# Patient Record
Sex: Male | Born: 1965
Health system: Southern US, Community
[De-identification: ages and names within clinical notes are randomized; demographics above are authoritative.]

## PROBLEM LIST (undated history)

## (undated) DIAGNOSIS — T7840XA Allergy, unspecified, initial encounter: Secondary | ICD-10-CM

## (undated) DIAGNOSIS — J45909 Unspecified asthma, uncomplicated: Secondary | ICD-10-CM

## (undated) DIAGNOSIS — F32A Depression, unspecified: Secondary | ICD-10-CM

## (undated) DIAGNOSIS — E785 Hyperlipidemia, unspecified: Secondary | ICD-10-CM

## (undated) DIAGNOSIS — K859 Acute pancreatitis without necrosis or infection, unspecified: Secondary | ICD-10-CM

## (undated) DIAGNOSIS — K219 Gastro-esophageal reflux disease without esophagitis: Secondary | ICD-10-CM

## (undated) DIAGNOSIS — I10 Essential (primary) hypertension: Secondary | ICD-10-CM

## (undated) DIAGNOSIS — F259 Schizoaffective disorder, unspecified: Secondary | ICD-10-CM

## (undated) DIAGNOSIS — E119 Type 2 diabetes mellitus without complications: Secondary | ICD-10-CM

## (undated) DIAGNOSIS — F329 Major depressive disorder, single episode, unspecified: Secondary | ICD-10-CM

## (undated) DIAGNOSIS — F419 Anxiety disorder, unspecified: Secondary | ICD-10-CM

## (undated) DIAGNOSIS — M1990 Unspecified osteoarthritis, unspecified site: Secondary | ICD-10-CM

## (undated) DIAGNOSIS — R7303 Prediabetes: Secondary | ICD-10-CM

## (undated) DIAGNOSIS — R42 Dizziness and giddiness: Secondary | ICD-10-CM

## (undated) DIAGNOSIS — R0600 Dyspnea, unspecified: Secondary | ICD-10-CM

## (undated) DIAGNOSIS — J189 Pneumonia, unspecified organism: Secondary | ICD-10-CM

## (undated) DIAGNOSIS — J80 Acute respiratory distress syndrome: Secondary | ICD-10-CM

## (undated) DIAGNOSIS — F319 Bipolar disorder, unspecified: Secondary | ICD-10-CM

## (undated) DIAGNOSIS — J449 Chronic obstructive pulmonary disease, unspecified: Secondary | ICD-10-CM

## (undated) DIAGNOSIS — K760 Fatty (change of) liver, not elsewhere classified: Secondary | ICD-10-CM

## (undated) DIAGNOSIS — I2699 Other pulmonary embolism without acute cor pulmonale: Secondary | ICD-10-CM

## (undated) DIAGNOSIS — E7800 Pure hypercholesterolemia, unspecified: Secondary | ICD-10-CM

## (undated) DIAGNOSIS — N189 Chronic kidney disease, unspecified: Secondary | ICD-10-CM

## (undated) DIAGNOSIS — H269 Unspecified cataract: Secondary | ICD-10-CM

## (undated) DIAGNOSIS — F1010 Alcohol abuse, uncomplicated: Secondary | ICD-10-CM

## (undated) HISTORY — DX: Allergy, unspecified, initial encounter: T78.40XA

## (undated) HISTORY — DX: Anxiety disorder, unspecified: F41.9

## (undated) HISTORY — PX: HERNIA REPAIR: SHX51

## (undated) HISTORY — PX: COLONOSCOPY: SHX174

## (undated) HISTORY — PX: EYE SURGERY: SHX253

## (undated) HISTORY — PX: UPPER GI ENDOSCOPY: SHX6162

## (undated) HISTORY — DX: Chronic kidney disease, unspecified: N18.9

## (undated) HISTORY — PX: STENT REMOVAL: SHX6421

## (undated) HISTORY — DX: Unspecified cataract: H26.9

---
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
## 1999-06-21 ENCOUNTER — Inpatient Hospital Stay (HOSPITAL_COMMUNITY): Admission: AD | Admit: 1999-06-21 | Discharge: 1999-06-28 | Payer: Self-pay | Admitting: *Deleted

## 1999-06-22 ENCOUNTER — Encounter: Payer: Self-pay | Admitting: *Deleted

## 2004-09-05 ENCOUNTER — Ambulatory Visit: Payer: Self-pay | Admitting: Internal Medicine

## 2004-09-20 ENCOUNTER — Inpatient Hospital Stay: Payer: Self-pay | Admitting: Internal Medicine

## 2004-10-01 ENCOUNTER — Other Ambulatory Visit: Payer: Self-pay

## 2004-10-01 ENCOUNTER — Inpatient Hospital Stay: Payer: Self-pay | Admitting: Internal Medicine

## 2004-10-02 ENCOUNTER — Inpatient Hospital Stay: Payer: Self-pay | Admitting: Psychiatry

## 2004-10-05 ENCOUNTER — Ambulatory Visit: Payer: Self-pay | Admitting: Unknown Physician Specialty

## 2004-11-02 ENCOUNTER — Ambulatory Visit: Payer: Self-pay | Admitting: Internal Medicine

## 2004-11-05 ENCOUNTER — Ambulatory Visit: Payer: Self-pay | Admitting: Unknown Physician Specialty

## 2004-11-30 ENCOUNTER — Ambulatory Visit: Payer: Self-pay | Admitting: Internal Medicine

## 2006-09-19 ENCOUNTER — Ambulatory Visit: Payer: Self-pay | Admitting: Internal Medicine

## 2007-01-26 ENCOUNTER — Emergency Department: Payer: Self-pay | Admitting: Emergency Medicine

## 2008-09-01 ENCOUNTER — Emergency Department: Payer: Self-pay | Admitting: Emergency Medicine

## 2008-09-29 ENCOUNTER — Ambulatory Visit: Payer: Self-pay | Admitting: Internal Medicine

## 2010-06-24 ENCOUNTER — Emergency Department: Payer: Self-pay | Admitting: Emergency Medicine

## 2011-07-20 ENCOUNTER — Ambulatory Visit: Payer: Self-pay | Admitting: Internal Medicine

## 2012-04-22 ENCOUNTER — Emergency Department: Payer: Self-pay | Admitting: Emergency Medicine

## 2012-04-22 LAB — URINALYSIS, COMPLETE
Blood: NEGATIVE
Glucose,UR: NEGATIVE mg/dL (ref 0–75)
Leukocyte Esterase: NEGATIVE
Protein: NEGATIVE

## 2012-04-22 LAB — COMPREHENSIVE METABOLIC PANEL
Albumin: 4.2 g/dL (ref 3.4–5.0)
Alkaline Phosphatase: 151 U/L — ABNORMAL HIGH (ref 50–136)
BUN: 5 mg/dL — ABNORMAL LOW (ref 7–18)
Calcium, Total: 9.3 mg/dL (ref 8.5–10.1)
Chloride: 101 mmol/L (ref 98–107)
EGFR (African American): >60
Glucose: 111 mg/dL — ABNORMAL HIGH (ref 65–99)
Osmolality: 274 (ref 275–301)
SGOT(AST): 28 U/L (ref 15–37)
Total Protein: 8.5 g/dL — ABNORMAL HIGH (ref 6.4–8.2)

## 2012-04-22 LAB — CBC
HCT: 48.3 % (ref 40.0–52.0)
HGB: 16.7 g/dL (ref 13.0–18.0)
MCH: 33.1 pg (ref 26.0–34.0)
MCV: 96 fL (ref 80–100)
Platelet: 209 10*3/uL (ref 150–440)
RDW: 14.2 % (ref 11.5–14.5)

## 2012-04-22 LAB — LIPASE, BLOOD: Lipase: 177 U/L (ref 73–393)

## 2012-04-25 ENCOUNTER — Emergency Department: Payer: Self-pay | Admitting: Emergency Medicine

## 2012-04-25 LAB — CBC WITH DIFFERENTIAL/PLATELET
Basophil #: 0 10*3/uL (ref 0.0–0.1)
Eosinophil #: 0.1 10*3/uL (ref 0.0–0.7)
HCT: 47 % (ref 40.0–52.0)
HGB: 15.9 g/dL (ref 13.0–18.0)
Lymphocyte %: 14 %
MCH: 32.8 pg (ref 26.0–34.0)
MCHC: 33.9 g/dL (ref 32.0–36.0)
Monocyte #: 0.5 x10 3/mm (ref 0.2–1.0)
Monocyte %: 4.9 %
Neutrophil %: 79.6 %
Platelet: 182 10*3/uL (ref 150–440)
RDW: 14 % (ref 11.5–14.5)

## 2012-04-25 LAB — COMPREHENSIVE METABOLIC PANEL
Alkaline Phosphatase: 126 U/L (ref 50–136)
BUN: 6 mg/dL — ABNORMAL LOW (ref 7–18)
Calcium, Total: 9.1 mg/dL (ref 8.5–10.1)
Co2: 24 mmol/L (ref 21–32)
Creatinine: 0.73 mg/dL (ref 0.60–1.30)
EGFR (African American): >60
Glucose: 106 mg/dL — ABNORMAL HIGH (ref 65–99)
Potassium: 3.9 mmol/L (ref 3.5–5.1)
SGOT(AST): 39 U/L — ABNORMAL HIGH (ref 15–37)
SGPT (ALT): 44 U/L
Sodium: 135 mmol/L — ABNORMAL LOW (ref 136–145)

## 2012-04-25 LAB — LIPASE, BLOOD: Lipase: 168 U/L (ref 73–393)

## 2012-05-09 ENCOUNTER — Ambulatory Visit: Payer: Self-pay | Admitting: Unknown Physician Specialty

## 2012-05-12 LAB — PATHOLOGY REPORT

## 2012-06-30 ENCOUNTER — Ambulatory Visit: Payer: Self-pay | Admitting: Unknown Physician Specialty

## 2012-07-01 LAB — PATHOLOGY REPORT

## 2012-09-01 ENCOUNTER — Emergency Department: Payer: Self-pay | Admitting: Emergency Medicine

## 2012-09-01 LAB — ACETAMINOPHEN LEVEL: Acetaminophen: <2 ug/mL

## 2012-09-01 LAB — DRUG SCREEN, URINE

## 2012-09-01 LAB — SALICYLATE LEVEL: Salicylates, Serum: 3.6 mg/dL — ABNORMAL HIGH

## 2012-09-01 LAB — COMPREHENSIVE METABOLIC PANEL
Albumin: 4.1 g/dL (ref 3.4–5.0)
Alkaline Phosphatase: 116 U/L (ref 50–136)
Anion Gap: 10 (ref 7–16)
BUN: 7 mg/dL (ref 7–18)
Bilirubin,Total: 0.3 mg/dL (ref 0.2–1.0)
Calcium, Total: 8.8 mg/dL (ref 8.5–10.1)
Chloride: 104 mmol/L (ref 98–107)
Co2: 26 mmol/L (ref 21–32)
Creatinine: 0.75 mg/dL (ref 0.60–1.30)
EGFR (African American): >60
EGFR (Non-African Amer.): >60
Glucose: 97 mg/dL (ref 65–99)
Osmolality: 277 (ref 275–301)
Potassium: 3.6 mmol/L (ref 3.5–5.1)
SGOT(AST): 22 U/L (ref 15–37)
SGPT (ALT): 31 U/L (ref 12–78)
Sodium: 140 mmol/L (ref 136–145)
Total Protein: 7.5 g/dL (ref 6.4–8.2)

## 2012-09-01 LAB — ETHANOL
Ethanol %: <0.003 % (ref 0.000–0.080)
Ethanol: <3 mg/dL

## 2012-09-01 LAB — CBC
HCT: 46.8 % (ref 40.0–52.0)
HGB: 16.7 g/dL (ref 13.0–18.0)
MCH: 33.5 pg (ref 26.0–34.0)
MCHC: 35.7 g/dL (ref 32.0–36.0)
MCV: 94 fL (ref 80–100)
Platelet: 171 10*3/uL (ref 150–440)
RBC: 4.99 10*6/uL (ref 4.40–5.90)
RDW: 13.1 % (ref 11.5–14.5)
WBC: 7.9 10*3/uL (ref 3.8–10.6)

## 2012-09-01 LAB — TSH: Thyroid Stimulating Horm: 1.41 u[IU]/mL

## 2012-10-27 ENCOUNTER — Emergency Department: Payer: Self-pay | Admitting: Emergency Medicine

## 2012-10-27 LAB — URINALYSIS, COMPLETE
Bacteria: NONE SEEN
Bilirubin,UR: NEGATIVE
Glucose,UR: NEGATIVE mg/dL (ref 0–75)
Ketone: NEGATIVE
Leukocyte Esterase: NEGATIVE
RBC,UR: <1 /HPF (ref 0–5)
Squamous Epithelial: <1
WBC UR: <1 /HPF (ref 0–5)

## 2012-10-27 LAB — COMPREHENSIVE METABOLIC PANEL
Albumin: 4.1 g/dL (ref 3.4–5.0)
Alkaline Phosphatase: 140 U/L — ABNORMAL HIGH (ref 50–136)
Anion Gap: 8 (ref 7–16)
BUN: 6 mg/dL — ABNORMAL LOW (ref 7–18)
Bilirubin,Total: 0.3 mg/dL (ref 0.2–1.0)
Calcium, Total: 9.1 mg/dL (ref 8.5–10.1)
Chloride: 105 mmol/L (ref 98–107)
Creatinine: 0.59 mg/dL — ABNORMAL LOW (ref 0.60–1.30)
Glucose: 93 mg/dL (ref 65–99)
Osmolality: 275 (ref 275–301)
Sodium: 139 mmol/L (ref 136–145)
Total Protein: 7.4 g/dL (ref 6.4–8.2)

## 2012-10-27 LAB — CBC
HCT: 44.7 % (ref 40.0–52.0)
HGB: 15.9 g/dL (ref 13.0–18.0)
MCH: 33.7 pg (ref 26.0–34.0)
MCHC: 35.5 g/dL (ref 32.0–36.0)
MCV: 95 fL (ref 80–100)
Platelet: 179 10*3/uL (ref 150–440)
RBC: 4.72 10*6/uL (ref 4.40–5.90)
RDW: 14 % (ref 11.5–14.5)
WBC: 9.4 10*3/uL (ref 3.8–10.6)

## 2012-10-27 LAB — MAGNESIUM: Magnesium: 1.9 mg/dL

## 2012-12-04 ENCOUNTER — Ambulatory Visit: Payer: Self-pay | Admitting: Orthopedic Surgery

## 2012-12-24 ENCOUNTER — Ambulatory Visit: Payer: Self-pay | Admitting: Physician Assistant

## 2013-02-18 ENCOUNTER — Emergency Department: Payer: Self-pay

## 2013-02-18 LAB — LIPASE, BLOOD: Lipase: 123 U/L (ref 73–393)

## 2013-02-18 LAB — BASIC METABOLIC PANEL
Anion Gap: 6 — ABNORMAL LOW (ref 7–16)
BUN: 9 mg/dL (ref 7–18)
Co2: 25 mmol/L (ref 21–32)
Creatinine: 0.65 mg/dL (ref 0.60–1.30)
EGFR (Non-African Amer.): >60
Glucose: 106 mg/dL — ABNORMAL HIGH (ref 65–99)
Osmolality: 269 (ref 275–301)
Potassium: 4 mmol/L (ref 3.5–5.1)

## 2013-02-18 LAB — TROPONIN I: Troponin-I: <0.02 ng/mL

## 2013-02-18 LAB — CK TOTAL AND CKMB (NOT AT ARMC)
CK, Total: 59 U/L (ref 35–232)
CK-MB: <0.5 ng/mL — ABNORMAL LOW (ref 0.5–3.6)

## 2013-02-18 LAB — CBC
MCH: 31.7 pg (ref 26.0–34.0)
RBC: 4.7 10*6/uL (ref 4.40–5.90)
RDW: 13.9 % (ref 11.5–14.5)
WBC: 12 10*3/uL — ABNORMAL HIGH (ref 3.8–10.6)

## 2013-05-31 ENCOUNTER — Emergency Department: Payer: Self-pay | Admitting: Emergency Medicine

## 2013-05-31 LAB — CBC
HCT: 38.1 % — ABNORMAL LOW (ref 40.0–52.0)
MCH: 31.1 pg (ref 26.0–34.0)
MCHC: 34.9 g/dL (ref 32.0–36.0)
MCV: 89 fL (ref 80–100)
Platelet: 207 10*3/uL (ref 150–440)
RBC: 4.27 10*6/uL — ABNORMAL LOW (ref 4.40–5.90)
RDW: 13.7 % (ref 11.5–14.5)
WBC: 11.5 10*3/uL — ABNORMAL HIGH (ref 3.8–10.6)

## 2013-05-31 LAB — TROPONIN I: Troponin-I: <0.02 ng/mL

## 2013-05-31 LAB — COMPREHENSIVE METABOLIC PANEL
Albumin: 3.2 g/dL — ABNORMAL LOW (ref 3.4–5.0)
Alkaline Phosphatase: 132 U/L (ref 50–136)
BUN: 4 mg/dL — ABNORMAL LOW (ref 7–18)
Bilirubin,Total: 0.3 mg/dL (ref 0.2–1.0)
Chloride: 102 mmol/L (ref 98–107)
Co2: 25 mmol/L (ref 21–32)
Creatinine: 0.76 mg/dL (ref 0.60–1.30)
EGFR (African American): >60
EGFR (Non-African Amer.): >60
Glucose: 112 mg/dL — ABNORMAL HIGH (ref 65–99)
SGOT(AST): 25 U/L (ref 15–37)
SGPT (ALT): 28 U/L (ref 12–78)
Sodium: 135 mmol/L — ABNORMAL LOW (ref 136–145)

## 2013-05-31 LAB — TSH: Thyroid Stimulating Horm: 1.83 u[IU]/mL

## 2013-06-22 ENCOUNTER — Emergency Department: Payer: Self-pay | Admitting: Emergency Medicine

## 2013-06-22 LAB — COMPREHENSIVE METABOLIC PANEL
Albumin: 3.8 g/dL (ref 3.4–5.0)
Calcium, Total: 8.7 mg/dL (ref 8.5–10.1)
Co2: 27 mmol/L (ref 21–32)
EGFR (Non-African Amer.): >60
Glucose: 166 mg/dL — ABNORMAL HIGH (ref 65–99)
SGOT(AST): 39 U/L — ABNORMAL HIGH (ref 15–37)
SGPT (ALT): 50 U/L (ref 12–78)

## 2013-06-22 LAB — CK TOTAL AND CKMB (NOT AT ARMC)
CK, Total: 106 U/L (ref 35–232)
CK-MB: <0.5 ng/mL — ABNORMAL LOW (ref 0.5–3.6)

## 2013-06-22 LAB — CBC
HCT: 44.1 % (ref 40.0–52.0)
HGB: 15.2 g/dL (ref 13.0–18.0)
MCH: 31.3 pg (ref 26.0–34.0)
MCHC: 34.5 g/dL (ref 32.0–36.0)
RBC: 4.86 10*6/uL (ref 4.40–5.90)
WBC: 8.6 10*3/uL (ref 3.8–10.6)

## 2013-06-22 LAB — PRO B NATRIURETIC PEPTIDE: B-Type Natriuretic Peptide: 15 pg/mL (ref 0–125)

## 2013-08-02 ENCOUNTER — Emergency Department: Payer: Self-pay | Admitting: Emergency Medicine

## 2013-08-02 LAB — CBC
HCT: 41 % (ref 40.0–52.0)
HGB: 14.1 g/dL (ref 13.0–18.0)
MCV: 92 fL (ref 80–100)
RDW: 14.3 % (ref 11.5–14.5)
WBC: 14.3 10*3/uL — ABNORMAL HIGH (ref 3.8–10.6)

## 2013-08-02 LAB — BASIC METABOLIC PANEL
Calcium, Total: 8.7 mg/dL (ref 8.5–10.1)
Chloride: 102 mmol/L (ref 98–107)
Creatinine: 0.85 mg/dL (ref 0.60–1.30)
EGFR (Non-African Amer.): >60
Glucose: 253 mg/dL — ABNORMAL HIGH (ref 65–99)
Potassium: 3.7 mmol/L (ref 3.5–5.1)

## 2013-08-02 LAB — TROPONIN I: Troponin-I: <0.02 ng/mL

## 2013-08-03 ENCOUNTER — Emergency Department: Payer: Self-pay | Admitting: Emergency Medicine

## 2013-08-03 LAB — TROPONIN I: Troponin-I: <0.02 ng/mL

## 2013-08-10 ENCOUNTER — Emergency Department: Payer: Self-pay | Admitting: Emergency Medicine

## 2013-08-10 LAB — CBC
HGB: 13.9 g/dL (ref 13.0–18.0)
MCH: 31.2 pg (ref 26.0–34.0)
MCHC: 33.9 g/dL (ref 32.0–36.0)
RBC: 4.46 10*6/uL (ref 4.40–5.90)
RDW: 14.5 % (ref 11.5–14.5)
WBC: 15.1 10*3/uL — ABNORMAL HIGH (ref 3.8–10.6)

## 2013-08-10 LAB — BASIC METABOLIC PANEL
Anion Gap: 8 (ref 7–16)
Creatinine: 1.08 mg/dL (ref 0.60–1.30)
EGFR (Non-African Amer.): >60
Osmolality: 275 (ref 275–301)
Sodium: 134 mmol/L — ABNORMAL LOW (ref 136–145)

## 2014-03-10 ENCOUNTER — Emergency Department: Payer: Self-pay

## 2014-03-10 LAB — CBC WITH DIFFERENTIAL/PLATELET
BASOS ABS: 0 10*3/uL (ref 0.0–0.1)
BASOS PCT: 0.6 %
EOS ABS: 0.1 10*3/uL (ref 0.0–0.7)
EOS PCT: 0.9 %
HCT: 44 % (ref 40.0–52.0)
HGB: 14.9 g/dL (ref 13.0–18.0)
Lymphocyte #: 1.1 10*3/uL (ref 1.0–3.6)
Lymphocyte %: 12.9 %
MCH: 32 pg (ref 26.0–34.0)
MCHC: 33.8 g/dL (ref 32.0–36.0)
MCV: 95 fL (ref 80–100)
Monocyte #: 0.4 x10 3/mm (ref 0.2–1.0)
Monocyte %: 4.9 %
Neutrophil #: 7.1 10*3/uL — ABNORMAL HIGH (ref 1.4–6.5)
Neutrophil %: 80.7 %
PLATELETS: 162 10*3/uL (ref 150–440)
RBC: 4.64 10*6/uL (ref 4.40–5.90)
RDW: 13.6 % (ref 11.5–14.5)
WBC: 8.8 10*3/uL (ref 3.8–10.6)

## 2014-03-10 LAB — COMPREHENSIVE METABOLIC PANEL
ALBUMIN: 4.1 g/dL (ref 3.4–5.0)
ALK PHOS: 115 U/L
Anion Gap: 8 (ref 7–16)
BUN: 13 mg/dL (ref 7–18)
Bilirubin,Total: 0.3 mg/dL (ref 0.2–1.0)
CALCIUM: 9.1 mg/dL (ref 8.5–10.1)
Chloride: 100 mmol/L (ref 98–107)
Co2: 28 mmol/L (ref 21–32)
Creatinine: 1.19 mg/dL (ref 0.60–1.30)
EGFR (Non-African Amer.): >60
GLUCOSE: 174 mg/dL — AB (ref 65–99)
Osmolality: 276 (ref 275–301)
Potassium: 3.9 mmol/L (ref 3.5–5.1)
SGOT(AST): 37 U/L (ref 15–37)
SGPT (ALT): 53 U/L (ref 12–78)
Sodium: 136 mmol/L (ref 136–145)
Total Protein: 7.3 g/dL (ref 6.4–8.2)

## 2014-03-10 LAB — URINALYSIS, COMPLETE
BLOOD: NEGATIVE
Bacteria: NONE SEEN
Bilirubin,UR: NEGATIVE
Glucose,UR: NEGATIVE mg/dL (ref 0–75)
Leukocyte Esterase: NEGATIVE
Nitrite: NEGATIVE
PH: 6 (ref 4.5–8.0)
Protein: NEGATIVE
Specific Gravity: 1.021 (ref 1.003–1.030)
Squamous Epithelial: NONE SEEN
WBC UR: 2 /HPF (ref 0–5)

## 2014-03-10 LAB — LIPASE, BLOOD: LIPASE: 370 U/L (ref 73–393)

## 2014-03-18 ENCOUNTER — Ambulatory Visit: Payer: Self-pay | Admitting: Unknown Physician Specialty

## 2014-05-03 ENCOUNTER — Ambulatory Visit: Payer: Self-pay | Admitting: Unknown Physician Specialty

## 2014-05-05 ENCOUNTER — Ambulatory Visit: Payer: Self-pay | Admitting: Unknown Physician Specialty

## 2014-05-27 ENCOUNTER — Inpatient Hospital Stay: Payer: Self-pay | Admitting: Internal Medicine

## 2014-05-27 LAB — CBC WITH DIFFERENTIAL/PLATELET
Basophil #: 0.1 10*3/uL (ref 0.0–0.1)
Basophil %: 0.4 %
EOS ABS: 0 10*3/uL (ref 0.0–0.7)
Eosinophil %: 0 %
HCT: 50.3 % (ref 40.0–52.0)
HGB: 16.6 g/dL (ref 13.0–18.0)
Lymphocyte #: 0.8 10*3/uL — ABNORMAL LOW (ref 1.0–3.6)
Lymphocyte %: 4.5 %
MCH: 31.9 pg (ref 26.0–34.0)
MCHC: 32.9 g/dL (ref 32.0–36.0)
MCV: 97 fL (ref 80–100)
MONO ABS: 0.8 x10 3/mm (ref 0.2–1.0)
MONOS PCT: 4.5 %
NEUTROS PCT: 90.6 %
Neutrophil #: 15.4 10*3/uL — ABNORMAL HIGH (ref 1.4–6.5)
PLATELETS: 220 10*3/uL (ref 150–440)
RBC: 5.19 10*6/uL (ref 4.40–5.90)
RDW: 14 % (ref 11.5–14.5)
WBC: 16.9 10*3/uL — ABNORMAL HIGH (ref 3.8–10.6)

## 2014-05-27 LAB — COMPREHENSIVE METABOLIC PANEL
ALBUMIN: 4.2 g/dL (ref 3.4–5.0)
ALK PHOS: 129 U/L — AB
Anion Gap: 11 (ref 7–16)
BILIRUBIN TOTAL: 0.6 mg/dL (ref 0.2–1.0)
BUN: 14 mg/dL (ref 7–18)
CO2: 25 mmol/L (ref 21–32)
CREATININE: 0.78 mg/dL (ref 0.60–1.30)
Calcium, Total: 9.1 mg/dL (ref 8.5–10.1)
Chloride: 97 mmol/L — ABNORMAL LOW (ref 98–107)
EGFR (Non-African Amer.): >60
Glucose: 153 mg/dL — ABNORMAL HIGH (ref 65–99)
OSMOLALITY: 270 (ref 275–301)
Potassium: 3.7 mmol/L (ref 3.5–5.1)
SGOT(AST): 38 U/L — ABNORMAL HIGH (ref 15–37)
SGPT (ALT): 64 U/L — ABNORMAL HIGH
Sodium: 133 mmol/L — ABNORMAL LOW (ref 136–145)
TOTAL PROTEIN: 7.8 g/dL (ref 6.4–8.2)

## 2014-05-27 LAB — URINALYSIS, COMPLETE
BACTERIA: NONE SEEN
Blood: NEGATIVE
GLUCOSE, UR: NEGATIVE mg/dL (ref 0–75)
Leukocyte Esterase: NEGATIVE
Nitrite: NEGATIVE
PH: 6 (ref 4.5–8.0)
RBC,UR: 3 /HPF (ref 0–5)
SPECIFIC GRAVITY: 1.042 (ref 1.003–1.030)
Squamous Epithelial: NONE SEEN

## 2014-05-27 LAB — TROPONIN I: Troponin-I: <0.02 ng/mL

## 2014-05-27 LAB — LIPASE, BLOOD: Lipase: 4127 U/L — ABNORMAL HIGH (ref 73–393)

## 2014-05-28 LAB — CBC WITH DIFFERENTIAL/PLATELET
Basophil #: 0 10*3/uL (ref 0.0–0.1)
Basophil #: 0.1 10*3/uL (ref 0.0–0.1)
Basophil %: 0.2 %
Basophil %: 0.4 %
EOS ABS: 0 10*3/uL (ref 0.0–0.7)
EOS PCT: 0 %
Eosinophil #: 0 10*3/uL (ref 0.0–0.7)
Eosinophil %: 0.2 %
HCT: 42.2 % (ref 40.0–52.0)
HCT: 43.6 % (ref 40.0–52.0)
HGB: 14 g/dL (ref 13.0–18.0)
HGB: 14.4 g/dL (ref 13.0–18.0)
LYMPHS ABS: 0.8 10*3/uL — AB (ref 1.0–3.6)
LYMPHS ABS: 1.1 10*3/uL (ref 1.0–3.6)
LYMPHS PCT: 6.7 %
Lymphocyte %: 8.8 %
MCH: 32.3 pg (ref 26.0–34.0)
MCH: 31.7 pg (ref 26.0–34.0)
MCHC: 33.2 g/dL (ref 32.0–36.0)
MCHC: 32.9 g/dL (ref 32.0–36.0)
MCV: 97 fL (ref 80–100)
MCV: 96 fL (ref 80–100)
MONO ABS: 0.5 x10 3/mm (ref 0.2–1.0)
MONOS PCT: 4.6 %
MONOS PCT: 4.2 %
Monocyte #: 0.5 x10 3/mm (ref 0.2–1.0)
NEUTROS PCT: 86.4 %
Neutrophil #: 10.2 10*3/uL — ABNORMAL HIGH (ref 1.4–6.5)
Neutrophil #: 10.4 10*3/uL — ABNORMAL HIGH (ref 1.4–6.5)
Neutrophil %: 88.5 %
PLATELETS: 151 10*3/uL (ref 150–440)
Platelet: 145 10*3/uL — ABNORMAL LOW (ref 150–440)
RBC: 4.35 10*6/uL — AB (ref 4.40–5.90)
RBC: 4.53 10*6/uL (ref 4.40–5.90)
RDW: 14.5 % (ref 11.5–14.5)
RDW: 14.2 % (ref 11.5–14.5)
WBC: 11.5 10*3/uL — AB (ref 3.8–10.6)
WBC: 12 10*3/uL — AB (ref 3.8–10.6)

## 2014-05-28 LAB — BASIC METABOLIC PANEL
ANION GAP: 7 (ref 7–16)
ANION GAP: 8 (ref 7–16)
BUN: 12 mg/dL (ref 7–18)
BUN: 9 mg/dL (ref 7–18)
CHLORIDE: 100 mmol/L (ref 98–107)
CO2: 24 mmol/L (ref 21–32)
CREATININE: 0.79 mg/dL (ref 0.60–1.30)
CREATININE: 0.5 mg/dL — AB (ref 0.60–1.30)
Calcium, Total: 7.9 mg/dL — ABNORMAL LOW (ref 8.5–10.1)
Calcium, Total: 7.9 mg/dL — ABNORMAL LOW (ref 8.5–10.1)
Chloride: 102 mmol/L (ref 98–107)
Co2: 29 mmol/L (ref 21–32)
EGFR (African American): >60
EGFR (Non-African Amer.): >60
Glucose: 115 mg/dL — ABNORMAL HIGH (ref 65–99)
Glucose: 100 mg/dL — ABNORMAL HIGH (ref 65–99)
Osmolality: 273 (ref 275–301)
Osmolality: 267 (ref 275–301)
POTASSIUM: 3.6 mmol/L (ref 3.5–5.1)
Potassium: 3.9 mmol/L (ref 3.5–5.1)
Sodium: 136 mmol/L (ref 136–145)
Sodium: 134 mmol/L — ABNORMAL LOW (ref 136–145)

## 2014-05-28 LAB — LIPID PANEL
Cholesterol: 113 mg/dL (ref 0–200)
HDL: 30 mg/dL — AB (ref 40–60)
Ldl Cholesterol, Calc: 46 mg/dL (ref 0–100)
Triglycerides: 184 mg/dL (ref 0–200)
VLDL CHOLESTEROL, CALC: 37 mg/dL (ref 5–40)

## 2014-05-28 LAB — LIPASE, BLOOD: LIPASE: 3359 U/L — AB (ref 73–393)

## 2014-05-28 LAB — MAGNESIUM: MAGNESIUM: 1.8 mg/dL

## 2014-05-29 LAB — HEPATIC FUNCTION PANEL A (ARMC)
ALT: 34 U/L
Albumin: 3 g/dL — ABNORMAL LOW (ref 3.4–5.0)
Alkaline Phosphatase: 90 U/L
BILIRUBIN DIRECT: 0.2 mg/dL (ref 0.00–0.20)
Bilirubin,Total: 0.8 mg/dL (ref 0.2–1.0)
SGOT(AST): 26 U/L (ref 15–37)
TOTAL PROTEIN: 6.8 g/dL (ref 6.4–8.2)

## 2014-05-29 LAB — CBC WITH DIFFERENTIAL/PLATELET
BASOS ABS: 0 10*3/uL (ref 0.0–0.1)
Basophil %: 0.2 %
EOS ABS: 0 10*3/uL (ref 0.0–0.7)
EOS PCT: 0.2 %
HCT: 42.6 % (ref 40.0–52.0)
HGB: 14.5 g/dL (ref 13.0–18.0)
Lymphocyte #: 1.1 10*3/uL (ref 1.0–3.6)
Lymphocyte %: 8.3 %
MCH: 32.7 pg (ref 26.0–34.0)
MCHC: 34 g/dL (ref 32.0–36.0)
MCV: 96 fL (ref 80–100)
MONO ABS: 0.7 x10 3/mm (ref 0.2–1.0)
MONOS PCT: 5.1 %
Neutrophil #: 11.7 10*3/uL — ABNORMAL HIGH (ref 1.4–6.5)
Neutrophil %: 86.2 %
Platelet: 145 10*3/uL — ABNORMAL LOW (ref 150–440)
RBC: 4.43 10*6/uL (ref 4.40–5.90)
RDW: 14.4 % (ref 11.5–14.5)
WBC: 13.6 10*3/uL — ABNORMAL HIGH (ref 3.8–10.6)

## 2014-05-29 LAB — MAGNESIUM: Magnesium: 1.8 mg/dL

## 2014-05-29 LAB — BASIC METABOLIC PANEL
ANION GAP: 10 (ref 7–16)
BUN: 6 mg/dL — ABNORMAL LOW (ref 7–18)
CHLORIDE: 99 mmol/L (ref 98–107)
Calcium, Total: 8.3 mg/dL — ABNORMAL LOW (ref 8.5–10.1)
Co2: 23 mmol/L (ref 21–32)
Creatinine: 0.7 mg/dL (ref 0.60–1.30)
EGFR (African American): >60
EGFR (Non-African Amer.): >60
Glucose: 87 mg/dL (ref 65–99)
Osmolality: 261 (ref 275–301)
Potassium: 3.3 mmol/L — ABNORMAL LOW (ref 3.5–5.1)
SODIUM: 132 mmol/L — AB (ref 136–145)

## 2014-05-30 LAB — LIPASE, BLOOD: Lipase: 529 U/L — ABNORMAL HIGH (ref 73–393)

## 2014-05-30 LAB — BASIC METABOLIC PANEL
ANION GAP: 9 (ref 7–16)
BUN: 4 mg/dL — AB (ref 7–18)
CHLORIDE: 101 mmol/L (ref 98–107)
CREATININE: 0.57 mg/dL — AB (ref 0.60–1.30)
Calcium, Total: 8.1 mg/dL — ABNORMAL LOW (ref 8.5–10.1)
Co2: 24 mmol/L (ref 21–32)
GLUCOSE: 75 mg/dL (ref 65–99)
Osmolality: 264 (ref 275–301)
Potassium: 3.6 mmol/L (ref 3.5–5.1)
Sodium: 134 mmol/L — ABNORMAL LOW (ref 136–145)

## 2014-05-30 LAB — CBC WITH DIFFERENTIAL/PLATELET
Basophil #: 0 10*3/uL (ref 0.0–0.1)
Basophil %: 0.2 %
EOS ABS: 0 10*3/uL (ref 0.0–0.7)
EOS PCT: 0.2 %
HCT: 36.3 % — ABNORMAL LOW (ref 40.0–52.0)
HGB: 12.3 g/dL — ABNORMAL LOW (ref 13.0–18.0)
LYMPHS ABS: 1 10*3/uL (ref 1.0–3.6)
LYMPHS PCT: 7.9 %
MCH: 32.8 pg (ref 26.0–34.0)
MCHC: 33.8 g/dL (ref 32.0–36.0)
MCV: 97 fL (ref 80–100)
MONO ABS: 0.9 x10 3/mm (ref 0.2–1.0)
MONOS PCT: 7.1 %
NEUTROS PCT: 84.6 %
Neutrophil #: 11 10*3/uL — ABNORMAL HIGH (ref 1.4–6.5)
PLATELETS: 131 10*3/uL — AB (ref 150–440)
RBC: 3.74 10*6/uL — ABNORMAL LOW (ref 4.40–5.90)
RDW: 13.9 % (ref 11.5–14.5)
WBC: 13 10*3/uL — ABNORMAL HIGH (ref 3.8–10.6)

## 2014-05-31 LAB — CBC WITH DIFFERENTIAL/PLATELET
BASOS ABS: 0 10*3/uL (ref 0.0–0.1)
BASOS PCT: 0.1 %
Eosinophil #: 0 10*3/uL (ref 0.0–0.7)
Eosinophil %: 0.4 %
HCT: 35.8 % — ABNORMAL LOW (ref 40.0–52.0)
HGB: 12 g/dL — AB (ref 13.0–18.0)
LYMPHS ABS: 1.1 10*3/uL (ref 1.0–3.6)
Lymphocyte %: 10.6 %
MCH: 32.6 pg (ref 26.0–34.0)
MCHC: 33.4 g/dL (ref 32.0–36.0)
MCV: 97 fL (ref 80–100)
Monocyte #: 0.8 x10 3/mm (ref 0.2–1.0)
Monocyte %: 8.2 %
NEUTROS ABS: 8.1 10*3/uL — AB (ref 1.4–6.5)
NEUTROS PCT: 80.7 %
Platelet: 153 10*3/uL (ref 150–440)
RBC: 3.68 10*6/uL — ABNORMAL LOW (ref 4.40–5.90)
RDW: 14.2 % (ref 11.5–14.5)
WBC: 10.1 10*3/uL (ref 3.8–10.6)

## 2014-05-31 LAB — BASIC METABOLIC PANEL
Anion Gap: 6 — ABNORMAL LOW (ref 7–16)
BUN: 3 mg/dL — ABNORMAL LOW (ref 7–18)
CALCIUM: 8.3 mg/dL — AB (ref 8.5–10.1)
CHLORIDE: 100 mmol/L (ref 98–107)
Co2: 29 mmol/L (ref 21–32)
Creatinine: 0.67 mg/dL (ref 0.60–1.30)
EGFR (African American): >60
GLUCOSE: 88 mg/dL (ref 65–99)
Osmolality: 266 (ref 275–301)
Potassium: 3.6 mmol/L (ref 3.5–5.1)
Sodium: 135 mmol/L — ABNORMAL LOW (ref 136–145)

## 2014-06-01 LAB — BASIC METABOLIC PANEL
Anion Gap: 10 (ref 7–16)
BUN: 4 mg/dL — ABNORMAL LOW (ref 7–18)
Calcium, Total: 8.4 mg/dL — ABNORMAL LOW (ref 8.5–10.1)
Chloride: 102 mmol/L (ref 98–107)
Co2: 27 mmol/L (ref 21–32)
Creatinine: 0.65 mg/dL (ref 0.60–1.30)
EGFR (African American): >60
EGFR (Non-African Amer.): >60
GLUCOSE: 112 mg/dL — AB (ref 65–99)
Osmolality: 275 (ref 275–301)
POTASSIUM: 3.2 mmol/L — AB (ref 3.5–5.1)
Sodium: 139 mmol/L (ref 136–145)

## 2014-06-01 LAB — HEPATIC FUNCTION PANEL A (ARMC)
Albumin: 2.3 g/dL — ABNORMAL LOW (ref 3.4–5.0)
Alkaline Phosphatase: 132 U/L — ABNORMAL HIGH
Bilirubin, Direct: 0.2 mg/dL (ref 0.00–0.20)
Bilirubin,Total: 0.5 mg/dL (ref 0.2–1.0)
SGOT(AST): 57 U/L — ABNORMAL HIGH (ref 15–37)
SGPT (ALT): 54 U/L
Total Protein: 6.4 g/dL (ref 6.4–8.2)

## 2014-06-01 LAB — CBC WITH DIFFERENTIAL/PLATELET
BASOS PCT: 0.2 %
Basophil #: 0 10*3/uL (ref 0.0–0.1)
Eosinophil #: 0.1 10*3/uL (ref 0.0–0.7)
Eosinophil %: 0.6 %
HCT: 34 % — ABNORMAL LOW (ref 40.0–52.0)
HGB: 11.1 g/dL — ABNORMAL LOW (ref 13.0–18.0)
LYMPHS ABS: 1 10*3/uL (ref 1.0–3.6)
LYMPHS PCT: 10.8 %
MCH: 31.8 pg (ref 26.0–34.0)
MCHC: 32.6 g/dL (ref 32.0–36.0)
MCV: 98 fL (ref 80–100)
MONO ABS: 0.8 x10 3/mm (ref 0.2–1.0)
Monocyte %: 9.2 %
Neutrophil #: 7.1 10*3/uL — ABNORMAL HIGH (ref 1.4–6.5)
Neutrophil %: 79.2 %
Platelet: 165 10*3/uL (ref 150–440)
RBC: 3.48 10*6/uL — AB (ref 4.40–5.90)
RDW: 14 % (ref 11.5–14.5)
WBC: 8.9 10*3/uL (ref 3.8–10.6)

## 2014-06-02 LAB — CBC WITH DIFFERENTIAL/PLATELET
BASOS ABS: 0 10*3/uL (ref 0.0–0.1)
Basophil %: 0.3 %
Eosinophil #: 0.1 10*3/uL (ref 0.0–0.7)
Eosinophil %: 0.6 %
HCT: 33.2 % — ABNORMAL LOW (ref 40.0–52.0)
HGB: 11.3 g/dL — ABNORMAL LOW (ref 13.0–18.0)
LYMPHS ABS: 1.1 10*3/uL (ref 1.0–3.6)
Lymphocyte %: 10.5 %
MCH: 32.7 pg (ref 26.0–34.0)
MCHC: 34 g/dL (ref 32.0–36.0)
MCV: 96 fL (ref 80–100)
Monocyte #: 1.1 x10 3/mm — ABNORMAL HIGH (ref 0.2–1.0)
Monocyte %: 10.6 %
Neutrophil #: 8.1 10*3/uL — ABNORMAL HIGH (ref 1.4–6.5)
Neutrophil %: 78 %
PLATELETS: 206 10*3/uL (ref 150–440)
RBC: 3.45 10*6/uL — AB (ref 4.40–5.90)
RDW: 14.1 % (ref 11.5–14.5)
WBC: 10.4 10*3/uL (ref 3.8–10.6)

## 2014-06-03 LAB — HEPATIC FUNCTION PANEL A (ARMC)
ALK PHOS: 153 U/L — AB
ALT: 71 U/L — AB
Albumin: 2.3 g/dL — ABNORMAL LOW (ref 3.4–5.0)
Bilirubin, Direct: 0.1 mg/dL (ref 0.00–0.20)
Bilirubin,Total: 0.3 mg/dL (ref 0.2–1.0)
SGOT(AST): 44 U/L — ABNORMAL HIGH (ref 15–37)
Total Protein: 6.6 g/dL (ref 6.4–8.2)

## 2014-06-03 LAB — CBC WITH DIFFERENTIAL/PLATELET
BASOS ABS: 0 10*3/uL (ref 0.0–0.1)
BASOS PCT: 0.3 %
Eosinophil #: 0.1 10*3/uL (ref 0.0–0.7)
Eosinophil %: 0.8 %
HCT: 34.1 % — ABNORMAL LOW (ref 40.0–52.0)
HGB: 11.1 g/dL — AB (ref 13.0–18.0)
LYMPHS ABS: 1.3 10*3/uL (ref 1.0–3.6)
Lymphocyte %: 13 %
MCH: 31.9 pg (ref 26.0–34.0)
MCHC: 32.7 g/dL (ref 32.0–36.0)
MCV: 98 fL (ref 80–100)
Monocyte #: 1.1 x10 3/mm — ABNORMAL HIGH (ref 0.2–1.0)
Monocyte %: 10.3 %
NEUTROS PCT: 75.6 %
Neutrophil #: 7.8 10*3/uL — ABNORMAL HIGH (ref 1.4–6.5)
Platelet: 229 10*3/uL (ref 150–440)
RBC: 3.49 10*6/uL — ABNORMAL LOW (ref 4.40–5.90)
RDW: 14.2 % (ref 11.5–14.5)
WBC: 10.4 10*3/uL (ref 3.8–10.6)

## 2014-06-03 LAB — BASIC METABOLIC PANEL
Anion Gap: 10 (ref 7–16)
BUN: 5 mg/dL — AB (ref 7–18)
CHLORIDE: 101 mmol/L (ref 98–107)
Calcium, Total: 8.4 mg/dL — ABNORMAL LOW (ref 8.5–10.1)
Co2: 27 mmol/L (ref 21–32)
Creatinine: 0.65 mg/dL (ref 0.60–1.30)
EGFR (African American): >60
Glucose: 120 mg/dL — ABNORMAL HIGH (ref 65–99)
Osmolality: 274 (ref 275–301)
POTASSIUM: 3.2 mmol/L — AB (ref 3.5–5.1)
SODIUM: 138 mmol/L (ref 136–145)

## 2014-06-05 ENCOUNTER — Inpatient Hospital Stay: Payer: Self-pay | Admitting: Internal Medicine

## 2014-06-05 LAB — COMPREHENSIVE METABOLIC PANEL
ALBUMIN: 2.9 g/dL — AB (ref 3.4–5.0)
Alkaline Phosphatase: 175 U/L — ABNORMAL HIGH
Anion Gap: 6 — ABNORMAL LOW (ref 7–16)
BILIRUBIN TOTAL: 0.4 mg/dL (ref 0.2–1.0)
BUN: 7 mg/dL (ref 7–18)
CALCIUM: 8.9 mg/dL (ref 8.5–10.1)
Chloride: 106 mmol/L (ref 98–107)
Co2: 27 mmol/L (ref 21–32)
Creatinine: 0.81 mg/dL (ref 0.60–1.30)
EGFR (Non-African Amer.): >60
Glucose: 158 mg/dL — ABNORMAL HIGH (ref 65–99)
OSMOLALITY: 279 (ref 275–301)
POTASSIUM: 3.9 mmol/L (ref 3.5–5.1)
SGOT(AST): 29 U/L (ref 15–37)
SGPT (ALT): 82 U/L — ABNORMAL HIGH
Sodium: 139 mmol/L (ref 136–145)
Total Protein: 7.6 g/dL (ref 6.4–8.2)

## 2014-06-05 LAB — CBC WITH DIFFERENTIAL/PLATELET
Basophil #: 0.2 10*3/uL — ABNORMAL HIGH (ref 0.0–0.1)
Basophil %: 1.8 %
Eosinophil #: 0.1 10*3/uL (ref 0.0–0.7)
Eosinophil %: 0.9 %
HCT: 42.1 % (ref 40.0–52.0)
HGB: 13.6 g/dL (ref 13.0–18.0)
LYMPHS ABS: 1.6 10*3/uL (ref 1.0–3.6)
LYMPHS PCT: 13.2 %
MCH: 31 pg (ref 26.0–34.0)
MCHC: 32.3 g/dL (ref 32.0–36.0)
MCV: 96 fL (ref 80–100)
MONO ABS: 0.9 x10 3/mm (ref 0.2–1.0)
Monocyte %: 7.6 %
Neutrophil #: 9.3 10*3/uL — ABNORMAL HIGH (ref 1.4–6.5)
Neutrophil %: 76.5 %
Platelet: 382 10*3/uL (ref 150–440)
RBC: 4.38 10*6/uL — ABNORMAL LOW (ref 4.40–5.90)
RDW: 14.2 % (ref 11.5–14.5)
WBC: 12.1 10*3/uL — ABNORMAL HIGH (ref 3.8–10.6)

## 2014-06-05 LAB — URINALYSIS, COMPLETE
BILIRUBIN, UR: NEGATIVE
Blood: NEGATIVE
Glucose,UR: NEGATIVE mg/dL (ref 0–75)
Ketone: NEGATIVE
LEUKOCYTE ESTERASE: NEGATIVE
NITRITE: NEGATIVE
Ph: 6 (ref 4.5–8.0)
Protein: NEGATIVE
SQUAMOUS EPITHELIAL: NONE SEEN
Specific Gravity: 1.01 (ref 1.003–1.030)
WBC UR: NONE SEEN /HPF (ref 0–5)

## 2014-06-05 LAB — LIPASE, BLOOD: LIPASE: 1118 U/L — AB (ref 73–393)

## 2014-06-05 LAB — CLOSTRIDIUM DIFFICILE(ARMC)

## 2014-06-05 LAB — TROPONIN I: Troponin-I: <0.02 ng/mL

## 2014-06-06 LAB — COMPREHENSIVE METABOLIC PANEL
Albumin: 2.7 g/dL — ABNORMAL LOW (ref 3.4–5.0)
Alkaline Phosphatase: 131 U/L — ABNORMAL HIGH
Anion Gap: 8 (ref 7–16)
BUN: 5 mg/dL — ABNORMAL LOW (ref 7–18)
Bilirubin,Total: 0.2 mg/dL (ref 0.2–1.0)
CREATININE: 0.66 mg/dL (ref 0.60–1.30)
Calcium, Total: 8.1 mg/dL — ABNORMAL LOW (ref 8.5–10.1)
Chloride: 102 mmol/L (ref 98–107)
Co2: 28 mmol/L (ref 21–32)
EGFR (African American): >60
EGFR (Non-African Amer.): >60
Glucose: 65 mg/dL (ref 65–99)
OSMOLALITY: 271 (ref 275–301)
Potassium: 3.6 mmol/L (ref 3.5–5.1)
SGOT(AST): 19 U/L (ref 15–37)
SGPT (ALT): 55 U/L
Sodium: 138 mmol/L (ref 136–145)
TOTAL PROTEIN: 6.2 g/dL — AB (ref 6.4–8.2)

## 2014-06-06 LAB — CBC WITH DIFFERENTIAL/PLATELET
BASOS PCT: 0.4 %
Basophil #: 0 10*3/uL (ref 0.0–0.1)
EOS ABS: 0.1 10*3/uL (ref 0.0–0.7)
Eosinophil %: 0.8 %
HCT: 36.8 % — AB (ref 40.0–52.0)
HGB: 12.1 g/dL — ABNORMAL LOW (ref 13.0–18.0)
LYMPHS PCT: 16.7 %
Lymphocyte #: 1.8 10*3/uL (ref 1.0–3.6)
MCH: 32.2 pg (ref 26.0–34.0)
MCHC: 33 g/dL (ref 32.0–36.0)
MCV: 98 fL (ref 80–100)
MONO ABS: 0.7 x10 3/mm (ref 0.2–1.0)
MONOS PCT: 6.9 %
NEUTROS PCT: 75.2 %
Neutrophil #: 8.1 10*3/uL — ABNORMAL HIGH (ref 1.4–6.5)
Platelet: 338 10*3/uL (ref 150–440)
RBC: 3.78 10*6/uL — AB (ref 4.40–5.90)
RDW: 14.1 % (ref 11.5–14.5)
WBC: 10.8 10*3/uL — ABNORMAL HIGH (ref 3.8–10.6)

## 2014-06-06 LAB — LIPASE, BLOOD: Lipase: 1118 U/L — ABNORMAL HIGH (ref 73–393)

## 2014-06-07 LAB — CBC WITH DIFFERENTIAL/PLATELET
Basophil #: 0 10*3/uL (ref 0.0–0.1)
Basophil %: 0.6 %
EOS ABS: 0.1 10*3/uL (ref 0.0–0.7)
EOS PCT: 1.3 %
HCT: 36.8 % — AB (ref 40.0–52.0)
HGB: 11.8 g/dL — ABNORMAL LOW (ref 13.0–18.0)
LYMPHS ABS: 1.8 10*3/uL (ref 1.0–3.6)
Lymphocyte %: 22.3 %
MCH: 30.9 pg (ref 26.0–34.0)
MCHC: 32 g/dL (ref 32.0–36.0)
MCV: 97 fL (ref 80–100)
Monocyte #: 0.6 x10 3/mm (ref 0.2–1.0)
Monocyte %: 7.7 %
NEUTROS ABS: 5.4 10*3/uL (ref 1.4–6.5)
Neutrophil %: 68.1 %
Platelet: 330 10*3/uL (ref 150–440)
RBC: 3.81 10*6/uL — AB (ref 4.40–5.90)
RDW: 14.5 % (ref 11.5–14.5)
WBC: 7.9 10*3/uL (ref 3.8–10.6)

## 2014-06-07 LAB — STOOL CULTURE

## 2014-06-07 LAB — COMPREHENSIVE METABOLIC PANEL
ALBUMIN: 2.8 g/dL — AB (ref 3.4–5.0)
ALT: 48 U/L
Alkaline Phosphatase: 139 U/L — ABNORMAL HIGH
Anion Gap: 8 (ref 7–16)
BUN: 3 mg/dL — AB (ref 7–18)
Bilirubin,Total: 0.2 mg/dL (ref 0.2–1.0)
CHLORIDE: 103 mmol/L (ref 98–107)
Calcium, Total: 8.3 mg/dL — ABNORMAL LOW (ref 8.5–10.1)
Co2: 25 mmol/L (ref 21–32)
Creatinine: 0.62 mg/dL (ref 0.60–1.30)
EGFR (African American): >60
EGFR (Non-African Amer.): >60
Glucose: 89 mg/dL (ref 65–99)
Osmolality: 268 (ref 275–301)
POTASSIUM: 3.7 mmol/L (ref 3.5–5.1)
SGOT(AST): 30 U/L (ref 15–37)
SODIUM: 136 mmol/L (ref 136–145)
Total Protein: 6.7 g/dL (ref 6.4–8.2)

## 2014-06-07 LAB — TRIGLYCERIDES: TRIGLYCERIDES: 187 mg/dL (ref 0–200)

## 2014-06-07 LAB — LIPASE, BLOOD: LIPASE: 856 U/L — AB (ref 73–393)

## 2014-06-08 LAB — COMPREHENSIVE METABOLIC PANEL
ALBUMIN: 2.7 g/dL — AB (ref 3.4–5.0)
ALK PHOS: 138 U/L — AB
ALT: 47 U/L
Anion Gap: 9 (ref 7–16)
BILIRUBIN TOTAL: 0.3 mg/dL (ref 0.2–1.0)
BUN: 4 mg/dL — ABNORMAL LOW (ref 7–18)
CALCIUM: 8.6 mg/dL (ref 8.5–10.1)
CHLORIDE: 105 mmol/L (ref 98–107)
Co2: 26 mmol/L (ref 21–32)
Creatinine: 0.62 mg/dL (ref 0.60–1.30)
EGFR (African American): >60
EGFR (Non-African Amer.): >60
Glucose: 91 mg/dL (ref 65–99)
OSMOLALITY: 276 (ref 275–301)
Potassium: 3.4 mmol/L — ABNORMAL LOW (ref 3.5–5.1)
SGOT(AST): 31 U/L (ref 15–37)
Sodium: 140 mmol/L (ref 136–145)
Total Protein: 6.5 g/dL (ref 6.4–8.2)

## 2014-06-08 LAB — CBC WITH DIFFERENTIAL/PLATELET
BASOS PCT: 5.3 %
Basophil #: 0.3 10*3/uL — ABNORMAL HIGH (ref 0.0–0.1)
Eosinophil #: 0 10*3/uL (ref 0.0–0.7)
Eosinophil %: 0.7 %
HCT: 36.7 % — AB (ref 40.0–52.0)
HGB: 11.9 g/dL — AB (ref 13.0–18.0)
LYMPHS PCT: 12.1 %
Lymphocyte #: 0.8 10*3/uL — ABNORMAL LOW (ref 1.0–3.6)
MCH: 31.3 pg (ref 26.0–34.0)
MCHC: 32.4 g/dL (ref 32.0–36.0)
MCV: 97 fL (ref 80–100)
MONOS PCT: 7.7 %
Monocyte #: 0.5 x10 3/mm (ref 0.2–1.0)
Neutrophil #: 4.7 10*3/uL (ref 1.4–6.5)
Neutrophil %: 74.2 %
Platelet: 313 10*3/uL (ref 150–440)
RBC: 3.79 10*6/uL — ABNORMAL LOW (ref 4.40–5.90)
RDW: 14.3 % (ref 11.5–14.5)
WBC: 6.3 10*3/uL (ref 3.8–10.6)

## 2014-06-09 LAB — COMPREHENSIVE METABOLIC PANEL
ALBUMIN: 2.9 g/dL — AB (ref 3.4–5.0)
ALK PHOS: 135 U/L — AB
Anion Gap: 9 (ref 7–16)
BILIRUBIN TOTAL: 0.2 mg/dL (ref 0.2–1.0)
BUN: 4 mg/dL — ABNORMAL LOW (ref 7–18)
CHLORIDE: 104 mmol/L (ref 98–107)
Calcium, Total: 8.9 mg/dL (ref 8.5–10.1)
Co2: 25 mmol/L (ref 21–32)
Creatinine: 0.74 mg/dL (ref 0.60–1.30)
EGFR (African American): >60
Glucose: 134 mg/dL — ABNORMAL HIGH (ref 65–99)
OSMOLALITY: 275 (ref 275–301)
Potassium: 3.4 mmol/L — ABNORMAL LOW (ref 3.5–5.1)
SGOT(AST): 36 U/L (ref 15–37)
SGPT (ALT): 49 U/L
Sodium: 138 mmol/L (ref 136–145)
TOTAL PROTEIN: 7 g/dL (ref 6.4–8.2)

## 2014-06-09 LAB — CBC WITH DIFFERENTIAL/PLATELET
BASOS ABS: 0.1 10*3/uL (ref 0.0–0.1)
Basophil %: 0.9 %
EOS PCT: 1.1 %
Eosinophil #: 0.1 10*3/uL (ref 0.0–0.7)
HCT: 39.1 % — ABNORMAL LOW (ref 40.0–52.0)
HGB: 13.1 g/dL (ref 13.0–18.0)
Lymphocyte #: 2.1 10*3/uL (ref 1.0–3.6)
Lymphocyte %: 29 %
MCH: 32.1 pg (ref 26.0–34.0)
MCHC: 33.5 g/dL (ref 32.0–36.0)
MCV: 96 fL (ref 80–100)
MONO ABS: 0.6 x10 3/mm (ref 0.2–1.0)
Monocyte %: 7.7 %
Neutrophil #: 4.4 10*3/uL (ref 1.4–6.5)
Neutrophil %: 61.3 %
Platelet: 349 10*3/uL (ref 150–440)
RBC: 4.08 10*6/uL — ABNORMAL LOW (ref 4.40–5.90)
RDW: 14.4 % (ref 11.5–14.5)
WBC: 7.5 10*3/uL (ref 3.8–10.6)

## 2014-06-10 ENCOUNTER — Emergency Department: Payer: Self-pay | Admitting: Emergency Medicine

## 2014-06-10 LAB — COMPREHENSIVE METABOLIC PANEL
ALK PHOS: 146 U/L — AB
ALT: 57 U/L
AST: 35 U/L (ref 15–37)
Albumin: 3.6 g/dL (ref 3.4–5.0)
Anion Gap: 11 (ref 7–16)
BUN: 7 mg/dL (ref 7–18)
Bilirubin,Total: 0.2 mg/dL (ref 0.2–1.0)
CHLORIDE: 103 mmol/L (ref 98–107)
CO2: 25 mmol/L (ref 21–32)
CREATININE: 0.82 mg/dL (ref 0.60–1.30)
Calcium, Total: 9.4 mg/dL (ref 8.5–10.1)
EGFR (African American): >60
EGFR (Non-African Amer.): >60
Glucose: 103 mg/dL — ABNORMAL HIGH (ref 65–99)
OSMOLALITY: 276 (ref 275–301)
Potassium: 3.8 mmol/L (ref 3.5–5.1)
Sodium: 139 mmol/L (ref 136–145)
Total Protein: 7.8 g/dL (ref 6.4–8.2)

## 2014-06-10 LAB — CBC WITH DIFFERENTIAL/PLATELET
BASOS PCT: 0.5 %
Basophil #: 0.1 10*3/uL (ref 0.0–0.1)
Eosinophil #: 0.1 10*3/uL (ref 0.0–0.7)
Eosinophil %: 0.9 %
HCT: 44.6 % (ref 40.0–52.0)
HGB: 14.7 g/dL (ref 13.0–18.0)
LYMPHS ABS: 2.4 10*3/uL (ref 1.0–3.6)
LYMPHS PCT: 20.9 %
MCH: 31.6 pg (ref 26.0–34.0)
MCHC: 32.9 g/dL (ref 32.0–36.0)
MCV: 96 fL (ref 80–100)
MONOS PCT: 5.9 %
Monocyte #: 0.7 x10 3/mm (ref 0.2–1.0)
Neutrophil #: 8.3 10*3/uL — ABNORMAL HIGH (ref 1.4–6.5)
Neutrophil %: 71.8 %
PLATELETS: 420 10*3/uL (ref 150–440)
RBC: 4.64 10*6/uL (ref 4.40–5.90)
RDW: 14.6 % — AB (ref 11.5–14.5)
WBC: 11.5 10*3/uL — ABNORMAL HIGH (ref 3.8–10.6)

## 2014-06-10 LAB — LIPASE, BLOOD: LIPASE: 1019 U/L — AB (ref 73–393)

## 2014-06-12 ENCOUNTER — Inpatient Hospital Stay: Payer: Self-pay | Admitting: Internal Medicine

## 2014-06-12 LAB — URINALYSIS, COMPLETE
BILIRUBIN, UR: NEGATIVE
Bacteria: NONE SEEN
Blood: NEGATIVE
Glucose,UR: NEGATIVE mg/dL (ref 0–75)
KETONE: NEGATIVE
Leukocyte Esterase: NEGATIVE
Nitrite: NEGATIVE
Ph: 7 (ref 4.5–8.0)
Protein: NEGATIVE
Specific Gravity: 1.006 (ref 1.003–1.030)
Squamous Epithelial: NONE SEEN
WBC UR: NONE SEEN /HPF (ref 0–5)

## 2014-06-12 LAB — CBC
HCT: 43.1 % (ref 40.0–52.0)
HGB: 14.2 g/dL (ref 13.0–18.0)
MCH: 31.7 pg (ref 26.0–34.0)
MCHC: 33 g/dL (ref 32.0–36.0)
MCV: 96 fL (ref 80–100)
Platelet: 424 10*3/uL (ref 150–440)
RBC: 4.48 10*6/uL (ref 4.40–5.90)
RDW: 14.5 % (ref 11.5–14.5)
WBC: 16.2 10*3/uL — ABNORMAL HIGH (ref 3.8–10.6)

## 2014-06-12 LAB — COMPREHENSIVE METABOLIC PANEL
ALBUMIN: 3.7 g/dL (ref 3.4–5.0)
ALK PHOS: 134 U/L — AB
Anion Gap: 10 (ref 7–16)
BUN: 4 mg/dL — ABNORMAL LOW (ref 7–18)
Bilirubin,Total: 0.2 mg/dL (ref 0.2–1.0)
CHLORIDE: 102 mmol/L (ref 98–107)
CO2: 24 mmol/L (ref 21–32)
CREATININE: 0.85 mg/dL (ref 0.60–1.30)
Calcium, Total: 9.1 mg/dL (ref 8.5–10.1)
EGFR (African American): >60
EGFR (Non-African Amer.): >60
GLUCOSE: 128 mg/dL — AB (ref 65–99)
Osmolality: 270 (ref 275–301)
Potassium: 4.2 mmol/L (ref 3.5–5.1)
SGOT(AST): 24 U/L (ref 15–37)
SGPT (ALT): 46 U/L
Sodium: 136 mmol/L (ref 136–145)
Total Protein: 7.7 g/dL (ref 6.4–8.2)

## 2014-06-12 LAB — LIPASE, BLOOD: LIPASE: 694 U/L — AB (ref 73–393)

## 2014-06-14 ENCOUNTER — Emergency Department: Payer: Self-pay | Admitting: Emergency Medicine

## 2014-06-15 LAB — URINALYSIS, COMPLETE
BILIRUBIN, UR: NEGATIVE
Blood: NEGATIVE
Glucose,UR: NEGATIVE mg/dL (ref 0–75)
Ketone: NEGATIVE
Leukocyte Esterase: NEGATIVE
Nitrite: NEGATIVE
Ph: 6 (ref 4.5–8.0)
Protein: NEGATIVE
RBC,UR: NONE SEEN /HPF (ref 0–5)
Specific Gravity: 1.001 (ref 1.003–1.030)
Squamous Epithelial: NONE SEEN
WBC UR: NONE SEEN /HPF (ref 0–5)

## 2014-06-15 LAB — CBC WITH DIFFERENTIAL/PLATELET
BASOS ABS: 0.1 10*3/uL (ref 0.0–0.1)
BASOS PCT: 0.8 %
EOS ABS: 0.1 10*3/uL (ref 0.0–0.7)
Eosinophil %: 1.1 %
HCT: 45.3 % (ref 40.0–52.0)
HGB: 14.6 g/dL (ref 13.0–18.0)
Lymphocyte #: 2.5 10*3/uL (ref 1.0–3.6)
Lymphocyte %: 23.3 %
MCH: 31.1 pg (ref 26.0–34.0)
MCHC: 32.3 g/dL (ref 32.0–36.0)
MCV: 97 fL (ref 80–100)
MONO ABS: 0.6 x10 3/mm (ref 0.2–1.0)
Monocyte %: 5.1 %
NEUTROS ABS: 7.5 10*3/uL — AB (ref 1.4–6.5)
Neutrophil %: 69.7 %
Platelet: 383 10*3/uL (ref 150–440)
RBC: 4.69 10*6/uL (ref 4.40–5.90)
RDW: 14.4 % (ref 11.5–14.5)
WBC: 10.8 10*3/uL — AB (ref 3.8–10.6)

## 2014-06-15 LAB — ETHANOL
ETHANOL LVL: 161 mg/dL
Ethanol %: 0.161 % — ABNORMAL HIGH (ref 0.000–0.080)

## 2014-06-15 LAB — TROPONIN I: Troponin-I: <0.02 ng/mL

## 2014-06-15 LAB — COMPREHENSIVE METABOLIC PANEL
ALBUMIN: 3.9 g/dL (ref 3.4–5.0)
ALK PHOS: 140 U/L — AB
Anion Gap: 10 (ref 7–16)
BILIRUBIN TOTAL: 0.2 mg/dL (ref 0.2–1.0)
BUN: 3 mg/dL — ABNORMAL LOW (ref 7–18)
CALCIUM: 9 mg/dL (ref 8.5–10.1)
Chloride: 102 mmol/L (ref 98–107)
Co2: 25 mmol/L (ref 21–32)
Creatinine: 0.88 mg/dL (ref 0.60–1.30)
GLUCOSE: 130 mg/dL — AB (ref 65–99)
Osmolality: 272 (ref 275–301)
Potassium: 3.8 mmol/L (ref 3.5–5.1)
SGOT(AST): 22 U/L (ref 15–37)
SGPT (ALT): 46 U/L
Sodium: 137 mmol/L (ref 136–145)
Total Protein: 7.9 g/dL (ref 6.4–8.2)

## 2014-06-15 LAB — LIPASE, BLOOD: Lipase: 487 U/L — ABNORMAL HIGH (ref 73–393)

## 2014-06-17 DIAGNOSIS — K860 Alcohol-induced chronic pancreatitis: Secondary | ICD-10-CM | POA: Insufficient documentation

## 2014-06-17 DIAGNOSIS — E1159 Type 2 diabetes mellitus with other circulatory complications: Secondary | ICD-10-CM | POA: Insufficient documentation

## 2014-06-21 DIAGNOSIS — K700 Alcoholic fatty liver: Secondary | ICD-10-CM | POA: Insufficient documentation

## 2014-06-21 DIAGNOSIS — R197 Diarrhea, unspecified: Secondary | ICD-10-CM | POA: Insufficient documentation

## 2014-07-28 LAB — URINALYSIS, COMPLETE
BILIRUBIN, UR: NEGATIVE
Blood: NEGATIVE
GLUCOSE, UR: NEGATIVE mg/dL (ref 0–75)
Leukocyte Esterase: NEGATIVE
NITRITE: NEGATIVE
PH: 5 (ref 4.5–8.0)
Protein: NEGATIVE
RBC,UR: 5 /HPF (ref 0–5)
Specific Gravity: 1.012 (ref 1.003–1.030)
Squamous Epithelial: <1
WBC UR: 6 /HPF (ref 0–5)

## 2014-07-28 LAB — DRUG SCREEN, URINE

## 2014-07-28 LAB — CBC
HCT: 45.1 % (ref 40.0–52.0)
HGB: 15.2 g/dL (ref 13.0–18.0)
MCH: 31.5 pg (ref 26.0–34.0)
MCHC: 33.7 g/dL (ref 32.0–36.0)
MCV: 94 fL (ref 80–100)
PLATELETS: 167 10*3/uL (ref 150–440)
RBC: 4.83 10*6/uL (ref 4.40–5.90)
RDW: 13.8 % (ref 11.5–14.5)
WBC: 8.7 10*3/uL (ref 3.8–10.6)

## 2014-07-28 LAB — COMPREHENSIVE METABOLIC PANEL
ANION GAP: 3 — AB (ref 7–16)
AST: 31 U/L (ref 15–37)
Albumin: 4.2 g/dL (ref 3.4–5.0)
Alkaline Phosphatase: 114 U/L
BUN: 9 mg/dL (ref 7–18)
Bilirubin,Total: 0.2 mg/dL (ref 0.2–1.0)
CALCIUM: 9 mg/dL (ref 8.5–10.1)
Chloride: 104 mmol/L (ref 98–107)
Co2: 32 mmol/L (ref 21–32)
Creatinine: 0.87 mg/dL (ref 0.60–1.30)
EGFR (African American): >60
Glucose: 132 mg/dL — ABNORMAL HIGH (ref 65–99)
Osmolality: 278 (ref 275–301)
Potassium: 3.6 mmol/L (ref 3.5–5.1)
SGPT (ALT): 43 U/L
SODIUM: 139 mmol/L (ref 136–145)
TOTAL PROTEIN: 7.5 g/dL (ref 6.4–8.2)

## 2014-07-28 LAB — ETHANOL: Ethanol: <3 mg/dL

## 2014-07-30 ENCOUNTER — Inpatient Hospital Stay: Payer: Self-pay | Admitting: Psychiatry

## 2014-07-30 LAB — URINE CULTURE

## 2014-07-31 LAB — DIFFERENTIAL
BASOS PCT: 0.5 %
Basophil #: 0 10*3/uL (ref 0.0–0.1)
Eosinophil #: 0.1 10*3/uL (ref 0.0–0.7)
Eosinophil %: 2 %
Lymphocyte #: 1.6 10*3/uL (ref 1.0–3.6)
Lymphocyte %: 24.4 %
Monocyte #: 0.4 x10 3/mm (ref 0.2–1.0)
Monocyte %: 6.5 %
NEUTROS PCT: 66.6 %
Neutrophil #: 4.3 10*3/uL (ref 1.4–6.5)

## 2014-07-31 LAB — WBC: WBC: 6.4 10*3/uL (ref 3.8–10.6)

## 2014-08-03 LAB — LIPID PANEL
CHOLESTEROL: 140 mg/dL (ref 0–200)
HDL: 35 mg/dL — AB (ref 40–60)
LDL CHOLESTEROL, CALC: 57 mg/dL (ref 0–100)
Triglycerides: 241 mg/dL — ABNORMAL HIGH (ref 0–200)
VLDL CHOLESTEROL, CALC: 48 mg/dL — AB (ref 5–40)

## 2014-08-03 LAB — HEMOGLOBIN A1C: HEMOGLOBIN A1C: 5.7 % (ref 4.2–6.3)

## 2014-08-03 LAB — TSH: Thyroid Stimulating Horm: 0.776 u[IU]/mL

## 2014-08-04 LAB — CBC WITH DIFFERENTIAL/PLATELET
BASOS ABS: 0 10*3/uL (ref 0.0–0.1)
Basophil %: 0.5 %
Eosinophil #: 0.1 10*3/uL (ref 0.0–0.7)
Eosinophil %: 1.1 %
HCT: 45.1 % (ref 40.0–52.0)
HGB: 14.5 g/dL (ref 13.0–18.0)
LYMPHS PCT: 18 %
Lymphocyte #: 1.6 10*3/uL (ref 1.0–3.6)
MCH: 30.5 pg (ref 26.0–34.0)
MCHC: 32.2 g/dL (ref 32.0–36.0)
MCV: 95 fL (ref 80–100)
Monocyte #: 0.6 x10 3/mm (ref 0.2–1.0)
Monocyte %: 6.3 %
NEUTROS PCT: 74.1 %
Neutrophil #: 6.6 10*3/uL — ABNORMAL HIGH (ref 1.4–6.5)
PLATELETS: 142 10*3/uL — AB (ref 150–440)
RBC: 4.75 10*6/uL (ref 4.40–5.90)
RDW: 13.7 % (ref 11.5–14.5)
WBC: 8.9 10*3/uL (ref 3.8–10.6)

## 2014-08-14 DIAGNOSIS — F1110 Opioid abuse, uncomplicated: Secondary | ICD-10-CM | POA: Insufficient documentation

## 2014-08-23 ENCOUNTER — Emergency Department: Payer: Self-pay | Admitting: Emergency Medicine

## 2014-08-23 LAB — COMPREHENSIVE METABOLIC PANEL
ALBUMIN: 3.9 g/dL (ref 3.4–5.0)
ALK PHOS: 121 U/L — AB
ALT: 45 U/L
Anion Gap: 8 (ref 7–16)
BILIRUBIN TOTAL: 0.2 mg/dL (ref 0.2–1.0)
BUN: 13 mg/dL (ref 7–18)
CALCIUM: 8.7 mg/dL (ref 8.5–10.1)
CHLORIDE: 103 mmol/L (ref 98–107)
CO2: 28 mmol/L (ref 21–32)
CREATININE: 0.85 mg/dL (ref 0.60–1.30)
EGFR (African American): >60
GLUCOSE: 106 mg/dL — AB (ref 65–99)
OSMOLALITY: 278 (ref 275–301)
Potassium: 3.9 mmol/L (ref 3.5–5.1)
SGOT(AST): 27 U/L (ref 15–37)
Sodium: 139 mmol/L (ref 136–145)
Total Protein: 7.5 g/dL (ref 6.4–8.2)

## 2014-08-23 LAB — DRUG SCREEN, URINE
AMPHETAMINES, UR SCREEN: NEGATIVE (ref ?–1000)
BARBITURATES, UR SCREEN: NEGATIVE (ref ?–200)
Benzodiazepine, Ur Scrn: POSITIVE (ref ?–200)
COCAINE METABOLITE, UR ~~LOC~~: NEGATIVE (ref ?–300)
Cannabinoid 50 Ng, Ur ~~LOC~~: NEGATIVE (ref ?–50)
MDMA (Ecstasy)Ur Screen: NEGATIVE (ref ?–500)
Methadone, Ur Screen: NEGATIVE (ref ?–300)
Opiate, Ur Screen: NEGATIVE (ref ?–300)
PHENCYCLIDINE (PCP) UR S: NEGATIVE (ref ?–25)
Tricyclic, Ur Screen: NEGATIVE (ref ?–1000)

## 2014-08-23 LAB — ACETAMINOPHEN LEVEL: Acetaminophen: <2 ug/mL

## 2014-08-23 LAB — URINALYSIS, COMPLETE
BACTERIA: NONE SEEN
Bilirubin,UR: NEGATIVE
Blood: NEGATIVE
Glucose,UR: NEGATIVE mg/dL (ref 0–75)
Ketone: NEGATIVE
Leukocyte Esterase: NEGATIVE
NITRITE: NEGATIVE
PH: 5 (ref 4.5–8.0)
PROTEIN: NEGATIVE
RBC,UR: NONE SEEN /HPF (ref 0–5)
SQUAMOUS EPITHELIAL: NONE SEEN
Specific Gravity: 1.023 (ref 1.003–1.030)

## 2014-08-23 LAB — ETHANOL

## 2014-08-23 LAB — CBC
HCT: 46.1 % (ref 40.0–52.0)
HGB: 15.3 g/dL (ref 13.0–18.0)
MCH: 31 pg (ref 26.0–34.0)
MCHC: 33.1 g/dL (ref 32.0–36.0)
MCV: 94 fL (ref 80–100)
PLATELETS: 171 10*3/uL (ref 150–440)
RBC: 4.93 10*6/uL (ref 4.40–5.90)
RDW: 13.6 % (ref 11.5–14.5)
WBC: 10.7 10*3/uL — ABNORMAL HIGH (ref 3.8–10.6)

## 2014-08-23 LAB — SALICYLATE LEVEL: Salicylates, Serum: 2.3 mg/dL

## 2014-08-24 ENCOUNTER — Emergency Department: Payer: Self-pay | Admitting: Emergency Medicine

## 2014-08-25 LAB — URINALYSIS, COMPLETE
BILIRUBIN, UR: NEGATIVE
Bacteria: NONE SEEN
Blood: NEGATIVE
Glucose,UR: NEGATIVE mg/dL (ref 0–75)
Ketone: NEGATIVE
Leukocyte Esterase: NEGATIVE
Nitrite: NEGATIVE
PROTEIN: NEGATIVE
Ph: 6 (ref 4.5–8.0)
RBC,UR: NONE SEEN /HPF (ref 0–5)
Specific Gravity: 1.01 (ref 1.003–1.030)
Squamous Epithelial: NONE SEEN

## 2014-08-25 LAB — CBC WITH DIFFERENTIAL/PLATELET
BASOS ABS: 0.1 10*3/uL (ref 0.0–0.1)
Basophil %: 1.6 %
Eosinophil #: 0.2 10*3/uL (ref 0.0–0.7)
Eosinophil %: 2.1 %
HCT: 43.7 % (ref 40.0–52.0)
HGB: 14.4 g/dL (ref 13.0–18.0)
LYMPHS PCT: 25.9 %
Lymphocyte #: 2.1 10*3/uL (ref 1.0–3.6)
MCH: 31.1 pg (ref 26.0–34.0)
MCHC: 33 g/dL (ref 32.0–36.0)
MCV: 94 fL (ref 80–100)
MONO ABS: 0.4 x10 3/mm (ref 0.2–1.0)
Monocyte %: 4.7 %
NEUTROS PCT: 65.7 %
Neutrophil #: 5.3 10*3/uL (ref 1.4–6.5)
Platelet: 139 10*3/uL — ABNORMAL LOW (ref 150–440)
RBC: 4.63 10*6/uL (ref 4.40–5.90)
RDW: 14 % (ref 11.5–14.5)
WBC: 8 10*3/uL (ref 3.8–10.6)

## 2014-08-25 LAB — COMPREHENSIVE METABOLIC PANEL
ALT: 46 U/L
AST: 22 U/L (ref 15–37)
Albumin: 3.7 g/dL (ref 3.4–5.0)
Alkaline Phosphatase: 115 U/L
Anion Gap: 12 (ref 7–16)
BILIRUBIN TOTAL: 0.2 mg/dL (ref 0.2–1.0)
BUN: 12 mg/dL (ref 7–18)
CHLORIDE: 105 mmol/L (ref 98–107)
CREATININE: 0.9 mg/dL (ref 0.60–1.30)
Calcium, Total: 8.5 mg/dL (ref 8.5–10.1)
Co2: 25 mmol/L (ref 21–32)
EGFR (African American): >60
EGFR (Non-African Amer.): >60
GLUCOSE: 189 mg/dL — AB (ref 65–99)
Osmolality: 288 (ref 275–301)
Potassium: 3.9 mmol/L (ref 3.5–5.1)
Sodium: 142 mmol/L (ref 136–145)
TOTAL PROTEIN: 7 g/dL (ref 6.4–8.2)

## 2014-08-25 LAB — TROPONIN I: Troponin-I: <0.02 ng/mL

## 2014-08-25 LAB — LIPASE, BLOOD: Lipase: 191 U/L (ref 73–393)

## 2014-08-30 ENCOUNTER — Inpatient Hospital Stay: Payer: Self-pay | Admitting: Psychiatry

## 2014-08-30 LAB — URINALYSIS, COMPLETE
Bacteria: NONE SEEN
Bilirubin,UR: NEGATIVE
Blood: NEGATIVE
GLUCOSE, UR: NEGATIVE mg/dL (ref 0–75)
KETONE: NEGATIVE
LEUKOCYTE ESTERASE: NEGATIVE
Nitrite: NEGATIVE
PH: 6 (ref 4.5–8.0)
Protein: NEGATIVE
RBC,UR: <1 /HPF (ref 0–5)
Specific Gravity: 1.015 (ref 1.003–1.030)
WBC UR: 1 /HPF (ref 0–5)

## 2014-08-30 LAB — DRUG SCREEN, URINE
Amphetamines, Ur Screen: NEGATIVE (ref ?–1000)
BENZODIAZEPINE, UR SCRN: POSITIVE (ref ?–200)
Barbiturates, Ur Screen: NEGATIVE (ref ?–200)
Cannabinoid 50 Ng, Ur ~~LOC~~: NEGATIVE (ref ?–50)
Cocaine Metabolite,Ur ~~LOC~~: NEGATIVE (ref ?–300)
MDMA (Ecstasy)Ur Screen: NEGATIVE (ref ?–500)
METHADONE, UR SCREEN: NEGATIVE (ref ?–300)
OPIATE, UR SCREEN: NEGATIVE (ref ?–300)
PHENCYCLIDINE (PCP) UR S: NEGATIVE (ref ?–25)
Tricyclic, Ur Screen: NEGATIVE (ref ?–1000)

## 2014-08-30 LAB — ACETAMINOPHEN LEVEL

## 2014-08-30 LAB — SALICYLATE LEVEL: SALICYLATES, SERUM: 2.6 mg/dL

## 2014-08-30 LAB — TROPONIN I: Troponin-I: <0.02 ng/mL

## 2014-08-30 LAB — D-DIMER(ARMC): D-Dimer: 360 ng/ml

## 2014-08-30 LAB — LIPASE, BLOOD: Lipase: 137 U/L (ref 73–393)

## 2014-08-30 LAB — ETHANOL

## 2014-09-01 LAB — DIFFERENTIAL
Basophil #: 0 10*3/uL (ref 0.0–0.1)
Basophil %: 0.1 %
Eosinophil #: 0.1 10*3/uL (ref 0.0–0.7)
Eosinophil %: 1 %
Lymphocyte #: 1.2 10*3/uL (ref 1.0–3.6)
Lymphocyte %: 11.7 %
Monocyte #: 0.5 x10 3/mm (ref 0.2–1.0)
Monocyte %: 4.6 %
Neutrophil #: 8.8 10*3/uL — ABNORMAL HIGH (ref 1.4–6.5)
Neutrophil %: 82.6 %

## 2014-09-01 LAB — CBC
HCT: 44.9 % (ref 40.0–52.0)
HGB: 14.7 g/dL (ref 13.0–18.0)
MCH: 31 pg (ref 26.0–34.0)
MCHC: 32.7 g/dL (ref 32.0–36.0)
MCV: 95 fL (ref 80–100)
Platelet: 141 10*3/uL — ABNORMAL LOW (ref 150–440)
RBC: 4.73 10*6/uL (ref 4.40–5.90)
RDW: 14.1 % (ref 11.5–14.5)
WBC: 10.7 10*3/uL — ABNORMAL HIGH (ref 3.8–10.6)

## 2014-09-01 LAB — COMPREHENSIVE METABOLIC PANEL
ALK PHOS: 124 U/L — AB
ANION GAP: 10 (ref 7–16)
AST: 32 U/L (ref 15–37)
Albumin: 4.1 g/dL (ref 3.4–5.0)
BUN: 11 mg/dL (ref 7–18)
Bilirubin,Total: 0.2 mg/dL (ref 0.2–1.0)
CALCIUM: 9.2 mg/dL (ref 8.5–10.1)
Chloride: 104 mmol/L (ref 98–107)
Co2: 27 mmol/L (ref 21–32)
Creatinine: 0.87 mg/dL (ref 0.60–1.30)
EGFR (African American): >60
EGFR (Non-African Amer.): >60
Glucose: 125 mg/dL — ABNORMAL HIGH (ref 65–99)
OSMOLALITY: 282 (ref 275–301)
Potassium: 3.7 mmol/L (ref 3.5–5.1)
SGPT (ALT): 47 U/L
Sodium: 141 mmol/L (ref 136–145)
Total Protein: 7.6 g/dL (ref 6.4–8.2)

## 2014-10-10 ENCOUNTER — Emergency Department: Payer: Self-pay | Admitting: Emergency Medicine

## 2014-10-10 LAB — COMPREHENSIVE METABOLIC PANEL
ALK PHOS: 151 U/L — AB
ALT: 68 U/L — AB
AST: 45 U/L — AB (ref 15–37)
Albumin: 3.8 g/dL (ref 3.4–5.0)
Anion Gap: 11 (ref 7–16)
BILIRUBIN TOTAL: 0.2 mg/dL (ref 0.2–1.0)
BUN: 9 mg/dL (ref 7–18)
CALCIUM: 8.4 mg/dL — AB (ref 8.5–10.1)
Chloride: 104 mmol/L (ref 98–107)
Co2: 26 mmol/L (ref 21–32)
Creatinine: 0.84 mg/dL (ref 0.60–1.30)
EGFR (African American): >60
Glucose: 137 mg/dL — ABNORMAL HIGH (ref 65–99)
Osmolality: 282 (ref 275–301)
POTASSIUM: 3.7 mmol/L (ref 3.5–5.1)
Sodium: 141 mmol/L (ref 136–145)
Total Protein: 7.7 g/dL (ref 6.4–8.2)

## 2014-10-10 LAB — CBC WITH DIFFERENTIAL/PLATELET
BASOS ABS: 0 10*3/uL (ref 0.0–0.1)
Basophil %: 0.2 %
Eosinophil #: 0.1 10*3/uL (ref 0.0–0.7)
Eosinophil %: 1.5 %
HCT: 46.4 % (ref 40.0–52.0)
HGB: 15.5 g/dL (ref 13.0–18.0)
Lymphocyte #: 2.4 10*3/uL (ref 1.0–3.6)
Lymphocyte %: 25.8 %
MCH: 31.7 pg (ref 26.0–34.0)
MCHC: 33.4 g/dL (ref 32.0–36.0)
MCV: 95 fL (ref 80–100)
Monocyte #: 0.4 x10 3/mm (ref 0.2–1.0)
Monocyte %: 4.3 %
Neutrophil #: 6.3 10*3/uL (ref 1.4–6.5)
Neutrophil %: 68.2 %
Platelet: 176 10*3/uL (ref 150–440)
RBC: 4.89 10*6/uL (ref 4.40–5.90)
RDW: 14.6 % — ABNORMAL HIGH (ref 11.5–14.5)
WBC: 9.2 10*3/uL (ref 3.8–10.6)

## 2014-10-10 LAB — URINALYSIS, COMPLETE
BACTERIA: NONE SEEN
BLOOD: NEGATIVE
Bilirubin,UR: NEGATIVE
Glucose,UR: NEGATIVE mg/dL (ref 0–75)
Ketone: NEGATIVE
LEUKOCYTE ESTERASE: NEGATIVE
NITRITE: NEGATIVE
PH: 6 (ref 4.5–8.0)
Protein: NEGATIVE
RBC,UR: <1 /HPF (ref 0–5)
SQUAMOUS EPITHELIAL: NONE SEEN
Specific Gravity: 1.001 (ref 1.003–1.030)
WBC UR: <1 /HPF (ref 0–5)

## 2014-10-10 LAB — TROPONIN I

## 2014-10-10 LAB — LIPASE, BLOOD: LIPASE: 222 U/L (ref 73–393)

## 2014-10-11 ENCOUNTER — Emergency Department: Payer: Self-pay | Admitting: Emergency Medicine

## 2014-10-11 LAB — CBC WITH DIFFERENTIAL/PLATELET
Basophil #: 0.1 10*3/uL (ref 0.0–0.1)
Basophil %: 1.5 %
Eosinophil #: 0.2 10*3/uL (ref 0.0–0.7)
Eosinophil %: 2.4 %
HCT: 45.5 % (ref 40.0–52.0)
HGB: 15 g/dL (ref 13.0–18.0)
Lymphocyte #: 2.2 10*3/uL (ref 1.0–3.6)
Lymphocyte %: 27.5 %
MCH: 31.4 pg (ref 26.0–34.0)
MCHC: 32.9 g/dL (ref 32.0–36.0)
MCV: 95 fL (ref 80–100)
MONO ABS: 0.4 x10 3/mm (ref 0.2–1.0)
MONOS PCT: 4.9 %
NEUTROS ABS: 5.1 10*3/uL (ref 1.4–6.5)
Neutrophil %: 63.7 %
Platelet: 165 10*3/uL (ref 150–440)
RBC: 4.77 10*6/uL (ref 4.40–5.90)
RDW: 14.2 % (ref 11.5–14.5)
WBC: 8.1 10*3/uL (ref 3.8–10.6)

## 2014-10-11 LAB — URINALYSIS, COMPLETE
BILIRUBIN, UR: NEGATIVE
BLOOD: NEGATIVE
Bacteria: NONE SEEN
GLUCOSE, UR: NEGATIVE mg/dL (ref 0–75)
Ketone: NEGATIVE
Leukocyte Esterase: NEGATIVE
Nitrite: NEGATIVE
PROTEIN: NEGATIVE
Ph: 6 (ref 4.5–8.0)
RBC,UR: NONE SEEN /HPF (ref 0–5)
Specific Gravity: 1.002 (ref 1.003–1.030)
Squamous Epithelial: NONE SEEN
WBC UR: <1 /HPF (ref 0–5)

## 2014-10-11 LAB — COMPREHENSIVE METABOLIC PANEL
ANION GAP: 10 (ref 7–16)
AST: 45 U/L — AB (ref 15–37)
Albumin: 3.8 g/dL (ref 3.4–5.0)
Alkaline Phosphatase: 139 U/L — ABNORMAL HIGH
BILIRUBIN TOTAL: 0.2 mg/dL (ref 0.2–1.0)
BUN: 10 mg/dL (ref 7–18)
Calcium, Total: 9 mg/dL (ref 8.5–10.1)
Chloride: 104 mmol/L (ref 98–107)
Co2: 24 mmol/L (ref 21–32)
Creatinine: 0.72 mg/dL (ref 0.60–1.30)
EGFR (African American): >60
EGFR (Non-African Amer.): >60
Glucose: 108 mg/dL — ABNORMAL HIGH (ref 65–99)
Osmolality: 275 (ref 275–301)
POTASSIUM: 3.8 mmol/L (ref 3.5–5.1)
SGPT (ALT): 70 U/L — ABNORMAL HIGH
SODIUM: 138 mmol/L (ref 136–145)
TOTAL PROTEIN: 7.1 g/dL (ref 6.4–8.2)

## 2014-10-11 LAB — LIPASE, BLOOD: Lipase: 193 U/L (ref 73–393)

## 2014-10-14 ENCOUNTER — Emergency Department: Payer: Self-pay | Admitting: Emergency Medicine

## 2014-10-14 LAB — CBC
HCT: 44.4 % (ref 40.0–52.0)
HGB: 14.7 g/dL (ref 13.0–18.0)
MCH: 31.5 pg (ref 26.0–34.0)
MCHC: 33.2 g/dL (ref 32.0–36.0)
MCV: 95 fL (ref 80–100)
Platelet: 149 10*3/uL — ABNORMAL LOW (ref 150–440)
RBC: 4.68 10*6/uL (ref 4.40–5.90)
RDW: 14.7 % — ABNORMAL HIGH (ref 11.5–14.5)
WBC: 10.6 10*3/uL (ref 3.8–10.6)

## 2014-10-14 LAB — COMPREHENSIVE METABOLIC PANEL
ALBUMIN: 3.8 g/dL (ref 3.4–5.0)
ALK PHOS: 143 U/L — AB
ALT: 67 U/L — AB
Anion Gap: 12 (ref 7–16)
BUN: 8 mg/dL (ref 7–18)
Bilirubin,Total: 0.3 mg/dL (ref 0.2–1.0)
CHLORIDE: 103 mmol/L (ref 98–107)
Calcium, Total: 8.8 mg/dL (ref 8.5–10.1)
Co2: 23 mmol/L (ref 21–32)
Creatinine: 1.03 mg/dL (ref 0.60–1.30)
EGFR (Non-African Amer.): >60
Glucose: 147 mg/dL — ABNORMAL HIGH (ref 65–99)
Osmolality: 277 (ref 275–301)
Potassium: 3.6 mmol/L (ref 3.5–5.1)
SGOT(AST): 39 U/L — ABNORMAL HIGH (ref 15–37)
Sodium: 138 mmol/L (ref 136–145)
TOTAL PROTEIN: 7.4 g/dL (ref 6.4–8.2)

## 2014-10-14 LAB — LIPASE, BLOOD: Lipase: 262 U/L (ref 73–393)

## 2014-10-20 ENCOUNTER — Emergency Department: Payer: Self-pay | Admitting: Emergency Medicine

## 2014-10-20 LAB — URINALYSIS, COMPLETE
Bilirubin,UR: NEGATIVE
Blood: NEGATIVE
Glucose,UR: NEGATIVE mg/dL (ref 0–75)
Ketone: NEGATIVE
LEUKOCYTE ESTERASE: NEGATIVE
Nitrite: NEGATIVE
Ph: 6 (ref 4.5–8.0)
Protein: NEGATIVE
RBC,UR: <1 /HPF (ref 0–5)
SPECIFIC GRAVITY: 1.001 (ref 1.003–1.030)
SQUAMOUS EPITHELIAL: NONE SEEN
WBC UR: NONE SEEN /HPF (ref 0–5)

## 2014-10-20 LAB — COMPREHENSIVE METABOLIC PANEL
ALT: 72 U/L — AB
ANION GAP: 9 (ref 7–16)
Albumin: 3.6 g/dL (ref 3.4–5.0)
Alkaline Phosphatase: 133 U/L — ABNORMAL HIGH
BUN: 6 mg/dL — ABNORMAL LOW (ref 7–18)
Bilirubin,Total: 0.3 mg/dL (ref 0.2–1.0)
CHLORIDE: 104 mmol/L (ref 98–107)
CO2: 24 mmol/L (ref 21–32)
Calcium, Total: 8.7 mg/dL (ref 8.5–10.1)
Creatinine: 0.71 mg/dL (ref 0.60–1.30)
EGFR (Non-African Amer.): >60
Glucose: 103 mg/dL — ABNORMAL HIGH (ref 65–99)
OSMOLALITY: 272 (ref 275–301)
POTASSIUM: 3.5 mmol/L (ref 3.5–5.1)
SGOT(AST): 46 U/L — ABNORMAL HIGH (ref 15–37)
Sodium: 137 mmol/L (ref 136–145)
TOTAL PROTEIN: 7.4 g/dL (ref 6.4–8.2)

## 2014-10-20 LAB — CBC WITH DIFFERENTIAL/PLATELET
Basophil #: 0 10*3/uL (ref 0.0–0.1)
Basophil %: 0.4 %
EOS PCT: 0.6 %
Eosinophil #: 0 10*3/uL (ref 0.0–0.7)
HCT: 44.5 % (ref 40.0–52.0)
HGB: 14.6 g/dL (ref 13.0–18.0)
LYMPHS ABS: 1.9 10*3/uL (ref 1.0–3.6)
LYMPHS PCT: 23.9 %
MCH: 31.2 pg (ref 26.0–34.0)
MCHC: 32.7 g/dL (ref 32.0–36.0)
MCV: 95 fL (ref 80–100)
Monocyte #: 0.7 x10 3/mm (ref 0.2–1.0)
Monocyte %: 8.2 %
NEUTROS PCT: 66.9 %
Neutrophil #: 5.3 10*3/uL (ref 1.4–6.5)
Platelet: 153 10*3/uL (ref 150–440)
RBC: 4.67 10*6/uL (ref 4.40–5.90)
RDW: 14.4 % (ref 11.5–14.5)
WBC: 8 10*3/uL (ref 3.8–10.6)

## 2014-10-20 LAB — DRUG SCREEN, URINE
Amphetamines, Ur Screen: NEGATIVE (ref ?–1000)
BARBITURATES, UR SCREEN: NEGATIVE (ref ?–200)
Benzodiazepine, Ur Scrn: NEGATIVE (ref ?–200)
CANNABINOID 50 NG, UR ~~LOC~~: NEGATIVE (ref ?–50)
COCAINE METABOLITE, UR ~~LOC~~: NEGATIVE (ref ?–300)
MDMA (Ecstasy)Ur Screen: NEGATIVE (ref ?–500)
METHADONE, UR SCREEN: NEGATIVE (ref ?–300)
OPIATE, UR SCREEN: NEGATIVE (ref ?–300)
Phencyclidine (PCP) Ur S: NEGATIVE (ref ?–25)
Tricyclic, Ur Screen: NEGATIVE (ref ?–1000)

## 2014-10-20 LAB — TROPONIN I: Troponin-I: <0.02 ng/mL

## 2014-10-20 LAB — LIPASE, BLOOD: Lipase: 282 U/L (ref 73–393)

## 2014-10-20 LAB — ETHANOL: ETHANOL LVL: 187 mg/dL

## 2014-10-25 ENCOUNTER — Emergency Department: Payer: Self-pay | Admitting: Emergency Medicine

## 2014-10-30 ENCOUNTER — Emergency Department: Payer: Self-pay | Admitting: Emergency Medicine

## 2014-10-30 LAB — COMPREHENSIVE METABOLIC PANEL
AST: 42 U/L — AB (ref 15–37)
Albumin: 3.6 g/dL (ref 3.4–5.0)
Alkaline Phosphatase: 130 U/L — ABNORMAL HIGH
Anion Gap: 11 (ref 7–16)
BILIRUBIN TOTAL: 0.2 mg/dL (ref 0.2–1.0)
BUN: 7 mg/dL (ref 7–18)
CALCIUM: 8.6 mg/dL (ref 8.5–10.1)
CO2: 24 mmol/L (ref 21–32)
Chloride: 109 mmol/L — ABNORMAL HIGH (ref 98–107)
Creatinine: 0.89 mg/dL (ref 0.60–1.30)
EGFR (African American): >60
GLUCOSE: 145 mg/dL — AB (ref 65–99)
Osmolality: 287 (ref 275–301)
POTASSIUM: 3.7 mmol/L (ref 3.5–5.1)
SGPT (ALT): 60 U/L
Sodium: 144 mmol/L (ref 136–145)
Total Protein: 7.1 g/dL (ref 6.4–8.2)

## 2014-10-30 LAB — DIFFERENTIAL
Basophil #: 0 10*3/uL (ref 0.0–0.1)
Basophil %: 0.4 %
Eosinophil #: 0 10*3/uL (ref 0.0–0.7)
Eosinophil %: 0.3 %
Lymphocyte #: 2.3 10*3/uL (ref 1.0–3.6)
Lymphocyte %: 30.4 %
Monocyte #: 0.5 x10 3/mm (ref 0.2–1.0)
Monocyte %: 7.2 %
Neutrophil #: 4.6 10*3/uL (ref 1.4–6.5)
Neutrophil %: 61.7 %

## 2014-10-30 LAB — TROPONIN I: Troponin-I: <0.02 ng/mL

## 2014-10-30 LAB — CBC
HCT: 43.1 % (ref 40.0–52.0)
HGB: 14.5 g/dL (ref 13.0–18.0)
MCH: 31.8 pg (ref 26.0–34.0)
MCHC: 33.5 g/dL (ref 32.0–36.0)
MCV: 95 fL (ref 80–100)
Platelet: 151 10*3/uL (ref 150–440)
RBC: 4.54 10*6/uL (ref 4.40–5.90)
RDW: 14.5 % (ref 11.5–14.5)
WBC: 8 10*3/uL (ref 3.8–10.6)

## 2014-10-30 LAB — DRUG SCREEN, URINE
Amphetamines, Ur Screen: NEGATIVE (ref ?–1000)
BENZODIAZEPINE, UR SCRN: NEGATIVE (ref ?–200)
Barbiturates, Ur Screen: NEGATIVE (ref ?–200)
COCAINE METABOLITE, UR ~~LOC~~: NEGATIVE (ref ?–300)
Cannabinoid 50 Ng, Ur ~~LOC~~: NEGATIVE (ref ?–50)
MDMA (ECSTASY) UR SCREEN: NEGATIVE (ref ?–500)
Methadone, Ur Screen: NEGATIVE (ref ?–300)
OPIATE, UR SCREEN: NEGATIVE (ref ?–300)
Phencyclidine (PCP) Ur S: NEGATIVE (ref ?–25)
Tricyclic, Ur Screen: NEGATIVE (ref ?–1000)

## 2014-10-30 LAB — LIPASE, BLOOD: Lipase: 204 U/L (ref 73–393)

## 2014-10-30 LAB — ETHANOL: Ethanol: 230 mg/dL

## 2014-11-11 ENCOUNTER — Emergency Department: Payer: Self-pay | Admitting: Emergency Medicine

## 2014-11-11 LAB — TROPONIN I: Troponin-I: <0.02 ng/mL

## 2014-11-11 LAB — BASIC METABOLIC PANEL
Anion Gap: 11 (ref 7–16)
BUN: 8 mg/dL (ref 7–18)
CHLORIDE: 106 mmol/L (ref 98–107)
Calcium, Total: 9 mg/dL (ref 8.5–10.1)
Co2: 26 mmol/L (ref 21–32)
Creatinine: 0.97 mg/dL (ref 0.60–1.30)
EGFR (African American): >60
EGFR (Non-African Amer.): >60
GLUCOSE: 145 mg/dL — AB (ref 65–99)
Osmolality: 286 (ref 275–301)
POTASSIUM: 3.7 mmol/L (ref 3.5–5.1)
SODIUM: 143 mmol/L (ref 136–145)

## 2014-11-11 LAB — CBC
HCT: 46.1 % (ref 40.0–52.0)
HGB: 15.1 g/dL (ref 13.0–18.0)
MCH: 31.8 pg (ref 26.0–34.0)
MCHC: 32.8 g/dL (ref 32.0–36.0)
MCV: 97 fL (ref 80–100)
Platelet: 146 10*3/uL — ABNORMAL LOW (ref 150–440)
RBC: 4.76 10*6/uL (ref 4.40–5.90)
RDW: 14.4 % (ref 11.5–14.5)
WBC: 5.8 10*3/uL (ref 3.8–10.6)

## 2014-11-11 LAB — ETHANOL: ETHANOL LVL: 213 mg/dL

## 2014-11-12 LAB — LIPASE, BLOOD: Lipase: 215 U/L (ref 73–393)

## 2014-11-14 ENCOUNTER — Emergency Department: Payer: Self-pay | Admitting: Emergency Medicine

## 2014-11-14 LAB — BASIC METABOLIC PANEL
ANION GAP: 10 (ref 7–16)
BUN: 10 mg/dL (ref 7–18)
CREATININE: 0.79 mg/dL (ref 0.60–1.30)
Calcium, Total: 9 mg/dL (ref 8.5–10.1)
Chloride: 97 mmol/L — ABNORMAL LOW (ref 98–107)
Co2: 30 mmol/L (ref 21–32)
EGFR (Non-African Amer.): >60
Glucose: 142 mg/dL — ABNORMAL HIGH (ref 65–99)
OSMOLALITY: 275 (ref 275–301)
Potassium: 3.3 mmol/L — ABNORMAL LOW (ref 3.5–5.1)
Sodium: 137 mmol/L (ref 136–145)

## 2014-11-14 LAB — CBC
HCT: 46.5 % (ref 40.0–52.0)
HGB: 15.9 g/dL (ref 13.0–18.0)
MCH: 31.9 pg (ref 26.0–34.0)
MCHC: 34.1 g/dL (ref 32.0–36.0)
MCV: 94 fL (ref 80–100)
PLATELETS: 151 10*3/uL (ref 150–440)
RBC: 4.97 10*6/uL (ref 4.40–5.90)
RDW: 14.4 % (ref 11.5–14.5)
WBC: 7.3 10*3/uL (ref 3.8–10.6)

## 2014-11-14 LAB — ETHANOL: Ethanol: 158 mg/dL

## 2014-11-14 LAB — TROPONIN I: Troponin-I: <0.02 ng/mL

## 2014-11-14 LAB — D-DIMER(ARMC): D-Dimer: 489 ng/ml

## 2014-11-14 LAB — LIPASE, BLOOD: Lipase: 194 U/L (ref 73–393)

## 2014-11-26 ENCOUNTER — Emergency Department: Payer: Self-pay | Admitting: Emergency Medicine

## 2014-11-26 LAB — URINALYSIS, COMPLETE
BILIRUBIN, UR: NEGATIVE
Bacteria: NONE SEEN
Blood: NEGATIVE
Glucose,UR: NEGATIVE mg/dL (ref 0–75)
Ketone: NEGATIVE
Leukocyte Esterase: NEGATIVE
Nitrite: NEGATIVE
Ph: 6 (ref 4.5–8.0)
Protein: NEGATIVE
RBC,UR: <1 /HPF (ref 0–5)
SQUAMOUS EPITHELIAL: NONE SEEN
Specific Gravity: 1.002 (ref 1.003–1.030)
WBC UR: <1 /HPF (ref 0–5)

## 2014-11-26 LAB — COMPREHENSIVE METABOLIC PANEL
ALBUMIN: 3.8 g/dL (ref 3.4–5.0)
ANION GAP: 11 (ref 7–16)
Alkaline Phosphatase: 136 U/L — ABNORMAL HIGH
BUN: 9 mg/dL (ref 7–18)
Bilirubin,Total: 0.3 mg/dL (ref 0.2–1.0)
Calcium, Total: 8.5 mg/dL (ref 8.5–10.1)
Chloride: 105 mmol/L (ref 98–107)
Co2: 24 mmol/L (ref 21–32)
Creatinine: 0.79 mg/dL (ref 0.60–1.30)
EGFR (Non-African Amer.): >60
Glucose: 133 mg/dL — ABNORMAL HIGH (ref 65–99)
OSMOLALITY: 280 (ref 275–301)
POTASSIUM: 3.3 mmol/L — AB (ref 3.5–5.1)
SGOT(AST): 50 U/L — ABNORMAL HIGH (ref 15–37)
SGPT (ALT): 75 U/L — ABNORMAL HIGH
SODIUM: 140 mmol/L (ref 136–145)
TOTAL PROTEIN: 7.5 g/dL (ref 6.4–8.2)

## 2014-11-26 LAB — ACETAMINOPHEN LEVEL

## 2014-11-26 LAB — DRUG SCREEN, URINE

## 2014-11-26 LAB — CBC
HCT: 44.5 % (ref 40.0–52.0)
HGB: 15.1 g/dL (ref 13.0–18.0)
MCH: 32.2 pg (ref 26.0–34.0)
MCHC: 33.9 g/dL (ref 32.0–36.0)
MCV: 95 fL (ref 80–100)
PLATELETS: 157 10*3/uL (ref 150–440)
RBC: 4.68 10*6/uL (ref 4.40–5.90)
RDW: 14.2 % (ref 11.5–14.5)
WBC: 7.1 10*3/uL (ref 3.8–10.6)

## 2014-11-26 LAB — TROPONIN I

## 2014-11-26 LAB — ETHANOL: Ethanol: 187 mg/dL

## 2014-11-26 LAB — SALICYLATE LEVEL: SALICYLATES, SERUM: 2 mg/dL

## 2015-02-26 NOTE — Consult Note (Signed)
Chief Complaint:  Subjective/Chief Complaint Pt states mid-abdominal pain 5/10 at worst.  c/o nausea, no vomiting.   VITAL SIGNS/ANCILLARY NOTES: **Vital Signs.:   03-Aug-15 05:46  Vital Signs Type Routine  Temperature Temperature (F) 97.5  Celsius 36.3  Temperature Source oral  Pulse Pulse 53  Respirations Respirations 18  Systolic BP Systolic BP 144  Diastolic BP (mmHg) Diastolic BP (mmHg) 78  Mean BP 97  Pulse Ox % Pulse Ox % 95  Pulse Ox Activity Level  At rest  Oxygen Delivery Room Air/ 21 %   Brief Assessment:  GEN well developed, well nourished, no acute distress, A/Ox3   Cardiac Regular   Respiratory normal resp effort  no use of accessory muscles   Gastrointestinal Normal   Gastrointestinal details normal Soft   EXTR negative cyanosis/clubbing, negative edema   Lab Results: Hepatic:  03-Aug-15 03:41   Bilirubin, Total 0.2  Alkaline Phosphatase  139 (46-116 NOTE: New Reference Range 05/25/14)  SGPT (ALT) 48 (14-63 NOTE: New Reference Range 05/25/14)  SGOT (AST) 30  Total Protein, Serum 6.7  Albumin, Serum  2.8  Routine Chem:  03-Aug-15 03:41   Triglycerides, Serum 187 (Result(s) reported on 07 Jun 2014 at 04:42AM.)  Glucose, Serum 89  BUN  3  Creatinine (comp) 0.62  Sodium, Serum 136  Potassium, Serum 3.7  Chloride, Serum 103  CO2, Serum 25  Calcium (Total), Serum  8.3  Osmolality (calc) 268  eGFR (African American) >60  eGFR (Non-African American) >60 (eGFR values <80m/min/1.73 m2 may be an indication of chronic kidney disease (CKD). Calculated eGFR is useful in patients with stable renal function. The eGFR calculation will not be reliable in acutely ill patients when serum creatinine is changing rapidly. It is not useful in  patients on dialysis. The eGFR calculation may not be applicable to patients at the low and high extremes of body sizes, pregnant women, and vegetarians.)  Anion Gap 8  Lipase  856 (Result(s) reported on 07 Jun 2014  at 04:47AM.)  Routine Hem:  03-Aug-15 03:41   WBC (CBC) 7.9  RBC (CBC)  3.81  Hemoglobin (CBC)  11.8  Hematocrit (CBC)  36.8  Platelet Count (CBC) 330  MCV 97  MCH 30.9  MCHC 32.0  RDW 14.5  Neutrophil % 68.1  Lymphocyte % 22.3  Monocyte % 7.7  Eosinophil % 1.3  Basophil % 0.6  Neutrophil # 5.4  Lymphocyte # 1.8  Monocyte # 0.6  Eosinophil # 0.1  Basophil # 0.0 (Result(s) reported on 07 Jun 2014 at 07:17AM.)   Assessment/Plan:  Assessment/Plan:  Assessment Uncomplicated ETOH pancreatitis:  Slowly improving with supportive measures.   Plan Continue supportive care with fluids and pain medications Please call if you have any questions or concerns   Electronic Signatures: JAndria Meuse(NP)  (Signed 03-Aug-15 09:43)  Authored: Chief Complaint, VITAL SIGNS/ANCILLARY NOTES, Brief Assessment, Lab Results, Assessment/Plan   Last Updated: 03-Aug-15 09:43 by JAndria Meuse(NP)

## 2015-02-26 NOTE — Consult Note (Signed)
PATIENT NAME:  Shaun Odonnell, Shaun Odonnell MR#:  [id number] DATE OF BIRTH:  [birth year]  DATE OF CONSULTATION:  05/28/2014  REFERRING PHYSICIAN:   CONSULTING PHYSICIAN:  Joelene Millin A. Jerelene Redden, ANP (Adult Nurse Practitioner)  REFERRING PHYSICIAN: Dr. Waldron Labs.  CONSULTING PHYSICIAN: Gaylyn Cheers, M.D./Melody Savidge A. Jerelene Redden, ANP.   PRIMARY CARE PROVIDER: Dr.  Frazier Richards.  REASON FOR CONSULTATION: Acute pancreatitis.   HISTORY OF PRESENT ILLNESS: This 49 year old patient with history of alcohol abuse with history of alcoholic pancreatitis and ARDS syndrome in the past, was admitted to the hospital for acute episode of abdominal pain, nausea, vomiting associated with an alcohol binge. He presented to the hospital, 05/27/2014, reporting abdominal pain that started yesterday morning. He had multiple episodes of vomiting all day long and could not get comfortable lying on his left side. He presented to the hospital with a lipase of 4127. He was subsequently admitted and has been receiving IV fluids, Dilaudid pain medication, and reports he still has about 9 out of 10 abdominal pain. The patient is tolerating ice chips and is requesting increasing diet.   PAST MEDICAL HISTORY:  1. Alcohol abuse.  2. Alcoholic-induced pancreatitis with history of ARDS.  3. Tobacco abuse.  4. Schizoaffective disorder.  5. History of pulmonary embolism, prior anticoagulation.  6. History of pneumonia.  7. Dyslipidemia.  8. Hypertension.  9.  Pancreatitis diagnosed in 2005, required intensive care unit admission for ARDS, was on a ventilator, hospitalized at [facility].  10. Sleep apnea. 11. History of dyslipidemia.  12. History of opiate abuse with overdose, 2005.  13. GI history of upper endoscopy, 05/09/2012, for epigastric pain with findings of normal esophagus and diffuse mild inflammation characterized by erosions, erythema, and granularity found in the gastric body and gastric antrum. Biopsies positive for mild  chronic nonspecific gastritis, and reactive gastropathy. Negative Helicobacter pylori.  14. History of adenomatous colon polyps with most recent colonoscopy, 2013, and he is due for a repeat colonoscopy, 06/2017.  15. Obesity. 16. History of hepatic steatosis.  PAST SURGICAL HISTORY: Left inguinal hernia repair in 1983.    HOME MEDICATIONS:  1. Lisinopril 2.5 mg oral daily.  2. Simvastatin 40 mg oral daily.  3. Zyprexa 15 mg oral daily.  4. Alprazolam 0.5 mg 4 times daily.  5. Prilosec 20 mg oral daily.   SOCIAL HISTORY: Drinks a 6 pack of beer every 2 weeks. The patient lives alone. He is unemployed. Reports tobacco, 3 to 4 cigarettes per day. He has cut back on his tobacco use. He denies illicit drug use.   REVIEW OF SYSTEMS: 10 systems reviewed. Positive for nausea, vomiting, abdominal pain as noted. He was seen in the [facility] office in May for epigastric pain. Lipase at that time was 43. He does report abdominal pain as noted. No chills or fever. He denies chest pain, cough, shortness of breath. Feels nauseated. He is hungry. The remaining 10 systems otherwise negative.   PHYSICAL EXAMINATION:  VITAL SIGNS: 97.4, 90, 18, with 137/91. Pulse oximetry is variable 95% to 92% on room air. Temperature maximum was 99.1.  GENERAL: Obese Caucasian male resting in bed and he looks mildly ill.  HEENT: Head is normocephalic. Conjunctivae pink. Sclerae anicteric. Oral mucosa is moist and intact.  NECK: Supple. Trachea midline.  CARDIAC: S1, S2 without murmur or gallop.  LUNGS: CTA. He does have rales in the left lower base only. He is moving air well. No cough noted.  ABDOMEN: Obese, positive distended, positive tenderness epigastric area  with very light palpation. Deep palpation not performed. Bowel sounds are present.  RECTAL: Deferred.  EXTREMITIES: Without edema, cyanosis, or clubbing.  SKIN: Warm and dry. No rash noted.  PSYCHIATRIC: He is alert. Affect is within normal.  Cooperative.  NEUROLOGIC: Cranial nerves grossly intact. No tremors noted. Gait not evaluated.   ADMISSION LABORATORY: Notable for glucose 153, BUN 14, creatinine 0.78, estimated GFR is greater than 60. Sodium 133, potassium 3.7, calcium 9.1. Lipase 4127, total albumin 4.2, total bilirubin 0.6, alkaline phosphatase 129, AST 38, ALT 64. Troponin less than 0.02. WBC 16.9, hemoglobin 16.6, hematocrit 50.3. Neutrophils elevated at 15.4.   Urinalysis positive for bilirubin, ketones, RBC, a few WBCs, no bacteria seen.   Repeat laboratory studies this morning with BUN 12, creatinine 0.76, calcium down 7.9, lipase down 3359. WBC 11.5, hemoglobin 14.0, hematocrit down 42.2%.   Abdominal ultrasound performed shows hepatic echogenicity diffusely increased as previously demonstrated. This remains consistent with steatosis. The pancreas is obscured by bowel gas and not visualized. The spleen has normal appearance. No aneurysm identified. No evidence of gallbladder or biliary disease.   Baseline laboratory study, 02/22/2014, with albumin 4.7, total bilirubin 0.5, alkaline phosphatase 85, AST 19, ALT 29.   IMPRESSION: Acute pancreatitis. The patient has had mild events of discomfort like this, he says intermittently, and I question whether he could have had chronic pancreatitis. He had a normal lipase value in May. He had normal pancreas per remote CT study, 2013.   Would recommend supportive care with IV fluids. The patient had IV in today, 600. Urine output today, 400. We will repeat a stat labs to include  CBC to make sure he has adequate hydration. He is receiving ice chips, would not advance diet secondary to considerable abdominal pain and discomfort. With abdominal distention, and lack of bowel movement, we will need to rule out constipation, or pancreatitis versus narcotic ileus. We will obtain abdominal x-ray. With crackles left base, we will obtain chest x-ray, PA and lateral. Begin incentive spirometry  q.2 h. while awake.   Leukocytosis noted mildly, no indication for antibiotics with initial pancreatitis. We will monitor results on chest x-ray.   This case was discussed with Dr. Vira Agar in collaboration of care. Further GI recommendations pending his clinical course.   Thank you for the consultation.   These services provided by Joelene Millin A. Jerelene Redden, ANP, under collaborative agreement with Manya Silvas, M.D.    ____________________________ Janalyn Harder. Jerelene Redden, ANP (Adult Nurse Practitioner) kam:jr D: 05/28/2014 17:20:56 ET T: 05/28/2014 18:50:25 ET JOB#: [id number]  cc: Joelene Millin A. Jerelene Redden, ANP (Adult Nurse Practitioner), <Dictator> Janalyn Harder Sherlyn Hay, MSN, ANP-BC Adult Nurse Practitioner ELECTRONICALLY SIGNED 05/31/2014 10:13

## 2015-02-26 NOTE — H&P (Signed)
PATIENT NAME:  Shaun Odonnell, Shaun Odonnell MR#:  [id number] DATE OF BIRTH:  1966-01-31  DATE OF ADMISSION:  06/05/2014  REFERRING PHYSICIAN: Dr. Cinda Quest    PRIMARY CARE PHYSICIAN: Frazier [facility].   CHIEF COMPLAINT: Abdominal pain.   HISTORY OF PRESENT ILLNESS: A 49 year old Caucasian male with history of schizoaffective disorder, depression, not otherwise specified, presenting with abdominal pain recently discharged from [facility] on 06/03/2014 with a discharge diagnosis of pancreatitis secondary to alcohol. States symptoms never completely resolved, however, they have worsened over the last 2 days and now describes abdominal pain, epigastric in location, sharp, achy in quality, 8 to 9 out of 10 in intensity, nonradiating, no relieving factors. Worse with p.o. intake. He also describes associated nausea, vomiting, diarrhea, nonbloody, nonbilious emesis, multiple loose bowel movements and complains of being unable to tolerate p.o.   REVIEW OF SYSTEMS: CONSTITUTIONAL: Denies fever, fatigue, weakness.  EYES: Denies blurred vision, double vision, eye pain.  EARS, NOSE  AND THROAT: Denies any tinnitus or hearing loss.   RESPIRATORY: Denies cough, wheeze or shortness of breath.  CARDIOVASCULAR: Denies chest pain, palpitations, edema.  GASTROINTESTINAL: Positive for nausea, vomiting, diarrhea, abdominal pain as described above.  GENITOURINARY: Denies dysuria, hematuria.  ENDOCRINE: Denies nocturia or thyroid problems.  HEMATOLOGIC AND LYMPHATIC: Denies easy bruising, bleeding.  SKIN: Denies rash or lesions.  MUSCULOSKELETAL: Denies pain in neck, back, shoulder, knees, hips or arthritic symptoms.  NEUROLOGIC: Denies paralysis, paresthesias.  PSYCHIATRIC: Denies anxiety or depressive symptoms. (Dictation Anomaly) <MISSING TEXT>   The remainder of the review of systems reviewed by me is negative.   PAST MEDICAL HISTORY: Schizoaffective disorder, depression, hypertension,  hyperlipidemia, recent discharge diagnosis of alcohol pancreatitis.   SOCIAL HISTORY: Positive for tobacco use 3 to 4 cigarettes daily as well as alcohol use though no recent alcohol intake.   FAMILY HISTORY: Positive for coronary artery disease.   ALLERGIES: KLONOPIN AS WELL AS STELAZINE.    HOME MEDICATIONS: Lisinopril 2.5 mg p.o. q. daily, Zoloft 100 mg p.o. q. daily, simvastatin 40 mg p.o. q. daily, Zyprexa 50 mg p.o. q. daily, alprazolam 0.25 mg p.o. q. 3 hours as ordered by psych on last admission. Prilosec 20 mg p.o. b.i.d., folic acid 1 mg p.o. daily.   PHYSICAL EXAMINATION:  VITAL SIGNS: Temperature 98.1, heart rate 82, respirations 18, blood pressure 145/71 saturating 95% on room air. Weight 102.1 kg, BMI 34.2.  GENERAL: Well-nourished, well-developed, Caucasian gentleman currently in no acute distress.  HEAD: Normocephalic, atraumatic.  EYES: Pupils equal, round, reactive to light. Extraocular muscles intact.  No scleral icterus.  MOUTH: Moist mucous membranes. Dentition intact. No abscess noted.  NECK: Supple. No thyromegaly. No nodules. No JVD.  PULMONARY: Clear to auscultation bilaterally without wheezes, rales or rhonchi.  No use of accessory muscles. Good respiratory effort.  CHEST: Nontender to palpation.  CARDIOVASCULAR: S1, S2, regular rate and rhythm. No murmurs, rubs or gallops. No edema with 2+ pulses.  GASTROINTESTINAL: Abdomen was noted soft, no tenderness to palpation over epigastric region, without rebound or guarding. No motion tenderness. Positive bowel sounds. No hepatosplenomegaly.  MUSCULOSKELETAL: No swelling, clubbing, or edema. Range of motion full in all extremities.  NEUROLOGIC: Cranial nerves II through XII intact. No gross focal neurologic deficits. Sensation intact. Reflexes intact.  SKIN: No ulceration, lesions, rashes, or cyanosis. Skin warm, dry. Turgor intact.  PSYCHIATRIC: Mood and affect within normal limits. The patient is awake, alert, oriented x  3. Insight and judgment intact.    LABORATORY DATA: Sodium 139,  potassium 3.9, chloride 106, bicarbonate 27, BUN 7, creatinine 0.8, glucose 158, lipase 1118. LFTs, albumin 2.9, alkaline phosphatase 1758, ALT (Dictation Anomaly) <MISSING TEXT> . WBC 12.1, hemoglobin 13.6, platelets of 382,000. He had an abdominal ultrasound performed last admission no biliary cause for pancreatitis. Triglycerides within normal limits.   ASSESSMENT AND PLAN: A 49 year old Caucasian gentleman with a history of schizoaffective disorder, as well as depression who presents with abdominal pain, found to have pancreatitis.   1. Pancreatitis. Intravenous hydration with normal saline and pain control. Given his recent presentation, no indication for repeat ultrasound or lipid panel. We will follow up on CT of the abdomen and pelvis which has been ordered by ER staff. We will ideally hold the Zyprexa as this can cause pancreatitis though we will need another agent to help control his psychiatric symptoms. Will likely require a psychiatric consult for medication adjustments.  2. Hyperglycemia. Add insulin sliding scale, q. 6 hour Accu-Cheks.  3. Schizoaffective disorder, hold Zyprexa given number 1, however, we will likely need psych consult for medication adjustments and alternatives agents.  4. Hypertension. Continue lisinopril.  5. Venous thromboembolism prophylaxis. Heparin subcutaneously.   CODE STATUS: Patient is a full code.   TIME SPENT: 45 minutes.     ____________________________ Aaron Mose. Hower, MD dkh:jh D: 06/05/2014 04:24:20 ET T: 06/05/2014 04:50:46 ET JOB#: [id number]  cc: Aaron Mose. Hower, MD, <Dictator> DAVID Woodfin Ganja MD ELECTRONICALLY SIGNED 06/05/2014 20:26

## 2015-02-26 NOTE — Consult Note (Signed)
PATIENT NAME:  Shaun Odonnell, Shaun Odonnell MR#:  [id number] DATE OF BIRTH:  1966-01-05  DATE OF CONSULTATION:  06/05/2014  REFERRING PHYSICIAN:   CONSULTING PHYSICIAN:  [name], MD  CONSULTING SERVICE:  Gastroenterology.  REASON FOR CONSULTATION: Pancreatitis.   HISTORY OF PRESENT ILLNESS: This patient is a 49 year old gentleman who was discharged from the hospital 2 days ago after being followed by Dr. Vira Agar for acute pancreatitis. The patient has a history alcohol abuse. The patient had come in on the previous admission with a lipase over 3000, and then he was discharged with a lipase of 500. The patient states that he went home. He ate a sandwich and he ate some eggs and had continued pain. He states that his pain never went away, but then when he started to have diarrhea, that started the day prior to admission, he decided to come back to the hospital. He states that the pain is sharp in intensity. He states that his last bout of diarrhea was 2:00 in the morning last night, and he has now had trouble moving his bowels to give a stool sample. The patient states that he does mostly binge drinking with a 24 pack just prior to having his first admission, but states he has not drank at all since being discharged 2 days ago.   REVIEW OF SYSTEMS: Noncontributory, except what was stated above.   PAST MEDICAL HISTORY: Schizophrenia, depression, hypertension, hyperlipidemia, and recent diagnosis of alcoholic pancreatitis.   SOCIAL HISTORY: Positive for tobacco and alcohol.   FAMILY HISTORY: Noncontributory.   ALLERGIES: KLONOPIN AND STELAZINE.   HOME MEDICATIONS: Lisinopril, Zoloft, simvastatin, Zyprexa, alprazolam, Prilosec, and folic acid.   PHYSICAL EXAMINATION:  GENERAL: The patient was in the bathroom when I came to see him, and he walked out of the bathroom without any problems and appeared in no apparent distress.  VITAL SIGNS: Temperature 97.9, pulse 64, respirations 18, blood pressure 134/80,  pulse oximetry 92% on room air.  HEENT: Normocephalic, atraumatic. Extraocular motor intact. Pupils equally round and reactive to light and accommodation without JVD, without lymphadenopathy.  LUNGS: Clear to auscultation bilaterally.  HEART: Regular rate and rhythm without murmurs, rubs, or gallops.  ABDOMEN: Soft with some mid-epigastric tenderness without rebound, without guarding.  EXTREMITIES: Without cyanosis, clubbing, or edema.  SKIN: With some ecchymotic areas where he is getting subcutaneous injections on his stomach without any other rashes or lesions.  NEUROLOGICAL: Grossly intact.  PSYCHIATRIC: Alert and oriented x 3.   LABORATORY DATA: As stated above, the patient's lipase this morning was 1118 with his ALT of 82, AST of 29, and alkaline phosphatase of 75. His white cell count was slightly elevated at 12.1. The patient had a CT scan of the abdomen that showed inflammatory changes around the pancreas consistent with acute pancreatitis, without any ductal dilatation or other abnormalities seen, except for diffuse fatty infiltrate of the liver.   ASSESSMENT AND PLAN: This patient is a 49 year old gentleman with alcoholic pancreatitis who was just recently discharged from the hospital for the same. The patient should receive  similar care as his last admission including hydration and pain medication and resting the pancreas. No further recommendations from a gastrointestinal point of view.   Thank you very much for involving me in the care of this patient. If you have any questions, please do not hesitate to call.    ____________________________ [name], MD dw:ts D: 06/05/2014 11:42:35 ET T: 06/05/2014 13:52:39 ET JOB#: [id number]  cc: [name], MD, <Dictator>  [name] MD ELECTRONICALLY SIGNED 06/06/2014 8:54

## 2015-02-26 NOTE — Consult Note (Signed)
Pt requesting stronger pain medicine.  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  He may be near discharge time.  High chance of resuming alcohol.gabapentin may play a roll in control of his pain but will leave that up to Dr. Ouida Sills.    Electronic Signatures: Manya Silvas (MD)  (Signed on 27-Jul-15 17:54)  Authored  Last Updated: 27-Jul-15 17:54 by Manya Silvas (MD)

## 2015-02-26 NOTE — Consult Note (Signed)
Pt with alcohol induced pancreatitis.  Long hx of same.  He rates his pain at 9/10.  Abd mild distended, tender, chest clear anterior fields.  T max 99.8, getting iv fluids, less hemoconcentration on today labs. K 3.3, hgb 14.6, WBC 13.6, Abd film shows ileus. Check magnesium with next blood draw, continue hydration, incentive spirometry.  Pt reports he once went 5 years without alcohol, I encouraged him to do it again.  Electronic Signatures: Manya Silvas (MD)  (Signed on 25-Jul-15 09:42)  Authored  Last Updated: 25-Jul-15 09:42 by Manya Silvas (MD)

## 2015-02-26 NOTE — H&P (Signed)
PATIENT NAME:  Shaun Odonnell, VOLAND MR#:  [id number] DATE OF BIRTH:  [birth year]  DATE OF ADMISSION:  05/27/2014  REFERRING PHYSICIAN: Dr. Carrie Mew.   PRIMARY CARE PHYSICIAN: Dr. Frazier Richards.   CHIEF COMPLAINT: Abdominal pain, nausea and vomiting.   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 The patient reports his symptoms have been going on all day today which prompted him to come to the ED. Reports pain is epigastric, aching sharp quality, nonradiating, accompanied by nausea and vomiting. Denies fever, chills, diarrhea, chest pain, sweating, flank pain or testicular pain. Reports it is provoked by food, relieved by nothing. In the ED, the patient had basic workup which revealed he has a lipase level of 4100. The patient initially denied any alcohol abuse, but his parents were at bedside and they reported the patient has been drinking heavily recently. Report he lives by himself. They are not with him all of the time but report last week he had an episode of heavy drinking where he got intoxicated actually. Reports that he usually drinks whatever he can get his hands on. The patient reports his he has decreased significantly on his drinking, though. The patient's pain improved with IV Dilaudid in the ED. Had no further nausea and vomiting in the ED. Hospitalist service was requested to admit the patient.   PAST MEDICAL HISTORY:  1. Alcohol abuse.  2. Tobacco abuse.  3. Schizoaffective disorder.  4. History of pulmonary embolism.  Used to be on anticoagulation but not anymore.  5. Alcohol-induced pancreatitis.  6. Acute respiratory distress syndrome secondary to pancreatitis.  7. History of pneumonia.  8.  Dyslipidemia.  9. Hypertension.   ALLERGIES: KLONOPIN AND STELAZINE. The patient tolerates Xanax.   SOCIAL HISTORY: The patient lives alone. Denies any illicit drug use. Reports smokes 3 to 4 cigarettes per day. He reported he does not drink anymore or cut back significantly on his drinking but parents at bedside report the patient still has heavy drinking habits.   FAMILY HISTORY: No history of coronary artery disease at a young age.   HOME MEDICATIONS:  1. Lisinopril 2.5 mg oral daily.  2. Simvastatin 40 mg oral daily.  3. Zyprexa 15 mg oral daily.  4. Alprazolam 0.5 mg 4 times a day.  5. Prilosec 20 mg oral daily.   REVIEW OF SYSTEMS:  CONSTITUTIONAL: Denies fever, chills, fatigue, weakness. Reports loss of appetite.  EYES: Denies blurry vision, double vision, inflammation.  EARS, NOSE, THROAT: Denies tinnitus, ear pain, hearing loss, epistaxis.  RESPIRATORY: Denies cough, wheezing, hemoptysis, dyspnea.  CARDIOVASCULAR: Denies chest pain, edema, arrhythmia, palpitation or syncope.  GASTROINTESTINAL: Reports nausea, vomiting, abdominal pain. Denies diarrhea, constipation, hematemesis, coffee-ground emesis.  GENITOURINARY: Denies dysuria, hematuria or renal colic.  ENDOCRINE: Denies polyuria, polydipsia, heat or cold intolerance.  HEMATOLOGIC: Denies anemia, easy bruising.  INTEGUMENT: Denies acne, rash or skin lesions.  MUSCULOSKELETAL: Denies any swelling, gout, arthritis, cramps.  NEUROLOGIC: Denies CVA, TIA, tremors, vertigo, dementia, headaches.  PSYCHIATRIC: Denies anxiety, insomnia or depression.   PHYSICAL EXAMINATION:  VITAL SIGNS: Temperature 98.6, pulse 85, respiratory rate 18, blood pressure 131/105, saturating 95% on room air.  GENERAL: Well-nourished male, looks comfortable in bed, in no apparent distress.  HEENT: Head atraumatic, normocephalic. Pupils equal, reactive to light. Pink conjunctivae. Anicteric sclerae.  Dry oral mucosa.  NECK: Supple. No thyromegaly. No  JVD.  CHEST: Good air entry bilaterally. No wheezing, rales or rhonchi.  CARDIOVASCULAR: S1, S2 heard. No rubs, murmurs or gallops.  ABDOMEN: Obese, epigastric tenderness to palpation. No rebound. No guarding. Negative Murphy sign. Bowel sounds present.  EXTREMITIES: No edema. No clubbing. No cyanosis. Pedal and radial pulses +2 bilaterally.  PSYCHIATRIC: Appropriate affect. Awake, alert x 3. Intact judgment and insight.  NEUROLOGIC: Cranial nerves grossly intact. Motor 5 out of 5. No focal deficits.  MUSCULOSKELETAL: No joint effusion or erythema.   PERTINENT LABORATORIES: Glucose 153, BUN 14, creatinine 0.78, sodium 133, potassium 3.7, chloride 97, CO2 25. ALT 64, AST 38, alk phos 129. Troponin less than 0.02. White blood cells 16.9, hemoglobin 16.6, hematocrit 50.3, platelet 220. Urinalysis negative for leukocyte esterase and nitrite.   ASSESSMENT AND PLAN:  1. Acute pancreatitis: This is most likely related to alcohol abuse, but given his borderline LFTs, will check a right upper quadrant ultrasound to rule out any biliary dilatation or gallstones. As well, will check lipid panel. Will keep him n.p.o. except medications. Will start on aggressive intravenous fluid hydration. Will keep on p.r.n. nausea and pain medicine. Will consult gastroenterology service as well.  2. Elevated LFTs: Will check abdominal ultrasound. Will hold his statin.  3. Alcohol abuse: The patient will be started on CIWA protocol.  4. Tobacco abuse: The patient will be started on nicotine patch.  5. Dyslipidemia: Will hold his statin. Will resume when the patient is more stable.  6. Hypertension: Blood pressure is mildly elevated. Continue his lisinopril.  7. History of schizoaffective disorder: Continue the patient on Zyprexa.  8. Deep vein thrombosis prophylaxis: Subcutaneous heparin.   CODE STATUS: FULL CODE.   TOTAL TIME SPENT ON ADMISSION AND PATIENT CARE: 50 minutes.   ____________________________ Shaun Patricia, MD dse:gb D: 05/28/2014 00:08:01 ET T: 05/28/2014 01:00:56 ET JOB#: [id number]  cc: Shaun Patricia, MD, <Dictator> Maykel Reitter Graciela Husbands MD ELECTRONICALLY SIGNED 05/28/2014 23:28

## 2015-02-26 NOTE — Consult Note (Signed)
PATIENT NAME:  Shaun Odonnell, Shaun MR#:  [id number] DATE OF BIRTH:  1966/11/01  DATE OF CONSULTATION:    REFERRING PHYSICIAN:   CONSULTING PHYSICIAN:  Janalyn Harder. Jerelene Redden, ANP (Adult Nurse Practitioner)  REFERRING PHYSICIAN: Dr. Waldron Labs.  CONSULTING PHYSICIAN: Gaylyn Cheers, M.D./Rennie Hack A. Jerelene Redden, ANP.   PRIMARY CARE PROVIDER: Dr.  Frazier Richards.  REASON FOR CONSULTATION: Acute pancreatitis.   HISTORY OF PRESENT ILLNESS: This 49 year old patient with history of alcohol abuse with history of alcoholic pancreatitis and ARDS syndrome in the past, was admitted to the hospital for acute episode of abdominal pain, nausea, vomiting associated with an alcohol binge. He presented to the hospital, 05/27/2014, reporting abdominal pain that started yesterday morning. He had multiple episodes of vomiting all day long and could not get comfortable lying on his left side. He presented to the hospital with a lipase of 4127. He was subsequently admitted and has been receiving IV fluids, Dilaudid pain medication, and reports he still has about 9 out of 10 abdominal pain. The patient is tolerating ice chips and is requesting increasing diet.   PAST MEDICAL HISTORY:  1. Alcohol abuse.  2. Alcoholic-induced pancreatitis with history of ARDS.  3. Tobacco abuse.  4. Schizoaffective disorder.  5. History of pulmonary embolism, prior anticoagulation.  6. History of pneumonia.  7. Dyslipidemia.  8. Hypertension.  9.    Pancreatitis diagnosed in 2005, required intensive care unit admission for ARDS, was on a ventilator, hospitalized at [facility].  10. Sleep apnea. 11. History of dyslipidemia.  12. History of opiate abuse with overdose, 2005.  13. GI history of upper endoscopy, 05/09/2012, for epigastric pain with findings of normal esophagus and diffuse mild inflammation characterized by erosions, erythema, and granularity found in the gastric body and gastric antrum. Biopsies positive for mild chronic  nonspecific gastritis, and reactive gastropathy. Negative Helicobacter pylori.  14. History of adenomatous colon polyps with most recent colonoscopy, 2013, and he is due for a repeat colonoscopy, 06/2017.  15. Obesity. 16. History of hepatic steatosis.  PAST SURGICAL HISTORY: Left inguinal hernia repair in 1983.    HOME MEDICATIONS:  1. Lisinopril 2.5 mg oral daily.  2. Simvastatin 40 mg oral daily.  3. Zyprexa 15 mg oral daily.  4. Alprazolam 0.5 mg 4 times daily.  5. Prilosec 20 mg oral daily.   SOCIAL HISTORY: Drinks a 6 pack of beer every 2 weeks. The patient lives alone. He is unemployed. Reports tobacco, 3 to 4 cigarettes per day. He has cut back on his tobacco use. He denies illicit drug use.   REVIEW OF SYSTEMS: 10 systems reviewed. Positive for nausea, vomiting, abdominal pain as noted. He was seen in the [place] office in May for epigastric pain. Lipase at that time was 43. He does report abdominal pain as noted. No chills or fever. He denies chest pain, cough, shortness of breath. Feels nauseated. He is hungry. The remaining 10 systems otherwise negative.   PHYSICAL EXAMINATION:  VITAL SIGNS: 97.4, 90, 18, with 137/91. Pulse oximetry is variable 95% to 92% on room air. Temperature maximum was 99.1.  GENERAL: Obese Caucasian male resting in bed and he looks mildly ill.  HEENT: Head is normocephalic. Conjunctivae pink. Sclerae anicteric. Oral mucosa is moist and intact.  NECK: Supple. Trachea midline.  CARDIAC: S1, S2 without murmur or gallop.  LUNGS: CTA. He does have rales in the left lower base only. He is moving air well. No cough noted.  ABDOMEN: Obese, positive distended, positive tenderness  epigastric area with very light palpation. Deep palpation not performed. Bowel sounds are present.  RECTAL: Deferred.  EXTREMITIES: Without edema, cyanosis, or clubbing.  SKIN: Warm and dry. No rash noted.  PSYCHIATRIC: He is alert. Affect is within normal. Cooperative.   NEUROLOGIC: Cranial nerves grossly intact. No tremors noted. Gait not evaluated.   ADMISSION LABORATORY: Notable for glucose 153, BUN 14, creatinine 0.78, estimated GFR is greater than 60. Sodium 133, potassium 3.7, calcium 9.1. Lipase 4127, total albumin 4.2, total bilirubin 0.6, alkaline phosphatase 129, AST 38, ALT 64. Troponin less than 0.02. WBC 16.9, hemoglobin 16.6, hematocrit 50.3. Neutrophils elevated at 15.4.   Urinalysis positive for bilirubin, ketones, RBC, a few WBCs, no bacteria seen.   Repeat laboratory studies this morning with BUN 12, creatinine 0.76, calcium down 7.9, lipase down 3359. WBC 11.5, hemoglobin 14.0, hematocrit down 42.2%.   Abdominal ultrasound performed shows hepatic echogenicity diffusely increased as previously demonstrated. This remains consistent with steatosis. The pancreas is obscured by bowel gas and not visualized. The spleen has normal appearance. No aneurysm identified. No evidence of gallbladder or biliary disease.   Baseline laboratory study, 02/22/2014, with albumin 4.7, total bilirubin 0.5, alkaline phosphatase 85, AST 19, ALT 29.   IMPRESSION: Acute pancreatitis. The patient has had mild events of discomfort like this, he says intermittently, and I question whether he could have had chronic pancreatitis. He had a normal lipase value in May. He had normal pancreas per remote CT study, 2013.   Would recommend supportive care with IV fluids. The patient had IV in today, 600. Urine output today, 400. We will repeat a stat labs with CBC to make sure he has adequate hydration. He is receiving ice chips, would not advance diet secondary to considerable abdominal pain and discomfort. With abdominal distention, and lack of bowel movement, we will need to rule out constipation, or pancreatitis induced ileus versus narcotic ileus. We will obtain abdominal x-ray. With crackles left base, we will obtain chest x-ray, PA and lateral. Begin incentive spirometry q.2 h.  while awake.   Leukocytosis noted mildly, no indication for antibiotics with initial pancreatitis. We will monitor results on chest x-ray.   This case was discussed with Dr. Vira Agar in collaboration of care. Further GI recommendations pending his clinical course.   Thank you for the consultation.   These services provided by Joelene Millin A. Jerelene Redden, ANP, under collaborative agreement with Manya Silvas, M.D.    ____________________________ Janalyn Harder. Jerelene Redden, ANP (Adult Nurse Practitioner) kam:jr D: 05/28/2014 17:20:56 ET T: 05/28/2014 18:46:36 ET JOB#: [id number]  cc: Joelene Millin A. Jerelene Redden, ANP (Adult Nurse Practitioner), <Dictator> Janalyn Harder Sherlyn Hay, MSN, ANP-BC Adult Nurse Practitioner ELECTRONICALLY SIGNED 05/31/2014 10:09

## 2015-02-26 NOTE — Consult Note (Signed)
Pt on full liquid diet.  If does ok today can advance to low fat low cholesterol diet and likely go home  I will sign off, please reconsult if needed.  Electronic Signatures: Manya Silvas (MD)  (Signed on 28-Jul-15 12:59)  Authored  Last Updated: 28-Jul-15 12:59 by Manya Silvas (MD)

## 2015-02-26 NOTE — Discharge Summary (Signed)
PATIENT NAME:  Shaun Odonnell, Shaun Odonnell MR#:  [id number] DATE OF BIRTH:  [birth year]  DATE OF ADMISSION:  05/27/2014 DATE OF DISCHARGE:    DISCHARGE DIAGNOSES:  1.  Alcoholic pancreatitis.  2.  Acute on chronic alcoholic hepatitis. 3.  Schizoaffective disorder.  4.  Alcoholism.  5.  Hypokalemia.   DISCHARGE MEDICATIONS: Per [facility] medical reconciliation. Basically, he will be on his usual home psychiatric regimen.   HISTORY AND PHYSICAL: Please see detailed history and physical done on admission.   HOSPITAL COURSE: The patient was admitted with abdominal pain, thought to be alcoholic pancreatitis. No gallstones were seen on a repeat ultrasound. White count was 16,900 on admission with mild hyponatremia, elevated liver enzymes were noted, lipase was 4120. WBC came down over the course of hospitalization. His sodium improved. He has some hypokalemia at times. Cholesterol level was normal.  Lipase came down markedly, as well.   There were long discussions with him about his alcohol abuse by me and his psychiatrist, Dr. Clovis Riley who graciously consulted on the patient. He is motivated to quit at this point, trying to develop the resources for him. He has a Social worker he is going to see to "help develop a treatment plan."  Also strongly advise Alcoholics Anonymous for him and gave him the rationale for that at length. He has not totally agreed on that, but does not seem to be quite as persistently against it at this point in time. He will follow up with me next week and follow up closely with psychiatry and counselors as noted.  I discussed this with the patient today.  TIME SPENT: Approximately 35 minutes to do all discharge tasks today.    ____________________________ Ocie Cornfield. Ouida Sills, MD mwa:lt D: 06/03/2014 08:04:32 ET T: 06/03/2014 08:12:44 ET JOB#: [id number]  cc: Ocie Cornfield. Ouida Sills, MD, <Dictator> Kirk Ruths MD ELECTRONICALLY SIGNED 06/07/2014 7:50

## 2015-02-26 NOTE — Discharge Summary (Signed)
PATIENT NAME:  Shaun Odonnell, Shaun Odonnell MR#:  [id number] DATE OF BIRTH:  04-21-1966  DATE OF ADMISSION:  07/30/2014 DATE OF DISCHARGE:    IDENTIFYING INFORMATION: The patient is a 49 year old Caucasian male who carries a diagnosis of schizoaffective disorder.   CHIEF COMPLAINT: "The voices are bad, I hear them through the TV, they say I can't have coffee after 5:00 and I can smoke outside after 1:00 a.m., they can see me in the house when I move around, the refrigerator and the [facility] are being controlled.    DISCHARGE DIAGNOSES:    AXIS I:  1.  Schizoaffective disorder, depressed type.   2.  Alcohol use disorder, moderate, in early full remission.   3.  Cocaine use disorder, moderate, in early full remission.  4.  Cannabis use disorder, mild, in sustained full remission.     AXIS II: Unspecified personality disorder.   AXIS III:  1.  Chronic pancreatitis.   2.  Chronic obstructive pulmonary disease.   3.  Hypertension.   4.  Hyperlipidemia.   5.  Obesity.   DISCHARGE MEDICATIONS:  Clozaril 200 mg p.o. at bedtime, ziprasidone Geodon 60 mg p.o. every 12 hours with meals, alprazolam 0.25 mg p.o. daily 4 times a day, no prescription given, lisinopril 2.5 mg p.o. daily, omeprazole 40 mg p.o. daily, sertraline 100 mg p.o. daily, simvastatin 40 mg p.o. at bedtime, metformin 500 mg p.o. b.i.d. for metabolic syndrome.   HOSPITAL COURSE: Shaun Odonnell  has been a patient of Dr. Octavia Heir (local outpatient psychiatrist) since the 1990s.  He carries a diagnosis of schizoaffective disorder bipolar type, he has been hospitalized several times in the past. He also has failed multiple medication trials (quetiapine, Abilify, chlorpromazine). He also has developed severe akathisia to olanzapine. Most recently the patient was treated with olanzapine and ziprasidone, however he was not responding and reported having worsening of auditory hallucinations and worsening anxiety. Therefore the patient was admitted to [facility]  behavioral health unit. Per his outpatient psychiatrist the recommendation was to start a trial of Clozaril as the patient has had so many failures to monotherapies with different antipsychotics. The patient agreed with a trial of Clozaril.  He was started on 50 mg p.o. daily, the dose was titrated up by 50 mg increments up to a dose of 200 mg.  With that dose the patient developed sedation and drooling and complained of having lower energy. Therefore the dose was noted increased further as it was felt that the patient needed to adjust to the 200 mg dose and perhaps to slower taper of 12.5 or 25 mg increments instead. The olanzapine was eventually tapered off, the Geodon was decreased from 80 mg twice a day to 60 mg twice a day. He tolerated these changes well without having an exacerbation of psychotic symptoms. At the time of the discharge the patient reported resolution of hallucinations, reported stable mood and denied any suicidality, homicidality, or significant problems with anxiety. During his stay it was noted that the patient was on alprazolam every 2 hours. He was strongly advised to consider a taper of this regimen with a long acting benzodiazepine, however the patient declined, but he did agree to have a slow decrease in the dose of alprazolam, so the orders were changed for him to receive 0.25 mg q.i.d. instead of every 2 hours. No evidence of withdrawal was noted at the time of the patient's discharge. I contacted the [facility] in [place], [place]. They do carry Clozaril,  we will fax the Clozaril prescription for this month along with his CBC obtained on September 30. I also have order weekly CBC for 6 months to our outpatient clinic and they have been instructed to fax the result of the CBC to [facility] outpatient laboratory,  phone number is [phone] and [facility] in [place], [place] phone number is [phone], their fax number is  [phone]. For metabolic syndrome the patient was started on metformin 500 mg p.o. b.i.d. as he already suffers from hypertension, obesity, and hyperlipidemia.  During this hospitalization there were no behavioral problems. The patient did not require any seclusion, restraints, or forced medications. No behavioral problems were reported, pleasant and cooperative throughout his stay,   [place]:  Appearance, the patient is a 49 year old obese Caucasian male who appears his stated age. He displays fair grooming and hygiene. Behavior, he was pleasant and cooperative with staff, respectful. Eye contact was within normal range. Psychomotor activity appeared mildly decreased. Speech had regular tone, volume, and rate. Thought process was linear and goal directed. Thought content was negative for suicidality, homicidality. Perception negative for psychosis. His mood is reported as euthymic, his affect reactive. Insight and judgment are fair. On cognitive examination the patient is alert in person, place, time, and situation. Attention and concentration appear to be grossly normal.  Fund of knowledge appears to be average.   LABORATORY RESULTS:,: VLDL 48, LDL 57, total cholesterol is 140, triglycerides are 241, HDL cholesterol is 35. Hemoglobin A1c is 5.7. Alcohol level at the time of admission was below detection limit. His creatinine is 0.87, his sodium is 139, his potassium 3.6. AST 31, ALT 43. TSH 0.77. Urine toxicology screen was positive for benzodiazepines as the patient takes alprazolam. WBC on the 26th is 6.4 and his ANC was 4.3. CBC on September 30 WBC 8.9 and ANC 6.6  DISCHARGE FOLLOWUP:  The patient will continue to follow up with Dr. Clovis Riley has an appointment on October 7. Patient is due for CBC on Oct 7.    DISCHARGE DISPOSITION: The patient will be discharged back to his home in [place], [place].    ____________________________ Hildred Priest, MD ahg:bu D: 08/04/2014 10:20:50 ET T: 08/04/2014 13:11:38 ET JOB#: [id number]  cc: Hildred Priest, MD, <Dictator> Rhodia Albright MD ELECTRONICALLY SIGNED 08/04/2014 15:11

## 2015-02-26 NOTE — Consult Note (Signed)
PATIENT NAME:  Shaun Odonnell, Shaun Odonnell MR#:  [id number] DATE OF BIRTH:  Feb 20, 1966  DATE OF CONSULTATION:  07/29/2014  REFERRING PHYSICIAN:   CONSULTING PHYSICIAN:  Gonzella Lex, MD  IDENTIFYING INFORMATION AND REASON FOR CONSULTATION: This is a 49 year old gentleman with a history of schizophrenia or schizoaffective disorder who came to the Emergency Room requesting treatment because his voices are worse. Consult for appropriate treatment.   HISTORY OF PRESENT ILLNESS: Information obtained from the patient and the chart. The patient states for the past month his auditory hallucinations have been getting worse. They are happening more frequently. Happened during the day while he is at his apartment, when he is out of his apartment and have even happened since he has been down here. He can tell what they are saying and they are often hostile making threatening statements to him. They cause him to feel frightened and upset. He has been having a little bit more difficulty sleeping. Feeling more anxious. Mood has been down and depressed. Activity level has been decreased. The patient denies that he has had any thoughts about killing himself or wanting to die. Denies any thoughts about doing anything violent or being aggressive. He says that his medications recently had been stable for quite some time and had not had any recent changes. He cannot identify any specific emotional change, although a little over a month ago he did have an episode of pancreatitis that put him in the hospital. Denies that he has been abusing alcohol, denies that he has been overusing his medicine or abusing drugs. The patient spoke to Dr. Octavia Heir his usual psychiatrist yesterday who started him on a new antipsychotic, but later in the day he called Dr. Octavia Heir back to say that he was feeling like he could not take it anymore. At that point, Dr. Octavia Heir encouraged him to come to the Emergency Room.   PAST PSYCHIATRIC HISTORY: The patient has  had schizophrenia with the diagnosis for many years. Possibly schizoaffective disorder. Most of his cognitive function seems to be pretty well maintained. He denies that he has ever made a serious suicide attempt. He has had hospitalizations in the past. He has been on multiple medications, but recalls Zyprexa as being particularly helpful over the long term. Based on his old notes, it looks like Dr. Octavia Heir feels he has a history of abusing benzodiazepines but nevertheless warrants continued treatment with low dose of Xanax as long as he keeps it under control. He has history of alcohol abuse as well. The patient says that he has not been drinking recently. Denies any history of violent behavior in the past. Current symptoms are pretty standard for what he gets when he is decompensated.   SUBSTANCE ABUSE HISTORY: History of abuse of alcohol in the past. He seems to have gotten that reasonably under control, says he does not drink anymore and has not had a drink in many months. There is by Dr. Starr Sinclair estimation a concern about abuse of benzodiazepines, but the patient continues to take Xanax and says that he has not been overusing it or misusing it.   FAMILY HISTORY: Knows of no family history of mental illness.   SOCIAL HISTORY: Disabled. Lives alone. Has close contact with his parents who are his closest relatives. Says he does not do much and barely gets out of bed most days. Limited social life.   PAST MEDICAL HISTORY: The patient has a history of recurrent pancreatitis. I am not sure if that is definitely  related to his alcohol abuse, but likely. Even without admitting to alcohol abuse it seems that he still sometimes gets flare-ups of the pancreatitis. Does not take usual standing medicines to manage it, but does take a proton pump inhibitor. He has dyslipidemia as well.   CURRENT MEDICATIONS: Zoloft 100 mg in the morning. Xanax 0.25 mg 6 times a day. That is what the patient says he takes, although  the prescription appears to be for 0.5 mg 4 times a day. He says he breaks them up and has discussed this with Dr. Octavia Heir. Omeprazole 40 mg per day. Geodon 80 mg twice a day, which was just started yesterday. Simvastatin 40 mg at night. Zyprexa 10 mg at night.   ALLERGIES: KLONOPIN, MORPHINE, ORAPRED, STELAZINE.   REVIEW OF SYSTEMS: Auditory hallucinations taking up much of the day causing him distress, anxiety increased, depression much worse. Denies suicidal ideation, denies visual hallucinations. Has some chronic GI upset, but not having acute pain. The rest of the full 9 point review of systems negative.   MENTAL STATUS EXAMINATION: Mildly disheveled gentleman who looks his stated age or younger, cooperative with the interview, good eye contact, normal psychomotor activity. Speech quiet, decreased in total amount but easy to understand, affect blunted. Mood stated as depressed. Thoughts are slow but lucid, mild thought blocking, did not make any bizarre statements. Endorses auditory hallucinations with angry content. Denies visual hallucinations. He is alert and oriented x4. Can recall 3/3 objects immediately and at three minutes. Long-term memory intact. Judgment and insight good. Intelligence normal.   LABORATORY RESULTS: Drug screen positive for benzodiazepines. Urinalysis: 1+ bacteria. Has been sent for culture. Nothing growing yet. CBC normal. Chemistry panel just an elevated glucose on a nonfasting draw at 132.   PHYSICAL EXAMINATION: VITAL SIGNS: Current blood pressure 129/70, respirations 18, pulse 69, temperature 98.3. NEUROLOGIC: The patient can walk without difficulty, moves all extremities. Cranial nerves symmetric.   ASSESSMENT: This is a 49 year old man with schizophrenia or schizoaffective disorder who has had a decompensation with worsening hallucinations. Possibly related to recent illness, although not clear. Symptoms have been getting worse. Medications are not helping like they  used to. He is not reporting suicidal ideation, but does feel anxious and upset about his symptoms.   TREATMENT PLAN: Since the patient has good insight and good outpatient treatment and is not reporting suicidality we discussed options for treatment. If he felt like he was too frightened and upset to go home and manage this initially, we could consider admission to the hospital, but I let him know that the unit is currently agitated and that he may want to consider that. Additionally, I offered to consider discharge home with follow up with Dr. Octavia Heir. The patient does not feel comfortable with either option necessarily right now. Request starting back on his medicine. I have put in orders for all of his current medicine. I also added something to help with sleep if he is still here at night. Psychoeducation and counseling and supportive therapy done. Reviewed old chart. We will continue to monitor him. If a bed opens up and things calm down later and he wants to be admitted we will consider doing that, otherwise, we might re-evaluate later.   DIAGNOSIS, PRINCIPAL AND PRIMARY:  AXIS I: Schizoaffective disorder, depressed type.   SECONDARY DIAGNOSES: AXIS I: Alcohol abuse, in early remission.  AXIS II: Deferred.  AXIS III: History of pancreatitis.  ____________________________ Gonzella Lex, MD jtc:[id number] D: 07/29/2014 11:18:29 ET T: 07/29/2014  11:38:36 ET JOB#: [id number]  cc: Gonzella Lex, MD, <Dictator> Gonzella Lex MD ELECTRONICALLY SIGNED 07/30/2014 23:13

## 2015-02-26 NOTE — Consult Note (Signed)
Pt abd feeling better, most of discomfort has moved into lower abdomen.  His lipase has fallen significantly from 4000 to 530.  Will start clear liquids and advance as tolerated.  He wishes to shower, will have him walk in halls first to see if he can do that before showering.  Encouraged him to use Incent Sprio device as his O2 sat is only 90 %.    Electronic Signatures: Manya Silvas (MD)  (Signed on 26-Jul-15 09:11)  Authored  Last Updated: 26-Jul-15 09:11 by Manya Silvas (MD)

## 2015-02-26 NOTE — Consult Note (Signed)
Chief Complaint:  Subjective/Chief Complaint Patient with continued abd pain. Says it may be a bit better. no further diarrhea. Lipase levels the same as yesterday. No nausea or vomiting. On a clear liquid diet now.   VITAL SIGNS/ANCILLARY NOTES: **Vital Signs.:   02-Aug-15 06:45  Vital Signs Type Routine  Temperature Temperature (F) 97.8  Celsius 36.5  Temperature Source oral  Pulse Pulse 69  Respirations Respirations 18  Systolic BP Systolic BP 583  Diastolic BP (mmHg) Diastolic BP (mmHg) 73  Mean BP 88  Pulse Ox % Pulse Ox % 91  Pulse Ox Activity Level  At rest  Oxygen Delivery Room Air/ 21 %   Brief Assessment:  GEN well developed, well nourished, no acute distress   Respiratory normal resp effort  no use of accessory muscles   Additional Physical Exam Alert and orientated times 3   Lab Results: Hepatic:  02-Aug-15 03:40   Bilirubin, Total 0.2  Alkaline Phosphatase  131 (46-116 NOTE: New Reference Range 05/25/14)  SGPT (ALT) 55 (14-63 NOTE: New Reference Range 05/25/14)  SGOT (AST) 19  Total Protein, Serum  6.2  Albumin, Serum  2.7  Routine Chem:  02-Aug-15 03:40   Result Comment CBC - SMEAR SCANNED  Result(s) reported on 06 Jun 2014 at 05:32AM.  Lipase  1118 (Result(s) reported on 06 Jun 2014 at 05:32AM.)  Glucose, Serum 65  BUN  5  Creatinine (comp) 0.66  Sodium, Serum 138  Potassium, Serum 3.6  Chloride, Serum 102  CO2, Serum 28  Calcium (Total), Serum  8.1  Osmolality (calc) 271  eGFR (African American) >60  eGFR (Non-African American) >60 (eGFR values <82m/min/1.73 m2 may be an indication of chronic kidney disease (CKD). Calculated eGFR is useful in patients with stable renal function. The eGFR calculation will not be reliable in acutely ill patients when serum creatinine is changing rapidly. It is not useful in  patients on dialysis. The eGFR calculation may not be applicable to patients at the low and high extremes of body sizes,  pregnant women, and vegetarians.)  Anion Gap 8  Routine Hem:  02-Aug-15 03:40   WBC (CBC)  10.8  RBC (CBC)  3.78  Hemoglobin (CBC)  12.1  Hematocrit (CBC)  36.8  Platelet Count (CBC) 338  MCV 98  MCH 32.2  MCHC 33.0  RDW 14.1  Neutrophil % 75.2  Lymphocyte % 16.7  Monocyte % 6.9  Eosinophil % 0.8  Basophil % 0.4  Neutrophil #  8.1  Lymphocyte # 1.8  Monocyte # 0.7  Eosinophil # 0.1  Basophil # 0.0   Assessment/Plan:  Assessment/Plan:  Assessment Pancreatitis.   Plan Continue supportive care with fluids and pain medications.  Will check an IgG4 Nothing further to add from a GI point of view.   Electronic Signatures: WLucilla Lame(MD)  (Signed 02-Aug-15 10:23)  Authored: Chief Complaint, VITAL SIGNS/ANCILLARY NOTES, Brief Assessment, Lab Results, Assessment/Plan   Last Updated: 02-Aug-15 10:23 by WLucilla Lame(MD)

## 2015-02-26 NOTE — H&P (Signed)
PATIENT NAME:  Shaun Odonnell, Shaun Odonnell MR#:  [id number] DATE OF BIRTH:  05-Jul-1966  DATE OF ADMISSION:  08/30/2014  IDENTIFYING INFORMATION: The patient is a 49 year old single Caucasian male who carries a diagnosis of schizoaffective disorder.   CHIEF COMPLAINT:  The patient complained of having psychotic symptoms. Requesting hospitalization.  HISTORY OF PRESENT ILLNESS:  This patient was recently discharged from our unit on September 30th after he was admitted and started him on Clozaril. The patient represented to the Emergency Department reporting having auditory hallucinations and feeling frightened. He reported that the voices were talking about suicide and said derogatory things about him. The patient had reported earlier in the Emergency Department having chest pain; however, any cardiac abnormality was ruled out.  The patient saw hi outpatient psychiatrist on the day of admission earlier and the patient's psychiatrist was concerned about his mental health and urged him to come into the hospital.  His outpatient psychiatrist had been titrating up the dose of Clozaril and has been close titrating the Clozaril with Geodon, and the Geodon has been successfully tapered off. However, for some reason the patient had reported refractory symptoms today.  He denied any use of alcohol or illicit substances and the patient reported being compliant with all his medications.   Today, I re-evaluated the patient after he was admitted to the behavioral health unit. He is denying having any hallucinations and denied having suicidality. He feels that after admission  his symptoms resolved and he feels ready to return home. The patient denied problems with sleep, appetite, or energy, or concentration overnight. Mood is improved.   No longer hearing voices commanding suicide or making derogatory comments about him. The patient denied having any physical complaints and denied having any chest pain.    Per record, the patient  has a history of alcohol use disorder that had been in remission.  Also has a history of cocaine use disorder and that has been in remission and cannabis use disorder in remission.   I did take care of this patient during his last admission and his last use of cocaine has been in the recent months; however, the patient denies any recent use prior to this admission.   PAST PSYCHIATRIC HISTORY: The patient has diagnosis of schizoaffective disorder. He follows up with [facility]. He sees Dr. Octavia Heir.  He is currently on Clozaril 200 mg twice a day, sertraline 100 mg in the morning, alprazolam 0.5 mg every 6 hours a day. There is no history of prior suicidal attempts.   PAST MEDICAL HISTORY: The patient has a history of COPD, hypertension, hyperlipidemia, and obesity. The patient's medications are omeprazole 40 mg daily, simvastatin 40 mg p.o. at bedtime, metformin 500 mg p.o. b.i.d., and per records he was on lisinopril 2.5 but it appears that this medication has been discontinued.   FAMILY HISTORY:  The patient reported that his great grandfather committed suicide but there is no other family history.   SOCIAL HISTORY:   The patient currently has received disability for his mental illness. He is single, never married, has no children.   ALLERGIES: Clonazepam, morphine AND ORAPRED.   REVIEW OF SYSTEMS: The patient denies any physical complaints today. Denies nausea, vomiting or diarrhea. The rest of the review of systems is negative.   MENTAL STATUS EXAMINATION: The patient is an obese 49 year old Caucasian male who appears his stated age. He displays fair grooming and hygiene. Behavior: He was calm, pleasant, and cooperative. Psychomotor activity within normal range. Eye contact  within normal range. Speech had regular tone, volume and rate. Thought process is linear and goal directed. Thought content negative for suicidality, homicidality. Perception negative for psychosis. Mood euthymic. Affect reactive.  Insight and judgment fair. On cognitive examination, the patient is alert and oriented to person, place, time and situation. Attention and concentration appear to be grossly intact; however, it was not formally tested. Fund of knowledge appears to be average; however, it was not formerly tested.   VITAL SIGNS:   Are 153/97, respirations 20, pulse 84, temperature 98.2.   MUSCULOSKELETAL: The patient has a normal muscular tone. No involuntary movements and a normal gait.   LABORATORY RESULTS:  The patient has a comprehensive metabolic panel that is within the normal limits, only showing a mild increase in alkaline phosphatase at 124. His alcohol level was below detection limit at admission. Lipase was 137. Troponins were normal. Urine test was only positive for benzodiazepines. ANC was 8.8. D-dimer was  normal. UA was negative .   His EKG shows a QTc of 113. He had sinus tachycardia.   ASSESSMENT: The patient is a 49 year old Caucasian male with a history of schizoaffective disorder and past history of substance abuse that appears to be in the most recent months in remission. The patient is currently on Clozaril and has been doing well outpatient up until today on presentation when he reported having hallucinations. The dose of Clozaril has been increased and we will continue to observe the patient to assure his safety. Collateral information will be obtained from the outpatient psychiatrist in order to determine if there were other concerns as far as safety.   DIAGNOSIS:  Schizoaffective disorder, depressed type.  Alcohol use disorder, moderate, in remission. Cocaine use disorder, moderate, in remission. Cannabis use disorder, in remission. Unspecified personality disorder, chronic pancreatitis, chronic obstructive pulmonary disease, hypertension, hyperlipidemia, and obesity.   PLAN: The patient will be observed in the unit tonight.  He will be placed on routine precautions. For psychosis he will be  continued on Clozaril 250 mg p.o. b.i.d. of Clozaril.  For depressive symptoms he will be continued on sertraline 100 mg p.o. daily for anxiety.  He will be continued on alprazolam 0.25 mg q.i.d. For hypertension, he will be released be started on lisinopril.  I actually will increase the dose to probably 10 mg as his blood pressure is elevated and was elevated also yesterday.  For GERD he will be continued on omeprazole 40 mg p.o. daily.  For metabolic syndrome, he will be continued on metformin 500 mg p.o. b.i.d.   DISCHARGE DISPOSITION:  Once the patient is stabilized he will be returned back to his home and he will continue to follow up with [facility] with Dr. Octavia Heir.   ____________________________ Hildred Priest, MD [id number] D: 08/31/2014 17:25:03 ET T: 08/31/2014 18:36:53 ET JOB#: [id number]  cc: Hildred Priest, MD, <Dictator> Rhodia Albright MD ELECTRONICALLY SIGNED 09/01/2014 14:36

## 2015-02-26 NOTE — Consult Note (Signed)
PSYCHIATRIC CONSULTATION 49 yo swm who has been treated by this Probation officer since '02  ----CHIEF COMPLAINT/REASON FOR EVALUATION----  "The voices are bad...Marland KitchenMarland KitchenI hear them through the tv....they say I can't have coffee after 5 and I can't smoke outside after 1 am......they can see me in the house when I move around...the refrigerator and ac are being controlled..."  OF PRESENTING PROBLEM----  man w/ a hx of schizoaffective disorder since the early '90s, with several psychiatric hospitalizations at [facility] initially, had remained fairly stable since w/ a few psychotic episodes which were brief and manageble in the community. However he has been more psychotic since the the most recent decompensatione beginning 8/13 Since then, he had been managed by initially increasing olanzapine (leading to severe akasthesia and then a bout of abusing benztropine and a resultant delirium), failed trials of quetiapine, aripiprazole, chlorpromazine. He improved modestly with ziprasidone and remained on this into '15, but stopped this on his own 6/15, blaming the drug for his lack of energy and motivation.  He subsequently relapsed on alcohol, leading to several hospiitalizations this past summer for pancreatitis. He stopped drinking in mid August, and aside from ah and paranoid delusions, he has had frequent diarrhea and has lost a moderte amt of wt, w drecreased appetite. Depression has gotten worse but anxiety is manageble. He did admit to some cocaine abuse in August. On 07/28/14 I saw him in my office he agreed to restart ziprasidone 160 mg bid, but later that evening he called me to report significant anxiety and ah, and wanted to go to the hospital.   MEDICATIONS: Currently on olanzapine 10 qpm (reduced from 15 mg qhs on 07/28/14, he has a strong belief that this medicine helps, yet it appears to have stopped working 2 years ago) sertraline 100 mg qam, alprazolam 0.25 6x/d (10,12,4,6,8,10). Was on narcotics most of summer but  none since last hosp in Aug. Remains on simvastatin, prilosec, lisinopril 2.5 mg qd.    MEDICAL PROBLEMS/ROS: PCP M. Ouida Sills, MD. Diarrhea twice a day. Wt loss. No falls. Last saw Dr. Edd Fabian pa a mo ago and was told diarrhea was due to pancreas. No coughing.  No chest tightness and palpatations. Gagging episodes in am daily. No pain. Worried he had a stroke as one day (only) he woke up and the left side of his left hand was numb. Remainder of 10 element ROS neg.  SUBSTANCES REPORTING SYSTEM: last alprazolam rx from this writer 07/15/14, last narcotics from Dr. Jacqualine Code (ER?) in mid Aug AH are major stress. Grief about uncle's death many mos ago has improved.  Car problems minimal. Parents are doing well. Rarely going out now. Seeing therapist once a week.  3/10+,range 3-4tst= 7-8, no napping but in bed most of the daypoor, eats once a daynone, but feels like it "all the time...." no si, no pdw, + hopelessness, cites "cause I have a lot to offer.Marland KitchenMarland KitchenMarland KitchenI want the voices to stop and have my privacy back....but I can't do anything right now...."  no guns, no hipanics since "I can't remember...." mod, when having ah  minimal, no loss of temper (no hx of violence)ah have worsened in the last several weeks, "all the time..." including in session, intensity increased, moderately threatening of harm and death, + olifactory hallucinations of dead things, feces, beer+ paranoid delusions as above, fear of harm3/10+, keeping up w/ hygienepresent, does enjoy tv but not doing, facebook, music, reading   ABUSE-- no etoh since mid Aug, had been drinking daily and  heavily over the summer. admitted to some cocaine "months ago..." but probably in mid to late Aug, smoking 5-10 cigs/d, but vapes continually, excessive caffeine 3 cups in am, but this is signfiicantly less than previously none EFFECTS: minimal side effects, but w/ mild rigidity, mild dry mouth remains. aims = 1 w/ very mild foot movements MEDICATION EFFECTIVENESS: "No,  they arent helping...."   ----PAST HISTORY----PSYCHIATRIC HISTORY: HOSPITALIZATIONS x1 early '90s at [facility], when he went off olanzepine, had ah, and delusions that he'd been a victim of clandestine surgery w/ an implantation  in his brain; SUICIDE ATTEMPTS none; PSYCHOTROPIC MEDICATIONS clonazepam, stelazine (dystonia), risperidone (dystonia), aripiprazole >> akisthisia, cpz >>tachycardia, quetiapine (ineffective), ziprasidone; SUBSTANCE ABUSE began drinking age 71, binged a lot in his 20's, had had periods of moderate  to excessive intake in the last 25 yrs, 1 dwi (9/01), hx of blackouts, no rehabs, longest sobriety x 5 yrs prior to 1st hospitalization, but restarted in '96 yrs ago. binging on   and thc episodically, both of which started in '12, thinks he was slipped acid once. has abused rx meds (alprazolam) in the past, but none in the last several years due to signficant external monitoring on this writer's part, also abused benztropine 2 yrs ago leading to a delirium ; OUTPATIENT  TREATMENT Dr. Dola Argyle in the '90s,  saw Dr. Stephanie Acre (psychologist) just prior to psychiatric treatment, has been in tx w/ this writer for 13 yrs; PAST DEPRESSIVE/PSYCHOTIC EPISODES he has had 5 clear cut psychotic episodes in '94, '98 and '09, '11, and has remained psychotic since his last decompensation 8/13. Chronic elements of depression but sertraline has been used mostly for panic symptoms, no clear manias or hypomanias seen by this Probation officer,  MEDICAL HISTORY: ILLNESSES/SURGERIES chronic recurrent pancreatitis, copd, hernioraphy ''83, fx 'd hand punched a wall while drunk, hospitalized several yrs ago for PE and chf; HEAD TRAUMAS none; SEIZURES none; ALLERGIES none DEVELOPMENTAL AND SOCIAL HISTORY: BIRTH ORDER oldest of 2, bro; FAMILY HISTORY raised by parents, mat fa w/ etoh, great pgf comitted suicide, no other psych illnesses in family; TRAUMA denied. ; CHILDHOOD good; ADOLESCENCE good, no behavior problems; SCHOOL grad hs, avg  grades, avg popularity. graduated from [employer] in '10 w/ BS in business adm; WORK worked after hs for 30 months, sev jobs. 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 has been on disability since before '02.  MARRIAGE longest relationship x 3.5 yrs, last relationship in the early 90s before he got sick; LEGAL dwi '00 denies other arrests   ----PSYCHIATRIC EXAMINATION----  SIGNS: (from 07/28/14) BP 136/82 sitting; PULSE 70; WEIGHT 195.4, down from 228.8 (6/15) ; HEIGHT 5'8"  .GENERAL APPEARANCE AND MANNER wnwd wm in mild distress, fearful look, casual appearance gait and station normal, no spasticity but mild rigidity, no abnormal movements STATUS: SPEECH spontaneous, coherent, w/ decreased rate, clear, decreased volume and pressure, no blockng. MOOD/AFFECT: depressed, constricted affect, mildly anxious. THOUGHT PROCESS associations appropriate, no perseveration or inability to think abstractly. ASSOCIATIONS without tangentialness, circumstantiality, looseness or flight of ideas; PERCEPTIONS w/ hallucinations as noted above. THOUGHT CONTENT  pi and pd are worse, w/ little reality contact, believes ah are real, w/delusions of thought insertion/mindreading, + hopeless, no suicidal, homicidal or violent ideas or preoccupations. JUDGEMENT intact; INSIGHT poor, still beiieves ah are coming from real others from his neighborhood and also minimizes  risks of chemical dependence; ORIENTATION x 4. MEMORY grossly intact; ATTENTION/CONCENTRATION grossly intact. LANGUAGE appropriate use of words, no dysarthria, normal prosody. FUND OF KNOWLEDGE average.  DECISION MAKING---- INFO REVIEWED: csrs, old records, spoke w/ pharmacist about meds, reviewed [facility] record, interviewed mo    RISK ASSESSMENT: (0 LOW, + HIGH) ATTEMPT 0; IMPULSIVE/AGGRESSION 0; IDEATION/INTENT/PLAN 0; DX +; PANIC +; ANXIETY/TURMOIL  +; MOOD INSTABILITY 0; ANHEDONIA 0; HOPELESSNESS +; INSOMNIA 0; ENERGY +; TX ALLIANCE 0; VOCATIONAL LOSS +; PHYSICAL/PAIN +; PRIOR ATTEMPTS 0; SUBSTANCE ABUSE 0; COGNITIVE COMPETENCE 0; FAMILY HX +; CHILDHOOD ABUSE 0; MARITAL +; FAMILY/CHILDREN 0; FIREARMS 0; STRESSORS 0; AGE 60; GENDER +; VERACITY 0; LONG TERM RISK low mod; SHORT TERM RISK low RISK POTECTIVE FACTORS: (0=ABSENT, + PRESENT)REASONS FOR LIVING +; RESPONSIBLE TO FAMILY AND OTHERS +; CHILDREN 0; FEAR OF SUICIDE/DYING +; FEAR OF PAIN OR SUFFERING +; FEAR OF SOCIAL DISAPPROVAL 0; MORAL OBJECTION TO SUICIDE +; HIGH SPIRITUALITY 0; ENGAGED IN WORK OR SCHOOL 0; PETS 0; HOPEFUL ABOUT FUTURE +; SURVIVAL AND COPING SKILLS +; MARRIED 0; TX ALLIANCE + TERM RISK mod; SHORT TERM RISK low  295.70 Schizoaffective disorder depressive typeAlcohol use disorder, moderate, early full remissionSocial phobia   Cannabis use disorder, mild, sustained full remission Cocaine use disorder, moderate, early full remission Unspecified personality disorder, S/P pancreatitis and ARDS, DVT, S/p PE; HLD, HTN, OA of knees intelligent, good family support limitied insight, social isolation, dependent on medications PLAN/RATIONALE FOR TREATMENT PLAN: Hallucinations and delusions have worsened signficantly in the last several weeks or months, possibly associated w/ cocaine and etoh abuse.  I restarted him on ziprasidone 160 mg bid on 07/28/14 thinking that this medicine which had been moderately beneficial in the last year could help him to the point that clozapine or another antipsychotic could be added and/or cross tapered. I have been trying to get him to consider clozapine in light of multiple antipsychotic failures over the last 2 years and now that he has requested hospitalization, this may be a good time to do this. I also recommend that he be taken off of olanzapine during this admission, and think he should remain on alprazolam .25 mg q3h while awake (fixed dose), and  sertraline -- both used to treat panic and social phobia -- which he has been on for several years. ASSESSMENT: Patient's pharmacotherapy has been reviewed in terms of the benefit of medication vs. the risk of falling. TO MAKE MEDICAL DECISIONS: appears based on the above history and exam to be competent to make medical decisions LEVEL: mod                           Philip H. Octavia Heir, M.D.     (Friday, Jul 30, 2014 11:14 AM)  Electronic Signatures: Sharee Holster (MD)  (Signed on 25-Sep-15 11:16)  Authored  Last Updated: 25-Sep-15 11:16 by Sharee Holster (MD)

## 2015-02-26 NOTE — H&P (Signed)
PATIENT NAME:  Shaun Odonnell, Shaun Odonnell MR#:  [id number] DATE OF BIRTH:  1966/09/15  DATE OF ADMISSION:  06/12/2014  PRIMARY CARE PHYSICIAN: Ocie Cornfield. Ouida Sills, MD  REFERRING PHYSICIAN: Baird Cancer. Jacqualine Code, MD  CHIEF COMPLAINT: Abdominal pain.  HISTORY OF PRESENT ILLNESS: Mr. Gehrig is a 49 year old male with a known history of alcohol use who was diagnosed with alcohol-induced pancreatitis on 06/05/2014. Patient was provided care during the hospital stay with IV fluids, pain management, and was discharged on 06/09/2014 in stable condition when patient was tolerating the diet. Patient was discharged home with pain medications. Patient again comes back on 06/10/2014 . The patient left from the hospital with p.o. pain medications. He comes again today stating that patient is unable to keep down any of his meal, continued to have nausea, increased abdominal pain. Lipase was noted to have trended down compared to the previous admission. Patient states had 2 episodes of vomiting, no episodes of any diarrhea or fever.  PAST MEDICAL HISTORY: 1.  Schizoaffective disorder. 2.  Depression. 3.  Hypertension. 4.  Hyperlipidemia. 5.  Alcohol-induced pancreatitis.  ALLERGIES: 1.  KLONOPIN. 2.  STELAZINE.   HOME MEDICATIONS: 1.  Lisinopril 2.5 mg daily. 2.  Zoloft 100 mg daily. 3.  Simvastatin 40 mg daily. 4.  Zyprexa 50 mg daily. 5.  Alprazolam 0.5 mg every 3 hours as needed. 6.  Prilosec 20 mg b.i.d. 7.  Folic acid 1 mg p.o. daily.  SOCIAL HISTORY: Continues to smoke 3-4 cigarettes a day. Drinks alcohol on a regular basis. No recent alcohol use.  FAMILY HISTORY: Positive for coronary artery disease.  REVIEW OF SYSTEMS: CONSTITUTIONAL: Experiencing generalized weakness. EYES: No change in vision. EARS, NOSE, AND THROAT: No change in hearing. RESPIRATORY: No cough, shortness of breath. CARDIOVASCULAR: No chest pain, palpitations. GASTROINTESTINAL: Has abdominal pain, has been vomiting. No episodes were  witnessed since he came to the Emergency Department. GENITOURINARY: No dysuria, hematuria. HEMATOLOGIC: Denies easy bruising or bleeding. SKIN: No rash or lesions. MUSCULOSKELETAL: No joint pain. NEUROLOGIC: Patient does not have any weakness or numbness in any part of the body.  PHYSICAL EXAMINATION: GENERAL: This is a well-built, well-nourished, age-appropriate male lying down in the bed not in distress. VITAL SIGNS: Temperature , pulse 65, blood pressure 124/78, respiratory rate of 18, oxygen saturation 95% on room air. HEENT: Head normocephalic, atraumatic. There is no scleral icterus. Conjunctivae normal. Pupils equal and reactive. Extraocular movements are intact. Mucous membranes moist. No pharyngeal erythema. NECK: Supple. No lymphadenopathy. No JVD. No carotid bruit. CHEST: There is no focal tenderness. LUNGS: Bilaterally clear to auscultation. HEART: S1, S2 regular. No murmurs are heard. ABDOMEN: Bowel sounds present. Soft, nontender, nondistended. No hepatosplenomegaly. EXTREMITIES: No pedal edema. Pulses 2+, normal. NEUROLOGIC: Patient is alert, oriented to place, person, and time. Cranial nerves II-XII are intact. Motor 5/5 in upper and lower extremities.  LABORATORY DATA: UA negative for nitrites and leukocyte esterase. Lipase 694, trended down from 1100. CMP is completely within normal limits. CBC: WBC of 11.5, rest of the values are within normal limits.   ASSESSMENT AND PLAN: Mr. Wilk is a 49 year old male who comes with acute pancreatitis which was recently treated. 1.  Acute pancreatitis, alcohol induced. Continue with intravenous fluids. Keep the patient on a clear liquid diet. Keep the patient on p.o. pain medications. Emphasized with the patient. Patient expressed understanding. Will continue to follow up. 2.  Hypertension, currently well controlled. Continue the current medications. 3.  Hypokalemia. Will replace by mouth. 4.  Keep  the patient on deep venous  thrombosis prophylaxis with Lovenox.  TIME SPENT: 50 minutes.   ____________________________ Monica Becton, MD pv:sk D: 06/13/2014 00:47:17 ET T: 06/13/2014 01:23:35 ET JOB#: [id number]  cc: Monica Becton, MD, <Dictator> Ocie Cornfield. Ouida Sills, MD Grier Mitts Amerika Nourse MD ELECTRONICALLY SIGNED 06/13/2014 21:00

## 2015-02-26 NOTE — Discharge Summary (Signed)
PATIENT NAME:  Shaun Odonnell, Shaun Odonnell MR#:  [id number] DATE OF BIRTH:  05/16/1966  DATE OF ADMISSION:  06/05/2014 DATE OF DISCHARGE:  06/09/2014  DISCHARGE DIAGNOSES:  1.  Alcoholic pancreatitis.  2.  Schizoaffective disorder.  3.  Hypokalemia.  4.  Alcoholism.  5.  History of opioid abuse.  6.  History of polysubstance abuse.   DISCHARGE MEDICATIONS: Please see his [place] med reconciliation for details, but basically he will be on his home medication regimen including his psychiatric medications, lisinopril and simvastatin.   HISTORY AND PHYSICAL: Please see detailed history and physical done on admission.   HOSPITAL COURSE: The patient was admitted with recurrent pain after being relatively pain free, tolerating a diet on discharge. Went home and soon after had recurrent pain and inability to tolerate orals well. CT and MRI showed mild to moderate inflammation without pseudocysts, masses, etc. He slowly improved. His IV pain medication was discontinued with some difficulty. He was switched to oral tramadol. At that point he rapidly increased his inability to tolerate p.o. and was able to be discharged home soon after. Again, I had long discussions with the patient about the need to follow up closely with Alcoholics Anonymous. He is still somewhat skeptical, but did listen. He has an appointment with a therapist to help with his alcohol and drug addiction issues.   The patient's liver function tests had markedly improved by August 4th with liver enzymes being normal. Albumin, however, is low at 2.7. Alkaline phosphatase stayed minimally elevated at 138 but bilirubin was normal at that point. His white blood cell count had been down to normal, mildly anemic secondary to his acute illness as noted, with normal indices.   He will be discharged home with early followup, psychiatric and  substance abuse issues, as well. Notably, he had some diarrhea on admission. His stool cultures and C. difficile were  normal.  TIME SPENT: It took approximately 35 minutes to do all discharge tasks today.    ____________________________ Ocie Cornfield. Ouida Sills, MD mwa:lt D: 06/09/2014 07:54:18 ET T: 06/09/2014 08:46:57 ET JOB#: [id number]  cc: Ocie Cornfield. Ouida Sills, MD, <Dictator> Kirk Ruths MD ELECTRONICALLY SIGNED 06/10/2014 11:46

## 2015-02-26 NOTE — Consult Note (Signed)
Brief Consult Note: Diagnosis: Schizoaffective disorder, Alcohol use disorder.   Patient was seen by consultant.   Recommend further assessment or treatment.   Orders entered.   Comments: I have known this man since '02, his schizoaff dis goes back to early '90s. In last 2 yrs he has deteriorated w/ ongoing auditory hallucinations sev x's a day, partial panics occasionialy, angergia and absent motivation. He has a hx of substance abuse in past, including alprazolam, cocaine, cannabis, and predominantly etoh, but also abused benztropine at one point a year or so ago, causing a delirium. However, he denies he has substance abuse illness and is planning to quit drinking at this time. He wants to follow up w/ J.Jeremiah a counselor whom he began seeing recently. He wants to continue on sertraline 100 mg daily, and olanzapine 15 mg daily, and I agree. He is willing to follow up w/ this writer at next scheduled appt in Oct. Will change alprazolam to 0.25 mg q3h while awake, given his tendency to have interdose w/drawal.  Electronic Signatures: Sharee Holster (MD)  (Signed 29-Jul-15 10:24)  Authored: Brief Consult Note   Last Updated: 29-Jul-15 10:24 by Sharee Holster (MD)

## 2015-02-26 NOTE — Consult Note (Signed)
Brief Consult Note: Diagnosis: Patient with discharge 2 days ago for pancreatitis. Now with worsening pain. Also says he had one day of diarrhea but none since 2 am last night. Now says he is constipated.   Patient was seen by consultant.   Comments: Pancreatitis. Patient discharged with laipse of 500 and came back at 1000.  Continue conservative therapy. Hydration with pain medications and NPO.  Electronic Signatures: [name] (MD)  (Signed 01-Aug-15 10:39)  Authored: Brief Consult Note   Last Updated: 01-Aug-15 10:39 by [name] (MD)

## 2015-02-26 NOTE — Consult Note (Signed)
Brief Consult Note: Diagnosis: schizophrenia.   Patient was seen by consultant.   Consult note dictated.   Recommend further assessment or treatment.   Orders entered.   Discussed with Attending MD.   Comments: PSychiatry: PAtient seen, chart reviewed, note dictated. Hearing voices. Scared. Will restart meds. Unit downstairs currently agitated. Delay admission until he gets his meds. May be able to go home. Will follow.  Electronic Signatures: Malaysha Arlen, Madie Reno (MD)  (Signed 24-Sep-15 11:10)  Authored: Brief Consult Note   Last Updated: 24-Sep-15 11:10 by Gonzella Lex (MD)

## 2015-02-26 NOTE — Consult Note (Signed)
PATIENT NAME:  Shaun Odonnell, Shaun Odonnell MR#:  [id number] DATE OF BIRTH:  23-May-1966  DATE OF CONSULTATION:  08/30/2014  REFERRING PHYSICIAN:   CONSULTING PHYSICIAN:  Gonzella Lex, MD  IDENTIFYING INFORMATION AND REASON FOR CONSULTATION:  A 49 year old man brought in voluntarily stating that he is having psychotic symptoms. Requesting hospitalization.   HISTORY OF PRESENT ILLNESS: Information obtained from the patient and the chart. The patient comes to the Emergency Room stating that he is having auditory hallucinations. They make him frightened. At times they talk about suicide or say ugly things to him. Also he has been having chest pain recently. He saw Dr. Octavia Heir today who was concerned about his mental health and physical state and urged him to come to the emergency room. The patient has been gradually increasing his dose of clozapine with Dr. Octavia Heir, but has taken himself completely off of his Geodon. He claims that he has not been drinking, although he back pedals on that a little bit to say that it has been only a couple of beers here and there. Denies that he is abusing other drugs. Does not report any other particular new stress that has come up.   PAST PSYCHIATRIC HISTORY: Long history of schizoaffective disorder. By Dr. Starr Sinclair note he had been doing pretty well recently, but has had some decompensation in the last several months. He is being switched from Geodon over to clozapine. Also has a history of abuse of alcohol in the past. Has had hospitalizations including a fairly recent one. The patient has no history of suicide attempts or violent behavior.   FAMILY HISTORY: Paternal great grandfather committed suicide. No other family history.   PAST MEDICAL HISTORY: Chronic recurrent pancreatitis probably related to alcohol abuse. COPD.   SOCIAL HISTORY: The patient went to [employer]) << MISSING TEXT>> but has not really worked for a long time. Not married. No children. Fairly limited  social interactions.   CURRENT MEDICATIONS: He is on clozapine, is now 200 mg twice a day, sertraline 100 mg in the morning, alprazolam 0.5 mg 6 times a day, simvastatin, Prilosec.   ALLERGIES: CLONAZEPAM, MORPHINE, ORAPRED.   REVIEW OF SYSTEMS: The patient endorses auditory hallucinations. Vague suicidal thoughts. No specific plan. No visual hallucinations. Sleep is intermittent. Mood is down and sad. He has been having some chest pain recently, otherwise, no new medical problems.   MENTAL STATUS EXAMINATION: Neatly dressed and groomed man looks his stated age. Cooperative with the interview. Eye contact is good. Psychomotor activity normal. Speech normal rate, tone and volume. Affect is euthymic, reactive, appropriate. Mood stated as nervous. Thoughts are lucid. No loosening of associations or delusions. Denies suicidal intent, but has vague suicidal thoughts. No homicidal thoughts. Has auditory hallucinations regularly. He is alert and oriented x4. Judgment and insight reasonably good. Memory intact; 3 out of 3 objects immediately, 2 out of 3 at 3 minutes.   RADIOGRAPHS:  Chest x-ray unremarkable.  LABORATORY RESULTS:   Lipase normal. Troponins normal.  CBC - elevated white count 10.7, low platelet count 141,000.  Chemistry panel: Glucose elevated at 125.   ASSESSMENT: This is a 49 year old man with a history of schizophrenia, back to hearing auditory hallucinations again. Nervous, having chest, pain vaguely suicidal.  Wants inpatient hospital treatment.   TREATMENT PLAN: The patient will be admitted voluntarily to psychiatry.  At Dr. Starr Sinclair request we will increase the dose of the clozapine.  I am going to go up to 250 mg twice a day. The  patient educated about this and is agreeable to the plan.  Precautions in place.   DIAGNOSIS, PRINCIPAL AND PRIMARY:  AXIS I: Schizoaffective disorder.   SECONDARY DIAGNOSES: AXIS I: Alcohol abuse.   AXIS II:  Deferred.   ____________________________ Gonzella Lex, MD jtc:jw D: 08/30/2014 18:03:20 ET T: 08/30/2014 18:50:36 ET JOB#: [id number]  cc: Gonzella Lex, MD, <Dictator> Gonzella Lex MD ELECTRONICALLY SIGNED 09/11/2014 14:53

## 2015-02-26 NOTE — Consult Note (Signed)
Chief Complaint:  Subjective/Chief Complaint Pt states mid-abdominal pain 5/10.  Denies nausea, no vomiting.  +couple loose stools this AM   VITAL SIGNS/ANCILLARY NOTES: **Vital Signs.:   04-Aug-15 05:00  Vital Signs Type Routine  Temperature Temperature (F) 97.6  Celsius 36.4  Temperature Source oral  Pulse Pulse 57  Respirations Respirations 18  Systolic BP Systolic BP 443  Diastolic BP (mmHg) Diastolic BP (mmHg) 66  Mean BP 84  Pulse Ox % Pulse Ox % 91  Pulse Ox Activity Level  At rest  Oxygen Delivery Room Air/ 21 %    09:59  Celsius 97.5  Pulse Pulse 70  Respirations Respirations 18  Systolic BP Systolic BP 154  Diastolic BP (mmHg) Diastolic BP (mmHg) 73  Mean BP 90  Pulse Ox % Pulse Ox % 96  Pulse Ox Activity Level  At rest  Oxygen Delivery Room Air/ 21 %   Brief Assessment:  GEN well developed, well nourished, no acute distress, A/Ox3   Cardiac Regular   Respiratory normal resp effort  no use of accessory muscles   Gastrointestinal Normal   Gastrointestinal details normal Soft  Bowel sounds normal  +mild distention, mild TTP LUQ   EXTR negative cyanosis/clubbing, negative edema   Lab Results: Hepatic:  04-Aug-15 04:33   Bilirubin, Total 0.3  Alkaline Phosphatase  138 (46-116 NOTE: New Reference Range 05/25/14)  SGPT (ALT) 47 (14-63 NOTE: New Reference Range 05/25/14)  SGOT (AST) 31  Total Protein, Serum 6.5  Albumin, Serum  2.7  Routine Chem:  04-Aug-15 04:33   Glucose, Serum 91  BUN  4  Creatinine (comp) 0.62  Sodium, Serum 140  Chloride, Serum 105  CO2, Serum 26  Calcium (Total), Serum 8.6  Osmolality (calc) 276  eGFR (African American) >60  eGFR (Non-African American) >60 (eGFR values <59m/min/1.73 m2 may be an indication of chronic kidney disease (CKD). Calculated eGFR is useful in patients with stable renal function. The eGFR calculation will not be reliable in acutely ill patients when serum creatinine is changing rapidly. It is  not useful in  patients on dialysis. The eGFR calculation may not be applicable to patients at the low and high extremes of body sizes, pregnant women, and vegetarians.)  Anion Gap 9  Routine Hem:  04-Aug-15 04:33   WBC (CBC) 6.3  RBC (CBC)  3.79  Hemoglobin (CBC)  11.9  Hematocrit (CBC)  36.7  Platelet Count (CBC) 313  MCV 97  MCH 31.3  MCHC 32.4  RDW 14.3  Neutrophil % 74.2  Lymphocyte % 12.1  Monocyte % 7.7  Eosinophil % 0.7  Basophil % 5.3  Neutrophil # 4.7  Lymphocyte #  0.8  Monocyte # 0.5  Eosinophil # 0.0  Basophil #  0.3 (Result(s) reported on 08 Jun 2014 at 06:01AM.)   Assessment/Plan:  Assessment/Plan:  Assessment Uncomplicated ETOH pancreatitis:  Pain improving.  Tolerating clear liquids well.   Plan Continue supportive care  Please call if you have any questions or concerns   Electronic Signatures: JAndria Meuse(NP)  (Signed 04-Aug-15 12:19)  Authored: Chief Complaint, VITAL SIGNS/ANCILLARY NOTES, Brief Assessment, Lab Results, Assessment/Plan   Last Updated: 04-Aug-15 12:19 by JAndria Meuse(NP)

## 2015-02-26 NOTE — Consult Note (Signed)
PSYCHIATRIC CONSULTATION 49 yo swm seen in follow up  ----CHIEF COMPLAINT/REASON FOR EVALUATION----  "I can't' get away from the voices, I'm having a lot of stress and I get chest pains...."  OF PRESENTING PROBLEM----  man w/ a hx of schizoaffective disorder since the early '90s, had remained fairly stable with several brief psychotic episodes through '13. Since then however he has been chronically psychotic with auditory hallucinations, paranoid delusions, and negative symptoms. He was hospitalized in late 915, and started on clozapine. Since the last appt with this writer on 08/11/14  he has had a variable course, doing well some days and not so others. He  notes he never took nac and (misunderstanding?) weaned himself off of ziprasidone quickly. He went to the ER last week, hoping to be admitted but was returned home.  MEDICATIONS: Currently on clozapine 200 mg bid (increased 1 week ago) , sertraline 100 mg qam, alprazolam 0.25 6x/d (8,10,12,4,6,8). Off of lisinopril due to lightheadedness. Remains on simvastatin, prilosec. Denies other new meds.  MEDICAL PROBLEMS/ROS: Diarrhea 3x/wk, no constipation. Went to ER for gi pains 01/23/14 and had labs and sent home. No falls. Chest tightness/pain and palpa recently assoc w/ ah for last week. Gagging episodes only a few x's since last appt. No pain. Still w/ numbness in left hand, 4th and 5th digits. Sialorrhea noted at night.  Remainder of 10 element ROS neg. Due to see Dr. Ouida Sills tomorrow.  SUBSTANCES REPORTING SYSTEM: unremarkable, last alpz rx was 07/15/14 AH are major stress. Grief about uncle's death has improved slightly. Parents are doing well, he has visited w/ them once, but talks w/ them daily.  Rarely going out now. Seeing therapist once a week.  4/10+, range 2-7/10+tst= 9, no insomnia, occasional napping during the day, but in bed most of the day, "unless I'm cooking, eating, checking my email, watching tv..."good, 3 meals a daynone, feels like it  1x/wk no si, no pdw, + hopelessness, cited "my life, family..."  as reasons to keep living, no guns, no hi2x/wk, partials  mild, worries when ah threaten him  minimal, no loss of temper (no hx of violence)ah "all the time....but only a little in the office..." rates intensity 6/10-, variably understandable, ah threaten to kill him. no olifactory hallucinations+ paranoid delusions that ah come from neighborhood3-4/10+, keeping up w/ hygiene, does some cleaning, internet, cookingpresent, does get some enjoyment from tv, music, email, but doesn't do facebook   ABUSE-- last etoh Sat when he had 8 beer,s and had 6 over a week ago, smoking 10 cigs/d, but is vapes continually, mod caffeine 2 cups in am none EFFECTS: mild orthostasis, dry mouth, aims = 0 MEDICATION EFFECTIVENESS: "Yeah, its helping w/ the paranoia and the voices some..but I need something else...."   ----PAST HISTORY----PSYCHIATRIC HISTORY: HOSPITALIZATIONS x1 early '90s at [facility], when he went off olanzepine, had ah, and delusions that he'd been a victim of clandestine surgery w/ an implantation put in his brain, and once again 9/15 at [facility]; SUICIDE ATTEMPTS none; PSYCHOTROPIC MEDICATIONS clonazepam, stelazine (dystonia), risperidone (dystonia), aripiprazole >> akisthisia, ziprasidone; SUBSTANCE ABUSE began drinking age 57, binged a lot in his 20's, had had periods of moderate intake in the last 10 yrs, 1 dwi (9/01), hx of blackouts, no rehabs, longest sobriety x 5 yrs prior to 1st hospitalization, but restarted in '96 yrs ago, c+a+g+e+. tried cocaine, mj both of which started abusing in '12, thinks he was slipped acid once. last drugs mj and cocaine, in late 12. ;  OUTPATIENT  TREATMENT Dr. Dola Argyle in the '90s,  saw Dr. Stephanie Acre (psychologist) just prior, has been in tx w/ this writer for 10+ yrs; PAST DEPRESSIVE/PSYCHOTIC EPISODES he has had 5 clear cut psychotic episodes in '94, '98 and '09, '11, and now this episode ('13) MEDICAL HISTORY:  ILLNESSES/SURGERIES chronic recurrent pancreatitis, copd hernioraphy ''83, fx 'd hand punched a wall while drunk, pancreatitis, hospitalized several yrs ago for PE and chf; HEAD TRAUMAS none; SEIZURES none; ALLERGIES none DEVELOPMENTAL AND SOCIAL HISTORY: BIRTH ORDER oldest of 2, bro; FAMILY HISTORY raised by parents, mat fa w/ etoh, great pgf comitted suicide, no psych illnesses in family; TRAUMA denied. ; CHILDHOOD good; ADOLESCENCE good, no jd; SCHOOL grad hs, avg grades, avg pop. completed [employer] a few yrs ago w/ BS in business adm; WORK worked after hs for 30 months, sev jobs. 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 last worked 23? yrs ago; MARRIAGE longest relationship x 3.5 yrs, last relationship in the early 90s before he got sick; LEGAL dwi '00 denies other arrests   ----PSYCHIATRIC EXAMINATION----  SIGNS: BP 141/87 sitting, 142/90 standing; PULSE 113/117; WEIGHT 210; HEIGHT 5'8"  .GENERAL APPEARANCE AND MANNER wnwd wm in mild distress, fearful look, casual appearance gait and station normal, no spasticity but mild rigidity, no abnormal movements, but w/ mild rhythmic tremor in UE  STATUS: SPEECH spontaneous, coherent, w/ decreased rate, clear, decreased volume and pressure, no blockng. MOOD/AFFECT: denied being depressed, but appears sad, w/ constricted affect, mildly anxious. THOUGHT PROCESS associations appropriate, no perseveration or inability to think abstractly. ASSOCIATIONS without tangentialness, circumstantiality, looseness or flight of ideas; PERCEPTIONS w/ hallucinations as noted above. THOUGHT CONTENT  pi and pd are mod now, still w/ little reality contact, believes ah are real, coming from a satellite or ham radio, and still w/ mild delusions of thought insertion/mindreading, + hopeless, no suicidal, homicidal or violent ideas or preoccupations. JUDGEMENT intact; INSIGHT poor,  also denies that gi pain is caused by etoh, and minimizes risks of chemical dependence; ORIENTATION x 4. MEMORY grossly intact; ATTENTION/CONCENTRATION grossly intact. LANGUAGE appropriate use of words, no dysarthria, normal prosody. FUND OF KNOWLEDGE average.   ----PSYCHOTHERAPY----minutes supportive psychotherapy provided, focused on his recent struggle to cope w/ ah, how going to hospital is the primary way he thinks he should manage these. Also continue to do MI around substance dependence.  Explored the nature and etioligy of active symptoms.     DECISION MAKING---- INFO REVIEWED: csrs, old records, reviewed [facility] record RISK ASSESSMENT: (0 LOW, + HIGH) ATTEMPT 0; IMPULSIVE/AGGRESSION 0; IDEATION/INTENT/PLAN 0; DX +; PANIC +; ANXIETY/TURMOIL +; MOOD INSTABILITY 0; ANHEDONIA 0; HOPELESSNESS +; INSOMNIA 0; ENERGY +; TX ALLIANCE 0; VOCATIONAL LOSS +; PHYSICAL/PAIN +; PRIOR ATTEMPTS 0; SUBSTANCE ABUSE 0; COGNITIVE COMPETENCE 0; FAMILY HX +; CHILDHOOD ABUSE 0; MARITAL +; FAMILY/CHILDREN 0; FIREARMS 0; STRESSORS 0; AGE 49; GENDER +; VERACITY 0 RISK POTECTIVE FACTORS: (0=ABSENT, + PRESENT)REASONS FOR LIVING +; RESPONSIBLE TO FAMILY AND OTHERS +; CHILDREN 0; FEAR OF SUICIDE/DYING +; FEAR OF PAIN OR SUFFERING +; FEAR OF SOCIAL DISAPPROVAL 0; MORAL OBJECTION TO SUICIDE +; HIGH SPIRITUALITY 0; ENGAGED IN WORK OR SCHOOL 0; PETS 0; HOPEFUL ABOUT FUTURE +; SURVIVAL AND COPING SKILLS +; MARRIED 0; TX ALLIANCE + TERM RISK mod; SHORT TERM RISK mod  F25.1 Schizoaffective disorder depressive typeAlcohol use disorder, moderate, early full remissionSocial phobia  Cannabis use disorder, mild, sustained full remission Cocaine use disorder, mild, sustained partial remission Unspecified personality disorderS/P pancreatitis and ARDS, DVT, S/p PE; HLD, HTN, OA of knees intelligent, good family support limitied insight, social isolation, dependent on medications PLAN/RATIONALE FOR TREATMENT PLAN: Hallucinations and delusions  have continued and are worse, most likely due to his dc of ziprasidone. Negative sx are still present. Tachycardia and chest pains are a concern. He wants to be admitted to psychiatry, so will try to do this, but first will have him seen in ER to r/o cardiac etiology for his symptoms. Once that is done will try to have him admitted to psychiatry, w/ a plan to increase clozapine to 300 mg bid, while continuing on sertraline 100 mg qam, alprazolam 0.25 mg 6x/d (8,10,12,4,6,8).  Urged to not drink anymore, reduce nicotine and caffeine, begin walking daily. URged to start taking nac 1000 mg bid. ASSESSMENT: Patient's pharmacotherapy has been reviewed in terms of the benefit of medication vs. the risk of falling. TO MAKE MEDICAL DECISIONS: appears based on the above history and exam to be competent to make medical decisions LEVEL: mod  ----PATIENT EDUCATION---- AND/OR FAMILY AND/OR GUARDIAN IS AWARE OF AND UNDERSTANDS THE NATURE OF THE PROPOSED TREATMENT OR TREATMENT CHANGES: yes AND/OR FAMLY AND/OR GUARDIAN IS AWARE OF AND UNDERSTANDS THE RISKS AND BENEFITS OF THE PROPOSED TREATMENT OR TREATMENT CHANGES: including risk of td worsening, met-s, paradoxical suicidal ideas, sexual side effects, weight gain, cardiac problems, constipation, addiction from alprazolam AND/OR FAMILY AND/OR GUARDIAN IS AWARE OF AND UNDERSTANDS WHAT ALTERNATIVES ARE AVAILABLE TO THE PROPOSED TREATMENT OR TREATMENT CHANGES : yes AND/OR FAMILY AND/OR GUARDIAN UNDERSTANDS THE RISKS AND BENEFITS OF THE ALTERNATIVE TREATMENTS: yes AND/OR FAMILY AND/OR GUARDIAN UNDERSTANDS THE RISKS AND BENEFITS OF DOING NOTHING: yes AND/OR FAMILY AND/OR GUARDIAN UNDERSTANDS THE SIGNS AND SYMPTOMS OF RELAPSE OR WORSENING WHICH WOULD REQUIRE HIM/HER TO RETURN TO THIS OFFICE OR THE EMERGENCY ROOM: yes  INSTRUCTIONS PROVIDED: none TIME:  3 p/4 p APPOINTMENT:  09/06/14, 11a                           Adrian Blackwater. Octavia Heir, M.D.     (Monday, Aug 30, 2014 04:03  PM)        Electronic Signatures: Sharee Holster (MD)  (Signed on 26-Oct-15 17:00)  Authored  Last Updated: 26-Oct-15 17:00 by Sharee Holster (MD)

## 2015-02-26 NOTE — H&P (Signed)
PATIENT NAME:  Shaun Odonnell, SORG MR#:  [id number] DATE OF BIRTH:  [birth year]  DATE OF ADMISSION:  07/30/2014  REFERRING PHYSICIAN: Emergency Room MD   ATTENDING PHYSICIAN: Orson Slick, MD   IDENTIFYING DATA: Shaun Odonnell is a 49 year old male with history of schizoaffective disorder.   CHIEF COMPLAINT: "I have voices. They are louder. I am at my wits' end."  HISTORY OF PRESENT ILLNESS: Shaun Odonnell has been a patient of Dr. Royston Bake for many years now. He has been fairly stable in the past several years, but since 2013, reportedly, he has been continuously psychotic, experiencing hallucinations. Several medication adjustments were offered, but the patient had problems with many medications. He best responded to Zyprexa but then developed side effects, and recently Geodon was added to his regimen. The patient started experiencing loud, derogatory voices that he can barely stand. He compares auditory hallucinations to the time when he was first diagnosed, then treated and then stopped taking his medications when the voices returned with full strength. He cannot tolerate it any longer. He decided to come to the hospital. He was discussed the possibility of starting clozapine with Dr. Octavia Heir for several years now and was not really interested in doing so, but he himself brought up clozapine. He agreed to start it in the hospital. The patient in addition to auditory hallucinations experiences many symptoms of depression that have been troubling him since August, with poor sleep, stays in bed most of the day, decreased appetite, anhedonia, feeling of worthlessness, hopelessness, guilt, heightened anxiety, even though he is on a continuous dose of Xanax, worsening of his hallucinations, feeling paranoid, people are out to get him. He lost his privacy. Poor energy, difficulties keeping up with his hygiene and ADLs. He denies suicidal ideations. He reports good compliance with medications, but no longer  believes that Zyprexa is helping and has doubts if Geodon is going to help his hallucinations, but he does report that since admission, he has been feeling slightly better. He has a history of alcohol and cocaine abuse, but has been sober for several years, even though there was a brief relapse this summer. He also has a history of benzodiazepine abuse as well as Cogentin.   PAST PSYCHIATRIC HISTORY: He was diagnosed with schizoaffective disorder in the 90s. He has several psychiatric hospitalizations. He has been tried on numerous medications, Klonopin, Stelazine, Risperdal, Abilify, Seroquel, Geodon, Zyprexa. He developed side effects; mostly, dystonia, tachycardia, akathisia from most of them. Seroquel was ineffective. He has been a patient of Dr. Octavia Heir. He, in the past, did see other providers. There were no suicide attempts.   FAMILY PSYCHIATRIC HISTORY: Paternal great-grandfather committed suicide. No other history of mental illness in the family.   PAST MEDICAL HISTORY: Chronic, recurrent, pancreatitis, COPD.    ALLERGIES: KLONOPIN, MORPHINE, ORAPRED, STELAZINE.    MEDICATION ON ADMISSION: Lisinopril 2.5 mg daily, olanzapine 10 mg at bedtime, Geodon 80 mg twice daily, Xanax 0.25 mg every 2 hours up to 7 times daily, omeprazole 40 mg twice daily.   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 Went back to school, but did not graduate. 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 He has not been married, has no children. He lives independently. He is disabled.   REVIEW OF SYSTEMS: CONSTITUTIONAL: No fevers or chills. Positive for gradual weight gain on Zyprexa.  EYES: No double or blurred vision.  ENT: No hearing loss. RESPIRATORY: No shortness of breath or cough.  CARDIOVASCULAR:  No chest pain or orthopnea.  GASTROINTESTINAL: No abdominal pain, nausea, vomiting, or diarrhea.  GENITOURINARY: No incontinence or frequency.  ENDOCRINE: No  heat or cold intolerance.  LYMPHATIC: No anemia or easy bruising.  INTEGUMENTARY: No acne or rash.  MUSCULOSKELETAL: No muscle or joint pain.  NEUROLOGIC: No tingling or weakness.  PSYCHIATRIC: See history of present illness for details.   PHYSICAL EXAMINATION: VITAL SIGNS: Blood pressure 129/84, pulse 58, respirations 20, temperature 98.5.  GENERAL: This is an obese, middle-aged male in no acute distress.  HEENT: The pupils are equal, round, and reactive to light. Sclerae anicteric.  NECK: Supple. No thyromegaly.  LUNGS: Clear to auscultation. No dullness to percussion.  HEART: Regular rhythm and rate. No murmurs, rubs, or gallops.  ABDOMEN: Soft, nontender, nondistended. Positive bowel sounds.  MUSCULOSKELETAL: Normal muscle strength in all extremities.  SKIN: No rashes or bruises.  LYMPHATIC: No cervical adenopathy.  NEUROLOGIC: Cranial nerves II through XII are intact.   LABORATORY DATA: Chemistries are within normal limits except for blood glucose of 132. Blood alcohol level is zero. LFTs within normal limits. Urine tox screen positive for benzodiazepines. CBC within normal limits. ANC 4.3. Urinalysis is negative urinary tract infection.   MENTAL STATUS EXAMINATION ON ADMISSION: The patient is alert and oriented to person, place, time and situation. He is pleasant, polite and cooperative. He is well groomed and casually dressed. He maintains good eye contact. His speech is of normal rhythm, rate and volume, rather soft. Mood is depressed with flat affect. Thought process is logical and goal oriented, but influenced by auditory command derogatory hallucinations. He denies suicidal or homicidal ideation. His cognition is grossly intact. Registration, recall, and long-term memory are intact. He is of normal intelligence and fund of knowledge. His insight and judgment are fair.   SUICIDE RISK ASSESSMENT ON ADMISSION: This is a patient with a long history of schizoaffective disorder, admitted  for worsening of depression and psychosis.   INITIAL DIAGNOSES:  AXIS I: Schizoaffective disorder, depressive type, alcohol use disorder, cannabis use disorder, cocaine use disorder, all in remission.   AXIS II: Deferred.   AXIS III: Chronic obstructive pulmonary disease, history of pancreatitis.   PLAN: The patient was admitted to [place] [facility] for safety, stabilization and medication management. He was initially placed on suicide precautions and was closely monitored for any unsafe behaviors. He underwent full psychiatric and risk assessment. He received pharmacotherapy, individual and group psychotherapy, substance abuse counseling, and support from therapeutic milieu.  1. Psychosis. Dr. Octavia Heir provided [place] with excellent  detailed consultation on September 25th. See this for details. He suggested that Zyprexa be discontinued and the patient kept on Clozaril. The patient agrees. We will start clozapine 50 mg tonight.  2. Anxiety. We will continue Xanax as prescribed in the community.  3. Depression. Will continue Zoloft.  4. Medical. We will continue lisinopril for hypertension and pantoprazole for GERD.  5. Disposition. He will likely be discharged to home. He will follow up with Dr. Octavia Heir.   ____________________________ Wardell Honour Bary Leriche, MD jbp:lm D: 07/31/2014 18:28:56 ET T: 07/31/2014 21:25:37 ET JOB#: [id number]  cc: Javaya [place] B. Bary Leriche, MD, <Dictator> Clovis Fredrickson MD ELECTRONICALLY SIGNED 08/02/2014 23:31

## 2015-03-06 NOTE — Consult Note (Signed)
PATIENT NAME:  Shaun Odonnell, Shaun Odonnell MR#:  [id number] DATE OF BIRTH:  [birth year]  DATE OF CONSULTATION:  11/26/2014  REFERRING PHYSICIAN:   CONSULTING PHYSICIAN:  Itha Kroeker K. Franchot Mimes, MD  PLACE OF DICTATION:  [facility] [place], [place].    AGE:  49 years.    SEX:  Male.    RACE:  White.    SUBJECTIVE:  The patient was seen in consultation at [facility] .  The patient is a 49 year old white male who is not married, single, and is trying to take SAT to go to law school.  The patient lives by himself.  The patient reports last night that he was drinking alcohol and he had felt that he had some chest pain and he came to the Emergency Room and they sent him to behavioral health.  The patient has a long history of mental illness and has diagnosis of schizoaffective disorder and is being followed by Dr. Octavia Heir in the community at his clinic, last appointment was in December of 2015, next appointment is coming up in February 2016 and the patient plans to keep up his followup appointment.   ALCOHOL AND DRUGS:  Admits that he does drink alcohol but not on a daily basis.  Last night he had been drinking. He had 1 DWI many years ago and it was dismissed because he took all the recommended classes.    MENTAL STATUS:  The patient is dressed in his hospital scrubs.  Alert and oriented, calm, pleasant, and cooperative.   Affect is neutral and  mood is  stable.  Denies being depressed.  Denies feeling h/h or w/u Does not appear to be responding to internal stimuli.  Memory is intact.  Cognition is intact. Denies suicidal or homicidal plans and contracts for safety.  Insight and judgment are fair and adequate. Good impulse control.    IMPRESSION:  SAD  bipolar, depressed, currently stable  Anxiety disorder unspecified.    RECOMMEND:  Discontinue involuntary commitment.  Discharge the patient home.  The patient does have a supply of medications at home and he will keep up his followup  appointment with Dr. Octavia Heir and will call for an earlier appointment.     ____________________________ Wallace Cullens. Franchot Mimes, MD skc:bu D: 11/26/2014 17:01:18 ET T: 11/26/2014 21:13:27 ET JOB#: [id number]  cc: Arlyn Leak K. Franchot Mimes, MD, <Dictator> Dewain Penning MD ELECTRONICALLY SIGNED 11/27/2014 16:33

## 2015-06-02 ENCOUNTER — Emergency Department: Payer: Medicare Other

## 2015-06-02 ENCOUNTER — Emergency Department
Admission: EM | Admit: 2015-06-02 | Discharge: 2015-06-02 | Disposition: A | Discharge status: Home / Self Care | Payer: Medicare Other | Attending: Emergency Medicine | Admitting: Emergency Medicine

## 2015-06-02 DIAGNOSIS — Z79899 Other long term (current) drug therapy: Secondary | ICD-10-CM | POA: Diagnosis not present

## 2015-06-02 DIAGNOSIS — R0789 Other chest pain: Secondary | ICD-10-CM | POA: Insufficient documentation

## 2015-06-02 DIAGNOSIS — R42 Dizziness and giddiness: Secondary | ICD-10-CM

## 2015-06-02 DIAGNOSIS — Z720 Tobacco use: Secondary | ICD-10-CM | POA: Insufficient documentation

## 2015-06-02 DIAGNOSIS — F410 Panic disorder [episodic paroxysmal anxiety] without agoraphobia: Secondary | ICD-10-CM | POA: Diagnosis not present

## 2015-06-02 HISTORY — DX: Fatty (change of) liver, not elsewhere classified: K76.0

## 2015-06-02 HISTORY — DX: Prediabetes: R73.03

## 2015-06-02 HISTORY — DX: Schizoaffective disorder, unspecified: F25.9

## 2015-06-02 LAB — TROPONIN I: Troponin I: <0.03 ng/mL (ref <0.031)

## 2015-06-02 LAB — BASIC METABOLIC PANEL
ANION GAP: 16 — AB (ref 5–15)
BUN: 8 mg/dL (ref 6–20)
CALCIUM: 9.2 mg/dL (ref 8.9–10.3)
CO2: 21 mmol/L — AB (ref 22–32)
Chloride: 100 mmol/L — ABNORMAL LOW (ref 101–111)
Creatinine, Ser: 0.83 mg/dL (ref 0.61–1.24)
GFR calc Af Amer: >60 mL/min (ref >60)
GLUCOSE: 108 mg/dL — AB (ref 65–99)
Potassium: 4 mmol/L (ref 3.5–5.1)
Sodium: 137 mmol/L (ref 135–145)

## 2015-06-02 LAB — URINALYSIS COMPLETE WITH MICROSCOPIC (ARMC ONLY)
Bacteria, UA: NONE SEEN
Bilirubin Urine: NEGATIVE
Glucose, UA: NEGATIVE mg/dL
HGB URINE DIPSTICK: NEGATIVE
Ketones, ur: NEGATIVE mg/dL
LEUKOCYTES UA: NEGATIVE
Nitrite: NEGATIVE
PH: 7 (ref 5.0–8.0)
Protein, ur: NEGATIVE mg/dL
RBC / HPF: NONE SEEN RBC/hpf (ref 0–5)
Specific Gravity, Urine: 1.008 (ref 1.005–1.030)

## 2015-06-02 LAB — CBC
HCT: 46.9 % (ref 40.0–52.0)
HEMOGLOBIN: 15.5 g/dL (ref 13.0–18.0)
MCH: 31.4 pg (ref 26.0–34.0)
MCHC: 33.1 g/dL (ref 32.0–36.0)
MCV: 94.8 fL (ref 80.0–100.0)
PLATELETS: 149 10*3/uL — AB (ref 150–440)
RBC: 4.95 MIL/uL (ref 4.40–5.90)
RDW: 14.3 % (ref 11.5–14.5)
WBC: 7.6 10*3/uL (ref 3.8–10.6)

## 2015-06-02 MED ORDER — SODIUM CHLORIDE 0.9 % IV BOLUS (SEPSIS): 1000.0000 mL | Freq: Once | INTRAVENOUS | Status: DC

## 2015-06-02 NOTE — ED Notes (Signed)
Pt c/o generalized CP, dizziness, weakness," heart palpitations" X 1.5 weeks. Pt sent over from [facility]. Pt states that he is being taken off of alprazolam; states he is weaning down dose. Pt thinks this is related. Pt alert and oriented X4, active, cooperative, pt in NAD. RR even and unlabored, color WNL.

## 2015-06-02 NOTE — ED Provider Notes (Signed)
[facility] Emergency Department Provider Note   ____________________________________________  Time seen: 2:30 PM I have reviewed the triage vital signs and the triage nursing note.  HISTORY  Chief Complaint Dizziness and Chest Pain   Historian Patient  HPI Shaun Odonnell is a 49 y.o. male who has a history of schizoaffective disorder and sees a psychiatristfor panic attacks and anxiety per the patient. Patient states he went to the [place] clinic this morning because he was having lightheadedness and central chest pressure which she described as anxiety. He states he's been having symptoms like this for a month or so since his psychiatrist has been weaning him off of alprazolam and also changing his antidepressant from Zyprexa and Zoloft 2 Clozaril and Lexapro. He states that he is not having depressive symptoms or suicidal thoughts, however his anxiety and panic attacks have much increased. He does have an appointment with his primary care physician Dr. Ouida Sills this afternoon at 3:30, but states he didn't think he could wait until then and so he went to the walk-in clinic.     Past Medical History  Diagnosis Date  . Schizoaffective disorder   . Fatty liver   . Borderline diabetes     There are no active problems to display for this patient.   History reviewed. No pertinent past surgical history.  Current Outpatient Rx  Name  Route  Sig  Dispense  Refill  . ALPRAZolam (XANAX) 0.25 MG tablet   Oral   Take 0.125 mg by mouth 3 (three) times daily.         . clozapine (CLOZARIL) 200 MG tablet   Oral   Take 250 mg by mouth 2 (two) times daily.         Marland Kitchen escitalopram (LEXAPRO) 10 MG tablet   Oral   Take 30 mg by mouth daily.           Allergies Klonopin; Morphine and related; and Stelazine  No family history on file.  Social History History  Substance Use Topics  . Smoking status: Current Every Day Smoker  . Smokeless tobacco: Not  on file  . Alcohol Use: Yes   prior alcohol abuse, but states he's decreased significantly.  Review of Systems  Constitutional: Negative for fever. Eyes: Negative for visual changes. ENT: Negative for sore throat. Cardiovascular: Occasional chest pressure and association with a "panic attack" Respiratory: Negative for shortness of breath. Gastrointestinal: Negative for abdominal pain, vomiting and diarrhea. Genitourinary: Negative for dysuria. Musculoskeletal: Negative for back pain. Skin: Negative for rash. Neurological: Negative for headaches, focal weakness or numbness. 10 point Review of Systems otherwise negative ____________________________________________   PHYSICAL EXAM:  VITAL SIGNS: ED Triage Vitals  Enc Vitals Group     BP 06/02/15 1148 125/78 mmHg     Pulse Rate 06/02/15 1148 99     Resp 06/02/15 1148 18     Temp 06/02/15 1148 97.9 F (36.6 C)     Temp Source 06/02/15 1148 Oral     SpO2 06/02/15 1148 95 %     Weight 06/02/15 1148 210 lb (95.255 kg)     Height 06/02/15 1148 5\' 8"  (1.727 m)     Head Cir --      Peak Flow --      Pain Score 06/02/15 1158 4     Pain Loc --      Pain Edu? --      Excl. in [place]? --      Constitutional: Alert  and oriented. Well appearing and in no distress. Eyes: Conjunctivae are normal. PERRL. Normal extraocular movements. ENT   Head: Normocephalic and atraumatic.   Nose: No congestion/rhinnorhea.   Mouth/Throat: Mucous membranes are moist.   Neck: No stridor. Cardiovascular/Chest: Normal rate, regular rhythm.  No murmurs, rubs, or gallops. Respiratory: Normal respiratory effort without tachypnea nor retractions. Breath sounds are clear and equal bilaterally. No wheezes/rales/rhonchi. Gastrointestinal: Soft. No distention, no guarding, no rebound. Nontender   Genitourinary/rectal:Deferred Musculoskeletal: Nontender with normal range of motion in all extremities. No joint effusions.  No lower extremity tenderness  nor edema. Neurologic:  Normal speech and language. No gross or focal neurologic deficits are appreciated. Skin:  Skin is warm, dry and intact. No rash noted. Psychiatric: Mood and affect are normal. Speech and behavior are normal. Patient exhibits appropriate insight and judgment.  ____________________________________________   EKG I, Lisa Roca, MD, the attending physician have personally viewed and interpreted all ECGs.  98 bpm. Normal sinus rhythm. Normal axis. Narrow QRS. T wave inversions inferior laterally. T-wave inversions are similar prior EKG in January when he 18. ____________________________________________  LABS (pertinent positives/negatives)  White blood count 7.6, hemoglobin 15.5, platelet count 149 Urinalysis within normal limits Troponin less than 1.88 Metabolic panel showing chloride 100, CO2 21, glucose 108, anion gap 16  ____________________________________________  RADIOLOGY All Xrays were viewed by me. Imaging interpreted by Radiologist.  Chest x-ray portable: Scarring left base. No edema or consolidation. __________________________________________  PROCEDURES  Procedure(s) performed: None Critical Care performed: None  ____________________________________________   ED COURSE / ASSESSMENT AND PLAN  CONSULTATIONS: None  Pertinent labs & imaging results that were available during my care of the patient were reviewed by me and considered in my medical decision making (see chart for details).  The patient is most concerned that his psychiatric medication changes are leaving him with increased panic attacks and anxiety. He is a little bit orthostatic in terms of drop in blood pressure and increased heart rate, and I discussed this with him and asked him to make sure he staying well-hydrated. This could be result of the extreme heat, or possibly medication side effect. We discussed that I will not be changing his psychiatric medications and he needs to  get a follow-up appointment with his psychiatrist.  The chest discomfort sounds unlikely to be acute coronary syndrome. His blood work is pending still after calling the lab there were multiple delays in obtaining the results. The patient does have an appointment at 3:30 with his primary care physician Dr. Ouida Sills and he would really like to make this appointment and not have to reschedule it. Patient wants to leave without waiting for the results of the metabolic panel, troponin, and chest x-ray result. We discussed risks associated with missed diagnosis, including heart attack, kidney failure, electrolyte disturbances, and the patient would like to go on to his primary care follow-up and get the results there.  Labs were updated to the chart after patient discharge.  Patient / Family / Caregiver informed of clinical course, medical decision-making process, and agree with plan.   I discussed return precautions, follow-up instructions, and discharged instructions with patient and/or family.  ___________________________________________   FINAL CLINICAL IMPRESSION(S) / ED DIAGNOSES   Final diagnoses:  Panic attack  Episodic lightheadedness    FOLLOW UP  Referred to: Dr. Ouida Sills, today as scheduled. [place], MD 06/02/15 [phone]

## 2015-06-02 NOTE — ED Notes (Signed)
Patient states he believes he has been feeling light-headed due to recent decrease and cessation of antidepressants. States he was on two medications that were working well for him, but his doctor wanted to change his medicines. Patient states since changes, he has been feeling light-headed and "like I'm going to black out". Patient denies any change in depressive symptoms and states he feels "frustrated, but otherwise ok". Patient has appt with PCP this afternoon and plans to discuss medications then.

## 2015-06-02 NOTE — ED Notes (Signed)
Called lab to check on troponin and BMP results. Labs still running and will be resulted shortly per lab staff.

## 2015-06-02 NOTE — ED Notes (Signed)
Patient went to straight to PCP's office.

## 2015-06-02 NOTE — Discharge Instructions (Signed)
No certain cause was found for your lightheadedness and feelings of uneasiness. However I suspect your having anxiety/panic attacks, ongoing since medication adjustments with your psychiatrist. He will need to discuss with your psychiatrist any additional changes in medication management. Return to the emergency department for any new or worsening condition including passing out, weakness, numbness, slurred speech, altered mental status, chest pain, trouble breathing, fever, or any other symptoms concerning to you.  Your blood pressure did drop slightly into her heart rate increased slightly with standing which may indicate some level of low blood volume such as dehydration. Make sure you drinking plenty of fluids and take care when changing positions and sit down in case you feel dizzy.  You left the ED before your metabolic panel and troponin heart enzyme were resulted. He left before the results from the chest x-ray were resulted. We discussed the risk of missed diagnosis including a heart attack, electrolyte problems, and you understand the risk of worsening condition or death due to missed diagnosis. Dr. Ouida Sills will be also able to look in the computer and see the results of these within the hour.   Dizziness Dizziness is a common problem. It is a feeling of unsteadiness or light-headedness. You may feel like you are about to faint. Dizziness can lead to injury if you stumble or fall. A person of any age group can suffer from dizziness, but dizziness is more common in older adults. CAUSES  Dizziness can be caused by many different things, including:  Middle ear problems.  Standing for too long.  Infections.  An allergic reaction.  Aging.  An emotional response to something, such as the sight of blood.  Side effects of medicines.  Tiredness.  Problems with circulation or blood pressure.  Excessive use of alcohol or medicines, or illegal drug use.  Breathing too fast  (hyperventilation).  An irregular heart rhythm (arrhythmia).  A low red blood cell count (anemia).  Pregnancy.  Vomiting, diarrhea, fever, or other illnesses that cause body fluid loss (dehydration).  Diseases or conditions such as Parkinson's disease, high blood pressure (hypertension), diabetes, and thyroid problems.  Exposure to extreme heat. DIAGNOSIS  Your health care provider will ask about your symptoms, perform a physical exam, and perform an electrocardiogram (ECG) to record the electrical activity of your heart. Your health care provider may also perform other heart or blood tests to determine the cause of your dizziness. These may include:  Transthoracic echocardiogram (TTE). During echocardiography, sound waves are used to evaluate how blood flows through your heart.  Transesophageal echocardiogram (TEE).  Cardiac monitoring. This allows your health care provider to monitor your heart rate and rhythm in real time.  Holter monitor. This is a portable device that records your heartbeat and can help diagnose heart arrhythmias. It allows your health care provider to track your heart activity for several days if needed.  Stress tests by exercise or by giving medicine that makes the heart beat faster. TREATMENT  Treatment of dizziness depends on the cause of your symptoms and can vary greatly. HOME CARE INSTRUCTIONS   Drink enough fluids to keep your urine clear or pale yellow. This is especially important in very hot weather. In older adults, it is also important in cold weather.  Take your medicine exactly as directed if your dizziness is caused by medicines. When taking blood pressure medicines, it is especially important to get up slowly.  Rise slowly from chairs and steady yourself until you feel okay.  In the  morning, first sit up on the side of the bed. When you feel okay, stand slowly while holding onto something until you know your balance is fine.  Move your legs  often if you need to stand in one place for a long time. Tighten and relax your muscles in your legs while standing.  Have someone stay with you for 1-2 days if dizziness continues to be a problem. Do this until you feel you are well enough to stay alone. Have the person call your health care provider if he or she notices changes in you that are concerning.  Do not drive or use heavy machinery if you feel dizzy.  Do not drink alcohol. SEEK IMMEDIATE MEDICAL CARE IF:   Your dizziness or light-headedness gets worse.  You feel nauseous or vomit.  You have problems talking, walking, or using your arms, hands, or legs.  You feel weak.  You are not thinking clearly or you have trouble forming sentences. It may take a friend or family member to notice this.  You have chest pain, abdominal pain, shortness of breath, or sweating.  Your vision changes.  You notice any bleeding.  You have side effects from medicine that seems to be getting worse rather than better. MAKE SURE YOU:   Understand these instructions.  Will watch your condition.  Will get help right away if you are not doing well or get worse. Document Released: 04/17/2001 Document Revised: 10/27/2013 Document Reviewed: 05/11/2011 [facility] Patient Information 2015 [place], [place]. This information is not intended to replace advice given to you by your health care provider. Make sure you discuss any questions you have with your health care provider.  Panic Attacks Panic attacks are sudden, short-livedsurges of severe anxiety, fear, or discomfort. They may occur for no reason when you are relaxed, when you are anxious, or when you are sleeping. Panic attacks may occur for a number of reasons:   Healthy people occasionally have panic attacks in extreme, life-threatening situations, such as war or natural disasters. Normal anxiety is a protective mechanism of the body that helps [place] react to danger (fight or flight  response).  Panic attacks are often seen with anxiety disorders, such as panic disorder, social anxiety disorder, generalized anxiety disorder, and phobias. Anxiety disorders cause excessive or uncontrollable anxiety. They may interfere with your relationships or other life activities.  Panic attacks are sometimes seen with other mental illnesses, such as depression and posttraumatic stress disorder.  Certain medical conditions, prescription medicines, and drugs of abuse can cause panic attacks. SYMPTOMS  Panic attacks start suddenly, peak within 20 minutes, and are accompanied by four or more of the following symptoms:  Pounding heart or fast heart rate (palpitations).  Sweating.  Trembling or shaking.  Shortness of breath or feeling smothered.  Feeling choked.  Chest pain or discomfort.  Nausea or strange feeling in your stomach.  Dizziness, light-headedness, or feeling like you will faint.  Chills or hot flushes.  Numbness or tingling in your lips or hands and feet.  Feeling that things are not real or feeling that you are not yourself.  Fear of losing control or going crazy.  Fear of dying. Some of these symptoms can mimic serious medical conditions. For example, you may think you are having a heart attack. Although panic attacks can be very scary, they are not life threatening. DIAGNOSIS  Panic attacks are diagnosed through an assessment by your health care provider. Your health care provider will ask questions about your  symptoms, such as where and when they occurred. Your health care provider will also ask about your medical history and use of alcohol and drugs, including prescription medicines. Your health care provider may order blood tests or other studies to rule out a serious medical condition. Your health care provider may refer you to a mental health professional for further evaluation. TREATMENT   Most healthy people who have one or two panic attacks in an  extreme, life-threatening situation will not require treatment.  The treatment for panic attacks associated with anxiety disorders or other mental illness typically involves counseling with a mental health professional, medicine, or a combination of both. Your health care provider will help determine what treatment is best for you.  Panic attacks due to physical illness usually go away with treatment of the illness. If prescription medicine is causing panic attacks, talk with your health care provider about stopping the medicine, decreasing the dose, or substituting another medicine.  Panic attacks due to alcohol or drug abuse go away with abstinence. Some adults need professional help in order to stop drinking or using drugs. HOME CARE INSTRUCTIONS   Take all medicines as directed by your health care provider.   Schedule and attend follow-up visits as directed by your health care provider. It is important to keep all your appointments. SEEK MEDICAL CARE IF:  You are not able to take your medicines as prescribed.  Your symptoms do not improve or get worse. SEEK IMMEDIATE MEDICAL CARE IF:   You experience panic attack symptoms that are different than your usual symptoms.  You have serious thoughts about hurting yourself or others.  You are taking medicine for panic attacks and have a serious side effect. MAKE SURE YOU:  Understand these instructions.  Will watch your condition.  Will get help right away if you are not doing well or get worse. Document Released: 10/22/2005 Document Revised: 10/27/2013 Document Reviewed: 06/05/2013 [facility] Patient Information 2015 [place], [place]. This information is not intended to replace advice given to you by your health care provider. Make sure you discuss any questions you have with your health care provider.

## 2015-08-09 ENCOUNTER — Encounter: Payer: Self-pay | Admitting: Emergency Medicine

## 2015-08-09 ENCOUNTER — Emergency Department
Admission: EM | Admit: 2015-08-09 | Discharge: 2015-08-09 | Disposition: A | Discharge status: Home / Self Care | Payer: Medicare Other | Attending: Emergency Medicine | Admitting: Emergency Medicine

## 2015-08-09 DIAGNOSIS — B349 Viral infection, unspecified: Secondary | ICD-10-CM | POA: Diagnosis not present

## 2015-08-09 DIAGNOSIS — Z720 Tobacco use: Secondary | ICD-10-CM | POA: Diagnosis not present

## 2015-08-09 DIAGNOSIS — R109 Unspecified abdominal pain: Secondary | ICD-10-CM | POA: Diagnosis present

## 2015-08-09 DIAGNOSIS — R1084 Generalized abdominal pain: Secondary | ICD-10-CM

## 2015-08-09 HISTORY — DX: Acute pancreatitis without necrosis or infection, unspecified: K85.90

## 2015-08-09 HISTORY — DX: Other pulmonary embolism without acute cor pulmonale: I26.99

## 2015-08-09 HISTORY — DX: Acute respiratory distress syndrome: J80

## 2015-08-09 LAB — CBC WITH DIFFERENTIAL/PLATELET
BASOS PCT: 0 %
Basophils Absolute: 0 10*3/uL (ref 0–0.1)
Eosinophils Absolute: 0 10*3/uL (ref 0–0.7)
Eosinophils Relative: 0 %
HCT: 44 % (ref 40.0–52.0)
Hemoglobin: 14.9 g/dL (ref 13.0–18.0)
LYMPHS ABS: 1.1 10*3/uL (ref 1.0–3.6)
Lymphocytes Relative: 15 %
MCH: 31.5 pg (ref 26.0–34.0)
MCHC: 33.8 g/dL (ref 32.0–36.0)
MCV: 93.1 fL (ref 80.0–100.0)
Monocytes Absolute: 0.5 10*3/uL (ref 0.2–1.0)
Monocytes Relative: 6 %
Neutro Abs: 6 10*3/uL (ref 1.4–6.5)
Neutrophils Relative %: 79 %
Platelets: 147 10*3/uL — ABNORMAL LOW (ref 150–440)
RBC: 4.72 MIL/uL (ref 4.40–5.90)
RDW: 13.9 % (ref 11.5–14.5)
WBC: 7.6 10*3/uL (ref 3.8–10.6)

## 2015-08-09 LAB — COMPREHENSIVE METABOLIC PANEL
ALBUMIN: 4.5 g/dL (ref 3.5–5.0)
ALK PHOS: 104 U/L (ref 38–126)
ALT: 38 U/L (ref 17–63)
AST: 28 U/L (ref 15–41)
Anion gap: 9 (ref 5–15)
BUN: 10 mg/dL (ref 6–20)
CALCIUM: 9 mg/dL (ref 8.9–10.3)
CO2: 25 mmol/L (ref 22–32)
CREATININE: 0.73 mg/dL (ref 0.61–1.24)
Chloride: 105 mmol/L (ref 101–111)
GFR calc Af Amer: >60 mL/min (ref >60)
GFR calc non Af Amer: >60 mL/min (ref >60)
GLUCOSE: 170 mg/dL — AB (ref 65–99)
Potassium: 3.6 mmol/L (ref 3.5–5.1)
Sodium: 139 mmol/L (ref 135–145)
Total Bilirubin: 0.4 mg/dL (ref 0.3–1.2)
Total Protein: 7.3 g/dL (ref 6.5–8.1)

## 2015-08-09 LAB — URINALYSIS COMPLETE WITH MICROSCOPIC (ARMC ONLY)
Bilirubin Urine: NEGATIVE
Glucose, UA: NEGATIVE mg/dL
Hgb urine dipstick: NEGATIVE
Leukocytes, UA: NEGATIVE
Nitrite: NEGATIVE
PH: 5 (ref 5.0–8.0)
PROTEIN: NEGATIVE mg/dL
Specific Gravity, Urine: 1.02 (ref 1.005–1.030)
Squamous Epithelial / LPF: NONE SEEN

## 2015-08-09 LAB — TROPONIN I

## 2015-08-09 LAB — LIPASE, BLOOD: Lipase: 31 U/L (ref 22–51)

## 2015-08-09 NOTE — ED Provider Notes (Signed)
[facility] Emergency Department Provider Note     Time seen: ----------------------------------------- 8:38 PM on 08/09/2015 -----------------------------------------    I have reviewed the triage vital signs and the nursing notes.   HISTORY  Chief Complaint Abdominal Pain; Nasal Congestion; and Cough    HPI Shaun Odonnell is a 49 y.o. male who presents ER for abdominal pain, back pain. Patient said a history of cough congestion and fever and chills. States symptoms started on Saturday. He was sent from the walk-in clinic today to be evaluated for possible pancreatitis. Upper abdominal pain is severe, nothing makes it better or worse. Also states she's had vomiting.   Past Medical History  Diagnosis Date  . Schizoaffective disorder ([place])   . Fatty liver   . Borderline diabetes   . Pancreatitis   . ARDS (adult respiratory distress syndrome) ([place])   . Pulmonary embolism ([facility])     There are no active problems to display for this patient.   History reviewed. No pertinent past surgical history.  Allergies Klonopin; Morphine and related; and Stelazine  Social History Social History  Substance Use Topics  . Smoking status: Current Every Day Smoker  . Smokeless tobacco: None  . Alcohol Use: No    Review of Systems Constitutional: Positive for fever Eyes: Negative for visual changes. ENT: Negative for sore throat. Cardiovascular: Negative for chest pain. Respiratory: Negative for shortness of breath. Gastrointestinal: Positive for abdominal pain Genitourinary: Negative for dysuria. Musculoskeletal: Negative for back pain. Skin: Negative for rash. Neurological: Negative for headaches, focal weakness or numbness.  10-point ROS otherwise negative.  ____________________________________________   PHYSICAL EXAM:  VITAL SIGNS: ED Triage Vitals  Enc Vitals Group     BP 08/09/15 1751 131/80 mmHg     Pulse Rate 08/09/15 1751 109      Resp 08/09/15 1751 20     Temp 08/09/15 1751 99.2 F (37.3 C)     Temp Source 08/09/15 1751 Oral     SpO2 08/09/15 1751 96 %     Weight 08/09/15 1751 210 lb (95.255 kg)     Height 08/09/15 1751 5\' 8"  (1.727 m)     Head Cir --      Peak Flow --      Pain Score 08/09/15 1805 7     Pain Loc --      Pain Edu? --      Excl. in [place]? --     Constitutional: Alert and oriented. Well appearing and in no distress. Eyes: Conjunctivae are normal. PERRL. Normal extraocular movements. ENT   Head: Normocephalic and atraumatic.   Nose: No congestion/rhinnorhea.   Mouth/Throat: Mucous membranes are moist.   Neck: No stridor. Cardiovascular: Normal rate, regular rhythm. Normal and symmetric distal pulses are present in all extremities. No murmurs, rubs, or gallops. Respiratory: Normal respiratory effort without tachypnea nor retractions. Breath sounds are clear and equal bilaterally. No wheezes/rales/rhonchi. Gastrointestinal: Soft and nontender. No distention. No abdominal bruits.  Musculoskeletal: Nontender with normal range of motion in all extremities. No joint effusions.  No lower extremity tenderness nor edema. Neurologic:  Normal speech and language. No gross focal neurologic deficits are appreciated. Speech is normal. No gait instability. Skin:  Skin is warm, dry and intact. No rash noted. Psychiatric: Mood and affect are normal. Speech and behavior are normal. Patient exhibits appropriate insight and judgment.  ____________________________________________  ED COURSE:  Pertinent labs & imaging results that were available during my care of the patient were reviewed by  me and considered in my medical decision making (see chart for details). Patient is in no acute distress, will check basic labs and reevaluate.  EKG: Interpreted by me, sinus tachycardia with a rate of 107 bpm, nonspecific T wave changes, long QT. Normal axis. No evidence of acute  infarction. ____________________________________________    LABS (pertinent positives/negatives)  Labs Reviewed  CBC WITH DIFFERENTIAL/PLATELET - Abnormal; Notable for the following:    Platelets 147 (*)    All other components within normal limits  COMPREHENSIVE METABOLIC PANEL - Abnormal; Notable for the following:    Glucose, Bld 170 (*)    All other components within normal limits  URINALYSIS COMPLETEWITH MICROSCOPIC ([facility] ONLY) - Abnormal; Notable for the following:    Color, Urine YELLOW (*)    APPearance CLEAR (*)    Ketones, ur TRACE (*)    Bacteria, UA RARE (*)    All other components within normal limits  LIPASE, BLOOD  TROPONIN I    ____________________________________________  FINAL ASSESSMENT AND PLAN  Viral syndrome, abdominal pain  Plan: Patient with labs and imaging as dictated above. Patient was requesting Dilaudid for pain here. His labs and urine are unremarkable. Advise close follow-up with his doctor. I offered a Toradol shot which he refused. He is stable for outpatient follow-up   Earleen Newport, MD   Earleen Newport, MD 08/09/15 2040

## 2015-08-09 NOTE — ED Notes (Signed)
Pt to ED with c/o LUQ abd. Pain, chest pain, mid back pain, hx of pancreatitis, also having cough, congestion and fever/chills, states symptoms started on Saturday

## 2015-08-09 NOTE — Discharge Instructions (Signed)
Abdominal Pain, Adult Many things can cause abdominal pain. Usually, abdominal pain is not caused by a disease and will improve without treatment. It can often be observed and treated at home. Your health care provider will do a physical exam and possibly order blood tests and X-rays to help determine the seriousness of your pain. However, in many cases, more time must pass before a clear cause of the pain can be found. Before that point, your health care provider may not know if you need more testing or further treatment. HOME CARE INSTRUCTIONS Monitor your abdominal pain for any changes. The following actions may help to alleviate any discomfort you are experiencing:  Only take over-the-counter or prescription medicines as directed by your health care provider.  Do not take laxatives unless directed to do so by your health care provider.  Try a clear liquid diet (broth, tea, or water) as directed by your health care provider. Slowly move to a bland diet as tolerated. SEEK MEDICAL CARE IF:  You have unexplained abdominal pain.  You have abdominal pain associated with nausea or diarrhea.  You have pain when you urinate or have a bowel movement.  You experience abdominal pain that wakes you in the night.  You have abdominal pain that is worsened or improved by eating food.  You have abdominal pain that is worsened with eating fatty foods.  You have a fever. SEEK IMMEDIATE MEDICAL CARE IF:  Your pain does not go away within 2 hours.  You keep throwing up (vomiting).  Your pain is felt only in portions of the abdomen, such as the right side or the left lower portion of the abdomen.  You pass bloody or black tarry stools. MAKE SURE YOU:  Understand these instructions.  Will watch your condition.  Will get help right away if you are not doing well or get worse.   This information is not intended to replace advice given to you by your health care provider. Make sure you discuss  any questions you have with your health care provider.   Document Released: 08/01/2005 Document Revised: 07/13/2015 Document Reviewed: 07/01/2013 Elsevier Interactive Patient Education 2016 Elsevier Inc[employer]  Viral Infections A viral infection can be caused by different types of viruses.Most viral infections are not serious and resolve on their own. However, some infections may cause severe symptoms and may lead to further complications. SYMPTOMS Viruses can frequently cause:  Minor sore throat.  Aches and pains.  Headaches.  Runny nose.  Different types of rashes.  Watery eyes.  Tiredness.  Cough.  Loss of appetite.  Gastrointestinal infections, resulting in nausea, vomiting, and diarrhea. These symptoms do not respond to antibiotics because the infection is not caused by bacteria. However, you might catch a bacterial infection following the viral infection. This is sometimes called a "superinfection." Symptoms of such a bacterial infection may include:  Worsening sore throat with pus and difficulty swallowing.  Swollen neck glands.  Chills and a high or persistent fever.  Severe headache.  Tenderness over the sinuses.  Persistent overall ill feeling (malaise), muscle aches, and tiredness (fatigue).  Persistent cough.  Yellow, green, or brown mucus production with coughing. HOME CARE INSTRUCTIONS   Only take over-the-counter or prescription medicines for pain, discomfort, diarrhea, or fever as directed by your caregiver.  Drink enough water and fluids to keep your urine clear or pale yellow. Sports drinks can provide valuable electrolytes, sugars, and hydration.  Get plenty of rest and maintain proper nutrition. Soups and broths  with crackers or rice are fine. SEEK IMMEDIATE MEDICAL CARE IF:   You have severe headaches, shortness of breath, chest pain, neck pain, or an unusual rash.  You have uncontrolled vomiting, diarrhea, or you are unable to keep down  fluids.  You or your child has an oral temperature above 102 F (38.9 C), not controlled by medicine.  Your baby is older than 3 months with a rectal temperature of 102 F (38.9 C) or higher.  Your baby is 83 months old or younger with a rectal temperature of 100.4 F (38 C) or higher. MAKE SURE YOU:   Understand these instructions.  Will watch your condition.  Will get help right away if you are not doing well or get worse.   This information is not intended to replace advice given to you by your health care provider. Make sure you discuss any questions you have with your health care provider.   Document Released: 08/01/2005 Document Revised: 01/14/2012 Document Reviewed: 03/30/2015 Elsevier Interactive Patient Education [employer].

## 2016-09-25 ENCOUNTER — Emergency Department: Payer: Medicare Other

## 2016-09-25 ENCOUNTER — Emergency Department
Admission: EM | Admit: 2016-09-25 | Discharge: 2016-09-25 | Disposition: A | Discharge status: Home / Self Care | Payer: Medicare Other | Attending: Emergency Medicine | Admitting: Emergency Medicine

## 2016-09-25 ENCOUNTER — Encounter: Payer: Self-pay | Admitting: Emergency Medicine

## 2016-09-25 DIAGNOSIS — R0600 Dyspnea, unspecified: Secondary | ICD-10-CM

## 2016-09-25 DIAGNOSIS — I10 Essential (primary) hypertension: Secondary | ICD-10-CM | POA: Insufficient documentation

## 2016-09-25 DIAGNOSIS — J40 Bronchitis, not specified as acute or chronic: Secondary | ICD-10-CM | POA: Diagnosis not present

## 2016-09-25 DIAGNOSIS — R0602 Shortness of breath: Secondary | ICD-10-CM | POA: Diagnosis present

## 2016-09-25 DIAGNOSIS — J441 Chronic obstructive pulmonary disease with (acute) exacerbation: Secondary | ICD-10-CM | POA: Diagnosis not present

## 2016-09-25 DIAGNOSIS — F17200 Nicotine dependence, unspecified, uncomplicated: Secondary | ICD-10-CM | POA: Diagnosis not present

## 2016-09-25 HISTORY — DX: Essential (primary) hypertension: I10

## 2016-09-25 LAB — FIBRIN DERIVATIVES D-DIMER (ARMC ONLY): Fibrin derivatives D-dimer (ARMC): 567 — ABNORMAL HIGH (ref 0–499)

## 2016-09-25 LAB — CBC
HEMATOCRIT: 48.7 % (ref 40.0–52.0)
HEMOGLOBIN: 16.9 g/dL (ref 13.0–18.0)
MCH: 33 pg (ref 26.0–34.0)
MCHC: 34.6 g/dL (ref 32.0–36.0)
MCV: 95.2 fL (ref 80.0–100.0)
PLATELETS: 148 10*3/uL — AB (ref 150–440)
RBC: 5.11 MIL/uL (ref 4.40–5.90)
RDW: 13.9 % (ref 11.5–14.5)
WBC: 6.4 10*3/uL (ref 3.8–10.6)

## 2016-09-25 LAB — BASIC METABOLIC PANEL
Anion gap: 9 (ref 5–15)
BUN: 8 mg/dL (ref 6–20)
CHLORIDE: 100 mmol/L — AB (ref 101–111)
CO2: 27 mmol/L (ref 22–32)
Calcium: 9.3 mg/dL (ref 8.9–10.3)
Creatinine, Ser: 0.71 mg/dL (ref 0.61–1.24)
GFR calc Af Amer: >60 mL/min (ref >60)
GFR calc non Af Amer: >60 mL/min (ref >60)
GLUCOSE: 109 mg/dL — AB (ref 65–99)
POTASSIUM: 4.1 mmol/L (ref 3.5–5.1)
Sodium: 136 mmol/L (ref 135–145)

## 2016-09-25 LAB — TROPONIN I: Troponin I: <0.03 ng/mL (ref <0.03)

## 2016-09-25 MED ORDER — IPRATROPIUM-ALBUTEROL 0.5-2.5 (3) MG/3ML IN SOLN
RESPIRATORY_TRACT | Status: AC
  Filled 2016-09-25: qty 3

## 2016-09-25 MED ORDER — ALBUTEROL SULFATE (2.5 MG/3ML) 0.083% IN NEBU: 5.0000 mg | INHALATION_SOLUTION | Freq: Once | RESPIRATORY_TRACT | Status: DC

## 2016-09-25 MED ORDER — AMOXICILLIN-POT CLAVULANATE 875-125 MG PO TABS: 1.0000 | ORAL_TABLET | Freq: Two times a day (BID) | ORAL | 0 refills | Status: DC

## 2016-09-25 MED ORDER — IPRATROPIUM-ALBUTEROL 0.5-2.5 (3) MG/3ML IN SOLN: 3.0000 mL | Freq: Once | RESPIRATORY_TRACT | Status: DC

## 2016-09-25 MED ORDER — PREDNISONE 20 MG PO TABS: 20.0000 mg | ORAL_TABLET | Freq: Every day | ORAL | 0 refills | Status: DC

## 2016-09-25 MED ORDER — IOPAMIDOL (ISOVUE-370) INJECTION 76%
75.0000 mL | Freq: Once | INTRAVENOUS | Status: AC | PRN
  Administered 2016-09-25: 75 mL via INTRAVENOUS
  Filled 2016-09-25: qty 75

## 2016-09-25 MED ORDER — BENZONATATE 100 MG PO CAPS: 100.0000 mg | ORAL_CAPSULE | Freq: Four times a day (QID) | ORAL | 0 refills | Status: DC | PRN

## 2016-09-25 MED ORDER — ALPRAZOLAM 0.25 MG PO TABS: 0.1250 mg | ORAL_TABLET | Freq: Once | ORAL | Status: DC

## 2016-09-25 MED ORDER — AZITHROMYCIN 250 MG PO TABS: 500.0000 mg | ORAL_TABLET | Freq: Once | ORAL | Status: DC

## 2016-09-25 MED ORDER — PREDNISONE 20 MG PO TABS
20.0000 mg | ORAL_TABLET | Freq: Once | ORAL | Status: AC
  Administered 2016-09-25: 20 mg via ORAL
  Filled 2016-09-25: qty 1

## 2016-09-25 MED ORDER — IPRATROPIUM-ALBUTEROL 0.5-2.5 (3) MG/3ML IN SOLN
3.0000 mL | Freq: Once | RESPIRATORY_TRACT | Status: AC
  Administered 2016-09-25: 3 mL via RESPIRATORY_TRACT
  Filled 2016-09-25: qty 3

## 2016-09-25 MED ORDER — AMOXICILLIN-POT CLAVULANATE 875-125 MG PO TABS
1.0000 | ORAL_TABLET | Freq: Once | ORAL | Status: AC
  Administered 2016-09-25: 1 via ORAL
  Filled 2016-09-25 (×2): qty 1

## 2016-09-25 MED ORDER — ALBUTEROL SULFATE HFA 108 (90 BASE) MCG/ACT IN AERS: 2.0000 | INHALATION_SPRAY | Freq: Four times a day (QID) | RESPIRATORY_TRACT | 2 refills | Status: DC | PRN

## 2016-09-25 NOTE — ED Notes (Signed)
Returned from CT scan.

## 2016-09-25 NOTE — ED Notes (Signed)
Pt ambulated to bathroom without difficulty. NAD. Waiting to go for CT angio.

## 2016-09-25 NOTE — ED Provider Notes (Signed)
[facility] Emergency Department Provider Note   ____________________________________________   First MD Initiated Contact with Patient 09/25/16 [phone]     (approximate)  I have reviewed the triage vital signs and the nursing notes.   HISTORY  Chief Complaint Shortness of Breath and Cough    HPI Shaun Odonnell is a 50 y.o. male reports he's been diagnosed "COPD and bronchitis" and has had to have treatment with inhalers and has been treated in August as well as October for same symptoms with relief after a couple of days on an inhaler and antibiotic.  Patient reports that for the last 2-3 days he's been having a feeling of scratchiness in the back of throat, dry cough, and also wheezing. He felt short of breath throughout the day today and a slight feeling of "tightness" in the chest for the last 2 days. Reports same symptoms twice in the last 3 months both diagnosed and treated with improvement as "COPD and bronchitis". He does carry a history of a prior pulmonary embolism, but reports this occurred while he was in the ICU for several days with a diagnosis of ARDS due to severe pancreatitis. He stopped using alcohol, and reports he has not had any further problems with his abdomen or pancreatitis, and is doing much better.  Denies any leg swelling. No hemoptysis, no blood in his sputum. No chest pain with inspiration, no sharp discomfort in the chest. No nausea or vomiting or abdominal pain.  Does report that he developed a feeling of anxiety on prednisone the past, and Dr. previously advised him that he may need to use a lower dose in the future  Presently denies any hallucinations, symptoms of depression, or schizophrenia.  Past Medical History:  Diagnosis Date  . ARDS (adult respiratory distress syndrome) ([place])   . Borderline diabetes   . Fatty liver   . Hypertension   . Pancreatitis   . Pulmonary embolism ([place])   . Schizoaffective disorder ([place])       There are no active problems to display for this patient.   History reviewed. No pertinent surgical history.  Prior to Admission medications   Medication Sig Start Date End Date Taking? Authorizing Provider  albuterol (PROVENTIL HFA;VENTOLIN HFA) 108 (90 Base) MCG/ACT inhaler Inhale 2 puffs into the lungs every 6 (six) hours as needed for wheezing or shortness of breath. 09/25/16   Delman Kitten, MD  ALPRAZolam Duanne Moron) 0.25 MG tablet Take 0.125 mg by mouth 3 (three) times daily.    Historical Provider, MD  amoxicillin-clavulanate (AUGMENTIN) 875-125 MG tablet Take 1 tablet by mouth 2 (two) times daily. 09/25/16   Delman Kitten, MD  clozapine (CLOZARIL) 200 MG tablet Take 250 mg by mouth 2 (two) times daily.    Historical Provider, MD  escitalopram (LEXAPRO) 10 MG tablet Take 30 mg by mouth daily.    Historical Provider, MD  predniSONE (DELTASONE) 20 MG tablet Take 1 tablet (20 mg total) by mouth daily with breakfast. 09/25/16   Delman Kitten, MD    Allergies Klonopin [clonazepam]; Morphine and related; and Stelazine [trifluoperazine]  No family history on file.  Social History Social History  Substance Use Topics  . Smoking status: Current Every Day Smoker  . Smokeless tobacco: Never Used  . Alcohol use No    Review of Systems Constitutional: No fever/chills Eyes: No visual changes. ENT: No sore throat.Feels scratchy at times Cardiovascular: Denies chest pain. Respiratory: Mild shortness of breath, dry nonproductive cough. Denies any severe  shortness of breath. Reports wheezing with expiration Gastrointestinal: No abdominal pain.  No nausea, no vomiting.  No diarrhea.  No constipation. Genitourinary: Negative for dysuria. Musculoskeletal: Negative for back pain. Skin: Negative for rash. Neurological: Negative for headaches, focal weakness or numbness.  10-point ROS otherwise negative.  ____________________________________________   PHYSICAL EXAM:  VITAL SIGNS: ED Triage  Vitals  Enc Vitals Group     BP 09/25/16 1421 (!) 143/78     Pulse Rate 09/25/16 1421 81     Resp 09/25/16 1421 18     Temp 09/25/16 1421 98.1 F (36.7 C)     Temp Source 09/25/16 1421 Oral     SpO2 09/25/16 1421 94 %     Weight 09/25/16 1420 215 lb (97.5 kg)     Height 09/25/16 1420 5\' 8"  (1.727 m)     Head Circumference --      Peak Flow --      Pain Score 09/25/16 1423 4     Pain Loc --      Pain Edu? --      Excl. in [place]? --     Constitutional: Alert and oriented. Well appearing and in no acute distress. Eyes: Conjunctivae are normal. PERRL. EOMI. Head: Atraumatic. Nose: No congestion/rhinnorhea. Mouth/Throat: Mucous membranes are moist.  Oropharynx non-erythematous. Neck: No stridor.   Cardiovascular: Normal rate, regular rhythm. Grossly normal heart sounds.  Good peripheral circulation. Respiratory: Normal respiratory effort.  No retractions. No focal rales. Does exhibit occasional dry cough. On close auscultation does have mild end expiratory wheezing with expiration. Speaks in full sentences. No hypoxia. Gastrointestinal: Soft and nontender. No distention. No abdominal bruits. No CVA tenderness. Musculoskeletal: No lower extremity tenderness nor edema.  No joint effusions. No thigh tenderness. No swelling. Neurologic:  Normal speech and language. No gross focal neurologic deficits are appreciated.  Skin:  Skin is warm, dry and intact. No rash noted. Psychiatric: Mood and affect are normal. Speech and behavior are normal.  ____________________________________________   LABS (all labs ordered are listed, but only abnormal results are displayed)  Labs Reviewed  FIBRIN DERIVATIVES D-DIMER ([facility] ONLY) - Abnormal; Notable for the following:       Result Value   Fibrin derivatives D-dimer ([facility]) 567 (*)    All other components within normal limits  CBC - Abnormal; Notable for the following:    Platelets 148 (*)    All other components within normal limits  BASIC  METABOLIC PANEL - Abnormal; Notable for the following:    Chloride 100 (*)    Glucose, Bld 109 (*)    All other components within normal limits  TROPONIN I   ____________________________________________  EKG  Reviewed and interpreted by me at 1430 Ventricular rate 75 Normal axis , Rate 70 QRS 92 There is mild prolongation of the QT interval and proximally 490, the patient is a slight biphasic appearance to the terminal portions of the T-wave in V4 and V5, there is no discrete evidence of ischemic abnormality noted, T wave inversions in lead 3 No evidence of ST elevation ____________________________________________  RADIOLOGY  Dg Chest 2 View  Result Date: 09/25/2016 CLINICAL DATA:  Cough and congestion EXAM: CHEST  2 VIEW COMPARISON:  06/02/2015 is most recent available FINDINGS: Mild scarring over the left diaphragm is stable. There is no edema, consolidation, effusion, or pneumothorax. Normal heart size and mediastinal contours. IMPRESSION: No active cardiopulmonary disease. Electronically Signed   By: Monte Fantasia M.D.   On: 09/25/2016 15:07  Ct Angio Chest Pe W Or Wo Contrast  Result Date: 09/25/2016 CLINICAL DATA:  Shortness of breath and wheezing since 09/21/2016. EXAM: CT ANGIOGRAPHY CHEST WITH CONTRAST TECHNIQUE: Multidetector CT imaging of the chest was performed using the standard protocol during bolus administration of intravenous contrast. Multiplanar CT image reconstructions and MIPs were obtained to evaluate the vascular anatomy. CONTRAST:  75 cc Isovue 370 COMPARISON:  PA and lateral chest today and 11/14/2014. FINDINGS: Cardiovascular: No pulmonary embolus is identified. Heart size is normal. No pericardial effusion. Mediastinum/Nodes: No enlarged mediastinal, hilar or axillary lymph nodes. Thyroid gland, trachea, and esophagus demonstrate no significant findings. Lungs/Pleura: No pleural effusion. There is some emphysematous change in the apices. No nodule, mass or  consolidative process. Upper Abdomen: Fatty infiltration of the liver is identified. Otherwise unremarkable. Musculoskeletal: Negative. Review of the MIP images confirms the above findings. IMPRESSION: Negative for pulmonary embolus.  No acute disease. Emphysema. Fatty infiltration of the liver. Electronically Signed   By: Inge Rise M.D.   On: 09/25/2016 17:53    ____________________________________________   PROCEDURES  Procedure(s) performed: None  Procedures  Critical Care performed: No  ____________________________________________   INITIAL IMPRESSION / ASSESSMENT AND PLAN / ED COURSE  Pertinent labs & imaging results that were available during my care of the patient were reviewed by me and considered in my medical decision making (see chart for details).  Patient's for cough, wheezing. Recent treatments for COPD/bronchitis with improvement for similar symptoms. Today's presentation suggests same, no evidence of hypoxia. Respiratory and comfortably but does have an expiratory wheezing throughout. Treatment with DuoNeb abs, prednisone, but utilizes slightly lower dose of prednisone as patient reports he's had symptoms of anxiety on higher dose of prednisone in the past. Presently no evidence of acute psychiatric disease. He does carry a history of a prior PE, but this is in the setting of an ICU stay. He denies any symptomatology to suggest acute PE, no tachycardia, no significant hypoxia, and by well's criteria he is considered low risk and appropriate for screening by d-dimer.  No acute cardiac symptoms, chest tightness is reported that he reports a cyst 2-3 days in the setting of wheezing. Atypical coronary syndrome, we'll check troponin.  Clinical Course    ----------------------------------------- 6:40 PM on 09/25/2016 -----------------------------------------  Lungs clear, patient ambulatory back and forth the bathroom reports feeling much better. No distress, clear  lungs appears much improved. CT negative for acute etiology or pulmonary mode him.  Return precautions and treatment recommendations and follow-up discussed with the patient who is agreeable with the plan.   ____________________________________________   FINAL CLINICAL IMPRESSION(S) / ED DIAGNOSES  Final diagnoses:  Dyspnea  Bronchitis  COPD exacerbation (HCC)      NEW MEDICATIONS STARTED DURING THIS VISIT:  New Prescriptions   ALBUTEROL (PROVENTIL HFA;VENTOLIN HFA) 108 (90 BASE) MCG/ACT INHALER    Inhale 2 puffs into the lungs every 6 (six) hours as needed for wheezing or shortness of breath.   AMOXICILLIN-CLAVULANATE (AUGMENTIN) 875-125 MG TABLET    Take 1 tablet by mouth 2 (two) times daily.   PREDNISONE (DELTASONE) 20 MG TABLET    Take 1 tablet (20 mg total) by mouth daily with breakfast.     Note:  This document was prepared using Dragon voice recognition software and may include unintentional dictation errors.     Delman Kitten, MD 09/25/16 [phone]

## 2016-09-25 NOTE — ED Triage Notes (Signed)
Patient presents to the ED with shortness of breath and wheezing since Saturday morning.  Patient reports shortness of breath is much worse with dyspnea.  Patient also reports coughing often.  Patient speaking in full sentences in triage without obvious difficulty at this time.

## 2016-09-25 NOTE — Discharge Instructions (Signed)
We believe that your symptoms are caused today by an exacerbation of your COPD, and possibly bronchitis.  Please take the prescribed medications and any medications that you have at home for your COPD.  Follow up with your doctor as recommended.  If you develop any new or worsening symptoms, including but not limited to fever, persistent vomiting, worsening shortness of breath, or other symptoms that concern you, please return to the Emergency Department immediately. ° °

## 2017-01-09 ENCOUNTER — Other Ambulatory Visit: Payer: Self-pay | Admitting: Physician Assistant

## 2017-01-09 DIAGNOSIS — R1012 Left upper quadrant pain: Secondary | ICD-10-CM

## 2017-01-11 ENCOUNTER — Ambulatory Visit
Admission: RE | Admit: 2017-01-11 | Discharge: 2017-01-11 | Disposition: A | Discharge status: Home / Self Care | Payer: Medicare Other | Source: Ambulatory Visit | Attending: Physician Assistant | Admitting: Physician Assistant

## 2017-01-11 DIAGNOSIS — R1012 Left upper quadrant pain: Secondary | ICD-10-CM | POA: Insufficient documentation

## 2017-03-07 ENCOUNTER — Ambulatory Visit (HOSPITAL_COMMUNITY): Payer: Self-pay | Admitting: Psychiatry

## 2017-08-20 ENCOUNTER — Encounter: Payer: Self-pay | Admitting: *Deleted

## 2017-08-21 ENCOUNTER — Encounter
Admission: RE | Disposition: A | Discharge status: Home / Self Care | Payer: Self-pay | Source: Ambulatory Visit | Attending: Unknown Physician Specialty

## 2017-08-21 ENCOUNTER — Ambulatory Visit
Admission: RE | Admit: 2017-08-21 | Discharge: 2017-08-21 | Disposition: A | Discharge status: Home / Self Care | Payer: Medicare Other | Source: Ambulatory Visit | Attending: Unknown Physician Specialty | Admitting: Unknown Physician Specialty

## 2017-08-21 ENCOUNTER — Ambulatory Visit: Payer: Medicare Other | Admitting: Anesthesiology

## 2017-08-21 ENCOUNTER — Encounter: Payer: Self-pay | Admitting: *Deleted

## 2017-08-21 DIAGNOSIS — Z888 Allergy status to other drugs, medicaments and biological substances status: Secondary | ICD-10-CM | POA: Insufficient documentation

## 2017-08-21 DIAGNOSIS — I10 Essential (primary) hypertension: Secondary | ICD-10-CM | POA: Diagnosis not present

## 2017-08-21 DIAGNOSIS — D122 Benign neoplasm of ascending colon: Secondary | ICD-10-CM | POA: Insufficient documentation

## 2017-08-21 DIAGNOSIS — K635 Polyp of colon: Secondary | ICD-10-CM | POA: Insufficient documentation

## 2017-08-21 DIAGNOSIS — F319 Bipolar disorder, unspecified: Secondary | ICD-10-CM | POA: Diagnosis not present

## 2017-08-21 DIAGNOSIS — K760 Fatty (change of) liver, not elsewhere classified: Secondary | ICD-10-CM | POA: Insufficient documentation

## 2017-08-21 DIAGNOSIS — Z885 Allergy status to narcotic agent status: Secondary | ICD-10-CM | POA: Insufficient documentation

## 2017-08-21 DIAGNOSIS — D124 Benign neoplasm of descending colon: Secondary | ICD-10-CM | POA: Insufficient documentation

## 2017-08-21 DIAGNOSIS — Z86711 Personal history of pulmonary embolism: Secondary | ICD-10-CM | POA: Diagnosis not present

## 2017-08-21 DIAGNOSIS — R7303 Prediabetes: Secondary | ICD-10-CM | POA: Insufficient documentation

## 2017-08-21 DIAGNOSIS — K621 Rectal polyp: Secondary | ICD-10-CM | POA: Insufficient documentation

## 2017-08-21 DIAGNOSIS — Z1211 Encounter for screening for malignant neoplasm of colon: Secondary | ICD-10-CM | POA: Insufficient documentation

## 2017-08-21 DIAGNOSIS — Z79899 Other long term (current) drug therapy: Secondary | ICD-10-CM | POA: Insufficient documentation

## 2017-08-21 DIAGNOSIS — K640 First degree hemorrhoids: Secondary | ICD-10-CM | POA: Diagnosis not present

## 2017-08-21 DIAGNOSIS — F17200 Nicotine dependence, unspecified, uncomplicated: Secondary | ICD-10-CM | POA: Insufficient documentation

## 2017-08-21 DIAGNOSIS — Z86010 Personal history of colonic polyps: Secondary | ICD-10-CM | POA: Diagnosis not present

## 2017-08-21 DIAGNOSIS — J449 Chronic obstructive pulmonary disease, unspecified: Secondary | ICD-10-CM | POA: Insufficient documentation

## 2017-08-21 DIAGNOSIS — F259 Schizoaffective disorder, unspecified: Secondary | ICD-10-CM | POA: Insufficient documentation

## 2017-08-21 HISTORY — DX: Hyperlipidemia, unspecified: E78.5

## 2017-08-21 HISTORY — DX: Prediabetes: R73.03

## 2017-08-21 HISTORY — DX: Type 2 diabetes mellitus without complications: E11.9

## 2017-08-21 HISTORY — DX: Bipolar disorder, unspecified: F31.9

## 2017-08-21 HISTORY — DX: Chronic obstructive pulmonary disease, unspecified: J44.9

## 2017-08-21 HISTORY — PX: COLONOSCOPY WITH PROPOFOL: SHX5780

## 2017-08-21 LAB — GLUCOSE, CAPILLARY: GLUCOSE-CAPILLARY: 175 mg/dL — AB (ref 65–99)

## 2017-08-21 SURGERY — COLONOSCOPY WITH PROPOFOL
Anesthesia: General

## 2017-08-21 MED ORDER — PROPOFOL 10 MG/ML IV BOLUS
INTRAVENOUS | Status: DC | PRN
  Administered 2017-08-21: 100 mg via INTRAVENOUS

## 2017-08-21 MED ORDER — PROPOFOL 500 MG/50ML IV EMUL
INTRAVENOUS | Status: DC | PRN
  Administered 2017-08-21: 150 ug/kg/min via INTRAVENOUS

## 2017-08-21 MED ORDER — LIDOCAINE HCL (CARDIAC) 20 MG/ML IV SOLN
INTRAVENOUS | Status: DC | PRN
  Administered 2017-08-21: 100 mg via INTRAVENOUS

## 2017-08-21 MED ORDER — LIDOCAINE HCL (PF) 2 % IJ SOLN
INTRAMUSCULAR | Status: AC
  Filled 2017-08-21: qty 10

## 2017-08-21 MED ORDER — LIDOCAINE HCL (PF) 1 % IJ SOLN
INTRAMUSCULAR | Status: AC
  Administered 2017-08-21: 0.3 mL
  Filled 2017-08-21: qty 2

## 2017-08-21 MED ORDER — PROPOFOL 10 MG/ML IV BOLUS
INTRAVENOUS | Status: AC
  Filled 2017-08-21: qty 20

## 2017-08-21 MED ORDER — SODIUM CHLORIDE 0.9 % IV SOLN
INTRAVENOUS | Status: DC
  Administered 2017-08-21: 1000 mL via INTRAVENOUS

## 2017-08-21 MED ORDER — PROPOFOL 500 MG/50ML IV EMUL
INTRAVENOUS | Status: AC
  Filled 2017-08-21: qty 50

## 2017-08-21 NOTE — Anesthesia Post-op Follow-up Note (Signed)
Anesthesia QCDR form completed.        

## 2017-08-21 NOTE — Anesthesia Preprocedure Evaluation (Signed)
Anesthesia Evaluation  Patient identified by MRN, date of birth, ID band Patient awake    Reviewed: Allergy & Precautions, H&P , NPO status , Patient's Chart, lab work & pertinent test results, reviewed documented beta blocker date and time   History of Anesthesia Complications Negative for: history of anesthetic complications  Airway Mallampati: I  TM Distance: >3 FB Neck ROM: full    Dental  (+) Caps, Dental Advidsory Given, Teeth Intact   Pulmonary neg shortness of breath, asthma , neg sleep apnea, COPD, neg recent URI, Current Smoker,  H/o PE and ARDS          Cardiovascular Exercise Tolerance: Good hypertension (history of, but no current meds), (-) angina(-) CAD, (-) Past MI, (-) Cardiac Stents and (-) CABG (-) dysrhythmias (-) Valvular Problems/Murmurs     Neuro/Psych PSYCHIATRIC DISORDERS (Schizoaffective and bipolar) negative neurological ROS     GI/Hepatic negative GI ROS, NAFLD   Endo/Other  diabetes (borderline)  Renal/GU negative Renal ROS  negative genitourinary   Musculoskeletal   Abdominal   Peds  Hematology negative hematology ROS (+)   Anesthesia Other Findings Past Medical History: No date: ARDS (adult respiratory distress syndrome) (HCC) No date: Bipolar disorder ([place]) No date: Borderline diabetes No date: COPD (chronic obstructive pulmonary disease) (HCC) No date: Elevated lipids No date: Fatty liver No date: Hypertension No date: Pancreatitis No date: Pre-diabetes No date: Pulmonary embolism (HCC) No date: Schizoaffective disorder (HCC)   Reproductive/Obstetrics negative OB ROS                             Anesthesia Physical Anesthesia Plan  ASA: III  Anesthesia Plan: General   Post-op Pain Management:    Induction: Intravenous  PONV Risk Score and Plan: 1 and Propofol infusion  Airway Management Planned: Nasal Cannula  Additional Equipment:    Intra-op Plan:   Post-operative Plan:   Informed Consent: I have reviewed the patients History and Physical, chart, labs and discussed the procedure including the risks, benefits and alternatives for the proposed anesthesia with the patient or authorized representative who has indicated his/her understanding and acceptance.   Dental Advisory Given  Plan Discussed with: Anesthesiologist, CRNA and Surgeon  Anesthesia Plan Comments:         Anesthesia Quick Evaluation

## 2017-08-21 NOTE — Op Note (Signed)
[facility] Gastroenterology Patient Name: Shaun Odonnell Procedure Date: 08/21/2017 11:24 AM MRN: [id number] Account #: [id number] Date of Birth: 1966-07-14 Admit Type: Outpatient Age: 51 Room: [facility] ENDO ROOM 4 Gender: Male Note Status: Finalized Procedure:            Colonoscopy Indications:          High risk colon cancer surveillance: Personal history                        of colonic polyps Providers:            Manya Silvas, MD Referring MD:         Ocie Cornfield. Ouida Sills MD, MD (Referring MD) Medicines:            Propofol per Anesthesia Complications:        No immediate complications. Procedure:            Pre-Anesthesia Assessment:                       - After reviewing the risks and benefits, the patient                        was deemed in satisfactory condition to undergo the                        procedure.                       After obtaining informed consent, the colonoscope was                        passed under direct vision. Throughout the procedure,                        the patient's blood pressure, pulse, and oxygen                        saturations were monitored continuously. The                        Colonoscope was introduced through the anus and                        advanced to the the cecum, identified by appendiceal                        orifice and ileocecal valve. The colonoscopy was                        performed without difficulty. The patient tolerated the                        procedure well. The quality of the bowel preparation                        was good. Findings:      Ten sessile polyps were found in the rectum, sigmoid colon, descending       colon, transverse colon and ascending colon. The polyps were diminutive       in size. These polyps were removed with a jumbo cold forceps. Resection  and retrieval were complete.      Internal hemorrhoids were found during endoscopy. The hemorrhoids were      small and Grade I (internal hemorrhoids that do not prolapse).      The exam was otherwise without abnormality. Impression:           - Ten diminutive polyps in the rectum, in the sigmoid                        colon, in the descending colon, in the transverse colon                        and in the ascending colon, removed with a jumbo cold                        forceps. Resected and retrieved.                       - Internal hemorrhoids.                       - The examination was otherwise normal. Recommendation:       - Await pathology results. Manya Silvas, MD 08/21/2017 12:08:47 PM This report has been signed electronically. Number of Addenda: 0 Note Initiated On: 08/21/2017 11:24 AM Scope Withdrawal Time: 0 hours 15 minutes 52 seconds  Total Procedure Duration: 0 hours 24 minutes 14 seconds       [facility] ([facility])

## 2017-08-21 NOTE — Anesthesia Postprocedure Evaluation (Signed)
Anesthesia Post Note  Patient: Shaun Odonnell  Procedure(s) Performed: COLONOSCOPY WITH PROPOFOL (N/A )  Patient location during evaluation: Endoscopy Anesthesia Type: General Level of consciousness: awake and alert Pain management: pain level controlled Vital Signs Assessment: post-procedure vital signs reviewed and stable Respiratory status: spontaneous breathing, nonlabored ventilation, respiratory function stable and patient connected to nasal cannula oxygen Cardiovascular status: blood pressure returned to baseline and stable Postop Assessment: no apparent nausea or vomiting Anesthetic complications: no     Last Vitals:  Vitals:   08/21/17 1220 08/21/17 1230  BP: 125/78 126/78  Pulse: 77 68  Resp: 13 15  Temp:    SpO2: 95% 94%    Last Pain:  Vitals:   08/21/17 1211  TempSrc: Tympanic                 Martha Clan

## 2017-08-21 NOTE — H&P (Signed)
   Primary Care Physician:  Kirk Ruths, MD Primary Gastroenterologist:  Dr. Vira Agar  Pre-Procedure History & Physical: HPI:  Shaun Odonnell is a 51 y.o. male is here for an colonoscopy.   Past Medical History:  Diagnosis Date  . ARDS (adult respiratory distress syndrome) ([place])   . Bipolar disorder ([place])   . Borderline diabetes   . COPD (chronic obstructive pulmonary disease) ([place])   . Elevated lipids   . Fatty liver   . Hypertension   . Pancreatitis   . Pre-diabetes   . Pulmonary embolism ([place])   . Schizoaffective disorder ([place])     History reviewed. No pertinent surgical history.  Prior to Admission medications   Medication Sig Start Date End Date Taking? Authorizing Provider  sertraline (ZOLOFT) 100 MG tablet Take 100 mg by mouth daily.   Yes [provider]  simvastatin (ZOCOR) 40 MG tablet Take 40 mg by mouth daily.   Yes [provider]  albuterol (PROVENTIL HFA;VENTOLIN HFA) 108 (90 Base) MCG/ACT inhaler Inhale 2 puffs into the lungs every 6 (six) hours as needed for wheezing or shortness of breath. 09/25/16   Delman Kitten, MD  ALPRAZolam Duanne Moron) 0.25 MG tablet Take 0.5 mg by mouth 3 (three) times daily.     [provider]    Allergies as of 06/12/2017 - Review Complete 09/25/2016  Allergen Reaction Noted  . Klonopin [clonazepam]  06/02/2015  . Morphine and related  06/02/2015  . Stelazine [trifluoperazine]  06/02/2015    History reviewed. No pertinent family history.  Social History   Social History  . Marital status: Single    Spouse name: N/A  . Number of children: N/A  . Years of education: N/A   Occupational History  . Not on file.   Social History Main Topics  . Smoking status: Current Every Day Smoker  . Smokeless tobacco: Never Used  . Alcohol use Yes     Comment: history of, none since February   . Drug use: Unknown  . Sexual activity: Not on file   Other Topics Concern  . Not on file   Social History  Narrative  . No narrative on file    Review of Systems: See HPI, otherwise negative ROS  Physical Exam: BP 125/63   Pulse 78   Temp (!) 97.2 F (36.2 C) (Tympanic)   Resp 17   Ht 5\' 8"  (1.727 m)   Wt 102.1 kg (225 lb)   SpO2 98%   BMI 34.21 kg/m  General:   Alert,  pleasant and cooperative in NAD Head:  Normocephalic and atraumatic. Neck:  Supple; no masses or thyromegaly. Lungs:  Clear throughout to auscultation.    Heart:  Regular rate and rhythm. Abdomen:  Soft, nontender and nondistended. Normal bowel sounds, without guarding, and without rebound.   Neurologic:  Alert and  oriented x4;  grossly normal neurologically.  Impression/Plan: Shaun Odonnell is here for an colonoscopy to be performed for [facility] colon polyps  Risks, benefits, limitations, and alternatives regarding  colonoscopy have been reviewed with the patient.  Questions have been answered.  All parties agreeable.   Gaylyn Cheers, MD  08/21/2017, 11:26 AM

## 2017-08-21 NOTE — Transfer of Care (Signed)
Immediate Anesthesia Transfer of Care Note  Patient: Shaun Odonnell  Procedure(s) Performed: COLONOSCOPY WITH PROPOFOL (N/A )  Patient Location: Endoscopy Unit  Anesthesia Type:General  Level of Consciousness: awake and alert   Airway & Oxygen Therapy: Patient Spontanous Breathing and Patient connected to nasal cannula oxygen  Post-op Assessment: Report given to RN  Post vital signs: Reviewed and stable  Last Vitals:  Vitals:   08/21/17 1039 08/21/17 1211  BP: 125/63 113/77  Pulse: 78 81  Resp: 17 16  Temp: (!) 36.2 C 36.9 C  SpO2: 98% 96%    Last Pain:  Vitals:   08/21/17 1211  TempSrc: Tympanic         Complications: No apparent anesthesia complications

## 2017-08-22 ENCOUNTER — Encounter: Payer: Self-pay | Admitting: Unknown Physician Specialty

## 2017-08-22 LAB — SURGICAL PATHOLOGY

## 2018-03-20 ENCOUNTER — Other Ambulatory Visit: Payer: Self-pay

## 2018-03-20 NOTE — Discharge Instructions (Signed)

## 2018-03-26 ENCOUNTER — Ambulatory Visit: Payer: Medicare Other | Admitting: Anesthesiology

## 2018-03-26 ENCOUNTER — Encounter
Admission: RE | Disposition: A | Discharge status: Home / Self Care | Payer: Self-pay | Source: Ambulatory Visit | Attending: Ophthalmology

## 2018-03-26 ENCOUNTER — Ambulatory Visit
Admission: RE | Admit: 2018-03-26 | Discharge: 2018-03-26 | Disposition: A | Discharge status: Home / Self Care | Payer: Medicare Other | Source: Ambulatory Visit | Attending: Ophthalmology | Admitting: Ophthalmology

## 2018-03-26 DIAGNOSIS — Z79899 Other long term (current) drug therapy: Secondary | ICD-10-CM | POA: Insufficient documentation

## 2018-03-26 DIAGNOSIS — E7800 Pure hypercholesterolemia, unspecified: Secondary | ICD-10-CM | POA: Diagnosis not present

## 2018-03-26 DIAGNOSIS — Z86711 Personal history of pulmonary embolism: Secondary | ICD-10-CM | POA: Insufficient documentation

## 2018-03-26 DIAGNOSIS — F419 Anxiety disorder, unspecified: Secondary | ICD-10-CM | POA: Diagnosis not present

## 2018-03-26 DIAGNOSIS — F209 Schizophrenia, unspecified: Secondary | ICD-10-CM | POA: Diagnosis not present

## 2018-03-26 DIAGNOSIS — F319 Bipolar disorder, unspecified: Secondary | ICD-10-CM | POA: Insufficient documentation

## 2018-03-26 DIAGNOSIS — K760 Fatty (change of) liver, not elsewhere classified: Secondary | ICD-10-CM | POA: Diagnosis not present

## 2018-03-26 DIAGNOSIS — E119 Type 2 diabetes mellitus without complications: Secondary | ICD-10-CM | POA: Insufficient documentation

## 2018-03-26 DIAGNOSIS — J449 Chronic obstructive pulmonary disease, unspecified: Secondary | ICD-10-CM | POA: Insufficient documentation

## 2018-03-26 DIAGNOSIS — H2511 Age-related nuclear cataract, right eye: Secondary | ICD-10-CM | POA: Insufficient documentation

## 2018-03-26 DIAGNOSIS — F17210 Nicotine dependence, cigarettes, uncomplicated: Secondary | ICD-10-CM | POA: Insufficient documentation

## 2018-03-26 HISTORY — DX: Dyspnea, unspecified: R06.00

## 2018-03-26 HISTORY — DX: Pneumonia, unspecified organism: J18.9

## 2018-03-26 HISTORY — DX: Pure hypercholesterolemia, unspecified: E78.00

## 2018-03-26 HISTORY — DX: Major depressive disorder, single episode, unspecified: F32.9

## 2018-03-26 HISTORY — DX: Dizziness and giddiness: R42

## 2018-03-26 HISTORY — PX: CATARACT EXTRACTION W/PHACO: SHX586

## 2018-03-26 HISTORY — DX: Depression, unspecified: F32.A

## 2018-03-26 SURGERY — PHACOEMULSIFICATION, CATARACT, WITH IOL INSERTION
Anesthesia: Monitor Anesthesia Care | Site: Eye | Laterality: Right | Wound class: Clean

## 2018-03-26 MED ORDER — BRIMONIDINE TARTRATE-TIMOLOL 0.2-0.5 % OP SOLN
OPHTHALMIC | Status: DC | PRN
  Administered 2018-03-26: 1 [drp] via OPHTHALMIC

## 2018-03-26 MED ORDER — LACTATED RINGERS IV SOLN: 10.0000 mL/h | INTRAVENOUS | Status: DC

## 2018-03-26 MED ORDER — NA HYALUR & NA CHOND-NA HYALUR 0.4-0.35 ML IO KIT
PACK | INTRAOCULAR | Status: DC | PRN
  Administered 2018-03-26: 1 mL via INTRAOCULAR

## 2018-03-26 MED ORDER — MIDAZOLAM HCL 2 MG/2ML IJ SOLN
INTRAMUSCULAR | Status: DC | PRN
  Administered 2018-03-26: 2 mg via INTRAVENOUS

## 2018-03-26 MED ORDER — DEXMEDETOMIDINE HCL 200 MCG/2ML IV SOLN
INTRAVENOUS | Status: DC | PRN
  Administered 2018-03-26: 4 ug via INTRAVENOUS

## 2018-03-26 MED ORDER — ACETAMINOPHEN 325 MG PO TABS: 325.0000 mg | ORAL_TABLET | ORAL | Status: DC | PRN

## 2018-03-26 MED ORDER — CEFUROXIME OPHTHALMIC INJECTION 1 MG/0.1 ML
INJECTION | OPHTHALMIC | Status: DC | PRN
  Administered 2018-03-26: .3 mL via OPHTHALMIC

## 2018-03-26 MED ORDER — EPINEPHRINE PF 1 MG/ML IJ SOLN
INTRAOCULAR | Status: DC | PRN
  Administered 2018-03-26: 42 mL via OPHTHALMIC

## 2018-03-26 MED ORDER — LIDOCAINE HCL (PF) 2 % IJ SOLN
INTRAOCULAR | Status: DC | PRN
  Administered 2018-03-26: 1 mL via INTRAMUSCULAR

## 2018-03-26 MED ORDER — FENTANYL CITRATE (PF) 100 MCG/2ML IJ SOLN
INTRAMUSCULAR | Status: DC | PRN
  Administered 2018-03-26: 100 ug via INTRAVENOUS

## 2018-03-26 MED ORDER — ONDANSETRON HCL 4 MG/2ML IJ SOLN
4.0000 mg | Freq: Once | INTRAMUSCULAR | Status: DC | PRN
Start: 2018-03-26 — End: 2018-03-26

## 2018-03-26 MED ORDER — ARMC OPHTHALMIC DILATING DROPS
1.0000 "application " | OPHTHALMIC | Status: DC | PRN
  Administered 2018-03-26 (×3): 1 via OPHTHALMIC

## 2018-03-26 MED ORDER — MOXIFLOXACIN HCL 0.5 % OP SOLN
1.0000 [drp] | OPHTHALMIC | Status: DC | PRN
  Administered 2018-03-26 (×3): 1 [drp] via OPHTHALMIC

## 2018-03-26 MED ORDER — ACETAMINOPHEN 160 MG/5ML PO SOLN: 325.0000 mg | ORAL | Status: DC | PRN

## 2018-03-26 SURGICAL SUPPLY — 27 items

## 2018-03-26 NOTE — Anesthesia Procedure Notes (Signed)
Procedure Name: MAC Date/Time: 03/26/2018 7:50 AM Performed by: Janna Arch, CRNA Pre-anesthesia Checklist: Patient identified, Emergency Drugs available, Suction available and Patient being monitored Patient Re-evaluated:Patient Re-evaluated prior to induction Oxygen Delivery Method: Nasal cannula

## 2018-03-26 NOTE — H&P (Signed)
The History and Physical notes are on paper, have been signed, and are to be scanned. The patient remains stable and unchanged from the H&P.   Previous H&P reviewed, patient examined, and there are no changes.  Shaun Odonnell 03/26/2018 7:42 AM

## 2018-03-26 NOTE — Transfer of Care (Signed)
Immediate Anesthesia Transfer of Care Note  Patient: Shaun Odonnell  Procedure(s) Performed: CATARACT EXTRACTION PHACO AND INTRAOCULAR LENS PLACEMENT (IOC) RIGHT BORDERLINE DIABETIC (Right Eye)  Patient Location: PACU  Anesthesia Type: MAC  Level of Consciousness: awake, alert  and patient cooperative  Airway and Oxygen Therapy: Patient Spontanous Breathing and Patient connected to supplemental oxygen  Post-op Assessment: Post-op Vital signs reviewed, Patient's Cardiovascular Status Stable, Respiratory Function Stable, Patent Airway and No signs of Nausea or vomiting  Post-op Vital Signs: Reviewed and stable  Complications: No apparent anesthesia complications

## 2018-03-26 NOTE — Op Note (Signed)
LOCATION:  [place]   PREOPERATIVE DIAGNOSIS:    Nuclear sclerotic cataract right eye. H25.11   POSTOPERATIVE DIAGNOSIS:  Nuclear sclerotic cataract right eye.     PROCEDURE:  Phacoemusification with posterior chamber intraocular lens placement of the right eye   LENS:   Implant Name Type Inv. Item Serial No. Manufacturer Lot No. LRB No. Used  LENS IOL DIOP 21.5 - [id number] Intraocular Lens LENS IOL DIOP 21.5 [id number]  Right 1        ULTRASOUND TIME: 18 % of 0 minutes, 16 seconds.  CDE 2.9   SURGEON:  Wyonia Hough, MD   ANESTHESIA:  Topical with tetracaine drops and 2% Xylocaine jelly, augmented with 1% preservative-free intracameral lidocaine.    COMPLICATIONS:  None.   DESCRIPTION OF PROCEDURE:  The patient was identified in the holding room and transported to the operating room and placed in the supine position under the operating microscope.  The right eye was identified as the operative eye and it was prepped and draped in the usual sterile ophthalmic fashion.   A 1 millimeter clear-corneal paracentesis was made at the 12:00 position.  0.5 ml of preservative-free 1% lidocaine was injected into the anterior chamber. The anterior chamber was filled with Viscoat viscoelastic.  A 2.4 millimeter keratome was used to make a near-clear corneal incision at the 9:00 position.  A curvilinear capsulorrhexis was made with a cystotome and capsulorrhexis forceps.  Balanced salt solution was used to hydrodissect and hydrodelineate the nucleus.   Phacoemulsification was then used in stop and chop fashion to remove the lens nucleus and epinucleus.  The remaining cortex was then removed using the irrigation and aspiration handpiece. Provisc was then placed into the capsular bag to distend it for lens placement.  A lens was then injected into the capsular bag.  The remaining viscoelastic was aspirated.   Wounds were hydrated with balanced salt solution.  The anterior  chamber was inflated to a physiologic pressure with balanced salt solution.  No wound leaks were noted. Cefuroxime 0.1 ml of a 10mg /ml solution was injected into the anterior chamber for a dose of 1 mg of intracameral antibiotic at the completion of the case.   Timolol and Brimonidine drops were applied to the eye.  The patient was taken to the recovery room in stable condition without complications of anesthesia or surgery.   Milka Windholz 03/26/2018, 8:06 AM

## 2018-03-26 NOTE — Anesthesia Postprocedure Evaluation (Signed)
Anesthesia Post Note  Patient: Shaun Odonnell  Procedure(s) Performed: CATARACT EXTRACTION PHACO AND INTRAOCULAR LENS PLACEMENT (IOC) RIGHT BORDERLINE DIABETIC (Right Eye)  Patient location during evaluation: PACU Anesthesia Type: MAC Level of consciousness: awake and alert, oriented and patient cooperative Pain management: pain level controlled Vital Signs Assessment: post-procedure vital signs reviewed and stable Respiratory status: spontaneous breathing, nonlabored ventilation and respiratory function stable Cardiovascular status: blood pressure returned to baseline and stable Postop Assessment: adequate PO intake Anesthetic complications: no    Darrin Nipper

## 2018-03-26 NOTE — Anesthesia Preprocedure Evaluation (Signed)
Anesthesia Evaluation  Patient identified by MRN, date of birth, ID band Patient awake    Reviewed: Allergy & Precautions, NPO status , Patient's Chart, lab work & pertinent test results  History of Anesthesia Complications Negative for: history of anesthetic complications  Airway Mallampati: I  TM Distance: >3 FB Neck ROM: Full    Dental no notable dental hx.    Pulmonary COPD, Current Smoker (1.25 ppd),  Hx ARDS (s/p PE); snoring   Pulmonary exam normal breath sounds clear to auscultation       Cardiovascular Exercise Tolerance: Good negative cardio ROS Normal cardiovascular exam Rhythm:Regular Rate:Normal     Neuro/Psych PSYCHIATRIC DISORDERS Anxiety Depression Bipolar Disorder Schizophrenia negative neurological ROS     GI/Hepatic negative GI ROS, Fatty liver   Endo/Other  diabetes, Type 2Chronic pancreatitis  Renal/GU negative Renal ROS     Musculoskeletal   Abdominal   Peds  Hematology Hx PE 2003   Anesthesia Other Findings   Reproductive/Obstetrics                             Anesthesia Physical Anesthesia Plan  ASA: III  Anesthesia Plan: MAC   Post-op Pain Management:    Induction: Intravenous  PONV Risk Score and Plan: 1 and Midazolam and TIVA  Airway Management Planned: Natural Airway  Additional Equipment:   Intra-op Plan:   Post-operative Plan:   Informed Consent: I have reviewed the patients History and Physical, chart, labs and discussed the procedure including the risks, benefits and alternatives for the proposed anesthesia with the patient or authorized representative who has indicated his/her understanding and acceptance.     Plan Discussed with: CRNA  Anesthesia Plan Comments:         Anesthesia Quick Evaluation

## 2018-03-27 ENCOUNTER — Encounter: Payer: Self-pay | Admitting: Ophthalmology

## 2018-04-18 NOTE — Discharge Instructions (Signed)

## 2018-04-21 ENCOUNTER — Other Ambulatory Visit: Payer: Self-pay

## 2018-04-23 ENCOUNTER — Ambulatory Visit: Payer: Medicare Other | Admitting: Anesthesiology

## 2018-04-23 ENCOUNTER — Encounter
Admission: RE | Disposition: A | Discharge status: Home / Self Care | Payer: Self-pay | Source: Ambulatory Visit | Attending: Ophthalmology

## 2018-04-23 ENCOUNTER — Ambulatory Visit
Admission: RE | Admit: 2018-04-23 | Discharge: 2018-04-23 | Disposition: A | Discharge status: Home / Self Care | Payer: Medicare Other | Source: Ambulatory Visit | Attending: Ophthalmology | Admitting: Ophthalmology

## 2018-04-23 DIAGNOSIS — E1136 Type 2 diabetes mellitus with diabetic cataract: Secondary | ICD-10-CM | POA: Diagnosis not present

## 2018-04-23 DIAGNOSIS — E7800 Pure hypercholesterolemia, unspecified: Secondary | ICD-10-CM | POA: Diagnosis not present

## 2018-04-23 DIAGNOSIS — H2512 Age-related nuclear cataract, left eye: Secondary | ICD-10-CM | POA: Insufficient documentation

## 2018-04-23 DIAGNOSIS — F329 Major depressive disorder, single episode, unspecified: Secondary | ICD-10-CM | POA: Insufficient documentation

## 2018-04-23 DIAGNOSIS — J449 Chronic obstructive pulmonary disease, unspecified: Secondary | ICD-10-CM | POA: Diagnosis not present

## 2018-04-23 DIAGNOSIS — Z79899 Other long term (current) drug therapy: Secondary | ICD-10-CM | POA: Diagnosis not present

## 2018-04-23 DIAGNOSIS — F17200 Nicotine dependence, unspecified, uncomplicated: Secondary | ICD-10-CM | POA: Insufficient documentation

## 2018-04-23 HISTORY — PX: CATARACT EXTRACTION W/PHACO: SHX586

## 2018-04-23 SURGERY — PHACOEMULSIFICATION, CATARACT, WITH IOL INSERTION
Anesthesia: Monitor Anesthesia Care | Laterality: Left | Wound class: Clean

## 2018-04-23 MED ORDER — LIDOCAINE HCL (PF) 2 % IJ SOLN
INTRAMUSCULAR | Status: DC | PRN
  Administered 2018-04-23: 09:00:00

## 2018-04-23 MED ORDER — NA HYALUR & NA CHOND-NA HYALUR 0.4-0.35 ML IO KIT
PACK | INTRAOCULAR | Status: DC | PRN
  Administered 2018-04-23: 1 mL via INTRAOCULAR

## 2018-04-23 MED ORDER — MIDAZOLAM HCL 2 MG/2ML IJ SOLN
INTRAMUSCULAR | Status: DC | PRN
  Administered 2018-04-23: 2 mg via INTRAVENOUS

## 2018-04-23 MED ORDER — ARMC OPHTHALMIC DILATING DROPS
1.0000 "application " | OPHTHALMIC | Status: DC | PRN
  Administered 2018-04-23 (×3): 1 via OPHTHALMIC

## 2018-04-23 MED ORDER — ACETAMINOPHEN 160 MG/5ML PO SOLN: 325.0000 mg | ORAL | Status: DC | PRN

## 2018-04-23 MED ORDER — FENTANYL CITRATE (PF) 100 MCG/2ML IJ SOLN
INTRAMUSCULAR | Status: DC | PRN
  Administered 2018-04-23: 100 ug via INTRAVENOUS

## 2018-04-23 MED ORDER — LACTATED RINGERS IV SOLN: INTRAVENOUS | Status: DC

## 2018-04-23 MED ORDER — BRIMONIDINE TARTRATE-TIMOLOL 0.2-0.5 % OP SOLN
OPHTHALMIC | Status: DC | PRN
  Administered 2018-04-23: 1 [drp] via OPHTHALMIC

## 2018-04-23 MED ORDER — EPINEPHRINE PF 1 MG/ML IJ SOLN
INTRAOCULAR | Status: DC | PRN
  Administered 2018-04-23: 75 mL via OPHTHALMIC

## 2018-04-23 MED ORDER — ACETAMINOPHEN 325 MG PO TABS: 325.0000 mg | ORAL_TABLET | ORAL | Status: DC | PRN

## 2018-04-23 MED ORDER — CEFUROXIME OPHTHALMIC INJECTION 1 MG/0.1 ML
INJECTION | OPHTHALMIC | Status: DC | PRN
  Administered 2018-04-23: 0.1 mL via INTRACAMERAL

## 2018-04-23 MED ORDER — MOXIFLOXACIN HCL 0.5 % OP SOLN
1.0000 [drp] | OPHTHALMIC | Status: DC | PRN
  Administered 2018-04-23 (×3): 1 [drp] via OPHTHALMIC

## 2018-04-23 SURGICAL SUPPLY — 27 items

## 2018-04-23 NOTE — H&P (Signed)
The History and Physical notes are on paper, have been signed, and are to be scanned. The patient remains stable and unchanged from the H&P.   Previous H&P reviewed, patient examined, and there are no changes.  Rozell Kettlewell 04/23/2018 7:33 AM

## 2018-04-23 NOTE — Anesthesia Preprocedure Evaluation (Signed)
Anesthesia Evaluation  Patient identified by MRN, date of birth, ID band Patient awake    Reviewed: Allergy & Precautions, H&P , NPO status , Patient's Chart, lab work & pertinent test results, reviewed documented beta blocker date and time   Airway Mallampati: II  TM Distance: >3 FB Neck ROM: full    Dental no notable dental hx.    Pulmonary neg pulmonary ROS, shortness of breath, COPD,  COPD inhaler, Current Smoker,  Hx of PE and ARDS   Pulmonary exam normal breath sounds clear to auscultation       Cardiovascular Exercise Tolerance: Good negative cardio ROS Normal cardiovascular exam Rhythm:regular Rate:Normal     Neuro/Psych negative neurological ROS  negative psych ROS   GI/Hepatic negative GI ROS, Neg liver ROS,   Endo/Other  negative endocrine ROS  Renal/GU negative Renal ROS  negative genitourinary   Musculoskeletal   Abdominal   Peds  Hematology negative hematology ROS (+)   Anesthesia Other Findings   Reproductive/Obstetrics negative OB ROS                             Anesthesia Physical Anesthesia Plan  ASA: II  Anesthesia Plan: MAC   Post-op Pain Management:    Induction:   PONV Risk Score and Plan:   Airway Management Planned:   Additional Equipment:   Intra-op Plan:   Post-operative Plan:   Informed Consent: I have reviewed the patients History and Physical, chart, labs and discussed the procedure including the risks, benefits and alternatives for the proposed anesthesia with the patient or authorized representative who has indicated his/her understanding and acceptance.   Dental Advisory Given  Plan Discussed with: CRNA and Anesthesiologist  Anesthesia Plan Comments:         Anesthesia Quick Evaluation

## 2018-04-23 NOTE — Transfer of Care (Signed)
Immediate Anesthesia Transfer of Care Note  Patient: Shaun Odonnell  Procedure(s) Performed: CATARACT EXTRACTION PHACO AND INTRAOCULAR LENS PLACEMENT (IOC)  LEFT BORDERLINE DIABETIC (Left )  Patient Location: PACU  Anesthesia Type: MAC  Level of Consciousness: awake, alert  and patient cooperative  Airway and Oxygen Therapy: Patient Spontanous Breathing and Patient connected to supplemental oxygen  Post-op Assessment: Post-op Vital signs reviewed, Patient's Cardiovascular Status Stable, Respiratory Function Stable, Patent Airway and No signs of Nausea or vomiting  Post-op Vital Signs: Reviewed and stable  Complications: No apparent anesthesia complications

## 2018-04-23 NOTE — Op Note (Signed)
OPERATIVE NOTE  SEQUOIA MINCEY [id number] 04/23/2018   PREOPERATIVE DIAGNOSIS:  Nuclear sclerotic cataract left eye. H25.12   POSTOPERATIVE DIAGNOSIS:    Nuclear sclerotic cataract left eye.     PROCEDURE:  Phacoemusification with posterior chamber intraocular lens placement of the left eye   LENS:   Implant Name Type Inv. Item Serial No. Manufacturer Lot No. LRB No. Used  LENS IOL DIOP 22.0 - [id number] Intraocular Lens LENS IOL DIOP 22.0 [id number]  Left 1        ULTRASOUND TIME: 7  % of 0 minutes 14 seconds, CDE 1.1  SURGEON:  Wyonia Hough, MD   ANESTHESIA:  Topical with tetracaine drops and 2% Xylocaine jelly, augmented with 1% preservative-free intracameral lidocaine.    COMPLICATIONS:  None.   DESCRIPTION OF PROCEDURE:  The patient was identified in the holding room and transported to the operating room and placed in the supine position under the operating microscope.  The left eye was identified as the operative eye and it was prepped and draped in the usual sterile ophthalmic fashion.   A 1 millimeter clear-corneal paracentesis was made at the 1:30 position.  0.5 ml of preservative-free 1% lidocaine was injected into the anterior chamber.  The anterior chamber was filled with Viscoat viscoelastic.  A 2.4 millimeter keratome was used to make a near-clear corneal incision at the 10:30 position.  .  A curvilinear capsulorrhexis was made with a cystotome and capsulorrhexis forceps.  Balanced salt solution was used to hydrodissect and hydrodelineate the nucleus.   Phacoemulsification was then used in stop and chop fashion to remove the lens nucleus and epinucleus.  The remaining cortex was then removed using the irrigation and aspiration handpiece. Provisc was then placed into the capsular bag to distend it for lens placement.  A lens was then injected into the capsular bag.  The remaining viscoelastic was aspirated.   Wounds were hydrated with balanced salt  solution.  The anterior chamber was inflated to a physiologic pressure with balanced salt solution.  No wound leaks were noted. Cefuroxime 0.1 ml of a 10mg /ml solution was injected into the anterior chamber for a dose of 1 mg of intracameral antibiotic at the completion of the case.   Timolol and Brimonidine drops were applied to the eye.  The patient was taken to the recovery room in stable condition without complications of anesthesia or surgery.  Meron Bocchino 04/23/2018, 8:25 AM

## 2018-04-23 NOTE — Anesthesia Postprocedure Evaluation (Signed)
Anesthesia Post Note  Patient: Shaun Odonnell  Procedure(s) Performed: CATARACT EXTRACTION PHACO AND INTRAOCULAR LENS PLACEMENT (IOC)  LEFT BORDERLINE DIABETIC (Left )  Patient location during evaluation: PACU Anesthesia Type: MAC Level of consciousness: awake and alert Pain management: pain level controlled Vital Signs Assessment: post-procedure vital signs reviewed and stable Respiratory status: spontaneous breathing, nonlabored ventilation, respiratory function stable and patient connected to nasal cannula oxygen Cardiovascular status: stable and blood pressure returned to baseline Postop Assessment: no apparent nausea or vomiting Anesthetic complications: no    Trecia Rogers

## 2018-04-24 ENCOUNTER — Encounter: Payer: Self-pay | Admitting: Ophthalmology

## 2018-05-12 DIAGNOSIS — R9431 Abnormal electrocardiogram [ECG] [EKG]: Secondary | ICD-10-CM | POA: Insufficient documentation

## 2018-07-02 ENCOUNTER — Other Ambulatory Visit: Payer: Self-pay

## 2018-07-02 ENCOUNTER — Inpatient Hospital Stay
Admission: EM | Admit: 2018-07-02 | Discharge: 2018-07-09 | DRG: 440 | Disposition: A | Discharge status: Home / Self Care | Payer: Medicare Other | Attending: Internal Medicine | Admitting: Internal Medicine

## 2018-07-02 ENCOUNTER — Emergency Department: Payer: Medicare Other

## 2018-07-02 DIAGNOSIS — Z86711 Personal history of pulmonary embolism: Secondary | ICD-10-CM | POA: Diagnosis not present

## 2018-07-02 DIAGNOSIS — E860 Dehydration: Secondary | ICD-10-CM | POA: Diagnosis present

## 2018-07-02 DIAGNOSIS — J449 Chronic obstructive pulmonary disease, unspecified: Secondary | ICD-10-CM | POA: Diagnosis present

## 2018-07-02 DIAGNOSIS — E876 Hypokalemia: Secondary | ICD-10-CM | POA: Diagnosis not present

## 2018-07-02 DIAGNOSIS — R7303 Prediabetes: Secondary | ICD-10-CM | POA: Diagnosis present

## 2018-07-02 DIAGNOSIS — K760 Fatty (change of) liver, not elsewhere classified: Secondary | ICD-10-CM | POA: Diagnosis present

## 2018-07-02 DIAGNOSIS — F319 Bipolar disorder, unspecified: Secondary | ICD-10-CM | POA: Diagnosis present

## 2018-07-02 DIAGNOSIS — E7800 Pure hypercholesterolemia, unspecified: Secondary | ICD-10-CM | POA: Diagnosis present

## 2018-07-02 DIAGNOSIS — Z79899 Other long term (current) drug therapy: Secondary | ICD-10-CM

## 2018-07-02 DIAGNOSIS — K861 Other chronic pancreatitis: Secondary | ICD-10-CM | POA: Diagnosis present

## 2018-07-02 DIAGNOSIS — E785 Hyperlipidemia, unspecified: Secondary | ICD-10-CM | POA: Diagnosis present

## 2018-07-02 DIAGNOSIS — K852 Alcohol induced acute pancreatitis without necrosis or infection: Secondary | ICD-10-CM | POA: Diagnosis present

## 2018-07-02 DIAGNOSIS — F17210 Nicotine dependence, cigarettes, uncomplicated: Secondary | ICD-10-CM | POA: Diagnosis present

## 2018-07-02 DIAGNOSIS — F1010 Alcohol abuse, uncomplicated: Secondary | ICD-10-CM | POA: Diagnosis present

## 2018-07-02 DIAGNOSIS — F259 Schizoaffective disorder, unspecified: Secondary | ICD-10-CM | POA: Diagnosis present

## 2018-07-02 DIAGNOSIS — K859 Acute pancreatitis without necrosis or infection, unspecified: Secondary | ICD-10-CM | POA: Diagnosis present

## 2018-07-02 DIAGNOSIS — K8521 Alcohol induced acute pancreatitis with uninfected necrosis: Secondary | ICD-10-CM | POA: Diagnosis present

## 2018-07-02 DIAGNOSIS — K8511 Biliary acute pancreatitis with uninfected necrosis: Secondary | ICD-10-CM | POA: Diagnosis not present

## 2018-07-02 LAB — CBC WITH DIFFERENTIAL/PLATELET
Basophils Absolute: 0.1 10*3/uL (ref 0–0.1)
Basophils Relative: 0 %
Eosinophils Absolute: 0.1 10*3/uL (ref 0–0.7)
Eosinophils Relative: 0 %
HCT: 53.6 % — ABNORMAL HIGH (ref 40.0–52.0)
HEMOGLOBIN: 18.4 g/dL — AB (ref 13.0–18.0)
LYMPHS ABS: 1.7 10*3/uL (ref 1.0–3.6)
LYMPHS PCT: 10 %
MCH: 32.9 pg (ref 26.0–34.0)
MCHC: 34.3 g/dL (ref 32.0–36.0)
MCV: 95.9 fL (ref 80.0–100.0)
MONO ABS: 1 10*3/uL (ref 0.2–1.0)
MONOS PCT: 6 %
Neutro Abs: 13.6 10*3/uL — ABNORMAL HIGH (ref 1.4–6.5)
Neutrophils Relative %: 84 %
Platelets: 199 10*3/uL (ref 150–440)
RBC: 5.59 MIL/uL (ref 4.40–5.90)
RDW: 14.2 % (ref 11.5–14.5)
WBC: 16.5 10*3/uL — ABNORMAL HIGH (ref 3.8–10.6)

## 2018-07-02 LAB — URINALYSIS, COMPLETE (UACMP) WITH MICROSCOPIC
BACTERIA UA: NONE SEEN
BILIRUBIN URINE: NEGATIVE
Glucose, UA: NEGATIVE mg/dL
Hgb urine dipstick: NEGATIVE
Ketones, ur: 20 mg/dL — AB
LEUKOCYTES UA: NEGATIVE
Nitrite: NEGATIVE
PH: 5 (ref 5.0–8.0)
Protein, ur: NEGATIVE mg/dL

## 2018-07-02 LAB — COMPREHENSIVE METABOLIC PANEL
ALK PHOS: 108 U/L (ref 38–126)
ALT: 115 U/L — ABNORMAL HIGH (ref 0–44)
AST: 79 U/L — ABNORMAL HIGH (ref 15–41)
Albumin: 4.5 g/dL (ref 3.5–5.0)
Anion gap: 16 — ABNORMAL HIGH (ref 5–15)
BILIRUBIN TOTAL: 1.1 mg/dL (ref 0.3–1.2)
BUN: 16 mg/dL (ref 6–20)
CALCIUM: 9.5 mg/dL (ref 8.9–10.3)
CO2: 25 mmol/L (ref 22–32)
Chloride: 95 mmol/L — ABNORMAL LOW (ref 98–111)
Creatinine, Ser: 0.78 mg/dL (ref 0.61–1.24)
GFR calc Af Amer: >60 mL/min (ref >60)
GLUCOSE: 144 mg/dL — AB (ref 70–99)
POTASSIUM: 3.6 mmol/L (ref 3.5–5.1)
Sodium: 136 mmol/L (ref 135–145)
TOTAL PROTEIN: 7.5 g/dL (ref 6.5–8.1)

## 2018-07-02 LAB — LIPASE, BLOOD: LIPASE: 787 U/L — AB (ref 11–51)

## 2018-07-02 LAB — URINE DRUG SCREEN, QUALITATIVE (ARMC ONLY)
AMPHETAMINES, UR SCREEN: NOT DETECTED
Barbiturates, Ur Screen: NOT DETECTED
COCAINE METABOLITE, UR ~~LOC~~: NOT DETECTED
Cannabinoid 50 Ng, Ur ~~LOC~~: NOT DETECTED
MDMA (ECSTASY) UR SCREEN: NOT DETECTED
Methadone Scn, Ur: NOT DETECTED
OPIATE, UR SCREEN: POSITIVE — AB
Phencyclidine (PCP) Ur S: NOT DETECTED
Tricyclic, Ur Screen: NOT DETECTED

## 2018-07-02 LAB — TROPONIN I

## 2018-07-02 LAB — ETHANOL: Alcohol, Ethyl (B): <10 mg/dL (ref <10)

## 2018-07-02 MED ORDER — LORAZEPAM 1 MG PO TABS: 1.0000 mg | ORAL_TABLET | Freq: Four times a day (QID) | ORAL | Status: AC | PRN

## 2018-07-02 MED ORDER — HYDROMORPHONE HCL 1 MG/ML IJ SOLN
0.5000 mg | Freq: Once | INTRAMUSCULAR | Status: AC
  Administered 2018-07-02: 0.5 mg via INTRAVENOUS
  Filled 2018-07-02: qty 1

## 2018-07-02 MED ORDER — LORAZEPAM 2 MG/ML IJ SOLN: 1.0000 mg | Freq: Four times a day (QID) | INTRAMUSCULAR | Status: AC | PRN

## 2018-07-02 MED ORDER — KETOROLAC TROMETHAMINE 30 MG/ML IJ SOLN
30.0000 mg | Freq: Four times a day (QID) | INTRAMUSCULAR | Status: DC | PRN
  Administered 2018-07-02 (×2): 30 mg via INTRAVENOUS
  Filled 2018-07-02 (×2): qty 1

## 2018-07-02 MED ORDER — SERTRALINE HCL 50 MG PO TABS
100.0000 mg | ORAL_TABLET | Freq: Every day | ORAL | Status: DC
  Administered 2018-07-02 – 2018-07-09 (×8): 100 mg via ORAL
  Filled 2018-07-02 (×8): qty 2

## 2018-07-02 MED ORDER — TRAMADOL HCL 50 MG PO TABS
50.0000 mg | ORAL_TABLET | Freq: Four times a day (QID) | ORAL | Status: DC | PRN
  Administered 2018-07-03: 50 mg via ORAL
  Filled 2018-07-02: qty 1

## 2018-07-02 MED ORDER — SODIUM CHLORIDE 0.9 % IV SOLN
Freq: Once | INTRAVENOUS | Status: AC
  Administered 2018-07-02: 10:00:00 via INTRAVENOUS

## 2018-07-02 MED ORDER — ACETAMINOPHEN 650 MG RE SUPP: 650.0000 mg | Freq: Four times a day (QID) | RECTAL | Status: DC | PRN

## 2018-07-02 MED ORDER — HYDROMORPHONE HCL 1 MG/ML IJ SOLN
2.0000 mg | INTRAMUSCULAR | Status: DC | PRN
  Administered 2018-07-02 – 2018-07-03 (×4): 2 mg via INTRAVENOUS
  Filled 2018-07-02 (×4): qty 2

## 2018-07-02 MED ORDER — ALBUTEROL SULFATE (2.5 MG/3ML) 0.083% IN NEBU: 2.5000 mg | INHALATION_SOLUTION | Freq: Four times a day (QID) | RESPIRATORY_TRACT | Status: DC | PRN

## 2018-07-02 MED ORDER — ENOXAPARIN SODIUM 40 MG/0.4ML ~~LOC~~ SOLN
40.0000 mg | SUBCUTANEOUS | Status: DC
  Administered 2018-07-02 – 2018-07-08 (×7): 40 mg via SUBCUTANEOUS
  Filled 2018-07-02 (×7): qty 0.4

## 2018-07-02 MED ORDER — ACETAMINOPHEN 325 MG PO TABS: 650.0000 mg | ORAL_TABLET | Freq: Four times a day (QID) | ORAL | Status: DC | PRN

## 2018-07-02 MED ORDER — ADULT MULTIVITAMIN W/MINERALS CH
1.0000 | ORAL_TABLET | Freq: Every day | ORAL | Status: DC
  Administered 2018-07-02 – 2018-07-08 (×7): 1 via ORAL
  Filled 2018-07-02 (×7): qty 1

## 2018-07-02 MED ORDER — SODIUM CHLORIDE 0.9 % IV SOLN
1000.0000 mL | Freq: Once | INTRAVENOUS | Status: AC
  Administered 2018-07-02: 1000 mL via INTRAVENOUS

## 2018-07-02 MED ORDER — ONDANSETRON HCL 4 MG/2ML IJ SOLN
4.0000 mg | Freq: Four times a day (QID) | INTRAMUSCULAR | Status: DC | PRN
  Administered 2018-07-02 – 2018-07-04 (×3): 4 mg via INTRAVENOUS
  Filled 2018-07-02 (×4): qty 2

## 2018-07-02 MED ORDER — HYDROMORPHONE HCL 1 MG/ML IJ SOLN
1.0000 mg | Freq: Once | INTRAMUSCULAR | Status: AC
  Administered 2018-07-02: 1 mg via INTRAVENOUS
  Filled 2018-07-02: qty 1

## 2018-07-02 MED ORDER — ONDANSETRON HCL 4 MG/2ML IJ SOLN
4.0000 mg | Freq: Once | INTRAMUSCULAR | Status: AC
  Administered 2018-07-02: 4 mg via INTRAVENOUS
  Filled 2018-07-02: qty 2

## 2018-07-02 MED ORDER — OLANZAPINE 7.5 MG PO TABS
15.0000 mg | ORAL_TABLET | Freq: Every day | ORAL | Status: DC
  Administered 2018-07-02 – 2018-07-08 (×7): 15 mg via ORAL
  Filled 2018-07-02 (×9): qty 2

## 2018-07-02 MED ORDER — LORAZEPAM 2 MG PO TABS: 0.0000 mg | ORAL_TABLET | Freq: Four times a day (QID) | ORAL | Status: AC

## 2018-07-02 MED ORDER — THIAMINE HCL 100 MG/ML IJ SOLN
100.0000 mg | Freq: Every day | INTRAMUSCULAR | Status: DC
  Filled 2018-07-02 (×8): qty 1

## 2018-07-02 MED ORDER — VITAMIN B-1 100 MG PO TABS
100.0000 mg | ORAL_TABLET | Freq: Every day | ORAL | Status: DC
  Administered 2018-07-02 – 2018-07-08 (×7): 100 mg via ORAL
  Filled 2018-07-02 (×8): qty 1

## 2018-07-02 MED ORDER — HYDROMORPHONE HCL 1 MG/ML IJ SOLN
1.0000 mg | INTRAMUSCULAR | Status: DC | PRN
  Administered 2018-07-02: 1 mg via INTRAVENOUS
  Filled 2018-07-02: qty 1

## 2018-07-02 MED ORDER — SODIUM CHLORIDE 0.9 % IV SOLN
INTRAVENOUS | Status: DC
  Administered 2018-07-02 – 2018-07-09 (×18): via INTRAVENOUS

## 2018-07-02 MED ORDER — ALBUTEROL SULFATE HFA 108 (90 BASE) MCG/ACT IN AERS: 2.0000 | INHALATION_SPRAY | Freq: Four times a day (QID) | RESPIRATORY_TRACT | Status: DC | PRN

## 2018-07-02 MED ORDER — IOPAMIDOL (ISOVUE-300) INJECTION 61%
100.0000 mL | Freq: Once | INTRAVENOUS | Status: AC | PRN
  Administered 2018-07-02: 100 mL via INTRAVENOUS

## 2018-07-02 MED ORDER — ALPRAZOLAM 0.5 MG PO TABS
0.5000 mg | ORAL_TABLET | Freq: Three times a day (TID) | ORAL | Status: DC
  Administered 2018-07-02 – 2018-07-09 (×22): 0.5 mg via ORAL
  Filled 2018-07-02 (×22): qty 1

## 2018-07-02 MED ORDER — LORAZEPAM 2 MG PO TABS: 0.0000 mg | ORAL_TABLET | Freq: Two times a day (BID) | ORAL | Status: AC

## 2018-07-02 MED ORDER — FOLIC ACID 1 MG PO TABS
1.0000 mg | ORAL_TABLET | Freq: Every day | ORAL | Status: DC
  Administered 2018-07-02 – 2018-07-08 (×7): 1 mg via ORAL
  Filled 2018-07-02 (×8): qty 1

## 2018-07-02 NOTE — H&P (Signed)
[place] [facility] [place] NAME: Shaun Odonnell    MR#:  [id number]  DATE OF BIRTH:  06/18/1966  DATE OF ADMISSION:  07/02/2018  PRIMARY CARE PHYSICIAN: Kirk Ruths, MD   REQUESTING/REFERRING PHYSICIAN: dr Jimmye Norman  CHIEF COMPLAINT:   Abdominal pain HISTORY OF PRESENT ILLNESS:  Shaun Odonnell  is a 52 y.o. male with a known history of Etoh abuse, COPD and HLD who presents to the ED with chief complaint as stated above. He reports epigastric abdominal pain rating to his back associated with some nausea and one episode of vomiting.  Patient reports that the abdominal pain started yesterday.  At its worse it is a 8 out of 10.  Currently with Dilaudid it has improved.  No other associated factors.  Eating makes abdominal pain worse.  Patient denies fever, diarrhea, constipation or shortness of breath. Patient drinks 2-3 beers 3-4 times a week.  His last alcoholic drink was over the weekend.  He has not gone through EtOH withdrawal in the past 2 years.   Patient expresses anxiety because he reports he had pancreatitis in 2002 or 2003 and at that time he had ARDS. PAST MEDICAL HISTORY:   Past Medical History:  Diagnosis Date  . ARDS (adult respiratory distress syndrome) ([place])   . ARDS (adult respiratory distress syndrome) ([place])   . Bipolar disorder ([place])   . Borderline diabetes   . COPD (chronic obstructive pulmonary disease) ([facility])    chronic cough and wheezing  . Depression   . Dizziness    medication related  . Dyspnea    with exertion  . Elevated lipids   . Fatty liver   . Hypercholesterolemia   . Pancreatitis   . Pneumonia    in past  . Pneumonia    in past  . Pre-diabetes    diet controlled  . Pulmonary embolism ([place])   . Schizoaffective disorder ([place])     PAST SURGICAL HISTORY:   Past Surgical History:  Procedure Laterality Date  . CATARACT EXTRACTION W/PHACO Right 03/26/2018   Procedure: CATARACT EXTRACTION PHACO AND  INTRAOCULAR LENS PLACEMENT ([place]) RIGHT BORDERLINE DIABETIC;  Surgeon: Leandrew Koyanagi, MD;  Location: [facility];  Service: Ophthalmology;  Laterality: Right;  . CATARACT EXTRACTION W/PHACO Left 04/23/2018   Procedure: CATARACT EXTRACTION PHACO AND INTRAOCULAR LENS PLACEMENT ([place])  LEFT BORDERLINE DIABETIC;  Surgeon: Leandrew Koyanagi, MD;  Location: [facility];  Service: Ophthalmology;  Laterality: Left;  . COLONOSCOPY WITH PROPOFOL N/A 08/21/2017   Procedure: COLONOSCOPY WITH PROPOFOL;  Surgeon: Manya Silvas, MD;  Location: [facility] ENDOSCOPY;  Service: Endoscopy;  Laterality: N/A;  . HERNIA REPAIR      SOCIAL HISTORY:   Social History   Tobacco Use  . Smoking status: Current Every Day Smoker    Packs/day: 1.00    Years: 30.00    Pack years: 30.00    Types: Cigarettes  . Smokeless tobacco: Former Systems developer    Types: Snuff  Substance Use Topics  . Alcohol use: Yes    Alcohol/week: 4.0 standard drinks    Types: 4 Cans of beer per week    Comment: history of, none since February     FAMILY HISTORY:  History reviewed. No pertinent family history.  DRUG ALLERGIES:   Allergies  Allergen Reactions  . Klonopin [Clonazepam]   . Morphine And Related   . Pimozide   . Stelazine [Trifluoperazine]     REVIEW OF SYSTEMS:   Review of  Systems  Constitutional: Negative.  Negative for chills, fever and malaise/fatigue.  HENT: Negative.  Negative for ear discharge, ear pain, hearing loss, nosebleeds and sore throat.   Eyes: Negative.  Negative for blurred vision and pain.  Respiratory: Negative.  Negative for cough, hemoptysis, shortness of breath and wheezing.   Cardiovascular: Negative.  Negative for chest pain, palpitations and leg swelling.  Gastrointestinal: Positive for abdominal pain, nausea and vomiting. Negative for blood in stool and diarrhea.  Genitourinary: Negative.  Negative for dysuria.  Musculoskeletal: Negative.  Negative for back pain.  Skin:  Negative.   Neurological: Negative for dizziness, tremors, speech change, focal weakness, seizures and headaches.  Endo/Heme/Allergies: Negative.  Does not bruise/bleed easily.  Psychiatric/Behavioral: Negative.  Negative for depression, hallucinations and suicidal ideas.    MEDICATIONS AT HOME:   Prior to Admission medications   Medication Sig Start Date End Date Taking? Authorizing Provider  albuterol (PROVENTIL HFA;VENTOLIN HFA) 108 (90 Base) MCG/ACT inhaler Inhale 2 puffs into the lungs every 6 (six) hours as needed for wheezing or shortness of breath. 09/25/16  Yes Delman Kitten, MD  ALPRAZolam Duanne Moron) 0.5 MG tablet Take 0.5 mg by mouth 3 (three) times daily. 06/26/18  Yes [provider]  OLANZapine (ZYPREXA) 15 MG tablet Take 15 mg by mouth at bedtime.   Yes [provider]  sertraline (ZOLOFT) 100 MG tablet Take 100 mg by mouth daily. am   Yes [provider]  simvastatin (ZOCOR) 40 MG tablet Take 40 mg by mouth at bedtime.    Yes [provider]      VITAL SIGNS:  Blood pressure (!) 167/86, pulse 73, temperature 98.1 F (36.7 C), temperature source Oral, resp. rate 18, height 5\' 8"  (1.727 m), weight 102.1 kg, SpO2 98 %.  PHYSICAL EXAMINATION:   Physical Exam  Constitutional: He is oriented to person, place, and time. No distress.  HENT:  Head: Normocephalic.  Eyes: No scleral icterus.  Neck: Normal range of motion. Neck supple. No JVD present. No tracheal deviation present.  Cardiovascular: Normal rate, regular rhythm and normal heart sounds. Exam reveals no gallop and no friction rub.  No murmur heard. Pulmonary/Chest: Effort normal and breath sounds normal. No respiratory distress. He has no wheezes. He has no rales. He exhibits no tenderness.  Abdominal: Soft. Bowel sounds are normal. He exhibits no distension and no mass. There is tenderness in the epigastric area. There is no rebound and no guarding.  Musculoskeletal: Normal range of  motion. He exhibits no edema.  Neurological: He is alert and oriented to person, place, and time.  Skin: Skin is warm. No rash noted. No erythema.  Psychiatric: Judgment normal.      LABORATORY PANEL:   CBC Recent Labs  Lab 07/02/18 0754  WBC 16.5*  HGB 18.4*  HCT 53.6*  PLT 199   ------------------------------------------------------------------------------------------------------------------  Chemistries  Recent Labs  Lab 07/02/18 0754  NA 136  K 3.6  CL 95*  CO2 25  GLUCOSE 144*  BUN 16  CREATININE 0.78  CALCIUM 9.5  AST 79*  ALT 115*  ALKPHOS 108  BILITOT 1.1   ------------------------------------------------------------------------------------------------------------------  Cardiac Enzymes Recent Labs  Lab 07/02/18 0754  TROPONINI <0.03   ------------------------------------------------------------------------------------------------------------------  RADIOLOGY:  Dg Abd Acute W/chest  Result Date: 07/02/2018 CLINICAL DATA:  Abdominal pain EXAM: DG ABDOMEN ACUTE W/ 1V CHEST COMPARISON:  Chest CT 09/25/2016 FINDINGS: Normal heart size and mediastinal contours. Mild scarring at the left base no acute infiltrate or edema. No effusion or  pneumothorax. No acute osseous findings. No concerning mass effect, gas collection, or calcification. Normal bowel gas pattern. IMPRESSION: Negative abdominal radiographs.  No acute cardiopulmonary disease. Electronically Signed   By: Monte Fantasia M.D.   On: 07/02/2018 08:09    EKG:   Orders placed or performed during the hospital encounter of 09/25/16  . EKG 12-Lead  . EKG 12-Lead  . ED EKG  . ED EKG  . EKG    IMPRESSION AND PLAN:   52 y/o m ale with EToh abuse who presents to the ED with abdominal pain.  1. Acute pancreatitis causing his current abdominal pain which is liklely EtOh induced: Follow up on CT ABDOMEN ordered by ED MD follow up to evaluate for possible gallstone or other etiology of  pancreatitis. NPO with aggressive IV hydration until pain improves Follow CBC and CMP Elevated WBC/Hgb /hct on today's labs due to hemoconcentration/dehydration   2. Etoh abuse: CIWA protocol ordered  3. HLD: Hold statin due to slightly elevated LFTS.  4.  Transaminitis maybe due to EtOh abuse but will r/o gallstone pathology  5. COPD stable without signs of exacerbation  6. BPAD: Continue Zyprexa, Zoloft, and Xanax  7. Tobacco dependence: Patient is encouraged to quit smoking. Counseling was provided for 4 minutes.  All the records are reviewed and case discussed with ED provider. Management plans discussed with the patient and he is in agreement  CODE STATUS: full  TOTAL TIME TAKING CARE OF THIS PATIENT: 46 minutes.    Taym Twist M.D on 07/02/2018 at 10:04 AM  Between 7am to 6pm - Pager - [phone]  After 6pm go to [email or web address] - password [email or web address] [facility]  Office  [phone]  CC: Primary care physician; Kirk Ruths, MD

## 2018-07-02 NOTE — ED Provider Notes (Signed)
[facility] Emergency Department Provider Note       Time seen: ----------------------------------------- 7:19 AM on 07/02/2018 -----------------------------------------   I have reviewed the triage vital signs and the nursing notes.  HISTORY   Chief Complaint Abdominal Pain    HPI Shaun Odonnell is a 52 y.o. male with a history of bipolar disorder, COPD, hyperlipidemia, pancreatitis, pneumonia, schizoaffective disorder who presents to the ED for diffuse upper abdominal pain.  Patient has had several days of diffuse upper abdominal pain which feels similarly to when he had pancreatitis years ago.  He has been vomiting up yellow bile, has had occasional diarrhea.  Nothing is made his pain better.  He has not been able to tolerate anything by mouth.  Past Medical History:  Diagnosis Date  . ARDS (adult respiratory distress syndrome) ([place])   . ARDS (adult respiratory distress syndrome) ([place])   . Bipolar disorder ([place])   . Borderline diabetes   . COPD (chronic obstructive pulmonary disease) (H[facility])    chronic cough and wheezing  . Depression   . Dizziness    medication related  . Dyspnea    with exertion  . Elevated lipids   . Fatty liver   . Hypercholesterolemia   . Pancreatitis   . Pneumonia    in past  . Pneumonia    in past  . Pre-diabetes    diet controlled  . Pulmonary embolism ([place])   . Schizoaffective disorder ([place])     There are no active problems to display for this patient.   Past Surgical History:  Procedure Laterality Date  . CATARACT EXTRACTION W/PHACO Right 03/26/2018   Procedure: CATARACT EXTRACTION PHACO AND INTRAOCULAR LENS PLACEMENT ([place]) RIGHT BORDERLINE DIABETIC;  Surgeon: Leandrew Koyanagi, MD;  Location: [facility];  Service: Ophthalmology;  Laterality: Right;  . CATARACT EXTRACTION W/PHACO Left 04/23/2018   Procedure: CATARACT EXTRACTION PHACO AND INTRAOCULAR LENS PLACEMENT ([place])  LEFT BORDERLINE DIABETIC;   Surgeon: Leandrew Koyanagi, MD;  Location: [facility];  Service: Ophthalmology;  Laterality: Left;  . COLONOSCOPY WITH PROPOFOL N/A 08/21/2017   Procedure: COLONOSCOPY WITH PROPOFOL;  Surgeon: Manya Silvas, MD;  Location: [facility] ENDOSCOPY;  Service: Endoscopy;  Laterality: N/A;  . HERNIA REPAIR      Allergies Klonopin [clonazepam]; Morphine and related; Pimozide; and Stelazine [trifluoperazine]  Social History Social History   Tobacco Use  . Smoking status: Current Every Day Smoker    Packs/day: 1.00    Years: 30.00    Pack years: 30.00    Types: Cigarettes  . Smokeless tobacco: Former Systems developer    Types: Snuff  Substance Use Topics  . Alcohol use: Yes    Alcohol/week: 4.0 standard drinks    Types: 4 Cans of beer per week    Comment: history of, none since February   . Drug use: Not Currently   Review of Systems Constitutional: Negative for fever. Cardiovascular: Negative for chest pain. Respiratory: Negative for shortness of breath. Gastrointestinal: Positive for abdominal pain, vomiting and diarrhea Musculoskeletal: Negative for back pain. Skin: Negative for rash. Neurological: Negative for headaches, focal weakness or numbness.  All systems negative/normal/unremarkable except as stated in the HPI  ____________________________________________   PHYSICAL EXAM:  VITAL SIGNS: ED Triage Vitals  Enc Vitals Group     BP      Pulse      Resp      Temp      Temp src      SpO2  Weight      Height      Head Circumference      Peak Flow      Pain Score      Pain Loc      Pain Edu?      Excl. in [place]?    Constitutional: Alert and oriented.  Mild distress from pain Eyes: Conjunctivae are normal. Normal extraocular movements. ENT   Head: Normocephalic and atraumatic.   Nose: No congestion/rhinnorhea.   Mouth/Throat: Mucous membranes are moist.   Neck: No stridor. Cardiovascular: Normal rate, regular rhythm. No murmurs, rubs, or  gallops. Respiratory: Normal respiratory effort without tachypnea nor retractions. Breath sounds are clear and equal bilaterally. No wheezes/rales/rhonchi. Gastrointestinal: Distended, questionable ascites, normal bowel sounds. Musculoskeletal: Nontender with normal range of motion in extremities. No lower extremity tenderness nor edema. Neurologic:  Normal speech and language. No gross focal neurologic deficits are appreciated.  Skin:  Skin is warm, dry and intact. No rash noted. Psychiatric: Mood and affect are normal. Speech and behavior are normal.  ____________________________________________  ED COURSE:  As part of my medical decision making, I reviewed the following data within the [place] History obtained from family if available, nursing notes, old chart and ekg, as well as notes from prior ED visits. Patient presented for abdominal pain with vomiting and diarrhea, we will assess with labs and imaging as indicated at this time. Clinical Course as of Jul 02 938  Wed Jul 02, 2018  0929 Lipase(!): 787 [JW]    Clinical Course User Index [JW] Earleen Newport, MD   Procedures ____________________________________________   LABS (pertinent positives/negatives)  Labs Reviewed  CBC WITH DIFFERENTIAL/PLATELET - Abnormal; Notable for the following components:      Result Value   WBC 16.5 (*)    Hemoglobin 18.4 (*)    HCT 53.6 (*)    Neutro Abs 13.6 (*)    All other components within normal limits  COMPREHENSIVE METABOLIC PANEL - Abnormal; Notable for the following components:   Chloride 95 (*)    Glucose, Bld 144 (*)    AST 79 (*)    ALT 115 (*)    Anion gap 16 (*)    All other components within normal limits  LIPASE, BLOOD - Abnormal; Notable for the following components:   Lipase 787 (*)    All other components within normal limits  ETHANOL  TROPONIN I  URINE DRUG SCREEN, QUALITATIVE ([facility] ONLY)  URINALYSIS, COMPLETE (UACMP) WITH MICROSCOPIC     RADIOLOGY Images were viewed by me  Acute abdominal series IMPRESSION: Negative abdominal radiographs.  No acute cardiopulmonary disease. ____________________________________________  DIFFERENTIAL DIAGNOSIS   Pancreatitis, obstruction, gastroenteritis, dehydration, peptic ulcer disease  FINAL ASSESSMENT AND PLAN  Abdominal pain, acute pancreatitis, dehydration   Plan: The patient had presented for abdominal pain with vomiting. Patient's labs did reveal leukocytosis and an elevated lipase most likely consistent with alcohol induced pancreatitis. Patient's imaging not reveal any acute process.  Patient states he typically will have 2-3 beers as he is cooking dinner but this is only 3 or 4 days a week.  I doubt this is gallstone related but he may need an ultrasound at some point.  He has received multiple doses of Dilaudid here with only some improvement in his pain.  I will discuss with the hospitalist for admission.   Laurence Aly, MD   Note: This note was generated in part or whole with voice recognition software. Voice recognition is  usually quite accurate but there are transcription errors that can and very often do occur. I apologize for any typographical errors that were not detected and corrected.     Earleen Newport, MD 07/02/18 [phone]

## 2018-07-02 NOTE — ED Triage Notes (Signed)
Pt arrives via ems from home with complaints of abdominal pain. EMS states abd pain starting yesterday with NVD. Pt a&o x 4, NAD noted on arrival. Pt states unable to get comfortable and continues to reposition self in bed.

## 2018-07-03 LAB — COMPREHENSIVE METABOLIC PANEL
ALBUMIN: 3.6 g/dL (ref 3.5–5.0)
ALK PHOS: 84 U/L (ref 38–126)
ALT: 68 U/L — ABNORMAL HIGH (ref 0–44)
ANION GAP: 8 (ref 5–15)
AST: 45 U/L — AB (ref 15–41)
BILIRUBIN TOTAL: 1.1 mg/dL (ref 0.3–1.2)
BUN: 16 mg/dL (ref 6–20)
CO2: 22 mmol/L (ref 22–32)
Calcium: 8.1 mg/dL — ABNORMAL LOW (ref 8.9–10.3)
Chloride: 105 mmol/L (ref 98–111)
Creatinine, Ser: 0.71 mg/dL (ref 0.61–1.24)
GFR calc Af Amer: >60 mL/min (ref >60)
GFR calc non Af Amer: >60 mL/min (ref >60)
GLUCOSE: 194 mg/dL — AB (ref 70–99)
POTASSIUM: 4.8 mmol/L (ref 3.5–5.1)
SODIUM: 135 mmol/L (ref 135–145)
TOTAL PROTEIN: 6.3 g/dL — AB (ref 6.5–8.1)

## 2018-07-03 LAB — CBC
HEMATOCRIT: 53.9 % — AB (ref 40.0–52.0)
HEMOGLOBIN: 18.4 g/dL — AB (ref 13.0–18.0)
MCH: 33.5 pg (ref 26.0–34.0)
MCHC: 34.1 g/dL (ref 32.0–36.0)
MCV: 98.3 fL (ref 80.0–100.0)
Platelets: 139 10*3/uL — ABNORMAL LOW (ref 150–440)
RBC: 5.48 MIL/uL (ref 4.40–5.90)
RDW: 14.8 % — ABNORMAL HIGH (ref 11.5–14.5)
WBC: 16 10*3/uL — ABNORMAL HIGH (ref 3.8–10.6)

## 2018-07-03 LAB — LIPASE, BLOOD: Lipase: 703 U/L — ABNORMAL HIGH (ref 11–51)

## 2018-07-03 MED ORDER — OXYCODONE-ACETAMINOPHEN 5-325 MG PO TABS
1.0000 | ORAL_TABLET | ORAL | Status: DC | PRN
  Administered 2018-07-03 – 2018-07-07 (×17): 2 via ORAL
  Filled 2018-07-03 (×19): qty 2

## 2018-07-03 MED ORDER — HYDROMORPHONE HCL 1 MG/ML IJ SOLN
1.0000 mg | INTRAMUSCULAR | Status: DC | PRN
  Administered 2018-07-03: 08:00:00 1 mg via INTRAVENOUS
  Filled 2018-07-03: qty 1

## 2018-07-03 MED ORDER — HYDROMORPHONE HCL 1 MG/ML IJ SOLN
1.0000 mg | INTRAMUSCULAR | Status: DC | PRN
  Administered 2018-07-03 – 2018-07-05 (×14): 1 mg via INTRAVENOUS
  Filled 2018-07-03 (×14): qty 1

## 2018-07-03 NOTE — Progress Notes (Signed)
Pt with O2 sat 89% on room air.  Placed pt on 1 Liter oxygen [place].  Discussed with Dr. Jodell Cipro and Dilaudid changed to 1 mg every 3 hours. Dorna Bloom RN

## 2018-07-03 NOTE — Progress Notes (Signed)
[facility] [place] NAME: Triton Heidrich    MR#:  [id number]  DATE OF BIRTH:  [birth year]  SUBJECTIVE:  CHIEF COMPLAINT:   Chief Complaint  Patient presents with  . Abdominal Pain   Continues to have significant abdominal pain.  Nausea.  Vomiting earlier in the day.  N.p.o. Afebrile.  REVIEW OF SYSTEMS:    Review of Systems  Constitutional: Positive for malaise/fatigue. Negative for chills and fever.  HENT: Negative for sore throat.   Eyes: Negative for blurred vision, double vision and pain.  Respiratory: Negative for cough, hemoptysis, shortness of breath and wheezing.   Cardiovascular: Negative for chest pain, palpitations, orthopnea and leg swelling.  Gastrointestinal: Positive for abdominal pain, nausea and vomiting. Negative for constipation, diarrhea and heartburn.  Genitourinary: Negative for dysuria and hematuria.  Musculoskeletal: Negative for back pain and joint pain.  Skin: Negative for rash.  Neurological: Negative for sensory change, speech change, focal weakness and headaches.  Endo/Heme/Allergies: Does not bruise/bleed easily.  Psychiatric/Behavioral: Negative for depression. The patient is nervous/anxious.     DRUG ALLERGIES:   Allergies  Allergen Reactions  . Klonopin [Clonazepam]   . Morphine And Related   . Pimozide   . Stelazine [Trifluoperazine]     VITALS:  Blood pressure (!) 153/89, pulse (!) 110, temperature 97.9 F (36.6 C), temperature source Oral, resp. rate 14, height 5\' 8"  (1.727 m), weight 102.1 kg, SpO2 92 %.  PHYSICAL EXAMINATION:   Physical Exam  GENERAL:  52 y.o.-year-old patient lying in the bed with no acute distress.  EYES: Pupils equal, round, reactive to light and accommodation. No scleral icterus. Extraocular muscles intact.  HEENT: Head atraumatic, normocephalic. Oropharynx and nasopharynx clear.  NECK:  Supple, no jugular venous distention. No thyroid enlargement, no tenderness.   LUNGS: Normal breath sounds bilaterally, no wheezing, rales, rhonchi. No use of accessory muscles of respiration.  CARDIOVASCULAR: S1, S2 normal. No murmurs, rubs, or gallops.  ABDOMEN: Soft, diffusely tender, nondistended. Bowel sounds present. No organomegaly or mass.  EXTREMITIES: No cyanosis, clubbing or edema b/l.    NEUROLOGIC: Cranial nerves II through XII are intact. No focal Motor or sensory deficits b/l.   PSYCHIATRIC: The patient is alert and oriented x 3.  SKIN: No obvious rash, lesion, or ulcer.   LABORATORY PANEL:   CBC Recent Labs  Lab 07/03/18 0349  WBC 16.0*  HGB 18.4*  HCT 53.9*  PLT 139*   ------------------------------------------------------------------------------------------------------------------ Chemistries  Recent Labs  Lab 07/03/18 0349  NA 135  K 4.8  CL 105  CO2 22  GLUCOSE 194*  BUN 16  CREATININE 0.71  CALCIUM 8.1*  AST 45*  ALT 68*  ALKPHOS 84  BILITOT 1.1   ------------------------------------------------------------------------------------------------------------------  Cardiac Enzymes Recent Labs  Lab 07/02/18 0754  TROPONINI <0.03   ------------------------------------------------------------------------------------------------------------------  RADIOLOGY:  Ct Abdomen Pelvis W Contrast  Result Date: 07/02/2018 CLINICAL DATA:  52 year old male with a history of upper abdominal pain and vomiting EXAM: CT ABDOMEN AND PELVIS WITH CONTRAST TECHNIQUE: Multidetector CT imaging of the abdomen and pelvis was performed using the standard protocol following bolus administration of intravenous contrast. CONTRAST:  115mL ISOVUE-300 IOPAMIDOL (ISOVUE-300) INJECTION 61% COMPARISON:  CT 06/05/2014, MR 06/07/2014 FINDINGS: Lower chest: Atelectasis/scarring at the lung bases. Hepatobiliary: Cranial caudal span of the liver measures greater than 21 cm. Diffusely decreased attenuation, similar to prior. Hyperdense material within the gallbladder  lumen without pericholecystic fluid or inflammatory changes. Pancreas: Edematous appearance pancreatic parenchyma with circumferential  inflammatory changes/edema of the peripancreatic fat. No focal fluid collection or cystic changes. The splenic vein appears patent. Portal vein is patent. Uniform enhancement/attenuation of pancreatic parenchyma with no focal hypoenhancement identified. Spleen: Unremarkable spleen Adrenals/Urinary Tract: Unremarkable appearance of the adrenal glands. No evidence of hydronephrosis of the right or left kidney. No nephrolithiasis. Unremarkable course of the bilateral ureters. Unremarkable appearance of the urinary bladder. Stomach/Bowel: Unremarkable appearance of stomach. Unremarkable small bowel. Appendix is not visualized, however, no inflammatory changes are present adjacent to the cecum to indicate an appendicitis. Unremarkable appearance of colon. Vascular/Lymphatic: Atherosclerotic changes of the abdominal aorta and bilateral iliac arteries. No aneurysm. No arterial occlusion identified. Proximal femoral arteries appear patent. Reproductive: Unremarkable pelvic structures Other: Fat containing umbilical hernia. Musculoskeletal: No acute displaced fracture. Mild degenerative changes. IMPRESSION: Acute pancreatitis, with no complicating features identified. Liver steatosis and hepatomegaly. Hyperdense material within the gallbladder lumen, likely microlithiasis/sludge, with no CT evidence of acute cholecystitis. Electronically Signed   By: Corrie Mckusick D.O.   On: 07/02/2018 10:53   Dg Abd Acute W/chest  Result Date: 07/02/2018 CLINICAL DATA:  Abdominal pain EXAM: DG ABDOMEN ACUTE W/ 1V CHEST COMPARISON:  Chest CT 09/25/2016 FINDINGS: Normal heart size and mediastinal contours. Mild scarring at the left base no acute infiltrate or edema. No effusion or pneumothorax. No acute osseous findings. No concerning mass effect, gas collection, or calcification. Normal bowel gas pattern.  IMPRESSION: Negative abdominal radiographs.  No acute cardiopulmonary disease. Electronically Signed   By: Monte Fantasia M.D.   On: 07/02/2018 08:09     ASSESSMENT AND PLAN:   52 y/o m ale with EToh abuse who presents to the ED with abdominal pain  * Acute pancreatitis due to alcohol CT abdomen showing pancreatitis but no gallstones. Lipase continues to be elevated.  N.p.o. except medications.  Added oral pain medications.  Discussed with patient regarding his pain, treatment plan and prognosis.  Counseled to quit alcohol.  * ETOH abuse: CIWA protocol ordered  * COPD stable without signs of exacerbation  * BPAD: Continue Zyprexa, Zoloft, and Xanax  * Tobacco dependence Counseled to quit on admission  All the records are reviewed and case discussed with Care Management/Social Worker Management plans discussed with the patient, family and they are in agreement.  CODE STATUS: FULL CODE  DVT Prophylaxis: SCDs  TOTAL TIME TAKING CARE OF THIS PATIENT: 35 minutes.   POSSIBLE D/C IN 1-2 DAYS, DEPENDING ON CLINICAL CONDITION.  Leia Alf Mylan Schwarz M.D on 07/03/2018 at 12:53 PM  Between 7am to 6pm - Pager - [phone]  After 6pm go to [email or web address] - password [email or web address] [facility]  Office  [phone]  CC: Primary care physician; Kirk Ruths, MD  Note: This dictation was prepared with Dragon dictation along with smaller phrase technology. Any transcriptional errors that result from this process are unintentional.

## 2018-07-03 NOTE — Progress Notes (Signed)
Pt awakened by IV alarm.  Requesting pain medication.  Suggested pt take Ultram instead of IV Dilaudid but pt insists on 2 mg of Dilaudid IV.  Educated pt on the need to alternate pain medications because he will not be going home on Dilaudid and he does not want to go through withdrawals at discharge. Pt not receptive to education.  Earlier in evening pt's brother took this nurse aside and said that pt has had addiction to xanax and other medications in the past and to "not give him more medication even if he begs for it". Pt is asking to eat and drink.  Explained to pt that he will remain NPO as long as he has a pain level of 8/10 and taking IV Dilaudid around the clock. Dorna Bloom RN

## 2018-07-03 NOTE — Plan of Care (Signed)

## 2018-07-04 LAB — COMPREHENSIVE METABOLIC PANEL
ALT: 38 U/L (ref 0–44)
AST: 38 U/L (ref 15–41)
Albumin: 3 g/dL — ABNORMAL LOW (ref 3.5–5.0)
Alkaline Phosphatase: 69 U/L (ref 38–126)
Anion gap: 5 (ref 5–15)
BUN: 12 mg/dL (ref 6–20)
CO2: 26 mmol/L (ref 22–32)
CREATININE: 0.63 mg/dL (ref 0.61–1.24)
Calcium: 7.6 mg/dL — ABNORMAL LOW (ref 8.9–10.3)
Chloride: 101 mmol/L (ref 98–111)
Glucose, Bld: 178 mg/dL — ABNORMAL HIGH (ref 70–99)
Potassium: 3.8 mmol/L (ref 3.5–5.1)
SODIUM: 132 mmol/L — AB (ref 135–145)
Total Bilirubin: 1.3 mg/dL — ABNORMAL HIGH (ref 0.3–1.2)
Total Protein: 5.6 g/dL — ABNORMAL LOW (ref 6.5–8.1)

## 2018-07-04 LAB — HIV ANTIBODY (ROUTINE TESTING W REFLEX): HIV Screen 4th Generation wRfx: NONREACTIVE

## 2018-07-04 LAB — LIPASE, BLOOD: LIPASE: 281 U/L — AB (ref 11–51)

## 2018-07-04 MED ORDER — NICOTINE 14 MG/24HR TD PT24
14.0000 mg | MEDICATED_PATCH | Freq: Every day | TRANSDERMAL | Status: DC
  Administered 2018-07-04 – 2018-07-09 (×6): 14 mg via TRANSDERMAL
  Filled 2018-07-04 (×6): qty 1

## 2018-07-04 NOTE — Care Management Important Message (Signed)
Important Message  Patient Details  Name: Shaun Odonnell MRN: [id number] Date of Birth: 1966/08/05   Medicare Important Message Given:  Yes    [name] 07/04/2018, 10:48 AM

## 2018-07-04 NOTE — Progress Notes (Signed)
[facility] [place] NAME: Shaun Odonnell    MR#:  [id number]  DATE OF BIRTH:  1966/02/10  SUBJECTIVE:  CHIEF COMPLAINT:   Chief Complaint  Patient presents with  . Abdominal Pain   Continues to have abdominal pain.   Nausea afebrile  REVIEW OF SYSTEMS:    Review of Systems  Constitutional: Positive for malaise/fatigue. Negative for chills and fever.  HENT: Negative for sore throat.   Eyes: Negative for blurred vision, double vision and pain.  Respiratory: Negative for cough, hemoptysis, shortness of breath and wheezing.   Cardiovascular: Negative for chest pain, palpitations, orthopnea and leg swelling.  Gastrointestinal: Positive for abdominal pain, nausea and vomiting. Negative for constipation, diarrhea and heartburn.  Genitourinary: Negative for dysuria and hematuria.  Musculoskeletal: Negative for back pain and joint pain.  Skin: Negative for rash.  Neurological: Negative for sensory change, speech change, focal weakness and headaches.  Endo/Heme/Allergies: Does not bruise/bleed easily.  Psychiatric/Behavioral: Negative for depression. The patient is nervous/anxious.     DRUG ALLERGIES:   Allergies  Allergen Reactions  . Klonopin [Clonazepam]   . Morphine And Related   . Pimozide   . Stelazine [Trifluoperazine]     VITALS:  Blood pressure (!) 158/90, pulse (!) 112, temperature 98.6 F (37 C), temperature source Oral, resp. rate 16, height 5\' 8"  (1.727 m), weight 102.1 kg, SpO2 92 %.  PHYSICAL EXAMINATION:   Physical Exam  GENERAL:  52 y.o.-year-old patient lying in the bed with no acute distress.  EYES: Pupils equal, round, reactive to light and accommodation. No scleral icterus. Extraocular muscles intact.  HEENT: Head atraumatic, normocephalic. Oropharynx and nasopharynx clear.  NECK:  Supple, no jugular venous distention. No thyroid enlargement, no tenderness.  LUNGS: Normal breath sounds bilaterally, no  wheezing, rales, rhonchi. No use of accessory muscles of respiration.  CARDIOVASCULAR: S1, S2 normal. No murmurs, rubs, or gallops.  ABDOMEN: Soft, diffusely tender, nondistended. Bowel sounds present. No organomegaly or mass.  EXTREMITIES: No cyanosis, clubbing or edema b/l.    NEUROLOGIC: Cranial nerves II through XII are intact. No focal Motor or sensory deficits b/l.   PSYCHIATRIC: The patient is alert and oriented x 3.  SKIN: No obvious rash, lesion, or ulcer.   LABORATORY PANEL:   CBC Recent Labs  Lab 07/03/18 0349  WBC 16.0*  HGB 18.4*  HCT 53.9*  PLT 139*   ------------------------------------------------------------------------------------------------------------------ Chemistries  Recent Labs  Lab 07/04/18 0429  NA 132*  K 3.8  CL 101  CO2 26  GLUCOSE 178*  BUN 12  CREATININE 0.63  CALCIUM 7.6*  AST 38  ALT 38  ALKPHOS 69  BILITOT 1.3*   ------------------------------------------------------------------------------------------------------------------  Cardiac Enzymes Recent Labs  Lab 07/02/18 0754  TROPONINI <0.03   ------------------------------------------------------------------------------------------------------------------  RADIOLOGY:  Ct Abdomen Pelvis W Contrast  Result Date: 07/02/2018 CLINICAL DATA:  52 year old male with a history of upper abdominal pain and vomiting EXAM: CT ABDOMEN AND PELVIS WITH CONTRAST TECHNIQUE: Multidetector CT imaging of the abdomen and pelvis was performed using the standard protocol following bolus administration of intravenous contrast. CONTRAST:  1100mL ISOVUE-300 IOPAMIDOL (ISOVUE-300) INJECTION 61% COMPARISON:  CT 06/05/2014, MR 06/07/2014 FINDINGS: Lower chest: Atelectasis/scarring at the lung bases. Hepatobiliary: Cranial caudal span of the liver measures greater than 21 cm. Diffusely decreased attenuation, similar to prior. Hyperdense material within the gallbladder lumen without pericholecystic fluid or  inflammatory changes. Pancreas: Edematous appearance pancreatic parenchyma with circumferential inflammatory changes/edema of the peripancreatic fat. No focal  fluid collection or cystic changes. The splenic vein appears patent. Portal vein is patent. Uniform enhancement/attenuation of pancreatic parenchyma with no focal hypoenhancement identified. Spleen: Unremarkable spleen Adrenals/Urinary Tract: Unremarkable appearance of the adrenal glands. No evidence of hydronephrosis of the right or left kidney. No nephrolithiasis. Unremarkable course of the bilateral ureters. Unremarkable appearance of the urinary bladder. Stomach/Bowel: Unremarkable appearance of stomach. Unremarkable small bowel. Appendix is not visualized, however, no inflammatory changes are present adjacent to the cecum to indicate an appendicitis. Unremarkable appearance of colon. Vascular/Lymphatic: Atherosclerotic changes of the abdominal aorta and bilateral iliac arteries. No aneurysm. No arterial occlusion identified. Proximal femoral arteries appear patent. Reproductive: Unremarkable pelvic structures Other: Fat containing umbilical hernia. Musculoskeletal: No acute displaced fracture. Mild degenerative changes. IMPRESSION: Acute pancreatitis, with no complicating features identified. Liver steatosis and hepatomegaly. Hyperdense material within the gallbladder lumen, likely microlithiasis/sludge, with no CT evidence of acute cholecystitis. Electronically Signed   By: Corrie Mckusick D.O.   On: 07/02/2018 10:53     ASSESSMENT AND PLAN:   52 y/o m ale with EToh abuse who presents to the ED with abdominal pain  * Acute pancreatitis due to alcohol CT abdomen showing pancreatitis but no gallstones. Lipase improved. Continue pain medications as needed.  IV fluids. Will start clear liquids today.  * ETOH abuse: CIWA protocol ordered  * COPD stable without signs of exacerbation  * BPAD: Continue Zyprexa, Zoloft, and Xanax  * Tobacco  dependence Counseled to quit on admission  All the records are reviewed and case discussed with Care Management/Social Worker Management plans discussed with the patient, family and they are in agreement.  CODE STATUS: FULL CODE  DVT Prophylaxis: SCDs  TOTAL TIME TAKING CARE OF THIS PATIENT: 35 minutes.   POSSIBLE D/C IN 1-2 DAYS, DEPENDING ON CLINICAL CONDITION.  Leia Alf Maryam Feely M.D on 07/04/2018 at 8:54 AM  Between 7am to 6pm - Pager - [phone]  After 6pm go to [email or web address] - password [email or web address] [facility]  Office  [phone]  CC: Primary care physician; Kirk Ruths, MD  Note: This dictation was prepared with Dragon dictation along with smaller phrase technology. Any transcriptional errors that result from this process are unintentional.

## 2018-07-05 LAB — CBC WITH DIFFERENTIAL/PLATELET
BASOS ABS: 0 10*3/uL (ref 0–0.1)
BASOS PCT: 0 %
EOS ABS: 0 10*3/uL (ref 0–0.7)
Eosinophils Relative: 0 %
HCT: 41.3 % (ref 40.0–52.0)
Hemoglobin: 14.1 g/dL (ref 13.0–18.0)
Lymphocytes Relative: 6 %
Lymphs Abs: 0.8 10*3/uL — ABNORMAL LOW (ref 1.0–3.6)
MCH: 33.7 pg (ref 26.0–34.0)
MCHC: 34 g/dL (ref 32.0–36.0)
MCV: 99.2 fL (ref 80.0–100.0)
MONO ABS: 0.8 10*3/uL (ref 0.2–1.0)
MONOS PCT: 6 %
Neutro Abs: 10.8 10*3/uL — ABNORMAL HIGH (ref 1.4–6.5)
Neutrophils Relative %: 88 %
Platelets: 103 10*3/uL — ABNORMAL LOW (ref 150–440)
RBC: 4.17 MIL/uL — ABNORMAL LOW (ref 4.40–5.90)
RDW: 14.4 % (ref 11.5–14.5)
WBC: 12.3 10*3/uL — ABNORMAL HIGH (ref 3.8–10.6)

## 2018-07-05 LAB — COMPREHENSIVE METABOLIC PANEL
ALBUMIN: 3 g/dL — AB (ref 3.5–5.0)
ALT: 29 U/L (ref 0–44)
ANION GAP: 8 (ref 5–15)
AST: 30 U/L (ref 15–41)
Alkaline Phosphatase: 72 U/L (ref 38–126)
BUN: 9 mg/dL (ref 6–20)
CO2: 25 mmol/L (ref 22–32)
Calcium: 7.6 mg/dL — ABNORMAL LOW (ref 8.9–10.3)
Chloride: 94 mmol/L — ABNORMAL LOW (ref 98–111)
Creatinine, Ser: 0.56 mg/dL — ABNORMAL LOW (ref 0.61–1.24)
GFR calc Af Amer: >60 mL/min (ref >60)
GFR calc non Af Amer: >60 mL/min (ref >60)
GLUCOSE: 188 mg/dL — AB (ref 70–99)
POTASSIUM: 3.4 mmol/L — AB (ref 3.5–5.1)
SODIUM: 127 mmol/L — AB (ref 135–145)
Total Bilirubin: 1.5 mg/dL — ABNORMAL HIGH (ref 0.3–1.2)
Total Protein: 6 g/dL — ABNORMAL LOW (ref 6.5–8.1)

## 2018-07-05 LAB — LIPASE, BLOOD: LIPASE: 91 U/L — AB (ref 11–51)

## 2018-07-05 MED ORDER — HYDROMORPHONE HCL 1 MG/ML IJ SOLN
1.0000 mg | INTRAMUSCULAR | Status: DC | PRN
  Administered 2018-07-05 – 2018-07-06 (×2): 1 mg via INTRAVENOUS
  Filled 2018-07-05 (×3): qty 1

## 2018-07-05 NOTE — Progress Notes (Signed)
[facility] [place] NAME: Shaun Odonnell    MR#:  [id number]  DATE OF BIRTH:  1966-01-10  SUBJECTIVE:  CHIEF COMPLAINT:   Chief Complaint  Patient presents with  . Abdominal Pain   Continues to have abdominal pain, pain 8 out of 10 Asking to increase the strength of Dilaudid or increase the frequency afebrile  REVIEW OF SYSTEMS:    Review of Systems  Constitutional: Positive for malaise/fatigue. Negative for chills and fever.  HENT: Negative for sore throat.   Eyes: Negative for blurred vision, double vision and pain.  Respiratory: Negative for cough, hemoptysis, shortness of breath and wheezing.   Cardiovascular: Negative for chest pain, palpitations, orthopnea and leg swelling.  Gastrointestinal: Positive for abdominal pain and nausea. Negative for constipation, diarrhea, heartburn and vomiting.  Genitourinary: Negative for dysuria and hematuria.  Musculoskeletal: Negative for back pain and joint pain.  Skin: Negative for rash.  Neurological: Negative for sensory change, speech change, focal weakness and headaches.  Endo/Heme/Allergies: Does not bruise/bleed easily.  Psychiatric/Behavioral: Negative for depression. The patient is nervous/anxious.     DRUG ALLERGIES:   Allergies  Allergen Reactions  . Klonopin [Clonazepam]   . Morphine And Related   . Pimozide   . Stelazine [Trifluoperazine]     VITALS:  Blood pressure (!) 127/59, pulse 92, temperature 98.4 F (36.9 C), temperature source Oral, resp. rate 18, height 5\' 8"  (1.727 m), weight 102.1 kg, SpO2 94 %.  PHYSICAL EXAMINATION:   Physical Exam  GENERAL:  52 y.o.-year-old patient lying in the bed with no acute distress.  EYES: Pupils equal, round, reactive to light and accommodation. No scleral icterus. Extraocular muscles intact.  HEENT: Head atraumatic, normocephalic. Oropharynx and nasopharynx clear.  NECK:  Supple, no jugular venous distention. No thyroid  enlargement, no tenderness.  LUNGS: Normal breath sounds bilaterally, no wheezing, rales, rhonchi. No use of accessory muscles of respiration.  CARDIOVASCULAR: S1, S2 normal. No murmurs, rubs, or gallops.  ABDOMEN: Soft, diffusely tender, nondistended. Bowel sounds present. No organomegaly or mass.  EXTREMITIES: No cyanosis, clubbing or edema b/l.    NEUROLOGIC: Cranial nerves II through XII are intact. No focal Motor or sensory deficits b/l.   PSYCHIATRIC: The patient is alert and oriented x 3.  SKIN: No obvious rash, lesion, or ulcer.   LABORATORY PANEL:   CBC Recent Labs  Lab 07/05/18 0409  WBC 12.3*  HGB 14.1  HCT 41.3  PLT 103*   ------------------------------------------------------------------------------------------------------------------ Chemistries  Recent Labs  Lab 07/05/18 0409  NA 127*  K 3.4*  CL 94*  CO2 25  GLUCOSE 188*  BUN 9  CREATININE 0.56*  CALCIUM 7.6*  AST 30  ALT 29  ALKPHOS 72  BILITOT 1.5*   ------------------------------------------------------------------------------------------------------------------  Cardiac Enzymes Recent Labs  Lab 07/02/18 0754  TROPONINI <0.03   ------------------------------------------------------------------------------------------------------------------  RADIOLOGY:  No results found.   ASSESSMENT AND PLAN:   52 y/o m ale with EToh abuse who presents to the ED with abdominal pain  * Acute pancreatitis due to alcohol Still having significant abdominal pain Discontinue clear liquid diet.  Strict n.p.o. except meds CT abdomen showing pancreatitis but no gallstones. Lipase improved. Continue pain medications as needed.  IV fluids. Incentive spirometry Out of bed and ambulate as tolerated  * ETOH abuse: CIWA protocol ordered  * COPD stable without signs of exacerbation  * BPAD: Continue Zyprexa, Zoloft, and Xanax  * Tobacco dependence Counseled to quit on admission  All the  records are  reviewed and case discussed with Care Management/Social Worker Management plans discussed with the patient, family and they are in agreement.  CODE STATUS: FULL CODE  DVT Prophylaxis: SCDs  TOTAL TIME TAKING CARE OF THIS PATIENT: 35 minutes.   POSSIBLE D/C IN 1-2 DAYS, DEPENDING ON CLINICAL CONDITION.  Nicholes Mango M.D on 07/05/2018 at 12:50 PM  Between 7am to 6pm - Pager - [phone]   After 6pm go to [email or web address] - password [email or web address] [facility]  Office  [phone]  CC: Primary care physician; Kirk Ruths, MD  Note: This dictation was prepared with Dragon dictation along with smaller phrase technology. Any transcriptional errors that result from this process are unintentional.

## 2018-07-06 ENCOUNTER — Inpatient Hospital Stay: Payer: Medicare Other

## 2018-07-06 ENCOUNTER — Encounter: Payer: Self-pay | Admitting: Radiology

## 2018-07-06 LAB — COMPREHENSIVE METABOLIC PANEL
ALK PHOS: 88 U/L (ref 38–126)
ALT: 25 U/L (ref 0–44)
AST: 25 U/L (ref 15–41)
Albumin: 2.8 g/dL — ABNORMAL LOW (ref 3.5–5.0)
Anion gap: 9 (ref 5–15)
BUN: 8 mg/dL (ref 6–20)
CALCIUM: 8 mg/dL — AB (ref 8.9–10.3)
CHLORIDE: 94 mmol/L — AB (ref 98–111)
CO2: 29 mmol/L (ref 22–32)
Creatinine, Ser: 0.61 mg/dL (ref 0.61–1.24)
GFR calc Af Amer: >60 mL/min (ref >60)
Glucose, Bld: 194 mg/dL — ABNORMAL HIGH (ref 70–99)
Potassium: 3.1 mmol/L — ABNORMAL LOW (ref 3.5–5.1)
Sodium: 132 mmol/L — ABNORMAL LOW (ref 135–145)
Total Bilirubin: 1.5 mg/dL — ABNORMAL HIGH (ref 0.3–1.2)
Total Protein: 6.6 g/dL (ref 6.5–8.1)

## 2018-07-06 LAB — CBC
HEMATOCRIT: 41.2 % (ref 40.0–52.0)
Hemoglobin: 14.1 g/dL (ref 13.0–18.0)
MCH: 33.6 pg (ref 26.0–34.0)
MCHC: 34.2 g/dL (ref 32.0–36.0)
MCV: 98.1 fL (ref 80.0–100.0)
Platelets: 126 10*3/uL — ABNORMAL LOW (ref 150–440)
RBC: 4.2 MIL/uL — ABNORMAL LOW (ref 4.40–5.90)
RDW: 14.6 % — AB (ref 11.5–14.5)
WBC: 13 10*3/uL — ABNORMAL HIGH (ref 3.8–10.6)

## 2018-07-06 LAB — LIPASE, BLOOD: Lipase: 52 U/L — ABNORMAL HIGH (ref 11–51)

## 2018-07-06 MED ORDER — IOPAMIDOL (ISOVUE-300) INJECTION 61%
100.0000 mL | Freq: Once | INTRAVENOUS | Status: AC | PRN
  Administered 2018-07-06: 100 mL via INTRAVENOUS

## 2018-07-06 MED ORDER — POTASSIUM CHLORIDE CRYS ER 20 MEQ PO TBCR
40.0000 meq | EXTENDED_RELEASE_TABLET | ORAL | Status: AC
  Administered 2018-07-06 (×2): 40 meq via ORAL
  Filled 2018-07-06: qty 4
  Filled 2018-07-06: qty 2

## 2018-07-06 MED ORDER — HYDROMORPHONE HCL 1 MG/ML IJ SOLN
1.0000 mg | INTRAMUSCULAR | Status: DC | PRN
  Administered 2018-07-06 – 2018-07-07 (×2): 1 mg via INTRAVENOUS
  Filled 2018-07-06 (×2): qty 1

## 2018-07-06 MED ORDER — HYDROMORPHONE HCL 1 MG/ML PO LIQD: 1.0000 mg | ORAL | Status: DC | PRN

## 2018-07-06 MED ORDER — HYDROMORPHONE HCL 1 MG/ML IJ SOLN: 1.0000 mg | Freq: Four times a day (QID) | INTRAMUSCULAR | Status: DC | PRN

## 2018-07-06 NOTE — Consult Note (Signed)
SURGICAL CONSULTATION NOTE   HISTORY OF PRESENT ILLNESS (HPI):  52 y.o. male admitted on 07/02/18 due to acute pancreatitis. The patient refers he came to ER with severe abdominal pain. Pain was on epigastric area that radiated to the back. Pain was so bad that he could no be still. No position will improve pain. Pain aggravated with movement. Nothing improved the pain. Pain radiated from epigastric to the back to both flanks. Since admission patient refers that pain has improve. Initially the pain was 10/10 and now 7/10. Patient can be sitting still with pain control. But still with significant pain. He was admitted with Lipase on 787 and has decreased to 52. WBC has decreased from 16.5 to 13. Due to persistent singificant pain CT scan was repeated today. Images were personally reviewed. There is significant fat stranding with suspected pancreatic necrosis. Important detail, there is no air bubbles to suggest infected pancreatic necrosis.   Surgery is consulted by Dr. Margaretmary Eddy in this context for evaluation and management of pancreatic necrosis.  PAST MEDICAL HISTORY (PMH):  Past Medical History:  Diagnosis Date  . ARDS (adult respiratory distress syndrome) ([place])   . ARDS (adult respiratory distress syndrome) ([place])   . Bipolar disorder ([place])   . Borderline diabetes   . COPD (chronic obstructive pulmonary disease) ([facility])    chronic cough and wheezing  . Depression   . Dizziness    medication related  . Dyspnea    with exertion  . Elevated lipids   . Fatty liver   . Hypercholesterolemia   . Pancreatitis   . Pneumonia    in past  . Pneumonia    in past  . Pre-diabetes    diet controlled  . Pulmonary embolism ([place])   . Schizoaffective disorder ([place])      PAST SURGICAL HISTORY ([place]):  Past Surgical History:  Procedure Laterality Date  . CATARACT EXTRACTION W/PHACO Right 03/26/2018   Procedure: CATARACT EXTRACTION PHACO AND INTRAOCULAR LENS PLACEMENT ([place]) RIGHT BORDERLINE DIABETIC;   Surgeon: Leandrew Koyanagi, MD;  Location: [facility];  Service: Ophthalmology;  Laterality: Right;  . CATARACT EXTRACTION W/PHACO Left 04/23/2018   Procedure: CATARACT EXTRACTION PHACO AND INTRAOCULAR LENS PLACEMENT ([place])  LEFT BORDERLINE DIABETIC;  Surgeon: Leandrew Koyanagi, MD;  Location: [facility];  Service: Ophthalmology;  Laterality: Left;  . COLONOSCOPY WITH PROPOFOL N/A 08/21/2017   Procedure: COLONOSCOPY WITH PROPOFOL;  Surgeon: Manya Silvas, MD;  Location[facility]COPY;  Service: Endoscopy;  Laterality: N/A;  . HERNIA REPAIR       MEDICATIONS:  Prior to Admission medications   Medication Sig Start Date End Date Taking? Authorizing Provider  albuterol (PROVENTIL HFA;VENTOLIN HFA) 108 (90 Base) MCG/ACT inhaler Inhale 2 puffs into the lungs every 6 (six) hours as needed for wheezing or shortness of breath. 09/25/16  Yes Delman Kitten, MD  ALPRAZolam Duanne Moron) 0.5 MG tablet Take 0.5 mg by mouth 3 (three) times daily. 06/26/18  Yes [provider]  OLANZapine (ZYPREXA) 15 MG tablet Take 15 mg by mouth at bedtime.   Yes [provider]  sertraline (ZOLOFT) 100 MG tablet Take 100 mg by mouth daily. am   Yes [provider]  simvastatin (ZOCOR) 40 MG tablet Take 40 mg by mouth at bedtime.    Yes [provider]     ALLERGIES:  Allergies  Allergen Reactions  . Klonopin [Clonazepam]   . Morphine And Related   . Pimozide   . Stelazine [Trifluoperazine]  SOCIAL HISTORY:  Social History   Socioeconomic History  . Marital status: Single    Spouse name: Not on file  . Number of children: Not on file  . Years of education: Not on file  . Highest education level: Not on file  Occupational History  . Not on file  Social Needs  . Financial resource strain: Not on file  . Food insecurity:    Worry: Not on file    Inability: Not on file  . Transportation needs:    Medical: Not on file    Non-medical: Not on file   Tobacco Use  . Smoking status: Current Every Day Smoker    Packs/day: 1.00    Years: 30.00    Pack years: 30.00    Types: Cigarettes  . Smokeless tobacco: Former Systems developer    Types: Snuff  Substance and Sexual Activity  . Alcohol use: Yes    Alcohol/week: 4.0 standard drinks    Types: 4 Cans of beer per week    Comment: history of, none since February   . Drug use: Not Currently  . Sexual activity: Not on file  Lifestyle  . Physical activity:    Days per week: Not on file    Minutes per session: Not on file  . Stress: Not on file  Relationships  . Social connections:    Talks on phone: Not on file    Gets together: Not on file    Attends religious service: Not on file    Active member of club or organization: Not on file    Attends meetings of clubs or organizations: Not on file    Relationship status: Not on file  . Intimate partner violence:    Fear of current or ex partner: Not on file    Emotionally abused: Not on file    Physically abused: Not on file    Forced sexual activity: Not on file  Other Topics Concern  . Not on file  Social History Narrative  . Not on file    The patient currently resides (home / rehab facility / nursing home): Home The patient normally is (ambulatory / bedbound): Ambulatory   FAMILY HISTORY:  History reviewed. No pertinent family history.   REVIEW OF SYSTEMS:  Constitutional: denies weight loss, fever, chills, or sweats  Eyes: denies any other vision changes, history of eye injury  ENT: denies sore throat, hearing problems  Respiratory: denies shortness of breath, wheezing  Cardiovascular: denies chest pain, palpitations  Gastrointestinal: positive abdominal pain, negative  N/V, or diarrheaI Genitourinary: denies burning with urination or urinary frequency Musculoskeletal: denies any other joint pains or cramps  Skin: denies any other rashes or skin discolorations  Neurological: denies any other headache, dizziness, weakness   Psychiatric: denies any other depression, anxiety   All other review of systems were negative   VITAL SIGNS:  Temp:  [98.2 F (36.8 C)-99.2 F (37.3 C)] 98.2 F (36.8 C) (09/01 1505) Pulse Rate:  [95-110] 95 (09/01 1505) Resp:  [18-19] 18 (09/01 1505) BP: (132-153)/(84) 153/84 (09/01 1505) SpO2:  [83 %-95 %] 95 % (09/01 1505)     Height: 5\' 8"  (172.7 cm) Weight: 102.1 kg BMI (Calculated): 34.22   INTAKE/OUTPUT:  This shift: No intake/output data recorded.  Last 2 shifts: @IOLAST2SHIFTS @   PHYSICAL EXAM:  Constitutional:  -- Normal body habitus -- Awake, alert, and oriented x3  Eyes:  -- Pupils equally round and reactive to light  -- No scleral icterus  Ear,  nose, and throat:  -- No jugular venous distension  Pulmonary:  -- No crackles  -- Equal breath sounds bilaterally -- Breathing non-labored at rest Cardiovascular:  -- S1, S2 present  -- No pericardial rubs Gastrointestinal:  -- Abdomen soft, mild tender, distended-distended, no guarding or rebound tenderness -- No abdominal masses appreciated, pulsatile or otherwise  Musculoskeletal and Integumentary:  -- Wounds or skin discoloration: large bruise on the left lower quadrant from Lovenox injection -- Extremities: B/L UE and LE FROM, hands and feet warm, no edema  Neurologic:  -- Motor function: intact and symmetric -- Sensation: intact and symmetric   Labs:  CBC Latest Ref Rng & Units 07/06/2018 07/05/2018 07/03/2018  WBC 3.8 - 10.6 K/uL 13.0(H) 12.3(H) 16.0(H)  Hemoglobin 13.0 - 18.0 g/dL 14.1 14.1 18.4(H)  Hematocrit 40.0 - 52.0 % 41.2 41.3 53.9(H)  Platelets 150 - 440 K/uL 126(L) 103(L) 139(L)   CMP Latest Ref Rng & Units 07/06/2018 07/05/2018 07/04/2018  Glucose 70 - 99 mg/dL 194(H) 188(H) 178(H)  BUN 6 - 20 mg/dL 8 9 12   Creatinine 0.61 - 1.24 mg/dL 0.61 0.56(L) 0.63  Sodium 135 - 145 mmol/L 132(L) 127(L) 132(L)  Potassium 3.5 - 5.1 mmol/L 3.1(L) 3.4(L) 3.8  Chloride 98 - 111 mmol/L 94(L) 94(L) 101  CO2 22  - 32 mmol/L 29 25 26   Calcium 8.9 - 10.3 mg/dL 8.0(L) 7.6(L) 7.6(L)  Total Protein 6.5 - 8.1 g/dL 6.6 6.0(L) 5.6(L)  Total Bilirubin 0.3 - 1.2 mg/dL 1.5(H) 1.5(H) 1.3(H)  Alkaline Phos 38 - 126 U/L 88 72 69  AST 15 - 41 U/L 25 30 38  ALT 0 - 44 U/L 25 29 38   Lipase: 52  Imaging studies:  EXAM: CT ABDOMEN AND PELVIS WITH CONTRAST  TECHNIQUE: Multidetector CT imaging of the abdomen and pelvis was performed using the standard protocol following bolus administration of intravenous contrast.  CONTRAST:  172mL ISOVUE-300 IOPAMIDOL (ISOVUE-300) INJECTION 61%  COMPARISON:  CT abdomen pelvis 07/02/2018  FINDINGS: Lower chest: New small left pleural effusion and adjacent consolidation favored to represent atelectasis. Bilateral patchy ground-glass opacities. Normal heart size.  Hepatobiliary: Liver is diffusely low in attenuation compatible with steatosis. High attenuation within the gallbladder lumen which may represent contrast or sludge. No intrahepatic or extrahepatic biliary ductal dilatation.  Pancreas: The pancreas is edematous with peripancreatic fluid and fat stranding. There is hypoenhancement involving the pancreatic tail (image 35; series 2).  Spleen: Unremarkable  Adrenals/Urinary Tract: Normal adrenal glands. Kidneys enhance symmetrically with contrast. No hydronephrosis. Urinary bladder is unremarkable. Punctate stone inferior pole left kidney.  Stomach/Bowel: Normal morphology of the stomach. No evidence for small bowel obstruction. Small volume free fluid within the central small bowel mesentery and within the pericolic gutters, increased from prior.  Vascular/Lymphatic: Normal caliber abdominal aorta. Peripheral calcified atherosclerotic plaque. No retroperitoneal lymphadenopathy.  Reproductive: Prostate unremarkable.  Other: None.  Musculoskeletal: No aggressive or acute appearing osseous lesions.  IMPRESSION: Relative hypoenhancement  of the pancreatic tail concerning for distal pancreatic necrosis in the setting of acute pancreatitis. Interval increase in peripancreatic fluid and fat stranding.  Interval increase in fluid within the mesentery and pericolic gutters.  New small left pleural effusion and underlying opacities favored to represent atelectasis.  Hepatic steatosis.   Electronically Signed   By: Lovey Newcomer M.D.   On: 07/06/2018 15:24   Assessment/Plan:  52 y.o. male with acute pancreatitis with small area of tail necrosis, complicated by pertinent comorbidities including alcohol user, COPD, smoker. Patient the acute  pancreatitis has progressed to pancreatic necrosis. 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 Patient has been improving clinically but slow. There is a significant improve in lipse and mild improve in WBC. There is no organ failure or systemic deterioration. It has been studied that early surgical intervention within the first 2 weeks of symptom onset is associated with an unacceptable high mortality rates up to 75% mortality vs 8% when surgery is delayed after 30 days of symptoms. Mortality can be decreased significantly with delay of operative intervention beyond 4 weeks.  In patient with infected necrotizing pancreatitis and clinical deterioration, source control will be indicated In such cases, the availabilit of less invasive techniques such as percutaneous or endoscopic drainage allows for deferment of surgical intervention and in some cases completely avoids the need for surgery. Indication for either radiological, endoscopic or surgical intervention will be suspectd or documented infected necrotizing pancreatitis in the setting of clinical deterioration or unresolving organ failure preferably more than 4 weeks after symptom onset.  If that is the case the Step-Up Approach should be followed: first step percutaneous or endoscopic transgastric drainage,  followed by video assisted retroperitoneal drainage if it does not improve with percutaneous drainage. This will allow source control while conservative and supportive management continues.  I consider that the patient is slowly responding to current therapy and none of this approach will be needed if patient continue to improve. I will follow patient with you to contribute in his managements and recommendations to try to avoid the need of more invasive procedures.   All of the above findings and recommendations were discussed with the patient, and all of patient's questions were answered to his expressed satisfaction.  Total time orienting patient about condition and recommendations was 45 minutes.   Arnold Long, MD

## 2018-07-06 NOTE — Progress Notes (Signed)
[facility] [place] NAME: Shaun Odonnell    MR#:  [id number]  DATE OF BIRTH:  06-21-1966  SUBJECTIVE:  CHIEF COMPLAINT:   Chief Complaint  Patient presents with  . Abdominal Pain   Continues to have abdominal pain, pain 7 out of 10, slightly better than yesterday Tolerating clear liquids agreeable to discontinue IV Dilaudid Asking to increase the frequency of oxycodone  REVIEW OF SYSTEMS:    Review of Systems  Constitutional: Positive for malaise/fatigue. Negative for chills and fever.  HENT: Negative for sore throat.   Eyes: Negative for blurred vision, double vision and pain.  Respiratory: Negative for cough, hemoptysis, shortness of breath and wheezing.   Cardiovascular: Negative for chest pain, palpitations, orthopnea and leg swelling.  Gastrointestinal: Positive for abdominal pain and nausea. Negative for constipation, diarrhea, heartburn and vomiting.  Genitourinary: Negative for dysuria and hematuria.  Musculoskeletal: Negative for back pain and joint pain.  Skin: Negative for rash.  Neurological: Negative for sensory change, speech change, focal weakness and headaches.  Endo/Heme/Allergies: Does not bruise/bleed easily.  Psychiatric/Behavioral: Negative for depression. The patient is nervous/anxious.     DRUG ALLERGIES:   Allergies  Allergen Reactions  . Klonopin [Clonazepam]   . Morphine And Related   . Pimozide   . Stelazine [Trifluoperazine]     VITALS:  Blood pressure 132/84, pulse (!) 110, temperature 99.2 F (37.3 C), temperature source Oral, resp. rate 19, height 5\' 8"  (1.727 m), weight 102.1 kg, SpO2 92 %.  PHYSICAL EXAMINATION:   Physical Exam  GENERAL:  52 y.o.-year-old patient lying in the bed with no acute distress.  EYES: Pupils equal, round, reactive to light and accommodation. No scleral icterus. Extraocular muscles intact.  HEENT: Head atraumatic, normocephalic. Oropharynx and nasopharynx clear.   NECK:  Supple, no jugular venous distention. No thyroid enlargement, no tenderness.  LUNGS: Normal breath sounds bilaterally, no wheezing, rales, rhonchi. No use of accessory muscles of respiration.  CARDIOVASCULAR: S1, S2 normal. No murmurs, rubs, or gallops.  ABDOMEN: Soft, diffusely tender, nondistended. Bowel sounds present. No organomegaly or mass.  EXTREMITIES: No cyanosis, clubbing or edema b/l.    NEUROLOGIC: Cranial nerves II through XII are intact. No focal Motor or sensory deficits b/l.   PSYCHIATRIC: The patient is alert and oriented x 3.  SKIN: No obvious rash, lesion, or ulcer.   LABORATORY PANEL:   CBC Recent Labs  Lab 07/06/18 0404  WBC 13.0*  HGB 14.1  HCT 41.2  PLT 126*   ------------------------------------------------------------------------------------------------------------------ Chemistries  Recent Labs  Lab 07/06/18 0404  NA 132*  K 3.1*  CL 94*  CO2 29  GLUCOSE 194*  BUN 8  CREATININE 0.61  CALCIUM 8.0*  AST 25  ALT 25  ALKPHOS 88  BILITOT 1.5*   ------------------------------------------------------------------------------------------------------------------  Cardiac Enzymes Recent Labs  Lab 07/02/18 0754  TROPONINI <0.03   ------------------------------------------------------------------------------------------------------------------  RADIOLOGY:  No results found.   ASSESSMENT AND PLAN:   52 y/o m ale with EToh abuse who presents to the ED with abdominal pain  * Acute pancreatitis due to alcohol Still having significant abdominal pain Discontinue clear liquid diet.  Strict n.p.o. except meds CT abdomen showing pancreatitis but no gallstones.  As patient's pain is not improving will get repeat CT scan to make sure no infarction or pancreatic pseudocyst developing Lipase improved. Continue pain medications as needed.  Dilaudid discontinued, patient is agreeable IV fluids. Incentive spirometry Out of bed and ambulate as  tolerated  *  ETOH abuse: CIWA protocol ordered  * COPD stable without signs of exacerbation  * BPAD: Continue Zyprexa, Zoloft, and Xanax  * Tobacco dependence Counseled to quit on admission  All the records are reviewed and case discussed with Care Management/Social Worker Management plans discussed with the patient, family and they are in agreement.  CODE STATUS: FULL CODE  DVT Prophylaxis: SCDs  TOTAL TIME TAKING CARE OF THIS PATIENT: 33 minutes.   POSSIBLE D/C IN 1-2 DAYS, DEPENDING ON CLINICAL CONDITION.  Nicholes Mango M.D on 07/06/2018 at 11:52 AM  Between 7am to 6pm - Pager - [phone]   After 6pm go to [email or web address] - password [email or web address] [facility]  Office  [phone]  CC: Primary care physician; Kirk Ruths, MD  Note: This dictation was prepared with Dragon dictation along with smaller phrase technology. Any transcriptional errors that result from this process are unintentional.

## 2018-07-06 NOTE — Progress Notes (Signed)
Repeat CT scan with possible pancreatic tail necrosis with interval increase in the fluid.  Tried calling the patient's room several times to discuss about the results and was unsuccessful.  RN notified.  Patient was made n.p.o.  GI and surgery consult placed

## 2018-07-07 LAB — BILIRUBIN, FRACTIONATED(TOT/DIR/INDIR)
BILIRUBIN INDIRECT: 1.2 mg/dL — AB (ref 0.3–0.9)
Bilirubin, Direct: 0.5 mg/dL — ABNORMAL HIGH (ref 0.0–0.2)
Total Bilirubin: 1.7 mg/dL — ABNORMAL HIGH (ref 0.3–1.2)

## 2018-07-07 LAB — TRIGLYCERIDES: TRIGLYCERIDES: 133 mg/dL (ref <150)

## 2018-07-07 MED ORDER — HYDROMORPHONE HCL 1 MG/ML IJ SOLN: 1.0000 mg | Freq: Four times a day (QID) | INTRAMUSCULAR | Status: DC | PRN

## 2018-07-07 MED ORDER — HYDROCODONE-ACETAMINOPHEN 7.5-325 MG PO TABS
1.0000 | ORAL_TABLET | Freq: Four times a day (QID) | ORAL | Status: DC | PRN
  Administered 2018-07-07 (×3): 2 via ORAL
  Administered 2018-07-08: 1 via ORAL
  Administered 2018-07-08: 2 via ORAL
  Administered 2018-07-08: 12:00:00 1 via ORAL
  Administered 2018-07-08 – 2018-07-09 (×3): 2 via ORAL
  Filled 2018-07-07: qty 1
  Filled 2018-07-07 (×5): qty 2
  Filled 2018-07-07: qty 1
  Filled 2018-07-07 (×2): qty 2

## 2018-07-07 NOTE — Progress Notes (Signed)
[facility] Day(s): 5.   Post op day(s):  Marland Kitchen   Interval History: Patient seen and examined, no acute events or new complaints overnight. Patient reports no improvement or worsening of pain, denies fever or chills.  Vital signs in last 24 hours: [min-max] current  Temp:  [98 F (36.7 C)-98.5 F (36.9 C)] 98.4 F (36.9 C) (09/02 1234) Pulse Rate:  [85-97] 85 (09/02 1234) Resp:  [18-20] 20 (09/02 1233) BP: (140-164)/(82-92) 140/82 (09/02 1233) SpO2:  [93 %-97 %] 96 % (09/02 1234)     Height: 5\' 8"  (172.7 cm) Weight: 102.1 kg BMI (Calculated): 34.22   Physical Exam:  Constitutional: alert, cooperative and no distress  Respiratory: breathing non-labored at rest  Cardiovascular: regular rate and sinus rhythm  Gastrointestinal: soft, patient with pain but not tender to the palpation, and globose.   Labs:  CBC Latest Ref Rng & Units 07/06/2018 07/05/2018 07/03/2018  WBC 3.8 - 10.6 K/uL 13.0(H) 12.3(H) 16.0(H)  Hemoglobin 13.0 - 18.0 g/dL 14.1 14.1 18.4(H)  Hematocrit 40.0 - 52.0 % 41.2 41.3 53.9(H)  Platelets 150 - 440 K/uL 126(L) 103(L) 139(L)   CMP Latest Ref Rng & Units 07/07/2018 07/06/2018 07/05/2018  Glucose 70 - 99 mg/dL - 194(H) 188(H)  BUN 6 - 20 mg/dL - 8 9  Creatinine 0.61 - 1.24 mg/dL - 0.61 0.56(L)  Sodium 135 - 145 mmol/L - 132(L) 127(L)  Potassium 3.5 - 5.1 mmol/L - 3.1(L) 3.4(L)  Chloride 98 - 111 mmol/L - 94(L) 94(L)  CO2 22 - 32 mmol/L - 29 25  Calcium 8.9 - 10.3 mg/dL - 8.0(L) 7.6(L)  Total Protein 6.5 - 8.1 g/dL - 6.6 6.0(L)  Total Bilirubin 0.3 - 1.2 mg/dL 1.7(H) 1.5(H) 1.5(H)  Alkaline Phos 38 - 126 U/L - 88 72  AST 15 - 41 U/L - 25 30  ALT 0 - 44 U/L - 25 29    Imaging studies: No new pertinent imaging studies   Assessment/Plan:  52 y.o. male with acute pancreatitis with small area of tail necrosis, complicated by pertinent comorbidities including alcohol user, COPD, smoker. Patient without significant change of clinical status.  Bilirubinemia is mainly indirect. There is no indication of cholecystectomy since patient has no stone on multiple ultrasounds and MRI. Patient with known history of EtOH abuse.  Agree with current management and continue supportive therapy. No surgical indication at this moment.   Arnold Long, MD

## 2018-07-07 NOTE — Care Management Important Message (Signed)
Important Message  Patient Details  Name: Shaun Odonnell MRN: [id number] Date of Birth: 02-Oct-1966   Medicare Important Message Given:  Yes    Juliann Pulse A Miroslava Santellan 07/07/2018, 12:21 PM

## 2018-07-07 NOTE — Progress Notes (Signed)
[facility] [place] NAME: Shaun Odonnell    MR#:  [id number]  DATE OF BIRTH:  [birth year]  SUBJECTIVE:  CHIEF COMPLAINT:   Chief Complaint  Patient presents with  . Abdominal Pain   Continues to complain of pain.  Parents at bedside.  REVIEW OF SYSTEMS:    Review of Systems  Constitutional: Positive for malaise/fatigue. Negative for chills and fever.  HENT: Negative for sore throat.   Eyes: Negative for blurred vision, double vision and pain.  Respiratory: Negative for cough, hemoptysis, shortness of breath and wheezing.   Cardiovascular: Negative for chest pain, palpitations, orthopnea and leg swelling.  Gastrointestinal: Positive for abdominal pain and nausea. Negative for constipation, diarrhea, heartburn and vomiting.  Genitourinary: Negative for dysuria and hematuria.  Musculoskeletal: Negative for back pain and joint pain.  Skin: Negative for rash.  Neurological: Negative for sensory change, speech change, focal weakness and headaches.  Endo/Heme/Allergies: Does not bruise/bleed easily.  Psychiatric/Behavioral: Negative for depression. The patient is nervous/anxious.     DRUG ALLERGIES:   Allergies  Allergen Reactions  . Klonopin [Clonazepam]   . Morphine And Related   . Pimozide   . Stelazine [Trifluoperazine]     VITALS:  Blood pressure (!) 164/92, pulse 97, temperature 98.5 F (36.9 C), temperature source Oral, resp. rate 18, height 5\' 8"  (1.727 m), weight 102.1 kg, SpO2 93 %.  PHYSICAL EXAMINATION:   Physical Exam  GENERAL:  51 y.o.-year-old patient lying in the bed with no acute distress.  EYES: Pupils equal, round, reactive to light and accommodation. No scleral icterus. Extraocular muscles intact.  HEENT: Head atraumatic, normocephalic. Oropharynx and nasopharynx clear.  NECK:  Supple, no jugular venous distention. No thyroid enlargement, no tenderness.  LUNGS: Normal breath sounds bilaterally, no wheezing,  rales, rhonchi. No use of accessory muscles of respiration.  CARDIOVASCULAR: S1, S2 normal. No murmurs, rubs, or gallops.  ABDOMEN: Soft, diffusely tender, distended. Bowel sounds present. No organomegaly or mass.  EXTREMITIES: No cyanosis, clubbing or edema b/l.    NEUROLOGIC: Cranial nerves II through XII are intact. No focal Motor or sensory deficits b/l.   PSYCHIATRIC: The patient is alert and oriented x 3.  SKIN: No obvious rash, lesion, or ulcer.   LABORATORY PANEL:   CBC Recent Labs  Lab 07/06/18 0404  WBC 13.0*  HGB 14.1  HCT 41.2  PLT 126*   ------------------------------------------------------------------------------------------------------------------ Chemistries  Recent Labs  Lab 07/06/18 0404  NA 132*  K 3.1*  CL 94*  CO2 29  GLUCOSE 194*  BUN 8  CREATININE 0.61  CALCIUM 8.0*  AST 25  ALT 25  ALKPHOS 88  BILITOT 1.5*   ------------------------------------------------------------------------------------------------------------------  Cardiac Enzymes Recent Labs  Lab 07/02/18 0754  TROPONINI <0.03   ------------------------------------------------------------------------------------------------------------------  RADIOLOGY:  Ct Abdomen Pelvis W Contrast  Result Date: 07/06/2018 CLINICAL DATA:  Patient with known pancreatitis.  Continued pain. EXAM: CT ABDOMEN AND PELVIS WITH CONTRAST TECHNIQUE: Multidetector CT imaging of the abdomen and pelvis was performed using the standard protocol following bolus administration of intravenous contrast. CONTRAST:  118mL ISOVUE-300 IOPAMIDOL (ISOVUE-300) INJECTION 61% COMPARISON:  CT abdomen pelvis 07/02/2018 FINDINGS: Lower chest: New small left pleural effusion and adjacent consolidation favored to represent atelectasis. Bilateral patchy ground-glass opacities. Normal heart size. Hepatobiliary: Liver is diffusely low in attenuation compatible with steatosis. High attenuation within the gallbladder lumen which may  represent contrast or sludge. No intrahepatic or extrahepatic biliary ductal dilatation. Pancreas: The pancreas is edematous with peripancreatic  fluid and fat stranding. There is hypoenhancement involving the pancreatic tail (image 35; series 2). Spleen: Unremarkable Adrenals/Urinary Tract: Normal adrenal glands. Kidneys enhance symmetrically with contrast. No hydronephrosis. Urinary bladder is unremarkable. Punctate stone inferior pole left kidney. Stomach/Bowel: Normal morphology of the stomach. No evidence for small bowel obstruction. Small volume free fluid within the central small bowel mesentery and within the pericolic gutters, increased from prior. Vascular/Lymphatic: Normal caliber abdominal aorta. Peripheral calcified atherosclerotic plaque. No retroperitoneal lymphadenopathy. Reproductive: Prostate unremarkable. Other: None. Musculoskeletal: No aggressive or acute appearing osseous lesions. IMPRESSION: Relative hypoenhancement of the pancreatic tail concerning for distal pancreatic necrosis in the setting of acute pancreatitis. Interval increase in peripancreatic fluid and fat stranding. Interval increase in fluid within the mesentery and pericolic gutters. New small left pleural effusion and underlying opacities favored to represent atelectasis. Hepatic steatosis. Electronically Signed   By: Lovey Newcomer M.D.   On: 07/06/2018 15:24     ASSESSMENT AND PLAN:   52 y/o m ale with EToh abuse who presents to the ED with abdominal pain  * Acute pancreatitis due to alcohol Continues to have pain.  On clear liquids. Repeat CT scan of the abdomen and pelvis showed pancreatic necrosis.  No abscess. Lipase improved. Continue pain medications as needed.  Change oxycodone to hydrocodone.  Decreased Dilaudid. Incentive spirometry Out of bed and ambulate as tolerated  Discussed with patient at length regarding pain medications. Patient's mother tells me that he has had problems with abusing narcotics in  the past.  * ETOH abuse: CIWA protocol   * COPD stable without signs of exacerbation  * BPAD: Continue Zyprexa, Zoloft, and Xanax  * Tobacco dependence Counseled to quit on admission  All the records are reviewed and case discussed with Care Management/Social Worker Management plans discussed with the patient, family and they are in agreement.  CODE STATUS: FULL CODE  DVT Prophylaxis: SCDs  TOTAL TIME TAKING CARE OF THIS PATIENT: 33 minutes.   POSSIBLE D/C IN 1-2 DAYS, DEPENDING ON CLINICAL CONDITION.  Leia Alf Jaion Lagrange M.D on 07/07/2018 at 12:06 PM  Between 7am to 6pm - Pager - [phone]  After 6pm go to [email or web address] - password [email or web address] [facility]  Office  [phone]  CC: Primary care physician; Kirk Ruths, MD  Note: This dictation was prepared with Dragon dictation along with smaller phrase technology. Any transcriptional errors that result from this process are unintentional.

## 2018-07-07 NOTE — Consult Note (Addendum)
Shaun Odonnell , MD [address] [place], [place], [place], [address] [address], [place], [place], [place], [address] Phone: [phone]  Fax: [phone]  Consultation  Referring Provider:    Dr Darvin Neighbours  Primary Care Physician:  Kirk Ruths, MD Primary Gastroenterologist: Dr Tiffany Kocher         Reason for Consultation:     Pancreatitis  Date of Admission:  07/02/2018 Date of Consultation:  07/07/2018         HPI:   Shaun Odonnell is a 52 y.o. male presented to the hospital on 07/02/2018 with abdominal pain.  He has a known history of alcohol abuse, COPD.  His last drink of alcohol was last weekend.  He has had 2 prior episodes of pancreatitis over 15 years back.  He under went a CT scan of the abdomen on 07/02/2018 that showed features of acute pancreatitis and no complications.  Liver steatosis.  Possible microlithiasis/sludge.  Repeat CTscan performed on 07/06/2018 showed features concerning for distal pancreatic necrosis in the setting of acute pancreatitis, interval increase in peripancreatic fluid and fat stranding.  Small left pleural effusion , likely atelectasis and hepatic steatosis.  On admission 07/02/2018 his white cell count was 16.5 with a hemoglobin of 18.4, elevation in AST at 79 and ALT at 115, lipase of 787.  On admission bilirubin was normal but 3 days back to 1.3.  He says he has had on and off abdominal pain after meals for the past 1 month, no prior similar issues. Last drink of alcohol was more than 3 weeks back . Presently feels better but still complains of pain.    Past Medical History:  Diagnosis Date  . ARDS (adult respiratory distress syndrome) ([place])   . ARDS (adult respiratory distress syndrome) ([place])   . Bipolar disorder ([place])   . Borderline diabetes   . COPD (chronic obstructive pulmonary disease) ([facility])    chronic cough and wheezing  . Depression   . Dizziness    medication related  . Dyspnea    with exertion  . Elevated lipids   . Fatty  liver   . Hypercholesterolemia   . Pancreatitis   . Pneumonia    in past  . Pneumonia    in past  . Pre-diabetes    diet controlled  . Pulmonary embolism ([place])   . Schizoaffective disorder [facility])     Past Surgical History:  Procedure Laterality Date  . CATARACT EXTRACTION W/PHACO Right 03/26/2018   Procedure: CATARACT EXTRACTION PHACO AND INTRAOCULAR LENS PLACEMENT ([place]) RIGHT BORDERLINE DIABETIC;  Surgeon: Leandrew Koyanagi, MD;  Location: [facility];  Service: Ophthalmology;  Laterality: Right;  . CATARACT EXTRACTION W/PHACO Left 04/23/2018   Procedure: CATARACT EXTRACTION PHACO AND INTRAOCULAR LENS PLACEMENT ([place])  LEFT BORDERLINE DIABETIC;  Surgeon: Leandrew Koyanagi, MD;  Location: [facility];  Service: Ophthalmology;  Laterality: Left;  . COLONOSCOPY WITH PROPOFOL N/A 08/21/2017   Procedure: COLONOSCOPY WITH PROPOFOL;  Surgeon: Manya Silvas, MD;  Location: [facility] ENDOSCOPY;  Service: Endoscopy;  Laterality: N/A;  . HERNIA REPAIR      Prior to Admission medications   Medication Sig Start Date End Date Taking? Authorizing Provider  albuterol (PROVENTIL HFA;VENTOLIN HFA) 108 (90 Base) MCG/ACT inhaler Inhale 2 puffs into the lungs every 6 (six) hours as needed for wheezing or shortness of breath. 09/25/16  Yes Delman Kitten, MD  ALPRAZolam Duanne Moron) 0.5 MG tablet Take 0.5 mg by mouth 3 (three) times daily. 06/26/18  Yes  [provider]  OLANZapine (ZYPREXA) 15 MG tablet Take 15 mg by mouth at bedtime.   Yes [provider]  sertraline (ZOLOFT) 100 MG tablet Take 100 mg by mouth daily. am   Yes [provider]  simvastatin (ZOCOR) 40 MG tablet Take 40 mg by mouth at bedtime.    Yes [provider]    History reviewed. No pertinent family history.   Social History   Tobacco Use  . Smoking status: Current Every Day Smoker    Packs/day: 1.00    Years: 30.00    Pack years: 30.00    Types: Cigarettes  . Smokeless tobacco:  Former Systems developer    Types: Snuff  Substance Use Topics  . Alcohol use: Yes    Alcohol/week: 4.0 standard drinks    Types: 4 Cans of beer per week    Comment: history of, none since February   . Drug use: Not Currently    Allergies as of 07/02/2018 - Review Complete 07/02/2018  Allergen Reaction Noted  . Klonopin [clonazepam]  06/02/2015  . Morphine and related  06/02/2015  . Pimozide  08/20/2017  . Stelazine [trifluoperazine]  06/02/2015    Review of Systems:    All systems reviewed and negative except where noted in HPI.   Physical Exam:  Vital signs in last 24 hours: Temp:  [98 F (36.7 C)-98.5 F (36.9 C)] 98.5 F (36.9 C) (09/02 0359) Pulse Rate:  [91-97] 97 (09/02 0359) Resp:  [18] 18 (09/02 0359) BP: (147-164)/(82-92) 164/92 (09/02 0359) SpO2:  [93 %-95 %] 93 % (09/02 0359) Last BM Date: 07/07/18 General:   Pleasant, cooperative in NAD Head:  Normocephalic and atraumatic. Eyes:   No icterus.   Conjunctiva pink. PERRLA. Ears:  Normal auditory acuity. Neck:  Supple; no masses or thyroidomegaly Lungs: Respirations even and unlabored. Lungs clear to auscultation bilaterally.   No wheezes, crackles, or rhonchi.  Heart:  Regular rate and rhythm;  Without murmur, clicks, rubs or gallops Abdomen:  Soft, mild distension, nontender. Normal bowel sounds. No appreciable masses or hepatomegaly.  No rebound or guarding.  Neurologic:  Alert and oriented x3;  grossly normal neurologically. Skin:  Intact without significant lesions or rashes. Cervical Nodes:  No significant cervical adenopathy. Psych:  Alert and cooperative. Normal affect.  LAB RESULTS: Recent Labs    07/05/18 0409 07/06/18 0404  WBC 12.3* 13.0*  HGB 14.1 14.1  HCT 41.3 41.2  PLT 103* 126*   BMET Recent Labs    07/05/18 0409 07/06/18 0404  NA 127* 132*  K 3.4* 3.1*  CL 94* 94*  CO2 25 29  GLUCOSE 188* 194*  BUN 9 8  CREATININE 0.56* 0.61  CALCIUM 7.6* 8.0*   LFT Recent Labs    07/06/18 0404    PROT 6.6  ALBUMIN 2.8*  AST 25  ALT 25  ALKPHOS 88  BILITOT 1.5*   PT/INR No results for input(s): LABPROT, INR in the last 72 hours.  STUDIES: Ct Abdomen Pelvis W Contrast  Result Date: 07/06/2018 CLINICAL DATA:  Patient with known pancreatitis.  Continued pain. EXAM: CT ABDOMEN AND PELVIS WITH CONTRAST TECHNIQUE: Multidetector CT imaging of the abdomen and pelvis was performed using the standard protocol following bolus administration of intravenous contrast. CONTRAST:  168mL ISOVUE-300 IOPAMIDOL (ISOVUE-300) INJECTION 61% COMPARISON:  CT abdomen pelvis 07/02/2018 FINDINGS: Lower chest: New small left pleural effusion and adjacent consolidation favored to represent atelectasis. Bilateral patchy ground-glass opacities. Normal heart size. Hepatobiliary: Liver is diffusely low in  attenuation compatible with steatosis. High attenuation within the gallbladder lumen which may represent contrast or sludge. No intrahepatic or extrahepatic biliary ductal dilatation. Pancreas: The pancreas is edematous with peripancreatic fluid and fat stranding. There is hypoenhancement involving the pancreatic tail (image 35; series 2). Spleen: Unremarkable Adrenals/Urinary Tract: Normal adrenal glands. Kidneys enhance symmetrically with contrast. No hydronephrosis. Urinary bladder is unremarkable. Punctate stone inferior pole left kidney. Stomach/Bowel: Normal morphology of the stomach. No evidence for small bowel obstruction. Small volume free fluid within the central small bowel mesentery and within the pericolic gutters, increased from prior. Vascular/Lymphatic: Normal caliber abdominal aorta. Peripheral calcified atherosclerotic plaque. No retroperitoneal lymphadenopathy. Reproductive: Prostate unremarkable. Other: None. Musculoskeletal: No aggressive or acute appearing osseous lesions. IMPRESSION: Relative hypoenhancement of the pancreatic tail concerning for distal pancreatic necrosis in the setting of acute  pancreatitis. Interval increase in peripancreatic fluid and fat stranding. Interval increase in fluid within the mesentery and pericolic gutters. New small left pleural effusion and underlying opacities favored to represent atelectasis. Hepatic steatosis. Electronically Signed   By: Lovey Newcomer M.D.   On: 07/06/2018 15:24      Impression / Plan:   JAMIE BELGER is a 52 y.o. y/o male admitted with acute severe pancreatitis, acute necrosis ([place]) (Bisap1) etiology is possibly gallstones as with an acute rise in his transaminases on admission with rapid resolution within a few days , similar pattern seen in 06/2014  There is no dilation of the intrahepatic or extrahepatic  Ducts. Denies any alcohol last 3 weeks.  There is evidence of sludge in the gallbladder on the initial CT scan .  At this point of time he has developed pancreatic necrosis of the tail.  There is no clinical signs of infection as he has been afebrile and white cell count has not been rising.  The pancreatic necrosis by itself can precipitate a leukocytosis.At this point of time I see no role for any drainage of collections or any surgical intervention.   Plan:  1. supportive care including IV fluids which I would titrate based on urine output and clinical status, Ringer lactate would be the IV fluid of choice.  At present his chloride is low at 94 and his creatinine is up since yesterday may benefit from addition IV fluids from his maintanance.    2. Maintain blood sugars less than 180 mg/dL.    3. Analgesia   4.  If he develops worsening of leukocytosis, fever, deteriorates clinically  then it would be an indication to commence on antibiotics and at that point of time a Carbapenem would be the drug of choice.   5.  Endoscopy transgastric drainage is not possible at this point of time unless cyst wall develops which is not been seen on the last CT scan.  If clinically worsens in 24 to 48 hours further imaging would be required.     6. In terms of diet enteral route of choice and with the patient feels ready will start on clears and gradually advance.   7.  Check fractionated bilirubin and if it is predominantly direct hyperbilirubinemia then an MRCP would be indicated to rule out stones in the common bile duct.  Check triglyceride and IgG4 levels.  8. Depending on what surgery feels, he would require a cholecystectomy at some point of time.  This is his third episode of pancreatitis, no recent alcohol consumption per history and normal blood levels on admission.  .  10. Stop all alcohol  Thank you for  involving me in the care of this patient.      LOS: 5 days   Shaun Bellows, MD  07/07/2018, 11:33 AM

## 2018-07-07 NOTE — Progress Notes (Signed)
Pain meds adjusted by MD. Parents verbalize concern over abuse/addition of controlled pain meds in past and requesting to limit use;"he will tell you he is having pain just to get the pain meds."  Tolerated clear liquid diet well with no reported nausea/vomiting. CIWA negative. Labs reviewed. Encouraged pt to get up and ambulated in room/hall/ sit in chair.

## 2018-07-07 NOTE — Progress Notes (Signed)
Parents at bedside. Mother reports pt has been sleeping and snoring at intervals. Pt asking about pain med. Xanax given. Pt plans to take norco at next scheduled time. Refused offers to walk; get OOB.

## 2018-07-08 DIAGNOSIS — K8511 Biliary acute pancreatitis with uninfected necrosis: Secondary | ICD-10-CM

## 2018-07-08 MED ORDER — LORAZEPAM 1 MG PO TABS: 1.0000 mg | ORAL_TABLET | Freq: Four times a day (QID) | ORAL | Status: DC | PRN

## 2018-07-08 MED ORDER — KETOROLAC TROMETHAMINE 30 MG/ML IJ SOLN
15.0000 mg | Freq: Four times a day (QID) | INTRAMUSCULAR | Status: DC | PRN
  Administered 2018-07-08 (×2): 15 mg via INTRAVENOUS
  Filled 2018-07-08 (×2): qty 1

## 2018-07-08 MED ORDER — LORAZEPAM 2 MG PO TABS: 0.0000 mg | ORAL_TABLET | Freq: Four times a day (QID) | ORAL | Status: DC

## 2018-07-08 MED ORDER — THIAMINE HCL 100 MG/ML IJ SOLN
100.0000 mg | Freq: Every day | INTRAMUSCULAR | Status: DC
  Filled 2018-07-08 (×2): qty 1

## 2018-07-08 MED ORDER — LORAZEPAM 2 MG/ML IJ SOLN: 1.0000 mg | Freq: Four times a day (QID) | INTRAMUSCULAR | Status: DC | PRN

## 2018-07-08 MED ORDER — VITAMIN B-1 100 MG PO TABS
100.0000 mg | ORAL_TABLET | Freq: Every day | ORAL | Status: DC
  Administered 2018-07-09: 10:00:00 100 mg via ORAL
  Filled 2018-07-08: qty 1

## 2018-07-08 MED ORDER — FOLIC ACID 1 MG PO TABS
1.0000 mg | ORAL_TABLET | Freq: Every day | ORAL | Status: DC
  Administered 2018-07-09: 1 mg via ORAL

## 2018-07-08 MED ORDER — ADULT MULTIVITAMIN W/MINERALS CH
1.0000 | ORAL_TABLET | Freq: Every day | ORAL | Status: DC
  Administered 2018-07-09: 10:00:00 1 via ORAL
  Filled 2018-07-08: qty 1

## 2018-07-08 MED ORDER — LORAZEPAM 2 MG PO TABS: 0.0000 mg | ORAL_TABLET | Freq: Two times a day (BID) | ORAL | Status: DC

## 2018-07-08 NOTE — Plan of Care (Signed)
  Problem: Clinical Measurements: Goal: Ability to maintain clinical measurements within normal limits will improve Outcome: Progressing   

## 2018-07-08 NOTE — Progress Notes (Signed)
[facility] [place] NAME: Shaun Odonnell    MR#:  [id number]  DATE OF BIRTH:  [birth year]  SUBJECTIVE:   Patient is reporting that Dr. Vicente Males told him that he might need fentanyl for his pain. Patient has no pain when eating. However reports abdominal pain. No nausea or vomiting.  Patient has had bowel movement.  REVIEW OF SYSTEMS:    Review of Systems  Constitutional: Negative for fever, chills weight loss HENT: Negative for ear pain, nosebleeds, congestion, facial swelling, rhinorrhea, neck pain, neck stiffness and ear discharge.   Respiratory: Negative for cough, shortness of breath, wheezing  Cardiovascular: Negative for chest pain, palpitations and leg swelling.  Gastrointestinal: Negative for heartburn, ++abdominal pain, NO vomiting, diarrhea or consitpation Genitourinary: Negative for dysuria, urgency, frequency, hematuria Musculoskeletal: Negative for back pain or joint pain Neurological: Negative for dizziness, seizures, syncope, focal weakness,  numbness and headaches.  Hematological: Does not bruise/bleed easily.  Psychiatric/Behavioral: Negative for hallucinations, confusion, dysphoric mood    Tolerating Diet: yes      DRUG ALLERGIES:   Allergies  Allergen Reactions  . Klonopin [Clonazepam]   . Morphine And Related   . Pimozide   . Stelazine [Trifluoperazine]     VITALS:  Blood pressure 135/79, pulse 82, temperature 97.6 F (36.4 C), temperature source Oral, resp. rate 20, height 5\' 8"  (1.727 m), weight 102.1 kg, SpO2 97 %.  PHYSICAL EXAMINATION:  Constitutional: Appears well-developed and well-nourished. No distress. HENT: Normocephalic. Marland Kitchen Oropharynx is clear and moist.  Eyes: Conjunctivae and EOM are normal. PERRLA, no scleral icterus.  Neck: Normal ROM. Neck supple. No JVD. No tracheal deviation. CVS: RRR, S1/S2 +, no murmurs, no gallops, no carotid bruit.  Pulmonary: Effort and breath sounds normal, no  stridor, rhonchi, wheezes, rales.  Abdominal: Soft. BS +,  no distension, tenderness, rebound or guarding.  Musculoskeletal: Normal range of motion. No edema and no tenderness.  Neuro: Alert. CN 2-12 grossly intact. No focal deficits. Skin: Skin is warm and dry. No rash noted. Psychiatric: Normal mood and affect.      LABORATORY PANEL:   CBC Recent Labs  Lab 07/06/18 0404  WBC 13.0*  HGB 14.1  HCT 41.2  PLT 126*   ------------------------------------------------------------------------------------------------------------------  Chemistries  Recent Labs  Lab 07/06/18 0404 07/07/18 1312  NA 132*  --   K 3.1*  --   CL 94*  --   CO2 29  --   GLUCOSE 194*  --   BUN 8  --   CREATININE 0.61  --   CALCIUM 8.0*  --   AST 25  --   ALT 25  --   ALKPHOS 88  --   BILITOT 1.5* 1.7*   ------------------------------------------------------------------------------------------------------------------  Cardiac Enzymes Recent Labs  Lab 07/02/18 0754  TROPONINI <0.03   ------------------------------------------------------------------------------------------------------------------  RADIOLOGY:  Ct Abdomen Pelvis W Contrast  Result Date: 07/06/2018 CLINICAL DATA:  Patient with known pancreatitis.  Continued pain. EXAM: CT ABDOMEN AND PELVIS WITH CONTRAST TECHNIQUE: Multidetector CT imaging of the abdomen and pelvis was performed using the standard protocol following bolus administration of intravenous contrast. CONTRAST:  137mL ISOVUE-300 IOPAMIDOL (ISOVUE-300) INJECTION 61% COMPARISON:  CT abdomen pelvis 07/02/2018 FINDINGS: Lower chest: New small left pleural effusion and adjacent consolidation favored to represent atelectasis. Bilateral patchy ground-glass opacities. Normal heart size. Hepatobiliary: Liver is diffusely low in attenuation compatible with steatosis. High attenuation within the gallbladder lumen which may represent contrast or sludge. No intrahepatic or extrahepatic  biliary ductal dilatation. Pancreas: The pancreas is edematous with peripancreatic fluid and fat stranding. There is hypoenhancement involving the pancreatic tail (image 35; series 2). Spleen: Unremarkable Adrenals/Urinary Tract: Normal adrenal glands. Kidneys enhance symmetrically with contrast. No hydronephrosis. Urinary bladder is unremarkable. Punctate stone inferior pole left kidney. Stomach/Bowel: Normal morphology of the stomach. No evidence for small bowel obstruction. Small volume free fluid within the central small bowel mesentery and within the pericolic gutters, increased from prior. Vascular/Lymphatic: Normal caliber abdominal aorta. Peripheral calcified atherosclerotic plaque. No retroperitoneal lymphadenopathy. Reproductive: Prostate unremarkable. Other: None. Musculoskeletal: No aggressive or acute appearing osseous lesions. IMPRESSION: Relative hypoenhancement of the pancreatic tail concerning for distal pancreatic necrosis in the setting of acute pancreatitis. Interval increase in peripancreatic fluid and fat stranding. Interval increase in fluid within the mesentery and pericolic gutters. New small left pleural effusion and underlying opacities favored to represent atelectasis. Hepatic steatosis. Electronically Signed   By: Lovey Newcomer M.D.   On: 07/06/2018 15:24     ASSESSMENT AND PLAN:   52 year old male with a history of EtOH abuse and pancreatitis who presents with abdominal pain.  1.  Acute pancreatitis with necrosis but no signs of infection: Continue pain medications as needed and supportive treatment. Advance diet to low-fat diet as patient is tolerating clear liquid diet. As per surgery no indication for cholecystectomy since patient has no stone on multiple ultrasounds and MRI. No indication for antibiotics at this time as per GI and surgery.   2.  EtOH abuse: CIWA protocol  3.Tobacco dependence: Patient is encouraged to quit smoking. Counseling was provided for 4  minutes.  4.  Bipolar disorder: Continue Zoloft and Zyprexa  5.  COPD without signs of exacerbation  Management plans discussed with the patient and he is in agreement.  CODE STATUS: full  TOTAL TIME TAKING CARE OF THIS PATIENT: 27 minutes.     POSSIBLE D/C 1-2 days, DEPENDING ON CLINICAL CONDITION.   Taje Tondreau M.D on 07/08/2018 at 11:11 AM  Between 7am to 6pm - Pager - [phone] After 6pm go to [email or web address] - password [email or web address] [facility]  Office  [phone]  CC: Primary care physician; Kirk Ruths, MD  Note: This dictation was prepared with Dragon dictation along with smaller phrase technology. Any transcriptional errors that result from this process are unintentional.

## 2018-07-08 NOTE — Progress Notes (Signed)
Vonda Antigua, MD [address], [place], [place], [place], [address] [address], [place], [place], [place], [address] Phone: [phone]  Fax: [phone]   Subjective:  Patient reports continued abdominal pain.  Diffuse, nonradiating, sharp, varies from 5-8 over 10.  No nausea or vomiting.  Objective: Exam: Vital signs in last 24 hours: Vitals:   07/07/18 1713 07/07/18 1715 07/07/18 2019 07/08/18 0514  BP: (!) 172/87 (!) 164/87 (!) 150/79 135/79  Pulse: 96 96 95 82  Resp: 20 20 19 20   Temp: 98.6 F (37 C)  98 F (36.7 C) 97.6 F (36.4 C)  TempSrc: Oral  Oral Oral  SpO2: 92% 93% 94% 97%  Weight:      Height:       Weight change:   Intake/Output Summary (Last 24 hours) at 07/08/2018 6195 Last data filed at 07/08/2018 0515 Gross per 24 hour  Intake 1479.6 ml  Output 3250 ml  Net -1770.4 ml    General: No acute distress, AAO x3 Abd: Soft, NT/ND, No HSM Skin: Warm, no rashes Neck: Supple, Trachea midline   Lab Results: Lab Results  Component Value Date   WBC 13.0 (H) 07/06/2018   HGB 14.1 07/06/2018   HCT 41.2 07/06/2018   MCV 98.1 07/06/2018   PLT 126 (L) 07/06/2018   Micro Results: No results found for this or any previous visit (from the past 240 hour(s)). Studies/Results: Ct Abdomen Pelvis W Contrast  Result Date: 07/06/2018 CLINICAL DATA:  Patient with known pancreatitis.  Continued pain. EXAM: CT ABDOMEN AND PELVIS WITH CONTRAST TECHNIQUE: Multidetector CT imaging of the abdomen and pelvis was performed using the standard protocol following bolus administration of intravenous contrast. CONTRAST:  139mL ISOVUE-300 IOPAMIDOL (ISOVUE-300) INJECTION 61% COMPARISON:  CT abdomen pelvis 07/02/2018 FINDINGS: Lower chest: New small left pleural effusion and adjacent consolidation favored to represent atelectasis. Bilateral patchy ground-glass opacities. Normal heart size. Hepatobiliary: Liver is diffusely low in attenuation compatible with steatosis. High  attenuation within the gallbladder lumen which may represent contrast or sludge. No intrahepatic or extrahepatic biliary ductal dilatation. Pancreas: The pancreas is edematous with peripancreatic fluid and fat stranding. There is hypoenhancement involving the pancreatic tail (image 35; series 2). Spleen: Unremarkable Adrenals/Urinary Tract: Normal adrenal glands. Kidneys enhance symmetrically with contrast. No hydronephrosis. Urinary bladder is unremarkable. Punctate stone inferior pole left kidney. Stomach/Bowel: Normal morphology of the stomach. No evidence for small bowel obstruction. Small volume free fluid within the central small bowel mesentery and within the pericolic gutters, increased from prior. Vascular/Lymphatic: Normal caliber abdominal aorta. Peripheral calcified atherosclerotic plaque. No retroperitoneal lymphadenopathy. Reproductive: Prostate unremarkable. Other: None. Musculoskeletal: No aggressive or acute appearing osseous lesions. IMPRESSION: Relative hypoenhancement of the pancreatic tail concerning for distal pancreatic necrosis in the setting of acute pancreatitis. Interval increase in peripancreatic fluid and fat stranding. Interval increase in fluid within the mesentery and pericolic gutters. New small left pleural effusion and underlying opacities favored to represent atelectasis. Hepatic steatosis. Electronically Signed   By: Lovey Newcomer M.D.   On: 07/06/2018 15:24   Medications:  Scheduled Meds: . ALPRAZolam  0.5 mg Oral TID  . enoxaparin (LOVENOX) injection  40 mg Subcutaneous Q24H  . folic acid  1 mg Oral Daily  . multivitamin with minerals  1 tablet Oral Daily  . nicotine  14 mg Transdermal Daily  . OLANZapine  15 mg Oral QHS  . sertraline  100 mg Oral Daily  . thiamine  100 mg Oral Daily   Or  .  thiamine  100 mg Intravenous Daily   Continuous Infusions: . sodium chloride 75 mL/hr at 07/08/18 0111   PRN Meds:.acetaminophen **OR** acetaminophen, albuterol,  HYDROcodone-acetaminophen, HYDROmorphone (DILAUDID) injection, ondansetron (ZOFRAN) IV   Assessment: Active Problems:   Pancreatitis    Plan: See Dr. Georgeann Oppenheim detailed consult note Patient remains afebrile with no signs of infection Low threshold for starting gram-negative antibiotics if patient develops fevers or signs of infection Recommend checking white blood cell count today or tomorrow, as has not been checked in 2 days  Would recommend better pain control, as patient states the oral pain medication does not relieve his pain at all, versus the Dilaudid injectable does Would recommend changing Dilaudid to every 4 hours as needed for the next 1 to 2 days until pain improves, as patient has severe pancreatitis with necrosis  Better pain control may allow patient to eat better as well, and enteral nutrition is also important in the setting of pancreatitis start with clear liquid diet once patient tolerates, and then advance to low-fat diet after 1 to 2 days Titrate IV fluids/lactated Ringer's based on urine output, and oral intake  If patient clinically worsens, patient will need repeat CT scan  Bilirubin fractionation, revealed predominantly indirect bilirubin Triglycerides normal IgG4 pending  Patient will need cholecystectomy, timing of which to be determined by surgery   LOS: 6 days   Vonda Antigua, MD 07/08/2018, 9:37 AM

## 2018-07-08 NOTE — Progress Notes (Signed)
Pt found sitting on the floor outside the room.  Pt states that he is "due his pain medication".  Explained to pt for the third time tonight that it is as needed and he must call for the pain medication.  I will not just bring it every 6 hours.  Pt states that "it is due at 5:18 and he wants it now".  Explained to pt that he would need to return to his room, that I would not give him medication in the hall.  Pt will not return to room until he sees the pain medication.  Took pills and showed them to pt and helped him back to room/bed.  Educated pt that his pain medication is going to be discontinued when he is discharged and that he should try to decrease the use so he does not have withdrawals when he gets home.  Suggested that pt take one pill at this time as he is moving, drinking, able to walk out and sit on the floor.  Pt states he wants both now.  Gave pt 2 pain pills. Pt not receptive to education. Dorna Bloom RN

## 2018-07-09 LAB — CBC
HCT: 36.7 % — ABNORMAL LOW (ref 40.0–52.0)
HEMOGLOBIN: 12.7 g/dL — AB (ref 13.0–18.0)
MCH: 33.3 pg (ref 26.0–34.0)
MCHC: 34.7 g/dL (ref 32.0–36.0)
MCV: 96 fL (ref 80.0–100.0)
PLATELETS: 212 10*3/uL (ref 150–440)
RBC: 3.82 MIL/uL — AB (ref 4.40–5.90)
RDW: 14.2 % (ref 11.5–14.5)
WBC: 11 10*3/uL — ABNORMAL HIGH (ref 3.8–10.6)

## 2018-07-09 LAB — IGG 4: IgG, Subclass 4: 12 mg/dL (ref 2–96)

## 2018-07-09 LAB — BASIC METABOLIC PANEL
ANION GAP: 9 (ref 5–15)
BUN: 8 mg/dL (ref 6–20)
CHLORIDE: 97 mmol/L — AB (ref 98–111)
CO2: 30 mmol/L (ref 22–32)
Calcium: 7.5 mg/dL — ABNORMAL LOW (ref 8.9–10.3)
Creatinine, Ser: 0.43 mg/dL — ABNORMAL LOW (ref 0.61–1.24)
GFR calc Af Amer: >60 mL/min (ref >60)
GLUCOSE: 290 mg/dL — AB (ref 70–99)
POTASSIUM: 2.6 mmol/L — AB (ref 3.5–5.1)
Sodium: 136 mmol/L (ref 135–145)

## 2018-07-09 LAB — MAGNESIUM: Magnesium: 2.3 mg/dL (ref 1.7–2.4)

## 2018-07-09 MED ORDER — NICOTINE 14 MG/24HR TD PT24: 14.0000 mg | MEDICATED_PATCH | Freq: Every day | TRANSDERMAL | 0 refills | Status: DC

## 2018-07-09 MED ORDER — POTASSIUM CHLORIDE 10 MEQ/100ML IV SOLN
10.0000 meq | INTRAVENOUS | Status: AC
  Administered 2018-07-09 (×4): 10 meq via INTRAVENOUS
  Filled 2018-07-09 (×4): qty 100

## 2018-07-09 MED ORDER — POTASSIUM CHLORIDE CRYS ER 20 MEQ PO TBCR
40.0000 meq | EXTENDED_RELEASE_TABLET | Freq: Once | ORAL | Status: AC
  Administered 2018-07-09: 07:00:00 40 meq via ORAL
  Filled 2018-07-09: qty 2

## 2018-07-09 NOTE — Progress Notes (Addendum)
   Vonda Antigua, MD [address], [place], [place], [place], [address] [address], [place], [place], [place], [address] Phone: [phone]  Fax: [phone]   Subjective: Abdominal pain improved.  Patient tolerated solid food diet this morning without difficulty.   Objective: Exam: Vital signs in last 24 hours: Vitals:   07/08/18 1509 07/08/18 1958 07/09/18 0432 07/09/18 0500  BP: (!) 149/82 (!) 144/77 (!) 164/79   Pulse: 76 81 95   Resp: 18 14 18    Temp: 97.9 F (36.6 C) 98.5 F (36.9 C) 98.2 F (36.8 C)   TempSrc: Oral Oral Oral   SpO2: 97% 96% (!) 86% 93%  Weight:      Height:       Weight change:   Intake/Output Summary (Last 24 hours) at 07/09/2018 0926 Last data filed at 07/09/2018 0112 Gross per 24 hour  Intake 1781.5 ml  Output 1425 ml  Net 356.5 ml    General: No acute distress, AAO x3 Abd: Soft, NT/ND, No HSM Skin: Warm, no rashes Neck: Supple, Trachea midline   Lab Results: Lab Results  Component Value Date   WBC 11.0 (H) 07/09/2018   HGB 12.7 (L) 07/09/2018   HCT 36.7 (L) 07/09/2018   MCV 96.0 07/09/2018   PLT 212 07/09/2018   Micro Results: No results found for this or any previous visit (from the past 240 hour(s)). Studies/Results: No results found. Medications:  Scheduled Meds: . ALPRAZolam  0.5 mg Oral TID  . enoxaparin (LOVENOX) injection  40 mg Subcutaneous Q24H  . folic acid  1 mg Oral Daily  . folic acid  1 mg Oral Daily  . LORazepam  0-4 mg Oral Q6H   Followed by  . [START ON 07/10/2018] LORazepam  0-4 mg Oral Q12H  . multivitamin with minerals  1 tablet Oral Daily  . multivitamin with minerals  1 tablet Oral Daily  . nicotine  14 mg Transdermal Daily  . OLANZapine  15 mg Oral QHS  . sertraline  100 mg Oral Daily  . thiamine  100 mg Oral Daily   Or  . thiamine  100 mg Intravenous Daily  . thiamine  100 mg Oral Daily   Or  . thiamine  100 mg Intravenous Daily   Continuous Infusions: . sodium chloride 75 mL/hr at  07/09/18 0517  . potassium chloride 10 mEq (07/09/18 0821)   PRN Meds:.acetaminophen **OR** acetaminophen, albuterol, HYDROcodone-acetaminophen, ketorolac, LORazepam **OR** LORazepam, ondansetron (ZOFRAN) IV   Assessment: Active Problems:   Pancreatitis    Plan: Continue IV fluids and pain control Advance to low-fat diet if tolerated, and patient should be on low-fat diet for the next 2 to 3 weeks No signs of infection at this time Continue daily abdominal exams and clinical observance  Replace electrolytes Low K today Primary team to replace and recheck as necessary  Patient should follow-up in primary care clinic closely, within 1 to 2 weeks of discharge and with surgery closely and with GI clinic upon discharge as well      LOS: 7 days   Vonda Antigua, MD 07/09/2018, 9:26 AM

## 2018-07-09 NOTE — Discharge Summary (Addendum)
[facility] [place] NAME: Shaun Odonnell    MR#:  [id number]  DATE OF BIRTH:  [birth year]  DATE OF ADMISSION:  07/02/2018 ADMITTING PHYSICIAN: Bettey Costa, MD  DATE OF DISCHARGE: 07/09/2018  PRIMARY CARE PHYSICIAN: Kirk Ruths, MD    ADMISSION DIAGNOSIS:  Alcohol-induced acute pancreatitis, unspecified complication status [W09.81]  DISCHARGE DIAGNOSIS:  Active Problems:   Pancreatitis   SECONDARY DIAGNOSIS:   Past Medical History:  Diagnosis Date  . ARDS (adult respiratory distress syndrome) ([facility])   . ARDS (adult respiratory distress syndrome) ([facility])   . Bipolar disorder ([facility])   . Borderline diabetes   . COPD (chronic obstructive pulmonary disease) ([facility])    chronic cough and wheezing  . Depression   . Dizziness    medication related  . Dyspnea    with exertion  . Elevated lipids   . Fatty liver   . Hypercholesterolemia   . Pancreatitis   . Pneumonia    in past  . Pneumonia    in past  . Pre-diabetes    diet controlled  . Pulmonary embolism ([place])   . Schizoaffective disorder [facility])     HOSPITAL COURSE:   52 year old male with a history of EtOH abuse and pancreatitis who presents with abdominal pain.  1.  Acute pancreatitis with necrosis CT scan but no signs of infection: Patient was treated with acute pancreatitis with IV fluids, n.p.o. status and pain control with antiemetics.  His pain is completely improved and resolved.  He is tolerating a soft diet.  He was eval by GI and surgery due to the necrosis seen on CT scan. As per surgery no indication for cholecystectomy since patient has no stone on multiple ultrasounds and MRI. No indication for antibiotics at this time as per GI and surgery. We will follow-up with GI and surgery in 1 week.  2.  EtOH abuse: She had uneventful detox on CIWA protocol  3.Tobacco dependence: Patient is encouraged to quit smoking. Counseling was provided for 4 minutes.  4.   Bipolar disorder: He will continue patient medications.5.  COPD without signs of exacerbation 5.  Hypokalemia: This was repleted prior to discharge.  DISCHARGE CONDITIONS AND DIET:   Stable for discharge on soft diet  CONSULTS OBTAINED:  Treatment Team:  Jonathon Bellows, MD Herbert Pun, MD  DRUG ALLERGIES:   Allergies  Allergen Reactions  . Klonopin [Clonazepam]   . Morphine And Related   . Pimozide   . Stelazine [Trifluoperazine]     DISCHARGE MEDICATIONS:   Allergies as of 07/09/2018      Reactions   Klonopin [clonazepam]    Morphine And Related    Pimozide    Stelazine [trifluoperazine]       Medication List    TAKE these medications   albuterol 108 (90 Base) MCG/ACT inhaler Commonly known as:  PROVENTIL HFA;VENTOLIN HFA Inhale 2 puffs into the lungs every 6 (six) hours as needed for wheezing or shortness of breath.   ALPRAZolam 0.5 MG tablet Commonly known as:  XANAX Take 0.5 mg by mouth 3 (three) times daily.   nicotine 14 mg/24hr patch Commonly known as:  NICODERM CQ - dosed in mg/24 hours Place 1 patch (14 mg total) onto the skin daily. Start taking on:  07/10/2018   OLANZapine 15 MG tablet Commonly known as:  ZYPREXA Take 15 mg by mouth at bedtime.   sertraline 100 MG tablet Commonly known as:  ZOLOFT Take 100 mg by  mouth daily. am   simvastatin 40 MG tablet Commonly known as:  ZOCOR Take 40 mg by mouth at bedtime.         Today   CHIEF COMPLAINT:   Patient wants to go home today.  He is tired of laying in bed.  He wants to take a shower.  He denies abdominal pain and is tolerating his diet well.   VITAL SIGNS:  Blood pressure (!) 164/79, pulse 95, temperature 98.2 F (36.8 C), temperature source Oral, resp. rate 18, height 5\' 8"  (1.727 m), weight 102.1 kg, SpO2 93 %.   REVIEW OF SYSTEMS:  Review of Systems  Constitutional: Negative.  Negative for chills, fever and malaise/fatigue.  HENT: Negative.  Negative for ear discharge,  ear pain, hearing loss, nosebleeds and sore throat.   Eyes: Negative.  Negative for blurred vision and pain.  Respiratory: Negative.  Negative for cough, hemoptysis, shortness of breath and wheezing.   Cardiovascular: Negative.  Negative for chest pain, palpitations and leg swelling.  Gastrointestinal: Negative.  Negative for abdominal pain, blood in stool, diarrhea, nausea and vomiting.  Genitourinary: Negative.  Negative for dysuria.  Musculoskeletal: Negative.  Negative for back pain.  Skin: Negative.   Neurological: Negative for dizziness, tremors, speech change, focal weakness, seizures and headaches.  Endo/Heme/Allergies: Negative.  Does not bruise/bleed easily.  Psychiatric/Behavioral: Negative.  Negative for depression, hallucinations and suicidal ideas.     PHYSICAL EXAMINATION:  GENERAL:  52 y.o.-year-old patient lying in the bed with no acute distress.  NECK:  Supple, no jugular venous distention. No thyroid enlargement, no tenderness.  LUNGS: Normal breath sounds bilaterally, no wheezing, rales,rhonchi  No use of accessory muscles of respiration.  CARDIOVASCULAR: S1, S2 normal. No murmurs, rubs, or gallops.  ABDOMEN: Soft, non-tender, non-distended. Bowel sounds present. No organomegaly or mass.  EXTREMITIES: No pedal edema, cyanosis, or clubbing.  PSYCHIATRIC: The patient is alert and oriented x 3.  SKIN: No obvious rash, lesion, or ulcer.   DATA REVIEW:   CBC Recent Labs  Lab 07/09/18 0502  WBC 11.0*  HGB 12.7*  HCT 36.7*  PLT 212    Chemistries  Recent Labs  Lab 07/06/18 0404 07/07/18 1312 07/09/18 0502  NA 132*  --  136  K 3.1*  --  2.6*  CL 94*  --  97*  CO2 29  --  30  GLUCOSE 194*  --  290*  BUN 8  --  8  CREATININE 0.61  --  0.43*  CALCIUM 8.0*  --  7.5*  MG  --   --  2.3  AST 25  --   --   ALT 25  --   --   ALKPHOS 88  --   --   BILITOT 1.5* 1.7*  --     Cardiac Enzymes No results for input(s): TROPONINI in the last 168  hours.  Microbiology Results  @MICRORSLT48 @  RADIOLOGY:  No results found.    Allergies as of 07/09/2018      Reactions   Klonopin [clonazepam]    Morphine And Related    Pimozide    Stelazine [trifluoperazine]       Medication List    TAKE these medications   albuterol 108 (90 Base) MCG/ACT inhaler Commonly known as:  PROVENTIL HFA;VENTOLIN HFA Inhale 2 puffs into the lungs every 6 (six) hours as needed for wheezing or shortness of breath.   ALPRAZolam 0.5 MG tablet Commonly known as:  XANAX Take 0.5 mg by mouth 3 (  three) times daily.   nicotine 14 mg/24hr patch Commonly known as:  NICODERM CQ - dosed in mg/24 hours Place 1 patch (14 mg total) onto the skin daily. Start taking on:  07/10/2018   OLANZapine 15 MG tablet Commonly known as:  ZYPREXA Take 15 mg by mouth at bedtime.   sertraline 100 MG tablet Commonly known as:  ZOLOFT Take 100 mg by mouth daily. am   simvastatin 40 MG tablet Commonly known as:  ZOCOR Take 40 mg by mouth at bedtime.          Management plans discussed with the patient and he is in agreement. Stable for discharge home  Patient should follow up with pcp  CODE STATUS:     Code Status Orders  (From admission, onward)         Start     Ordered   07/02/18 1106  Full code  Continuous     07/02/18 1105        Code Status History    This patient has a current code status but no historical code status.      TOTAL TIME TAKING CARE OF THIS PATIENT: 38 minutes.    Note: This dictation was prepared with Dragon dictation along with smaller phrase technology. Any transcriptional errors that result from this process are unintentional.  Courtlynn Holloman M.D on 07/09/2018 at 10:33 AM  Between 7am to 6pm - Pager - [phone] After 6pm go to [email or web address] - password [email or web address] [facility]  Office  [phone]  CC: Primary care physician; Kirk Ruths, MD

## 2018-07-09 NOTE — Progress Notes (Signed)
Pt is d/ced home.  K+ was 2.6 this am. Replaced with 40 mEq IV.  Pt has been tolerating advanced diet.  Pt wanted opioids at d/c and dr didn't provide.  Did get script for nicotine patch.  Reviewed d/c instructions and f/u appts w/patient.  IV removed by nurse tech.  Pt will transport home with mother.

## 2018-07-09 NOTE — Care Management Important Message (Signed)
Important Message  Patient Details  Name: Shaun Odonnell MRN: [id number] Date of Birth: 15-Jan-1966   Medicare Important Message Given:  Yes    Juliann Pulse A Nadyne Gariepy 07/09/2018, 11:00 AM

## 2018-07-10 ENCOUNTER — Telehealth: Payer: Self-pay | Admitting: Gastroenterology

## 2018-07-10 NOTE — Telephone Encounter (Signed)
Inform, I recommended pain meds when he was in the hospital. No pain meds after discharge. He needs follow up on a few weeks

## 2018-07-10 NOTE — Telephone Encounter (Signed)
Pt is calling to speak with Dr. Vicente Males he states he saw  Dr. Vicente Males at [facility] and Dr. Vicente Males told pt he should be on rx Fetnorole?  He is asking for Dr. Vicente Males to prescribe him Pain rx please call pt ( pt has apt to see Dr. Vicente Males 08/13/18)

## 2018-07-15 NOTE — Telephone Encounter (Signed)
Called pt to inform him that Dr. Vicente Males cannot prescribed the pain medication pt is requesting, and that he will need to follow up with Dr. Vicente Males.  LVM to return call

## 2018-07-15 NOTE — Telephone Encounter (Signed)
Pt returned call. He has been notified of Dr. Georgeann Oppenheim instructions and will be returning for follow up appointment.

## 2018-08-13 ENCOUNTER — Encounter

## 2018-08-13 ENCOUNTER — Ambulatory Visit: Payer: Self-pay | Admitting: Gastroenterology

## 2018-12-31 ENCOUNTER — Other Ambulatory Visit: Payer: Self-pay

## 2018-12-31 ENCOUNTER — Emergency Department: Payer: Medicare Other

## 2018-12-31 ENCOUNTER — Inpatient Hospital Stay
Admission: EM | Admit: 2018-12-31 | Discharge: 2019-01-02 | DRG: 440 | Disposition: A | Discharge status: Home / Self Care | Payer: Medicare Other | Attending: Internal Medicine | Admitting: Internal Medicine

## 2018-12-31 DIAGNOSIS — F259 Schizoaffective disorder, unspecified: Secondary | ICD-10-CM | POA: Diagnosis present

## 2018-12-31 DIAGNOSIS — Z86711 Personal history of pulmonary embolism: Secondary | ICD-10-CM | POA: Diagnosis not present

## 2018-12-31 DIAGNOSIS — K852 Alcohol induced acute pancreatitis without necrosis or infection: Secondary | ICD-10-CM | POA: Diagnosis not present

## 2018-12-31 DIAGNOSIS — E875 Hyperkalemia: Secondary | ICD-10-CM | POA: Diagnosis present

## 2018-12-31 DIAGNOSIS — E785 Hyperlipidemia, unspecified: Secondary | ICD-10-CM | POA: Diagnosis present

## 2018-12-31 DIAGNOSIS — K861 Other chronic pancreatitis: Secondary | ICD-10-CM | POA: Diagnosis present

## 2018-12-31 DIAGNOSIS — K760 Fatty (change of) liver, not elsewhere classified: Secondary | ICD-10-CM | POA: Diagnosis present

## 2018-12-31 DIAGNOSIS — F1010 Alcohol abuse, uncomplicated: Secondary | ICD-10-CM | POA: Diagnosis present

## 2018-12-31 DIAGNOSIS — J449 Chronic obstructive pulmonary disease, unspecified: Secondary | ICD-10-CM | POA: Diagnosis present

## 2018-12-31 DIAGNOSIS — Z885 Allergy status to narcotic agent status: Secondary | ICD-10-CM | POA: Diagnosis not present

## 2018-12-31 DIAGNOSIS — Z888 Allergy status to other drugs, medicaments and biological substances status: Secondary | ICD-10-CM

## 2018-12-31 DIAGNOSIS — R7303 Prediabetes: Secondary | ICD-10-CM | POA: Diagnosis present

## 2018-12-31 DIAGNOSIS — K859 Acute pancreatitis without necrosis or infection, unspecified: Secondary | ICD-10-CM

## 2018-12-31 DIAGNOSIS — F17210 Nicotine dependence, cigarettes, uncomplicated: Secondary | ICD-10-CM | POA: Diagnosis present

## 2018-12-31 DIAGNOSIS — E860 Dehydration: Secondary | ICD-10-CM | POA: Diagnosis present

## 2018-12-31 DIAGNOSIS — E7800 Pure hypercholesterolemia, unspecified: Secondary | ICD-10-CM | POA: Diagnosis present

## 2018-12-31 LAB — CBC
HEMATOCRIT: 58.3 % — AB (ref 39.0–52.0)
HEMOGLOBIN: 20 g/dL — AB (ref 13.0–17.0)
MCH: 32.4 pg (ref 26.0–34.0)
MCHC: 34.3 g/dL (ref 30.0–36.0)
MCV: 94.5 fL (ref 80.0–100.0)
Platelets: 197 10*3/uL (ref 150–400)
RBC: 6.17 MIL/uL — ABNORMAL HIGH (ref 4.22–5.81)
RDW: 14.6 % (ref 11.5–15.5)
WBC: 16.9 10*3/uL — AB (ref 4.0–10.5)
nRBC: 0 % (ref 0.0–0.2)

## 2018-12-31 LAB — COMPREHENSIVE METABOLIC PANEL
ALBUMIN: 4.9 g/dL (ref 3.5–5.0)
ALT: 53 U/L — AB (ref 0–44)
AST: 51 U/L — AB (ref 15–41)
Alkaline Phosphatase: 108 U/L (ref 38–126)
Anion gap: 17 — ABNORMAL HIGH (ref 5–15)
BUN: 13 mg/dL (ref 6–20)
CHLORIDE: 98 mmol/L (ref 98–111)
CO2: 20 mmol/L — AB (ref 22–32)
CREATININE: 0.74 mg/dL (ref 0.61–1.24)
Calcium: 9.4 mg/dL (ref 8.9–10.3)
GFR calc Af Amer: >60 mL/min (ref >60)
GFR calc non Af Amer: >60 mL/min (ref >60)
Glucose, Bld: 216 mg/dL — ABNORMAL HIGH (ref 70–99)
POTASSIUM: 5.4 mmol/L — AB (ref 3.5–5.1)
SODIUM: 135 mmol/L (ref 135–145)
Total Bilirubin: 1.3 mg/dL — ABNORMAL HIGH (ref 0.3–1.2)
Total Protein: 8 g/dL (ref 6.5–8.1)

## 2018-12-31 LAB — URINALYSIS, COMPLETE (UACMP) WITH MICROSCOPIC
BACTERIA UA: NONE SEEN
BILIRUBIN URINE: NEGATIVE
Glucose, UA: >=500 mg/dL — AB
KETONES UR: 80 mg/dL — AB
LEUKOCYTE UA: NEGATIVE
NITRITE: NEGATIVE
Protein, ur: NEGATIVE mg/dL
SQUAMOUS EPITHELIAL / LPF: NONE SEEN (ref 0–5)
pH: 5 (ref 5.0–8.0)

## 2018-12-31 LAB — HEMOGLOBIN A1C
Hgb A1c MFr Bld: 7.5 % — ABNORMAL HIGH (ref 4.8–5.6)
Mean Plasma Glucose: 168.55 mg/dL

## 2018-12-31 LAB — LIPASE, BLOOD: LIPASE: 1059 U/L — AB (ref 11–51)

## 2018-12-31 LAB — MAGNESIUM: Magnesium: 2 mg/dL (ref 1.7–2.4)

## 2018-12-31 MED ORDER — ONDANSETRON HCL 4 MG PO TABS: 4.0000 mg | ORAL_TABLET | Freq: Four times a day (QID) | ORAL | Status: DC | PRN

## 2018-12-31 MED ORDER — ACETAMINOPHEN 325 MG PO TABS: 650.0000 mg | ORAL_TABLET | Freq: Four times a day (QID) | ORAL | Status: DC | PRN

## 2018-12-31 MED ORDER — SERTRALINE HCL 50 MG PO TABS
100.0000 mg | ORAL_TABLET | Freq: Every day | ORAL | Status: DC
  Administered 2019-01-01 – 2019-01-02 (×2): 100 mg via ORAL
  Filled 2018-12-31 (×2): qty 2

## 2018-12-31 MED ORDER — SIMVASTATIN 20 MG PO TABS
40.0000 mg | ORAL_TABLET | Freq: Every day | ORAL | Status: DC
  Administered 2018-12-31 – 2019-01-01 (×2): 40 mg via ORAL
  Filled 2018-12-31: qty 2
  Filled 2018-12-31: qty 4
  Filled 2018-12-31: qty 2

## 2018-12-31 MED ORDER — ALBUTEROL SULFATE (2.5 MG/3ML) 0.083% IN NEBU: 2.5000 mg | INHALATION_SOLUTION | RESPIRATORY_TRACT | Status: DC | PRN

## 2018-12-31 MED ORDER — NICOTINE 14 MG/24HR TD PT24
14.0000 mg | MEDICATED_PATCH | Freq: Every day | TRANSDERMAL | Status: DC
  Administered 2018-12-31 – 2019-01-02 (×3): 14 mg via TRANSDERMAL
  Filled 2018-12-31 (×3): qty 1

## 2018-12-31 MED ORDER — ONDANSETRON HCL 4 MG/2ML IJ SOLN
4.0000 mg | Freq: Once | INTRAMUSCULAR | Status: AC
  Administered 2018-12-31: 4 mg via INTRAVENOUS
  Filled 2018-12-31: qty 2

## 2018-12-31 MED ORDER — FOLIC ACID 1 MG PO TABS
1.0000 mg | ORAL_TABLET | Freq: Every day | ORAL | Status: DC
  Administered 2018-12-31 – 2019-01-02 (×3): 1 mg via ORAL
  Filled 2018-12-31 (×3): qty 1

## 2018-12-31 MED ORDER — FENTANYL CITRATE (PF) 100 MCG/2ML IJ SOLN
INTRAMUSCULAR | Status: AC
  Administered 2018-12-31: 50 ug via INTRAVENOUS
  Filled 2018-12-31: qty 2

## 2018-12-31 MED ORDER — ACETAMINOPHEN 650 MG RE SUPP: 650.0000 mg | Freq: Four times a day (QID) | RECTAL | Status: DC | PRN

## 2018-12-31 MED ORDER — HYDROMORPHONE HCL 1 MG/ML IJ SOLN
0.5000 mg | INTRAMUSCULAR | Status: DC | PRN
  Administered 2018-12-31 (×2): 0.5 mg via INTRAVENOUS
  Filled 2018-12-31 (×2): qty 1

## 2018-12-31 MED ORDER — ALPRAZOLAM 0.5 MG PO TABS
0.5000 mg | ORAL_TABLET | Freq: Four times a day (QID) | ORAL | Status: DC
  Administered 2018-12-31 – 2019-01-01 (×4): 0.5 mg via ORAL
  Filled 2018-12-31 (×4): qty 1

## 2018-12-31 MED ORDER — HYDROMORPHONE HCL 1 MG/ML IJ SOLN
1.0000 mg | Freq: Once | INTRAMUSCULAR | Status: AC
  Administered 2018-12-31: 1 mg via INTRAVENOUS
  Filled 2018-12-31: qty 1

## 2018-12-31 MED ORDER — SODIUM CHLORIDE 0.9 % IV BOLUS
1000.0000 mL | Freq: Once | INTRAVENOUS | Status: AC
  Administered 2018-12-31: 1000 mL via INTRAVENOUS

## 2018-12-31 MED ORDER — THIAMINE HCL 100 MG/ML IJ SOLN
100.0000 mg | Freq: Every day | INTRAMUSCULAR | Status: DC
  Filled 2018-12-31: qty 2

## 2018-12-31 MED ORDER — ONDANSETRON HCL 4 MG/2ML IJ SOLN
4.0000 mg | Freq: Four times a day (QID) | INTRAMUSCULAR | Status: DC | PRN
  Administered 2018-12-31 – 2019-01-02 (×7): 4 mg via INTRAVENOUS
  Filled 2018-12-31 (×7): qty 2

## 2018-12-31 MED ORDER — SODIUM CHLORIDE 0.9 % IV SOLN
INTRAVENOUS | Status: DC
  Administered 2018-12-31 – 2019-01-02 (×6): via INTRAVENOUS

## 2018-12-31 MED ORDER — IOHEXOL 300 MG/ML  SOLN
100.0000 mL | Freq: Once | INTRAMUSCULAR | Status: AC | PRN
  Administered 2018-12-31: 100 mL via INTRAVENOUS

## 2018-12-31 MED ORDER — OLANZAPINE 5 MG PO TABS
15.0000 mg | ORAL_TABLET | Freq: Every day | ORAL | Status: DC
  Administered 2018-12-31 – 2019-01-01 (×2): 15 mg via ORAL
  Filled 2018-12-31: qty 3

## 2018-12-31 MED ORDER — FENTANYL CITRATE (PF) 100 MCG/2ML IJ SOLN
50.0000 ug | Freq: Once | INTRAMUSCULAR | Status: AC
  Administered 2018-12-31: 50 ug via INTRAVENOUS

## 2018-12-31 MED ORDER — ADULT MULTIVITAMIN W/MINERALS CH
1.0000 | ORAL_TABLET | Freq: Every day | ORAL | Status: DC
  Administered 2018-12-31 – 2019-01-02 (×3): 1 via ORAL
  Filled 2018-12-31 (×3): qty 1

## 2018-12-31 MED ORDER — ONDANSETRON HCL 4 MG/2ML IJ SOLN
4.0000 mg | Freq: Once | INTRAMUSCULAR | Status: AC | PRN
  Administered 2018-12-31: 4 mg via INTRAVENOUS
  Filled 2018-12-31: qty 2

## 2018-12-31 MED ORDER — ENOXAPARIN SODIUM 40 MG/0.4ML ~~LOC~~ SOLN
40.0000 mg | SUBCUTANEOUS | Status: DC
  Administered 2018-12-31 – 2019-01-01 (×2): 40 mg via SUBCUTANEOUS
  Filled 2018-12-31 (×2): qty 0.4

## 2018-12-31 MED ORDER — FENTANYL CITRATE (PF) 100 MCG/2ML IJ SOLN
50.0000 ug | Freq: Once | INTRAMUSCULAR | Status: AC
  Administered 2018-12-31: 50 ug via INTRAVENOUS
  Filled 2018-12-31: qty 2

## 2018-12-31 MED ORDER — KETOROLAC TROMETHAMINE 30 MG/ML IJ SOLN
15.0000 mg | Freq: Once | INTRAMUSCULAR | Status: AC
  Administered 2018-12-31: 15 mg via INTRAVENOUS
  Filled 2018-12-31: qty 1

## 2018-12-31 MED ORDER — HYDROMORPHONE HCL 1 MG/ML IJ SOLN
1.0000 mg | INTRAMUSCULAR | Status: DC | PRN
  Administered 2018-12-31 – 2019-01-01 (×4): 1 mg via INTRAVENOUS
  Filled 2018-12-31 (×4): qty 1

## 2018-12-31 MED ORDER — SODIUM CHLORIDE 0.9 % IV SOLN
Freq: Once | INTRAVENOUS | Status: AC
  Administered 2018-12-31: 13:00:00 via INTRAVENOUS

## 2018-12-31 MED ORDER — KETOROLAC TROMETHAMINE 30 MG/ML IJ SOLN: 30.0000 mg | Freq: Four times a day (QID) | INTRAMUSCULAR | Status: DC | PRN

## 2018-12-31 MED ORDER — VITAMIN B-1 100 MG PO TABS
100.0000 mg | ORAL_TABLET | Freq: Every day | ORAL | Status: DC
  Administered 2018-12-31 – 2019-01-02 (×3): 100 mg via ORAL
  Filled 2018-12-31 (×3): qty 1

## 2018-12-31 NOTE — ED Notes (Signed)
Pt is diaphoretic and reporting increased pain and nausea. MD made aware. Pt sitting in recliner and does not want to lay back.

## 2018-12-31 NOTE — ED Notes (Signed)
Pt called out, continues to complain of severe abdominal pain.  EDP, Jimmye Norman, consulted per this RN.  See new orders.

## 2018-12-31 NOTE — ED Triage Notes (Signed)
C/o abdominal pain, bilateral flank pain, epigastric pain. Nausea, emesis containing bile. Pt color WNL, pt appears uncomfortable.

## 2018-12-31 NOTE — ED Notes (Signed)
First RN Note; Pt in via EMS from home with complaints of abdominal pain, N/V.  Hx of Pancreatitis.  CBG 213.

## 2018-12-31 NOTE — H&P (Signed)
[facility] at [place] NAME: Shaun Odonnell    MR#:  [id number]  DATE OF BIRTH:  Apr 14, 1966  DATE OF ADMISSION:  12/31/2018  PRIMARY CARE PHYSICIAN: Kirk Ruths, MD   REQUESTING/REFERRING PHYSICIAN: Dr. Quentin Cornwall.  CHIEF COMPLAINT:   Chief Complaint  Patient presents with  . Abdominal Pain   Abdominal pain, nausea and vomiting since this morning. HISTORY OF PRESENT ILLNESS:  Shaun Odonnell  is a 53 y.o. male with a known history of recurrent pancreatitis, ARDS, COPD, pneumonia, hyperlipidemia, fatty liver, pulmonary embolism and schizoaffective disorder.  The patient presents the ED with above chief complaints.  He started to have a nausea, vomiting and severe abdominal pain across the abdomen, constant and 10 out of 10 since this morning.  He denies any daily alcohol drinking but drink twice a week.  His last drink was 2 days ago.  He denies any anxiety or tremor.  His lipase is elevated more than thousand, CT scan of the abdomen report acute pancreatitis with peripancreatic edema. PAST MEDICAL HISTORY:   Past Medical History:  Diagnosis Date  . ARDS (adult respiratory distress syndrome) ([facility])   . ARDS (adult respiratory distress syndrome) ([place])   . Bipolar disorder ([place])   . Borderline diabetes   . COPD (chronic obstructive pulmonary disease) ([facility])    chronic cough and wheezing  . Depression   . Dizziness    medication related  . Dyspnea    with exertion  . Elevated lipids   . Fatty liver   . Hypercholesterolemia   . Pancreatitis   . Pneumonia    in past  . Pneumonia    in past  . Pre-diabetes    diet controlled  . Pulmonary embolism ([place])   . Schizoaffective disorder ([place])     PAST SURGICAL HISTORY:   Past Surgical History:  Procedure Laterality Date  . CATARACT EXTRACTION W/PHACO Right 03/26/2018   Procedure: CATARACT EXTRACTION PHACO AND INTRAOCULAR LENS PLACEMENT ([place]) RIGHT BORDERLINE DIABETIC;  Surgeon:  Leandrew Koyanagi, MD;  Location: [facility];  Service: Ophthalmology;  Laterality: Right;  . CATARACT EXTRACTION W/PHACO Left 04/23/2018   Procedure: CATARACT EXTRACTION PHACO AND INTRAOCULAR LENS PLACEMENT ([place])  LEFT BORDERLINE DIABETIC;  Surgeon: Leandrew Koyanagi, MD;  Location: [facility];  Service: Ophthalmology;  Laterality: Left;  . COLONOSCOPY WITH PROPOFOL N/A 08/21/2017   Procedure: COLONOSCOPY WITH PROPOFOL;  Surgeon: Manya Silvas, MD;  Location: [facility] ENDOSCOPY;  Service: Endoscopy;  Laterality: N/A;  . HERNIA REPAIR      SOCIAL HISTORY:   Social History   Tobacco Use  . Smoking status: Current Every Day Smoker    Packs/day: 1.00    Years: 30.00    Pack years: 30.00    Types: Cigarettes  . Smokeless tobacco: Former Systems developer    Types: Snuff  Substance Use Topics  . Alcohol use: Yes    Alcohol/week: 4.0 standard drinks    Types: 4 Cans of beer per week    Comment: history of, none since February     FAMILY HISTORY:  No family history on file.  DRUG ALLERGIES:   Allergies  Allergen Reactions  . Klonopin [Clonazepam]   . Morphine And Related   . Pimozide   . Stelazine [Trifluoperazine]     REVIEW OF SYSTEMS:   Review of Systems  Constitutional: Negative for chills, fever and malaise/fatigue.  HENT: Negative for sore throat.   Eyes: Negative for blurred vision and  double vision.  Respiratory: Negative for cough, hemoptysis, shortness of breath, wheezing and stridor.   Cardiovascular: Negative for chest pain, palpitations, orthopnea and leg swelling.  Gastrointestinal: Positive for blood in stool, diarrhea, nausea and vomiting. Negative for abdominal pain, constipation and melena.  Genitourinary: Negative for dysuria, flank pain and hematuria.  Musculoskeletal: Negative for back pain and joint pain.  Skin: Negative for rash.  Neurological: Negative for dizziness, sensory change, focal weakness, seizures, loss of consciousness,  weakness and headaches.  Endo/Heme/Allergies: Negative for polydipsia.  Psychiatric/Behavioral: Negative for depression. The patient is not nervous/anxious.     MEDICATIONS AT HOME:   Prior to Admission medications   Medication Sig Start Date End Date Taking? Authorizing Provider  ALPRAZolam Duanne Moron) 0.5 MG tablet Take 0.5 mg by mouth 4 (four) times daily.  06/26/18  Yes [provider]  empagliflozin (JARDIANCE) 25 MG TABS tablet Take 25 mg by mouth daily.   Yes [provider]  OLANZapine (ZYPREXA) 15 MG tablet Take 15 mg by mouth at bedtime.   Yes [provider]  sertraline (ZOLOFT) 100 MG tablet Take 100 mg by mouth daily with breakfast.    Yes [provider]  simvastatin (ZOCOR) 40 MG tablet Take 40 mg by mouth daily.    Yes [provider]  albuterol (PROVENTIL HFA;VENTOLIN HFA) 108 (90 Base) MCG/ACT inhaler Inhale 2 puffs into the lungs every 6 (six) hours as needed for wheezing or shortness of breath. 09/25/16   Delman Kitten, MD  nicotine (NICODERM CQ - DOSED IN MG/24 HOURS) 14 mg/24hr patch Place 1 patch (14 mg total) onto the skin daily. 07/10/18   Bettey Costa, MD      VITAL SIGNS:  Blood pressure (!) 155/98, pulse 95, temperature 98 F (36.7 C), temperature source Oral, resp. rate 18, height 5\' 8"  (1.727 m), weight 88.5 kg, SpO2 97 %.  PHYSICAL EXAMINATION:  Physical Exam  GENERAL:  53 y.o.-year-old patient lying in the bed with no acute distress.  EYES: Pupils equal, round, reactive to light and accommodation. No scleral icterus. Extraocular muscles intact.  HEENT: Head atraumatic, normocephalic. Oropharynx and nasopharynx clear.  NECK:  Supple, no jugular venous distention. No thyroid enlargement, no tenderness.  LUNGS: Normal breath sounds bilaterally, no wheezing, rales,rhonchi or crepitation. No use of accessory muscles of respiration.  CARDIOVASCULAR: S1, S2 normal. No murmurs, rubs, or gallops.  ABDOMEN: Soft, tenderness,  nondistended. Bowel sounds present. No organomegaly or mass.  EXTREMITIES: No pedal edema, cyanosis, or clubbing.  NEUROLOGIC: Cranial nerves II through XII are intact. Muscle strength 5/5 in all extremities. Sensation intact. Gait not checked.  PSYCHIATRIC: The patient is alert and oriented x 3.  SKIN: No obvious rash, lesion, or ulcer.   LABORATORY PANEL:   CBC Recent Labs  Lab 12/31/18 1042  WBC 16.9*  HGB 20.0*  HCT 58.3*  PLT 197   ------------------------------------------------------------------------------------------------------------------  Chemistries  Recent Labs  Lab 12/31/18 1042  NA 135  K 5.4*  CL 98  CO2 20*  GLUCOSE 216*  BUN 13  CREATININE 0.74  CALCIUM 9.4  AST 51*  ALT 53*  ALKPHOS 108  BILITOT 1.3*   ------------------------------------------------------------------------------------------------------------------  Cardiac Enzymes No results for input(s): TROPONINI in the last 168 hours. ------------------------------------------------------------------------------------------------------------------  RADIOLOGY:  Ct Abdomen Pelvis W Contrast  Result Date: 12/31/2018 CLINICAL DATA:  Elevated lipase and white blood cell count. Abdominal pain and bilateral flank pain with epigastric pain, nausea, and emesis. History pancreatitis. EXAM: CT ABDOMEN AND PELVIS WITH CONTRAST TECHNIQUE:  Multidetector CT imaging of the abdomen and pelvis was performed using the standard protocol following bolus administration of intravenous contrast. CONTRAST:  129mL OMNIPAQUE IOHEXOL 300 MG/ML  SOLN COMPARISON:  07/06/2018 FINDINGS: Lower chest: Left lower lobe bandlike density on image 7/4 suggesting mild atelectasis or scarring. The basilar opacities have otherwise considerably improved compared to 07/06/2018. Hepatobiliary: Diffuse hepatic steatosis. Minimal spurring along the gallbladder fossa. Relatively high density in the gallbladder suggesting sludge or less likely  gallstones. Indistinctness of the porta hepatis due to the peripancreatic inflammation. I see definite biliary dilatation but much of the extrahepatic biliary tree is obscured. Pancreas: Diffuse peripancreatic inflammatory stranding and acute peripancreatic fluid collections. A 2.8 by 2.8 by 1.5 cm hypodense lesion in the pancreatic tail could represent mass, pseudocyst, or region of prior pancreatic necrosis. There is expansion of the pancreatic head uncinate process most likely inflammatory, less likely neoplastic. Spleen: Unremarkable.  Patent splenic vein. Adrenals/Urinary Tract: Unremarkable Stomach/Bowel: Secondary inflammation of the duodenal bulb, descending duodenum, transverse duodenum. No bowel or gastric dilatation. Filling defects in the left abdominal small bowel likely represent food material such as fat doubles. Vascular/Lymphatic: Aortoiliac atherosclerotic vascular disease. Patent portal vein and splenic vein. Small celiac chain lymph nodes are present. These are likely reactive. No overtly pathologic adenopathy. Reproductive: Borderline prostatomegaly. Other: No supplemental non-categorized findings. Musculoskeletal: Small umbilical hernia contains adipose tissues. IMPRESSION: 1. Acute pancreatitis with pancreatic and peripancreatic edema as well as acute peripancreatic fluid collections. 2. A hypodense lesion within the tip of the tail the pancreas measuring 2.8 by 2.8 by 1.5 cm is most likely a pseudocyst, or focus of prior pancreatic necrosis. Pancreatic malignancy is considered a less likely differential diagnostic consideration in this setting. 3. Diffuse hepatic steatosis. 4. Sludge versus small gallstones in the gallbladder. Indistinct extrahepatic biliary tree due to the inflammation. 5. Mild secondary inflammation of the duodenum. 6.  Aortic Atherosclerosis (ICD10-I70.0). 7. Borderline prostatomegaly. 8. Small umbilical hernia contains adipose tissues. Electronically Signed   By: Van Clines M.D.   On: 12/31/2018 15:06      IMPRESSION AND PLAN:   Acute pancreatitis.  History of recurrent pancreatitis. The patient will be admitted to medical floor. N.p.o. except ice and math, IV fluid support and pain control.  Follow-up lipase.  Hyperkalemia.  IV fluid support and follow-up potassium. Leukocytosis, possible due to reaction secondary to acute pancreatitis.  Follow-up CBC. Alcohol abuse.  CIWA protocol. Diffuse hepatic steatosis.  Due to alcohol hyperlipidemia.  Follow-up PCP. Sludge versus small gallstones in the gallbladder.  May need surgery in the future. Hyperlipidemia.  Continue Zocor. Tobacco abuse.  Smoking cessation was counseled for 3 to 4 minutes, nicotine patch.  All the records are reviewed and case discussed with ED provider. Management plans discussed with the patient, family and they are in agreement.  CODE STATUS: Full code.  TOTAL TIME TAKING CARE OF THIS PATIENT: 37 minutes.    Demetrios Loll M.D on 12/31/2018 at 3:34 PM  Between 7am to 6pm - Pager - [phone]  After 6pm go to [email or web address] - [employer] brewer [facility]  Office  [phone]  CC: Primary care physician; Kirk Ruths, MD   Note: This dictation was prepared with Dragon dictation along with smaller phrase technology. Any transcriptional errors that result from this process are unin

## 2018-12-31 NOTE — ED Notes (Signed)
Pt states medication did not help him at all.  This RN consulted with EDP, Mal[place]; see new orders.

## 2018-12-31 NOTE — Progress Notes (Signed)
Advanced Care Plan.  Purpose of Encounter: CODE STATUS. Parties in Attendance: The patient and me. Patient's Decisional Capacity: Yes. Medical Story: Shaun Odonnell  is a 53 y.o. male with a known history of recurrent pancreatitis, ARDS, COPD, pneumonia, hyperlipidemia, fatty liver, pulmonary embolism and schizoaffective disorder.  The patient also has a history of intubation due to ARDS secondary to pancreatitis.  I discussed with patient about his current condition, prognosis and CODE STATUS.  He does want to be resuscitated and intubated if he has a cardiopulmonary arrest. Plan:  Code Status: Full code. Time spent discussing advance care planning: 17 minutes.

## 2018-12-31 NOTE — ED Provider Notes (Signed)
[facility] Emergency Department Provider Note    First MD Initiated Contact with Patient 12/31/18 1416     (approximate)  I have reviewed the triage vital signs and the nursing notes.   HISTORY  Chief Complaint Abdominal Pain    HPI Shaun Odonnell is a 53 y.o. male the below listed past medical history presents the ER for less than 24 hours of nausea vomiting diffuse epigastric pain.  Has extensive history of alcohol abuse as well as recurrent pancreatitis.  Denies any medication changes.  States he did drink alcohol 2 days ago to "settle his stomach ".  Describes the pain currently is moderate to severe.  Denies any diarrhea.  Is been able to keep any food down for the past 24 hours.    Past Medical History:  Diagnosis Date  . ARDS (adult respiratory distress syndrome) ([place])   . ARDS (adult respiratory distress syndrome) ([place])   . Bipolar disorder ([place])   . Borderline diabetes   . COPD (chronic obstructive pulmonary disease) ([facility])    chronic cough and wheezing  . Depression   . Dizziness    medication related  . Dyspnea    with exertion  . Elevated lipids   . Fatty liver   . Hypercholesterolemia   . Pancreatitis   . Pneumonia    in past  . Pneumonia    in past  . Pre-diabetes    diet controlled  . Pulmonary embolism ([place])   . Schizoaffective disorder ([place])    No family history on file. Past Surgical History:  Procedure Laterality Date  . CATARACT EXTRACTION W/PHACO Right 03/26/2018   Procedure: CATARACT EXTRACTION PHACO AND INTRAOCULAR LENS PLACEMENT ([place]) RIGHT BORDERLINE DIABETIC;  Surgeon: Leandrew Koyanagi, MD;  Location: [facility];  Service: Ophthalmology;  Laterality: Right;  . CATARACT EXTRACTION W/PHACO Left 04/23/2018   Procedure: CATARACT EXTRACTION PHACO AND INTRAOCULAR LENS PLACEMENT ([place])  LEFT BORDERLINE DIABETIC;  Surgeon: Leandrew Koyanagi, MD;  Location: [facility];  Service: Ophthalmology;   Laterality: Left;  . COLONOSCOPY WITH PROPOFOL N/A 08/21/2017   Procedure: COLONOSCOPY WITH PROPOFOL;  Surgeon: Manya Silvas, MD;  Location: [facility] ENDOSCOPY;  Service: Endoscopy;  Laterality: N/A;  . HERNIA REPAIR     Patient Active Problem List   Diagnosis Date Noted  . Pancreatitis 07/02/2018      Prior to Admission medications   Medication Sig Start Date End Date Taking? Authorizing Provider  albuterol (PROVENTIL HFA;VENTOLIN HFA) 108 (90 Base) MCG/ACT inhaler Inhale 2 puffs into the lungs every 6 (six) hours as needed for wheezing or shortness of breath. 09/25/16   Delman Kitten, MD  ALPRAZolam Duanne Moron) 0.5 MG tablet Take 0.5 mg by mouth 3 (three) times daily. 06/26/18   [provider]  nicotine (NICODERM CQ - DOSED IN MG/24 HOURS) 14 mg/24hr patch Place 1 patch (14 mg total) onto the skin daily. 07/10/18   Bettey Costa, MD  OLANZapine (ZYPREXA) 15 MG tablet Take 15 mg by mouth at bedtime.    [provider]  sertraline (ZOLOFT) 100 MG tablet Take 100 mg by mouth daily. am    [provider]  simvastatin (ZOCOR) 40 MG tablet Take 40 mg by mouth at bedtime.     [provider]    Allergies Klonopin [place]; Morphine and related; Pimozide; and Stelazine [trifluoperazine]    Social History Social History   Tobacco Use  . Smoking status: Current Every Day Smoker    Packs/day: 1.00  Years: 30.00    Pack years: 30.00    Types: Cigarettes  . Smokeless tobacco: Former Systems developer    Types: Snuff  Substance Use Topics  . Alcohol use: Yes    Alcohol/week: 4.0 standard drinks    Types: 4 Cans of beer per week    Comment: history of, none since February   . Drug use: Not Currently    Review of Systems Patient denies headaches, rhinorrhea, blurry vision, numbness, shortness of breath, chest pain, edema, cough, abdominal pain, nausea, vomiting, diarrhea, dysuria, fevers, rashes or hallucinations unless otherwise stated above in  HPI. ____________________________________________   PHYSICAL EXAM:  VITAL SIGNS: Vitals:   12/31/18 1040  BP: (!) 155/98  Pulse: 95  Resp: 18  Temp: 98 F (36.7 C)  SpO2: 97%    Constitutional: Alert and oriented. Ill appearing Eyes: Conjunctivae are normal.  Head: Atraumatic. Nose: No congestion/rhinnorhea. Mouth/Throat: Mucous membranes are moist.   Neck: No stridor. Painless ROM.  Cardiovascular: Normal rate, regular rhythm. Grossly normal heart sounds.  Good peripheral circulation. Respiratory: Normal respiratory effort.  No retractions. Lungs CTAB. Gastrointestinal: Soft and diffusely tender. No distention. No abdominal bruits. No CVA tenderness. Genitourinary: deferred Musculoskeletal: No lower extremity tenderness nor edema.  No joint effusions. Neurologic:  Normal speech and language. No gross focal neurologic deficits are appreciated. No facial droop Skin:  Skin is warm, dry and intact. No rash noted. Psychiatric: Mood and affect are normal. Speech and behavior are normal.  ____________________________________________   LABS (all labs ordered are listed, but only abnormal results are displayed)  Results for orders placed or performed during the hospital encounter of 12/31/18 (from the past 24 hour(s))  Lipase, blood     Status: Abnormal   Collection Time: 12/31/18 10:42 AM  Result Value Ref Range   Lipase 1,059 (H) 11 - 51 U/L  Comprehensive metabolic panel     Status: Abnormal   Collection Time: 12/31/18 10:42 AM  Result Value Ref Range   Sodium 135 135 - 145 mmol/L   Potassium 5.4 (H) 3.5 - 5.1 mmol/L   Chloride 98 98 - 111 mmol/L   CO2 20 (L) 22 - 32 mmol/L   Glucose, Bld 216 (H) 70 - 99 mg/dL   BUN 13 6 - 20 mg/dL   Creatinine, Ser 0.74 0.61 - 1.24 mg/dL   Calcium 9.4 8.9 - 10.3 mg/dL   Total Protein 8.0 6.5 - 8.1 g/dL   Albumin 4.9 3.5 - 5.0 g/dL   AST 51 (H) 15 - 41 U/L   ALT 53 (H) 0 - 44 U/L   Alkaline Phosphatase 108 38 - 126 U/L   Total  Bilirubin 1.3 (H) 0.3 - 1.2 mg/dL   GFR calc non Af Amer >60 >60 mL/min   GFR calc Af Amer >60 >60 mL/min   Anion gap 17 (H) 5 - 15  CBC     Status: Abnormal   Collection Time: 12/31/18 10:42 AM  Result Value Ref Range   WBC 16.9 (H) 4.0 - 10.5 K/uL   RBC 6.17 (H) 4.22 - 5.81 MIL/uL   Hemoglobin 20.0 (H) 13.0 - 17.0 g/dL   HCT 58.3 (H) 39.0 - 52.0 %   MCV 94.5 80.0 - 100.0 fL   MCH 32.4 26.0 - 34.0 pg   MCHC 34.3 30.0 - 36.0 g/dL   RDW 14.6 11.5 - 15.5 %   Platelets 197 150 - 400 K/uL   nRBC 0.0 0.0 - 0.2 %   ____________________________________________  EKG  My review and personal interpretation at Time: 10:45   Indication: abd pain  Rate: 100  Rhythm: sinus Axis: normal Other: normal intervals, no stemi ____________________________________________  RADIOLOGY  I personally reviewed all radiographic images ordered to evaluate for the above acute complaints and reviewed radiology reports and findings.  These findings were personally discussed with the patient.  Please see medical record for radiology report.  ____________________________________________   PROCEDURES  Procedure(s) performed:  .Critical Care Performed by: Merlyn Lot, MD Authorized by: Merlyn Lot, MD   Critical care provider statement:    Critical care time (minutes):  30   Critical care time was exclusive of:  Separately billable procedures and treating other patients   Critical care was necessary to treat or prevent imminent or life-threatening deterioration of the following conditions:  Dehydration   Critical care was time spent personally by me on the following activities:  Development of treatment plan with patient or surrogate, discussions with consultants, evaluation of patient's response to treatment, examination of patient, obtaining history from patient or surrogate, ordering and performing treatments and interventions, ordering and review of laboratory studies, ordering and review of  radiographic studies, pulse oximetry, re-evaluation of patient's condition and review of old charts      Critical Care performed: yes ____________________________________________   INITIAL IMPRESSION / [place] / ED COURSE  Pertinent labs & imaging results that were available during my care of the patient were reviewed by me and considered in my medical decision making (see chart for details).   DDX: pancreatitis, dehydration, cholelithiasis, cholecystitis, enteritis, gastritis  Shaun Odonnell is a 53 y.o. who presents to the ED with symptoms as described above.  Patient with longstanding history of pancreatitis presenting with worsening symptoms today.  Does appear very uncomfortable requiring several doses of IV pain medication and IV antiemetics.  Patient with blood work showing evidence of significantly elevated lipase and significant hemoconcentration with hemoglobin of 20.  Patient significantly dehydrated.  We will give IV fluids.  CT imaging will be ordered as he does have a history of necrotic pancreatitis to ensure that there is no evidence of pseudo-cyst.  He is not febrile so infected pancreatitis seems less likely at this time but will require hospitalization for further medical management.      As part of my medical decision making, I reviewed the following data within the [place] notes reviewed and incorporated, Labs reviewed, notes from prior ED visits and  Controlled Substance Database   ____________________________________________   FINAL CLINICAL IMPRESSION(S) / ED DIAGNOSES  Final diagnoses:  Acute pancreatitis, unspecified complication status, unspecified pancreatitis type  Dehydration      NEW MEDICATIONS STARTED DURING THIS VISIT:  New Prescriptions   No medications on file     Note:  This document was prepared using Dragon voice recognition software and may include unintentional dictation errors.     Merlyn Lot, MD 12/31/18 1455

## 2018-12-31 NOTE — ED Notes (Signed)
ED TO INPATIENT HANDOFF REPORT  Name/Age/Gender Shaun Odonnell 53 y.o. male  Code Status    Code Status Orders  (From admission, onward)         Start     Ordered   12/31/18 1643  Full code  Continuous     12/31/18 1642        Code Status History    Date Active Date Inactive Code Status Order ID Comments User Context   07/02/2018 1105 07/09/2018 1910 Full Code [id number]  Bettey Costa, MD Inpatient      Home/SNF/Other Home  Chief Complaint abd  pain  Level of Care/Admitting Diagnosis ED Disposition    ED Disposition Condition [place]: [facility] [[id number]]  Level of Care: Med-Surg [16]  Diagnosis: Acute pancreatitis [577.0.ICD-9-CM]  Admitting Physician: Demetrios Loll [[id number]]  Attending Physician: Demetrios Loll [[id number]]  Estimated length of stay: past midnight tomorrow  Certification:: I certify this patient will need inpatient services for at least 2 midnights  PT Class (Do Not Modify): Inpatient [101]  PT Acc Code (Do Not Modify): Private [1]       Medical History Past Medical History:  Diagnosis Date  . ARDS (adult respiratory distress syndrome) ([place])   . ARDS (adult respiratory distress syndrome) ([place])   . Bipolar disorder ([place])   . Borderline diabetes   . COPD (chronic obstructive pulmonary disease) ([facility])    chronic cough and wheezing  . Depression   . Dizziness    medication related  . Dyspnea    with exertion  . Elevated lipids   . Fatty liver   . Hypercholesterolemia   . Pancreatitis   . Pneumonia    in past  . Pneumonia    in past  . Pre-diabetes    diet controlled  . Pulmonary embolism ([place])   . Schizoaffective disorder ([facility])     Allergies Allergies  Allergen Reactions  . Klonopin [Clonazepam]   . Morphine And Related   . Pimozide   . Stelazine [Trifluoperazine]     IV Location/Drains/Wounds Patient Lines/Drains/Airways Status   Active Line/Drains/Airways    Name:   Placement date:    Placement time:   Site:   Days:   Peripheral IV 12/31/18 Left Antecubital   12/31/18    1051    Antecubital   less than 1   Incision (Closed) 03/26/18 Eye Right   03/26/18    0800     280   Incision (Closed) 04/23/18 Eye Left   04/23/18    0824     252          Labs/Imaging Results for orders placed or performed during the hospital encounter of 12/31/18 (from the past 48 hour(s))  Lipase, blood     Status: Abnormal   Collection Time: 12/31/18 10:42 AM  Result Value Ref Range   Lipase 1,059 (H) 11 - 51 U/L    Comment: RESULT CONFIRMED BY MANUAL DILUTION KLW Performed at [facility], [address] [place], [place] [address]   Comprehensive metabolic panel     Status: Abnormal   Collection Time: 12/31/18 10:42 AM  Result Value Ref Range   Sodium 135 135 - 145 mmol/L   Potassium 5.4 (H) 3.5 - 5.1 mmol/L   Chloride 98 98 - 111 mmol/L   CO2 20 (L) 22 - 32 mmol/L   Glucose, Bld 216 (H) 70 - 99 mg/dL   BUN 13 6 - 20 mg/dL  Creatinine, Ser 0.74 0.61 - 1.24 mg/dL   Calcium 9.4 8.9 - 10.3 mg/dL   Total Protein 8.0 6.5 - 8.1 g/dL   Albumin 4.9 3.5 - 5.0 g/dL   AST 51 (H) 15 - 41 U/L   ALT 53 (H) 0 - 44 U/L   Alkaline Phosphatase 108 38 - 126 U/L   Total Bilirubin 1.3 (H) 0.3 - 1.2 mg/dL   GFR calc non Af Amer >60 >60 mL/min   GFR calc Af Amer >60 >60 mL/min   Anion gap 17 (H) 5 - 15    Comment: Performed at [facility], [address] [place], [place] [address]  CBC     Status: Abnormal   Collection Time: 12/31/18 10:42 AM  Result Value Ref Range   WBC 16.9 (H) 4.0 - 10.5 K/uL   RBC 6.17 (H) 4.22 - 5.81 MIL/uL   Hemoglobin 20.0 (H) 13.0 - 17.0 g/dL    Comment: REPEATED TO VERIFY   HCT 58.3 (H) 39.0 - 52.0 %   MCV 94.5 80.0 - 100.0 fL   MCH 32.4 26.0 - 34.0 pg   MCHC 34.3 30.0 - 36.0 g/dL   RDW 14.6 11.5 - 15.5 %   Platelets 197 150 - 400 K/uL   nRBC 0.0 0.0 - 0.2 %    Comment: Performed at [facility], [address], [place], [place]  [address]  Magnesium     Status: None   Collection Time: 12/31/18 10:42 AM  Result Value Ref Range   Magnesium 2.0 1.7 - 2.4 mg/dL    Comment: Performed at [facility], [address] [place], [place] [address]  Hemoglobin A1c     Status: Abnormal   Collection Time: 12/31/18 10:42 AM  Result Value Ref Range   Hgb A1c MFr Bld 7.5 (H) 4.8 - 5.6 %    Comment: (NOTE) Pre diabetes:          5.7%-6.4% Diabetes:              >6.4% Glycemic control for   <7.0% adults with diabetes    Mean Plasma Glucose 168.55 mg/dL    Comment: Performed at [facility] [address] [place], [place] [address]  Urinalysis, Complete w Microscopic     Status: Abnormal   Collection Time: 12/31/18  5:43 PM  Result Value Ref Range   Color, Urine YELLOW (A) YELLOW   APPearance CLEAR (A) CLEAR   Specific Gravity, Urine >1.046 (H) 1.005 - 1.030   pH 5.0 5.0 - 8.0   Glucose, UA >=500 (A) NEGATIVE mg/dL   Hgb urine dipstick SMALL (A) NEGATIVE   Bilirubin Urine NEGATIVE NEGATIVE   Ketones, ur 80 (A) NEGATIVE mg/dL   Protein, ur NEGATIVE NEGATIVE mg/dL   Nitrite NEGATIVE NEGATIVE   Leukocytes,Ua NEGATIVE NEGATIVE   RBC / HPF 0-5 0 - 5 RBC/hpf   WBC, UA 0-5 0 - 5 WBC/hpf   Bacteria, UA NONE SEEN NONE SEEN   Squamous Epithelial / LPF NONE SEEN 0 - 5    Comment: Performed at [facility], [address] [place], [place] [phone]   Ct Abdomen Pelvis W Contrast  Result Date: 12/31/2018 CLINICAL DATA:  Elevated lipase and white blood cell count. Abdominal pain and bilateral flank pain with epigastric pain, nausea, and emesis. History pancreatitis. EXAM: CT ABDOMEN AND PELVIS WITH CONTRAST TECHNIQUE: Multidetector CT imaging of the abdomen and pelvis was performed using the standard protocol following bolus administration of  intravenous contrast. CONTRAST:  148mL OMNIPAQUE IOHEXOL 300 MG/ML  SOLN COMPARISON:  07/06/2018 FINDINGS: Lower chest: Left lower lobe bandlike density on image 7/4  suggesting mild atelectasis or scarring. The basilar opacities have otherwise considerably improved compared to 07/06/2018. Hepatobiliary: Diffuse hepatic steatosis. Minimal spurring along the gallbladder fossa. Relatively high density in the gallbladder suggesting sludge or less likely gallstones. Indistinctness of the porta hepatis due to the peripancreatic inflammation. I see definite biliary dilatation but much of the extrahepatic biliary tree is obscured. Pancreas: Diffuse peripancreatic inflammatory stranding and acute peripancreatic fluid collections. A 2.8 by 2.8 by 1.5 cm hypodense lesion in the pancreatic tail could represent mass, pseudocyst, or region of prior pancreatic necrosis. There is expansion of the pancreatic head uncinate process most likely inflammatory, less likely neoplastic. Spleen: Unremarkable.  Patent splenic vein. Adrenals/Urinary Tract: Unremarkable Stomach/Bowel: Secondary inflammation of the duodenal bulb, descending duodenum, transverse duodenum. No bowel or gastric dilatation. Filling defects in the left abdominal small bowel likely represent food material such as fat doubles. Vascular/Lymphatic: Aortoiliac atherosclerotic vascular disease. Patent portal vein and splenic vein. Small celiac chain lymph nodes are present. These are likely reactive. No overtly pathologic adenopathy. Reproductive: Borderline prostatomegaly. Other: No supplemental non-categorized findings. Musculoskeletal: Small umbilical hernia contains adipose tissues. IMPRESSION: 1. Acute pancreatitis with pancreatic and peripancreatic edema as well as acute peripancreatic fluid collections. 2. A hypodense lesion within the tip of the tail the pancreas measuring 2.8 by 2.8 by 1.5 cm is most likely a pseudocyst, or focus of prior pancreatic necrosis. Pancreatic malignancy is considered a less likely differential diagnostic consideration in this setting. 3. Diffuse hepatic steatosis. 4. Sludge versus small gallstones in  the gallbladder. Indistinct extrahepatic biliary tree due to the inflammation. 5. Mild secondary inflammation of the duodenum. 6.  Aortic Atherosclerosis (ICD10-I70.0). 7. Borderline prostatomegaly. 8. Small umbilical hernia contains adipose tissues. Electronically Signed   By: Van Clines M.D.   On: 12/31/2018 15:06    Pending Labs Unresulted Labs (From admission, onward)    Start     Ordered   01/07/19 0500  Creatinine, serum  (enoxaparin (LOVENOX)    CrCl >/= 30 ml/min)  Weekly,   STAT    Comments:  while on enoxaparin therapy    12/31/18 1642   01/01/19 5397  Basic metabolic panel  Tomorrow morning,   STAT     12/31/18 1642   01/01/19 0500  CBC  Tomorrow morning,   STAT     12/31/18 1642          Vitals/Pain Today's Vitals   12/31/18 1554 12/31/18 1559 12/31/18 1812 12/31/18 2207  BP:      Pulse:      Resp:      Temp:      TempSrc:      SpO2:      Weight:      Height:      PainSc: 10-Worst pain ever 9  9  9      Isolation Precautions No active isolations  Medications Medications  acetaminophen (TYLENOL) tablet 650 mg (has no administration in time range)    Or  acetaminophen (TYLENOL) suppository 650 mg (has no administration in time range)  ondansetron (ZOFRAN) tablet 4 mg ( Oral See Alternative 12/31/18 2210)    Or  ondansetron (ZOFRAN) injection 4 mg (4 mg Intravenous Given 12/31/18 2210)  albuterol (PROVENTIL) (2.5 MG/3ML) 0.083% nebulizer solution 2.5 mg (has no administration in time range)  enoxaparin (LOVENOX) injection 40 mg (40 mg Subcutaneous Given  12/31/18 1705)  0.9 %  sodium chloride infusion ( Intravenous Bolus 12/31/18 1812)  ketorolac (TORADOL) 30 MG/ML injection 30 mg (has no administration in time range)  HYDROmorphone (DILAUDID) injection 0.5 mg (0.5 mg Intravenous Given 12/31/18 1953)  nicotine (NICODERM CQ - dosed in mg/24 hours) patch 14 mg (14 mg Transdermal Patch Applied 12/31/18 1705)  simvastatin (ZOCOR) tablet 40 mg (40 mg Oral Given  12/31/18 1702)  ALPRAZolam (XANAX) tablet 0.5 mg (0.5 mg Oral Given 12/31/18 2211)  OLANZapine (ZYPREXA) tablet 15 mg (15 mg Oral Given 12/31/18 1951)  sertraline (ZOLOFT) tablet 100 mg (has no administration in time range)  thiamine (VITAMIN B-1) tablet 100 mg (100 mg Oral Given 12/31/18 1703)    Or  thiamine (B-1) injection 100 mg ( Intravenous See Alternative 01/14/20 6244)  folic acid (FOLVITE) tablet 1 mg (1 mg Oral Given 12/31/18 1703)  multivitamin with minerals tablet 1 tablet (1 tablet Oral Given 12/31/18 1703)  ondansetron (ZOFRAN) injection 4 mg (4 mg Intravenous Given 12/31/18 1052)  fentaNYL (SUBLIMAZE) injection 50 mcg (50 mcg Intravenous Given 12/31/18 1052)  fentaNYL (SUBLIMAZE) injection 50 mcg (50 mcg Intravenous Given 12/31/18 1135)  HYDROmorphone (DILAUDID) injection 1 mg (1 mg Intravenous Given 12/31/18 1231)  ondansetron (ZOFRAN) injection 4 mg (4 mg Intravenous Given 12/31/18 1231)  0.9 %  sodium chloride infusion ( Intravenous Stopped 12/31/18 1812)  HYDROmorphone (DILAUDID) injection 1 mg (1 mg Intravenous Given 12/31/18 1402)  sodium chloride 0.9 % bolus 1,000 mL (0 mLs Intravenous Stopped 12/31/18 1812)  iohexol (OMNIPAQUE) 300 MG/ML solution 100 mL (100 mLs Intravenous Contrast Given 12/31/18 1430)  ketorolac (TORADOL) 30 MG/ML injection 15 mg (15 mg Intravenous Given 12/31/18 1520)    Mobility walks

## 2019-01-01 LAB — BASIC METABOLIC PANEL
Anion gap: 16 — ABNORMAL HIGH (ref 5–15)
BUN: 13 mg/dL (ref 6–20)
CO2: 17 mmol/L — ABNORMAL LOW (ref 22–32)
Calcium: 8.6 mg/dL — ABNORMAL LOW (ref 8.9–10.3)
Chloride: 100 mmol/L (ref 98–111)
Creatinine, Ser: 0.89 mg/dL (ref 0.61–1.24)
GFR calc Af Amer: >60 mL/min (ref >60)
GFR calc non Af Amer: >60 mL/min (ref >60)
Glucose, Bld: 190 mg/dL — ABNORMAL HIGH (ref 70–99)
Potassium: 4.8 mmol/L (ref 3.5–5.1)
Sodium: 133 mmol/L — ABNORMAL LOW (ref 135–145)

## 2019-01-01 LAB — CBC
HEMATOCRIT: 54.7 % — AB (ref 39.0–52.0)
Hemoglobin: 18 g/dL — ABNORMAL HIGH (ref 13.0–17.0)
MCH: 32.7 pg (ref 26.0–34.0)
MCHC: 32.9 g/dL (ref 30.0–36.0)
MCV: 99.5 fL (ref 80.0–100.0)
Platelets: 124 10*3/uL — ABNORMAL LOW (ref 150–400)
RBC: 5.5 MIL/uL (ref 4.22–5.81)
RDW: 15.2 % (ref 11.5–15.5)
WBC: 17.6 10*3/uL — ABNORMAL HIGH (ref 4.0–10.5)
nRBC: 0 % (ref 0.0–0.2)

## 2019-01-01 LAB — GLUCOSE, CAPILLARY: Glucose-Capillary: 164 mg/dL — ABNORMAL HIGH (ref 70–99)

## 2019-01-01 MED ORDER — KETOROLAC TROMETHAMINE 30 MG/ML IJ SOLN
15.0000 mg | Freq: Four times a day (QID) | INTRAMUSCULAR | Status: DC | PRN
  Administered 2019-01-01 – 2019-01-02 (×3): 15 mg via INTRAVENOUS
  Filled 2019-01-01 (×3): qty 1

## 2019-01-01 MED ORDER — HYDROMORPHONE HCL 1 MG/ML IJ SOLN
0.5000 mg | INTRAMUSCULAR | Status: DC | PRN
  Administered 2019-01-01 – 2019-01-02 (×4): 0.5 mg via INTRAVENOUS
  Filled 2019-01-01 (×4): qty 1

## 2019-01-01 MED ORDER — MORPHINE SULFATE (PF) 2 MG/ML IV SOLN: 1.0000 mg | INTRAVENOUS | Status: DC | PRN

## 2019-01-01 MED ORDER — KETOROLAC TROMETHAMINE 30 MG/ML IJ SOLN: 30.0000 mg | Freq: Four times a day (QID) | INTRAMUSCULAR | Status: DC | PRN

## 2019-01-01 MED ORDER — ALPRAZOLAM 0.5 MG PO TABS
0.5000 mg | ORAL_TABLET | Freq: Four times a day (QID) | ORAL | Status: DC
  Administered 2019-01-01: 0.5 mg via ORAL
  Filled 2019-01-01: qty 1

## 2019-01-01 NOTE — Progress Notes (Signed)
Pt refusing Morphine, stating he "almost died", parents at the bedside stating that he will not take it, because he stopped breathing. MD Gouru notified, order received for 0.5 ml Dialudid. Q4 PRN, and to Morphine d/c Order placed.

## 2019-01-01 NOTE — Progress Notes (Signed)
Initial Nutrition Assessment  DOCUMENTATION CODES:   Not applicable  INTERVENTION:  Will monitor for diet advancement.  Once diet advanced to at least full liquids, recommend Ensure Max Protein po once daily, each supplement provides 150 kcal and 30 grams of protein.  NUTRITION DIAGNOSIS:   Increased nutrient needs related to catabolic illness(acute pancreatitis, COPD) as evidenced by estimated needs.  GOAL:   Patient will meet greater than or equal to 90% of their needs  MONITOR:   Diet advancement, PO intake, Supplement acceptance, Labs, Weight trends, I & O's  REASON FOR ASSESSMENT:   Malnutrition Screening Tool    ASSESSMENT:   53 year old male with PMHx of schizoaffective disorder, fatty liver, DM, recurrent pancreatitis, hx ARDS, pulmonary embolism, bipolar disorder, depression, COPD, EtOH abuse admitted with acute pancreatitis, hyperkalemia.   Met with patient at bedside. He reports that approximately 4 months ago he was started on Jardiance because his A1c was high. Since then he has had a decreased appetite and intake. He is still able to eat two meals per day and finishes both of those meals. For breakfast he may have eggs/sausage biscuit, cereal, English muffin with peanut butter, or a bagel with cream cheese. For dinner he has a meat with vegetables. He also eats two pieces of fruit per day. He reports he used to eat much more than this before starting Jardiance. Patient reports he has had pancreatitis before and is familiar with slow diet advancement. He is amenable to drinking EMP with diet advancement to help meet protein needs.  Patient reports his UBW was 235 lbs. He reports weight loss since starting Jardiance. Per chart he was 101.6 kg on 04/23/2018. RD obtained bed scale weight of 88.9 kg today (195.99 lbs). He has lost 12.7 kg (12.5% body weight) over 8 months, which is significant for time frame.  Medications reviewed and include: Xanax 0.5 mg QID, folic acid  1 mg daily, MVI daily, nicotine patch, Zyprexa, sertraline, thiamine 100 mg daily, NS @ 100 mL/hr.  Labs reviewed: CBG 164, Sodium 133, CO2 17. Lipase 1059 on 2/26. Last HgbA1c 7.5 on 12/31/2018.  Patient does not meet criteria for malnutrition at this time.  NUTRITION - FOCUSED PHYSICAL EXAM:    Most Recent Value  Orbital Region  No depletion  Upper Arm Region  No depletion  Thoracic and Lumbar Region  No depletion  Buccal Region  No depletion  Temple Region  No depletion  Clavicle Bone Region  No depletion  Clavicle and Acromion Bone Region  No depletion  Scapular Bone Region  No depletion  Dorsal Hand  No depletion  Patellar Region  No depletion  Anterior Thigh Region  No depletion  Posterior Calf Region  No depletion  Edema (RD Assessment)  None  Hair  Reviewed  Eyes  Unable to assess  Mouth  Unable to assess  Skin  Reviewed  Nails  Reviewed     Diet Order:   Diet Order            Diet NPO time specified Except for: Sips with Meds  Diet effective now             EDUCATION NEEDS:   No education needs have been identified at this time  Skin:  Skin Assessment: Reviewed RN Assessment  Last BM:  12/31/2018 per chart  Height:   Ht Readings from Last 1 Encounters:  12/31/18 5' 8" (1.727 m)   Weight:   Wt Readings from Last 1 Encounters:  01/01/19 88.9 kg   Ideal Body Weight:  70 kg  BMI:  Body mass index is 29.8 kg/m.  Estimated Nutritional Needs:   Kcal:  7564-3329 (MSJ x 1.2-1.3)  Protein:  100-115 grams (1.1-1.3 grams/kg)  Fluid:  2-2.2 L/day (1 mL/kcal)  Willey Blade, MS, RD, LDN Office: [phone] Pager: [phone] After Hours/Weekend Pager: [phone]

## 2019-01-01 NOTE — Progress Notes (Signed)
Inpatient Diabetes Program Recommendations  AACE/ADA: New Consensus Statement on Inpatient Glycemic Control (2015)  Target Ranges:  Prepandial:   less than 140 mg/dL      Peak postprandial:   less than 180 mg/dL (1-2 hours)      Critically ill patients:  140 - 180 mg/dL   Results for Shaun Odonnell, Shaun Odonnell (MRN [id number]) as of 01/01/2019 09:43  Ref. Range 01/01/2019 08:04  Glucose-Capillary Latest Ref Range: 70 - 99 mg/dL 164 (H)   Results for Shaun Odonnell, Shaun Odonnell (MRN [id number]) as of 01/01/2019 09:43  Ref. Range 12/31/2018 10:42  Hemoglobin A1C Latest Ref Range: 4.8 - 5.6 % 7.5 (H)    Admit with: Acute Pancreatitis  History: DM, COPD, ETOH  Home DM Meds: Jardiance 25 mg Daily  Current Orders: NONE   PCP: Dr. Ouida Sills with [facility]--Last seen 07/24/2018  Was prescribed Jardiance for glucose control     MD- Please consider adding Novolog Sensitive Correction Scale/ SSI (0-9 units) Q4 hours     --Will follow patient during hospitalization--  Wyn Quaker RN, MSN, CDE Diabetes Coordinator Inpatient Glycemic Control Team Team Pager: [phone] (8a-5p)

## 2019-01-01 NOTE — Progress Notes (Signed)
[facility] NAME: Shaun Odonnell    MR#:  [id number]  DATE OF BIRTH:  Apr 06, 1966  SUBJECTIVE:  CHIEF COMPLAINT: Reporting epigastric abdominal pain with nausea.  Pain is radiating to the back.  Still drinks alcohol  REVIEW OF SYSTEMS:  CONSTITUTIONAL: No fever, fatigue or weakness.  EYES: No blurred or double vision.  EARS, NOSE, AND THROAT: No tinnitus or ear pain.  RESPIRATORY: No cough, shortness of breath, wheezing or hemoptysis.  CARDIOVASCULAR: No chest pain, orthopnea, edema.  GASTROINTESTINAL: Epigastric abdominal pain radiating to the back associated with nausea denies any vomiting or diarrhea GENITOURINARY: No dysuria, hematuria.  ENDOCRINE: No polyuria, nocturia,  HEMATOLOGY: No anemia, easy bruising or bleeding SKIN: No rash or lesion. MUSCULOSKELETAL: No joint pain or arthritis.   NEUROLOGIC: No tingling, numbness, weakness.  PSYCHIATRY: No anxiety or depression.   DRUG ALLERGIES:   Allergies  Allergen Reactions  . Klonopin [Clonazepam]   . Morphine And Related   . Pimozide   . Stelazine [Trifluoperazine]     VITALS:  Blood pressure (!) 155/84, pulse 92, temperature 98.5 F (36.9 C), temperature source Oral, resp. rate 18, height 5\' 8"  (1.727 m), weight 88.9 kg, SpO2 97 %.  PHYSICAL EXAMINATION:  GENERAL:  53 y.o.-year-old patient lying in the bed with no acute distress.  EYES: Pupils equal, round, reactive to light and accommodation. No scleral icterus. Extraocular muscles intact.  HEENT: Head atraumatic, normocephalic. Oropharynx and nasopharynx clear.  NECK:  Supple, no jugular venous distention. No thyroid enlargement, no tenderness.  LUNGS: Normal breath sounds bilaterally, no wheezing, rales,rhonchi or crepitation. No use of accessory muscles of respiration.  CARDIOVASCULAR: S1, S2 normal. No murmurs, rubs, or gallops.  ABDOMEN: Soft, epigastric tenderness, no rebound tenderness nondistended.  Bowel sounds present. Marland Kitchen  EXTREMITIES: No pedal edema, cyanosis, or clubbing.  NEUROLOGIC: Awake alert and oriented x3 sensation intact. Gait not checked.  PSYCHIATRIC: The patient is alert and oriented x 3.  SKIN: No obvious rash, lesion, or ulcer.    LABORATORY PANEL:   CBC Recent Labs  Lab 01/01/19 0449  WBC 17.6*  HGB 18.0*  HCT 54.7*  PLT 124*   ------------------------------------------------------------------------------------------------------------------  Chemistries  Recent Labs  Lab 12/31/18 1042 01/01/19 0449  NA 135 133*  K 5.4* 4.8  CL 98 100  CO2 20* 17*  GLUCOSE 216* 190*  BUN 13 13  CREATININE 0.74 0.89  CALCIUM 9.4 8.6*  MG 2.0  --   AST 51*  --   ALT 53*  --   ALKPHOS 108  --   BILITOT 1.3*  --    ------------------------------------------------------------------------------------------------------------------  Cardiac Enzymes No results for input(s): TROPONINI in the last 168 hours. ------------------------------------------------------------------------------------------------------------------  RADIOLOGY:  Ct Abdomen Pelvis W Contrast  Result Date: 12/31/2018 CLINICAL DATA:  Elevated lipase and white blood cell count. Abdominal pain and bilateral flank pain with epigastric pain, nausea, and emesis. History pancreatitis. EXAM: CT ABDOMEN AND PELVIS WITH CONTRAST TECHNIQUE: Multidetector CT imaging of the abdomen and pelvis was performed using the standard protocol following bolus administration of intravenous contrast. CONTRAST:  116mL OMNIPAQUE IOHEXOL 300 MG/ML  SOLN COMPARISON:  07/06/2018 FINDINGS: Lower chest: Left lower lobe bandlike density on image 7/4 suggesting mild atelectasis or scarring. The basilar opacities have otherwise considerably improved compared to 07/06/2018. Hepatobiliary: Diffuse hepatic steatosis. Minimal spurring along the gallbladder fossa. Relatively high density in the gallbladder suggesting sludge or less likely  gallstones. Indistinctness of the porta hepatis due  to the peripancreatic inflammation. I see definite biliary dilatation but much of the extrahepatic biliary tree is obscured. Pancreas: Diffuse peripancreatic inflammatory stranding and acute peripancreatic fluid collections. A 2.8 by 2.8 by 1.5 cm hypodense lesion in the pancreatic tail could represent mass, pseudocyst, or region of prior pancreatic necrosis. There is expansion of the pancreatic head uncinate process most likely inflammatory, less likely neoplastic. Spleen: Unremarkable.  Patent splenic vein. Adrenals/Urinary Tract: Unremarkable Stomach/Bowel: Secondary inflammation of the duodenal bulb, descending duodenum, transverse duodenum. No bowel or gastric dilatation. Filling defects in the left abdominal small bowel likely represent food material such as fat doubles. Vascular/Lymphatic: Aortoiliac atherosclerotic vascular disease. Patent portal vein and splenic vein. Small celiac chain lymph nodes are present. These are likely reactive. No overtly pathologic adenopathy. Reproductive: Borderline prostatomegaly. Other: No supplemental non-categorized findings. Musculoskeletal: Small umbilical hernia contains adipose tissues. IMPRESSION: 1. Acute pancreatitis with pancreatic and peripancreatic edema as well as acute peripancreatic fluid collections. 2. A hypodense lesion within the tip of the tail the pancreas measuring 2.8 by 2.8 by 1.5 cm is most likely a pseudocyst, or focus of prior pancreatic necrosis. Pancreatic malignancy is considered a less likely differential diagnostic consideration in this setting. 3. Diffuse hepatic steatosis. 4. Sludge versus small gallstones in the gallbladder. Indistinct extrahepatic biliary tree due to the inflammation. 5. Mild secondary inflammation of the duodenum. 6.  Aortic Atherosclerosis (ICD10-I70.0). 7. Borderline prostatomegaly. 8. Small umbilical hernia contains adipose tissues. Electronically Signed   By: Van Clines M.D.   On: 12/31/2018 15:06    EKG:   Orders placed or performed during the hospital encounter of 12/31/18  . EKG 12-Lead  . EKG 12-Lead    ASSESSMENT AND PLAN:   Acute pancreatitis.  History of recurrent pancreatitis.  From alcohol  N.p.o. except ice and math, IV fluid support and pain control as needed  Follow-up lipase. CT scan results discussed with the patient Recommended to quit drinking PCP to repeat CT scan in approximately 6 weeks  Hyperkalemia.  IV fluid support and follow-up potassium.  Leukocytosis, possible due to reaction secondary to acute pancreatitis.  Follow-up CBC.  Alcohol abuse.  CIWA protocol.  Outpatient follow-up with [facility]  Diffuse hepatic steatosis.  Due to alcohol hyperlipidemia.  Follow-up PCP. Sludge versus small gallstones in the gallbladder.  May need surgery in the future.  Hyperlipidemia.  Continue Zocor.  Tobacco abuse.  Smoking cessation was counseled for 3 to 4 minutes, nicotine patch.     All the records are reviewed and case discussed with Care Management/Social Workerr. Management plans discussed with the patient, family and they are in agreement.  CODE STATUS: fc   TOTAL TIME TAKING CARE OF THIS PATIENT: 39  minutes.   POSSIBLE D/C IN 1-2  DAYS, DEPENDING ON CLINICAL CONDITION.  Note: This dictation was prepared with Dragon dictation along with smaller phrase technology. Any transcriptional errors that result from this process are unintentional.   Nicholes Mango M.D on 01/01/2019 at 4:49 PM  Between 7am to 6pm - Pager - [phone] After 6pm go to [email or web address] - password [email or web address] [facility]  [facility]  Office  [phone]  CC: Primary care physician; Kirk Ruths, MD

## 2019-01-01 NOTE — Progress Notes (Signed)
Patient reports that he is allergic to morphine and he stopped breathing in the past but still insisting to give him morphine or Dilaudid iv. discussed with pharmacy, recommending to give morphine with small dose with extra caution. Notified RN

## 2019-01-02 LAB — CBC
HEMATOCRIT: 48 % (ref 39.0–52.0)
Hemoglobin: 15.5 g/dL (ref 13.0–17.0)
MCH: 32.7 pg (ref 26.0–34.0)
MCHC: 32.3 g/dL (ref 30.0–36.0)
MCV: 101.3 fL — ABNORMAL HIGH (ref 80.0–100.0)
Platelets: 96 10*3/uL — ABNORMAL LOW (ref 150–400)
RBC: 4.74 MIL/uL (ref 4.22–5.81)
RDW: 15.2 % (ref 11.5–15.5)
WBC: 12.8 10*3/uL — ABNORMAL HIGH (ref 4.0–10.5)
nRBC: 0 % (ref 0.0–0.2)

## 2019-01-02 LAB — COMPREHENSIVE METABOLIC PANEL
ALT: 23 U/L (ref 0–44)
AST: 21 U/L (ref 15–41)
Albumin: 3.4 g/dL — ABNORMAL LOW (ref 3.5–5.0)
Alkaline Phosphatase: 64 U/L (ref 38–126)
Anion gap: 10 (ref 5–15)
BUN: 12 mg/dL (ref 6–20)
CO2: 14 mmol/L — ABNORMAL LOW (ref 22–32)
Calcium: 8 mg/dL — ABNORMAL LOW (ref 8.9–10.3)
Chloride: 107 mmol/L (ref 98–111)
Creatinine, Ser: 0.61 mg/dL (ref 0.61–1.24)
GFR calc Af Amer: >60 mL/min (ref >60)
GFR calc non Af Amer: >60 mL/min (ref >60)
Glucose, Bld: 140 mg/dL — ABNORMAL HIGH (ref 70–99)
Potassium: 3.6 mmol/L (ref 3.5–5.1)
Sodium: 131 mmol/L — ABNORMAL LOW (ref 135–145)
Total Bilirubin: 1.5 mg/dL — ABNORMAL HIGH (ref 0.3–1.2)
Total Protein: 6.2 g/dL — ABNORMAL LOW (ref 6.5–8.1)

## 2019-01-02 LAB — LIPASE, BLOOD: Lipase: 175 U/L — ABNORMAL HIGH (ref 11–51)

## 2019-01-02 MED ORDER — ALPRAZOLAM 0.5 MG PO TABS
0.5000 mg | ORAL_TABLET | Freq: Four times a day (QID) | ORAL | Status: DC
  Administered 2019-01-02 (×2): 0.5 mg via ORAL
  Filled 2019-01-02 (×2): qty 1

## 2019-01-02 MED ORDER — IBUPROFEN 800 MG PO TABS: 600.0000 mg | ORAL_TABLET | Freq: Three times a day (TID) | ORAL | 0 refills | Status: DC | PRN

## 2019-01-02 MED ORDER — ACETAMINOPHEN 325 MG PO TABS: 650.0000 mg | ORAL_TABLET | Freq: Four times a day (QID) | ORAL | Status: DC | PRN

## 2019-01-02 MED ORDER — FOLIC ACID 1 MG PO TABS: 1.0000 mg | ORAL_TABLET | Freq: Every day | ORAL | 0 refills | Status: DC

## 2019-01-02 MED ORDER — THIAMINE HCL 100 MG PO TABS: 100.0000 mg | ORAL_TABLET | Freq: Every day | ORAL | 0 refills | Status: DC

## 2019-01-02 MED ORDER — ALUM & MAG HYDROXIDE-SIMETH 200-200-20 MG/5ML PO SUSP: 30.0000 mL | Freq: Four times a day (QID) | ORAL | Status: DC | PRN

## 2019-01-02 MED ORDER — ADULT MULTIVITAMIN W/MINERALS CH: 1.0000 | ORAL_TABLET | Freq: Every day | ORAL | Status: DC

## 2019-01-02 NOTE — Care Management Important Message (Signed)
Important Message  Patient Details  Name: Shaun Odonnell MRN: [id number] Date of Birth: 04/13/1966   Medicare Important Message Given:  Yes    Juliann Pulse A Ranson Belluomini 01/02/2019, 11:57 AM

## 2019-01-02 NOTE — Progress Notes (Signed)
Patient is being discharged to home this afternoon. DC & RX instructions given and patient acknowledged understanding. IV removed. Patients dad here to take him home.

## 2019-01-02 NOTE — Discharge Instructions (Signed)
Follow-up with primary care physician in 3 days Outpatient follow-up with [facility]

## 2019-01-02 NOTE — Discharge Summary (Signed)
[place] [facility] [place] NAME: Shaun Odonnell    MR#:  [id number]  DATE OF BIRTH:  [birth year]  DATE OF ADMISSION:  12/31/2018 ADMITTING PHYSICIAN: Demetrios Loll, MD  DATE OF DISCHARGE:  01/02/19   PRIMARY CARE PHYSICIAN: Kirk Ruths, MD    ADMISSION DIAGNOSIS:  Dehydration [E86.0] Acute pancreatitis, unspecified complication status, unspecified pancreatitis type [K85.90]  DISCHARGE DIAGNOSIS:  Active Problems:   Acute pancreatitis   SECONDARY DIAGNOSIS:   Past Medical History:  Diagnosis Date  . ARDS (adult respiratory distress syndrome) ([place])   . ARDS (adult respiratory distress syndrome) ([place])   . Bipolar disorder ([place])   . Borderline diabetes   . COPD (chronic obstructive pulmonary disease) ([facility])    chronic cough and wheezing  . Depression   . Dizziness    medication related  . Dyspnea    with exertion  . Elevated lipids   . Fatty liver   . Hypercholesterolemia   . Pancreatitis   . Pneumonia    in past  . Pneumonia    in past  . Pre-diabetes    diet controlled  . Pulmonary embolism ([place])   . Schizoaffective disorder [facility])     HOSPITAL COURSE:   hpi Shaun Odonnell  is a 54 y.o. male with a known history of recurrent pancreatitis, ARDS, COPD, pneumonia, hyperlipidemia, fatty liver, pulmonary embolism and schizoaffective disorder.  The patient presents the ED with above chief complaints.  He started to have a nausea, vomiting and severe abdominal pain across the abdomen, constant and 10 out of 10 since this morning.  He denies any daily alcohol drinking but drink twice a week.  His last drink was 2 days ago.  He denies any anxiety or tremor.  His lipase is elevated more than thousand, CT scan of the abdomen report acute pancreatitis with peripancreatic edema.  Acute pancreatitis. History of recurrent pancreatitis.  From alcohol clinically better  Lipase is trending down and patient is tolerating diet.  Pain  well controlled and wants to go home CT scan results discussed with the patient Recommended to quit drinking PCP to repeat CT scan in approximately 6 weeks  Hyperkalemia. IV fluid support and follow-up potassium 4.8  Leukocytosis, possible due to reaction secondary to acute pancreatitis. Follow-up CBC.  Trending down to 12.8  Alcohol abuse. CIWA protocol.  Outpatient follow-up with [facility]  Diffuse hepatic steatosis.Due to alcohol hyperlipidemia. Follow-up PCP. Sludge versus small gallstones in the gallbladder.May need surgery in the future.  PCP to monitor patient closely  Hyperlipidemia. Continue Zocor.  Tobacco abuse. Smoking cessation was counseled for 3 to 4 minutes, nicotine patch.  Discharge patient home DISCHARGE CONDITIONS:   stable  CONSULTS OBTAINED:     PROCEDURES  None   DRUG ALLERGIES:   Allergies  Allergen Reactions  . Klonopin [Clonazepam]   . Morphine And Related Other (See Comments)    Stopped breathing in the past  . Pimozide   . Stelazine [Trifluoperazine]     DISCHARGE MEDICATIONS:   Allergies as of 01/02/2019      Reactions   Klonopin [clonazepam]    Morphine And Related Other (See Comments)   Stopped breathing in the past   Pimozide    Stelazine [trifluoperazine]       Medication List    TAKE these medications   albuterol 108 (90 Base) MCG/ACT inhaler Commonly known as:  PROVENTIL HFA;VENTOLIN HFA Inhale 2 puffs into the lungs every 6 (six)  hours as needed for wheezing or shortness of breath.   ALPRAZolam 0.5 MG tablet Commonly known as:  XANAX Take 0.5 mg by mouth 4 (four) times daily. Patient takes when wakes up(~0600)/ 5409/ 8119/ 1478   folic acid 1 MG tablet Commonly known as:  FOLVITE Take 1 tablet (1 mg total) by mouth daily. Start taking on:  January 03, 2019   ibuprofen 800 MG tablet Commonly known as:  ADVIL,MOTRIN Take 1 tablet (800 mg total) by mouth every 8 (eight) hours as needed (take  with food).   JARDIANCE 25 MG Tabs tablet Generic drug:  empagliflozin Take 25 mg by mouth daily.   multivitamin with minerals Tabs tablet Take 1 tablet by mouth daily. Start taking on:  January 03, 2019   nicotine 14 mg/24hr patch Commonly known as:  NICODERM CQ - dosed in mg/24 hours Place 1 patch (14 mg total) onto the skin daily.   OLANZapine 15 MG tablet Commonly known as:  ZYPREXA Take 15 mg by mouth daily at 8 pm. At 2000   sertraline 100 MG tablet Commonly known as:  ZOLOFT Take 100 mg by mouth daily with breakfast.   simvastatin 40 MG tablet Commonly known as:  ZOCOR Take 40 mg by mouth daily. Takes At 1600   thiamine 100 MG tablet Take 1 tablet (100 mg total) by mouth daily. Start taking on:  January 03, 2019        DISCHARGE INSTRUCTIONS:   Follow-up with primary care physician in 3 days Outpatient follow-up with [facility]  DIET:  Diabetic diet  DISCHARGE CONDITION:  Fair  ACTIVITY:  Activity as tolerated  OXYGEN:  Home Oxygen: No.   Oxygen Delivery: room air  DISCHARGE LOCATION:  home   If you experience worsening of your admission symptoms, develop shortness of breath, life threatening emergency, suicidal or homicidal thoughts you must seek medical attention immediately by calling 911 or calling your MD immediately  if symptoms less severe.  You Must read complete instructions/literature along with all the possible adverse reactions/side effects for all the Medicines you take and that have been prescribed to you. Take any new Medicines after you have completely understood and accpet all the possible adverse reactions/side effects.   Please note  You were cared for by a hospitalist during your hospital stay. If you have any questions about your discharge medications or the care you received while you were in the hospital after you are discharged, you can call the unit and asked to speak with the hospitalist on call if the hospitalist  that took care of you is not available. Once you are discharged, your primary care physician will handle any further medical issues. Please note that NO REFILLS for any discharge medications will be authorized once you are discharged, as it is imperative that you return to your primary care physician (or establish a relationship with a primary care physician if you do not have one) for your aftercare needs so that they can reassess your need for medications and monitor your lab values.     Today  Chief Complaint  Patient presents with  . Abdominal Pain   Patient is feeling much better.  Abdominal pain down to 2.  Tolerating clear liquids and advance of diet.  Wants to go home.  ROS:  CONSTITUTIONAL: Denies fevers, chills. Denies any fatigue, weakness.  EYES: Denies blurry vision, double vision, eye pain. EARS, NOSE, THROAT: Denies tinnitus, ear pain, hearing loss. RESPIRATORY: Denies cough, wheeze, shortness of  breath.  CARDIOVASCULAR: Denies chest pain, palpitations, edema.  GASTROINTESTINAL: Denies nausea, vomiting, diarrhea, abdominal pain. Denies bright red blood per rectum. GENITOURINARY: Denies dysuria, hematuria. ENDOCRINE: Denies nocturia or thyroid problems. HEMATOLOGIC AND LYMPHATIC: Denies easy bruising or bleeding. SKIN: Denies rash or lesion. MUSCULOSKELETAL: Denies pain in neck, back, shoulder, knees, hips or arthritic symptoms.  NEUROLOGIC: Denies paralysis, paresthesias.  PSYCHIATRIC: Denies anxiety or depressive symptoms.   VITAL SIGNS:  Blood pressure 129/69, pulse 88, temperature (!) 97.4 F (36.3 C), temperature source Oral, resp. rate 13, height 5\' 8"  (1.727 m), weight 88.9 kg, SpO2 95 %.  I/O:    Intake/Output Summary (Last 24 hours) at 01/02/2019 1351 Last data filed at 01/02/2019 0735 Gross per 24 hour  Intake 2718.6 ml  Output 1900 ml  Net 818.6 ml    PHYSICAL EXAMINATION:  GENERAL:  53 y.o.-year-old patient lying in the bed with no acute distress.   EYES: Pupils equal, round, reactive to light and accommodation. No scleral icterus. Extraocular muscles intact.  HEENT: Head atraumatic, normocephalic. Oropharynx and nasopharynx clear.  NECK:  Supple, no jugular venous distention. No thyroid enlargement, no tenderness.  LUNGS: Normal breath sounds bilaterally, no wheezing, rales,rhonchi or crepitation. No use of accessory muscles of respiration.  CARDIOVASCULAR: S1, S2 normal. No murmurs, rubs, or gallops.  ABDOMEN: Soft, non-tender, non-distended. Bowel sounds present. No organomegaly or mass.  EXTREMITIES: No pedal edema, cyanosis, or clubbing.  NEUROLOGIC: Awake, alert and oriented x3. Sensation intact. Gait not checked.  PSYCHIATRIC: The patient is alert and oriented x 3.  SKIN: No obvious rash, lesion, or ulcer.   DATA REVIEW:   CBC Recent Labs  Lab 01/02/19 0430  WBC 12.8*  HGB 15.5  HCT 48.0  PLT 96*    Chemistries  Recent Labs  Lab 12/31/18 1042  01/02/19 0430  NA 135   < > 131*  K 5.4*   < > 3.6  CL 98   < > 107  CO2 20*   < > 14*  GLUCOSE 216*   < > 140*  BUN 13   < > 12  CREATININE 0.74   < > 0.61  CALCIUM 9.4   < > 8.0*  MG 2.0  --   --   AST 51*  --  21  ALT 53*  --  23  ALKPHOS 108  --  64  BILITOT 1.3*  --  1.5*   < > = values in this interval not displayed.    Cardiac Enzymes No results for input(s): TROPONINI in the last 168 hours.  Microbiology Results  No results found for this or any previous visit.  RADIOLOGY:  Ct Abdomen Pelvis W Contrast  Result Date: 12/31/2018 CLINICAL DATA:  Elevated lipase and white blood cell count. Abdominal pain and bilateral flank pain with epigastric pain, nausea, and emesis. History pancreatitis. EXAM: CT ABDOMEN AND PELVIS WITH CONTRAST TECHNIQUE: Multidetector CT imaging of the abdomen and pelvis was performed using the standard protocol following bolus administration of intravenous contrast. CONTRAST:  187mL OMNIPAQUE IOHEXOL 300 MG/ML  SOLN COMPARISON:   07/06/2018 FINDINGS: Lower chest: Left lower lobe bandlike density on image 7/4 suggesting mild atelectasis or scarring. The basilar opacities have otherwise considerably improved compared to 07/06/2018. Hepatobiliary: Diffuse hepatic steatosis. Minimal spurring along the gallbladder fossa. Relatively high density in the gallbladder suggesting sludge or less likely gallstones. Indistinctness of the porta hepatis due to the peripancreatic inflammation. I see definite biliary dilatation but much of the extrahepatic biliary tree is  obscured. Pancreas: Diffuse peripancreatic inflammatory stranding and acute peripancreatic fluid collections. A 2.8 by 2.8 by 1.5 cm hypodense lesion in the pancreatic tail could represent mass, pseudocyst, or region of prior pancreatic necrosis. There is expansion of the pancreatic head uncinate process most likely inflammatory, less likely neoplastic. Spleen: Unremarkable.  Patent splenic vein. Adrenals/Urinary Tract: Unremarkable Stomach/Bowel: Secondary inflammation of the duodenal bulb, descending duodenum, transverse duodenum. No bowel or gastric dilatation. Filling defects in the left abdominal small bowel likely represent food material such as fat doubles. Vascular/Lymphatic: Aortoiliac atherosclerotic vascular disease. Patent portal vein and splenic vein. Small celiac chain lymph nodes are present. These are likely reactive. No overtly pathologic adenopathy. Reproductive: Borderline prostatomegaly. Other: No supplemental non-categorized findings. Musculoskeletal: Small umbilical hernia contains adipose tissues. IMPRESSION: 1. Acute pancreatitis with pancreatic and peripancreatic edema as well as acute peripancreatic fluid collections. 2. A hypodense lesion within the tip of the tail the pancreas measuring 2.8 by 2.8 by 1.5 cm is most likely a pseudocyst, or focus of prior pancreatic necrosis. Pancreatic malignancy is considered a less likely differential diagnostic consideration in  this setting. 3. Diffuse hepatic steatosis. 4. Sludge versus small gallstones in the gallbladder. Indistinct extrahepatic biliary tree due to the inflammation. 5. Mild secondary inflammation of the duodenum. 6.  Aortic Atherosclerosis (ICD10-I70.0). 7. Borderline prostatomegaly. 8. Small umbilical hernia contains adipose tissues. Electronically Signed   By: Van Clines M.D.   On: 12/31/2018 15:06    EKG:   Orders placed or performed during the hospital encounter of 12/31/18  . EKG 12-Lead  . EKG 12-Lead      Management plans discussed with the patient, family and they are in agreement.  CODE STATUS:     Code Status Orders  (From admission, onward)         Start     Ordered   12/31/18 1643  Full code  Continuous     12/31/18 1642        Code Status History    Date Active Date Inactive Code Status Order ID Comments User Context   07/02/2018 1105 07/09/2018 1910 Full Code [id number]  Bettey Costa, MD Inpatient      TOTAL TIME TAKING CARE OF THIS PATIENT: 43 minutes.   Note: This dictation was prepared with Dragon dictation along with smaller phrase technology. Any transcriptional errors that result from this process are unintentional.   @[facility] @  on 01/02/2019 at 1:51 PM  Between 7am to 6pm - Pager - [phone]  After 6pm go to [email or web address] - password [email or web address] [facility]  [facility]  Office  [phone]  CC: Primary care physician; Kirk Ruths, MD

## 2019-01-02 NOTE — Progress Notes (Deleted)
Patient is being discharged to home with [facility]. IV removed, DC & RX instructions given and patient acknowledged understanding. Patient understands that he needs to call his PCP to schedule an appt regarding his bradycardia. Belongings Packed and NT took patient to daughters car.

## 2019-02-09 ENCOUNTER — Encounter: Payer: Self-pay | Admitting: *Deleted

## 2019-02-17 ENCOUNTER — Other Ambulatory Visit: Payer: Self-pay

## 2019-02-17 NOTE — Telephone Encounter (Signed)
Patient is requesting a refill on his Simvastatin 40 mg, 90 day supply due to relationship been terminate with his previous doctor. Patient has a new patient appointment with Adriana on 04/29/2019. Please advise.

## 2019-02-17 NOTE — Telephone Encounter (Signed)
Called patient regarding prescription. He states that he was able to get a refill on the Zocor. He also asked what can he do if needs refills on other medications. I offered him to do an e-visit earlier to get established. He states that he will call back and schedule.

## 2019-02-17 NOTE — Telephone Encounter (Signed)
He needs to contact his previous PCP. I have never seen this patient and have no existing relationship with him by which to prescribe medications.

## 2019-02-23 ENCOUNTER — Other Ambulatory Visit: Payer: Self-pay

## 2019-02-23 ENCOUNTER — Ambulatory Visit (INDEPENDENT_AMBULATORY_CARE_PROVIDER_SITE_OTHER): Payer: Medicare Other

## 2019-02-23 ENCOUNTER — Ambulatory Visit
Admission: EM | Admit: 2019-02-23 | Discharge: 2019-02-23 | Disposition: A | Discharge status: Home / Self Care | Payer: Medicare Other | Attending: Family Medicine | Admitting: Family Medicine

## 2019-02-23 ENCOUNTER — Encounter: Payer: Self-pay | Admitting: Emergency Medicine

## 2019-02-23 DIAGNOSIS — R05 Cough: Secondary | ICD-10-CM

## 2019-02-23 DIAGNOSIS — R112 Nausea with vomiting, unspecified: Secondary | ICD-10-CM

## 2019-02-23 DIAGNOSIS — R059 Cough, unspecified: Secondary | ICD-10-CM

## 2019-02-23 MED ORDER — HYDROCOD POLST-CPM POLST ER 10-8 MG/5ML PO SUER: 5.0000 mL | Freq: Two times a day (BID) | ORAL | 0 refills | Status: DC | PRN

## 2019-02-23 MED ORDER — PREDNISONE 20 MG PO TABS: ORAL_TABLET | ORAL | 0 refills | Status: DC

## 2019-02-23 MED ORDER — ONDANSETRON 8 MG PO TBDP: 8.0000 mg | ORAL_TABLET | Freq: Three times a day (TID) | ORAL | 0 refills | Status: DC | PRN

## 2019-02-23 NOTE — Discharge Instructions (Signed)
Use albuterol inhaler four times daily Rest, fluids

## 2019-02-23 NOTE — ED Triage Notes (Signed)
Patient currently on Augmentin and started it on Friday.

## 2019-02-23 NOTE — ED Triage Notes (Signed)
Patient c/o cough, chills, and chest congestion for 2 months.  Patient reports HAs also.  Patient denies fevers.

## 2019-02-24 ENCOUNTER — Telehealth: Payer: Self-pay

## 2019-02-24 MED ORDER — ALBUTEROL SULFATE HFA 108 (90 BASE) MCG/ACT IN AERS: 2.0000 | INHALATION_SPRAY | Freq: Four times a day (QID) | RESPIRATORY_TRACT | 1 refills | Status: DC | PRN

## 2019-03-02 ENCOUNTER — Telehealth: Payer: Self-pay

## 2019-03-02 MED ORDER — HYDROCOD POLST-CPM POLST ER 10-8 MG/5ML PO SUER: 5.0000 mL | Freq: Every evening | ORAL | 0 refills | Status: DC | PRN

## 2019-03-02 NOTE — ED Provider Notes (Signed)
[facility]    CSN: [id number] Arrival date & time: 02/23/19  1401     History   Chief Complaint Chief Complaint  Patient presents with  . Cough  . Headache    HPI Shaun Odonnell is a 53 y.o. male.   The history is provided by the patient.  Cough  Cough characteristics:  Non-productive Associated symptoms: headaches   Headache  Associated symptoms: congestion, cough, fatigue and URI   URI  Presenting symptoms: congestion, cough and fatigue   Severity:  Moderate Onset quality:  Sudden Duration:  2 months Timing:  Constant Progression:  Unchanged Chronicity:  New Relieved by:  Nothing Associated symptoms: headaches     Past Medical History:  Diagnosis Date  . ARDS (adult respiratory distress syndrome) ([place])   . ARDS (adult respiratory distress syndrome) ([place])   . Bipolar disorder ([place])   . Borderline diabetes   . COPD (chronic obstructive pulmonary disease) ([facility])    chronic cough and wheezing  . Depression   . Dizziness    medication related  . Dyspnea    with exertion  . Elevated lipids   . Fatty liver   . Hypercholesterolemia   . Pancreatitis   . Pneumonia    in past  . Pneumonia    in past  . Pre-diabetes    diet controlled  . Pulmonary embolism ([place])   . Schizoaffective disorder [facility])     Patient Active Problem List   Diagnosis Date Noted  . Acute pancreatitis 12/31/2018  . Pancreatitis 07/02/2018    Past Surgical History:  Procedure Laterality Date  . CATARACT EXTRACTION W/PHACO Right 03/26/2018   Procedure: CATARACT EXTRACTION PHACO AND INTRAOCULAR LENS PLACEMENT ([place]) RIGHT BORDERLINE DIABETIC;  Surgeon: Leandrew Koyanagi, MD;  Location: [facility];  Service: Ophthalmology;  Laterality: Right;  . CATARACT EXTRACTION W/PHACO Left 04/23/2018   Procedure: CATARACT EXTRACTION PHACO AND INTRAOCULAR LENS PLACEMENT ([place])  LEFT BORDERLINE DIABETIC;  Surgeon: Leandrew Koyanagi, MD;  Location: [place];   Service: Ophthalmology;  Laterality: Left;  . COLONOSCOPY WITH PROPOFOL N/A 08/21/2017   Procedure: COLONOSCOPY WITH PROPOFOL;  Surgeon: Manya Silvas, MD;  Location[facility] ENDOSCOPY;  Service: Endoscopy;  Laterality: N/A;  . HERNIA REPAIR         Home Medications    Prior to Admission medications   Medication Sig Start Date End Date Taking? Authorizing Provider  ALPRAZolam Duanne Moron) 0.5 MG tablet Take 0.5 mg by mouth 4 (four) times daily. Patient takes when wakes up(~0600)/ 1000/ 1600/ 2000 06/26/18  Yes [provider]  amoxicillin-clavulanate (AUGMENTIN) 875-125 MG tablet Take 1 tablet by mouth 2 (two) times daily.   Yes [provider]  empagliflozin (JARDIANCE) 25 MG TABS tablet Take 25 mg by mouth daily.   Yes [provider]  Multiple Vitamin (MULTIVITAMIN WITH MINERALS) TABS tablet Take 1 tablet by mouth daily. 01/03/19  Yes Gouru, Aruna, MD  OLANZapine (ZYPREXA) 15 MG tablet Take 15 mg by mouth daily at 8 pm. At 2000   Yes [provider]  sertraline (ZOLOFT) 100 MG tablet Take 100 mg by mouth daily with breakfast.    Yes [provider]  simvastatin (ZOCOR) 40 MG tablet Take 40 mg by mouth daily. Takes At 1600   Yes [provider]  albuterol (VENTOLIN HFA) 108 (90 Base) MCG/ACT inhaler Inhale 2 puffs into the lungs every 6 (six) hours as needed for wheezing or shortness of breath. 02/24/19   Coral Spikes,  DO  chlorpheniramine-HYDROcodone (TUSSIONEX PENNKINETIC ER) 10-8 MG/5ML SUER Take 5 mLs by mouth every 12 (twelve) hours as needed. 02/23/19   Norval Gable, MD  folic acid (FOLVITE) 1 MG tablet Take 1 tablet (1 mg total) by mouth daily. 01/03/19   Nicholes Mango, MD  ibuprofen (ADVIL,MOTRIN) 800 MG tablet Take 1 tablet (800 mg total) by mouth every 8 (eight) hours as needed (take with food). 01/02/19   Gouru, Illene Silver, MD  nicotine (NICODERM CQ - DOSED IN MG/24 HOURS) 14 mg/24hr patch Place 1 patch (14 mg total) onto the skin daily.  Patient not taking: Reported on 12/31/2018 07/10/18   Bettey Costa, MD  ondansetron (ZOFRAN ODT) 8 MG disintegrating tablet Take 1 tablet (8 mg total) by mouth every 8 (eight) hours as needed. 02/23/19   Norval Gable, MD  predniSONE (DELTASONE) 20 MG tablet 2 tabs po qd x 2 days, then 1 tab po qd x 2 days, then half a tab po qd x 2 days 02/23/19   Norval Gable, MD  thiamine 100 MG tablet Take 1 tablet (100 mg total) by mouth daily. 01/03/19   Nicholes Mango, MD    Family History Family History  Problem Relation Age of Onset  . Healthy Mother   . Healthy Father     Social History Social History   Tobacco Use  . Smoking status: Current Every Day Smoker    Packs/day: 1.00    Years: 30.00    Pack years: 30.00    Types: Cigarettes  . Smokeless tobacco: Former Systems developer    Types: Snuff  Substance Use Topics  . Alcohol use: Yes    Alcohol/week: 4.0 standard drinks    Types: 4 Cans of beer per week    Comment: history of, none since February   . Drug use: Not Currently     Allergies   Fentanyl; Klonopin [clonazepam]; Morphine and related; Pimozide; and Stelazine [trifluoperazine]   Review of Systems Review of Systems  Constitutional: Positive for fatigue.  HENT: Positive for congestion.   Respiratory: Positive for cough.   Neurological: Positive for headaches.     Physical Exam Triage Vital Signs ED Triage Vitals  Enc Vitals Group     BP 02/23/19 1414 130/89     Pulse Rate 02/23/19 1414 86     Resp 02/23/19 1414 16     Temp 02/23/19 1414 98 F (36.7 C)     Temp Source 02/23/19 1414 Oral     SpO2 02/23/19 1414 98 %     Weight 02/23/19 1410 185 lb (83.9 kg)     Height 02/23/19 1410 5\' 8"  (1.727 m)     Head Circumference --      Peak Flow --      Pain Score 02/23/19 1410 0     Pain Loc --      Pain Edu? --      Excl. in [place]? --    No data found.  Updated Vital Signs BP 130/89 (BP Location: Left Arm)   Pulse 86   Temp 98 F (36.7 C) (Oral)   Resp 16   Ht 5\' 8"   (1.727 m)   Wt 83.9 kg   SpO2 98%   BMI 28.13 kg/m   Visual Acuity Right Eye Distance:   Left Eye Distance:   Bilateral Distance:    Right Eye Near:   Left Eye Near:    Bilateral Near:     Physical Exam Vitals signs reviewed.  Constitutional:  General: He is not in acute distress.    Appearance: He is not toxic-appearing or diaphoretic.  Cardiovascular:     Rate and Rhythm: Normal rate.  Pulmonary:     Effort: Pulmonary effort is normal. No respiratory distress.  Neurological:     Mental Status: He is alert.      UC Treatments / Results  Labs (all labs ordered are listed, but only abnormal results are displayed) Labs Reviewed - No data to display  EKG None  Radiology No results found.  Procedures Procedures (including critical care time)  Medications Ordered in UC Medications - No data to display  Initial Impression / Assessment and Plan / UC Course  I have reviewed the triage vital signs and the nursing notes.  Pertinent labs & imaging results that were available during my care of the patient were reviewed by me and considered in my medical decision making (see chart for details).     Final Clinical Impressions(s) / UC Diagnoses   Final diagnoses:  Cough  Non-intractable vomiting with nausea, unspecified vomiting type     Discharge Instructions     Use albuterol inhaler four times daily Rest, fluids    ED Prescriptions    Medication Sig Dispense Auth. Provider   predniSONE (DELTASONE) 20 MG tablet 2 tabs po qd x 2 days, then 1 tab po qd x 2 days, then half a tab po qd x 2 days 7 tablet Ferrel Simington, MD   ondansetron (ZOFRAN ODT) 8 MG disintegrating tablet Take 1 tablet (8 mg total) by mouth every 8 (eight) hours as needed. 6 tablet Norval Gable, MD   chlorpheniramine-HYDROcodone (TUSSIONEX PENNKINETIC ER) 10-8 MG/5ML SUER Take 5 mLs by mouth every 12 (twelve) hours as needed. 60 mL Norval Gable, MD     1. Chest x-ray result and  diagnosis reviewed with patient 2. rx as per orders above; reviewed possible side effects, interactions, risks and benefits  3. Recommend supportive treatment as above 4. Follow-up prn if symptoms worsen or don't improve   Controlled Substance Prescriptions [place] Controlled Substance Registry consulted? Not Applicable   Norval Gable, MD 03/02/19 [phone]

## 2019-03-02 NOTE — Telephone Encounter (Signed)
Pt called stating he is getting better and has finished his steroid taper but still has his slight cough and wants a refill on the tussionex to get him "over the hump" I told him It was a controlled substance and he probably need to be re seen but I told him I would ask.

## 2019-03-13 ENCOUNTER — Telehealth: Payer: Self-pay | Admitting: Emergency Medicine

## 2019-03-13 NOTE — Telephone Encounter (Signed)
Patient called requesting a refill for Zofran and a prescription for Tessalon perles. Advised patient per last note from Dr. Zenda Alpers that if patient continued to have symptoms he would need to be re evaluated. Patient stated he would come in to be seen.

## 2019-03-19 ENCOUNTER — Telehealth: Payer: Self-pay

## 2019-03-19 NOTE — Telephone Encounter (Signed)
Patient states that he has been coughing for 1 month. He has a new patient appointment on 04/29/2019, but needs some Hydrocodone cough syrup. He is coughing so much that he is throwing up. Please advise. [facility].

## 2019-03-19 NOTE — Telephone Encounter (Signed)
Patient was advised.  

## 2019-03-19 NOTE — Telephone Encounter (Signed)
No. I have never seen this patient. I have no provider relationship with him and I am not providing him with any medications. He is NOT a patient at this clinic until he establishes and should not request medication from [place].

## 2019-03-27 ENCOUNTER — Ambulatory Visit: Payer: Self-pay

## 2019-03-27 NOTE — Telephone Encounter (Signed)
Pt called not established with our providers. He states that since last night he has had abdominal pain that radiates his entire abdomin. He states that he has been gassy for several days and has had frequent small BM's that are soft. He states that he vomited food stuff from the night before. He states that the pain is moderate 5-6. He has a knot just above his umbilicus. Per protocol pt need to be seen today. Pt does not wish to establish care with any of our practices. He will go to ER for evaluation of his symptoms. Care advice read to patient. Patient verbalized understanding of all instructions.   Reason for Disposition . [1] MILD-MODERATE pain AND [2] constant AND [3] present > 2 hours  Answer Assessment - Initial Assessment Questions 1. LOCATION: "Where does it hurt?"      Whole abdomin at umbilicus 2. RADIATION: "Does the pain shoot anywhere else?" (e.g., chest, back)     All over abdomin chest 3. ONSET: "When did the pain begin?" (Minutes, hours or days ago)      Woke up today with it 4. SUDDEN: "Gradual or sudden onset?"     Sudden 5. PATTERN "Does the pain come and go, or is it constant?"    - If constant: "Is it getting better, staying the same, or worsening?"      (Note: Constant means the pain never goes away completely; most serious pain is constant and it progresses)     - If intermittent: "How long does it last?" "Do you have pain now?"     (Note: Intermittent means the pain goes away completely between bouts)     constant 6. SEVERITY: "How bad is the pain?"  (e.g., Scale 1-10; mild, moderate, or severe)    - MILD (1-3): doesn't interfere with normal activities, abdomen soft and not tender to touch     - MODERATE (4-7): interferes with normal activities or awakens from sleep, tender to touch     - SEVERE (8-10): excruciating pain, doubled over, unable to do any normal activities       5-6 7. RECURRENT SYMPTOM: "Have you ever had this type of abdominal pain before?" If so,  ask: "When was the last time?" and "What happened that time?"      No 8. CAUSE: "What do you think is causing the abdominal pain?"     Soft frequest BM a lot of gas 9. RELIEVING/AGGRAVATING FACTORS: "What makes it better or worse?" (e.g., movement, antacids, bowel movement)     Movement and BM help some 10. OTHER SYMPTOMS: "Has there been any vomiting, diarrhea, constipation, or urine problems?"       Pain with urination burning a ittle  Protocols used: ABDOMINAL PAIN - MALE-A-AH

## 2019-03-28 ENCOUNTER — Encounter: Payer: Self-pay | Admitting: Emergency Medicine

## 2019-03-28 ENCOUNTER — Ambulatory Visit (INDEPENDENT_AMBULATORY_CARE_PROVIDER_SITE_OTHER)
Admission: EM | Admit: 2019-03-28 | Discharge: 2019-03-28 | Disposition: A | Discharge status: Nursing Home (Includes ICF) | Payer: Medicare Other | Source: Home / Self Care

## 2019-03-28 ENCOUNTER — Other Ambulatory Visit: Payer: Self-pay

## 2019-03-28 ENCOUNTER — Emergency Department: Payer: Medicare Other

## 2019-03-28 ENCOUNTER — Emergency Department
Admission: EM | Admit: 2019-03-28 | Discharge: 2019-03-28 | Disposition: A | Discharge status: Home / Self Care | Payer: Medicare Other | Attending: Emergency Medicine | Admitting: Emergency Medicine

## 2019-03-28 DIAGNOSIS — R05 Cough: Secondary | ICD-10-CM

## 2019-03-28 DIAGNOSIS — Z79899 Other long term (current) drug therapy: Secondary | ICD-10-CM | POA: Insufficient documentation

## 2019-03-28 DIAGNOSIS — R112 Nausea with vomiting, unspecified: Secondary | ICD-10-CM | POA: Insufficient documentation

## 2019-03-28 DIAGNOSIS — J449 Chronic obstructive pulmonary disease, unspecified: Secondary | ICD-10-CM | POA: Insufficient documentation

## 2019-03-28 DIAGNOSIS — F17210 Nicotine dependence, cigarettes, uncomplicated: Secondary | ICD-10-CM | POA: Insufficient documentation

## 2019-03-28 DIAGNOSIS — R7303 Prediabetes: Secondary | ICD-10-CM | POA: Insufficient documentation

## 2019-03-28 DIAGNOSIS — R1033 Periumbilical pain: Secondary | ICD-10-CM | POA: Diagnosis not present

## 2019-03-28 DIAGNOSIS — K429 Umbilical hernia without obstruction or gangrene: Secondary | ICD-10-CM

## 2019-03-28 DIAGNOSIS — R059 Cough, unspecified: Secondary | ICD-10-CM

## 2019-03-28 LAB — URINALYSIS, COMPLETE (UACMP) WITH MICROSCOPIC
Bacteria, UA: NONE SEEN
Bilirubin Urine: NEGATIVE
Glucose, UA: >=500 mg/dL — AB
Hgb urine dipstick: NEGATIVE
Ketones, ur: 80 mg/dL — AB
Leukocytes,Ua: NEGATIVE
Nitrite: NEGATIVE
Protein, ur: NEGATIVE mg/dL
Specific Gravity, Urine: 1.036 — ABNORMAL HIGH (ref 1.005–1.030)
pH: 5 (ref 5.0–8.0)

## 2019-03-28 LAB — COMPREHENSIVE METABOLIC PANEL
ALT: 20 U/L (ref 0–44)
AST: 19 U/L (ref 15–41)
Albumin: 4.2 g/dL (ref 3.5–5.0)
Alkaline Phosphatase: 100 U/L (ref 38–126)
Anion gap: 16 — ABNORMAL HIGH (ref 5–15)
BUN: 9 mg/dL (ref 6–20)
CO2: 22 mmol/L (ref 22–32)
Calcium: 9 mg/dL (ref 8.9–10.3)
Chloride: 97 mmol/L — ABNORMAL LOW (ref 98–111)
Creatinine, Ser: 0.7 mg/dL (ref 0.61–1.24)
GFR calc Af Amer: >60 mL/min (ref >60)
GFR calc non Af Amer: >60 mL/min (ref >60)
Glucose, Bld: 211 mg/dL — ABNORMAL HIGH (ref 70–99)
Potassium: 4 mmol/L (ref 3.5–5.1)
Sodium: 135 mmol/L (ref 135–145)
Total Bilirubin: 1 mg/dL (ref 0.3–1.2)
Total Protein: 7.4 g/dL (ref 6.5–8.1)

## 2019-03-28 LAB — CBC
HCT: 50.3 % (ref 39.0–52.0)
Hemoglobin: 16.9 g/dL (ref 13.0–17.0)
MCH: 33.5 pg (ref 26.0–34.0)
MCHC: 33.6 g/dL (ref 30.0–36.0)
MCV: 99.6 fL (ref 80.0–100.0)
Platelets: 153 10*3/uL (ref 150–400)
RBC: 5.05 MIL/uL (ref 4.22–5.81)
RDW: 13.1 % (ref 11.5–15.5)
WBC: 7 10*3/uL (ref 4.0–10.5)
nRBC: 0 % (ref 0.0–0.2)

## 2019-03-28 LAB — LIPASE, BLOOD: Lipase: 23 U/L (ref 11–51)

## 2019-03-28 MED ORDER — PANTOPRAZOLE SODIUM 40 MG PO TBEC
40.0000 mg | DELAYED_RELEASE_TABLET | Freq: Every day | ORAL | Status: DC
  Administered 2019-03-28: 14:00:00 40 mg via ORAL
  Filled 2019-03-28: qty 1

## 2019-03-28 MED ORDER — ONDANSETRON 4 MG PO TBDP: 4.0000 mg | ORAL_TABLET | Freq: Three times a day (TID) | ORAL | 0 refills | Status: AC | PRN

## 2019-03-28 MED ORDER — OMEPRAZOLE 40 MG PO CPDR: 40.0000 mg | DELAYED_RELEASE_CAPSULE | Freq: Every day | ORAL | 1 refills | Status: DC

## 2019-03-28 MED ORDER — ALBUTEROL SULFATE HFA 108 (90 BASE) MCG/ACT IN AERS: 2.0000 | INHALATION_SPRAY | Freq: Four times a day (QID) | RESPIRATORY_TRACT | 0 refills | Status: DC | PRN

## 2019-03-28 MED ORDER — BENZONATATE 100 MG PO CAPS: ORAL_CAPSULE | ORAL | 0 refills | Status: DC

## 2019-03-28 MED ORDER — ONDANSETRON HCL 4 MG/2ML IJ SOLN
4.0000 mg | Freq: Once | INTRAMUSCULAR | Status: AC
  Administered 2019-03-28: 4 mg via INTRAVENOUS
  Filled 2019-03-28: qty 2

## 2019-03-28 MED ORDER — SODIUM CHLORIDE 0.9% FLUSH: 3.0000 mL | Freq: Once | INTRAVENOUS | Status: DC

## 2019-03-28 NOTE — ED Triage Notes (Addendum)
Pt to ED with c/o abdominal pain that has been ongoing since yesterday. Pt went to [facility] this morning for evaluation and was referred to Emergency Department. Pt has had chronic N/V and Cough which has been taking medication for and needs refill. Pt states cough and nausea has been ongoing for 2-3 months.

## 2019-03-28 NOTE — ED Notes (Signed)
Pt ambulatory to toilet with steady gait noted.  

## 2019-03-28 NOTE — ED Provider Notes (Signed)
[facility] Emergency Department Provider Note ____________________________________________  Time seen: 1223  I have reviewed the triage vital signs and the nursing notes.  HISTORY  Chief Complaint  Abdominal Pain  HPI Shaun Odonnell is a 53 y.o. male presents himself to the ED following initial presentation at [facility] urgent care.  Patient was evaluated there for symptoms related to intermittent cough, nausea, and vomiting.  Patient describes over the last 3 days he has vomited twice describing the vomitus is nonbloody, nonbilious, looking like the food he had digested the night before.  He denies any interim fevers, chills, or sweats.  He has apparently been followed over the last 3 months by med urgent care, for persistent cough, nausea, and vomiting.  Patient is been treated with a course of prednisone, Augmentin, doxycycline, as well as Reglan prescriptions for Hycodan syrup and Zofran.  He noted yesterday after coughing, he had some firmness and tenderness to the mid abdomen around the umbilicus.  He describes some bulging and deformity in that region.  He presented today to the urgent care for evaluation of his intermittent cough, and had planned on requesting refills of his cough syrup and nausea medicine patient describes the cough as intermittent described as a tickle in his throat, noticed primarily when he is lying flat or sitting in his reclining chair.  He denies any significant shortness of breath, hemoptysis, or syncope.  He also denies any cough related vomiting.  Patient admits to discontinuing his previously prescribed omeprazole, described it as "for reflux."  Past Medical History:  Diagnosis Date  . ARDS (adult respiratory distress syndrome) ([place])   . ARDS (adult respiratory distress syndrome) ([place])   . Bipolar disorder ([place])   . Borderline diabetes   . COPD (chronic obstructive pulmonary disease) ([facility])    chronic cough and wheezing  . Depression    . Dizziness    medication related  . Dyspnea    with exertion  . Elevated lipids   . Fatty liver   . Hypercholesterolemia   . Pancreatitis   . Pneumonia    in past  . Pneumonia    in past  . Pre-diabetes    diet controlled  . Pulmonary embolism ([place])   . Schizoaffective disorder [facility])     Patient Active Problem List   Diagnosis Date Noted  . Acute pancreatitis 12/31/2018  . Pancreatitis 07/02/2018    Past Surgical History:  Procedure Laterality Date  . CATARACT EXTRACTION W/PHACO Right 03/26/2018   Procedure: CATARACT EXTRACTION PHACO AND INTRAOCULAR LENS PLACEMENT ([place]) RIGHT BORDERLINE DIABETIC;  Surgeon: Leandrew Koyanagi, MD;  Location: [place];  Service: Ophthalmology;  Laterality: Right;  . CATARACT EXTRACTION W/PHACO Left 04/23/2018   Procedure: CATARACT EXTRACTION PHACO AND INTRAOCULAR LENS PLACEMENT ([place])  LEFT BORDERLINE DIABETIC;  Surgeon: Leandrew Koyanagi, MD;  Location: [place];  Service: Ophthalmology;  Laterality: Left;  . COLONOSCOPY WITH PROPOFOL N/A 08/21/2017   Procedure: COLONOSCOPY WITH PROPOFOL;  Surgeon: Manya Silvas, MD;  Location: [facility] END[facility]COPY;  Service: Endoscopy;  Laterality: N/A;  . HERNIA REPAIR      Prior to Admission medications   Medication Sig Start Date End Date Taking? Authorizing Provider  albuterol (VENTOLIN HFA) 108 (90 Base) MCG/ACT inhaler Inhale 2 puffs into the lungs every 6 (six) hours as needed. 03/28/19   Haroon Shatto, Dannielle Karvonen, PA-C  ALPRAZolam (XANAX) 0.5 MG tablet Take 0.5 mg by mouth 4 (four) times daily. Patient takes when wakes up(~0600)/ 1000/ 1600/  2000 06/26/18   [provider]  benzonatate (TESSALON PERLES) 100 MG capsule Take 1-2 tabs TID prn cough 03/28/19   Briannia Laba, Dannielle Karvonen, PA-C  chlorpheniramine-HYDROcodone (TUSSIONEX PENNKINETIC ER) 10-8 MG/5ML SUER Take 5 mLs by mouth at bedtime as needed. 03/02/19   Norval Gable, MD  empagliflozin (JARDIANCE) 25 MG TABS tablet  Take 25 mg by mouth daily.    [provider]  folic acid (FOLVITE) 1 MG tablet Take 1 tablet (1 mg total) by mouth daily. 01/03/19   Nicholes Mango, MD  ibuprofen (ADVIL,MOTRIN) 800 MG tablet Take 1 tablet (800 mg total) by mouth every 8 (eight) hours as needed (take with food). 01/02/19   Nicholes Mango, MD  Multiple Vitamin (MULTIVITAMIN WITH MINERALS) TABS tablet Take 1 tablet by mouth daily. 01/03/19   Gouru, Illene Silver, MD  nicotine (NICODERM CQ - DOSED IN MG/24 HOURS) 14 mg/24hr patch Place 1 patch (14 mg total) onto the skin daily. Patient not taking: Reported on 12/31/2018 07/10/18   Bettey Costa, MD  OLANZapine (ZYPREXA) 15 MG tablet Take 15 mg by mouth daily at 8 pm. At 2000    [provider]  omeprazole (PRILOSEC) 40 MG capsule Take 1 capsule (40 mg total) by mouth daily. 03/28/19 05/27/19  Aleeyah Bensen, Dannielle Karvonen, PA-C  ondansetron (ZOFRAN ODT) 4 MG disintegrating tablet Take 1 tablet (4 mg total) by mouth every 8 (eight) hours as needed for up to 10 days. 03/28/19 04/07/19  Eliezer Khawaja, Dannielle Karvonen, PA-C  sertraline (ZOLOFT) 100 MG tablet Take 100 mg by mouth daily with breakfast.     [provider]  simvastatin (ZOCOR) 40 MG tablet Take 40 mg by mouth daily. Takes At 1600    [provider]  thiamine 100 MG tablet Take 1 tablet (100 mg total) by mouth daily. 01/03/19   Nicholes Mango, MD    Allergies Fentanyl; Klonopin [clonazepam]; Morphine and related; Pimozide; and Stelazine [trifluoperazine]  Family History  Problem Relation Age of Onset  . Healthy Mother   . Healthy Father     Social History Social History   Tobacco Use  . Smoking status: Current Every Day Smoker    Packs/day: 1.00    Years: 30.00    Pack years: 30.00    Types: Cigarettes  . Smokeless tobacco: Former Systems developer    Types: Snuff  Substance Use Topics  . Alcohol use: Yes    Alcohol/week: 4.0 standard drinks    Types: 4 Cans of beer per week    Comment: history of, none since February   .  Drug use: Not Currently    Review of Systems  Constitutional: Negative for fever. Eyes: Negative for visual changes. ENT: Negative for sore throat. Cardiovascular: Negative for chest pain. Respiratory: Negative for shortness of breath.  Reports intermittent cough. Gastrointestinal: Positive for umbilical abdominal pain, vomiting and diarrhea. Genitourinary: Negative for dysuria. Musculoskeletal: Negative for back pain. Skin: Negative for rash. Neurological: Negative for headaches, focal weakness or numbness. ____________________________________________  PHYSICAL EXAM:  VITAL SIGNS: ED Triage Vitals  Enc Vitals Group     BP 03/28/19 1113 139/86     Pulse Rate 03/28/19 1113 73     Resp 03/28/19 1113 16     Temp 03/28/19 1113 98.3 F (36.8 C)     Temp Source 03/28/19 1113 Oral     SpO2 03/28/19 1113 95 %     Weight 03/28/19 1115 185 lb (83.9 kg)     Height 03/28/19 1115 5\' 8"  (  1.727 m)     Head Circumference --      Peak Flow --      Pain Score 03/28/19 1114 2     Pain Loc --      Pain Edu? --      Excl. in [place]? --     Constitutional: Alert and oriented. Well appearing and in no distress. Head: Normocephalic and atraumatic. Eyes: Conjunctivae are normal. Normal extraocular movements Cardiovascular: Normal rate, regular rhythm. Normal distal pulses. Respiratory: Normal respiratory effort. No wheezes/rales/rhonchi. Gastrointestinal: Soft, protuberant and nontender. Normal bowel sounds x 4. No distention, rebound, guarding, or rigidity. No CVA tenderness elicited. Small umbilical hernia noted, but it is reducible.  Musculoskeletal: Nontender with normal range of motion in all extremities.  Neurologic:  Normal gait without ataxia. Normal speech and language. No gross focal neurologic deficits are appreciated. Skin:  Skin is warm, dry and intact. No rash noted. Psychiatric: Mood and affect are normal. Patient exhibits appropriate insight and  judgment. ____________________________________________   LABS (pertinent positives/negatives) Labs Reviewed  COMPREHENSIVE METABOLIC PANEL - Abnormal; Notable for the following components:      Result Value   Chloride 97 (*)    Glucose, Bld 211 (*)    Anion gap 16 (*)    All other components within normal limits  URINALYSIS, COMPLETE (UACMP) WITH MICROSCOPIC - Abnormal; Notable for the following components:   Color, Urine YELLOW (*)    APPearance CLEAR (*)    Specific Gravity, Urine 1.036 (*)    Glucose, UA >=500 (*)    Ketones, ur 80 (*)    All other components within normal limits  LIPASE, BLOOD  CBC  ____________________________________________   RADIOLOGY CXR  ____________________________________________  PROCEDURES  Procedures Zofran 4 mg IVP ____________________________________________  INITIAL IMPRESSION / ASSESSMENT AND PLAN / ED COURSE  Shaun Odonnell was evaluated in Emergency Department on 03/28/2019 for the symptoms described in the history of present illness. He was evaluated in the context of the global COVID-19 pandemic, which necessitated consideration that the patient might be at risk for infection with the SARS-CoV-2 virus that causes COVID-19. Institutional protocols and algorithms that pertain to the evaluation of patients at risk for COVID-19 are in a state of rapid change based on information released by regulatory bodies including the CDC and federal and state organizations. These policies and algorithms were followed during the patient's care in the ED.  Differential diagnosis includes, but is not limited to, acute appendicitis, renal colic, testicular torsion, urinary tract infection/pyelonephritis, prostatitis,  epididymitis, diverticulitis, small bowel obstruction or ileus, colitis, abdominal aortic aneurysm, gastroenteritis, hernia, etc.  Patient with ED evaluation of intermittent nausea & vomiting as well as a mild cough. His exam, labs, and XR are  normal and reassuring. He has a small, fat-containing umbilical hernia, which is not incarcerated or strangulated. No acute abdominal findings today concerning for pancreatitis or SBO, or AGE. His symptoms are likely related to his currently untreated GERD/gastritis. He will restart his omeprazole and will be discharged with Zofran, Tessalon Perles, and albuterol. I am not inclined to rewrite his hydrocodone syrup as his CXR is negative and his cough is likely due to reflux. He may take OTC dextromethorphan for ongoing ongoing cough relief. He is referred to [facility] and general surgery.  ____________________________________________  FINAL CLINICAL IMPRESSION(S) / ED DIAGNOSES  Final diagnoses:  Periumbilical abdominal pain  Umbilical hernia without obstruction and without gangrene      Tahirih Lair, Dannielle Karvonen, PA-C 03/28/19  1350    Earleen Newport, MD 03/28/19 [phone]

## 2019-03-28 NOTE — Discharge Instructions (Addendum)
Your exam and labs are normal at this time. You have a small fat-containing umbilical hernia. There is no indication of pancreatitis or other acute finding at this time. Take the prescription meds as directed. Take OTC Delsym (dextromethorphan) for additional cough relief. Follow-up with [facility] for additional management of your reflux. You may also opt to consult with General Surgery for evaluation of your hernia.

## 2019-03-28 NOTE — Discharge Instructions (Signed)
Go directly to emergency room as discussed.  °

## 2019-03-28 NOTE — ED Triage Notes (Signed)
Patient c/o ongoing cough and has been taking albuterol inhaler.  Patient reports abdominal pain around his belly button and distension that started yesterday.  Patient reports nausea. Patient reports one episode of vomiting.  Patient reports soft stools.  Patient denies fevers.  Patient also would like refill on Tessalon Perles and albuterol inhaler.

## 2019-03-28 NOTE — ED Notes (Signed)
Pt seen 3 times for cough that keeps coming back fro 2-3 months- pt also having nausea- pt reports n/v/d yesterday- belly button protruding and hardness behind it with tenderness- pt seen at walk-in clinic and was told to come here- pt having tenderness on the left side mostly

## 2019-03-28 NOTE — ED Notes (Signed)
Pt returned from xray at this time.

## 2019-03-28 NOTE — ED Provider Notes (Signed)
[facility] ____________________________________________  Time seen: Approximately 10:41 AM  I have reviewed the triage vital signs and the nursing notes.   HISTORY  Chief Complaint Abdominal Pain; Cough; and Medication Refill  HPI Shaun Odonnell is a 53 y.o. male presenting for evaluation of abdominal pain present for the last 2 days.  Patient reports yesterday he began having vomiting as well as accompanying abdominal pain.  Describes abdominal pain as throughout the mid abdomen and it has felt hard and firm with pain.  Also states his bellybutton was protruding and his abdomen was distended appearing.  Some loose stools.  Vomiting has stopped today.  Denies history of similar in the past.  Also has continued with cough for approximately 1 month, that has continued after taking antibiotics cough medication and inhaler.  Denies fevers.  Has continued to eat and drink.  Some dysuria.  Does not currently have active primary care.    Past Medical History:  Diagnosis Date  . ARDS (adult respiratory distress syndrome) ([place])   . ARDS (adult respiratory distress syndrome) ([place])   . Bipolar disorder ([place])   . Borderline diabetes   . COPD (chronic obstructive pulmonary disease) ([facility])    chronic cough and wheezing  . Depression   . Dizziness    medication related  . Dyspnea    with exertion  . Elevated lipids   . Fatty liver   . Hypercholesterolemia   . Pancreatitis   . Pneumonia    in past  . Pneumonia    in past  . Pre-diabetes    diet controlled  . Pulmonary embolism ([place])   . Schizoaffective disorder [place])     Patient Active Problem List   Diagnosis Date Noted  . Acute pancreatitis 12/31/2018  . Pancreatitis 07/02/2018    Past Surgical History:  Procedure Laterality Date  . CATARACT EXTRACTION W/PHACO Right 03/26/2018   Procedure: CATARACT EXTRACTION PHACO AND INTRAOCULAR LENS PLACEMENT ([place]) RIGHT BORDERLINE DIABETIC;  Surgeon: Leandrew Koyanagi, MD;   Location: [place];  Service: Ophthalmology;  Laterality: Right;  . CATARACT EXTRACTION W/PHACO Left 04/23/2018   Procedure: CATARACT EXTRACTION PHACO AND INTRAOCULAR LENS PLACEMENT ([place])  LEFT BORDERLINE DIABETIC;  Surgeon: Leandrew Koyanagi, MD;  Location: [place];  Service: Ophthalmology;  Laterality: Left;  . COLONOSCOPY WITH PROPOFOL N/A 08/21/2017   Procedure: COLONOSCOPY WITH PROPOFOL;  Surgeon: Manya Silvas, MD;  Location: [facility] ENDOSCOPY;  Service: Endoscopy;  Laterality: N/A;  . HERNIA REPAIR       No current facility-administered medications for this encounter.   Current Outpatient Medications:  .  albuterol (VENTOLIN HFA) 108 (90 Base) MCG/ACT inhaler, Inhale 2 puffs into the lungs every 6 (six) hours as needed for wheezing or shortness of breath., Disp: 1 Inhaler, Rfl: 1 .  ALPRAZolam (XANAX) 0.5 MG tablet, Take 0.5 mg by mouth 4 (four) times daily. Patient takes when wakes up(~0600)/ 1000/ 1600/ 2000, Disp: , Rfl:  .  empagliflozin (JARDIANCE) 25 MG TABS tablet, Take 25 mg by mouth daily., Disp: , Rfl:  .  folic acid (FOLVITE) 1 MG tablet, Take 1 tablet (1 mg total) by mouth daily., Disp: 30 tablet, Rfl: 0 .  Multiple Vitamin (MULTIVITAMIN WITH MINERALS) TABS tablet, Take 1 tablet by mouth daily., Disp: , Rfl:  .  OLANZapine (ZYPREXA) 15 MG tablet, Take 15 mg by mouth daily at 8 pm. At 2000, Disp: , Rfl:  .  sertraline (ZOLOFT) 100 MG tablet, Take 100 mg by mouth daily  with breakfast. , Disp: , Rfl:  .  simvastatin (ZOCOR) 40 MG tablet, Take 40 mg by mouth daily. Takes At 1600, Disp: , Rfl:  .  amoxicillin-clavulanate (AUGMENTIN) 875-125 MG tablet, Take 1 tablet by mouth 2 (two) times daily., Disp: , Rfl:  .  chlorpheniramine-HYDROcodone (TUSSIONEX PENNKINETIC ER) 10-8 MG/5ML SUER, Take 5 mLs by mouth at bedtime as needed., Disp: 60 mL, Rfl: 0 .  ibuprofen (ADVIL,MOTRIN) 800 MG tablet, Take 1 tablet (800 mg total) by mouth every 8 (eight) hours as  needed (take with food)., Disp: 30 tablet, Rfl: 0 .  nicotine (NICODERM CQ - DOSED IN MG/24 HOURS) 14 mg/24hr patch, Place 1 patch (14 mg total) onto the skin daily. (Patient not taking: Reported on 12/31/2018), Disp: 28 patch, Rfl: 0 .  ondansetron (ZOFRAN ODT) 8 MG disintegrating tablet, Take 1 tablet (8 mg total) by mouth every 8 (eight) hours as needed., Disp: 6 tablet, Rfl: 0 .  predniSONE (DELTASONE) 20 MG tablet, 2 tabs po qd x 2 days, then 1 tab po qd x 2 days, then half a tab po qd x 2 days, Disp: 7 tablet, Rfl: 0 .  thiamine 100 MG tablet, Take 1 tablet (100 mg total) by mouth daily., Disp: 30 tablet, Rfl: 0  Allergies Fentanyl; Klonopin [clonazepam]; Morphine and related; Pimozide; and Stelazine [trifluoperazine]  Family History  Problem Relation Age of Onset  . Healthy Mother   . Healthy Father     Social History Social History   Tobacco Use  . Smoking status: Current Every Day Smoker    Packs/day: 1.00    Years: 30.00    Pack years: 30.00    Types: Cigarettes  . Smokeless tobacco: Former Systems developer    Types: Snuff  Substance Use Topics  . Alcohol use: Yes    Alcohol/week: 4.0 standard drinks    Types: 4 Cans of beer per week    Comment: history of, none since February   . Drug use: Not Currently    Review of Systems Constitutional: No fever ENT: No sore throat. Cardiovascular: Denies chest pain. Respiratory: Denies shortness of breath. Gastrointestinal: Positive abdominal pain and vomiting. Genitourinary: Positive for dysuria. Musculoskeletal: Negative for back pain. Skin: Negative for rash.  ____________________________________________   PHYSICAL EXAM:  VITAL SIGNS: ED Triage Vitals  Enc Vitals Group     BP 03/28/19 1023 121/78     Pulse Rate 03/28/19 1023 71     Resp 03/28/19 1023 16     Temp 03/28/19 1023 98.1 F (36.7 C)     Temp Source 03/28/19 1023 Oral     SpO2 03/28/19 1023 97 %     Weight 03/28/19 1020 185 lb (83.9 kg)     Height 03/28/19  1020 5\' 8"  (1.727 m)     Head Circumference --      Peak Flow --      Pain Score 03/28/19 1020 2     Pain Loc --      Pain Edu? --      Excl. in [place]? --     Constitutional: Alert and oriented. Well appearing and in no acute distress. ENT      Head: Normocephalic and atraumatic. Cardiovascular: Normal rate, regular rhythm. Grossly normal heart sounds.  Good peripheral circulation. Respiratory: Normal respiratory effort without tachypnea nor retractions. Breath sounds are clear and equal bilaterally. No wheezes, rales, rhonchi. Gastrointestinal: Normal bowel sounds.  Generalized mid to lower abdominal tenderness with firmness noticed around umbilicus with mild  tenderness.  No CVA tenderness. Musculoskeletal: Steady gait. Neurologic:  Normal speech and language.Speech is normal. No gait instability.  Skin:  Skin is warm, dry  Psychiatric: Mood and affect are normal. Speech and behavior are normal. Patient exhibits appropriate insight and judgment   ___________________________________________   LABS (all labs ordered are listed, but only abnormal results are displayed)  Labs Reviewed - No data to display ____________________________________________   PROCEDURES Procedures   INITIAL IMPRESSION / ASSESSMENT AND PLAN / ED COURSE  Pertinent labs & imaging results that were available during my care of the patient were reviewed by me and considered in my medical decision making (see chart for details).  Overall well-appearing patient.  No acute distress.  Discussed multiple differentials with patient including umbilical hernia versus incarceration, diverticulitis, bowel obstruction or viral illness.  Recommend further evaluation emergency room at this time.  Patient agrees the plan.  Patient states he will go to [place] [facility]. ____________________________________________   FINAL CLINICAL IMPRESSION(S) / ED DIAGNOSES  Final diagnoses:  Periumbilical abdominal pain  Non-intractable  vomiting with nausea, unspecified vomiting type  Cough     ED Discharge Orders    None       Note: This dictation was prepared with Dragon dictation along with smaller phrase technology. Any transcriptional errors that result from this process are unintentional.         Marylene Land, NP 03/28/19 1050

## 2019-03-29 ENCOUNTER — Emergency Department
Admission: EM | Admit: 2019-03-29 | Discharge: 2019-03-29 | Disposition: A | Discharge status: Home / Self Care | Payer: Medicare Other | Attending: Emergency Medicine | Admitting: Emergency Medicine

## 2019-03-29 ENCOUNTER — Other Ambulatory Visit: Payer: Self-pay

## 2019-03-29 DIAGNOSIS — Z5321 Procedure and treatment not carried out due to patient leaving prior to being seen by health care provider: Secondary | ICD-10-CM | POA: Insufficient documentation

## 2019-03-29 DIAGNOSIS — R350 Frequency of micturition: Secondary | ICD-10-CM | POA: Insufficient documentation

## 2019-03-29 NOTE — ED Notes (Signed)
Pt is wondering if he has a UTI. Was seen here yesterday with clean urine.

## 2019-03-29 NOTE — ED Triage Notes (Signed)
Pt c/o painful , frequent urination for the past 3 weeks. Pt was seen and treated for abd pain yesterday and had negative results.

## 2019-03-29 NOTE — ED Notes (Signed)
Pt comes out of room asking if he can get a taxi ride because he just needs to know if he had a UTI. This RN told him that he needs to wait until the doctor sees him.

## 2019-03-29 NOTE — ED Notes (Signed)
Pt wanting to leave AMA. Pt states that it is taking too long for pt to be seen. This RN informed him that pt urine was clean this am. Pt signed AMA form and walked to lobby.

## 2019-04-14 ENCOUNTER — Ambulatory Visit: Payer: Self-pay | Admitting: Physician Assistant

## 2019-04-20 ENCOUNTER — Ambulatory Visit: Payer: Self-pay | Admitting: Surgery

## 2019-04-23 ENCOUNTER — Ambulatory Visit (INDEPENDENT_AMBULATORY_CARE_PROVIDER_SITE_OTHER): Payer: Medicare Other | Admitting: General Surgery

## 2019-04-23 ENCOUNTER — Encounter: Payer: Self-pay | Admitting: General Surgery

## 2019-04-23 ENCOUNTER — Other Ambulatory Visit: Payer: Self-pay

## 2019-04-23 VITALS — BP 120/77 | HR 70 | Temp 98.1°F | Resp 14 | Ht 68.0 in | Wt 174.2 lb

## 2019-04-23 DIAGNOSIS — R1033 Periumbilical pain: Secondary | ICD-10-CM

## 2019-04-23 DIAGNOSIS — Z8719 Personal history of other diseases of the digestive system: Secondary | ICD-10-CM

## 2019-04-23 DIAGNOSIS — K429 Umbilical hernia without obstruction or gangrene: Secondary | ICD-10-CM | POA: Diagnosis not present

## 2019-04-23 DIAGNOSIS — K828 Other specified diseases of gallbladder: Secondary | ICD-10-CM

## 2019-04-23 NOTE — Patient Instructions (Addendum)
You are scheduled for an Ultrasound of the Gallbladder at [facility] on 05/05/19 at 8:00 am. You will arrive there by 7:45 am. You will nothing to eat or drink after midnight the night prior.   You are scheduled for a CT of the Abdomen & Pelvis with contrast at [facility] on 05/11/19 at 8:00 am. You will need to arrive there at 7:45 am. You will need to pick up a prep kit at least two days prior to your scan. You will have nothing to eat or drink for 4 hours prior.    We will call with results and your next step  Umbilical Hernia, Adult  A hernia is a bulge of tissue that pushes through an opening between muscles. An umbilical hernia happens in the abdomen, near the belly button (umbilicus). The hernia may contain tissues from the small intestine, large intestine, or fatty tissue covering the intestines (omentum). Umbilical hernias in adults tend to get worse over time, and they require surgical treatment. There are several types of umbilical hernias. You may have:  A hernia located just above or below the umbilicus (indirect hernia). This is the most common type of umbilical hernia in adults.  A hernia that forms through an opening formed by the umbilicus (direct hernia).  A hernia that comes and goes (reducible hernia). A reducible hernia may be visible only when you strain, lift something heavy, or cough. This type of hernia can be pushed back into the abdomen (reduced).  A hernia that traps abdominal tissue inside the hernia (incarcerated hernia). This type of hernia cannot be reduced.  A hernia that cuts off blood flow to the tissues inside the hernia (strangulated hernia). The tissues can start to die if this happens. This type of hernia requires emergency treatment. What are the causes? An umbilical hernia happens when tissue inside the abdomen presses on a weak area of the abdominal muscles. What increases the risk? You may have a greater risk of  this condition if you:  Are obese.  Have had several pregnancies.  Have a buildup of fluid inside your abdomen (ascites).  Have had surgery that weakens the abdominal muscles. What are the signs or symptoms? The main symptom of this condition is a painless bulge at or near the belly button. A reducible hernia may be visible only when you strain, lift something heavy, or cough. Other symptoms may include:  Dull pain.  A feeling of pressure. Symptoms of a strangulated hernia may include:  Pain that gets increasingly worse.  Nausea and vomiting.  Pain when pressing on the hernia.  Skin over the hernia becoming red or purple.  Constipation.  Blood in the stool. How is this diagnosed? This condition may be diagnosed based on:  A physical exam. You may be asked to cough or strain while standing. These actions increase the pressure inside your abdomen and force the hernia through the opening in your muscles. Your health care provider may try to reduce the hernia by pressing on it.  Your symptoms and medical history. How is this treated? Surgery is the only treatment for an umbilical hernia. Surgery for a strangulated hernia is done as soon as possible. If you have a small hernia that is not incarcerated, you may need to lose weight before having surgery. Follow these instructions at home:  Lose weight, if told by your health care provider.  Do not try to push the hernia back in.  Watch your hernia for any  changes in color or size. Tell your health care provider if any changes occur.  You may need to avoid activities that increase pressure on your hernia.  Do not lift anything that is heavier than 10 lb (4.5 kg) until your health care provider says that this is safe.  Take over-the-counter and prescription medicines only as told by your health care provider.  Keep all follow-up visits as told by your health care provider. This is important. Contact a health care provider  if:  Your hernia gets larger.  Your hernia becomes painful. Get help right away if:  You develop sudden, severe pain near the area of your hernia.  You have pain as well as nausea or vomiting.  You have pain and the skin over your hernia changes color.  You develop a fever. This information is not intended to replace advice given to you by your health care provider. Make sure you discuss any questions you have with your health care provider. Document Released: 03/23/2016 Document Revised: 12/04/2017 Document Reviewed: 04/22/2017 Elsevier Interactive Patient Education  2019 [employer].

## 2019-04-23 NOTE — Progress Notes (Signed)
Patient ID: [name], male   DOB: [birth year], 53 y.o.   MRN: [id number]  Chief Complaint  Patient presents with  . New Patient (Initial Visit)    hernia    HPI Shaun Odonnell is a 53 y.o. male here for evaluation of abdominal hernia. He was at the ER on 03/28/19 for this. He had bulging and hardness around his bellybutton. He had a CT on 12/31/18 for pancreatitis that did show an umbilical hernia. He reports that it is bulged out. He reports the pain is better.  He reports some pain at the end of urination that goes away quickly. He does report flatulence.  Patient has had multiple episodes of pancreatitis dating back to 2015.  Ultrasound at that time did not show cholelithiasis.  Most recent episode was in February of this year.  He continues to make use of alcohol 1 or 2 times per week.  Patient is presently in between primary care providers, scheduled to meet with Carles Collet, PA-C next week.  Patient reports he had been unaware of an umbilical hernia until last month when he had mid abdominal pain and was seen at the walk-in clinic and then the emergency room.  Hernia was reducible and he was discharged home.  Minimal discomfort since that time.  The patient reports that he had difficulty with morphine and fentanyl in the hospital, but did do well with oral Dilaudid.   Past Medical History:  Diagnosis Date  . ARDS (adult respiratory distress syndrome) ([facility])   . ARDS (adult respiratory distress syndrome) ([facility])   . Bipolar disorder ([place])   . Borderline diabetes   . COPD (chronic obstructive pulmonary disease) ([facility])    chronic cough and wheezing  . Depression   . Dizziness    medication related  . Dyspnea    with exertion  . Elevated lipids   . Fatty liver   . Hypercholesterolemia   . Pancreatitis   . Pneumonia    in past  . Pneumonia    in past  . Pre-diabetes    diet controlled  . Pulmonary embolism ([place])   . Schizoaffective disorder [facility])     Past Surgical  History:  Procedure Laterality Date  . CATARACT EXTRACTION W/PHACO Right 03/26/2018   Procedure: CATARACT EXTRACTION PHACO AND INTRAOCULAR LENS PLACEMENT ([place]) RIGHT BORDERLINE DIABETIC;  Surgeon: Leandrew Koyanagi, MD;  Location: [facility];  Service: Ophthalmology;  Laterality: Right;  . CATARACT EXTRACTION W/PHACO Left 04/23/2018   Procedure: CATARACT EXTRACTION PHACO AND INTRAOCULAR LENS PLACEMENT ([place] [facility] [place])  LEFT BORDERLINE DIABETIC;  Surgeon: Leandrew Koyanagi, MD;  Location: [facility];  Service: Ophthalmology;  Laterality: Left;  . COLONOSCOPY WITH PROPOFOL N/A 08/21/2017   Procedure: COLONOSCOPY WITH PROPOFOL;  Surgeon: Manya Silvas, MD;  [facility];  Service: Endoscopy;  Laterality: N/A;  . HERNIA REPAIR      Family History  Problem Relation Age of Onset  . Healthy Mother   . Healthy Father     Social History Social History   Tobacco Use  . Smoking status: Current Every Day Smoker    Packs/day: 1.00    Years: 30.00    Pack years: 30.00    Types: Cigarettes  . Smokeless tobacco: Former Systems developer    Types: Snuff  Substance Use Topics  . Alcohol use: Yes    Alcohol/week: 4.0 standard drinks    Types: 4 Cans of beer per week    Comment: history of, none since February   .  Drug use: Not Currently    Allergies  Allergen Reactions  . Fentanyl Nausea And Vomiting  . Klonopin [Clonazepam]   . Morphine And Related Other (See Comments)    Stopped breathing in the past  . Pimozide   . Stelazine [Trifluoperazine]     Current Outpatient Medications  Medication Sig Dispense Refill  . albuterol (VENTOLIN HFA) 108 (90 Base) MCG/ACT inhaler Inhale 2 puffs into the lungs every 6 (six) hours as needed. 1 Inhaler 0  . ALPRAZolam (XANAX) 0.5 MG tablet Take 0.5 mg by mouth 4 (four) times daily. Patient takes when wakes up(~0600)/ 1000/ 1600/ 2000    . empagliflozin (JARDIANCE) 25 MG TABS tablet Take 25 mg by mouth daily.    Marland Kitchen ibuprofen  (ADVIL,MOTRIN) 800 MG tablet Take 1 tablet (800 mg total) by mouth every 8 (eight) hours as needed (take with food). 30 tablet 0  . OLANZapine (ZYPREXA) 15 MG tablet Take 15 mg by mouth daily at 8 pm. At 2000    . sertraline (ZOLOFT) 100 MG tablet Take 100 mg by mouth daily with breakfast.     . simvastatin (ZOCOR) 40 MG tablet Take 40 mg by mouth daily. Takes At 1600    . thiamine 100 MG tablet Take 1 tablet (100 mg total) by mouth daily. 30 tablet 0   No current facility-administered medications for this visit.     Review of Systems Review of Systems  Constitutional: Negative.   Respiratory: Negative.   Cardiovascular: Negative.     Blood pressure 120/77, pulse 70, temperature 98.1 F (36.7 C), resp. rate 14, height 5\' 8"  (1.727 m), weight 174 lb 3.2 oz (79 kg), SpO2 94 %.  Physical Exam Physical Exam Constitutional:      Appearance: He is well-developed.  Eyes:     General: No scleral icterus.    Conjunctiva/sclera: Conjunctivae normal.  Neck:     Musculoskeletal: Neck supple.  Cardiovascular:     Rate and Rhythm: Normal rate and regular rhythm.     Heart sounds: Normal heart sounds.  Pulmonary:     Effort: Pulmonary effort is normal.     Breath sounds: Normal breath sounds.  Abdominal:     General: Bowel sounds are normal.     Palpations: Abdomen is soft. There is no hepatomegaly or splenomegaly.     Tenderness: There is no abdominal tenderness.     Hernia: A hernia is present. Hernia is present in the umbilical area.    Musculoskeletal: Normal range of motion.  Lymphadenopathy:     Cervical: No cervical adenopathy.  Skin:    General: Skin is warm and dry.  Neurological:     Mental Status: He is alert and oriented to person, place, and time.     Motor: Motor function is intact.     Coordination: Coordination is intact.  Psychiatric:        Mood and Affect: Mood normal.        Behavior: Behavior normal.        Thought Content: Thought content normal.         Judgment: Judgment normal.     Data Reviewed CT of the abdomen and pelvis dated December 31, 2018 was independently reviewed.  This was obtained at the time of admission with his most recent episode of pancreatitis.  Marked peripancreatic fluid noted.  Suggestion of early pseudocyst or pancreatic necrosis having developed moderately, and upon.  Suggestion of gallbladder sludge.  Umbilical hernia without peri-hernia inflammatory changes.  1. Acute pancreatitis with pancreatic and peripancreatic edema as well as acute peripancreatic fluid collections. 2. A hypodense lesion within the tip of the tail the pancreas measuring 2.8 by 2.8 by 1.5 cm is most likely a pseudocyst, or focus of prior pancreatic necrosis. Pancreatic malignancy is considered a less likely differential diagnostic consideration in this setting. 3. Diffuse hepatic steatosis. 4. Sludge versus small gallstones in the gallbladder. Indistinct extrahepatic biliary tree due to the inflammation. 5. Mild secondary inflammation of the duodenum. 6.  Aortic Atherosclerosis (ICD10-I70.0). 7. Borderline prostatomegaly. 8. Small umbilical hernia contains adipose tissues.  Ultrasound of the abdomen dated Mar 13, 2017 was limited to the spleen and was reportedly normal.  Lipase on December 31, 2018 was 1059.  This fell to 175 within 48 hours.  Comprehensive metabolic panel the same date was notable for modest elevation of the serum transaminases.  Bilirubin at 1.3, creatinine 0.74.  Estimated GFR greater than 60.  Elevated potassium at 5.4. CBC at that time showed a white blood cell count of 16,900, marked hemoconcentration with a hemoglobin of 20 (value 9 months early 12.7).  Platelet count of 197,000.  Hemoglobin A1c: 7.5.  Colonoscopy of August 21, 2017: Tubular adenomas of the descending colon, ascending colon.  Hyperplastic polyps in the sigmoid, transverse and rectum.  Procedure completed for a past history of colonic polyps.  Urine  drug screen November 26, 2014: Negative.  Serum lipid profile October 22, 2018: Cholesterol 130, triglyceride 156, HDL 35, LDL 63, VLDL 31.  Hemoglobin A1c at that time: 8.4.   Assessment Complex patient with multiple episodes of pancreatitis.  Normal lipid studies.  Possible developing biliary sludge versus stones.  Likely alcohol source for recurrent pancreatitis.  Plan Prior to considering elective repair of this umbilical hernia (he was reassured with a small tab of fat within the opening incarceration is in highly unlikely, appropriate imaging of the gallbladder with an ultrasound and a follow-up CT of the pancreas is appropriate.    U/S gallbladder CT scan of abdomen & pelvis with contrast  Hernia precautions and incarceration were discussed with the patient. If they develop symptoms of an incarcerated hernia, they were encouraged to seek prompt medical attention. I have recommended repair of the hernia using mesh on an outpatient basis in the near future. The risk of infection was reviewed. The role of prosthetic mesh to minimize the risk of recurrence was reviewed.   HPI, Physical Exam, Assessment and Plan have been scribed under the direction and in the presence of Shaun Bellow, MD  Shaun Living, LPN  Greater than 99% of the time with today's visit was spent on record review and counseling.  I have completed the exam and reviewed the above documentation for accuracy and completeness.  I agree with the above.  Shaun Odonnell has been used and any errors in dictation or transcription are unintentional.  Shaun Odonnell, M.D., F.A.C.S. [place]  04/23/2019, 7:57 PM

## 2019-04-29 ENCOUNTER — Telehealth: Payer: Self-pay | Admitting: Physician Assistant

## 2019-04-29 ENCOUNTER — Encounter: Payer: Self-pay | Admitting: Physician Assistant

## 2019-04-29 ENCOUNTER — Ambulatory Visit (INDEPENDENT_AMBULATORY_CARE_PROVIDER_SITE_OTHER): Payer: Medicare Other | Admitting: Physician Assistant

## 2019-04-29 ENCOUNTER — Other Ambulatory Visit: Payer: Self-pay

## 2019-04-29 ENCOUNTER — Other Ambulatory Visit: Payer: Self-pay | Admitting: Physician Assistant

## 2019-04-29 ENCOUNTER — Telehealth: Payer: Self-pay

## 2019-04-29 VITALS — BP 127/73 | HR 70 | Temp 98.2°F | Resp 16 | Ht 68.0 in | Wt 172.8 lb

## 2019-04-29 DIAGNOSIS — K8521 Alcohol induced acute pancreatitis with uninfected necrosis: Secondary | ICD-10-CM

## 2019-04-29 DIAGNOSIS — E1165 Type 2 diabetes mellitus with hyperglycemia: Secondary | ICD-10-CM

## 2019-04-29 DIAGNOSIS — J449 Chronic obstructive pulmonary disease, unspecified: Secondary | ICD-10-CM | POA: Diagnosis not present

## 2019-04-29 DIAGNOSIS — R110 Nausea: Secondary | ICD-10-CM

## 2019-04-29 DIAGNOSIS — F1010 Alcohol abuse, uncomplicated: Secondary | ICD-10-CM

## 2019-04-29 DIAGNOSIS — F319 Bipolar disorder, unspecified: Secondary | ICD-10-CM | POA: Insufficient documentation

## 2019-04-29 HISTORY — DX: Alcohol abuse, uncomplicated: F10.10

## 2019-04-29 LAB — POCT UA - MICROALBUMIN: Microalbumin Ur, POC: 20 mg/L

## 2019-04-29 LAB — POCT GLYCOSYLATED HEMOGLOBIN (HGB A1C): Hemoglobin A1C: 14 % — AB (ref 4.0–5.6)

## 2019-04-29 MED ORDER — ONDANSETRON HCL 8 MG PO TABS: 8.0000 mg | ORAL_TABLET | Freq: Three times a day (TID) | ORAL | 0 refills | Status: DC | PRN

## 2019-04-29 MED ORDER — ONDANSETRON HCL 4 MG PO TABS: 4.0000 mg | ORAL_TABLET | Freq: Three times a day (TID) | ORAL | 0 refills | Status: DC | PRN

## 2019-04-29 MED ORDER — BASAGLAR KWIKPEN 100 UNIT/ML ~~LOC~~ SOPN: 10.0000 [IU] | PEN_INJECTOR | Freq: Every day | SUBCUTANEOUS | 2 refills | Status: DC

## 2019-04-29 MED ORDER — NOVOFINE 32G X 6 MM MISC: 1 refills | Status: DC

## 2019-04-29 MED ORDER — SIMVASTATIN 40 MG PO TABS: 40.0000 mg | ORAL_TABLET | Freq: Every day | ORAL | 0 refills | Status: DC

## 2019-04-29 MED ORDER — ONDANSETRON 8 MG PO TBDP: 8.0000 mg | ORAL_TABLET | Freq: Three times a day (TID) | ORAL | 0 refills | Status: DC | PRN

## 2019-04-29 MED ORDER — ALBUTEROL SULFATE HFA 108 (90 BASE) MCG/ACT IN AERS: 2.0000 | INHALATION_SPRAY | Freq: Four times a day (QID) | RESPIRATORY_TRACT | 3 refills | Status: DC | PRN

## 2019-04-29 MED ORDER — LANTUS SOLOSTAR 100 UNIT/ML ~~LOC~~ SOPN: 10.0000 [IU] | PEN_INJECTOR | Freq: Every day | SUBCUTANEOUS | 11 refills | Status: DC

## 2019-04-29 NOTE — Telephone Encounter (Signed)
Pharmacist advised. She states she needs a new prescription sent in. I have pulled the order for Lantus. Will you review for accuracy and send to pharmacy. Thanks.

## 2019-04-29 NOTE — Telephone Encounter (Signed)
Sent!

## 2019-04-29 NOTE — Telephone Encounter (Signed)
Patient states the Zofran 8 mg is correct,  but he needs the dissolvable tablets.

## 2019-04-29 NOTE — Telephone Encounter (Signed)
[facility] states patient's insurance denied [employer], [employer]. Insurance will pay for [employer] or [place]. They would like to know if medication can be switched. Please advise.

## 2019-04-29 NOTE — Telephone Encounter (Signed)
Yes lantus 10 units qhs would be fine.

## 2019-04-29 NOTE — Telephone Encounter (Signed)
Patient advised as below.  

## 2019-04-29 NOTE — Patient Instructions (Signed)
Diabetes Mellitus and Exercise Exercising regularly is important for your overall health, especially when you have diabetes (diabetes mellitus). Exercising is not only about losing weight. It has many other health benefits, such as increasing muscle strength and bone density and reducing body fat and stress. This leads to improved fitness, flexibility, and endurance, all of which result in better overall health. Exercise has additional benefits for people with diabetes, including:  Reducing appetite.  Helping to lower and control blood glucose.  Lowering blood pressure.  Helping to control amounts of fatty substances (lipids) in the blood, such as cholesterol and triglycerides.  Helping the body to respond better to insulin (improving insulin sensitivity).  Reducing how much insulin the body needs.  Decreasing the risk for heart disease by: ? Lowering cholesterol and triglyceride levels. ? Increasing the levels of good cholesterol. ? Lowering blood glucose levels. What is my activity plan? Your health care provider or certified diabetes educator can help you make a plan for the type and frequency of exercise (activity plan) that works for you. Make sure that you:  Do at least 150 minutes of moderate-intensity or vigorous-intensity exercise each week. This could be brisk walking, biking, or water aerobics. ? Do stretching and strength exercises, such as yoga or weightlifting, at least 2 times a week. ? Spread out your activity over at least 3 days of the week.  Get some form of physical activity every day. ? Do not go more than 2 days in a row without some kind of physical activity. ? Avoid being inactive for more than 30 minutes at a time. Take frequent breaks to walk or stretch.  Choose a type of exercise or activity that you enjoy, and set realistic goals.  Start slowly, and gradually increase the intensity of your exercise over time. What do I need to know about managing my  diabetes?   Check your blood glucose before and after exercising. ? If your blood glucose is 240 mg/dL (13.3 mmol/L) or higher before you exercise, check your urine for ketones. If you have ketones in your urine, do not exercise until your blood glucose returns to normal. ? If your blood glucose is 100 mg/dL (5.6 mmol/L) or lower, eat a snack containing 15-20 grams of carbohydrate. Check your blood glucose 15 minutes after the snack to make sure that your level is above 100 mg/dL (5.6 mmol/L) before you start your exercise.  Know the symptoms of low blood glucose (hypoglycemia) and how to treat it. Your risk for hypoglycemia increases during and after exercise. Common symptoms of hypoglycemia can include: ? Hunger. ? Anxiety. ? Sweating and feeling clammy. ? Confusion. ? Dizziness or feeling light-headed. ? Increased heart rate or palpitations. ? Blurry vision. ? Tingling or numbness around the mouth, lips, or tongue. ? Tremors or shakes. ? Irritability.  Keep a rapid-acting carbohydrate snack available before, during, and after exercise to help prevent or treat hypoglycemia.  Avoid injecting insulin into areas of the body that are going to be exercised. For example, avoid injecting insulin into: ? The arms, when playing tennis. ? The legs, when jogging.  Keep records of your exercise habits. Doing this can help you and your health care provider adjust your diabetes management plan as needed. Write down: ? Food that you eat before and after you exercise. ? Blood glucose levels before and after you exercise. ? The type and amount of exercise you have done. ? When your insulin is expected to peak, if you use   insulin. Avoid exercising at times when your insulin is peaking.  When you start a new exercise or activity, work with your health care provider to make sure the activity is safe for you, and to adjust your insulin, medicines, or food intake as needed.  Drink plenty of water while  you exercise to prevent dehydration or heat stroke. Drink enough fluid to keep your urine clear or pale yellow. Summary  Exercising regularly is important for your overall health, especially when you have diabetes (diabetes mellitus).  Exercising has many health benefits, such as increasing muscle strength and bone density and reducing body fat and stress.  Your health care provider or certified diabetes educator can help you make a plan for the type and frequency of exercise (activity plan) that works for you.  When you start a new exercise or activity, work with your health care provider to make sure the activity is safe for you, and to adjust your insulin, medicines, or food intake as needed. This information is not intended to replace advice given to you by your health care provider. Make sure you discuss any questions you have with your health care provider. Document Released: 01/12/2004 Document Revised: 05/02/2017 Document Reviewed: 04/02/2016 Elsevier Interactive Patient Education  2019 [employer]  

## 2019-04-29 NOTE — Progress Notes (Signed)
Patient: Shaun Odonnell, Male    DOB: 09/02/1966, 53 y.o.   MRN: [id number] Visit Date: 04/29/2019  Today's Provider: Trinna Post, PA-C   Chief Complaint  Patient presents with  . New Patient (Initial Visit)   Subjective:     Annual physical exam Shaun Odonnell is a 53 y.o. male who presents today for health maintenance and complete physical. He feels well. He reports exercising none. He reports he is sleeping well.  Lives alone in [place], on disability due to bipolar depression. Previously seen at [facility] by Dr. Ouida Sills but was dismissed from entire clinic after a reported disagreement. Patient says he wants to see Dr. Rosanna Randy at this practice and that his mother's friends told him he just had to get in first and then he could see Dr. Rosanna Randy.   Type II DM: Unsure of how long he has had it. Says nobody told him he really had diabetes and his previous doctor "dragged his feet" before giving him medication. He does not have a meter, reports he never had a meter and doesn't check his sugars. He is on jardiance 25 mg daily. He reports he sees Dr. Wallace Going at [facility] but has not had diabetic eye exam because nobody told him he had diabetes. From chart review, he has had diabetes since at least 2015. He is taking a statin. He is not taking an ARB.   Alcohol Abuse: Reports he has been drinking since age 37. At one point was drinking a 12 pack of beer every other day. Reports now he only drinks 2-3 beers twice per week when cooking. Has had multiple occurrences of alcohol induced pancreatitis. He does not see this as a problem.   Pancreatitis: Numerous occasions of pancreatitis and hospital admissions for the same. Was followed by [facility] but has been dismissed from that health system. Reports he has been referred to [facility] but has not scheduled appointment yet. Says he cannot get a straight answer from anyone and previous physician wouldn't call him in pain  medication over the phone so he cancelled his appointment. He reports he got a letter from [facility] that they were unable to contact him but that he never got any calls. Reports intermittent nausea.   Opioid Abuse: Patient does not report this. This is gathered from chart review, particularly 06/17/2014 note from Dr. Frazier Richards at [facility].   Umbilical Hernia: Has recently been seen by [facility] who are obtaining more adbominal imaging.   COPD: Reports he smokes one and a half packer per day and has done so for 32 years. Doesn't desire to quit. Uses albuterol inhaler PRN.   Bipolar Depression: Followed by [facility] in [place], [place]. Sees Dr. Randol Kern. Takes Zyprexa and xanax.  -----------------------------------------------------------------   Review of Systems  Constitutional: Positive for fatigue.  HENT: Positive for congestion and sneezing.   Eyes: Positive for itching.  Respiratory: Positive for cough and wheezing.   Cardiovascular: Positive for palpitations.  Gastrointestinal: Negative.   Endocrine: Negative.   Genitourinary: Positive for urgency.  Musculoskeletal: Negative.   Skin: Negative.   Allergic/Immunologic: Positive for environmental allergies.  Neurological: Negative.   Hematological: Negative.   Psychiatric/Behavioral: The patient is nervous/anxious.     Social History      He  reports that he has been smoking cigarettes. He has a 30.00 pack-year smoking history. He has quit using smokeless tobacco.  His smokeless tobacco use  included snuff. He reports current alcohol use of about 4.0 standard drinks of alcohol per week. He reports previous drug use.       Social History   Socioeconomic History  . Marital status: Single    Spouse name: Not on file  . Number of children: Not on file  . Years of education: Not on file  . Highest education level: Not on file  Occupational History  . Not on file  Social Needs  .  Financial resource strain: Not on file  . Food insecurity    Worry: Not on file    Inability: Not on file  . Transportation needs    Medical: Not on file    Non-medical: Not on file  Tobacco Use  . Smoking status: Current Every Day Smoker    Packs/day: 1.00    Years: 30.00    Pack years: 30.00    Types: Cigarettes  . Smokeless tobacco: Former Systems developer    Types: Snuff  Substance and Sexual Activity  . Alcohol use: Yes    Alcohol/week: 4.0 standard drinks    Types: 4 Cans of beer per week    Comment: history of, none since February   . Drug use: Not Currently  . Sexual activity: Not on file  Lifestyle  . Physical activity    Days per week: Not on file    Minutes per session: Not on file  . Stress: Not on file  Relationships  . Social Herbalist on phone: Not on file    Gets together: Not on file    Attends religious service: Not on file    Active member of club or organization: Not on file    Attends meetings of clubs or organizations: Not on file    Relationship status: Not on file  Other Topics Concern  . Not on file  Social History Narrative  . Not on file    Past Medical History:  Diagnosis Date  . ARDS (adult respiratory distress syndrome) ([place])   . ARDS (adult respiratory distress syndrome) ([place])   . Bipolar disorder ([place])   . Borderline diabetes   . COPD (chronic obstructive pulmonary disease) (HCC)    chronic cough and wheezing  . Depression   . Dizziness    medication related  . Dyspnea    with exertion  . Elevated lipids   . Fatty liver   . Hypercholesterolemia   . Pancreatitis   . Pneumonia    in past  . Pneumonia    in past  . Pre-diabetes    diet controlled  . Pulmonary embolism ([place])   . Schizoaffective disorder [facility])      Patient Active Problem List   Diagnosis Date Noted  . Umbilical hernia without obstruction and without gangrene 04/23/2019  . Acute pancreatitis 12/31/2018  . Pancreatitis 07/02/2018    Past Surgical  History:  Procedure Laterality Date  . CATARACT EXTRACTION W/PHACO Right 03/26/2018   Procedure: CATARACT EXTRACTION PHACO AND INTRAOCULAR LENS PLACEMENT ([place]) RIGHT BORDERLINE DIABETIC;  Surgeon: Leandrew Koyanagi, MD;  Location: [facility];  Service: Ophthalmology;  Laterality: Right;  . CATARACT EXTRACTION W/PHACO Left 04/23/2018   Procedure: CATARACT EXTRACTION PHACO AND INTRAOCULAR LENS PLACEMENT ([place])  LEFT BORDERLINE DIABETIC;  Surgeon: Leandrew Koyanagi, MD;  Location: [facility];  Service: Ophthalmology;  Laterality: Left;  . COLONOSCOPY WITH PROPOFOL N/A 08/21/2017   Procedure: COLONOSCOPY WITH PROPOFOL;  Surgeon: Manya Silvas, MD;  Location: [facility] ENDOSCOPY;  Service: Endoscopy;  Laterality: N/A;  . HERNIA REPAIR      Family History        Family Status  Relation Name Status  . Mother  Alive  . Father  Alive        His family history includes Healthy in his father and mother.      Allergies  Allergen Reactions  . Fentanyl Nausea And Vomiting  . Klonopin [Clonazepam]   . Morphine And Related Other (See Comments)    Stopped breathing in the past  . Pimozide   . Stelazine [Trifluoperazine]      Current Outpatient Medications:  .  albuterol (VENTOLIN HFA) 108 (90 Base) MCG/ACT inhaler, Inhale 2 puffs into the lungs every 6 (six) hours as needed., Disp: 1 Inhaler, Rfl: 0 .  ALPRAZolam (XANAX) 0.5 MG tablet, Take 0.5 mg by mouth 4 (four) times daily. Patient takes when wakes up(~0600)/ 1000/ 1600/ 2000, Disp: , Rfl:  .  empagliflozin (JARDIANCE) 25 MG TABS tablet, Take 25 mg by mouth daily., Disp: , Rfl:  .  OLANZapine (ZYPREXA) 15 MG tablet, Take 15 mg by mouth daily at 8 pm. At 2000, Disp: , Rfl:  .  sertraline (ZOLOFT) 100 MG tablet, Take 100 mg by mouth daily with breakfast. , Disp: , Rfl:  .  simvastatin (ZOCOR) 40 MG tablet, Take 40 mg by mouth daily. Takes At 1600, Disp: , Rfl:  .  omeprazole (PRILOSEC) 40 MG capsule, , Disp: , Rfl:     Patient Care Team: Paulene Floor as PCP - General (Physician Assistant)    Objective:    Vitals: BP 127/73 (BP Location: Left Arm, Patient Position: Sitting, Cuff Size: Large)   Pulse 70   Temp 98.2 F (36.8 C) (Oral)   Resp 16   Ht 5\' 8"  (1.727 m)   Wt 172 lb 12.8 oz (78.4 kg)   SpO2 95%   BMI 26.27 kg/m    Vitals:   04/29/19 0929  BP: 127/73  Pulse: 70  Resp: 16  Temp: 98.2 F (36.8 C)  TempSrc: Oral  SpO2: 95%  Weight: 172 lb 12.8 oz (78.4 kg)  Height: 5\' 8"  (1.727 m)     Physical Exam Constitutional:      Appearance: Normal appearance.  Cardiovascular:     Rate and Rhythm: Normal rate and regular rhythm.     Heart sounds: Normal heart sounds.  Pulmonary:     Effort: Pulmonary effort is normal.     Breath sounds: Normal breath sounds.  Abdominal:     General: Bowel sounds are normal. There is distension.  Skin:    General: Skin is warm and dry.  Neurological:     Mental Status: He is alert and oriented to person, place, and time. Mental status is at baseline.  Psychiatric:        Mood and Affect: Mood is anxious.        Behavior: Behavior normal.      Depression Screen PHQ 2/9 Scores 04/29/2019  PHQ - 2 Score 2  PHQ- 9 Score 3       Assessment & Plan:     Routine Health Maintenance and Physical Exam  Exercise Activities and Dietary recommendations Goals   None     Immunization History  Administered Date(s) Administered  . Pneumococcal Polysaccharide-23 10/24/2012    Health Maintenance  Topic Date Due  . URINE MICROALBUMIN  07/20/1978  . TETANUS/TDAP  07/21/1987  . INFLUENZA VACCINE  06/06/2019  .  COLONOSCOPY  08/22/2027  . HIV Screening  Completed     Discussed health benefits of physical activity, and encouraged him to engage in regular exercise appropriate for his age and condition.    1. Type 2 diabetes mellitus with hyperglycemia, without long-term current use of insulin (HCC)  First, I have been explicit with  patient that Dr. Rosanna Randy is not accepting patients. He should not expect to transfer care to Dr. Rosanna Randy. Second, I have advised patient that I am a provider in my own right and it is not appropriate to use me as a shortcut to other providers in the clinic. At the start of the office visit, I had told him that Dr. Rosanna Randy is not accepting patients and that I am not to be used as a segue to other providers. I asked him if he wanted to continue to be seen and he said yes. Additionally, he was 11 minutes late today and he was counseled on our late policy. This will count as one no-show.  A1c is greater than 14 today. He likely has little to no pancreatic function due to prolonged and likely continuous alcohol abuse and pancreatitis. I have started him on 10 units of basaglar nightly and have done the first injection in office today. He will need labs as below. Not UTD on eye exam. He is not checking his sugars and I have given him a prescription for a meter on a hardscript. I have also referred him to our CCM team for diabetes education.   - POCT UA - Microalbumin - POCT HgB A1C - Ambulatory referral to Ophthalmology - Ambulatory referral to Chronic Care Management Services - Lipid Profile - Comprehensive Metabolic Panel (CMET) - CBC with Differential - simvastatin (ZOCOR) 40 MG tablet; Take 1 tablet (40 mg total) by mouth daily. Takes At 1600  Dispense: 90 tablet; Refill: 0 - HgB A1c - C-peptide - Insulin Glargine (BASAGLAR KWIKPEN) 100 UNIT/ML SOPN; Inject 0.1 mLs (10 Units total) into the skin at bedtime.  Dispense: 3 mL; Refill: 2  2. Nausea  - ondansetron (ZOFRAN) 4 MG tablet; Take 1 tablet (4 mg total) by mouth every 8 (eight) hours as needed for nausea or vomiting.  Dispense: 20 tablet; Refill: 0  3. Chronic obstructive pulmonary disease, unspecified COPD type ([place])  Counseled on smoking cessation.   - albuterol (VENTOLIN HFA) 108 (90 Base) MCG/ACT inhaler; Inhale 2 puffs into the lungs  every 6 (six) hours as needed.  Dispense: 8 g; Refill: 3  4. Alcohol-induced acute pancreatitis with uninfected necrosis  Needs to establish with GI. He has the number. He has been instructed to call.   5. Alcohol abuse  Likely continuous and more than he reports today. Reports that he drinks 2-3 beers twice per week. I find this doubtful and suspect it is substantially more in light of his significantly worse A1c, repeated pancreatitis episodes, and need for CIWA protocol during hospitalizations. He does not think this is an issue. Does not want to stop.   6. Bipolar depression ([place])  Followed at [facility].   The entirety of the information documented in the History of Present Illness, Review of Systems and Physical Exam were personally obtained by me. Portions of this information were initially documented by  and reviewed by me for thoroughness and accuracy.   F/u 2 weeks for DM.   I have spent 60 minutes with this patient, >50% of which was spent on counseling and coordination of care.      --------------------------------------------------------------------  Trinna Post, PA-C  [facility]

## 2019-04-29 NOTE — Telephone Encounter (Signed)
Patient states Zofran 4 mg was sent to pharmacy. He states that RX is wrong. He is requesting a 8 mg tablet ODT be sent to [facility].

## 2019-04-30 ENCOUNTER — Telehealth: Payer: Self-pay

## 2019-04-30 LAB — LIPID PANEL
Chol/HDL Ratio: 5.5 ratio — ABNORMAL HIGH (ref 0.0–5.0)
Cholesterol, Total: 180 mg/dL (ref 100–199)
HDL: 33 mg/dL — ABNORMAL LOW (ref >39)
Triglycerides: 430 mg/dL — ABNORMAL HIGH (ref 0–149)

## 2019-04-30 LAB — CBC WITH DIFFERENTIAL/PLATELET
Basophils Absolute: 0.1 10*3/uL (ref 0.0–0.2)
Basos: 1 %
EOS (ABSOLUTE): 0.1 10*3/uL (ref 0.0–0.4)
Eos: 1 %
Hematocrit: 49.6 % (ref 37.5–51.0)
Hemoglobin: 16.7 g/dL (ref 13.0–17.7)
Immature Grans (Abs): 0.1 10*3/uL (ref 0.0–0.1)
Immature Granulocytes: 1 %
Lymphocytes Absolute: 1.4 10*3/uL (ref 0.7–3.1)
Lymphs: 17 %
MCH: 33.5 pg — ABNORMAL HIGH (ref 26.6–33.0)
MCHC: 33.7 g/dL (ref 31.5–35.7)
MCV: 100 fL — ABNORMAL HIGH (ref 79–97)
Monocytes Absolute: 0.6 10*3/uL (ref 0.1–0.9)
Monocytes: 7 %
Neutrophils Absolute: 5.8 10*3/uL (ref 1.4–7.0)
Neutrophils: 73 %
Platelets: 164 10*3/uL (ref 150–450)
RBC: 4.98 x10E6/uL (ref 4.14–5.80)
RDW: 12.9 % (ref 11.6–15.4)
WBC: 7.9 10*3/uL (ref 3.4–10.8)

## 2019-04-30 LAB — COMPREHENSIVE METABOLIC PANEL
ALT: 14 IU/L (ref 0–44)
AST: 9 IU/L (ref 0–40)
Albumin/Globulin Ratio: 2.2 (ref 1.2–2.2)
Albumin: 4.7 g/dL (ref 4.0–5.0)
Alkaline Phosphatase: 125 IU/L — ABNORMAL HIGH (ref 39–117)
BUN/Creatinine Ratio: 18 (ref 9–20)
BUN: 11 mg/dL (ref 6–24)
Bilirubin Total: 0.3 mg/dL (ref 0.0–1.2)
CO2: 20 mmol/L (ref 20–29)
Calcium: 9.5 mg/dL (ref 8.7–10.2)
Chloride: 97 mmol/L (ref 96–106)
Creatinine, Ser: 0.62 mg/dL — ABNORMAL LOW (ref 0.76–1.27)
GFR calc Af Amer: 134 mL/min/{1.73_m2} (ref >59)
GFR calc non Af Amer: 116 mL/min/{1.73_m2} (ref >59)
Globulin, Total: 2.1 g/dL (ref 1.5–4.5)
Glucose: 259 mg/dL — ABNORMAL HIGH (ref 65–99)
Potassium: 4.2 mmol/L (ref 3.5–5.2)
Sodium: 137 mmol/L (ref 134–144)
Total Protein: 6.8 g/dL (ref 6.0–8.5)

## 2019-04-30 LAB — C-PEPTIDE: C-Peptide: 1.7 ng/mL (ref 1.1–4.4)

## 2019-04-30 LAB — HEMOGLOBIN A1C
Est. average glucose Bld gHb Est-mCnc: 289 mg/dL
Hgb A1c MFr Bld: 11.7 % — ABNORMAL HIGH (ref 4.8–5.6)

## 2019-04-30 NOTE — Telephone Encounter (Signed)
-----   Message from Trinna Post, [place] sent at 04/30/2019 10:35 AM EDT ----- Labs seem stable. His a1c on blood draw is 11.7 which is lower than out machine in office but still will need to continue with the insulin. See him back in 2 weeks. He should bring his glucose meter with him.

## 2019-04-30 NOTE — Telephone Encounter (Signed)
Na

## 2019-04-30 NOTE — Telephone Encounter (Signed)
Pt returned missed call.  Please call pt back. ° °Thanks, °[facility] °

## 2019-04-30 NOTE — Telephone Encounter (Signed)
Patient advised as below.  

## 2019-05-04 ENCOUNTER — Ambulatory Visit (INDEPENDENT_AMBULATORY_CARE_PROVIDER_SITE_OTHER): Payer: Medicare Other | Admitting: *Deleted

## 2019-05-04 ENCOUNTER — Ambulatory Visit: Payer: Self-pay | Admitting: *Deleted

## 2019-05-04 DIAGNOSIS — E1165 Type 2 diabetes mellitus with hyperglycemia: Secondary | ICD-10-CM

## 2019-05-04 DIAGNOSIS — J449 Chronic obstructive pulmonary disease, unspecified: Secondary | ICD-10-CM

## 2019-05-04 NOTE — Chronic Care Management (AMB) (Signed)
Chronic Care Management   Initial Visit Note  05/04/2019 Name: Shaun Odonnell MRN: [id number] DOB: May 06, 1966  Referred by: Trinna Post, PA-C Reason for referral : Chronic Care Management (DM Management)   Shaun Odonnell is a 53 y.o. year old male who is a primary care patient of Trinna Post, [place]. The CCM team was consulted for assistance with chronic disease management and care coordination needs.   Review of patient status, including review of consultants reports, relevant laboratory and other test results, and collaboration with appropriate care team members and the patient's provider was performed as part of comprehensive patient evaluation and provision of chronic care management services.    SDOH (Social Determinants of Health) screening performed today. See Care Plan Entry related to challenges with: Alcohol/Substance Use  Objective:   Goals Addressed    . "I want to know what my blood sugar is supposed to be and learn how to make it better" (pt-stated)       Current Barriers:  . Chronic Disease Management support and education needs related to Type II DM - patient requests assistance today with blood glucose management stating he doesn't know what his blood sugar should be when he is fasting and that he isn't sure about what to eat to better manage his DMII.  o During dietary intake interview, patient relates daily consumption of sugary beverages (in addition to food intake) as follows:  - V8 juice - Apple Juice - Grape Juice - "Home made" mocha frappucinno (at least 3/day) - Sweetened iced tea  Nurse Case Manager Clinical Goal(s):  Marland Kitchen Over the next 30 days, patient will verbalize understanding of plan for long term self health management of DMII . Over the next 30 days, patient will meet with RN Care Manager to address education and care management/care coordination needs related to DMII . Over the next 14 days, patient will attend all scheduled medical  appointments: Carles Collet PA 05/15/19 @ 2:40pm . Over the next 30 days, patient will demonstrate improved health management independence as evidenced byadherence to recommend cbg self monitoring (daily each morning) and recording, improved adherence to recommended carb modified diet, verbalization of understanding of signs and symptoms of hypo and hyperglycemia  Interventions:  . Evaluation of current treatment plan related to DMII and patient's adherence to plan as established by provider. . Advised patient to decrease intake of sugary beverages and offered strategies/solutions including: gradual decrease in sugary beverages, water intake between intake of sugary beverages with goal being transition to water as primary beverage . Provided education to patient re: carb modified diet, cbg monitoring, signs and symptoms of hypoglycemia . Reviewed medications with patient and discussed signs and symptoms of hypoglycemia . Discussed plans with patient for ongoing care management follow up and provided patient with direct contact information for care management team . Reviewed scheduled/upcoming provider appointments including: Carles Collet PA-C 05/15/19 @ 2:40pm . Advised patient, providing education and rationale, to check cbg daily each morning as advised by PCP and record, calling PCP or RNCM for findings outside established parameters or symptoms related to hypoglycemia.   Patient Self Care Activities:  . Self administers medications as prescribed . Attends all scheduled provider appointments . Calls pharmacy for medication refills . Performs ADL's independently . Performs IADL's independently . Calls provider office for new concerns or questions  Initial goal documentation      Plan:   Telephone follow up appointment with care management team member scheduled for: 05/12/19 @ 2pm  The patient will call PCP office or RNCM* as advised to report cbg findings outside established parameters or  signs/symptoms of hypo or hyperglycemia and/or ask questions as needed.  The patient has been provided with contact information for the care management team and has been advised to call with any health related questions or concerns.   [facility] Care Management [phone]

## 2019-05-04 NOTE — Chronic Care Management (AMB) (Signed)
  Chronic Care Management    Clinical Social Work General Note  05/04/2019 Name: GOLDIE DIMMER MRN: [id number] DOB: 1966-02-09  [name] is a 53 y.o. year old male who is a primary care patient of Trinna Post, [place]. The CCM was consulted to assist the patient with diabetes education   Mr. Socarras was given information about Chronic Care Management services today including:  1. CCM service includes personalized support from designated clinical staff supervised by his physician, including individualized plan of care and coordination with other care providers 2. 24/7 contact phone numbers for assistance for urgent and routine care needs. 3. Service will only be billed when office clinical staff spend 20 minutes or more in a month to coordinate care. 4. Only one practitioner may furnish and bill the service in a calendar month. 5. The patient may stop CCM services at any time (effective at the end of the month) by phone call to the office staff. 6. The patient will be responsible for cost sharing (co-pay) of up to 20% of the service fee (after annual deductible is met).  Patient agreed to services and verbal consent obtained.   Review of patient status, including review of consultants reports, relevant laboratory and other test results, and collaboration with appropriate care team members and the patient's provider was performed as part of comprehensive patient evaluation and provision of chronic care management services.     Goals Addressed   None      Follow Up Plan: Appointment scheduled with RNCM to speak with client on the phone: 06/02/19 1pm        [place], [place] Worker  [facility] Care Management [phone]

## 2019-05-04 NOTE — Patient Instructions (Signed)
Thank you allowing the Chronic Care Management Team to be a part of your care! It was a pleasure speaking with you today!   [name] was given information about Chronic Care Management services today including:  1. CCM service includes personalized support from designated clinical staff supervised by his physician, including individualized plan of care and coordination with other care providers 2. 24/7 contact phone numbers for assistance for urgent and routine care needs. 3. Service will only be billed when office clinical staff spend 20 minutes or more in a month to coordinate care. 4. Only one practitioner may furnish and bill the service in a calendar month. 5. The patient may stop CCM services at any time (effective at the end of the month) by phone call to the office staff. 6. The patient will be responsible for cost sharing (co-pay) of up to 20% of the service fee (after annual deductible is met).      CCM (Chronic Care Management) Team   Trish Fountain RN, BSN Nurse Care Coordinator  [phone]  Almyra Free Shaun Odonnell PharmD  Clinical Pharmacist  [phone]   [place], LCSW Clinical Social Worker [phone]  Goals Addressed   None      The patient verbalized understanding of instructions provided today and declined a print copy of patient instruction materials.   Telephone follow up appointment with care management team member scheduled for:06/02/19

## 2019-05-04 NOTE — Patient Instructions (Signed)
Visit Information  Goals Addressed            This Visit's Progress   . "I want to know what my blood sugar is supposed to be and learn how to make it better" (pt-stated)       Current Barriers:  . Chronic Disease Management support and education needs related to Type II DM - patient requests assistance today with blood glucose management stating he doesn't know what his blood sugar should be when he is fasting and that he isn't sure about what to eat to better manage his DMII.  o During dietary intake interview, patient relates daily consumption of sugary beverages (in addition to food intake) as follows:  - V8 juice - Apple Juice - Grape Juice - "Home made" mocha frappucinno (at least 3/day) - Sweetened iced tea  Nurse Case Manager Clinical Goal(s):  Marland Kitchen Over the next 30 days, patient will verbalize understanding of plan for long term self health management of DMII . Over the next 30 days, patient will meet with RN Care Manager to address education and care management/care coordination needs related to DMII . Over the next 14 days, patient will attend all scheduled medical appointments: Shaun Collet PA 05/15/19 @ 2:40pm . Over the next 30 days, patient will demonstrate improved health management independence as evidenced byadherence to recommend cbg self monitoring (daily each morning) and recording, improved adherence to recommended carb modified diet, verbalization of understanding of signs and symptoms of hypo and hyperglycemia  Interventions:  . Evaluation of current treatment plan related to DMII and patient's adherence to plan as established by provider. . Advised patient to decrease intake of sugary beverages and offered strategies/solutions including: gradual decrease in sugary beverages, water intake between intake of sugary beverages with goal being transition to water as primary beverage . Provided education to patient re: carb modified diet, cbg monitoring, signs and symptoms  of hypoglycemia . Reviewed medications with patient and discussed signs and symptoms of hypoglycemia . Discussed plans with patient for ongoing care management follow up and provided patient with direct contact information for care management team . Reviewed scheduled/upcoming provider appointments including: Shaun Collet PA-C 05/15/19 @ 2:40pm . Advised patient, providing education and rationale, to check cbg daily each morning as advised by PCP and record, calling PCP or RNCM for findings outside established parameters or symptoms related to hypoglycemia.   Patient Self Care Activities:  . Self administers medications as prescribed . Attends all scheduled provider appointments . Calls pharmacy for medication refills . Performs ADL's independently . Performs IADL's independently . Calls provider office for new concerns or questions  Initial goal documentation        Shaun Odonnell was given information about Chronic Care Management services today including:  1. CCM service includes personalized support from designated clinical staff supervised by his physician, including individualized plan of care and coordination with other care providers 2. 24/7 contact phone numbers for assistance for urgent and routine care needs. 3. Service will only be billed when office clinical staff spend 20 minutes or more in a month to coordinate care. 4. Only one practitioner may furnish and bill the service in a calendar month. 5. The patient may stop CCM services at any time (effective at the end of the month) by phone call to the office staff. 6. The patient will be responsible for cost sharing (co-pay) of up to 20% of the service fee (after annual deductible is met).  Patient agreed to services and  verbal consent obtained.   The patient verbalized understanding of instructions provided today and declined a print copy of patient instruction materials.   Telephone follow up appointment with care management  team member scheduled for: 05/12/19 @ 2pm  [facility] [phone]

## 2019-05-05 ENCOUNTER — Ambulatory Visit
Admission: RE | Admit: 2019-05-05 | Discharge: 2019-05-05 | Disposition: A | Discharge status: Home / Self Care | Payer: Medicare Other | Source: Ambulatory Visit | Attending: General Surgery | Admitting: General Surgery

## 2019-05-05 ENCOUNTER — Other Ambulatory Visit: Payer: Self-pay

## 2019-05-05 DIAGNOSIS — Z8719 Personal history of other diseases of the digestive system: Secondary | ICD-10-CM | POA: Diagnosis not present

## 2019-05-05 DIAGNOSIS — K828 Other specified diseases of gallbladder: Secondary | ICD-10-CM | POA: Insufficient documentation

## 2019-05-05 LAB — HM DIABETES EYE EXAM

## 2019-05-06 ENCOUNTER — Ambulatory Visit (INDEPENDENT_AMBULATORY_CARE_PROVIDER_SITE_OTHER): Payer: Medicare Other | Admitting: *Deleted

## 2019-05-06 DIAGNOSIS — E1165 Type 2 diabetes mellitus with hyperglycemia: Secondary | ICD-10-CM | POA: Diagnosis not present

## 2019-05-06 NOTE — Patient Instructions (Signed)
Visit Information  Goals Addressed            This Visit's Progress   . "I want to know what my blood sugar is supposed to be and learn how to make it better" (pt-stated)       Current Barriers:  . Chronic Disease Management support and education needs related to Type II DM - patient calls today tfor assistance with DM management o Reports fasting cbg = 240 this morning after having bologna/cheese sandwich for supper last night at 5:30pm and "Pure protein" bar for 10pm snack.  o Reports "seeing a halo around my feet when I was propped up watching TV" after supper/before evening snack. Took 4oz apple juice and visual disturbance resolved. No other associated symptom reported. Asks "should I take another basaglar shot if my sugar is low"  Nurse Case Manager Clinical Goal(s):  Marland Kitchen Over the next 30 days, patient will verbalize understanding of plan for long term self health management of DMII . Over the next 30 days, patient will meet with RN Care Manager to address education and care management/care coordination needs related to DMII . Over the next 14 days, patient will attend all scheduled medical appointments: Carles Collet PA 05/15/19 @ 2:40pm . Over the next 30 days, patient will demonstrate improved health management independence as evidenced byadherence to recommend cbg self monitoring (daily each morning) and recording, improved adherence to recommended carb modified diet, verbalization of understanding of signs and symptoms of hypo and hyperglycemia  Interventions:  . Reviewed diabetes medications and advised to NEVER adjust medications without direct supervision from provider . Advised patient to continue monitoring and recording daily fasting (morning) cbg as advised by PCP . Advised patient to continue food diary and cbg log . Advised on carb modified diet including reduction of simple and processed carbohydrates, addition of non-starchy vegetables, reduction of added fats (likes to  add "Velveeta Shells and Cheese" to several other dishes), decrease intake of sugary beverages . Reviewed signs and symptoms of hypoglycemia and hyperglycemia and when to call provider  Patient Self Care Activities:  . Self administers medications as prescribed . Attends all scheduled provider appointments . Monitors and records CBG . Keeps food diary . Calls pharmacy for medication refills . Performs ADL's independently . Performs IADL's independently . Calls provider office for new concerns or questions  Please see past updates related to this goal by clicking on the "Past Updates" button in the selected goal         The patient verbalized understanding of instructions provided today and declined a print copy of patient instruction materials.   Telephone follow up appointment with care management team member scheduled for: 05/12/19 2pm Next PCP appointment scheduled for: 05/12/19 2:40pm  [facility] [phone]

## 2019-05-06 NOTE — Chronic Care Management (AMB) (Signed)
Chronic Care Management   Follow Up Note   05/06/2019 Name: Shaun Odonnell MRN: [id number] DOB: August 31, 1966  Referred by: Trinna Post, PA-C Reason for referral : Chronic Care Management (DMII)   Shaun Odonnell is a 53 y.o. year old male who is a primary care patient of Trinna Post, [place]. The CCM team was consulted for assistance with chronic disease management and care coordination needs.    Review of patient status, including review of consultants reports, relevant laboratory and other test results, and collaboration with appropriate care team members and the patient's provider was performed as part of comprehensive patient evaluation and provision of chronic care management services.     Goals Addressed            This Visit's Progress   . "I want to know what my blood sugar is supposed to be and learn how to make it better" (pt-stated)       Current Barriers:  . Chronic Disease Management support and education needs related to Type II DM - patient calls today tfor assistance with DM management o Reports fasting cbg = 240 this morning after having bologna/cheese sandwich for supper last night at 5:30pm and "Pure protein" bar for 10pm snack.  o Reports "seeing a halo around my feet when I was propped up watching TV" after supper/before evening snack. Took 4oz apple juice and visual disturbance resolved. No other associated symptom reported. Asks "should I take another basaglar shot if my sugar is low"  Nurse Case Manager Clinical Goal(s):  Marland Kitchen Over the next 30 days, patient will verbalize understanding of plan for long term self health management of DMII . Over the next 30 days, patient will meet with RN Care Manager to address education and care management/care coordination needs related to DMII . Over the next 14 days, patient will attend all scheduled medical appointments: Carles Collet PA 05/15/19 @ 2:40pm . Over the next 30 days, patient will demonstrate improved  health management independence as evidenced byadherence to recommend cbg self monitoring (daily each morning) and recording, improved adherence to recommended carb modified diet, verbalization of understanding of signs and symptoms of hypo and hyperglycemia  Interventions:  . Reviewed diabetes medications and advised to NEVER adjust medications without direct supervision from provider . Advised patient to continue monitoring and recording daily fasting (morning) cbg as advised by PCP . Advised patient to continue food diary and cbg log . Advised on carb modified diet including reduction of simple and processed carbohydrates, addition of non-starchy vegetables, reduction of added fats (likes to add "Velveeta Shells and Cheese" to several other dishes), decrease intake of sugary beverages . Reviewed signs and symptoms of hypoglycemia and hyperglycemia and when to call provider  Patient Self Care Activities:  . Self administers medications as prescribed . Attends all scheduled provider appointments . Monitors and records CBG . Keeps food diary . Calls pharmacy for medication refills . Performs ADL's independently . Performs IADL's independently . Calls provider office for new concerns or questions  Please see past updates related to this goal by clicking on the "Past Updates" button in the selected goal          Plan: Telephone follow up appointment with care management team member scheduled for: 05/12/19 2pm Follow up with provider re: signs/symptoms of hypoglycemia or CBGs < 100 which do not respond to treatment, CBG > 250 x 2 in a row Next PCP appointment scheduled for: 05/12/19 2:40pm   Janalyn Shy  [facility] [phone]

## 2019-05-07 ENCOUNTER — Encounter: Payer: Self-pay | Admitting: Emergency Medicine

## 2019-05-07 ENCOUNTER — Emergency Department: Payer: Medicare Other

## 2019-05-07 ENCOUNTER — Telehealth: Payer: Self-pay | Admitting: *Deleted

## 2019-05-07 ENCOUNTER — Other Ambulatory Visit: Payer: Self-pay

## 2019-05-07 ENCOUNTER — Emergency Department
Admission: EM | Admit: 2019-05-07 | Discharge: 2019-05-07 | Disposition: A | Discharge status: Home / Self Care | Payer: Medicare Other | Attending: Emergency Medicine | Admitting: Emergency Medicine

## 2019-05-07 DIAGNOSIS — Z20828 Contact with and (suspected) exposure to other viral communicable diseases: Secondary | ICD-10-CM | POA: Diagnosis not present

## 2019-05-07 DIAGNOSIS — R079 Chest pain, unspecified: Secondary | ICD-10-CM | POA: Diagnosis present

## 2019-05-07 DIAGNOSIS — R05 Cough: Secondary | ICD-10-CM | POA: Diagnosis not present

## 2019-05-07 DIAGNOSIS — E119 Type 2 diabetes mellitus without complications: Secondary | ICD-10-CM | POA: Insufficient documentation

## 2019-05-07 DIAGNOSIS — F1010 Alcohol abuse, uncomplicated: Secondary | ICD-10-CM | POA: Insufficient documentation

## 2019-05-07 DIAGNOSIS — Z794 Long term (current) use of insulin: Secondary | ICD-10-CM | POA: Insufficient documentation

## 2019-05-07 DIAGNOSIS — Z79899 Other long term (current) drug therapy: Secondary | ICD-10-CM | POA: Diagnosis not present

## 2019-05-07 DIAGNOSIS — F17210 Nicotine dependence, cigarettes, uncomplicated: Secondary | ICD-10-CM | POA: Diagnosis not present

## 2019-05-07 DIAGNOSIS — R059 Cough, unspecified: Secondary | ICD-10-CM

## 2019-05-07 LAB — CBC WITH DIFFERENTIAL/PLATELET
Abs Immature Granulocytes: 0.14 10*3/uL — ABNORMAL HIGH (ref 0.00–0.07)
Basophils Absolute: 0.1 10*3/uL (ref 0.0–0.1)
Basophils Relative: 1 %
Eosinophils Absolute: 0.1 10*3/uL (ref 0.0–0.5)
Eosinophils Relative: 1 %
HCT: 48.8 % (ref 39.0–52.0)
Hemoglobin: 16.7 g/dL (ref 13.0–17.0)
Immature Granulocytes: 1 %
Lymphocytes Relative: 11 %
Lymphs Abs: 1.1 10*3/uL (ref 0.7–4.0)
MCH: 33.3 pg (ref 26.0–34.0)
MCHC: 34.2 g/dL (ref 30.0–36.0)
MCV: 97.4 fL (ref 80.0–100.0)
Monocytes Absolute: 0.5 10*3/uL (ref 0.1–1.0)
Monocytes Relative: 5 %
Neutro Abs: 8 10*3/uL — ABNORMAL HIGH (ref 1.7–7.7)
Neutrophils Relative %: 81 %
Platelets: 172 10*3/uL (ref 150–400)
RBC: 5.01 MIL/uL (ref 4.22–5.81)
RDW: 13 % (ref 11.5–15.5)
WBC: 9.9 10*3/uL (ref 4.0–10.5)
nRBC: 0 % (ref 0.0–0.2)

## 2019-05-07 LAB — COMPREHENSIVE METABOLIC PANEL
ALT: 18 U/L (ref 0–44)
AST: 17 U/L (ref 15–41)
Albumin: 4.5 g/dL (ref 3.5–5.0)
Alkaline Phosphatase: 103 U/L (ref 38–126)
Anion gap: 14 (ref 5–15)
BUN: 11 mg/dL (ref 6–20)
CO2: 22 mmol/L (ref 22–32)
Calcium: 9.3 mg/dL (ref 8.9–10.3)
Chloride: 100 mmol/L (ref 98–111)
Creatinine, Ser: 0.5 mg/dL — ABNORMAL LOW (ref 0.61–1.24)
GFR calc Af Amer: >60 mL/min (ref >60)
GFR calc non Af Amer: >60 mL/min (ref >60)
Glucose, Bld: 263 mg/dL — ABNORMAL HIGH (ref 70–99)
Potassium: 4 mmol/L (ref 3.5–5.1)
Sodium: 136 mmol/L (ref 135–145)
Total Bilirubin: 0.8 mg/dL (ref 0.3–1.2)
Total Protein: 7.7 g/dL (ref 6.5–8.1)

## 2019-05-07 LAB — URINE DRUG SCREEN, QUALITATIVE (ARMC ONLY)
Amphetamines, Ur Screen: NOT DETECTED
Barbiturates, Ur Screen: NOT DETECTED
Benzodiazepine, Ur Scrn: POSITIVE — AB
Cannabinoid 50 Ng, Ur ~~LOC~~: NOT DETECTED
Cocaine Metabolite,Ur ~~LOC~~: NOT DETECTED
MDMA (Ecstasy)Ur Screen: NOT DETECTED
Methadone Scn, Ur: NOT DETECTED
Opiate, Ur Screen: NOT DETECTED
Phencyclidine (PCP) Ur S: NOT DETECTED
Tricyclic, Ur Screen: NOT DETECTED

## 2019-05-07 LAB — URINALYSIS, COMPLETE (UACMP) WITH MICROSCOPIC
Bacteria, UA: NONE SEEN
Bilirubin Urine: NEGATIVE
Glucose, UA: >=500 mg/dL — AB
Hgb urine dipstick: NEGATIVE
Ketones, ur: 20 mg/dL — AB
Leukocytes,Ua: NEGATIVE
Nitrite: NEGATIVE
Protein, ur: NEGATIVE mg/dL
Specific Gravity, Urine: 1.036 — ABNORMAL HIGH (ref 1.005–1.030)
Squamous Epithelial / HPF: NONE SEEN (ref 0–5)
pH: 5 (ref 5.0–8.0)

## 2019-05-07 LAB — LIPASE, BLOOD: Lipase: 25 U/L (ref 11–51)

## 2019-05-07 LAB — SARS CORONAVIRUS 2 BY RT PCR (HOSPITAL ORDER, PERFORMED IN ~~LOC~~ HOSPITAL LAB): SARS Coronavirus 2: NEGATIVE

## 2019-05-07 LAB — TROPONIN I (HIGH SENSITIVITY): Troponin I (High Sensitivity): 3 ng/L (ref <18)

## 2019-05-07 LAB — ETHANOL: Alcohol, Ethyl (B): <10 mg/dL (ref <10)

## 2019-05-07 LAB — GLUCOSE, CAPILLARY: Glucose-Capillary: 149 mg/dL — ABNORMAL HIGH (ref 70–99)

## 2019-05-07 MED ORDER — ONDANSETRON HCL 4 MG/2ML IJ SOLN
4.0000 mg | Freq: Once | INTRAMUSCULAR | Status: AC
  Administered 2019-05-07: 4 mg via INTRAVENOUS
  Filled 2019-05-07: qty 2

## 2019-05-07 MED ORDER — ALPRAZOLAM 0.5 MG PO TABS
0.5000 mg | ORAL_TABLET | Freq: Once | ORAL | Status: AC
  Administered 2019-05-07: 14:00:00 0.5 mg via ORAL
  Filled 2019-05-07: qty 1

## 2019-05-07 MED ORDER — SODIUM CHLORIDE 0.9 % IV BOLUS
1000.0000 mL | Freq: Once | INTRAVENOUS | Status: AC
  Administered 2019-05-07: 1000 mL via INTRAVENOUS

## 2019-05-07 NOTE — ED Provider Notes (Addendum)
.[facility] Emergency Department Provider Note  ____________________________________________   I have reviewed the triage vital signs and the nursing notes. Where available I have reviewed prior notes and, if possible and indicated, outside hospital notes.    HISTORY  Chief Complaint Chest Pain   Patient seen and evaluated during the coronavirus epidemic during a time with low staffing  HPI Shaun Odonnell is a 53 y.o. male who has a history of EtOH abuse tobacco abuse, alcohol induced pancreatitis, diabetes, who states he "really did not" last night, drinking more than 10 beers, he is not sure if remember to take his diabetes medication last night.  He states that he woke up and had one episode of nonbloody nonbilious vomiting, one episode of diarrhea, and had a warm feeling in his chest after throwing up.  Denies any ongoing symptoms.  This all happened this morning around 730.  No fever no chills.  No exertional symptoms, no leg swelling no personal family history of PE or DVT. States that the warm feeling happened after he vomited after he drank.  He does incidentally noticed that he has a chronic smoker's cough and is been coughing a lot recently no fever or other signs or symptoms of the coronavirus.  He smokes a pack and a quarter of cigarettes a day   Past Medical History:  Diagnosis Date  . Alcohol abuse 04/29/2019  . Allergy   . Anxiety   . ARDS (adult respiratory distress syndrome) ([place])   . ARDS (adult respiratory distress syndrome) ([place])   . Bipolar disorder ([place])   . Borderline diabetes   . Cataract   . Chronic kidney disease   . COPD (chronic obstructive pulmonary disease) ([facility])    chronic cough and wheezing  . Depression   . Diabetes mellitus without complication ([place])   . Dizziness    medication related  . Dyspnea    with exertion  . Elevated lipids   . Fatty liver   . Hypercholesterolemia   . Pancreatitis   . Pneumonia     in past  . Pneumonia    in past  . Pre-diabetes    diet controlled  . Pulmonary embolism ([place])   . Schizoaffective disorder [facility])     Patient Active Problem List   Diagnosis Date Noted  . Type 2 diabetes mellitus with hyperglycemia, without long-term current use of insulin ([place]) 04/29/2019  . Chronic obstructive pulmonary disease ([place]) 04/29/2019  . Alcohol abuse 04/29/2019  . Bipolar depression ([place]) 04/29/2019  . Umbilical hernia without obstruction and without gangrene 04/23/2019  . Acute pancreatitis 12/31/2018  . Pancreatitis 07/02/2018    Past Surgical History:  Procedure Laterality Date  . CATARACT EXTRACTION W/PHACO Right 03/26/2018   Procedure: CATARACT EXTRACTION PHACO AND INTRAOCULAR LENS PLACEMENT ([place]) RIGHT BORDERLINE DIABETIC;  Surgeon: Leandrew Koyanagi, MD;  Location: [facility];  Service: Ophthalmology;  Laterality: Right;  . CATARACT EXTRACTION W/PHACO Left 04/23/2018   Procedure: CATARACT EXTRACTION PHACO AND INTRAOCULAR LENS PLACEMENT ([place])  LEFT BORDERLINE DIABETIC;  Surgeon: Leandrew Koyanagi, MD;  Location: [facility];  Service: Ophthalmology;  Laterality: Left;  . COLONOSCOPY WITH PROPOFOL N/A 08/21/2017   Procedure: COLONOSCOPY WITH PROPOFOL;  Surgeon: Manya Silvas, MD;  Location: [facility] ENDOSCOPY;  Service: Endoscopy;  Laterality: N/A;  . EYE SURGERY    . HERNIA REPAIR      Prior to Admission medications   Medication Sig Start Date End Date Taking? Authorizing Provider  albuterol (  VENTOLIN HFA) 108 (90 Base) MCG/ACT inhaler Inhale 2 puffs into the lungs every 6 (six) hours as needed. 04/29/19 07/28/19  Trinna Post, PA-C  ALPRAZolam Duanne Moron) 0.5 MG tablet Take 0.5 mg by mouth 4 (four) times daily. Patient takes when wakes up(~0600)/ 1000/ 1600/ 2000 06/26/18   [provider]  empagliflozin (JARDIANCE) 25 MG TABS tablet Take 25 mg by mouth daily.    [provider]  Insulin Glargine (LANTUS SOLOSTAR) 100  UNIT/ML Solostar Pen Inject 10 Units into the skin at bedtime. 04/29/19   Trinna Post, PA-C  Insulin Pen Needle (NOVOFINE) 32G X 6 MM MISC Use 1 needle to inject 10 units basal insulin once nightly. 04/29/19   Trinna Post, PA-C  OLANZapine (ZYPREXA) 15 MG tablet Take 15 mg by mouth daily at 8 pm. At 2000    [provider]  omeprazole (PRILOSEC) 40 MG capsule  04/25/19   [provider]  ondansetron (ZOFRAN) 8 MG tablet Take 1 tablet (8 mg total) by mouth every 8 (eight) hours as needed for nausea or vomiting. 04/29/19   Trinna Post, PA-C  ondansetron (ZOFRAN-ODT) 8 MG disintegrating tablet Take 1 tablet (8 mg total) by mouth every 8 (eight) hours as needed for nausea or vomiting. 04/29/19   Trinna Post, PA-C  sertraline (ZOLOFT) 100 MG tablet Take 100 mg by mouth daily with breakfast.     [provider]  simvastatin (ZOCOR) 40 MG tablet Take 1 tablet (40 mg total) by mouth daily. Takes At 1600 04/29/19 07/28/19  Trinna Post, PA-C    Allergies Morphine and related, Klonopin [clonazepam], Pimozide, Stelazine [trifluoperazine], and Fentanyl  Family History  Problem Relation Age of Onset  . Hyperlipidemia Mother   . Hypertension Father   . Dementia Maternal Grandmother   . Heart disease Maternal Grandfather   . Heart disease Paternal Grandmother   . Stroke Paternal Grandfather     Social History Social History   Tobacco Use  . Smoking status: Current Every Day Smoker    Packs/day: 1.00    Years: 30.00    Pack years: 30.00    Types: Cigarettes  . Smokeless tobacco: Former Systems developer    Types: Snuff  Substance Use Topics  . Alcohol use: Yes  . Drug use: Not Currently    Review of Systems Constitutional: No fever/chills Eyes: No visual changes. ENT: No sore throat. No stiff neck no neck pain Cardiovascular: The HPI regarding chest pain Respiratory: Denies shortness of breath. Gastrointestinal:   See HPI Genitourinary: Negative for  dysuria. Musculoskeletal: Negative lower extremity swelling Skin: Negative for rash. Neurological: Negative for severe headaches, focal weakness or numbness.   ____________________________________________   PHYSICAL EXAM:  VITAL SIGNS: ED Triage Vitals  Enc Vitals Group     BP 05/07/19 1054 (!) 152/86     Pulse Rate 05/07/19 1054 83     Resp 05/07/19 1054 16     Temp 05/07/19 1054 98.8 F (37.1 C)     Temp Source 05/07/19 1054 Oral     SpO2 05/07/19 1054 92 %     Weight 05/07/19 1055 174 lb (78.9 kg)     Height 05/07/19 1055 5\' 8"  (1.727 m)     Head Circumference --      Peak Flow --      Pain Score 05/07/19 1055 0     Pain Loc --      Pain Edu? --      Excl. in  GC? --     Constitutional: Alert and oriented. Well appearing and in no acute distress. Eyes: Conjunctivae are normal Head: Atraumatic HEENT: No congestion/rhinnorhea. Mucous membranes are moist.  Oropharynx non-erythematous Neck:   Nontender with no meningismus, no masses, no stridor Cardiovascular: Normal rate, regular rhythm. Grossly normal heart sounds.  Good peripheral circulation. Respiratory: Normal respiratory effort.  No retractions. Lungs CTAB. Abdominal: Soft and nontender. No distention. No guarding no rebound Back:  There is no focal tenderness or step off.  there is no midline tenderness there are no lesions noted. there is no CVA tenderness Musculoskeletal: No lower extremity tenderness, no upper extremity tenderness. No joint effusions, no DVT signs strong distal pulses no edema Neurologic:  Normal speech and language. No gross focal neurologic deficits are appreciated.  Skin:  Skin is warm, dry and intact. No rash noted. Psychiatric: Mood and affect are normal. Speech and behavior are normal.  ____________________________________________   LABS (all labs ordered are listed, but only abnormal results are displayed)  Labs Reviewed  SARS CORONAVIRUS 2 (HOSPITAL ORDER, [place] [facility])  CBC WITH DIFFERENTIAL/PLATELET  COMPREHENSIVE METABOLIC PANEL  ETHANOL  LIPASE, BLOOD  TROPONIN I (HIGH SENSITIVITY)  TROPONIN I (HIGH SENSITIVITY)  URINALYSIS, COMPLETE (UACMP) WITH MICROSCOPIC  URINE DRUG SCREEN, QUALITATIVE ([facility] ONLY)    Pertinent labs  results that were available during my care of the patient were reviewed by me and considered in my medical decision making (see chart for details). ____________________________________________  EKG  I personally interpreted any EKGs ordered by me or triage Sinus rhythm RSR prime noted no acute ST elevation or depression normal axis. ____________________________________________  RADIOLOGY  Pertinent labs & imaging results that were available during my care of the patient were reviewed by me and considered in my medical decision making (see chart for details). If possible, patient and/or family made aware of any abnormal findings.  No results found. ____________________________________________    PROCEDURES  Procedure(s) performed: None  Procedures  Critical Care performed: None  ____________________________________________   INITIAL IMPRESSION / ASSESSMENT AND PLAN / ED COURSE  Pertinent labs & imaging results that were available during my care of the patient were reviewed by me and considered in my medical decision making (see chart for details).  Patient here with a warm feeling in his chest after vomiting after drinking a bunch of beer last night.  Also states he is got something of a cough chronically for tobacco abuse.  Well-appearing.  Low suspicion for ACS but will send troponin we will do chest x-ray check lipase basic blood work, I will check coronavirus on him for his cough and reassess anticipate his sugar will be elevated because it does not appear that he did take his insulin last night while drinking.  The status his best guess.  We will give him IV  fluid.  ----------------------------------------- 1:13 PM on 05/07/2019 -----------------------------------------  At this time, there does not appear to be clinical evidence to support the diagnosis of pulmonary embolus, dissection, myocarditis, endocarditis, pericarditis, pericardial tamponade, acute coronary syndrome, pneumothorax, pneumonia, or any other acute intrathoracic pathology that will require admission or acute intervention. Nor is there evidence of any significant intra-abdominal pathology causing this discomfort.  Considering the patient's symptoms, medical history, and physical examination today, I have low suspicion for cholecystitis or biliary pathology, pancreatitis, perforation or bowel obstruction, hernia, intra-abdominal abscess, AAA or dissection, volvulus or intussusception, mesenteric ischemia, ischemic gut, pyelonephritis or appendicitis.  At her IV fluids  and nausea medication patient feels less hung over, does not have any warm feelings anywhere, and would like to go home.  Work-up is quite reassuring, blood work is reassuring vital signs are reassuring, cardiac enzymes, high-sensitivity, are negative and I do not think serial enzymes are indicated, he is not actually having chest pain, we will discharge the patient with close outpatient follow-up and return precautions given and understood at his request.    ____________________________________________   FINAL CLINICAL IMPRESSION(S) / ED DIAGNOSES  Final diagnoses:  Cough      This chart was dictated using voice recognition software.  Despite best efforts to proofread,  errors can occur which can change meaning.      Schuyler Amor, MD 05/07/19 1116    Schuyler Amor, MD 05/07/19 1315

## 2019-05-07 NOTE — ED Triage Notes (Signed)
Pt in via [facility] from home with multiple complaints, reports chest pain with N/V beginning this morning.  Also reports ongoing cough x one month.  Pt does report drinking heavily last night.  Pt clammy upon arrival.  Vitals WDL.  NAD noted at this time.

## 2019-05-07 NOTE — ED Notes (Signed)
Pt aware urine sample needed, urinal provided

## 2019-05-07 NOTE — ED Notes (Signed)
Pt speaking with this RN in NAD, A&Ox4.

## 2019-05-07 NOTE — Discharge Instructions (Addendum)
Return to the emergency room for any new or worsening symptoms including vomiting chest pain shortness of breath, follow close with primary care doctor.

## 2019-05-07 NOTE — ED Notes (Signed)
Pt c/o lightheadedness and nausea, Dr. Burlene Arnt informed, zofran and fluids ordered

## 2019-05-07 NOTE — Telephone Encounter (Signed)
Patient called and wanted to know his results from his ultrasound done on 05/05/19

## 2019-05-07 NOTE — Telephone Encounter (Signed)
Patient notified that his ultrasound was normal. He states that he had to be rushed to the ER today for tightness around the chest. Was wondering if this could be related to the gallbladder or not. He wants to wait until after his CT on Monday to see about scheduling surgery in light of what happened today.

## 2019-05-11 ENCOUNTER — Other Ambulatory Visit: Payer: Self-pay

## 2019-05-11 ENCOUNTER — Telehealth: Payer: Self-pay

## 2019-05-11 ENCOUNTER — Ambulatory Visit
Admission: RE | Admit: 2019-05-11 | Discharge: 2019-05-11 | Disposition: A | Discharge status: Home / Self Care | Payer: Medicare Other | Source: Ambulatory Visit | Attending: General Surgery | Admitting: General Surgery

## 2019-05-11 DIAGNOSIS — R1033 Periumbilical pain: Secondary | ICD-10-CM | POA: Insufficient documentation

## 2019-05-11 MED ORDER — IOHEXOL 300 MG/ML  SOLN
100.0000 mL | Freq: Once | INTRAMUSCULAR | Status: AC | PRN
  Administered 2019-05-11: 100 mL via INTRAVENOUS

## 2019-05-11 NOTE — Telephone Encounter (Signed)
Patient's surgery to be scheduled for 06/12/19 at [facility] with Dr. Bary Castilla.   The patient is aware he  will need to Pre-Admit. Patient will check in at the [place] entrance due to COVID-19 restrictions and will then be escorted to the [place], [address] (first floor). He will also have Covid-19 testing done that day. Patient will be contacted once Pre-admission appointment has been arranged with date and time.   He will be seen here by Dr Bary Castilla on 06/04/19 at 2:00 pm for a pre op appointment.   Patient aware to be NPO after midnight and have a driver.   He is aware to check in at the [place] entrance where he will be screened for the coronavirus and then sent to Same Day Surgery.   Patient aware that he may have no visitors and driver will need to wait in the car due to COVID-19 restrictions.   The patient verbalizes understanding of the above.   The patient is aware to call the office should he have further questions.

## 2019-05-11 NOTE — Telephone Encounter (Signed)
Message left for patient to call back to see about scheduling surgery.  Per Dr Bary Castilla, CT with no new findings, still with cyst in pancreas. Can schedule umbilical hernia surgery when he desires. Will need preop visit to review alcohol use prior to surgery.

## 2019-05-12 ENCOUNTER — Telehealth: Payer: Self-pay | Admitting: Physician Assistant

## 2019-05-12 ENCOUNTER — Ambulatory Visit: Payer: Self-pay

## 2019-05-12 ENCOUNTER — Telehealth: Payer: Self-pay

## 2019-05-12 DIAGNOSIS — F1010 Alcohol abuse, uncomplicated: Secondary | ICD-10-CM

## 2019-05-12 DIAGNOSIS — E1165 Type 2 diabetes mellitus with hyperglycemia: Secondary | ICD-10-CM

## 2019-05-12 NOTE — Telephone Encounter (Signed)
Yes his A1c is very high so his fasting sugars will be high too. They should come down slowly over time as we start medications. See him in follow up.

## 2019-05-12 NOTE — Telephone Encounter (Signed)
Pt called saying his blood sugar levels are running over 200. Today it was 294  He  Has an appt with Adriana on Friday  Please advise [phone]  Thanks teri

## 2019-05-12 NOTE — Telephone Encounter (Signed)
Patient advised as below.  

## 2019-05-12 NOTE — Chronic Care Management (AMB) (Signed)
   Chronic Care Management   Unsuccessful Call Note 05/12/2019 Name: Shaun Odonnell MRN: [id number] DOB: [birth year]  Shaun Odonnell is a 53 y.o. year old male who is a primary care patient of Trinna Post, [place]. The CCM team was consulted for assistance with chronic disease management and care coordination needs.    Shaun Odonnell, was engaged by [facility] RN CM Janalyn Shy 05/06/2019 and provided education on DM self care/management. He is scheduled for a follow up call today at 2:00 to discuss progression towards goals and for CCM RN CM to provided additional education and support as needed. Of note, Shaun Odonnell has had an ED visit 05/07/2019 for ETOH abuse.   Was unable to reach patient via telephone today for follow up at scheduled appointment time. I have left HIPAA compliant voicemail asking patient to return my call. (unsuccessful outreach #1).   Plan: Will follow-up within 7 business days via telephone.    Shaun Rone E. Rollene Rotunda, RN, BSN Nurse Care Coordinator [facility] Care Management [phone]

## 2019-05-15 ENCOUNTER — Other Ambulatory Visit: Payer: Self-pay

## 2019-05-15 ENCOUNTER — Telehealth: Payer: Self-pay | Admitting: Family Medicine

## 2019-05-15 ENCOUNTER — Ambulatory Visit (INDEPENDENT_AMBULATORY_CARE_PROVIDER_SITE_OTHER): Payer: Medicare Other | Admitting: Physician Assistant

## 2019-05-15 DIAGNOSIS — E1165 Type 2 diabetes mellitus with hyperglycemia: Secondary | ICD-10-CM

## 2019-05-15 MED ORDER — ONETOUCH DELICA LANCETS 33G MISC: 1 refills | Status: DC

## 2019-05-15 MED ORDER — METFORMIN HCL ER 500 MG PO TB24: ORAL_TABLET | ORAL | 0 refills | Status: DC

## 2019-05-15 MED ORDER — GLUCOSE BLOOD VI STRP: ORAL_STRIP | 12 refills | Status: DC

## 2019-05-15 NOTE — Telephone Encounter (Signed)
Please review office note dated 04/29/2019   Thanks,   -Mickel Baas

## 2019-05-15 NOTE — Telephone Encounter (Signed)
I've spoken with patient during our visit. He does not want to switch to an MD right now, reports his mother has been asking about this. Assured him that if he wants to transfer care, he may see Dr. Jacinto Reap.

## 2019-05-15 NOTE — Telephone Encounter (Signed)
Patient came by office and stated he had just been diagnosed with "Type I" DM.  I questioned him to make sure it was type 1 and he said yes.    He said he wanted to see an MD instead of a PA for this and ask to see Dr. Caryn Section.   I told him that Id ask and let him know if Dr. Caryn Section would take him as a new patient.

## 2019-05-15 NOTE — Telephone Encounter (Signed)
i'm not taking new patients

## 2019-05-15 NOTE — Progress Notes (Signed)
Patient: Shaun Odonnell Male    DOB: 12-Nov-1965   53 y.o.   MRN: [id number] Visit Date: 05/15/2019  Today's Provider: Trinna Post, PA-C   Chief Complaint  Patient presents with  . Follow-up   Subjective:     HPI  Follow up for diabetes  The patient was last seen for this 2 weeks ago. Changes made at last visit include started on Basaglar 10 units nightly and referred to CCM for diabetes education. Patient is eating a very high sugar, high carbohydrate diet. C-peptide is in normal range.   He reports excellent compliance with treatment. He feels that condition is Improved. He is not having side effects.   CMP Latest Ref Rng & Units 05/07/2019 04/29/2019 03/28/2019  Glucose 70 - 99 mg/dL 263(H) 259(H) 211(H)  BUN 6 - 20 mg/dL 11 11 9   Creatinine 0.61 - 1.24 mg/dL 0.50(L) 0.62(L) 0.70  Sodium 135 - 145 mmol/L 136 137 135  Potassium 3.5 - 5.1 mmol/L 4.0 4.2 4.0  Chloride 98 - 111 mmol/L 100 97 97(L)  CO2 22 - 32 mmol/L 22 20 22   Calcium 8.9 - 10.3 mg/dL 9.3 9.5 9.0  Total Protein 6.5 - 8.1 g/dL 7.7 6.8 7.4  Total Bilirubin 0.3 - 1.2 mg/dL 0.8 0.3 1.0  Alkaline Phos 38 - 126 U/L 103 125(H) 100  AST 15 - 41 U/L 17 9 19   ALT 0 - 44 U/L 18 14 20    Lipid Panel     Component Value Date/Time   CHOL 180 04/29/2019 1036   CHOL 140 08/03/2014 0620   TRIG 430 (H) 04/29/2019 1036   TRIG 241 (H) 08/03/2014 0620   HDL 33 (L) 04/29/2019 1036   HDL 35 (L) 08/03/2014 0620   CHOLHDL 5.5 (H) 04/29/2019 1036   VLDL 48 (H) 08/03/2014 0620   LDLCALC Comment 04/29/2019 1036   LDLCALC 57 08/03/2014 0620   ------------------------------------------------------------------------------------   Allergies  Allergen Reactions  . Morphine And Related Shortness Of Breath  . Klonopin [Clonazepam] Other (See Comments)    Dystonic Reaction    . Pimozide Other (See Comments)    Dystonic Reaction   . Stelazine [Trifluoperazine] Other (See Comments)    Dystonic Reaction  .  Fentanyl Nausea And Vomiting     Current Outpatient Medications:  .  albuterol (VENTOLIN HFA) 108 (90 Base) MCG/ACT inhaler, Inhale 2 puffs into the lungs every 6 (six) hours as needed., Disp: 8 g, Rfl: 3 .  ALPRAZolam (XANAX) 0.5 MG tablet, Take 0.5 mg by mouth 4 (four) times daily. Patient takes when wakes up(~0600)/ 1000/ 1600/ 2000, Disp: , Rfl:  .  empagliflozin (JARDIANCE) 25 MG TABS tablet, Take 25 mg by mouth daily., Disp: , Rfl:  .  Insulin Pen Needle (NOVOFINE) 32G X 6 MM MISC, Use 1 needle to inject 10 units basal insulin once nightly., Disp: 90 each, Rfl: 1 .  OLANZapine (ZYPREXA) 15 MG tablet, Take 15 mg by mouth daily at 8 pm. At 2000, Disp: , Rfl:  .  omeprazole (PRILOSEC) 40 MG capsule, , Disp: , Rfl:  .  ondansetron (ZOFRAN) 8 MG tablet, Take 1 tablet (8 mg total) by mouth every 8 (eight) hours as needed for nausea or vomiting., Disp: 20 tablet, Rfl: 0 .  ondansetron (ZOFRAN-ODT) 8 MG disintegrating tablet, Take 1 tablet (8 mg total) by mouth every 8 (eight) hours as needed for nausea or vomiting., Disp: 20 tablet, Rfl: 0 .  sertraline (ZOLOFT) 100  MG tablet, Take 100 mg by mouth daily with breakfast. , Disp: , Rfl:  .  simvastatin (ZOCOR) 40 MG tablet, Take 1 tablet (40 mg total) by mouth daily. Takes At 1600, Disp: 90 tablet, Rfl: 0  Review of Systems  Social History   Tobacco Use  . Smoking status: Current Every Day Smoker    Packs/day: 1.00    Years: 30.00    Pack years: 30.00    Types: Cigarettes  . Smokeless tobacco: Former Systems developer    Types: Snuff  Substance Use Topics  . Alcohol use: Yes      Objective:   There were no vitals taken for this visit. There were no vitals filed for this visit.   Physical Exam Constitutional:      Appearance: Normal appearance.  Cardiovascular:     Rate and Rhythm: Normal rate and regular rhythm.     Pulses: Normal pulses.     Heart sounds: Normal heart sounds.  Pulmonary:     Effort: Pulmonary effort is normal.      Breath sounds: Normal breath sounds.  Skin:    General: Skin is warm and dry.  Neurological:     Mental Status: He is alert.  Psychiatric:        Mood and Affect: Mood normal.        Behavior: Behavior normal.      No results found for any visits on 05/15/19.     Assessment & Plan    1. Type 2 diabetes mellitus with hyperglycemia, without long-term current use of insulin ([place])  He will continue with 10 units basaglar nightly. He will also continue with jardiance 25 mg nightly. I will start him on metformin and titrate to maximum dose to help with hyperglycemia and also to sensitize to insulin. May be able to discontinue insulin if dietary habits improve.   - metFORMIN (GLUCOPHAGE XR) 500 MG 24 hr tablet; Take 1 tab in morning x 1 wk. Then 1 tab 2x daily. Then 2 tabs in morning and 1 tab at night. Then take 2 tabs 2x daily.  Dispense: 180 tablet; Refill: 0 - OneTouch Delica Lancets 69S MISC; Use to check fasting sugar once daily.  Dispense: 100 each; Refill: 1 - glucose blood test strip; Use as instructed  Dispense: 100 each; Refill: 12  The entirety of the information documented in the History of Present Illness, Review of Systems and Physical Exam were personally obtained by me. Portions of this information were initially documented by Lynford Humphrey, CMA and reviewed by me for thoroughness and accuracy.   F/u 6 weeks for DM     Trinna Post, PA-C  [facility]

## 2019-05-15 NOTE — Patient Instructions (Signed)
Diabetes Mellitus and Exercise Exercising regularly is important for your overall health, especially when you have diabetes (diabetes mellitus). Exercising is not only about losing weight. It has many other health benefits, such as increasing muscle strength and bone density and reducing body fat and stress. This leads to improved fitness, flexibility, and endurance, all of which result in better overall health. Exercise has additional benefits for people with diabetes, including:  Reducing appetite.  Helping to lower and control blood glucose.  Lowering blood pressure.  Helping to control amounts of fatty substances (lipids) in the blood, such as cholesterol and triglycerides.  Helping the body to respond better to insulin (improving insulin sensitivity).  Reducing how much insulin the body needs.  Decreasing the risk for heart disease by: ? Lowering cholesterol and triglyceride levels. ? Increasing the levels of good cholesterol. ? Lowering blood glucose levels. What is my activity plan? Your health care provider or certified diabetes educator can help you make a plan for the type and frequency of exercise (activity plan) that works for you. Make sure that you:  Do at least 150 minutes of moderate-intensity or vigorous-intensity exercise each week. This could be brisk walking, biking, or water aerobics. ? Do stretching and strength exercises, such as yoga or weightlifting, at least 2 times a week. ? Spread out your activity over at least 3 days of the week.  Get some form of physical activity every day. ? Do not go more than 2 days in a row without some kind of physical activity. ? Avoid being inactive for more than 30 minutes at a time. Take frequent breaks to walk or stretch.  Choose a type of exercise or activity that you enjoy, and set realistic goals.  Start slowly, and gradually increase the intensity of your exercise over time. What do I need to know about managing my  diabetes?   Check your blood glucose before and after exercising. ? If your blood glucose is 240 mg/dL (13.3 mmol/L) or higher before you exercise, check your urine for ketones. If you have ketones in your urine, do not exercise until your blood glucose returns to normal. ? If your blood glucose is 100 mg/dL (5.6 mmol/L) or lower, eat a snack containing 15-20 grams of carbohydrate. Check your blood glucose 15 minutes after the snack to make sure that your level is above 100 mg/dL (5.6 mmol/L) before you start your exercise.  Know the symptoms of low blood glucose (hypoglycemia) and how to treat it. Your risk for hypoglycemia increases during and after exercise. Common symptoms of hypoglycemia can include: ? Hunger. ? Anxiety. ? Sweating and feeling clammy. ? Confusion. ? Dizziness or feeling light-headed. ? Increased heart rate or palpitations. ? Blurry vision. ? Tingling or numbness around the mouth, lips, or tongue. ? Tremors or shakes. ? Irritability.  Keep a rapid-acting carbohydrate snack available before, during, and after exercise to help prevent or treat hypoglycemia.  Avoid injecting insulin into areas of the body that are going to be exercised. For example, avoid injecting insulin into: ? The arms, when playing tennis. ? The legs, when jogging.  Keep records of your exercise habits. Doing this can help you and your health care provider adjust your diabetes management plan as needed. Write down: ? Food that you eat before and after you exercise. ? Blood glucose levels before and after you exercise. ? The type and amount of exercise you have done. ? When your insulin is expected to peak, if you use   insulin. Avoid exercising at times when your insulin is peaking.  When you start a new exercise or activity, work with your health care provider to make sure the activity is safe for you, and to adjust your insulin, medicines, or food intake as needed.  Drink plenty of water while  you exercise to prevent dehydration or heat stroke. Drink enough fluid to keep your urine clear or pale yellow. Summary  Exercising regularly is important for your overall health, especially when you have diabetes (diabetes mellitus).  Exercising has many health benefits, such as increasing muscle strength and bone density and reducing body fat and stress.  Your health care provider or certified diabetes educator can help you make a plan for the type and frequency of exercise (activity plan) that works for you.  When you start a new exercise or activity, work with your health care provider to make sure the activity is safe for you, and to adjust your insulin, medicines, or food intake as needed. This information is not intended to replace advice given to you by your health care provider. Make sure you discuss any questions you have with your health care provider. Document Released: 01/12/2004 Document Revised: 05/16/2017 Document Reviewed: 04/02/2016 Elsevier Patient Education  2020 Elsevier Inc.  

## 2019-05-15 NOTE — Telephone Encounter (Signed)
Pt advised.   Thanks,   -Laura  

## 2019-05-19 ENCOUNTER — Telehealth: Payer: Self-pay | Admitting: *Deleted

## 2019-05-19 NOTE — Telephone Encounter (Signed)
Patient called the office today.   He states he is scheduled for surgery on 06-12-19 with Dr. Bary Castilla.   Patient was told we weren't sure if he could phone interview or if he would need to go for an in office pre-admit appointment.   The patient has been notified that he will need to go in for an office appointment on 06-09-19 at 1:30 pm. Patient aware to check in at the [facility] and have COVID testing done after at the [facility] building drive thru.   Note routed to Caryl-Lyn.

## 2019-05-20 ENCOUNTER — Ambulatory Visit (INDEPENDENT_AMBULATORY_CARE_PROVIDER_SITE_OTHER): Payer: Medicare Other | Admitting: Physician Assistant

## 2019-05-20 DIAGNOSIS — R200 Anesthesia of skin: Secondary | ICD-10-CM | POA: Diagnosis not present

## 2019-05-20 NOTE — Progress Notes (Signed)
Patient: Shaun Odonnell Male    DOB: [birth year]   53 y.o.   MRN: [id number] Visit Date: 05/22/2019  Today's Provider: Trinna Post, PA-C   Chief Complaint  Patient presents with  . Numbness   Subjective:    Virtual Visit via Telephone Note  I connected with Shaun Odonnell on 05/22/19 at  1:20 PM EDT by telephone and verified that I am speaking with the correct person using two identifiers.   I discussed the limitations, risks, security and privacy concerns of performing an evaluation and management service by telephone and the availability of in person appointments. I also discussed with the patient that there may be a patient responsible charge related to this service. The patient expressed understanding and agreed to proceed.  Patient location: home Provider location: [place] office  Persons involved in the visit: patient, provider    HPI   Patient with history of uncontrolled diabetes presents today with numbness in his left pinky finger ongoing one year and his right pinky finger for a few weeks. Reports he has numbness in his left pinky finger distal to the DIP and in the same place in his right pinky finger. Denies injuries. Denies numbness, pain, burning, tingling in other fingers. Denies history of carpal tunnel or ulnar tunnel syndrome.   Allergies  Allergen Reactions  . Morphine And Related Shortness Of Breath  . Klonopin [Clonazepam] Other (See Comments)    Dystonic Reaction    . Pimozide Other (See Comments)    Dystonic Reaction   . Stelazine [Trifluoperazine] Other (See Comments)    Dystonic Reaction  . Fentanyl Nausea And Vomiting     Current Outpatient Medications:  .  albuterol (VENTOLIN HFA) 108 (90 Base) MCG/ACT inhaler, Inhale 2 puffs into the lungs every 6 (six) hours as needed., Disp: 8 g, Rfl: 3 .  ALPRAZolam (XANAX) 0.5 MG tablet, Take 0.5 mg by mouth 4 (four) times daily. Patient takes when wakes up(~0600)/  1000/ 1600/ 2000, Disp: , Rfl:  .  empagliflozin (JARDIANCE) 25 MG TABS tablet, Take 25 mg by mouth daily., Disp: , Rfl:  .  glucose blood test strip, Use as instructed, Disp: 100 each, Rfl: 12 .  Insulin Pen Needle (NOVOFINE) 32G X 6 MM MISC, Use 1 needle to inject 10 units basal insulin once nightly., Disp: 90 each, Rfl: 1 .  metFORMIN (GLUCOPHAGE XR) 500 MG 24 hr tablet, Take 1 tab in morning x 1 wk. Then 1 tab 2x daily. Then 2 tabs in morning and 1 tab at night. Then take 2 tabs 2x daily., Disp: 180 tablet, Rfl: 0 .  OLANZapine (ZYPREXA) 15 MG tablet, Take 15 mg by mouth daily at 8 pm. At 2000, Disp: , Rfl:  .  omeprazole (PRILOSEC) 40 MG capsule, , Disp: , Rfl:  .  ondansetron (ZOFRAN) 8 MG tablet, Take 1 tablet (8 mg total) by mouth every 8 (eight) hours as needed for nausea or vomiting., Disp: 20 tablet, Rfl: 0 .  ondansetron (ZOFRAN-ODT) 8 MG disintegrating tablet, Take 1 tablet (8 mg total) by mouth every 8 (eight) hours as needed for nausea or vomiting., Disp: 20 tablet, Rfl: 0 .  OneTouch Delica Lancets 57X MISC, Use to check fasting sugar once daily., Disp: 100 each, Rfl: 1 .  sertraline (ZOLOFT) 100 MG tablet, Take 100 mg by mouth daily with breakfast. , Disp: , Rfl:  .  simvastatin (ZOCOR) 40 MG tablet, Take 1 tablet (40  mg total) by mouth daily. Takes At 1600, Disp: 90 tablet, Rfl: 0  Review of Systems  Social History   Tobacco Use  . Smoking status: Current Every Day Smoker    Packs/day: 1.00    Years: 30.00    Pack years: 30.00    Types: Cigarettes  . Smokeless tobacco: Former Systems developer    Types: Snuff  Substance Use Topics  . Alcohol use: Yes      Objective:   There were no vitals taken for this visit. There were no vitals filed for this visit.   Physical Exam Pulmonary:     Comments: Patient speaking in complete sentences.      No results found for any visits on 05/20/19.     Assessment & Plan    1. Hand numbness  Would be unusual presentation for  diabetic neuropathy. Could be ulnar neuropathy. Patient is OK with observing and touching base at next visit. Return if worsening.  The entirety of the information documented in the History of Present Illness, Review of Systems and Physical Exam were personally obtained by me. Portions of this information were initially documented by Meyer Cory, CMA and reviewed by me for thoroughness and accuracy.   F/u PRN      Trinna Post, PA-C  [facility]

## 2019-05-21 ENCOUNTER — Ambulatory Visit: Payer: Self-pay

## 2019-05-21 ENCOUNTER — Telehealth: Payer: Self-pay

## 2019-05-21 DIAGNOSIS — F1010 Alcohol abuse, uncomplicated: Secondary | ICD-10-CM

## 2019-05-21 DIAGNOSIS — E1165 Type 2 diabetes mellitus with hyperglycemia: Secondary | ICD-10-CM

## 2019-05-21 NOTE — Chronic Care Management (AMB) (Signed)
   Chronic Care Management   Unsuccessful Call Note 05/21/2019 Name: KALLON CAYLOR MRN: [id number] DOB: [birth year]  [name] is a 53 y.o. year old male who is a primary care patient of Trinna Post, [place]. The CCM team was consulted for assistance with chronic disease management and care coordination needs.    Mr. Plitt, was engaged by [facility] RN CM Janalyn Shy 05/06/2019 and provided education on DM self care/management. He was scheduled for a follow up call 05/12/2019  at 2:00 to discuss progression towards goals and for CCM RN CM to provided additional education and support as needed but was unavailable.   Was unable to reach patient via telephone today second follow up attempt. Was unable to leave message as phone continued to ring (unsuccessful outreach #2).   Plan: Will follow-up within 7 business days via telephone.    Cullen Vanallen E. Rollene Rotunda, RN, BSN Nurse Care Coordinator [facility] Care Management [phone]

## 2019-05-25 ENCOUNTER — Telehealth: Payer: Self-pay | Admitting: Physician Assistant

## 2019-05-25 ENCOUNTER — Telehealth: Payer: Self-pay | Admitting: *Deleted

## 2019-05-25 DIAGNOSIS — E1165 Type 2 diabetes mellitus with hyperglycemia: Secondary | ICD-10-CM

## 2019-05-25 MED ORDER — SIMVASTATIN 40 MG PO TABS: 40.0000 mg | ORAL_TABLET | Freq: Every day | ORAL | 0 refills | Status: DC

## 2019-05-25 NOTE — Telephone Encounter (Signed)
Patient scheduled to come in on 06/04/19 and scheduled for surgery on 06/12/19, he needs to be rescheduled with a different provider. Please call and advise

## 2019-05-25 NOTE — Telephone Encounter (Signed)
Pt called saying he left his Simvastatin 40 mg at home when he went on vacation this week.  He needs a weeks worth sent   [facility] [address]  [place] [address]  [phone]  CB#  [phone]  [place]

## 2019-05-25 NOTE — Telephone Encounter (Signed)
Sent!

## 2019-05-25 NOTE — Telephone Encounter (Signed)
Please advise 

## 2019-05-26 NOTE — Telephone Encounter (Signed)
Message left for patient to call the office.   We need to notify patient that Dr. Bary Castilla is no longer with the practice.   If patient wants to possibly keep the same surgery date, we could have patient reschedule pre-op visit with Dr. Celine Ahr.

## 2019-05-27 ENCOUNTER — Encounter: Payer: Self-pay | Admitting: Physician Assistant

## 2019-05-28 ENCOUNTER — Telehealth: Payer: Self-pay

## 2019-05-28 ENCOUNTER — Ambulatory Visit: Payer: Self-pay

## 2019-05-28 NOTE — Telephone Encounter (Signed)
Second message left for the patient to call the office back to see about scheduling him with another surgeon.

## 2019-05-28 NOTE — Progress Notes (Signed)
This encounter was created in error - please disregard.

## 2019-05-28 NOTE — Chronic Care Management (AMB) (Signed)
   Chronic Care Management   Unsuccessful Call Note 05/21/2019 Name: Shaun Odonnell       MRN: [id number]       DOB: 28-Aug-1966  Daisey Must Godwinis a 53 y.o.year old Shaun Odonnell is a primary care patient of Paulene Floor. The CCM team was consulted for assistance with chronic disease management and care coordination needs.   [name], was engaged by [facility] RN CM Janalyn Shy 05/06/2019 and provided education on DM self care/management. He was scheduled for a follow up call 05/12/2019  at 2:00 to discuss progression towards goals and for CCM RN CM to provided additional education and support as needed but was unavailable.   Was unable to reach patient via telephone today second follow up attempt. Hipaa compliant VM message left requesting return call (unsuccessful outreach #3).  Plan: Will suspend outreach calls as 3 unsuccessful attempts have been made. Will be happy to engage patient if he chooses to return call.   Ilayda Toda E. Rollene Rotunda, RN, BSN Nurse Care Coordinator [facility] Care Management [phone]

## 2019-06-01 ENCOUNTER — Other Ambulatory Visit: Payer: Self-pay | Admitting: General Surgery

## 2019-06-01 ENCOUNTER — Telehealth: Payer: Self-pay

## 2019-06-01 DIAGNOSIS — K429 Umbilical hernia without obstruction or gangrene: Secondary | ICD-10-CM

## 2019-06-01 NOTE — Telephone Encounter (Signed)
Patient called back and has decided to go with Dr Celine Ahr for surgery. He will come in to be seen by her on 06/09/19 at 11:30 am. He will have his surgery as scheduled on 06/12/19. He is aware to pre admit on 06/09/19 at 1:30 pm. He will also have his Covid-19 testing done that day.

## 2019-06-01 NOTE — Telephone Encounter (Signed)
Patient called and wants to be scheduled with Dr.Cannon

## 2019-06-01 NOTE — Telephone Encounter (Signed)
Patient states that he wants to look into the other providers to see who he would like to see. He will call us back with his decision. He will need to be seen by a provider prior to surgery.

## 2019-06-02 ENCOUNTER — Telehealth: Payer: Self-pay

## 2019-06-02 NOTE — Telephone Encounter (Signed)
Spoke with the patient and he is scheduled for Pre Admit testing on 06/18/19 at 10:00 am. He is aware to enter in through the [place] entrance.

## 2019-06-02 NOTE — Telephone Encounter (Signed)
The patient called back and states that he would like to instead see Dr Hampton Abbot for his surgery. I let him know that this would push back his date for surgery with Dr Mont Dutton schedule. He is fine with that. The patient is scheduled for surgery with Dr Hampton Abbot on 06/22/19. He will pre admit and have Covid-19 testing done on 06/18/19. We will call him with his Pre admit time. He is aware to call the Friday prior to surgery to get his arrival time. He will be seen here in office by Dr Hampton Abbot on 06/16/19 at 11:00 am.

## 2019-06-04 ENCOUNTER — Ambulatory Visit: Payer: Medicare Other | Admitting: General Surgery

## 2019-06-08 ENCOUNTER — Telehealth: Payer: Self-pay | Admitting: Physician Assistant

## 2019-06-08 DIAGNOSIS — E1165 Type 2 diabetes mellitus with hyperglycemia: Secondary | ICD-10-CM

## 2019-06-08 MED ORDER — LEVEMIR FLEXTOUCH 100 UNIT/ML ~~LOC~~ SOPN: 10.0000 [IU] | PEN_INJECTOR | Freq: Every day | SUBCUTANEOUS | 11 refills | Status: DC

## 2019-06-08 NOTE — Telephone Encounter (Signed)
Sent in Rx for Levemir as this is preferred over [place]. Please call patient and let him know it would be fine to take Levemir, he will take it a 10 units nightly just like basaglar. It is the same thing.

## 2019-06-09 ENCOUNTER — Ambulatory Visit: Payer: Medicare Other | Admitting: General Surgery

## 2019-06-09 NOTE — Telephone Encounter (Signed)
Patient reports he got Lantus and is taking 10 units nightly

## 2019-06-11 ENCOUNTER — Other Ambulatory Visit: Payer: Self-pay | Admitting: Physician Assistant

## 2019-06-11 DIAGNOSIS — E1165 Type 2 diabetes mellitus with hyperglycemia: Secondary | ICD-10-CM

## 2019-06-11 NOTE — Telephone Encounter (Signed)
Sam with pharmacy [phone]  [place], [place]. [phone] (Phone) [phone] (Fax)    Needing to discuss pt insulin Rx's.  Thanks, [employer]

## 2019-06-11 NOTE — Telephone Encounter (Signed)
Sam at [facility] wants to know if patient should continue lantus or start levemir? Lantus was given to patient about 3 weeks now. And a new prescription was sent with the Levemir. Please advise.

## 2019-06-11 NOTE — Telephone Encounter (Signed)
I gave him a basaglar pen sample from the office. His insurance prefers levemir. When he went to pick up the insulin, I believe he paid out of pocket for lantus. When he finishes his current pen, I think his refill can be levemir because that will be more affordable to him.

## 2019-06-12 NOTE — Telephone Encounter (Signed)
Please advise on which insulin you want him to continue. Lantus or levemir?

## 2019-06-12 NOTE — Telephone Encounter (Signed)
Shaun Odonnell advised as below. He reports that lantus is the same as basaglar just made by a different company. Shaun Odonnell reports that lantus is covered by his insurance. So for now they have put the levemir on hold.

## 2019-06-12 NOTE — Telephone Encounter (Signed)
lantus is fine, it's the same thing.

## 2019-06-12 NOTE — Telephone Encounter (Signed)
basaglar pen  - 1 or 2 injections left  Pt needing to know which of the following is he supposed to be taking:  lantus pen - new pen - or Levemir - rejected this   Please call pt back asap at [phone].  Thanks, [employer]

## 2019-06-12 NOTE — Telephone Encounter (Signed)
Patient advised as below. Patient reports he will continue to take Lantus.

## 2019-06-14 IMAGING — CR CHEST - 2 VIEW
2 series · 3 of 3 positions shown · non-contrast
Comparison: 07/02/2018

CLINICAL DATA: Cough for several months

EXAM:
CHEST - 2 VIEW

[Series 1: chest pa · 0.14mm/px · 2 of 2 slices shown]
[im 1/2]
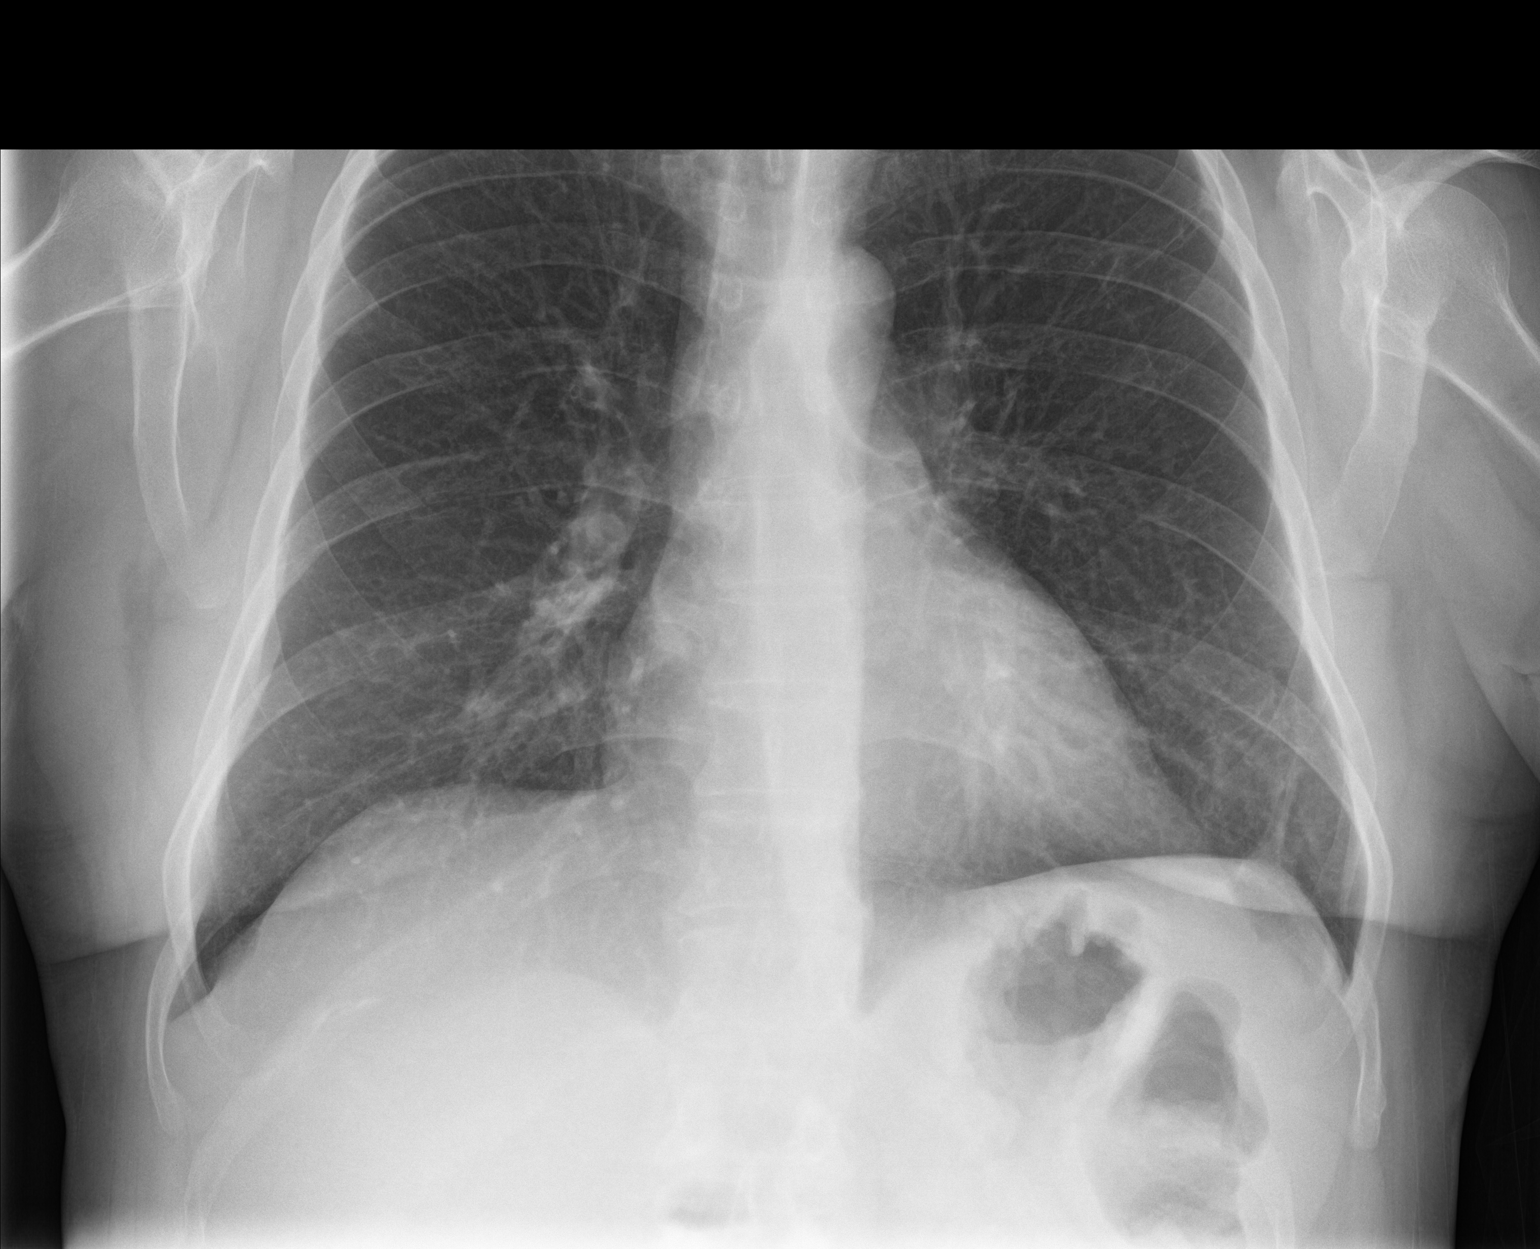
[im 2/2]
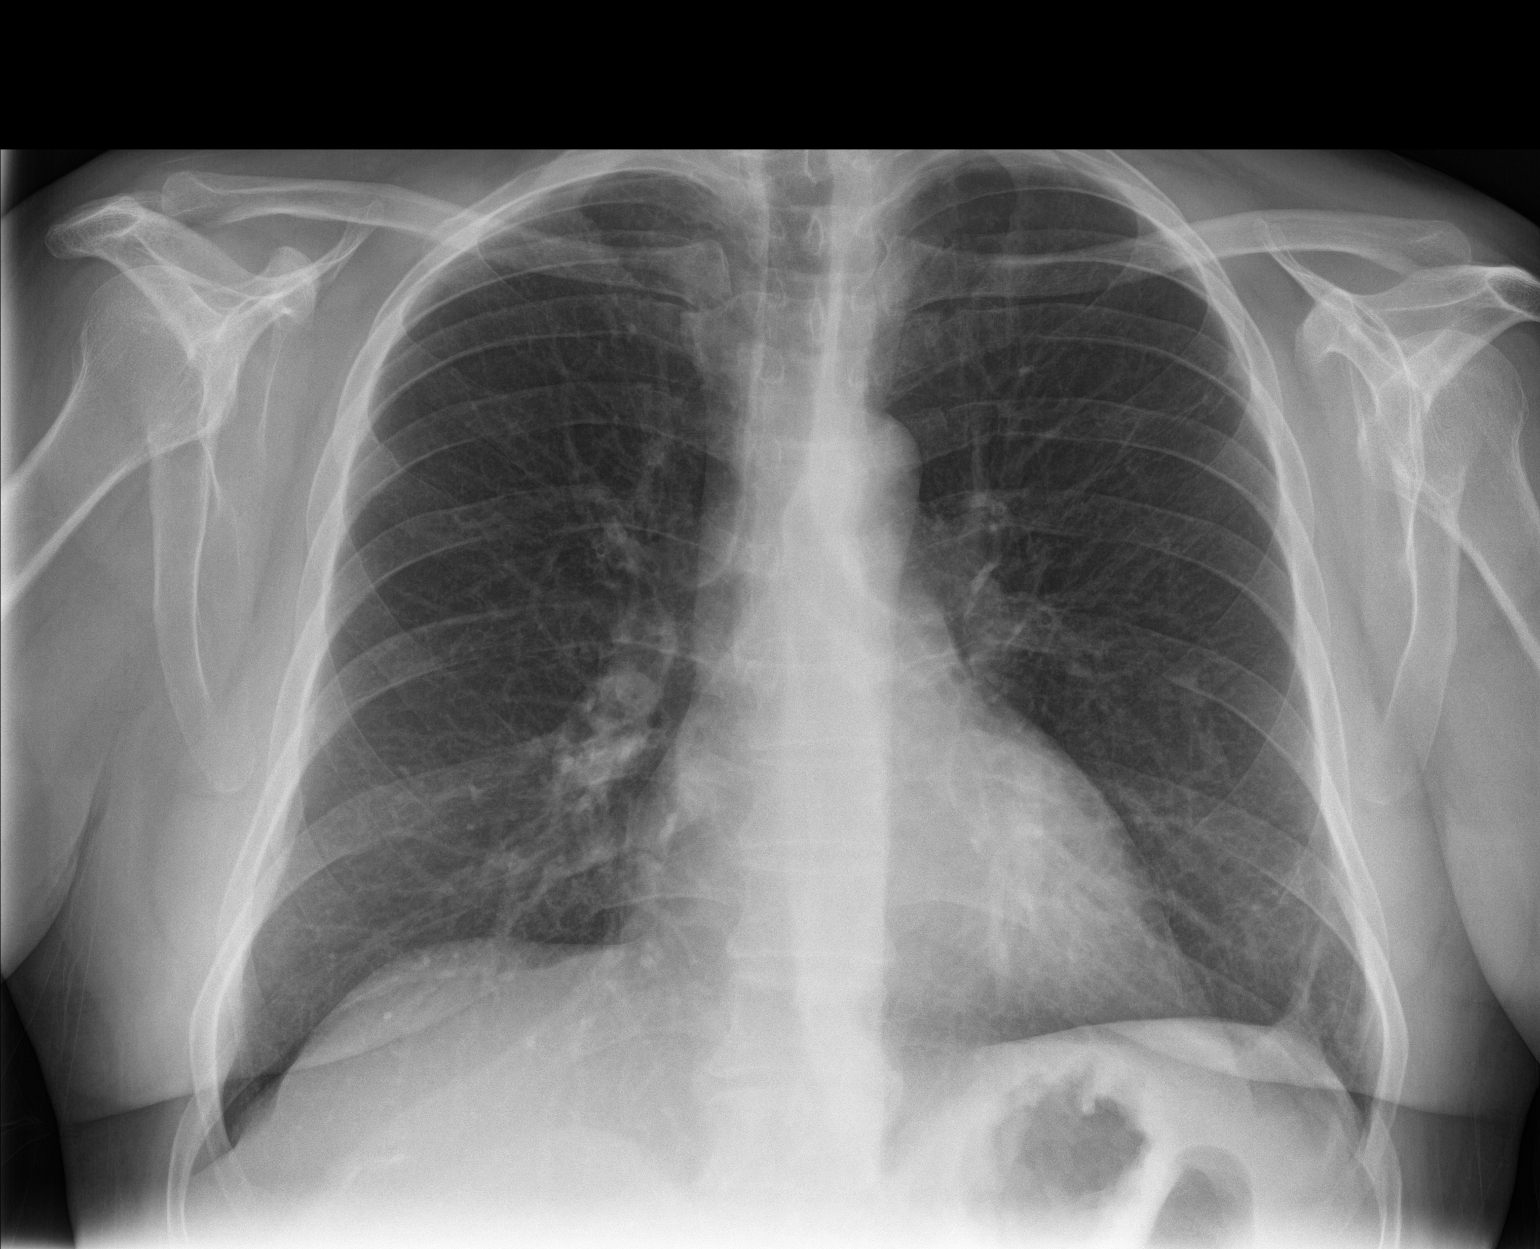

[chest lat]
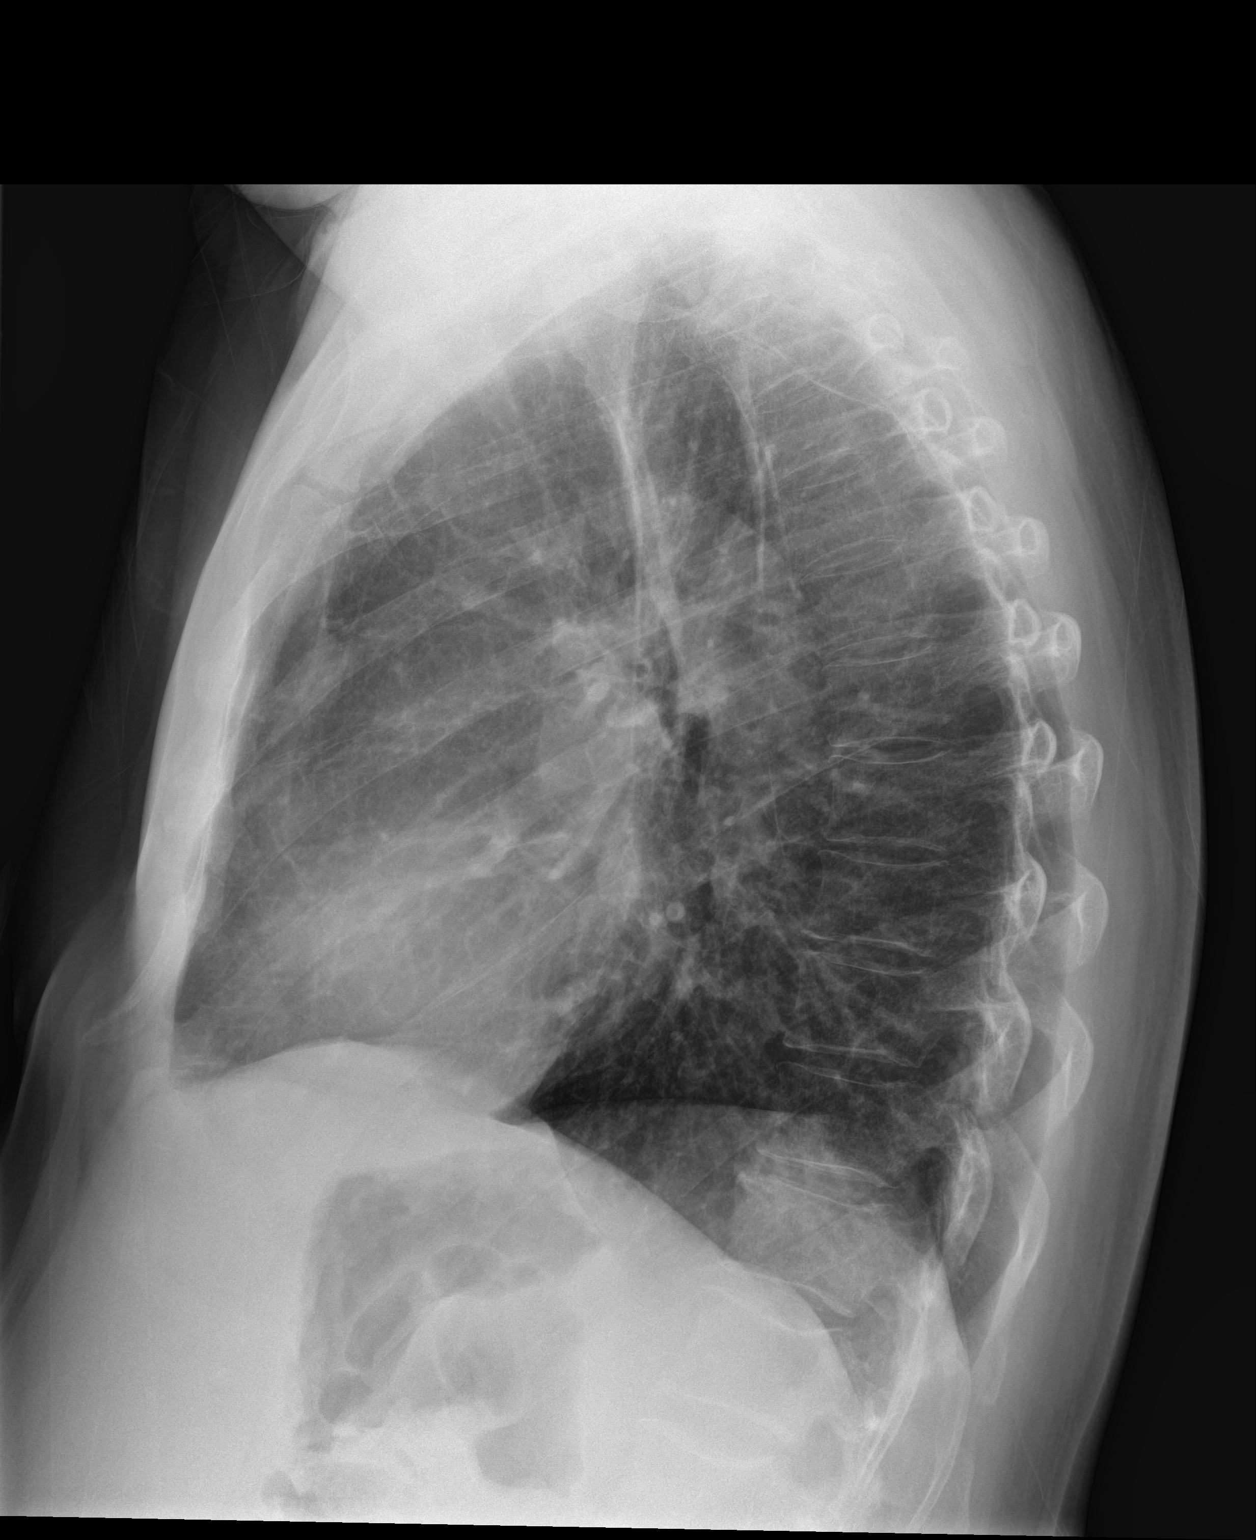

[3 of 3 positions shown; findings below may reference images not displayed]

FINDINGS: Cardiac shadows within normal limits. The lungs are well aerated
bilaterally. Minimal scarring is again seen in the left base. No
focal infiltrate or effusion is noted. No bony abnormality is seen.
IMPRESSION: Stable left basilar scarring.  No other focal abnormality is seen.

## 2019-06-15 ENCOUNTER — Other Ambulatory Visit: Payer: Self-pay | Admitting: Physician Assistant

## 2019-06-15 DIAGNOSIS — E1165 Type 2 diabetes mellitus with hyperglycemia: Secondary | ICD-10-CM

## 2019-06-16 ENCOUNTER — Other Ambulatory Visit: Payer: Self-pay

## 2019-06-16 ENCOUNTER — Ambulatory Visit (INDEPENDENT_AMBULATORY_CARE_PROVIDER_SITE_OTHER): Payer: Medicare Other | Admitting: Surgery

## 2019-06-16 ENCOUNTER — Encounter: Payer: Self-pay | Admitting: Surgery

## 2019-06-16 VITALS — BP 128/77 | HR 70 | Temp 97.5°F | Ht 68.0 in | Wt 176.4 lb

## 2019-06-16 DIAGNOSIS — K429 Umbilical hernia without obstruction or gangrene: Secondary | ICD-10-CM | POA: Diagnosis not present

## 2019-06-16 NOTE — Progress Notes (Signed)
06/16/2019  History of Present Illness: Shaun Odonnell is a 53 y.o. male presenting for preop H&P update.  He saw Dr. Bary Castilla last on 4/65/68 for umbilical hernia.  He is scheduled for surgery on 06/22/19.  He reports that he is getting some discomfort at the umbilical site more frequently.  The hernia bulges out and feels that more tissue is bulging through recently as well.  Denies any significant pain.  Denies any obstructive symptoms.    Past Medical History: Past Medical History:  Diagnosis Date  . Alcohol abuse 04/29/2019  . Allergy   . Anxiety   . ARDS (adult respiratory distress syndrome) ([place])   . ARDS (adult respiratory distress syndrome) ([place])   . Bipolar disorder ([place])   . Borderline diabetes   . Cataract   . Chronic kidney disease   . COPD (chronic obstructive pulmonary disease) ([facility])    chronic cough and wheezing  . Depression   . Diabetes mellitus without complication ([place])   . Dizziness    medication related  . Dyspnea    with exertion  . Elevated lipids   . Fatty liver   . Hypercholesterolemia   . Pancreatitis   . Pneumonia    in past  . Pneumonia    in past  . Pre-diabetes    diet controlled  . Pulmonary embolism ([place])   . Schizoaffective disorder [facility])      Past Surgical History: Past Surgical History:  Procedure Laterality Date  . CATARACT EXTRACTION W/PHACO Right 03/26/2018   Procedure: CATARACT EXTRACTION PHACO AND INTRAOCULAR LENS PLACEMENT ([place]) RIGHT BORDERLINE DIABETIC;  Surgeon: Leandrew Koyanagi, MD;  Location: [place];  Service: Ophthalmology;  Laterality: Right;  . CATARACT EXTRACTION W/PHACO Left 04/23/2018   Procedure: CATARACT EXTRACTION PHACO AND INTRAOCULAR LENS PLACEMENT ([place])  LEFT BORDERLINE DIABETIC;  Surgeon: Leandrew Koyanagi, MD;  Location: [place];  Service: Ophthalmology;  Laterality: Left;  . COLONOSCOPY WITH PROPOFOL N/A 08/21/2017   Procedure: COLONOSCOPY WITH PROPOFOL;  Surgeon: Manya Silvas, MD;  Location: [facility] ENDOSCOPY;  Service: Endoscopy;  Laterality: N/A;  . EYE SURGERY    . HERNIA REPAIR      Home Medications: Prior to Admission medications   Medication Sig Start Date End Date Taking? Authorizing Provider  albuterol (VENTOLIN HFA) 108 (90 Base) MCG/ACT inhaler Inhale 2 puffs into the lungs every 6 (six) hours as needed. 04/29/19 07/28/19 Yes Trinna Post, PA-C  ALPRAZolam Duanne Moron) 0.5 MG tablet Take 0.5 mg by mouth 4 (four) times daily. Patient takes when wakes up(~0600)/ 1000/ 1600/ 2000 06/26/18  Yes [provider]  empagliflozin (JARDIANCE) 25 MG TABS tablet Take 25 mg by mouth daily.   Yes [provider]  glucose blood test strip Use as instructed 05/15/19  Yes Pollak, Adriana M, PA-C  Insulin Glargine (LANTUS) 100 UNIT/ML Solostar Pen Inject 10 Units into the skin at bedtime.    Yes [provider]  Insulin Pen Needle (NOVOFINE) 32G X 6 MM MISC Use 1 needle to inject 10 units basal insulin once nightly. 04/29/19  Yes Carles Collet M, PA-C  metFORMIN (GLUCOPHAGE-XR) 500 MG 24 hr tablet Take 2 tablets (1,000 mg total) by mouth 2 (two) times daily. 06/15/19 12/12/19 Yes Pollak, Adriana M, PA-C  OLANZapine (ZYPREXA) 15 MG tablet Take 15 mg by mouth daily at 8 pm. At 2000   Yes [provider]  ondansetron (ZOFRAN) 8 MG tablet Take 1 tablet (8 mg total) by mouth every 8 (eight)  hours as needed for nausea or vomiting. 04/29/19  Yes Pollak, Adriana M, PA-C  OneTouch Delica Lancets 64W MISC Use to check fasting sugar once daily. 05/15/19  Yes Carles Collet M, PA-C  sertraline (ZOLOFT) 100 MG tablet Take 100 mg by mouth daily with breakfast.    Yes [provider]  simvastatin (ZOCOR) 40 MG tablet Take 1 tablet (40 mg total) by mouth daily. Takes At 1600 05/25/19 08/23/19 Yes Trinna Post, PA-C    Allergies: Allergies  Allergen Reactions  . Morphine And Related Shortness Of Breath  . Klonopin [Clonazepam] Other (See  Comments)    Dystonic Reaction    . Pimozide Other (See Comments)    Dystonic Reaction   . Stelazine [Trifluoperazine] Other (See Comments)    Dystonic Reaction  . Fentanyl Nausea And Vomiting    Review of Systems: Review of Systems  Constitutional: Negative for chills and fever.  Respiratory: Negative for shortness of breath.   Cardiovascular: Negative for chest pain.  Gastrointestinal: Positive for abdominal pain (discomfort at umbilical hernia site). Negative for nausea and vomiting.    Physical Exam BP 128/77   Pulse 70   Temp (!) 97.5 F (36.4 C) (Temporal)   Ht 5\' 8"  (1.727 m)   Wt 176 lb 6.4 oz (80 kg)   SpO2 95%   BMI 26.82 kg/m  CONSTITUTIONAL: No acute distress HEENT:  Normocephalic, atraumatic, extraocular motion intact. RESPIRATORY:  Lungs are clear, and breath sounds are equal bilaterally. Normal respiratory effort without pathologic use of accessory muscles. CARDIOVASCULAR: Heart is regular without murmurs, gallops, or rubs. GI: The abdomen is soft, non-distended, non-tender to palpation, but with some discomfort when pressing on the umbilical hernia.  The hernia is small, partially reducible, without any overlying skin erythema or ulceration.  NEUROLOGIC:  Motor and sensation is grossly normal.  Cranial nerves are grossly intact. PSYCH:  Alert and oriented to person, place and time. Affect is normal.  Assessment and Plan: This is a 53 y.o. male with umbilical hernia, scheduled for repair on 06/22/19  Discussed with him the timeline for the day with him presenting to preop, surgery, recovery, and discharge to home.  Discussed with him that this is an outpatient surgery.  Discussed with him the pain medications that we would discharge him with, and he reports that oral dilaudid has worked well for him, but he cannot take any Fentanyl or Morphine because of side effects in the past.  I encouraged him to mention this to the anesthesia team during preop.  Post-op  restrictions were discussed as well and he understands.  Discussed with him the possibility of mesh placement if the defect is particularly larger than expected, though on exam is appears to be small.  All of his questions have been answered.  He is willing to proceed.  He has preop testing appointment on Thursday.  Face-to-face time spent with the patient and care providers was 15 minutes, with more than 50% of the time spent counseling, educating, and coordinating care of the patient.     Melvyn Neth, [facility]

## 2019-06-16 NOTE — Patient Instructions (Addendum)
The patient is aware to call back for any questions or new concerns. Surgery 04-29-62 Umbilical Hernia, Adult  A hernia is a bulge of tissue that pushes through an opening between muscles. An umbilical hernia happens in the abdomen, near the belly button (umbilicus). The hernia may contain tissues from the small intestine, large intestine, or fatty tissue covering the intestines (omentum). Umbilical hernias in adults tend to get worse over time, and they require surgical treatment. There are several types of umbilical hernias. You may have:  A hernia located just above or below the umbilicus (indirect hernia). This is the most common type of umbilical hernia in adults.  A hernia that forms through an opening formed by the umbilicus (direct hernia).  A hernia that comes and goes (reducible hernia). A reducible hernia may be visible only when you strain, lift something heavy, or cough. This type of hernia can be pushed back into the abdomen (reduced).  A hernia that traps abdominal tissue inside the hernia (incarcerated hernia). This type of hernia cannot be reduced.  A hernia that cuts off blood flow to the tissues inside the hernia (strangulated hernia). The tissues can start to die if this happens. This type of hernia requires emergency treatment. What are the causes? An umbilical hernia happens when tissue inside the abdomen presses on a weak area of the abdominal muscles. What increases the risk? You may have a greater risk of this condition if you:  Are obese.  Have had several pregnancies.  Have a buildup of fluid inside your abdomen (ascites).  Have had surgery that weakens the abdominal muscles. What are the signs or symptoms? The main symptom of this condition is a painless bulge at or near the belly button. A reducible hernia may be visible only when you strain, lift something heavy, or cough. Other symptoms may include:  Dull pain.  A feeling of pressure. Symptoms of a  strangulated hernia may include:  Pain that gets increasingly worse.  Nausea and vomiting.  Pain when pressing on the hernia.  Skin over the hernia becoming red or purple.  Constipation.  Blood in the stool. How is this diagnosed? This condition may be diagnosed based on:  A physical exam. You may be asked to cough or strain while standing. These actions increase the pressure inside your abdomen and force the hernia through the opening in your muscles. Your health care provider may try to reduce the hernia by pressing on it.  Your symptoms and medical history. How is this treated? Surgery is the only treatment for an umbilical hernia. Surgery for a strangulated hernia is done as soon as possible. If you have a small hernia that is not incarcerated, you may need to lose weight before having surgery. Follow these instructions at home:  Lose weight, if told by your health care provider.  Do not try to push the hernia back in.  Watch your hernia for any changes in color or size. Tell your health care provider if any changes occur.  You may need to avoid activities that increase pressure on your hernia.  Do not lift anything that is heavier than 10 lb (4.5 kg) until your health care provider says that this is safe.  Take over-the-counter and prescription medicines only as told by your health care provider.  Keep all follow-up visits as told by your health care provider. This is important. Contact a health care provider if:  Your hernia gets larger.  Your hernia becomes painful. Get help  right away if:  You develop sudden, severe pain near the area of your hernia.  You have pain as well as nausea or vomiting.  You have pain and the skin over your hernia changes color.  You develop a fever. This information is not intended to replace advice given to you by your health care provider. Make sure you discuss any questions you have with your health care provider. Document  Released: 03/23/2016 Document Revised: 12/04/2017 Document Reviewed: 04/22/2017 Elsevier Patient Education  2020 [employer].

## 2019-06-16 NOTE — H&P (View-Only) (Signed)
06/16/2019  History of Present Illness: Shaun Odonnell is a 53 y.o. male presenting for preop H&P update.  He saw Dr. Bary Castilla last on 9/56/38 for umbilical hernia.  He is scheduled for surgery on 06/22/19.  He reports that he is getting some discomfort at the umbilical site more frequently.  The hernia bulges out and feels that more tissue is bulging through recently as well.  Denies any significant pain.  Denies any obstructive symptoms.    Past Medical History: Past Medical History:  Diagnosis Date  . Alcohol abuse 04/29/2019  . Allergy   . Anxiety   . ARDS (adult respiratory distress syndrome) ([place])   . ARDS (adult respiratory distress syndrome) ([place])   . Bipolar disorder ([place])   . Borderline diabetes   . Cataract   . Chronic kidney disease   . COPD (chronic obstructive pulmonary disease) ([facility])    chronic cough and wheezing  . Depression   . Diabetes mellitus without complication ([place])   . Dizziness    medication related  . Dyspnea    with exertion  . Elevated lipids   . Fatty liver   . Hypercholesterolemia   . Pancreatitis   . Pneumonia    in past  . Pneumonia    in past  . Pre-diabetes    diet controlled  . Pulmonary embolism ([place])   . Schizoaffective disorder [facility])      Past Surgical History: Past Surgical History:  Procedure Laterality Date  . CATARACT EXTRACTION W/PHACO Right 03/26/2018   Procedure: CATARACT EXTRACTION PHACO AND INTRAOCULAR LENS PLACEMENT ([place]) RIGHT BORDERLINE DIABETIC;  Surgeon: Leandrew Koyanagi, MD;  Location: [place];  Service: Ophthalmology;  Laterality: Right;  . CATARACT EXTRACTION W/PHACO Left 04/23/2018   Procedure: CATARACT EXTRACTION PHACO AND INTRAOCULAR LENS PLACEMENT ([place])  LEFT BORDERLINE DIABETIC;  Surgeon: Leandrew Koyanagi, MD;  Location: [place];  Service: Ophthalmology;  Laterality: Left;  . COLONOSCOPY WITH PROPOFOL N/A 08/21/2017   Procedure: COLONOSCOPY WITH PROPOFOL;  Surgeon: Manya Silvas, MD;  Location[facility] ENDOSCOPY;  Service: Endoscopy;  Laterality: N/A;  . EYE SURGERY    . HERNIA REPAIR      Home Medications: Prior to Admission medications   Medication Sig Start Date End Date Taking? Authorizing Provider  albuterol (VENTOLIN HFA) 108 (90 Base) MCG/ACT inhaler Inhale 2 puffs into the lungs every 6 (six) hours as needed. 04/29/19 07/28/19 Yes Trinna Post, PA-C  ALPRAZolam Duanne Moron) 0.5 MG tablet Take 0.5 mg by mouth 4 (four) times daily. Patient takes when wakes up(~0600)/ 1000/ 1600/ 2000 06/26/18  Yes [provider]  empagliflozin (JARDIANCE) 25 MG TABS tablet Take 25 mg by mouth daily.   Yes [provider]  glucose blood test strip Use as instructed 05/15/19  Yes Pollak, Adriana M, PA-C  Insulin Glargine (LANTUS) 100 UNIT/ML Solostar Pen Inject 10 Units into the skin at bedtime.    Yes [provider]  Insulin Pen Needle (NOVOFINE) 32G X 6 MM MISC Use 1 needle to inject 10 units basal insulin once nightly. 04/29/19  Yes Carles Collet M, PA-C  metFORMIN (GLUCOPHAGE-XR) 500 MG 24 hr tablet Take 2 tablets (1,000 mg total) by mouth 2 (two) times daily. 06/15/19 12/12/19 Yes Pollak, Adriana M, PA-C  OLANZapine (ZYPREXA) 15 MG tablet Take 15 mg by mouth daily at 8 pm. At 2000   Yes [provider]  ondansetron (ZOFRAN) 8 MG tablet Take 1 tablet (8 mg total) by mouth every 8 (eight)  hours as needed for nausea or vomiting. 04/29/19  Yes Pollak, Adriana M, PA-C  OneTouch Delica Lancets 60O MISC Use to check fasting sugar once daily. 05/15/19  Yes Carles Collet M, PA-C  sertraline (ZOLOFT) 100 MG tablet Take 100 mg by mouth daily with breakfast.    Yes [provider]  simvastatin (ZOCOR) 40 MG tablet Take 1 tablet (40 mg total) by mouth daily. Takes At 1600 05/25/19 08/23/19 Yes Trinna Post, PA-C    Allergies: Allergies  Allergen Reactions  . Morphine And Related Shortness Of Breath  . Klonopin [Clonazepam] Other (See  Comments)    Dystonic Reaction    . Pimozide Other (See Comments)    Dystonic Reaction   . Stelazine [Trifluoperazine] Other (See Comments)    Dystonic Reaction  . Fentanyl Nausea And Vomiting    Review of Systems: Review of Systems  Constitutional: Negative for chills and fever.  Respiratory: Negative for shortness of breath.   Cardiovascular: Negative for chest pain.  Gastrointestinal: Positive for abdominal pain (discomfort at umbilical hernia site). Negative for nausea and vomiting.    Physical Exam BP 128/77   Pulse 70   Temp (!) 97.5 F (36.4 C) (Temporal)   Ht 5\' 8"  (1.727 m)   Wt 176 lb 6.4 oz (80 kg)   SpO2 95%   BMI 26.82 kg/m  CONSTITUTIONAL: No acute distress HEENT:  Normocephalic, atraumatic, extraocular motion intact. RESPIRATORY:  Lungs are clear, and breath sounds are equal bilaterally. Normal respiratory effort without pathologic use of accessory muscles. CARDIOVASCULAR: Heart is regular without murmurs, gallops, or rubs. GI: The abdomen is soft, non-distended, non-tender to palpation, but with some discomfort when pressing on the umbilical hernia.  The hernia is small, partially reducible, without any overlying skin erythema or ulceration.  NEUROLOGIC:  Motor and sensation is grossly normal.  Cranial nerves are grossly intact. PSYCH:  Alert and oriented to person, place and time. Affect is normal.  Assessment and Plan: This is a 53 y.o. male with umbilical hernia, scheduled for repair on 06/22/19  Discussed with him the timeline for the day with him presenting to preop, surgery, recovery, and discharge to home.  Discussed with him that this is an outpatient surgery.  Discussed with him the pain medications that we would discharge him with, and he reports that oral dilaudid has worked well for him, but he cannot take any Fentanyl or Morphine because of side effects in the past.  I encouraged him to mention this to the anesthesia team during preop.  Post-op  restrictions were discussed as well and he understands.  Discussed with him the possibility of mesh placement if the defect is particularly larger than expected, though on exam is appears to be small.  All of his questions have been answered.  He is willing to proceed.  He has preop testing appointment on Thursday.  Face-to-face time spent with the patient and care providers was 15 minutes, with more than 50% of the time spent counseling, educating, and coordinating care of the patient.     Melvyn Neth, [facility]

## 2019-06-18 ENCOUNTER — Encounter
Admission: RE | Admit: 2019-06-18 | Discharge: 2019-06-18 | Disposition: A | Discharge status: Home / Self Care | Payer: Medicare Other | Source: Ambulatory Visit | Attending: Surgery | Admitting: Surgery

## 2019-06-18 ENCOUNTER — Other Ambulatory Visit: Payer: Self-pay

## 2019-06-18 DIAGNOSIS — Z20828 Contact with and (suspected) exposure to other viral communicable diseases: Secondary | ICD-10-CM | POA: Diagnosis not present

## 2019-06-18 DIAGNOSIS — Z01812 Encounter for preprocedural laboratory examination: Secondary | ICD-10-CM | POA: Insufficient documentation

## 2019-06-18 LAB — SARS CORONAVIRUS 2 (TAT 6-24 HRS): SARS Coronavirus 2: NEGATIVE

## 2019-06-18 NOTE — Patient Instructions (Signed)
Your procedure is scheduled on: Mon.8/17 Report to Day Surgery.  [facility] To find out your arrival time please call [phone] between 1PM - 3PM on Today  Remember: Instructions that are not followed completely may result in serious medical risk,  up to and including death, or upon the discretion of your surgeon and anesthesiologist your  surgery may need to be rescheduled.     _X__ 1. Do not eat food after midnight the night before your procedure.                 No gum chewing or hard candies. You may drink clear liquids up to 2 hours                 before you are scheduled to arrive for your surgery- DO not drink clear                 liquids within 2 hours of the start of your surgery.                 Clear Liquids include:  water,  Black Coffee or Tea (Do not add                 anything to coffee or tea).  __X__2.  On the morning of surgery brush your teeth with toothpaste and water, you                may rinse your mouth with mouthwash if you wish.  Do not swallow any toothpaste of mouthwash.     _X__ 3.  No Alcohol for 24 hours before or after surgery.   ___ 4.  Do Not Smoke or use e-cigarettes For 24 Hours Prior to Your Surgery.                 Do not use any chewable tobacco products for at least 6 hours prior to                 surgery.  ____  5.  Bring all medications with you on the day of surgery if instructed.   __x__  6.  Notify your doctor if there is any change in your medical condition      (cold, fever, infections).     Do not wear jewelry, make-up, hairpins, clips or nail polish. Do not wear lotions, powders, or perfumes. You may wear deodorant. Do not shave 48 hours prior to surgery. Men may shave face and neck. Do not bring valuables to the hospital.    [facility] is not responsible for any belongings or valuables.  Contacts, dentures or bridgework may not be worn into surgery. Leave your suitcase in the car. After surgery  it may be brought to your room. For patients admitted to the hospital, discharge time is determined by your treatment team.   Patients discharged the day of surgery will not be allowed to drive home.   Please read over the following fact sheets that you were given:     _x___ Take these medicines the morning of surgery with A SIP OF WATER:    1. ALPRAZolam (XANAX) 0.5 MG tablet  2. sertraline (ZOLOFT) 100 MG tablet  3.   4.  5.  6.  ____ Fleet Enema (as directed)   _x___ Use CHG Soap as directed  _x___ Use inhalers on the day of surgeryalbuterol (VENTOLIN HFA) 108 (90 Base) MCG/ACT inhaler  Bring it with you  __x__ Stop metformin  2 days prior to surgery  Tomorrow last dose    __x__ Take 1/2 of usual insulin dose the night before surgery. No insulin the morning          of surgery.   ____ Stop Coumadin/Plavix/aspirin on   __x__ Stop Anti-inflammatories No ibuprofen aleve or aspirin     May take tylenol   ____ Stop supplements until after surgery.    ____ Bring C-Pap to the hospital.

## 2019-06-21 MED ORDER — CEFAZOLIN SODIUM-DEXTROSE 2-4 GM/100ML-% IV SOLN
2.0000 g | INTRAVENOUS | Status: AC
  Administered 2019-06-22: 2 g via INTRAVENOUS

## 2019-06-22 ENCOUNTER — Encounter: Payer: Self-pay | Admitting: *Deleted

## 2019-06-22 ENCOUNTER — Ambulatory Visit
Admission: RE | Admit: 2019-06-22 | Discharge: 2019-06-22 | Disposition: A | Discharge status: Home / Self Care | Payer: Medicare Other | Source: Ambulatory Visit | Attending: Surgery | Admitting: Surgery

## 2019-06-22 ENCOUNTER — Ambulatory Visit: Payer: Medicare Other | Admitting: Anesthesiology

## 2019-06-22 ENCOUNTER — Encounter
Admission: RE | Disposition: A | Discharge status: Home / Self Care | Payer: Self-pay | Source: Ambulatory Visit | Attending: Surgery

## 2019-06-22 ENCOUNTER — Other Ambulatory Visit: Payer: Self-pay

## 2019-06-22 DIAGNOSIS — E1122 Type 2 diabetes mellitus with diabetic chronic kidney disease: Secondary | ICD-10-CM | POA: Insufficient documentation

## 2019-06-22 DIAGNOSIS — Z79899 Other long term (current) drug therapy: Secondary | ICD-10-CM | POA: Insufficient documentation

## 2019-06-22 DIAGNOSIS — Z86711 Personal history of pulmonary embolism: Secondary | ICD-10-CM | POA: Diagnosis not present

## 2019-06-22 DIAGNOSIS — J449 Chronic obstructive pulmonary disease, unspecified: Secondary | ICD-10-CM | POA: Insufficient documentation

## 2019-06-22 DIAGNOSIS — Z794 Long term (current) use of insulin: Secondary | ICD-10-CM | POA: Diagnosis not present

## 2019-06-22 DIAGNOSIS — K429 Umbilical hernia without obstruction or gangrene: Secondary | ICD-10-CM

## 2019-06-22 DIAGNOSIS — F419 Anxiety disorder, unspecified: Secondary | ICD-10-CM | POA: Diagnosis not present

## 2019-06-22 DIAGNOSIS — F259 Schizoaffective disorder, unspecified: Secondary | ICD-10-CM | POA: Diagnosis not present

## 2019-06-22 DIAGNOSIS — F319 Bipolar disorder, unspecified: Secondary | ICD-10-CM | POA: Insufficient documentation

## 2019-06-22 DIAGNOSIS — N189 Chronic kidney disease, unspecified: Secondary | ICD-10-CM | POA: Diagnosis not present

## 2019-06-22 HISTORY — PX: UMBILICAL HERNIA REPAIR: SHX196

## 2019-06-22 LAB — URINE DRUG SCREEN, QUALITATIVE (ARMC ONLY)
Amphetamines, Ur Screen: NOT DETECTED
Barbiturates, Ur Screen: NOT DETECTED
Benzodiazepine, Ur Scrn: POSITIVE — AB
Cannabinoid 50 Ng, Ur ~~LOC~~: NOT DETECTED
Cocaine Metabolite,Ur ~~LOC~~: NOT DETECTED
MDMA (Ecstasy)Ur Screen: NOT DETECTED
Methadone Scn, Ur: NOT DETECTED
Opiate, Ur Screen: NOT DETECTED
Phencyclidine (PCP) Ur S: NOT DETECTED
Tricyclic, Ur Screen: NOT DETECTED

## 2019-06-22 LAB — GLUCOSE, CAPILLARY
Glucose-Capillary: 211 mg/dL — ABNORMAL HIGH (ref 70–99)
Glucose-Capillary: 156 mg/dL — ABNORMAL HIGH (ref 70–99)

## 2019-06-22 SURGERY — REPAIR, HERNIA, UMBILICAL, ADULT
Anesthesia: General

## 2019-06-22 MED ORDER — CELECOXIB 200 MG PO CAPS
200.0000 mg | ORAL_CAPSULE | ORAL | Status: AC
  Administered 2019-06-22: 200 mg via ORAL

## 2019-06-22 MED ORDER — ONDANSETRON HCL 4 MG/2ML IJ SOLN
INTRAMUSCULAR | Status: AC
  Filled 2019-06-22: qty 2

## 2019-06-22 MED ORDER — FENTANYL CITRATE (PF) 250 MCG/5ML IJ SOLN
INTRAMUSCULAR | Status: AC
  Filled 2019-06-22: qty 5

## 2019-06-22 MED ORDER — PROPOFOL 10 MG/ML IV BOLUS
INTRAVENOUS | Status: DC | PRN
  Administered 2019-06-22: 180 mg via INTRAVENOUS

## 2019-06-22 MED ORDER — FENTANYL CITRATE (PF) 100 MCG/2ML IJ SOLN
INTRAMUSCULAR | Status: AC
  Filled 2019-06-22: qty 2

## 2019-06-22 MED ORDER — CEFAZOLIN SODIUM-DEXTROSE 2-4 GM/100ML-% IV SOLN
INTRAVENOUS | Status: AC
  Filled 2019-06-22: qty 100

## 2019-06-22 MED ORDER — ONDANSETRON HCL 4 MG/2ML IJ SOLN
4.0000 mg | Freq: Once | INTRAMUSCULAR | Status: AC | PRN
  Administered 2019-06-22: 4 mg via INTRAVENOUS

## 2019-06-22 MED ORDER — FAMOTIDINE 20 MG PO TABS
20.0000 mg | ORAL_TABLET | Freq: Once | ORAL | Status: AC
  Administered 2019-06-22: 20 mg via ORAL

## 2019-06-22 MED ORDER — BUPIVACAINE LIPOSOME 1.3 % IJ SUSP: 20.0000 mL | Freq: Once | INTRAMUSCULAR | Status: DC

## 2019-06-22 MED ORDER — SODIUM CHLORIDE (PF) 0.9 % IJ SOLN
INTRAMUSCULAR | Status: AC
  Filled 2019-06-22: qty 10

## 2019-06-22 MED ORDER — PROPOFOL 10 MG/ML IV BOLUS
INTRAVENOUS | Status: AC
  Filled 2019-06-22: qty 20

## 2019-06-22 MED ORDER — KETOROLAC TROMETHAMINE 30 MG/ML IJ SOLN
INTRAMUSCULAR | Status: AC
  Filled 2019-06-22: qty 1

## 2019-06-22 MED ORDER — ACETAMINOPHEN 500 MG PO TABS
1000.0000 mg | ORAL_TABLET | ORAL | Status: AC
  Administered 2019-06-22: 10:00:00 1000 mg via ORAL

## 2019-06-22 MED ORDER — LACTATED RINGERS IV SOLN
INTRAVENOUS | Status: DC | PRN
  Administered 2019-06-22: 14:00:00 via INTRAVENOUS

## 2019-06-22 MED ORDER — MIDAZOLAM HCL 2 MG/2ML IJ SOLN
INTRAMUSCULAR | Status: AC
  Filled 2019-06-22: qty 2

## 2019-06-22 MED ORDER — SUCCINYLCHOLINE CHLORIDE 20 MG/ML IJ SOLN
INTRAMUSCULAR | Status: DC | PRN
  Administered 2019-06-22: 100 mg via INTRAVENOUS

## 2019-06-22 MED ORDER — ONDANSETRON HCL 4 MG/2ML IJ SOLN
INTRAMUSCULAR | Status: DC | PRN
  Administered 2019-06-22: 4 mg via INTRAVENOUS

## 2019-06-22 MED ORDER — GABAPENTIN 300 MG PO CAPS
300.0000 mg | ORAL_CAPSULE | ORAL | Status: AC
  Administered 2019-06-22: 300 mg via ORAL

## 2019-06-22 MED ORDER — HYDROMORPHONE HCL 1 MG/ML IJ SOLN
INTRAMUSCULAR | Status: AC
  Filled 2019-06-22: qty 1

## 2019-06-22 MED ORDER — HYDROMORPHONE HCL 2 MG PO TABS: 2.0000 mg | ORAL_TABLET | ORAL | 0 refills | Status: DC | PRN

## 2019-06-22 MED ORDER — ACETAMINOPHEN 500 MG PO TABS
ORAL_TABLET | ORAL | Status: AC
  Filled 2019-06-22: qty 2

## 2019-06-22 MED ORDER — BUPIVACAINE-EPINEPHRINE (PF) 0.5% -1:200000 IJ SOLN
INTRAMUSCULAR | Status: DC | PRN
  Administered 2019-06-22: 30 mL

## 2019-06-22 MED ORDER — GABAPENTIN 300 MG PO CAPS
ORAL_CAPSULE | ORAL | Status: AC
  Filled 2019-06-22: qty 1

## 2019-06-22 MED ORDER — HYDROMORPHONE HCL 1 MG/ML IJ SOLN
INTRAMUSCULAR | Status: DC | PRN
  Administered 2019-06-22: .1 mg via INTRAVENOUS
  Administered 2019-06-22: .3 mg via INTRAVENOUS
  Administered 2019-06-22: .2 mg via INTRAVENOUS
  Administered 2019-06-22: .4 mg via INTRAVENOUS

## 2019-06-22 MED ORDER — OXYCODONE HCL 5 MG/5ML PO SOLN: 5.0000 mg | Freq: Once | ORAL | Status: AC | PRN

## 2019-06-22 MED ORDER — SUGAMMADEX SODIUM 200 MG/2ML IV SOLN
INTRAVENOUS | Status: DC | PRN
  Administered 2019-06-22: 160 mg via INTRAVENOUS

## 2019-06-22 MED ORDER — IBUPROFEN 600 MG PO TABS: 600.0000 mg | ORAL_TABLET | Freq: Three times a day (TID) | ORAL | 0 refills | Status: DC | PRN

## 2019-06-22 MED ORDER — ROCURONIUM BROMIDE 50 MG/5ML IV SOLN
INTRAVENOUS | Status: AC
  Filled 2019-06-22: qty 1

## 2019-06-22 MED ORDER — SUGAMMADEX SODIUM 200 MG/2ML IV SOLN
INTRAVENOUS | Status: AC
  Filled 2019-06-22: qty 2

## 2019-06-22 MED ORDER — LIDOCAINE HCL (PF) 2 % IJ SOLN
INTRAMUSCULAR | Status: AC
  Filled 2019-06-22: qty 10

## 2019-06-22 MED ORDER — CHLORHEXIDINE GLUCONATE CLOTH 2 % EX PADS: 6.0000 | MEDICATED_PAD | Freq: Once | CUTANEOUS | Status: DC

## 2019-06-22 MED ORDER — OXYCODONE HCL 5 MG PO TABS
ORAL_TABLET | ORAL | Status: AC
  Filled 2019-06-22: qty 1

## 2019-06-22 MED ORDER — SODIUM CHLORIDE 0.9 % IV SOLN
INTRAVENOUS | Status: DC
  Administered 2019-06-22: 11:00:00 via INTRAVENOUS

## 2019-06-22 MED ORDER — KETOROLAC TROMETHAMINE 30 MG/ML IJ SOLN
INTRAMUSCULAR | Status: DC | PRN
  Administered 2019-06-22: 30 mg via INTRAVENOUS

## 2019-06-22 MED ORDER — CELECOXIB 200 MG PO CAPS
ORAL_CAPSULE | ORAL | Status: AC
  Filled 2019-06-22: qty 1

## 2019-06-22 MED ORDER — ROCURONIUM BROMIDE 100 MG/10ML IV SOLN
INTRAVENOUS | Status: DC | PRN
  Administered 2019-06-22: 20 mg via INTRAVENOUS

## 2019-06-22 MED ORDER — LIDOCAINE HCL (CARDIAC) PF 100 MG/5ML IV SOSY
PREFILLED_SYRINGE | INTRAVENOUS | Status: DC | PRN
  Administered 2019-06-22: 100 mg via INTRAVENOUS

## 2019-06-22 MED ORDER — BUPIVACAINE-EPINEPHRINE (PF) 0.5% -1:200000 IJ SOLN
INTRAMUSCULAR | Status: AC
  Filled 2019-06-22: qty 30

## 2019-06-22 MED ORDER — SUCCINYLCHOLINE CHLORIDE 20 MG/ML IJ SOLN
INTRAMUSCULAR | Status: AC
  Filled 2019-06-22: qty 1

## 2019-06-22 MED ORDER — FAMOTIDINE 20 MG PO TABS
ORAL_TABLET | ORAL | Status: AC
  Filled 2019-06-22: qty 1

## 2019-06-22 MED ORDER — OXYCODONE HCL 5 MG PO TABS
5.0000 mg | ORAL_TABLET | Freq: Once | ORAL | Status: AC | PRN
  Administered 2019-06-22: 5 mg via ORAL

## 2019-06-22 SURGICAL SUPPLY — 30 items
ADH SKN CLS APL DERMABOND .7 (GAUZE/BANDAGES/DRESSINGS) ×1
APL PRP STRL LF DISP 70% ISPRP (MISCELLANEOUS) ×1
BLADE SURG 15 STRL LF DISP TIS (BLADE) ×1 IMPLANT
BLADE SURG 15 STRL SS (BLADE) ×2
CANISTER SUCT 1200ML W/VALVE (MISCELLANEOUS) ×2 IMPLANT
CHLORAPREP W/TINT 26 (MISCELLANEOUS) ×2 IMPLANT
COVER WAND RF STERILE (DRAPES) ×2 IMPLANT
DERMABOND ADVANCED (GAUZE/BANDAGES/DRESSINGS) ×1
DERMABOND ADVANCED .7 DNX12 (GAUZE/BANDAGES/DRESSINGS) ×1 IMPLANT
DRAPE LAPAROTOMY 77X122 PED (DRAPES) ×2 IMPLANT
ELECT REM PT RETURN 9FT ADLT (ELECTROSURGICAL) ×2
ELECTRODE REM PT RTRN 9FT ADLT (ELECTROSURGICAL) ×1 IMPLANT
GLOVE SURG SYN 7.0 (GLOVE) ×4 IMPLANT
GLOVE SURG SYN 7.0 PF PI (GLOVE) ×1 IMPLANT
GLOVE SURG SYN 7.5  E (GLOVE) ×2
GLOVE SURG SYN 7.5 E (GLOVE) ×2 IMPLANT
GLOVE SURG SYN 7.5 PF PI (GLOVE) ×1 IMPLANT
GOWN STRL REUS W/ TWL LRG LVL3 (GOWN DISPOSABLE) ×3 IMPLANT
GOWN STRL REUS W/TWL LRG LVL3 (GOWN DISPOSABLE) ×6
NEEDLE HYPO 22GX1.5 SAFETY (NEEDLE) ×2 IMPLANT
NS IRRIG 500ML POUR BTL (IV SOLUTION) ×2 IMPLANT
PACK BASIN MINOR ARMC (MISCELLANEOUS) ×2 IMPLANT
SUT ETHIBOND 0 MO6 C/R (SUTURE) ×2 IMPLANT
SUT MNCRL AB 4-0 PS2 18 (SUTURE) ×2 IMPLANT
SUT VIC AB 2-0 SH 27 (SUTURE) ×2
SUT VIC AB 2-0 SH 27XBRD (SUTURE) ×1 IMPLANT
SUT VIC AB 3-0 SH 27 (SUTURE) ×2
SUT VIC AB 3-0 SH 27X BRD (SUTURE) ×1 IMPLANT
SYR 20ML LL LF (SYRINGE) ×2 IMPLANT
SYR BULB IRRIG 60ML STRL (SYRINGE) ×2 IMPLANT

## 2019-06-22 NOTE — Anesthesia Procedure Notes (Signed)
Procedure Name: Intubation Date/Time: 06/22/2019 1:36 PM Performed by: Rona Ravens, CRNA Pre-anesthesia Checklist: Patient identified, Emergency Drugs available, Suction available, Patient being monitored and Timeout performed Patient Re-evaluated:Patient Re-evaluated prior to induction Oxygen Delivery Method: Circle system utilized Preoxygenation: Pre-oxygenation with 100% oxygen Induction Type: IV induction and Rapid sequence Laryngoscope Size: Mac and 4 Grade View: Grade II Tube type: Oral Tube size: 7.5 mm Number of attempts: 1 Airway Equipment and Method: Stylet Placement Confirmation: ETT inserted through vocal cords under direct vision,  positive ETCO2,  CO2 detector and breath sounds checked- equal and bilateral Secured at: 22 cm Tube secured with: Tape Dental Injury: Teeth and Oropharynx as per pre-operative assessment

## 2019-06-22 NOTE — Progress Notes (Signed)
zofran given for nausea  

## 2019-06-22 NOTE — Interval H&P Note (Signed)
History and Physical Interval Note:  06/22/2019 9:55 AM  Shaun Odonnell  has presented today for surgery, with the diagnosis of UMBILICAL HERNIA.  The various methods of treatment have been discussed with the patient and family. After consideration of risks, benefits and other options for treatment, the patient has consented to  Procedure(s): OPEN HERNIA REPAIR UMBILICAL ADULT (N/A) as a surgical intervention.  The patient's history has been reviewed, patient examined, no change in status, stable for surgery.  I have reviewed the patient's chart and labs.  Questions were answered to the patient's satisfaction.     Gracious Renken

## 2019-06-22 NOTE — Progress Notes (Signed)
Nausea better.

## 2019-06-22 NOTE — Progress Notes (Signed)
Heart rate 48 to 53    Dr piscitelo aware no new orders

## 2019-06-22 NOTE — Op Note (Signed)
Procedure Date:  06/22/2019  Pre-operative Diagnosis:  Umbilical hernia  Post-operative Diagnosis:  Umbilical hernia  Procedure:  Umbilical hernia repair  Surgeon:  Melvyn Neth, MD  Anesthesia:  General endotracheal  Estimated Blood Loss:  5 ml  Specimens:  Hernia sac  Complications:  None  Indications for Procedure:  This is a 53 y.o. male who presents with a symptomatic umbilical hernia.  The risks of bleeding, abscess or infection, injury to surrounding structures, and need for further procedures were all discussed with the patient and was willing to proceed.  Description of Procedure: The patient was correctly identified in the preoperative area and brought into the operating room.  The patient was placed supine with VTE prophylaxis in place.  Appropriate time-outs were performed.  Anesthesia was induced and the patient was intubated.  Appropriate antibiotics were infused.  The abdomen was prepped and draped in a sterile fashion.  A 4 cm infraumbilical incision was made and cautery was used to dissect down the subcutaneous tissue along the umbilical stalk.  Kelly forceps were used to dissect the umbilical stalk and it was divided at the fascia using cautery.  This allowed visualization of the hernia defect.  The hernia was reduced without complications.  Hernia sac was resected.  The fascial edges were cleaned using cautery.  This caused two small openings in the anterior sheath of rectus muscle.  These were closed with 0 Ethibond sutures.  The hernia defect was then closed with three 0 Ethibond sutures.  The umbilical stalk was then reattached to the fascia using 2-0 Vicryl. The wound was irrigated and local anesthetic was infused.  The wound was then closed in layers using 3-0 Vicryl and 4-0 Monocryl. The incision was cleaned and sealed with DermaBond.  The patient was emerged from anesthesia and extubated and brought to the recovery room for further management.  The patient  tolerated the procedure well and all counts were correct at the end of the case.   Melvyn Neth, MD

## 2019-06-22 NOTE — Transfer of Care (Signed)
Immediate Anesthesia Transfer of Care Note  Patient: Shaun Odonnell  Procedure(s) Performed: OPEN HERNIA REPAIR UMBILICAL ADULT (N/A )  Patient Location: PACU  Anesthesia Type:General  Level of Consciousness: awake, alert  and oriented  Airway & Oxygen Therapy: Patient Spontanous Breathing and Patient connected to face mask oxygen  Post-op Assessment: Report given to RN and Post -op Vital signs reviewed and stable  Post vital signs: Reviewed and stable  Last Vitals:  Vitals Value Taken Time  BP 141/85 06/22/19 1511  Temp 36.7 C 06/22/19 1511  Pulse 89 06/22/19 1514  Resp 15 06/22/19 1514  SpO2 99 % 06/22/19 1514  Vitals shown include unvalidated device data.  Last Pain:  Vitals:   06/22/19 0952  TempSrc: Tympanic  PainSc: 4          Complications: No apparent anesthesia complications

## 2019-06-22 NOTE — Discharge Instructions (Signed)

## 2019-06-22 NOTE — Anesthesia Preprocedure Evaluation (Signed)
Anesthesia Evaluation  Patient identified by MRN, date of birth, ID band Patient awake    Reviewed: Allergy & Precautions, NPO status , Patient's Chart, lab work & pertinent test results  History of Anesthesia Complications Negative for: history of anesthetic complications  Airway Mallampati: II  TM Distance: >3 FB Neck ROM: Full    Dental no notable dental hx.    Pulmonary neg sleep apnea, COPD,  COPD inhaler,    breath sounds clear to auscultation- rhonchi (-) wheezing      Cardiovascular Exercise Tolerance: Good (-) hypertension(-) CAD, (-) Past MI, (-) Cardiac Stents and (-) CABG  Rhythm:Regular Rate:Normal - Systolic murmurs and - Diastolic murmurs    Neuro/Psych neg Seizures PSYCHIATRIC DISORDERS Anxiety Depression Bipolar Disorder Schizophrenia negative neurological ROS     GI/Hepatic negative GI ROS, Neg liver ROS,   Endo/Other  diabetes, Insulin Dependent  Renal/GU Renal InsufficiencyRenal disease     Musculoskeletal negative musculoskeletal ROS (+)   Abdominal (+) - obese,   Peds  Hematology negative hematology ROS (+)   Anesthesia Other Findings Past Medical History: 04/29/2019: Alcohol abuse No date: Allergy No date: Anxiety No date: ARDS (adult respiratory distress syndrome) (HCC) No date: ARDS (adult respiratory distress syndrome) (HCC) No date: Bipolar disorder (Otho) No date: Borderline diabetes No date: Cataract No date: Chronic kidney disease No date: COPD (chronic obstructive pulmonary disease) (HCC)     Comment:  chronic cough and wheezing No date: Depression No date: Diabetes mellitus without complication (HCC) No date: Dizziness     Comment:  medication related No date: Dyspnea     Comment:  with exertion No date: Elevated lipids No date: Fatty liver No date: Hypercholesterolemia No date: Pancreatitis No date: Pneumonia     Comment:  in past No date: Pneumonia     Comment:  in  past No date: Pre-diabetes     Comment:  diet controlled No date: Pulmonary embolism (HCC) No date: Schizoaffective disorder (HCC)   Reproductive/Obstetrics                             Anesthesia Physical Anesthesia Plan  ASA: II  Anesthesia Plan: General   Post-op Pain Management:    Induction: Intravenous  PONV Risk Score and Plan: 0 and Ondansetron and Midazolam  Airway Management Planned: Oral ETT  Additional Equipment:   Intra-op Plan:   Post-operative Plan: Extubation in OR  Informed Consent: I have reviewed the patients History and Physical, chart, labs and discussed the procedure including the risks, benefits and alternatives for the proposed anesthesia with the patient or authorized representative who has indicated his/her understanding and acceptance.     Dental advisory given  Plan Discussed with: CRNA and Anesthesiologist  Anesthesia Plan Comments:         Anesthesia Quick Evaluation

## 2019-06-22 NOTE — Anesthesia Post-op Follow-up Note (Signed)
Anesthesia QCDR form completed.        

## 2019-06-23 ENCOUNTER — Telehealth: Payer: Self-pay | Admitting: Surgery

## 2019-06-23 NOTE — Anesthesia Postprocedure Evaluation (Signed)
Anesthesia Post Note  Patient: ARKEEM HARTS  Procedure(s) Performed: OPEN HERNIA REPAIR UMBILICAL ADULT (N/A )  Patient location during evaluation: PACU Anesthesia Type: General Level of consciousness: awake and alert and oriented Pain management: pain level controlled Vital Signs Assessment: post-procedure vital signs reviewed and stable Respiratory status: spontaneous breathing, nonlabored ventilation and respiratory function stable Cardiovascular status: blood pressure returned to baseline and stable Postop Assessment: no signs of nausea or vomiting Anesthetic complications: no     Last Vitals:  Vitals:   06/22/19 1630 06/22/19 1656  BP:  120/64  Pulse: (!) 57 (!) 54  Resp: 16 16  Temp: 36.4 C   SpO2: (!) 2% 97%    Last Pain:  Vitals:   06/22/19 1656  TempSrc:   PainSc: 2                  Clydell Alberts

## 2019-06-23 NOTE — Telephone Encounter (Signed)
Patient is aware he can use heat to the area after 48 hours and can use ibuprofen in between pain medication, and to call the office if any concerns. Per Janett Billow

## 2019-06-24 ENCOUNTER — Telehealth: Payer: Self-pay | Admitting: Surgery

## 2019-06-24 NOTE — Telephone Encounter (Signed)
Patient was instructed to take 4 mg Dilaudid on hand and that no new prescription would be written at this time. He was instructed to take the ibuprofen as well. Patient verbalized understanding.

## 2019-06-24 NOTE — Telephone Encounter (Signed)
Patient is calling again asking if he can have a higher dosage of the pain medicine patent said he has been using the heat to it like we said and switched back and forth between the medicine. Please call patient and advise.

## 2019-06-24 NOTE — Telephone Encounter (Signed)
  Telephone Triage Questions   Name / Date of surgery? Physician?     Umbilical hernia repair, [place], 06/22/19  Pain ? Where and how severe, (1-10) with exertion 7-8, sitting ok,   Nausea, vomiting, Fever, chills? No      any Bright redness around the wound/ area? no  Constipation / Diarrhea? no Last Bowel movement? 06/23/19-normal  Current Antibiotics or Narcotics? Dilaudid 2 mg Have you tried Ibuprofen/Tylenol combination for your pain? Yes not helping   Patient is requesting 4 mg Dilaudid.  Patient instructed to move about as much as possible, normal to have some pain and discomfort. Patient stated he has tried tylenol and ibuprofen. However its not helping and he is requesting dilaudid 4 mg.

## 2019-06-25 ENCOUNTER — Other Ambulatory Visit: Payer: Self-pay | Admitting: Physician Assistant

## 2019-06-26 ENCOUNTER — Telehealth: Payer: Self-pay | Admitting: Surgery

## 2019-06-26 ENCOUNTER — Other Ambulatory Visit: Payer: Self-pay

## 2019-06-26 LAB — SURGICAL PATHOLOGY

## 2019-06-26 MED ORDER — HYDROMORPHONE HCL 2 MG PO TABS: 2.0000 mg | ORAL_TABLET | Freq: Two times a day (BID) | ORAL | 0 refills | Status: DC | PRN

## 2019-06-26 NOTE — Telephone Encounter (Signed)
Telephone Triage Questions    Date?       06/22/2019                  Physician?     Dr. Hampton Abbot   Pain ? 7, incision site, and around incision, as well to the right & left   Where and how severe, (1-10)   Nausea, vomiting, Fever, chills? Nausea   Any Bright redness around the wound/ area?  Little redness    Current Antibiotics or Narcotics?  No antibotics, ibuprofen 600mg , along with the pain medcine   Patient is asking for a refill on his pain medication due to the weekend coming up and he will be out before then. Please call patient and advise.

## 2019-06-26 NOTE — Telephone Encounter (Signed)
Patient notified of prescription to pick up at the office.

## 2019-06-26 NOTE — Telephone Encounter (Signed)
Patient said if he is able to get the medicine refilled he will have to have a ride and will need to know as soon as possible.

## 2019-06-30 ENCOUNTER — Encounter: Payer: Self-pay | Admitting: Physician Assistant

## 2019-06-30 ENCOUNTER — Other Ambulatory Visit: Payer: Self-pay

## 2019-06-30 ENCOUNTER — Emergency Department
Admission: EM | Admit: 2019-06-30 | Discharge: 2019-06-30 | Disposition: A | Discharge status: Home / Self Care | Payer: Medicare Other | Attending: Student | Admitting: Student

## 2019-06-30 ENCOUNTER — Ambulatory Visit (INDEPENDENT_AMBULATORY_CARE_PROVIDER_SITE_OTHER): Payer: Medicare Other | Admitting: Physician Assistant

## 2019-06-30 ENCOUNTER — Encounter: Payer: Self-pay | Admitting: Emergency Medicine

## 2019-06-30 ENCOUNTER — Emergency Department: Payer: Medicare Other

## 2019-06-30 VITALS — BP 90/59 | HR 62 | Temp 97.1°F | Resp 16 | Wt 175.8 lb

## 2019-06-30 DIAGNOSIS — R51 Headache: Secondary | ICD-10-CM

## 2019-06-30 DIAGNOSIS — E1165 Type 2 diabetes mellitus with hyperglycemia: Secondary | ICD-10-CM | POA: Diagnosis not present

## 2019-06-30 DIAGNOSIS — R300 Dysuria: Secondary | ICD-10-CM | POA: Diagnosis not present

## 2019-06-30 DIAGNOSIS — R0789 Other chest pain: Secondary | ICD-10-CM | POA: Diagnosis not present

## 2019-06-30 DIAGNOSIS — Z79899 Other long term (current) drug therapy: Secondary | ICD-10-CM | POA: Insufficient documentation

## 2019-06-30 DIAGNOSIS — E782 Mixed hyperlipidemia: Secondary | ICD-10-CM | POA: Insufficient documentation

## 2019-06-30 DIAGNOSIS — N189 Chronic kidney disease, unspecified: Secondary | ICD-10-CM | POA: Diagnosis not present

## 2019-06-30 DIAGNOSIS — I129 Hypertensive chronic kidney disease with stage 1 through stage 4 chronic kidney disease, or unspecified chronic kidney disease: Secondary | ICD-10-CM | POA: Insufficient documentation

## 2019-06-30 DIAGNOSIS — R42 Dizziness and giddiness: Secondary | ICD-10-CM | POA: Diagnosis present

## 2019-06-30 DIAGNOSIS — I959 Hypotension, unspecified: Secondary | ICD-10-CM | POA: Insufficient documentation

## 2019-06-30 DIAGNOSIS — Z885 Allergy status to narcotic agent status: Secondary | ICD-10-CM | POA: Diagnosis not present

## 2019-06-30 DIAGNOSIS — R634 Abnormal weight loss: Secondary | ICD-10-CM

## 2019-06-30 DIAGNOSIS — R519 Headache, unspecified: Secondary | ICD-10-CM

## 2019-06-30 DIAGNOSIS — E1122 Type 2 diabetes mellitus with diabetic chronic kidney disease: Secondary | ICD-10-CM | POA: Diagnosis not present

## 2019-06-30 DIAGNOSIS — J449 Chronic obstructive pulmonary disease, unspecified: Secondary | ICD-10-CM | POA: Insufficient documentation

## 2019-06-30 DIAGNOSIS — Z888 Allergy status to other drugs, medicaments and biological substances status: Secondary | ICD-10-CM | POA: Insufficient documentation

## 2019-06-30 DIAGNOSIS — Z794 Long term (current) use of insulin: Secondary | ICD-10-CM | POA: Insufficient documentation

## 2019-06-30 DIAGNOSIS — F17210 Nicotine dependence, cigarettes, uncomplicated: Secondary | ICD-10-CM | POA: Insufficient documentation

## 2019-06-30 DIAGNOSIS — R031 Nonspecific low blood-pressure reading: Secondary | ICD-10-CM

## 2019-06-30 LAB — COMPREHENSIVE METABOLIC PANEL
ALT: 16 U/L (ref 0–44)
AST: 15 U/L (ref 15–41)
Albumin: 4 g/dL (ref 3.5–5.0)
Alkaline Phosphatase: 82 U/L (ref 38–126)
Anion gap: 12 (ref 5–15)
BUN: 7 mg/dL (ref 6–20)
CO2: 24 mmol/L (ref 22–32)
Calcium: 9.3 mg/dL (ref 8.9–10.3)
Chloride: 101 mmol/L (ref 98–111)
Creatinine, Ser: 0.55 mg/dL — ABNORMAL LOW (ref 0.61–1.24)
GFR calc Af Amer: >60 mL/min (ref >60)
GFR calc non Af Amer: >60 mL/min (ref >60)
Glucose, Bld: 151 mg/dL — ABNORMAL HIGH (ref 70–99)
Potassium: 3.9 mmol/L (ref 3.5–5.1)
Sodium: 137 mmol/L (ref 135–145)
Total Bilirubin: 0.5 mg/dL (ref 0.3–1.2)
Total Protein: 6.8 g/dL (ref 6.5–8.1)

## 2019-06-30 LAB — URINALYSIS, COMPLETE (UACMP) WITH MICROSCOPIC
Bacteria, UA: NONE SEEN
Bilirubin Urine: NEGATIVE
Glucose, UA: >=500 mg/dL — AB
Hgb urine dipstick: NEGATIVE
Ketones, ur: NEGATIVE mg/dL
Leukocytes,Ua: NEGATIVE
Nitrite: NEGATIVE
Protein, ur: NEGATIVE mg/dL
Specific Gravity, Urine: 1.026 (ref 1.005–1.030)
pH: 6 (ref 5.0–8.0)

## 2019-06-30 LAB — POCT GLYCOSYLATED HEMOGLOBIN (HGB A1C)
Est. average glucose Bld gHb Est-mCnc: 214
Hemoglobin A1C: 9.1 % — AB (ref 4.0–5.6)

## 2019-06-30 LAB — POCT URINALYSIS DIPSTICK
Bilirubin, UA: NEGATIVE
Blood, UA: NEGATIVE
Glucose, UA: POSITIVE — AB
Ketones, UA: NEGATIVE
Leukocytes, UA: NEGATIVE
Nitrite, UA: NEGATIVE
Protein, UA: NEGATIVE
Spec Grav, UA: 1.01 (ref 1.010–1.025)
Urobilinogen, UA: 0.2 E.U./dL
pH, UA: 7 (ref 5.0–8.0)

## 2019-06-30 LAB — CBC
HCT: 46.8 % (ref 39.0–52.0)
Hemoglobin: 15.6 g/dL (ref 13.0–17.0)
MCH: 33.1 pg (ref 26.0–34.0)
MCHC: 33.3 g/dL (ref 30.0–36.0)
MCV: 99.2 fL (ref 80.0–100.0)
Platelets: 208 10*3/uL (ref 150–400)
RBC: 4.72 MIL/uL (ref 4.22–5.81)
RDW: 12.8 % (ref 11.5–15.5)
WBC: 7.6 10*3/uL (ref 4.0–10.5)
nRBC: 0 % (ref 0.0–0.2)

## 2019-06-30 LAB — GLUCOSE, CAPILLARY: Glucose-Capillary: 134 mg/dL — ABNORMAL HIGH (ref 70–99)

## 2019-06-30 LAB — TROPONIN I (HIGH SENSITIVITY): Troponin I (High Sensitivity): <2 ng/L (ref <18)

## 2019-06-30 NOTE — ED Notes (Signed)

## 2019-06-30 NOTE — ED Triage Notes (Addendum)
Pt started couple days ago with nausea, chest tightness, and weakness.  Pt was at PCP today and told his BP was low and to come to ED.  VS WNL at this time.  Pt denies pain in chest during triage.  Pt alert and oriented in triage.  No vomiting.  Did have hernia surgery last week. No fevers or abdominal pain.  Has had some dizziness as well.

## 2019-06-30 NOTE — Discharge Instructions (Addendum)
Thank you for letting us take care of you in the emergency department today.   Please continue to take your regular, prescribed medications. Please be sure to stay well hydrated.  Please follow up with: - Your primary care doctor to review your ER visit and follow up on your symptoms.   Please return to the ER for any new or worsening symptoms.

## 2019-06-30 NOTE — Progress Notes (Signed)
Patient: Shaun Odonnell Male    DOB: 07-May-1966   53 y.o.   MRN: [id number] Visit Date: 06/30/2019  Today's Provider: Trinna Post, PA-C   Chief Complaint  Patient presents with  . Diabetes   Subjective:     HPI   Diabetes Mellitus Type II, Follow-up:   Lab Results  Component Value Date   HGBA1C 11.7 (H) 04/29/2019   HGBA1C 14.0 (A) 04/29/2019   HGBA1C 7.5 (H) 12/31/2018   Last seen for diabetes 1 months ago.  Management since then includes;started metformin 500 mg and titrate to maximum dose to help with hyperglycemia and also to sensitize to insulin. May be able to discontinue insulin if dietary habits improve.  Marland Kitchen He reports excellent compliance with treatment. He is having side effects. tired Current symptoms include none and have been stable. Home blood sugar records: fasting range: 140's  Episodes of hypoglycemia? no   Current Insulin Regimen: 10 units at bed time Most Recent Eye Exam: UTD Weight trend: stable Prior visit with dietician: no Current diet: in general, a "healthy" diet   Current exercise: none  A1c today is 9.1%, down from 11/7%. Fasting sugars 120's  Hypotension: Checked again 87/61. Feels slightly light headed. Slightly out of it and like he might pass out. He is s/p open abdominal hernia repair on 06/22/2019 by Dr. Hampton Abbot. He had estimated 53mL blood loss during the operation and did not stay overnight in the hospital. He is not having any blood in his stool or vomiting. He reports he is eating and drinking normally.   BP Readings from Last 3 Encounters:  06/30/19 (!) 90/59  06/22/19 120/64  06/18/19 121/71    Lab Results  Component Value Date   HGBA1C 11.7 (H) 04/29/2019   Wt Readings from Last 3 Encounters:  06/30/19 175 lb 12.8 oz (79.7 kg)  06/22/19 175 lb 8 oz (79.6 kg)  06/18/19 175 lb 8 oz (79.6 kg)   Reports he has had weight loss and wants to know why this has happened. He weight 215 lbs in 2018 and notes he lost  weight when started on jardiance.   Headache two times per week - middle of the day Pressure behind eyes, no nausea or vomiting. No light or sound sensitivity. Feels like he can feel his heart beating in his ears.  ------------------------------------------------------------------------   Allergies  Allergen Reactions  . Morphine And Related Shortness Of Breath  . Klonopin [Clonazepam] Other (See Comments)    Dystonic Reaction    . Pimozide Other (See Comments)    Dystonic Reaction   . Stelazine [Trifluoperazine] Other (See Comments)    Dystonic Reaction  . Fentanyl Nausea And Vomiting     Current Outpatient Medications:  .  albuterol (VENTOLIN HFA) 108 (90 Base) MCG/ACT inhaler, Inhale 2 puffs into the lungs every 6 (six) hours as needed., Disp: 8 g, Rfl: 3 .  ALPRAZolam (XANAX) 0.5 MG tablet, Take 0.5 mg by mouth 4 (four) times daily. Patient takes when wakes up(~0600)/ 1000/ 1600/ 2000, Disp: , Rfl:  .  empagliflozin (JARDIANCE) 25 MG TABS tablet, Take 25 mg by mouth daily., Disp: , Rfl:  .  glucose blood test strip, Use as instructed, Disp: 100 each, Rfl: 12 .  ibuprofen (ADVIL) 600 MG tablet, Take 1 tablet (600 mg total) by mouth every 8 (eight) hours as needed., Disp: 30 tablet, Rfl: 0 .  Insulin Glargine (LANTUS) 100 UNIT/ML Solostar Pen, Inject 10 Units into  the skin at bedtime. , Disp: , Rfl:  .  metFORMIN (GLUCOPHAGE-XR) 500 MG 24 hr tablet, Take 2 tablets (1,000 mg total) by mouth 2 (two) times daily., Disp: 360 tablet, Rfl: 1 .  OLANZapine (ZYPREXA) 15 MG tablet, Take 15 mg by mouth daily at 8 pm. At 2000, Disp: , Rfl:  .  ondansetron (ZOFRAN) 8 MG tablet, Take 1 tablet (8 mg total) by mouth every 8 (eight) hours as needed for nausea or vomiting., Disp: 20 tablet, Rfl: 0 .  OneTouch Delica Lancets 99991111 MISC, Use to check fasting sugar once daily., Disp: 100 each, Rfl: 1 .  sertraline (ZOLOFT) 100 MG tablet, Take 100 mg by mouth daily with breakfast. , Disp: , Rfl:  .   simvastatin (ZOCOR) 40 MG tablet, Take 1 tablet (40 mg total) by mouth daily. Takes At 1600, Disp: 7 tablet, Rfl: 0 .  UNIFINE PENTIPS 32G X 4 MM MISC, USE WITH INSULIN ONCE DAILY, Disp: 30 each, Rfl: 5 .  HYDROmorphone (DILAUDID) 2 MG tablet, Take 1 tablet (2 mg total) by mouth every 4 (four) hours as needed for severe pain. (Patient not taking: Reported on 06/30/2019), Disp: 30 tablet, Rfl: 0 .  HYDROmorphone (DILAUDID) 2 MG tablet, Take 1 tablet (2 mg total) by mouth every 12 (twelve) hours as needed for severe pain. (Patient not taking: Reported on 06/30/2019), Disp: 30 tablet, Rfl: 0  Review of Systems  Constitutional: Negative for appetite change, chills and fever.  Eyes: Negative.   Respiratory: Negative for chest tightness, shortness of breath and wheezing.   Cardiovascular: Negative for chest pain and palpitations.  Gastrointestinal: Negative for abdominal pain, nausea and vomiting.  Endocrine: Negative.     Social History   Tobacco Use  . Smoking status: Current Every Day Smoker    Packs/day: 1.00    Years: 30.00    Pack years: 30.00    Types: Cigarettes  . Smokeless tobacco: Former Systems developer    Types: Snuff  Substance Use Topics  . Alcohol use: Yes      Objective:   BP (!) 90/59 (BP Location: Left Arm, Patient Position: Sitting, Cuff Size: Large)   Pulse 62   Temp (!) 97.1 F (36.2 C) (Temporal)   Resp 16   Wt 175 lb 12.8 oz (79.7 kg)   BMI 26.73 kg/m  Vitals:   06/30/19 1331  BP: (!) 90/59  Pulse: 62  Resp: 16  Temp: (!) 97.1 F (36.2 C)  TempSrc: Temporal  Weight: 175 lb 12.8 oz (79.7 kg)     Physical Exam Constitutional:      Appearance: Normal appearance.  Cardiovascular:     Rate and Rhythm: Normal rate and regular rhythm.     Pulses: Normal pulses.     Heart sounds: Normal heart sounds.  Pulmonary:     Effort: Pulmonary effort is normal.     Breath sounds: Normal breath sounds.  Abdominal:     General: Abdomen is flat. Bowel sounds are normal.  There is no distension.     Palpations: Abdomen is soft. There is no mass.     Tenderness: There is no abdominal tenderness. There is no guarding or rebound.  Skin:    General: Skin is warm and dry.  Neurological:     Mental Status: He is alert and oriented to person, place, and time. Mental status is at baseline.  Psychiatric:        Mood and Affect: Mood normal.  Behavior: Behavior normal.      No results found for any visits on 06/30/19.     Assessment & Plan    1. Type 2 diabetes mellitus with hyperglycemia, without long-term current use of insulin (HCC)  His A1c has improved to 9.1% from 11.7%. His fasting sugars are running in the 120's the past week. This has improved significantly and his fastings are in range so I will keep his medications the same and we will follow up pending his lawork.   - POCT glycosylated hemoglobin (Hb A1C) - Comprehensive Metabolic Panel (CMET) - CBC with Differential  2. Hypotension, unspecified hypotension type  Hypotensive today but otherwise looks well in the exam room. No evidence of blood loss but I will get labs as below to check if he is volume depleted or anemic. [facility]. He has been instructed to approach the medical mall entrance, he will be screened and then directed to the phlebotomy lab. Will call when results are ready.   - Comprehensive Metabolic Panel (CMET) - CBC with Differential  3. Dysuria  - POCT urinalysis dipstick - Urine Culture  4. Weight Loss  Patient began losing weight when he was placed on jardiance. Explained that this is a known side effect of this medication. Additionally, his diabetes was very uncontrolled upon initial presentation to this clinic and that may have contributed to his weight loss as well.   5. Headaches  Reports he gets two headaches a week that are consistent with tension headaches. Explained there are numerous causes of this. He asks if this is due  to jardiance which I do not think is the case.   The entirety of the information documented in the History of Present Illness, Review of Systems and Physical Exam were personally obtained by me. Portions of this information were initially documented by Lynford Humphrey, CMA and reviewed by me for thoroughness and accuracy.   I have spent 25 minutes with this patient, >50% of which was spent on counseling and coordination of care.    Trinna Post, PA-C  [facility]

## 2019-06-30 NOTE — Patient Instructions (Addendum)
Hypotension °As your heart beats, it forces blood through your body. This force is called blood pressure. If you have hypotension, you have low blood pressure. When your blood pressure is too low, you may not get enough blood to your brain or other parts of your body. This may cause you to feel weak, light-headed, have a fast heartbeat, or even pass out (faint). Low blood pressure may be harmless, or it may cause serious problems. °What are the causes? °· Blood loss. °· Not enough water in the body (dehydration). °· Heart problems. °· Hormone problems. °· Pregnancy. °· A very bad infection. °· Not having enough of certain nutrients. °· Very bad allergic reactions. °· Certain medicines. °What increases the risk? °· Age. The risk increases as you get older. °· Conditions that affect the heart or the brain and spinal cord (central nervous system). °· Taking certain medicines. °· Being pregnant. °What are the signs or symptoms? °· Feeling: °? Weak. °? Light-headed. °? Dizzy. °? Tired (fatigued). °· Blurred vision. °· Fast heartbeat. °· Passing out, in very bad cases. °How is this treated? °· Changing your diet. This may involve eating more salt (sodium) or drinking more water. °· Taking medicines to raise your blood pressure. °· Changing how much you take (the dosage) of some of your medicines. °· Wearing compression stockings. These stockings help to prevent blood clots and reduce swelling in your legs. °In some cases, you may need to go to the hospital for: °· Fluid replacement. This means you will receive fluids through an IV tube. °· Blood replacement. This means you will receive donated blood through an IV tube (transfusion). °· Treating an infection or heart problems, if this applies. °· Monitoring. You may need to be monitored while medicines that you are taking wear off. °Follow these instructions at home: °Eating and drinking ° °· Drink enough fluids to keep your pee (urine) pale yellow. °· Eat a healthy diet.  Follow instructions from your doctor about what you can eat or drink. A healthy diet includes: °? Fresh fruits and vegetables. °? Whole grains. °? Low-fat (lean) meats. °? Low-fat dairy products. °· Eat extra salt only as told. Do not add extra salt to your diet unless your doctor tells you to. °· Eat small meals often. °· Avoid standing up quickly after you eat. °Medicines °· Take over-the-counter and prescription medicines only as told by your doctor. °? Follow instructions from your doctor about changing how much you take of your medicines, if this applies. °? Do not stop or change any of your medicines on your own. °General instructions ° °· Wear compression stockings as told by your doctor. °· Get up slowly from lying down or sitting. °· Avoid hot showers and a lot of heat as told by your doctor. °· Return to your normal activities as told by your doctor. Ask what activities are safe for you. °· Do not use any products that contain nicotine or tobacco, such as cigarettes, e-cigarettes, and chewing tobacco. If you need help quitting, ask your doctor. °· Keep all follow-up visits as told by your doctor. This is important. °Contact a doctor if: °· You throw up (vomit). °· You have watery poop (diarrhea). °· You have a fever for more than 2-3 days. °· You feel more thirsty than normal. °· You feel weak and tired. °Get help right away if: °· You have chest pain. °· You have a fast or uneven heartbeat. °· You lose feeling (have numbness) in any   part of your body. °· You cannot move your arms or your legs. °· You have trouble talking. °· You get sweaty or feel light-headed. °· You pass out. °· You have trouble breathing. °· You have trouble staying awake. °· You feel mixed up (confused). °Summary °· Hypotension is also called low blood pressure. It is when the force of blood pumping through your arteries is too weak. °· Hypotension may be harmless, or it may cause serious problems. °· Treatment may include changing  your diet and medicines, and wearing compression stockings. °· In very bad cases, you may need to go to the hospital. °This information is not intended to replace advice given to you by your health care provider. Make sure you discuss any questions you have with your health care provider. °Document Released: 01/16/2010 Document Revised: 04/17/2018 Document Reviewed: 04/17/2018 °Elsevier Patient Education © 2020 [employer] ° °

## 2019-06-30 NOTE — ED Provider Notes (Addendum)
[facility] Emergency Department Provider Note  ____________________________________________   First MD Initiated Contact with Patient 06/30/19 1657     (approximate)  I have reviewed the triage vital signs and the nursing notes.  History  Chief Complaint Dizziness and Chest Pain    HPI Shaun Odonnell is a 53 y.o. male with a history of alcohol use, COPD, s/p umbilical hernia repair on 06/22/19, who presents to the ED from clinic for an episode of low blood pressure in the clinic.  Patient states he is asymptomatic and was surprised when he was told his blood pressure was low.  He reports since his surgery he has been eating and drinking well, tolerating PO, having normal bowel movements. No vomiting or diarrhea. He reports a vague chest discomfort which he cannot characterize further, but states this is chronic in nature (for at least over a year) and is unchanged today.  He denies any shortness of breath, palpitations, syncope.  No fevers.  He reports some malodorous urine but no dysuria.  He denies any leg swelling.  He specifically denies any history of DVT or PE.         Past Medical Hx Past Medical History:  Diagnosis Date  . Alcohol abuse 04/29/2019  . Allergy   . Anxiety   . ARDS (adult respiratory distress syndrome) ([place])   . ARDS (adult respiratory distress syndrome) ([place])   . Bipolar disorder ([place])   . Borderline diabetes   . Cataract   . Chronic kidney disease   . COPD (chronic obstructive pulmonary disease) (HCC)    chronic cough and wheezing  . Depression   . Diabetes mellitus without complication ([place])   . Dizziness    medication related  . Dyspnea    with exertion  . Elevated lipids   . Fatty liver   . Hypercholesterolemia   . Pancreatitis   . Pneumonia    in past  . Pneumonia    in past  . Pre-diabetes    diet controlled  . Pulmonary embolism ([place])   . Schizoaffective disorder [facility])     Problem List Patient Active  Problem List   Diagnosis Date Noted  . Type 2 diabetes mellitus with hyperglycemia, without long-term current use of insulin ([place]) 04/29/2019  . Chronic obstructive pulmonary disease ([place]) 04/29/2019  . Alcohol abuse 04/29/2019  . Bipolar depression ([place]) 04/29/2019  . Umbilical hernia without obstruction and without gangrene 04/23/2019  . Acute pancreatitis 12/31/2018  . Pancreatitis 07/02/2018    Past Surgical Hx Past Surgical History:  Procedure Laterality Date  . CATARACT EXTRACTION W/PHACO Right 03/26/2018   Procedure: CATARACT EXTRACTION PHACO AND INTRAOCULAR LENS PLACEMENT ([place]) RIGHT BORDERLINE DIABETIC;  Surgeon: Leandrew Koyanagi, MD;  Location: [place];  Service: Ophthalmology;  Laterality: Right;  . CATARACT EXTRACTION W/PHACO Left 04/23/2018   Procedure: CATARACT EXTRACTION PHACO AND INTRAOCULAR LENS PLACEMENT ([place])  LEFT BORDERLINE DIABETIC;  Surgeon: Leandrew Koyanagi, MD;  Location: [place];  Service: Ophthalmology;  Laterality: Left;  . COLONOSCOPY WITH PROPOFOL N/A 08/21/2017   Procedure: COLONOSCOPY WITH PROPOFOL;  Surgeon: Manya Silvas, MD;  Location: [facility] ENDOSCOPY;  Service: Endoscopy;  Laterality: N/A;  . EYE SURGERY    . HERNIA REPAIR    . UMBILICAL HERNIA REPAIR N/A 06/22/2019   Procedure: OPEN HERNIA REPAIR UMBILICAL ADULT;  Surgeon: Olean Ree, MD;  Location: [facility] ORS;  Service: General;  Laterality: N/A;    Medications Prior to Admission medications   Medication  Sig Start Date End Date Taking? Authorizing Provider  albuterol (VENTOLIN HFA) 108 (90 Base) MCG/ACT inhaler Inhale 2 puffs into the lungs every 6 (six) hours as needed. 04/29/19 07/28/19  Trinna Post, PA-C  ALPRAZolam Duanne Moron) 0.5 MG tablet Take 0.5 mg by mouth 4 (four) times daily. Patient takes when wakes up(~0600)/ 1000/ 1600/ 2000 06/26/18   [provider]  empagliflozin (JARDIANCE) 25 MG TABS tablet Take 25 mg by mouth daily.    [provider]  glucose blood test strip Use as instructed 05/15/19   Trinna Post, PA-C  HYDROmorphone (DILAUDID) 2 MG tablet Take 1 tablet (2 mg total) by mouth every 4 (four) hours as needed for severe pain. Patient not taking: Reported on 06/30/2019 06/22/19   Olean Ree, MD  HYDROmorphone (DILAUDID) 2 MG tablet Take 1 tablet (2 mg total) by mouth every 12 (twelve) hours as needed for severe pain. Patient not taking: Reported on 06/30/2019 06/26/19 06/25/20  Olean Ree, MD  ibuprofen (ADVIL) 600 MG tablet Take 1 tablet (600 mg total) by mouth every 8 (eight) hours as needed. 06/22/19   Olean Ree, MD  Insulin Glargine (LANTUS) 100 UNIT/ML Solostar Pen Inject 10 Units into the skin at bedtime.     [provider]  metFORMIN (GLUCOPHAGE-XR) 500 MG 24 hr tablet Take 2 tablets (1,000 mg total) by mouth 2 (two) times daily. 06/15/19 12/12/19  Trinna Post, PA-C  OLANZapine (ZYPREXA) 15 MG tablet Take 15 mg by mouth daily at 8 pm. At 2000    [provider]  ondansetron (ZOFRAN) 8 MG tablet Take 1 tablet (8 mg total) by mouth every 8 (eight) hours as needed for nausea or vomiting. 04/29/19   Trinna Post, PA-C  OneTouch Delica Lancets 99991111 MISC Use to check fasting sugar once daily. 05/15/19   Trinna Post, PA-C  sertraline (ZOLOFT) 100 MG tablet Take 100 mg by mouth daily with breakfast.     [provider]  simvastatin (ZOCOR) 40 MG tablet Take 1 tablet (40 mg total) by mouth daily. Takes At 1600 05/25/19 08/23/19  Trinna Post, PA-C  UNIFINE PENTIPS 32G X 4 MM MISC USE WITH INSULIN ONCE DAILY 06/25/19   Trinna Post, PA-C    Allergies Morphine and related, Klonopin [clonazepam], Pimozide, Stelazine [trifluoperazine], and Fentanyl  Family Hx Family History  Problem Relation Age of Onset  . Hyperlipidemia Mother   . Hypertension Father   . Dementia Maternal Grandmother   . Heart disease Maternal Grandfather   . Heart disease Paternal  Grandmother   . Stroke Paternal Grandfather     Social Hx Social History   Tobacco Use  . Smoking status: Current Every Day Smoker    Packs/day: 1.00    Years: 30.00    Pack years: 30.00    Types: Cigarettes  . Smokeless tobacco: Former Systems developer    Types: Snuff  Substance Use Topics  . Alcohol use: Yes  . Drug use: Not Currently     Review of Systems  Constitutional: Negative for fever. Negative for chills. Eyes: Negative for visual changes. ENT: Negative for sore throat. Cardiovascular: Negative for chest pain. Respiratory: Negative for shortness of breath. Gastrointestinal: Negative for abdominal pain. Negative for nausea. Negative for vomiting. Genitourinary: Negative for dysuria. Musculoskeletal: Negative for leg swelling. Skin: Negative for rash. Neurological: Negative for for headaches.   Physical Exam  Vital Signs: ED Triage Vitals [06/30/19 1524]  Enc Vitals Group     BP Marland Kitchen)  107/56     Pulse Rate (!) 59     Resp 16     Temp 98.8 F (37.1 C)     Temp Source Oral     SpO2 96 %     Weight 175 lb 12.8 oz (79.7 kg)     Height 5\' 8"  (1.727 m)     Head Circumference      Peak Flow      Pain Score 0     Pain Loc      Pain Edu?      Excl. in [place]?     Constitutional: Alert and oriented.  Eyes: Conjunctivae clear. Sclera anicteric. Head: Normocephalic. Atraumatic. Nose: No congestion. No rhinorrhea. Mouth/Throat: Mucous membranes are moist.  Neck: No stridor.   Cardiovascular: Normal rate, regular rhythm. No murmurs. Extremities well perfused. Respiratory: Normal respiratory effort.  Lungs CTAB. Gastrointestinal: Soft and non-tender. No distention.  Musculoskeletal: No lower extremity edema, swelling, or asymmetry. Neurologic:  Normal speech and language. No gross focal neurologic deficits are appreciated.  Skin: Skin is warm, dry and intact.  Umbilical hernia incision site is clean, dry, intact without surrounding erythema. Psychiatric: Mood and affect  are appropriate for situation.  EKG  Personally reviewed.   Rate: 58, mildly bradycardic Rhythm: sinus Axis: borderline leftward Intervals: within normal limits Non-specific T wave changes, TWI in lead III No acute ischemic changes   Radiology  XR: IMPRESSION: No active cardiopulmonary disease.    Procedures  Procedure(s) performed (including critical care):  Procedures   Initial Impression / Assessment and Plan / ED Course  53 y.o. male with a history of alcohol use, COPD, s/p umbilical hernia repair on 06/22/19, who presents to the ED from clinic for an episode of low blood pressure in the clinic, asymptomatic.  On exam, BP 100/60, feels well, asymptomatic.  Lungs clear.  No murmurs.  Abdomen soft nontender.  Surgical incision site is well-healing.  No lower extremity swelling, asymmetry, or edema.  Labs reveal normal electrolytes. Normal hemoglobin, do not suspect postoperative bleeding.  No evidence of infection on urinalysis, do not suspect UTI.  No acute ischemic changes on EKG, and troponin less than 2, therefore low suspicion for cardiac etiology. His incision site is well appearing, do not suspect surrounding infection or cellulitis.  No lower extremity swelling, asymmetry, edema, patient specifically denies any history of DVT or PE, and without any tachycardia, shortness of breath, or hypoxia low suspicion for VTE.  Patient is feeling well, asking to eat and be discharged.  Given his negative work-up, feel this is reasonable. He has tolerated PO here. Emphasized the importance of adequate hydration, including fluids other than coffee and tea.  Advise close PCP follow-up and given return precautions.  Patient voices understanding and is comfortable to plan and discharge.   Final Clinical Impression(s) / ED Diagnosis  Final diagnoses:  Low blood pressure reading     Note:  This document was prepared using Dragon voice recognition software and may include  unintentional dictation errors.     Lilia Pro., MD 06/30/19 [phone]

## 2019-06-30 NOTE — ED Notes (Signed)
Meal tray given 

## 2019-07-02 ENCOUNTER — Telehealth: Payer: Self-pay | Admitting: *Deleted

## 2019-07-02 LAB — URINE CULTURE

## 2019-07-02 NOTE — Telephone Encounter (Signed)
LMOVM for pt to return call 

## 2019-07-02 NOTE — Telephone Encounter (Signed)
-----   Message from Trinna Post, [place] sent at 07/02/2019  8:21 AM EDT ----- Urine culture does not show any infection.

## 2019-07-03 ENCOUNTER — Telehealth: Payer: Self-pay | Admitting: Surgery

## 2019-07-03 NOTE — Telephone Encounter (Signed)
Telephone Triage Questions    Type of surgery? OPEN HERNIA REPAIR UMBILICAL                       Date?       06/22/2019         Physician?    Dr. Hampton Abbot    Pain ? Slight pain  Where and how severe, (1-10)  3  Nausea, vomiting, Fever, chills? nausea   Any drainage? (Color) looks clear but may have blood in it  Does it feel hot to the touch?  Little bit, swollen above belly button   Any Bright redness around the wound/ area? no  Constipation / Diarrhea? Little diarrhea  Last Bowel movement? This morning   Current Antibiotics or Narcotics? no  Have you tried Ibuprofen/Tylenol combination for your pain? no

## 2019-07-03 NOTE — Telephone Encounter (Signed)
Patient states that the drainage has stopped and it was light pink in color. Not thick and no increase in pain. He has concerns as he is having some lightheadedness, shortness of breath and some chest discomfort. He was in the ER for this on 06/30/19 and was told to come back if the symptoms returned. Patient advised to return to the ER today and he is amendable to this.

## 2019-07-03 NOTE — Telephone Encounter (Signed)
Patient notified of culture results.

## 2019-07-06 ENCOUNTER — Telehealth: Payer: Self-pay | Admitting: Physician Assistant

## 2019-07-06 NOTE — Telephone Encounter (Signed)
Do not go off jardiance his sugars are not controlled. Two months.

## 2019-07-06 NOTE — Telephone Encounter (Signed)
Pt called wanting to know if he stopped the Jardiance how does he go about it.    CB3  [phone]  teri

## 2019-07-06 NOTE — Telephone Encounter (Signed)
LVMTRC 

## 2019-07-06 NOTE — Telephone Encounter (Signed)
Pt also wants to know when he can return for his next visit.  teri

## 2019-07-07 NOTE — Telephone Encounter (Signed)
Patient advised as below. Will call back to schedule appt.

## 2019-07-08 ENCOUNTER — Telehealth: Payer: Self-pay | Admitting: Physician Assistant

## 2019-07-08 NOTE — Telephone Encounter (Signed)
No I explicitly told him NOT to stop it. The medical assistants relay messages that I write. If he did, that's his decision. We'll have to adjust his medications when he comes for follow up.

## 2019-07-08 NOTE — Telephone Encounter (Signed)
Needing more test strips prescribed.  Pt is having to test blood sugar more than one time a day. Due to feeling very bad. Headaches Low blood sugar Fatigued  Pt is confused on taking empagliflozin (JARDIANCE) 25 MG TABS tablet. He was told to keep taking the Jardiance by [facility] and was told by [name] he could stop.   Pt states he feels awful.  He stopped taking it for 2 days and felt great.  Please advise.  Thanks, [employer]

## 2019-07-08 NOTE — Telephone Encounter (Signed)
Pt advised.   Thanks,   -Makaylah Oddo  

## 2019-07-10 ENCOUNTER — Other Ambulatory Visit: Payer: Self-pay | Admitting: *Deleted

## 2019-07-10 ENCOUNTER — Telehealth: Payer: Self-pay | Admitting: *Deleted

## 2019-07-10 DIAGNOSIS — E1165 Type 2 diabetes mellitus with hyperglycemia: Secondary | ICD-10-CM

## 2019-07-10 MED ORDER — METFORMIN HCL ER 500 MG PO TB24: 1000.0000 mg | ORAL_TABLET | Freq: Two times a day (BID) | ORAL | 1 refills | Status: DC

## 2019-07-10 NOTE — Telephone Encounter (Signed)
Error

## 2019-07-14 ENCOUNTER — Ambulatory Visit (INDEPENDENT_AMBULATORY_CARE_PROVIDER_SITE_OTHER): Payer: Medicare Other | Admitting: Surgery

## 2019-07-14 ENCOUNTER — Encounter: Payer: Self-pay | Admitting: Surgery

## 2019-07-14 ENCOUNTER — Other Ambulatory Visit: Payer: Self-pay

## 2019-07-14 VITALS — BP 120/62 | HR 79 | Temp 97.4°F | Ht 68.0 in | Wt 168.0 lb

## 2019-07-14 DIAGNOSIS — Z09 Encounter for follow-up examination after completed treatment for conditions other than malignant neoplasm: Secondary | ICD-10-CM

## 2019-07-14 DIAGNOSIS — K429 Umbilical hernia without obstruction or gangrene: Secondary | ICD-10-CM

## 2019-07-14 NOTE — Patient Instructions (Signed)
Return as needed.The patient is aware to call back for any questions or concerns.  

## 2019-07-14 NOTE — Progress Notes (Signed)
07/14/2019  HPI: Shaun Odonnell is a 53 y.o. male s/p open umbilical hernia repair on 8/17.  He presents today for follow-up.  He did need a refill of his Dilaudid pain medication x1 but otherwise has been doing well.  He was in the emergency room on 8/25 for hypotension and has been doing better since.  Reports that he had a small amount of serous drainage from the incision at one point but otherwise is doing well now.  Separately he is reporting that he has had an issue with his xiphoid process over the years and has been protruding more and now causing discomfort.  Vital signs: BP 120/62   Pulse 79   Temp (!) 97.4 F (36.3 C) (Skin)   Ht 5\' 8"  (1.727 m)   Wt 168 lb (76.2 kg)   SpO2 98%   BMI 25.54 kg/m    Physical Exam: Constitutional: No acute distress Abdomen: Soft, nondistended, nontender to palpation.  Patient has an umbilical incision which is healing well.  There is an area in the midportion of the incision that looks like it had opened up in the past consistent with a serous drainage but right now is sealed.  Firm tissue in the supraumbilical area consistent with scarring. Chest: Patient's xiphoid process protruding outwards pushing on the skin with a bruise at the tip.  Assessment/Plan: This is a 53 y.o. male s/p umbilical hernia repair.  -Discussed with patient that from a surgical standpoint he is doing well there is no areas of concern at this point.  Continue with lifting restrictions for 1 more week and afterwards resume activity as usual. - We will discuss with Dr. Genevive Bi to see if we can refer this patient to him for his issue with the xiphoid process versus referring to [facility] cardiothoracic surgery. -Follow-up with [place] PRN.   Melvyn Neth, [facility]

## 2019-07-28 ENCOUNTER — Other Ambulatory Visit: Payer: Self-pay | Admitting: Physician Assistant

## 2019-07-28 ENCOUNTER — Ambulatory Visit: Payer: Self-pay

## 2019-07-28 ENCOUNTER — Telehealth: Payer: Self-pay | Admitting: Physician Assistant

## 2019-07-28 DIAGNOSIS — E1165 Type 2 diabetes mellitus with hyperglycemia: Secondary | ICD-10-CM

## 2019-07-28 MED ORDER — UNIFINE PENTIPS 32G X 4 MM MISC: 5 refills | Status: DC

## 2019-07-28 NOTE — Telephone Encounter (Signed)
Pt. Asking about possible complications from having surgery on his xyphoid process. States he has an appointment with a surgeon next month. Instructed pt. To reach out to his PCP.Verbalizes understanding.

## 2019-07-28 NOTE — Telephone Encounter (Signed)
Pt needs refill on his needles for his diabetic medication.  [facility]  Also pt has an appt with a surgeon Oct 2 discuss [place].  He would like to talk to Fabio Bering about this before his appt if possible.  CB#  [phone]  teri

## 2019-07-28 NOTE — Telephone Encounter (Signed)
Pt needing to speak with Dr. Terrilee Croak.  Xiphoid Process Surgery - needing to discuss.  Please call pt back today if possible.  Thanks, [employer]

## 2019-07-29 MED ORDER — UNIFINE PENTIPS 32G X 4 MM MISC: 1 refills | Status: DC

## 2019-07-29 NOTE — Telephone Encounter (Signed)
He can schedule an appointment if he wants to discuss it.

## 2019-07-29 NOTE — Telephone Encounter (Signed)
Pt advised.  He decided to wait on the appointment with the surgeon.  Pt also needed a refill on his insulin needles.  I sent that to [facility] in [place].   Thanks,   -Mickel Baas

## 2019-08-03 ENCOUNTER — Other Ambulatory Visit: Payer: Self-pay | Admitting: *Deleted

## 2019-08-03 DIAGNOSIS — R110 Nausea: Secondary | ICD-10-CM

## 2019-08-03 MED ORDER — ONDANSETRON HCL 8 MG PO TABS: 8.0000 mg | ORAL_TABLET | Freq: Three times a day (TID) | ORAL | 0 refills | Status: DC | PRN

## 2019-08-05 ENCOUNTER — Telehealth: Payer: Self-pay

## 2019-08-05 ENCOUNTER — Telehealth: Payer: Self-pay | Admitting: Physician Assistant

## 2019-08-05 NOTE — Telephone Encounter (Signed)
Pt called saying he checked with the insurance if they would pay for the shingrex vaccine.  They told him he would have to give it to him from the pharmacy not the doctors office.  They told him we would have to give a rx to [place] for him to have it done.  Please advise  [phone]  [place]

## 2019-08-05 NOTE — Telephone Encounter (Signed)
Patient advised as below.  

## 2019-08-05 NOTE — Telephone Encounter (Signed)
I need a verbal okay for his shingles vaccine.  Thanks,   -Mickel Baas

## 2019-08-05 NOTE — Telephone Encounter (Signed)
Yes dissolving is fine. 8 mg #20 no refills.

## 2019-08-05 NOTE — Telephone Encounter (Signed)
He can stop it for now and we can talk about alternatives at his visit on 08/19/2019.

## 2019-08-05 NOTE — Telephone Encounter (Signed)
Yes shingles is fine. He was also asking for zofran from separate phone note.

## 2019-08-05 NOTE — Telephone Encounter (Signed)
1. Pt stated that previously he spoke with Shaun Odonnell about stopping empagliflozin (JARDIANCE) 25 MG TABS tablet because he didn't like the side effects and Shaun Odonnell changed him to insulin and Metformin. Pt stated that in August Shaun Odonnell advised pt that Shaun Odonnell wanted him to continue taking Jardiance. Pt stated that he would really like to stop taking Jardiance and is requesting call back to discuss if that is ok for him to stop and is so how should he stop the medication.   2.  Pt wants it noted that he doesn't take ondansetron (ZOFRAN) 8 MG tablets he prefers to take the ODT oral dissolving tablets.   Please advise. Thanks Shaun Odonnell

## 2019-08-05 NOTE — Telephone Encounter (Signed)
Shingles vaccine called in.

## 2019-08-05 NOTE — Telephone Encounter (Signed)
Is it okay to call in the Rx?  Thanks,   -Mickel Baas

## 2019-08-05 NOTE — Telephone Encounter (Signed)
NA

## 2019-08-06 ENCOUNTER — Ambulatory Visit (INDEPENDENT_AMBULATORY_CARE_PROVIDER_SITE_OTHER): Payer: Medicare Other | Admitting: Physician Assistant

## 2019-08-06 ENCOUNTER — Encounter: Payer: Self-pay | Admitting: Cardiothoracic Surgery

## 2019-08-06 ENCOUNTER — Ambulatory Visit (INDEPENDENT_AMBULATORY_CARE_PROVIDER_SITE_OTHER): Payer: Medicare Other | Admitting: Cardiothoracic Surgery

## 2019-08-06 ENCOUNTER — Other Ambulatory Visit: Payer: Self-pay

## 2019-08-06 VITALS — BP 105/68 | HR 66 | Temp 97.9°F | Resp 14 | Ht 68.0 in | Wt 174.4 lb

## 2019-08-06 DIAGNOSIS — R0789 Other chest pain: Secondary | ICD-10-CM

## 2019-08-06 DIAGNOSIS — Z23 Encounter for immunization: Secondary | ICD-10-CM

## 2019-08-06 NOTE — Progress Notes (Signed)
Patient ID: Shaun Odonnell, male   DOB: Jun 15, 1966, 53 y.o.   MRN: [id number]  Chief Complaint  Patient presents with  . New Patient (Initial Visit)    protruding xiphoid process    Referred By Dr. Claudean Severance Reason for Referral painful xiphoid process  HPI Location, Quality, Duration, Severity, Timing, Context, Modifying Factors, Associated Signs and Symptoms.  Shaun Odonnell is a 53 y.o. male.  He states that during his early adult life he noticed that his distal sternum (xiphoid process) was always more prominent.  He gained a lot of weight and is early adult life and then had no issues with it.  He developed diabetes and was ultimately switched to [facility] and lost a significant amount of weight.  Since that time is noticed that his xiphoid has been more protuberant and that he develops pain whenever he washes or dries his skin.  He finds this and extreme irritation to them.  He also finds that he has developed bruising whenever he lies on his stomach because his xiphoid process sticks out so far.  He recently underwent a umbilical hernia repair.  He had a CT scan done.  That did show the xiphoid process to be sticking up quite a bit.  It is limited to the xiphoid and the rest of the sternum is normal.   Past Medical History:  Diagnosis Date  . Alcohol abuse 04/29/2019  . Allergy   . Anxiety   . ARDS (adult respiratory distress syndrome) ([place])   . ARDS (adult respiratory distress syndrome) ([place])   . Bipolar disorder ([place])   . Borderline diabetes   . Cataract   . Chronic kidney disease   . COPD (chronic obstructive pulmonary disease) (HCC)    chronic cough and wheezing  . Depression   . Diabetes mellitus without complication ([place])   . Dizziness    medication related  . Dyspnea    with exertion  . Elevated lipids   . Fatty liver   . Hypercholesterolemia   . Pancreatitis   . Pneumonia    in past  . Pneumonia    in past  . Pre-diabetes    diet controlled  .  Pulmonary embolism ([place])   . Schizoaffective disorder [facility])     Past Surgical History:  Procedure Laterality Date  . CATARACT EXTRACTION W/PHACO Right 03/26/2018   Procedure: CATARACT EXTRACTION PHACO AND INTRAOCULAR LENS PLACEMENT ([place]) RIGHT BORDERLINE DIABETIC;  Surgeon: Leandrew Koyanagi, MD;  Location: [place];  Service: Ophthalmology;  Laterality: Right;  . CATARACT EXTRACTION W/PHACO Left 04/23/2018   Procedure: CATARACT EXTRACTION PHACO AND INTRAOCULAR LENS PLACEMENT ([place])  LEFT BORDERLINE DIABETIC;  Surgeon: Leandrew Koyanagi, MD;  Location: [place];  Service: Ophthalmology;  Laterality: Left;  . COLONOSCOPY WITH PROPOFOL N/A 08/21/2017   Procedure: COLONOSCOPY WITH PROPOFOL;  Surgeon: Manya Silvas, MD;  Location: [facility] ENDOSCOPY;  Service: Endoscopy;  Laterality: N/A;  . EYE SURGERY    . HERNIA REPAIR    . UMBILICAL HERNIA REPAIR N/A 06/22/2019   Procedure: OPEN HERNIA REPAIR UMBILICAL ADULT;  Surgeon: Olean Ree, MD;  Location: [facility] ORS;  Service: General;  Laterality: N/A;    Family History  Problem Relation Age of Onset  . Hyperlipidemia Mother   . Hypertension Father   . Dementia Maternal Grandmother   . Heart disease Maternal Grandfather   . Heart disease Paternal Grandmother   . Stroke Paternal Grandfather     Social History Social History  Tobacco Use  . Smoking status: Current Every Day Smoker    Packs/day: 1.00    Years: 30.00    Pack years: 30.00    Types: Cigarettes  . Smokeless tobacco: Former Systems developer    Types: Snuff  Substance Use Topics  . Alcohol use: Yes  . Drug use: Not Currently    Allergies  Allergen Reactions  . Morphine And Related Shortness Of Breath  . Klonopin [Clonazepam] Other (See Comments)    Dystonic Reaction    . Pimozide Other (See Comments)    Dystonic Reaction   . Stelazine [Trifluoperazine] Other (See Comments)    Dystonic Reaction  . Fentanyl Nausea And Vomiting    Current  Outpatient Medications  Medication Sig Dispense Refill  . ALPRAZolam (XANAX) 0.5 MG tablet Take 0.5 mg by mouth 4 (four) times daily. Patient takes when wakes up(~0600)/ 1000/ 1600/ 2000    . glucose blood test strip Use as instructed 100 each 12  . Insulin Glargine (LANTUS) 100 UNIT/ML Solostar Pen Inject 10 Units into the skin at bedtime.     . Insulin Pen Needle (UNIFINE PENTIPS) 32G X 4 MM MISC USE WITH INSULIN ONCE DAILY 90 each 1  . metFORMIN (GLUCOPHAGE-XR) 500 MG 24 hr tablet Take 2 tablets (1,000 mg total) by mouth 2 (two) times daily. 360 tablet 1  . OLANZapine (ZYPREXA) 15 MG tablet Take 15 mg by mouth daily at 8 pm. At 2000    . ondansetron (ZOFRAN) 8 MG tablet Take 1 tablet (8 mg total) by mouth every 8 (eight) hours as needed for nausea or vomiting. 20 tablet 0  . OneTouch Delica Lancets 99991111 MISC Use to check fasting sugar once daily. 100 each 1  . sertraline (ZOLOFT) 100 MG tablet Take 100 mg by mouth daily with breakfast.     . simvastatin (ZOCOR) 40 MG tablet Take 40 mg by mouth daily.    Marland Kitchen albuterol (VENTOLIN HFA) 108 (90 Base) MCG/ACT inhaler Inhale 2 puffs into the lungs every 6 (six) hours as needed. 8 g 3   No current facility-administered medications for this visit.       Review of Systems A complete review of systems was asked and was negative except for the following positive findings fatigue, diabetes, hernia, colon polyp, urinary incontinence, pain in his xiphoid  Blood pressure 105/68, pulse 66, temperature 97.9 F (36.6 C), temperature source Temporal, resp. rate 14, height 5\' 8"  (1.727 m), weight 174 lb 6.4 oz (79.1 kg), SpO2 96 %.  Physical Exam CONSTITUTIONAL:  Pleasant, well-developed, well-nourished, and in no acute distress. EYES: Pupils equal and reactive to light, Sclera non-icteric EARS, NOSE, MOUTH AND THROAT:  The oropharynx was clear.  Dentition is poor repair.  Oral mucosa pink and moist. LYMPH NODES:  Lymph nodes in the neck and axillae were  normal RESPIRATORY:  Lungs were clear.  Normal respiratory effort without pathologic use of accessory muscles of respiration CARDIOVASCULAR: Heart was regular without murmurs.  There were no carotid bruits. GI: The abdomen was soft, nontender, and nondistended. There were no palpable masses. There was no hepatosplenomegaly. There were normal bowel sounds in all quadrants. GU:  Rectal deferred.   MUSCULOSKELETAL:  Normal muscle strength and tone.  No clubbing or cyanosis.  The xiphoid process is quite protuberant.  There is no other associated abnormalities.  He does have a diastases recti. SKIN:  There were no pathologic skin lesions.  There were no nodules on palpation. NEUROLOGIC:  Sensation is normal.  Cranial nerves are grossly intact. PSYCH:  Oriented to person, place and time.  Mood and affect are normal.  Data Reviewed CT scan of the abdomen  I have personally reviewed the patient's imaging, laboratory findings and medical records.    Assessment    Painful xiphoid process    Plan    I reviewed with him the indications and risks of surgical resection of his xiphoid process.  Risks of bleeding, infection, hernia were all discussed.  I told him that the main indication was the pain that he is experiencing with it.  There is no medical reason why it would need to be repaired if it were completely asymptomatic.  He understands and would like to proceed.       Nestor Lewandowsky, MD 08/06/2019, 10:20 AM

## 2019-08-06 NOTE — H&P (View-Only) (Signed)
Patient ID: Shaun Odonnell, male   DOB: 02-17-1966, 53 y.o.   MRN: [id number]  Chief Complaint  Patient presents with  . New Patient (Initial Visit)    protruding xiphoid process    Referred By Dr. Claudean Severance Reason for Referral painful xiphoid process  HPI Location, Quality, Duration, Severity, Timing, Context, Modifying Factors, Associated Signs and Symptoms.  Shaun Odonnell is a 53 y.o. male.  He states that during his early adult life he noticed that his distal sternum (xiphoid process) was always more prominent.  He gained a lot of weight and is early adult life and then had no issues with it.  He developed diabetes and was ultimately switched to [facility] and lost a significant amount of weight.  Since that time is noticed that his xiphoid has been more protuberant and that he develops pain whenever he washes or dries his skin.  He finds this and extreme irritation to them.  He also finds that he has developed bruising whenever he lies on his stomach because his xiphoid process sticks out so far.  He recently underwent a umbilical hernia repair.  He had a CT scan done.  That did show the xiphoid process to be sticking up quite a bit.  It is limited to the xiphoid and the rest of the sternum is normal.   Past Medical History:  Diagnosis Date  . Alcohol abuse 04/29/2019  . Allergy   . Anxiety   . ARDS (adult respiratory distress syndrome) ([place])   . ARDS (adult respiratory distress syndrome) ([place])   . Bipolar disorder ([place])   . Borderline diabetes   . Cataract   . Chronic kidney disease   . COPD (chronic obstructive pulmonary disease) (HCC)    chronic cough and wheezing  . Depression   . Diabetes mellitus without complication ([place])   . Dizziness    medication related  . Dyspnea    with exertion  . Elevated lipids   . Fatty liver   . Hypercholesterolemia   . Pancreatitis   . Pneumonia    in past  . Pneumonia    in past  . Pre-diabetes    diet controlled  .  Pulmonary embolism ([place])   . Schizoaffective disorder [facility])     Past Surgical History:  Procedure Laterality Date  . CATARACT EXTRACTION W/PHACO Right 03/26/2018   Procedure: CATARACT EXTRACTION PHACO AND INTRAOCULAR LENS PLACEMENT ([place]) RIGHT BORDERLINE DIABETIC;  Surgeon: Leandrew Koyanagi, MD;  Location: [place];  Service: Ophthalmology;  Laterality: Right;  . CATARACT EXTRACTION W/PHACO Left 04/23/2018   Procedure: CATARACT EXTRACTION PHACO AND INTRAOCULAR LENS PLACEMENT ([place])  LEFT BORDERLINE DIABETIC;  Surgeon: Leandrew Koyanagi, MD;  Location: [place];  Service: Ophthalmology;  Laterality: Left;  . COLONOSCOPY WITH PROPOFOL N/A 08/21/2017   Procedure: COLONOSCOPY WITH PROPOFOL;  Surgeon: Manya Silvas, MD;  Location[facility] ENDOSCOPY;  Service: Endoscopy;  Laterality: N/A;  . EYE SURGERY    . HERNIA REPAIR    . UMBILICAL HERNIA REPAIR N/A 06/22/2019   Procedure: OPEN HERNIA REPAIR UMBILICAL ADULT;  Surgeon: Olean Ree, MD;  Location: [facility] ORS;  Service: General;  Laterality: N/A;    Family History  Problem Relation Age of Onset  . Hyperlipidemia Mother   . Hypertension Father   . Dementia Maternal Grandmother   . Heart disease Maternal Grandfather   . Heart disease Paternal Grandmother   . Stroke Paternal Grandfather     Social History Social History  Tobacco Use  . Smoking status: Current Every Day Smoker    Packs/day: 1.00    Years: 30.00    Pack years: 30.00    Types: Cigarettes  . Smokeless tobacco: Former Systems developer    Types: Snuff  Substance Use Topics  . Alcohol use: Yes  . Drug use: Not Currently    Allergies  Allergen Reactions  . Morphine And Related Shortness Of Breath  . Klonopin [Clonazepam] Other (See Comments)    Dystonic Reaction    . Pimozide Other (See Comments)    Dystonic Reaction   . Stelazine [Trifluoperazine] Other (See Comments)    Dystonic Reaction  . Fentanyl Nausea And Vomiting    Current  Outpatient Medications  Medication Sig Dispense Refill  . ALPRAZolam (XANAX) 0.5 MG tablet Take 0.5 mg by mouth 4 (four) times daily. Patient takes when wakes up(~0600)/ 1000/ 1600/ 2000    . glucose blood test strip Use as instructed 100 each 12  . Insulin Glargine (LANTUS) 100 UNIT/ML Solostar Pen Inject 10 Units into the skin at bedtime.     . Insulin Pen Needle (UNIFINE PENTIPS) 32G X 4 MM MISC USE WITH INSULIN ONCE DAILY 90 each 1  . metFORMIN (GLUCOPHAGE-XR) 500 MG 24 hr tablet Take 2 tablets (1,000 mg total) by mouth 2 (two) times daily. 360 tablet 1  . OLANZapine (ZYPREXA) 15 MG tablet Take 15 mg by mouth daily at 8 pm. At 2000    . ondansetron (ZOFRAN) 8 MG tablet Take 1 tablet (8 mg total) by mouth every 8 (eight) hours as needed for nausea or vomiting. 20 tablet 0  . OneTouch Delica Lancets 99991111 MISC Use to check fasting sugar once daily. 100 each 1  . sertraline (ZOLOFT) 100 MG tablet Take 100 mg by mouth daily with breakfast.     . simvastatin (ZOCOR) 40 MG tablet Take 40 mg by mouth daily.    Marland Kitchen albuterol (VENTOLIN HFA) 108 (90 Base) MCG/ACT inhaler Inhale 2 puffs into the lungs every 6 (six) hours as needed. 8 g 3   No current facility-administered medications for this visit.       Review of Systems A complete review of systems was asked and was negative except for the following positive findings fatigue, diabetes, hernia, colon polyp, urinary incontinence, pain in his xiphoid  Blood pressure 105/68, pulse 66, temperature 97.9 F (36.6 C), temperature source Temporal, resp. rate 14, height 5\' 8"  (1.727 m), weight 174 lb 6.4 oz (79.1 kg), SpO2 96 %.  Physical Exam CONSTITUTIONAL:  Pleasant, well-developed, well-nourished, and in no acute distress. EYES: Pupils equal and reactive to light, Sclera non-icteric EARS, NOSE, MOUTH AND THROAT:  The oropharynx was clear.  Dentition is poor repair.  Oral mucosa pink and moist. LYMPH NODES:  Lymph nodes in the neck and axillae were  normal RESPIRATORY:  Lungs were clear.  Normal respiratory effort without pathologic use of accessory muscles of respiration CARDIOVASCULAR: Heart was regular without murmurs.  There were no carotid bruits. GI: The abdomen was soft, nontender, and nondistended. There were no palpable masses. There was no hepatosplenomegaly. There were normal bowel sounds in all quadrants. GU:  Rectal deferred.   MUSCULOSKELETAL:  Normal muscle strength and tone.  No clubbing or cyanosis.  The xiphoid process is quite protuberant.  There is no other associated abnormalities.  He does have a diastases recti. SKIN:  There were no pathologic skin lesions.  There were no nodules on palpation. NEUROLOGIC:  Sensation is normal.  Cranial nerves are grossly intact. PSYCH:  Oriented to person, place and time.  Mood and affect are normal.  Data Reviewed CT scan of the abdomen  I have personally reviewed the patient's imaging, laboratory findings and medical records.    Assessment    Painful xiphoid process    Plan    I reviewed with him the indications and risks of surgical resection of his xiphoid process.  Risks of bleeding, infection, hernia were all discussed.  I told him that the main indication was the pain that he is experiencing with it.  There is no medical reason why it would need to be repaired if it were completely asymptomatic.  He understands and would like to proceed.       Nestor Lewandowsky, MD 08/06/2019, 10:20 AM

## 2019-08-06 NOTE — Patient Instructions (Addendum)
Please call our office if you have questions or concerns. Our surgery scheduler will call you within 24-48 hours to schedule your surgery.

## 2019-08-07 ENCOUNTER — Ambulatory Visit: Payer: Medicare Other | Admitting: Cardiothoracic Surgery

## 2019-08-07 ENCOUNTER — Telehealth: Payer: Self-pay | Admitting: Cardiothoracic Surgery

## 2019-08-07 NOTE — Telephone Encounter (Signed)
Patient is calling and has questions about the shingles vaccine shot he wants to know can he have the shot before his surgery with Dr. Genevive Bi or if he needs to wait after surgery please call patient and advise.

## 2019-08-07 NOTE — Telephone Encounter (Signed)
Patient notified to wait until after his surgery to have his shingles vaccine.

## 2019-08-11 ENCOUNTER — Other Ambulatory Visit: Payer: Self-pay | Admitting: Physician Assistant

## 2019-08-11 ENCOUNTER — Telehealth: Payer: Self-pay | Admitting: Cardiothoracic Surgery

## 2019-08-11 NOTE — Telephone Encounter (Signed)
Patient is requesting a refill on his Simvastatin 40 MG. L.O.V. was 06/30/2019. Please advise.

## 2019-08-11 NOTE — Telephone Encounter (Signed)
Pt has been advised of pre admission date/time, Covid Testing date and Surgery date.  Surgery Date: 08/20/19 with Dr Oaks/Dr Hampton Abbot to assist-removal of xyphoid process.  Preadmission Testing Date: 08/17/19 @ 8:00am-office interview and arrive at the [facility] entrance, where he will be escorted to preadmission testing.   Covid Testing Date: 08/17/19 following his preadmission appointment - patient advised to go to the [facility] ([place])  [employer] Video sent via [employer] [facility] and [employer].  Patient has been made aware to call [phone], between 1-3:00pm the day before surgery, to find out what time to arrive.

## 2019-08-12 ENCOUNTER — Telehealth: Payer: Self-pay | Admitting: Physician Assistant

## 2019-08-12 ENCOUNTER — Telehealth: Payer: Self-pay | Admitting: *Deleted

## 2019-08-12 NOTE — Telephone Encounter (Signed)
Will be discussed at [facility].

## 2019-08-12 NOTE — Telephone Encounter (Signed)
Patient also had called that his BS average range have been 255 and fasting 149 and wants to know what he can do to help lower his sugar levels. He is schedule for surgery Thursday 10/15.

## 2019-08-12 NOTE — Telephone Encounter (Signed)
Pt wanting to increase his glucose testing to 2 times a day.  He feels like is bs is high at times during the day.   He is needing a refill on: glucose blood test strip wanting to test at least 2 times a day.  Please change Rx for more strips.  Please call into: [facility], [place]. [phone] (Phone) [phone] (Fax)   Thanks, [employer]

## 2019-08-12 NOTE — Telephone Encounter (Signed)
We'll talk about it at his follow up. He needs to know when to test and not just test randomly. Also his blood sugars will be high because he stopped his medication.

## 2019-08-12 NOTE — Telephone Encounter (Signed)
Patient called and has surgery scheduled for 08/20/19 with Dr.Oaks, patient has some questions regarding surgery and wanted to talk directly to Dr.Oaks about this. The main questions is how can he tell the pain from surgery that is different from having a heart attack, patient stated he has some more questions but he insisting on talking to Dr.Oaks. Patient is aware that he is out of town until next week and wants to wait until he gets back

## 2019-08-12 NOTE — Telephone Encounter (Signed)
Patient advised as below.  

## 2019-08-13 ENCOUNTER — Other Ambulatory Visit: Payer: Self-pay | Admitting: Cardiothoracic Surgery

## 2019-08-13 DIAGNOSIS — R0789 Other chest pain: Secondary | ICD-10-CM

## 2019-08-13 NOTE — Telephone Encounter (Signed)
Patient called back and wants you to call him after 12pm on Monday 08/17/19

## 2019-08-17 ENCOUNTER — Encounter
Admission: RE | Admit: 2019-08-17 | Discharge: 2019-08-17 | Disposition: A | Discharge status: Home / Self Care | Payer: Medicare Other | Source: Ambulatory Visit | Attending: Cardiothoracic Surgery | Admitting: Cardiothoracic Surgery

## 2019-08-17 ENCOUNTER — Other Ambulatory Visit: Payer: Self-pay

## 2019-08-17 DIAGNOSIS — Z20828 Contact with and (suspected) exposure to other viral communicable diseases: Secondary | ICD-10-CM | POA: Diagnosis not present

## 2019-08-17 DIAGNOSIS — Z01812 Encounter for preprocedural laboratory examination: Secondary | ICD-10-CM | POA: Insufficient documentation

## 2019-08-17 LAB — CBC WITH DIFFERENTIAL/PLATELET
Abs Immature Granulocytes: 0.1 10*3/uL — ABNORMAL HIGH (ref 0.00–0.07)
Basophils Absolute: 0 10*3/uL (ref 0.0–0.1)
Basophils Relative: 0 %
Eosinophils Absolute: 0.2 10*3/uL (ref 0.0–0.5)
Eosinophils Relative: 2 %
HCT: 47.7 % (ref 39.0–52.0)
Hemoglobin: 15.7 g/dL (ref 13.0–17.0)
Immature Granulocytes: 1 %
Lymphocytes Relative: 14 %
Lymphs Abs: 1.6 10*3/uL (ref 0.7–4.0)
MCH: 32.2 pg (ref 26.0–34.0)
MCHC: 32.9 g/dL (ref 30.0–36.0)
MCV: 97.7 fL (ref 80.0–100.0)
Monocytes Absolute: 0.6 10*3/uL (ref 0.1–1.0)
Monocytes Relative: 5 %
Neutro Abs: 8.8 10*3/uL — ABNORMAL HIGH (ref 1.7–7.7)
Neutrophils Relative %: 78 %
Platelets: 151 10*3/uL (ref 150–400)
RBC: 4.88 MIL/uL (ref 4.22–5.81)
RDW: 13.1 % (ref 11.5–15.5)
WBC: 11.3 10*3/uL — ABNORMAL HIGH (ref 4.0–10.5)
nRBC: 0 % (ref 0.0–0.2)

## 2019-08-17 LAB — COMPREHENSIVE METABOLIC PANEL
ALT: 18 U/L (ref 0–44)
AST: 17 U/L (ref 15–41)
Albumin: 4.1 g/dL (ref 3.5–5.0)
Alkaline Phosphatase: 84 U/L (ref 38–126)
Anion gap: 10 (ref 5–15)
BUN: 15 mg/dL (ref 6–20)
CO2: 27 mmol/L (ref 22–32)
Calcium: 9.1 mg/dL (ref 8.9–10.3)
Chloride: 96 mmol/L — ABNORMAL LOW (ref 98–111)
Creatinine, Ser: 0.51 mg/dL — ABNORMAL LOW (ref 0.61–1.24)
GFR calc Af Amer: >60 mL/min (ref >60)
GFR calc non Af Amer: >60 mL/min (ref >60)
Glucose, Bld: 236 mg/dL — ABNORMAL HIGH (ref 70–99)
Potassium: 4 mmol/L (ref 3.5–5.1)
Sodium: 133 mmol/L — ABNORMAL LOW (ref 135–145)
Total Bilirubin: 0.4 mg/dL (ref 0.3–1.2)
Total Protein: 7 g/dL (ref 6.5–8.1)

## 2019-08-17 LAB — SARS CORONAVIRUS 2 (TAT 6-24 HRS): SARS Coronavirus 2: NEGATIVE

## 2019-08-17 NOTE — Patient Instructions (Signed)
Your procedure is scheduled on: Thursday 08/20/19 Report to [place] [place]. To find out your arrival time please call [phone] between 1PM - 3PM on Wednesday 08/19/19.  Remember: Instructions that are not followed completely may result in serious medical risk, up to and including death, or upon the discretion of your surgeon and anesthesiologist your surgery may need to be rescheduled.     _X__ 1. Do not eat food after midnight the night before your procedure.                 No gum chewing or hard candies. You may drink clear liquids up to 2 hours                 before you are scheduled to arrive for your surgery- DO not drink clear                 liquids within 2 hours of the start of your surgery.                 Clear Liquids include:  water, apple juice without pulp, clear carbohydrate                 drink such as Clearfast or Gatorade, Black Coffee or Tea (Do not add                 anything to coffee or tea). Diabetics water only  __X__2.  On the morning of surgery brush your teeth with toothpaste and water, you                 may rinse your mouth with mouthwash if you wish.  Do not swallow any              toothpaste of mouthwash.     _X__ 3.  No Alcohol for 24 hours before or after surgery.   _X__ 4.  Do Not Smoke or use e-cigarettes For 24 Hours Prior to Your Surgery.                 Do not use any chewable tobacco products for at least 6 hours prior to                 surgery.  ____  5.  Bring all medications with you on the day of surgery if instructed.   __X__  6.  Notify your doctor if there is any change in your medical condition      (cold, fever, infections).     Do not wear jewelry, make-up, hairpins, clips or nail polish. Do not wear lotions, powders, or perfumes.  Do not shave 48 hours prior to surgery. Men may shave face and neck. Do not bring valuables to the hospital.    [facility] is not responsible  for any belongings or valuables.  Contacts, dentures/partials or body piercings may not be worn into surgery. Bring a case for your contacts, glasses or hearing aids, a denture cup will be supplied. Leave your suitcase in the car. After surgery it may be brought to your room. For patients admitted to the hospital, discharge time is determined by your treatment team.   Patients discharged the day of surgery will not be allowed to drive home.   Please read over the following fact sheets that you were given:   MRSA Information  __X__ Take these medicines the morning of surgery with A SIP OF WATER:  1. ALPRAZolam (XANAX)  2. sertraline (ZOLOFT  3.   4.  5.  6.  ____ Fleet Enema (as directed)   __X__ Use CHG Soap/SAGE wipes as directed  __X__ Use inhalers on the day of surgery  __X__ Stop metformin/Janumet/Farxiga 2 days prior to surgery    __X__ Take 1/2 of usual insulin dose the night before surgery. No insulin the morning          of surgery.   ____ Stop Blood Thinners Coumadin/Plavix/Xarelto/Pleta/Pradaxa/Eliquis/Effient/Aspirin  on   Or contact your Surgeon, Cardiologist or Medical Doctor regarding  ability to stop your blood thinners  __X__ Stop Anti-inflammatories 7 days before surgery such as Advil, Ibuprofen, Motrin,  BC or Goodies Powder, Naprosyn, Naproxen, Aleve, Aspirin    __X__ Stop all herbal supplements, fish oil or vitamin E until after surgery.    ____ Bring C-Pap to the hospital.

## 2019-08-17 NOTE — Pre-Procedure Instructions (Signed)
See ED physician note dated same day for EKG interpretation.

## 2019-08-18 ENCOUNTER — Telehealth: Payer: Self-pay

## 2019-08-18 ENCOUNTER — Telehealth: Payer: Self-pay | Admitting: *Deleted

## 2019-08-18 ENCOUNTER — Telehealth: Payer: Self-pay | Admitting: Physician Assistant

## 2019-08-18 NOTE — Telephone Encounter (Signed)
I will discuss this with him in office. This patient needs to know that if he has a medical issue he wants to discuss, he can make an office visit and we can talk about it. I WILL NOT be talking about any of his chronic issues on the phone. I will also not be calling this woman about oxygen because he is not a candidate. He cancelled his appointment for tomorrow. I will review his health issues when he SCHEDULES AN APPOINTMENT.

## 2019-08-18 NOTE — Telephone Encounter (Addendum)
Patient was advised. Patient stated he already has an appointment scheduled for 09/03/2019 and he will discuss issues with pcp then.

## 2019-08-18 NOTE — Telephone Encounter (Signed)
Patient to discuss specific narcotics with surgeon prior to his surgery the morning of.

## 2019-08-18 NOTE — Telephone Encounter (Signed)
After speaking with medical assistants, patient was audibly intoxicated on the phone. Bottles were heard to be clinking in the background. Patient has called this clinic multiple times with many and varied issues and has been repeatedly told to schedule an office visit to discuss his chronic issues.   Patient is scheduled for removal of Xiphoid process tomorrow with Dr. Genevive Bi.   Patient has discontinued jardiance against my advice. Unknown if he is taking his insulin correctly. He has stopped metformin at the direction of his surgeon two days prior to surgery but was advised this would not immediately and drastically change his sugars. His sugars are likely elevated due to noncompliance with medication. Advised his surgery may not proceed in the setting of his intoxication and uncontrolled sugars.   Just FYI.

## 2019-08-18 NOTE — Telephone Encounter (Signed)
Pt called saying his blood sugar is running high today.  He did say he has been drinking beer.  He stated he is having surgery on Thursday and will have to stop his Metformin 2 days before surgery.  He has not changed anything about his metformin as of now.  CB@  [phone]  [place]

## 2019-08-18 NOTE — Telephone Encounter (Signed)
Please advise 

## 2019-08-18 NOTE — Telephone Encounter (Signed)
Patient called and wanted to know what kind of pain medication ,milligrams, and how many he will get. He stated he can only take dilaudid. Please call and advise

## 2019-08-18 NOTE — Telephone Encounter (Signed)
Patient was advised. Patient stated he already has an appointment scheduled for 09/03/2019 and he will discuss issues with pcp then.

## 2019-08-18 NOTE — Telephone Encounter (Signed)
Yes, cannot release info to his mother as she is not on DPR.

## 2019-08-18 NOTE — Telephone Encounter (Signed)
Patient calling that a Di Kindle is going to be calling to talk to Fabio Bering on behalf of pt's health and he states that he is giving permission to [place] with [facility] to talk to provider regarding needing oxygen/or a candidate for oxygen because patient has trouble with his breathing and the albuterol inhalers are not helping much with his breathing and that he is always wheezing and feels like he needs more help. [place] (with gen1)

## 2019-08-18 NOTE — Telephone Encounter (Signed)
Patient's mother (who is not on the DPR) is calling that her son's sugar level is 317 he has missed 1 dose of Metformin because he is scheduled to have surgery done and the surgeon told not to take the Metformin and that she thinks his sugar level is high because he drinks and is drinking too much and just wants to know if the sugar level is high because of him drinking a lot of alcohol or because he is not on the Metformin. Told mother that I was sorry but I can't answer any of her question or talk to her about anything since she is not on patient's DPR.

## 2019-08-18 NOTE — Telephone Encounter (Signed)
Yes his sugar is high because as I said a month ago when I told him to NOT stop his jardiance and he did anyway that his sugars would increase. He was instructed to make a follow up visit regarding further management of his sugars and so far, he has not. It is not related to him missing metformin. I cannot manage this over the phone. Him drinking alcohol does not help either.

## 2019-08-19 ENCOUNTER — Ambulatory Visit: Payer: Medicare Other | Admitting: Physician Assistant

## 2019-08-20 ENCOUNTER — Other Ambulatory Visit: Payer: Self-pay

## 2019-08-20 ENCOUNTER — Inpatient Hospital Stay: Payer: Medicare Other

## 2019-08-20 ENCOUNTER — Encounter
Admission: RE | Disposition: A | Discharge status: Home / Self Care | Payer: Self-pay | Source: Ambulatory Visit | Attending: Cardiothoracic Surgery

## 2019-08-20 ENCOUNTER — Encounter: Payer: Self-pay | Admitting: Anesthesiology

## 2019-08-20 ENCOUNTER — Ambulatory Visit
Admission: RE | Admit: 2019-08-20 | Discharge: 2019-08-20 | Disposition: A | Discharge status: Home / Self Care | Payer: Medicare Other | Source: Ambulatory Visit | Attending: Cardiothoracic Surgery | Admitting: Cardiothoracic Surgery

## 2019-08-20 DIAGNOSIS — N189 Chronic kidney disease, unspecified: Secondary | ICD-10-CM | POA: Diagnosis not present

## 2019-08-20 DIAGNOSIS — Z79899 Other long term (current) drug therapy: Secondary | ICD-10-CM | POA: Insufficient documentation

## 2019-08-20 DIAGNOSIS — J449 Chronic obstructive pulmonary disease, unspecified: Secondary | ICD-10-CM | POA: Diagnosis not present

## 2019-08-20 DIAGNOSIS — F17210 Nicotine dependence, cigarettes, uncomplicated: Secondary | ICD-10-CM | POA: Insufficient documentation

## 2019-08-20 DIAGNOSIS — I129 Hypertensive chronic kidney disease with stage 1 through stage 4 chronic kidney disease, or unspecified chronic kidney disease: Secondary | ICD-10-CM | POA: Diagnosis not present

## 2019-08-20 DIAGNOSIS — Z86711 Personal history of pulmonary embolism: Secondary | ICD-10-CM | POA: Insufficient documentation

## 2019-08-20 DIAGNOSIS — F319 Bipolar disorder, unspecified: Secondary | ICD-10-CM | POA: Insufficient documentation

## 2019-08-20 DIAGNOSIS — E7800 Pure hypercholesterolemia, unspecified: Secondary | ICD-10-CM | POA: Insufficient documentation

## 2019-08-20 DIAGNOSIS — R0789 Other chest pain: Secondary | ICD-10-CM

## 2019-08-20 DIAGNOSIS — M954 Acquired deformity of chest and rib: Secondary | ICD-10-CM | POA: Insufficient documentation

## 2019-08-20 DIAGNOSIS — Z794 Long term (current) use of insulin: Secondary | ICD-10-CM | POA: Insufficient documentation

## 2019-08-20 DIAGNOSIS — F419 Anxiety disorder, unspecified: Secondary | ICD-10-CM | POA: Diagnosis not present

## 2019-08-20 DIAGNOSIS — F259 Schizoaffective disorder, unspecified: Secondary | ICD-10-CM | POA: Diagnosis not present

## 2019-08-20 DIAGNOSIS — E1122 Type 2 diabetes mellitus with diabetic chronic kidney disease: Secondary | ICD-10-CM | POA: Insufficient documentation

## 2019-08-20 HISTORY — PX: RIB RESECTION: SHX5077

## 2019-08-20 LAB — GLUCOSE, CAPILLARY
Glucose-Capillary: 227 mg/dL — ABNORMAL HIGH (ref 70–99)
Glucose-Capillary: 223 mg/dL — ABNORMAL HIGH (ref 70–99)

## 2019-08-20 SURGERY — EXCISION, RIB
Anesthesia: General

## 2019-08-20 MED ORDER — ONDANSETRON HCL 4 MG/2ML IJ SOLN
INTRAMUSCULAR | Status: DC | PRN
  Administered 2019-08-20: 4 mg via INTRAVENOUS

## 2019-08-20 MED ORDER — LIDOCAINE HCL 4 % MT SOLN
OROMUCOSAL | Status: DC | PRN
  Administered 2019-08-20: 4 mL via TOPICAL

## 2019-08-20 MED ORDER — ROCURONIUM BROMIDE 100 MG/10ML IV SOLN
INTRAVENOUS | Status: DC | PRN
  Administered 2019-08-20: 5 mg via INTRAVENOUS
  Administered 2019-08-20: 25 mg via INTRAVENOUS

## 2019-08-20 MED ORDER — FAMOTIDINE 20 MG PO TABS
20.0000 mg | ORAL_TABLET | Freq: Once | ORAL | Status: AC
  Administered 2019-08-20: 08:00:00 20 mg via ORAL

## 2019-08-20 MED ORDER — OXYCODONE HCL 5 MG/5ML PO SOLN: 5.0000 mg | Freq: Once | ORAL | Status: AC | PRN

## 2019-08-20 MED ORDER — OXYCODONE HCL 5 MG PO TABS
ORAL_TABLET | ORAL | Status: AC
  Filled 2019-08-20: qty 1

## 2019-08-20 MED ORDER — HYDROMORPHONE HCL 1 MG/ML IJ SOLN
0.2500 mg | INTRAMUSCULAR | Status: DC | PRN
  Administered 2019-08-20: 0.25 mg via INTRAVENOUS
  Administered 2019-08-20: 0.5 mg via INTRAVENOUS
  Administered 2019-08-20 (×3): 0.25 mg via INTRAVENOUS

## 2019-08-20 MED ORDER — BUPIVACAINE LIPOSOME 1.3 % IJ SUSP
INTRAMUSCULAR | Status: DC | PRN
  Administered 2019-08-20: 20 mL

## 2019-08-20 MED ORDER — HYDROMORPHONE HCL 1 MG/ML IJ SOLN
INTRAMUSCULAR | Status: DC | PRN
  Administered 2019-08-20 (×2): 0.5 mg via INTRAVENOUS

## 2019-08-20 MED ORDER — KETAMINE HCL 50 MG/ML IJ SOLN
INTRAMUSCULAR | Status: DC | PRN
  Administered 2019-08-20 (×2): 25 mg via INTRAVENOUS

## 2019-08-20 MED ORDER — HYDROMORPHONE HCL 2 MG PO TABS: 2.0000 mg | ORAL_TABLET | Freq: Four times a day (QID) | ORAL | 0 refills | Status: AC | PRN

## 2019-08-20 MED ORDER — CHLORHEXIDINE GLUCONATE CLOTH 2 % EX PADS: 6.0000 | MEDICATED_PAD | Freq: Once | CUTANEOUS | Status: DC

## 2019-08-20 MED ORDER — OXYCODONE HCL 5 MG PO TABS
5.0000 mg | ORAL_TABLET | Freq: Once | ORAL | Status: AC | PRN
  Administered 2019-08-20: 5 mg via ORAL

## 2019-08-20 MED ORDER — DEXMEDETOMIDINE HCL IN NACL 200 MCG/50ML IV SOLN
INTRAVENOUS | Status: DC | PRN
  Administered 2019-08-20: 12 ug via INTRAVENOUS

## 2019-08-20 MED ORDER — HYDROMORPHONE HCL 1 MG/ML IJ SOLN
INTRAMUSCULAR | Status: AC
  Filled 2019-08-20: qty 1

## 2019-08-20 MED ORDER — SUGAMMADEX SODIUM 200 MG/2ML IV SOLN
INTRAVENOUS | Status: DC | PRN
  Administered 2019-08-20: 200 mg via INTRAVENOUS

## 2019-08-20 MED ORDER — DEXAMETHASONE SODIUM PHOSPHATE 10 MG/ML IJ SOLN
INTRAMUSCULAR | Status: AC
  Filled 2019-08-20: qty 1

## 2019-08-20 MED ORDER — ROCURONIUM BROMIDE 50 MG/5ML IV SOLN
INTRAVENOUS | Status: AC
  Filled 2019-08-20: qty 1

## 2019-08-20 MED ORDER — MIDAZOLAM HCL 2 MG/2ML IJ SOLN
INTRAMUSCULAR | Status: AC
  Filled 2019-08-20: qty 2

## 2019-08-20 MED ORDER — MIDAZOLAM HCL 2 MG/2ML IJ SOLN
INTRAMUSCULAR | Status: DC | PRN
  Administered 2019-08-20: 2 mg via INTRAVENOUS

## 2019-08-20 MED ORDER — CEFAZOLIN SODIUM-DEXTROSE 2-4 GM/100ML-% IV SOLN
INTRAVENOUS | Status: AC
  Filled 2019-08-20: qty 100

## 2019-08-20 MED ORDER — PROPOFOL 10 MG/ML IV BOLUS
INTRAVENOUS | Status: DC | PRN
  Administered 2019-08-20: 250 mg via INTRAVENOUS

## 2019-08-20 MED ORDER — LIDOCAINE HCL (PF) 2 % IJ SOLN
INTRAMUSCULAR | Status: AC
  Filled 2019-08-20: qty 10

## 2019-08-20 MED ORDER — HYDROMORPHONE HCL 1 MG/ML IJ SOLN
INTRAMUSCULAR | Status: AC
  Administered 2019-08-20: 0.25 mg via INTRAVENOUS
  Filled 2019-08-20: qty 1

## 2019-08-20 MED ORDER — GLYCOPYRROLATE 0.2 MG/ML IJ SOLN
INTRAMUSCULAR | Status: DC | PRN
  Administered 2019-08-20: 0.2 mg via INTRAVENOUS

## 2019-08-20 MED ORDER — SUCCINYLCHOLINE CHLORIDE 20 MG/ML IJ SOLN
INTRAMUSCULAR | Status: DC | PRN
  Administered 2019-08-20: 100 mg via INTRAVENOUS

## 2019-08-20 MED ORDER — ONDANSETRON HCL 4 MG/2ML IJ SOLN
INTRAMUSCULAR | Status: AC
  Filled 2019-08-20: qty 2

## 2019-08-20 MED ORDER — FAMOTIDINE 20 MG PO TABS
ORAL_TABLET | ORAL | Status: AC
  Administered 2019-08-20: 20 mg via ORAL
  Filled 2019-08-20: qty 1

## 2019-08-20 MED ORDER — CEFAZOLIN SODIUM-DEXTROSE 2-4 GM/100ML-% IV SOLN
2.0000 g | INTRAVENOUS | Status: AC
  Administered 2019-08-20: 2 g via INTRAVENOUS

## 2019-08-20 MED ORDER — BUPIVACAINE HCL 0.5 % IJ SOLN
INTRAMUSCULAR | Status: DC | PRN
  Administered 2019-08-20: 30 mL

## 2019-08-20 MED ORDER — DEXAMETHASONE SODIUM PHOSPHATE 10 MG/ML IJ SOLN
INTRAMUSCULAR | Status: DC | PRN
  Administered 2019-08-20: 10 mg via INTRAVENOUS

## 2019-08-20 MED ORDER — SODIUM CHLORIDE 0.9 % IV SOLN
INTRAVENOUS | Status: DC
  Administered 2019-08-20: 08:00:00 via INTRAVENOUS

## 2019-08-20 MED ORDER — LIDOCAINE HCL (CARDIAC) PF 100 MG/5ML IV SOSY
PREFILLED_SYRINGE | INTRAVENOUS | Status: DC | PRN
  Administered 2019-08-20: 50 mg via INTRAVENOUS

## 2019-08-20 SURGICAL SUPPLY — 44 items
ADH SKN CLS APL DERMABOND .7 (GAUZE/BANDAGES/DRESSINGS) ×1
APL PRP STRL LF DISP 70% ISPRP (MISCELLANEOUS) ×1
BLADE SURG 15 STRL LF DISP TIS (BLADE) ×1 IMPLANT
BLADE SURG 15 STRL SS (BLADE) ×2
CANISTER SUCT 1200ML W/VALVE (MISCELLANEOUS) ×2 IMPLANT
CATH URET ROBINSON 16FR STRL (CATHETERS) ×1 IMPLANT
CHLORAPREP W/TINT 26 (MISCELLANEOUS) ×2 IMPLANT
CNTNR SPEC 2.5X3XGRAD LEK (MISCELLANEOUS)
CONT SPEC 4OZ STER OR WHT (MISCELLANEOUS)
CONT SPEC 4OZ STRL OR WHT (MISCELLANEOUS)
CONTAINER SPEC 2.5X3XGRAD LEK (MISCELLANEOUS) ×2 IMPLANT
DERMABOND ADVANCED (GAUZE/BANDAGES/DRESSINGS) ×1
DERMABOND ADVANCED .7 DNX12 (GAUZE/BANDAGES/DRESSINGS) IMPLANT
DRAIN CHEST DRY SUCT SGL (MISCELLANEOUS) ×1 IMPLANT
DRAPE CHEST BREAST 77X106 FENE (MISCELLANEOUS) ×1 IMPLANT
DRAPE INCISE IOBAN 66X45 STRL (DRAPES) ×2 IMPLANT
DRAPE LAPAROTOMY TRNSV 106X77 (MISCELLANEOUS) ×2 IMPLANT
DRAPE MAG INST 16X20 L/F (DRAPES) ×2 IMPLANT
DRSG TEGADERM 4X4.75 (GAUZE/BANDAGES/DRESSINGS) ×1 IMPLANT
ELECT BLADE 6 FLAT ULTRCLN (ELECTRODE) ×2 IMPLANT
ELECT REM PT RETURN 9FT ADLT (ELECTROSURGICAL) ×2
ELECTRODE REM PT RTRN 9FT ADLT (ELECTROSURGICAL) ×1 IMPLANT
GAUZE SPONGE 4X4 12PLY STRL (GAUZE/BANDAGES/DRESSINGS) ×2 IMPLANT
GLOVE SURG SYN 7.5  E (GLOVE) ×1
GLOVE SURG SYN 7.5 E (GLOVE) ×1 IMPLANT
GLOVE SURG SYN 7.5 PF PI (GLOVE) ×1 IMPLANT
GOWN STRL REUS W/ TWL LRG LVL3 (GOWN DISPOSABLE) ×3 IMPLANT
GOWN STRL REUS W/TWL LRG LVL3 (GOWN DISPOSABLE) ×6
HANDLE YANKAUER SUCT BULB TIP (MISCELLANEOUS) ×2 IMPLANT
KIT TURNOVER KIT A (KITS) ×2 IMPLANT
LABEL OR SOLS (LABEL) ×2 IMPLANT
NS IRRIG 1000ML POUR BTL (IV SOLUTION) ×2 IMPLANT
PACK BASIN MINOR ARMC (MISCELLANEOUS) ×2 IMPLANT
SPONGE KITTNER 5P (MISCELLANEOUS) ×2 IMPLANT
STRIP CLOSURE SKIN 1/2X4 (GAUZE/BANDAGES/DRESSINGS) ×1 IMPLANT
SUT MNCRL 4-0 (SUTURE) ×2
SUT MNCRL 4-0 27XMFL (SUTURE) ×1
SUT PROLENE 0 CT 1 30 (SUTURE) ×2 IMPLANT
SUT VIC AB 0 CT2 27 (SUTURE) ×2 IMPLANT
SUT VIC AB 2-0 CT2 27 (SUTURE) ×4 IMPLANT
SUT VIC AB 4-0 FS2 27 (SUTURE) ×2 IMPLANT
SUTURE MNCRL 4-0 27XMF (SUTURE) IMPLANT
SWABSTK COMLB BENZOIN TINCTURE (MISCELLANEOUS) ×1 IMPLANT
SYR 20ML LL LF (SYRINGE) ×2 IMPLANT

## 2019-08-20 NOTE — Anesthesia Preprocedure Evaluation (Signed)
Anesthesia Evaluation  Patient identified by MRN, date of birth, ID band Patient awake    Reviewed: Allergy & Precautions, H&P , NPO status , Patient's Chart, lab work & pertinent test results  History of Anesthesia Complications Negative for: history of anesthetic complications  Airway Mallampati: III  TM Distance: <3 FB Neck ROM: full    Dental  (+) Chipped, Poor Dentition   Pulmonary shortness of breath and with exertion, pneumonia, COPD, Current Smoker and Patient abstained from smoking.,           Cardiovascular Exercise Tolerance: Good hypertension, (-) angina(-) Past MI and (-) DOE negative cardio ROS       Neuro/Psych PSYCHIATRIC DISORDERS negative neurological ROS     GI/Hepatic negative GI ROS, Neg liver ROS, neg GERD  ,  Endo/Other  diabetes, Type 2, Insulin Dependent  Renal/GU Renal disease     Musculoskeletal   Abdominal   Peds  Hematology negative hematology ROS (+)   Anesthesia Other Findings Past Medical History: 04/29/2019: Alcohol abuse No date: Allergy No date: Anxiety No date: ARDS (adult respiratory distress syndrome) (HCC) No date: ARDS (adult respiratory distress syndrome) (HCC) No date: Bipolar disorder ([place]) No date: Borderline diabetes No date: Cataract No date: Chronic kidney disease No date: COPD (chronic obstructive pulmonary disease) (HCC)     Comment:  chronic cough and wheezing No date: Depression No date: Diabetes mellitus without complication (HCC) No date: Dizziness     Comment:  medication related No date: Dyspnea     Comment:  with exertion No date: Elevated lipids No date: Fatty liver No date: Hypercholesterolemia No date: Hypertension No date: Pancreatitis No date: Pneumonia     Comment:  in past No date: Pneumonia     Comment:  in past No date: Pre-diabetes     Comment:  diet controlled No date: Pulmonary embolism (HCC) No date: Schizoaffective disorder  ([place])  Past Surgical History: 03/26/2018: CATARACT EXTRACTION W/PHACO; Right     Comment:  Procedure: CATARACT EXTRACTION PHACO AND INTRAOCULAR               LENS PLACEMENT ([place]) RIGHT BORDERLINE DIABETIC;  Surgeon:              Leandrew Koyanagi, MD;  Location: [facility];              Service: Ophthalmology;  Laterality: Right; 04/23/2018: CATARACT EXTRACTION W/PHACO; Left     Comment:  Procedure: CATARACT EXTRACTION PHACO AND INTRAOCULAR               LENS PLACEMENT ([place])  LEFT BORDERLINE DIABETIC;  Surgeon:              Leandrew Koyanagi, MD;  Location: [facility];              Service: Ophthalmology;  Laterality: Left; 08/21/2017: COLONOSCOPY WITH PROPOFOL; N/A     Comment:  Procedure: COLONOSCOPY WITH PROPOFOL;  Surgeon: Manya Silvas, MD;  Location: [facility] ENDOSCOPY;  Service:               Endoscopy;  Laterality: N/A; No date: EYE SURGERY No date: HERNIA REPAIR     Comment:  inguinal and umbilical [id number]: UMBILICAL HERNIA REPAIR; N/A     Comment:  Procedure: OPEN HERNIA REPAIR UMBILICAL ADULT;  Surgeon:              Olean Ree, MD;  Location: [facility]  ORS;  Service:               General;  Laterality: N/A; No date: UPPER GI ENDOSCOPY     Reproductive/Obstetrics negative OB ROS                             Anesthesia Physical Anesthesia Plan  ASA: III  Anesthesia Plan: General ETT   Post-op Pain Management:    Induction: Intravenous  PONV Risk Score and Plan: Ondansetron, Dexamethasone, Midazolam and Treatment may vary due to age or medical condition  Airway Management Planned: Oral ETT  Additional Equipment:   Intra-op Plan:   Post-operative Plan: Extubation in OR  Informed Consent: I have reviewed the patients History and Physical, chart, labs and discussed the procedure including the risks, benefits and alternatives for the proposed anesthesia with the patient or authorized representative who  has indicated his/her understanding and acceptance.     Dental Advisory Given  Plan Discussed with: Anesthesiologist, CRNA and Surgeon  Anesthesia Plan Comments: (Patient consented for risks of anesthesia including but not limited to:  - adverse reactions to medications - damage to teeth, lips or other oral mucosa - sore throat or hoarseness - Damage to heart, brain, lungs or loss of life  Patient voiced understanding.)        Anesthesia Quick Evaluation

## 2019-08-20 NOTE — Anesthesia Post-op Follow-up Note (Signed)
Anesthesia QCDR form completed.        

## 2019-08-20 NOTE — Discharge Instructions (Signed)

## 2019-08-20 NOTE — Transfer of Care (Signed)
Immediate Anesthesia Transfer of Care Note  Patient: Shaun Odonnell  Procedure(s) Performed: removal of xyphoid process (N/A )  Patient Location: PACU  Anesthesia Type:General  Level of Consciousness: sedated  Airway & Oxygen Therapy: Patient Spontanous Breathing and Patient connected to face mask oxygen  Post-op Assessment: Report given to RN and Post -op Vital signs reviewed and stable  Post vital signs: Reviewed  Last Vitals:  Vitals Value Taken Time  BP 117/73 08/20/19 1026  Temp 36.3 C 08/20/19 1026  Pulse 63 08/20/19 1026  Resp 10 08/20/19 1026  SpO2 100 % 08/20/19 1026    Last Pain:  Vitals:   08/20/19 0805  TempSrc: Temporal  PainSc: 0-No pain         Complications: No apparent anesthesia complications

## 2019-08-20 NOTE — Anesthesia Postprocedure Evaluation (Signed)
Anesthesia Post Note  Patient: Shaun Odonnell  Procedure(s) Performed: removal of xyphoid process (N/A )  Patient location during evaluation: PACU Anesthesia Type: General Level of consciousness: awake and alert Pain management: pain level controlled Vital Signs Assessment: post-procedure vital signs reviewed and stable Respiratory status: spontaneous breathing, nonlabored ventilation, respiratory function stable and patient connected to nasal cannula oxygen Cardiovascular status: blood pressure returned to baseline and stable Postop Assessment: no apparent nausea or vomiting Anesthetic complications: no     Last Vitals:  Vitals:   08/20/19 1042 08/20/19 1111  BP: (!) 149/98 131/81  Pulse: 95 66  Resp: 15 12  Temp:    SpO2: 96% 91%    Last Pain:  Vitals:   08/20/19 1113  TempSrc:   PainSc: 5                  Precious Haws Shanaya Schneck

## 2019-08-20 NOTE — Op Note (Signed)
  08/20/2019  10:25 AM  PATIENT:  Shaun Odonnell  53 y.o. male  PRE-OPERATIVE DIAGNOSIS: xyphodynia  POST-OPERATIVE DIAGNOSIS: Same  PROCEDURE: Excision of xiphoid process  SURGEON:  Surgeon(s) and Role:    * Nestor Lewandowsky, MD - Primary    * Olean Ree, MD - Assisting  ASSISTANTS: Dr. Olean Ree MD  ANESTHESIA: General  INDICATIONS FOR PROCEDURE this patient is a 53 year old man who is had an enlarging xiphoid process over the last several years.  He describes significant pain associated with the protuberant xiphoid process.  Indeed on physical exam the xiphoid process is markedly displaced anteriorly and at least some observers have noticed some bruising around it.  The patient states it is extremely painful when he lies on his stomach or because of its location he injures it.  For these reasons he was offered excision.  DICTATION: The patient was brought to the operating suite placed in the supine position.  General endotracheal anesthesia was given.  The patient was prepped and draped in usual sterile fashion.  A 6 cm skin incision was made overlying the xiphoid process extending onto the linea alba for short distance.  The linea alba was opened and the xiphoid process was isolated from the surrounding tissues.  Using a rongeur the xiphoid was removed in its entirety.  Hemostasis was complete.  The wounds were irrigated.  The linea alba was then closed with a 0 Prolene as a running suture.  The subcutaneous tissues were closed with 2-0 Vicryl and the skin with 4-0 Monocryl.  Dermabond was applied.  The patient was awakened and taken to the recovery room in stable condition.   Nestor Lewandowsky, MD

## 2019-08-20 NOTE — Anesthesia Procedure Notes (Signed)
Procedure Name: Intubation Performed by: Raymond Bhardwaj, CRNA Pre-anesthesia Checklist: Patient identified, Patient being monitored, Timeout performed, Emergency Drugs available and Suction available Patient Re-evaluated:Patient Re-evaluated prior to induction Oxygen Delivery Method: Circle system utilized Preoxygenation: Pre-oxygenation with 100% oxygen Induction Type: IV induction Ventilation: Mask ventilation without difficulty Laryngoscope Size: McGraph and 4 Grade View: Grade I Tube type: Oral Tube size: 7.5 mm Number of attempts: 1 Airway Equipment and Method: Stylet,  Video-laryngoscopy and LTA kit utilized Placement Confirmation: ETT inserted through vocal cords under direct vision,  positive ETCO2 and breath sounds checked- equal and bilateral Secured at: 22 cm Tube secured with: Tape Dental Injury: Teeth and Oropharynx as per pre-operative assessment        

## 2019-08-20 NOTE — Interval H&P Note (Signed)
History and Physical Interval Note:  08/20/2019 8:30 AM  Shaun Odonnell  has presented today for surgery, with the diagnosis of xyphoid process.  The various methods of treatment have been discussed with the patient and family. After consideration of risks, benefits and other options for treatment, the patient has consented to  Procedure(s): removal of xyphoid process (N/A) as a surgical intervention.  The patient's history has been reviewed, patient examined, no change in status, stable for surgery.  I have reviewed the patient's chart and labs.  Questions were answered to the patient's satisfaction.     Nestor Lewandowsky

## 2019-08-21 ENCOUNTER — Telehealth: Payer: Self-pay

## 2019-08-21 LAB — SURGICAL PATHOLOGY

## 2019-08-21 NOTE — Telephone Encounter (Signed)
Patient called saying that he had surgery yesterday, and he has been off of his diabetes medication 2 days prior.   He reports that he started back on his regular dose of Metformin (500mg  2 tablets BID), along with 10 units of insulin yesterday. For dinner, he had elbow macaroni and a half of bagel. When he woke up this morning around 4:30 am, his FBS was 399. He reports that he had 2 glasses of water and around 5:50am, his blood sugar was 412.   He then fixes a cup of black coffee, and rechecks blood sugar and his reading was 391. Now, at 8:30 is sugar is 422.   Patient is wanting to know what can he do to get his blood sugars down? He has only had Metformin 1000mg  along with his other morning meds. Please advise. Thanks!

## 2019-08-21 NOTE — Telephone Encounter (Signed)
Patient called back to find out status of his message.  States now that he forgot to tell you that he ate 2 BBQ sandwichs before checking the glucose.  Had pt check again and at 9:40 it is 370.  Advised patient that between not being on medicine 1 day, surgery, meds recently being changed, eating habits this morning, pain med, med during surgery, all these things probably are contributing.  Re-assured him that at this level he was not in emergent danger of stroke or DM coma (which is what he was worried about).  However, told him we would call him back after you were able to review message. Thanks

## 2019-08-21 NOTE — Telephone Encounter (Signed)
Patient was advised and states that his levels are coming down after speaking to someone earlier.

## 2019-08-21 NOTE — Telephone Encounter (Signed)
Agree that patient should resume typical meds and significantly decrease carb intake.  Also push fluids.  I suspect this level will continue to come down through the day.  It is not emergent.

## 2019-08-24 ENCOUNTER — Telehealth: Payer: Self-pay

## 2019-08-24 IMAGING — US ULTRASOUND ABDOMEN LIMITED
1 series · 14 of 25 positions shown · non-contrast
Comparison: 01/11/2017

CLINICAL DATA: Right upper quadrant pain.

EXAM:
ULTRASOUND ABDOMEN LIMITED RIGHT UPPER QUADRANT

[Series 1: ultrasound abdomen limited · 0.20mm/px · 14 of 59 slices shown]
[im 1/59]
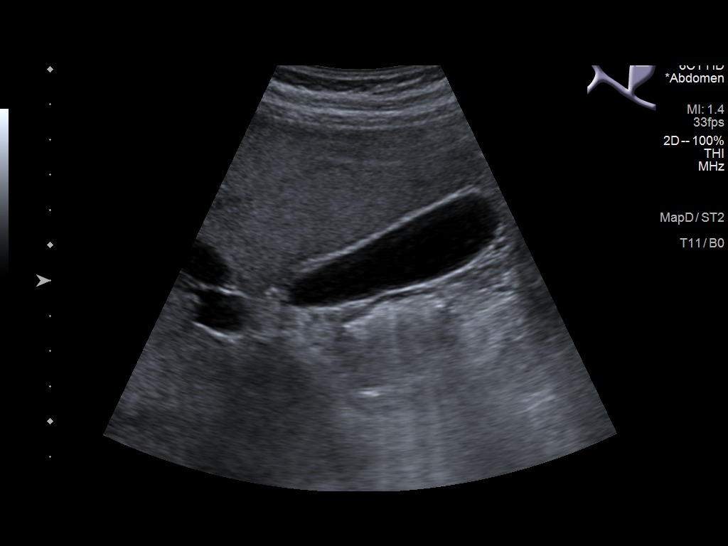
[im 5/59]
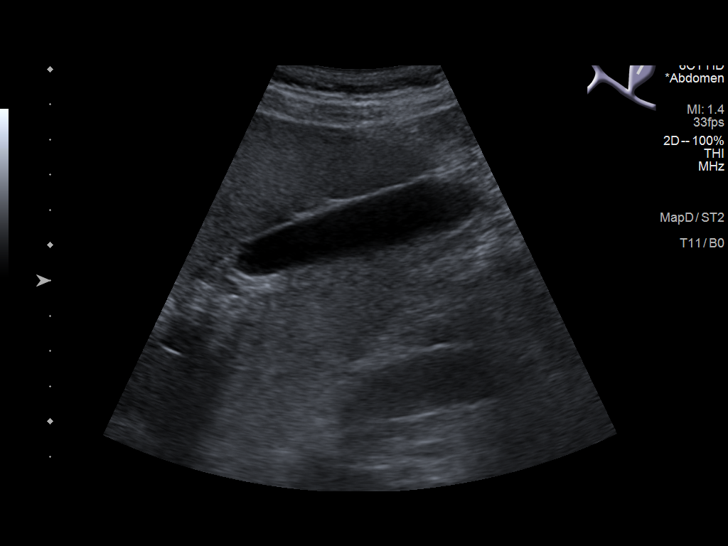
[im 10/59]
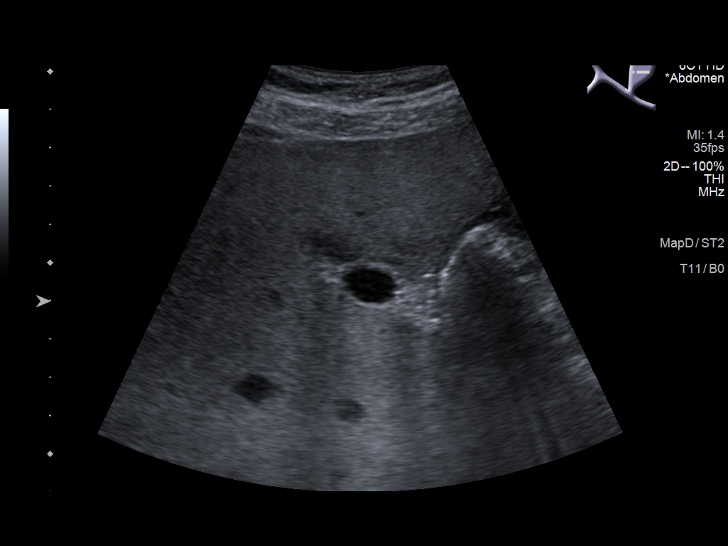
[im 15/59]
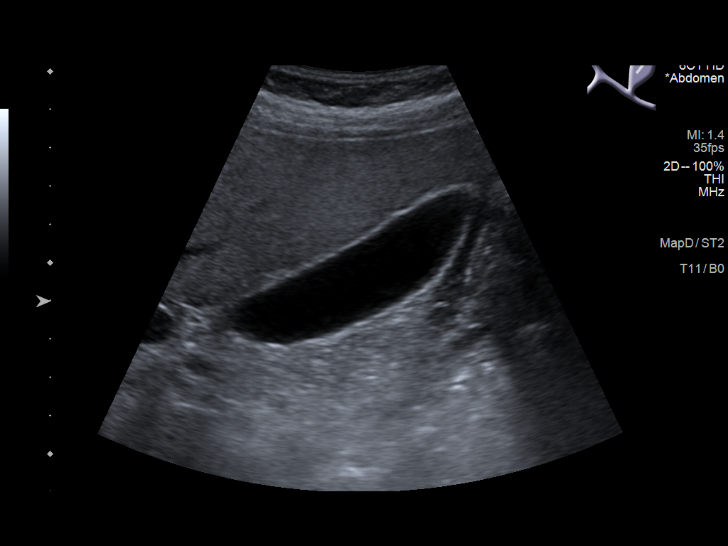
[im 20/59]
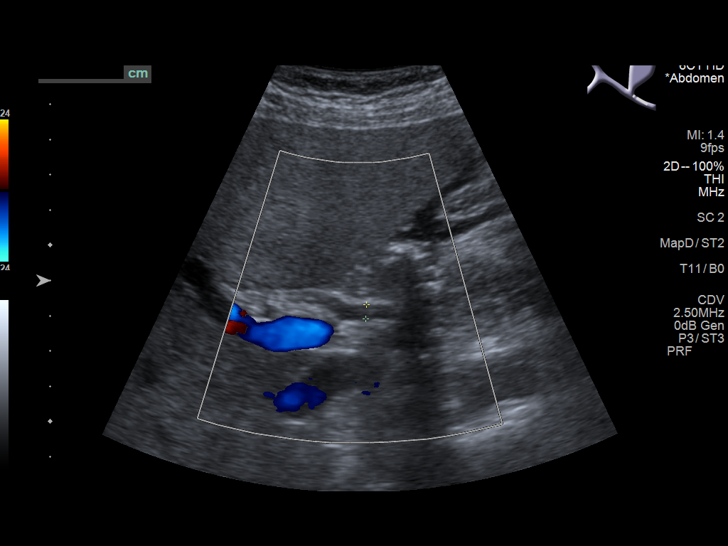
[im 22/59]
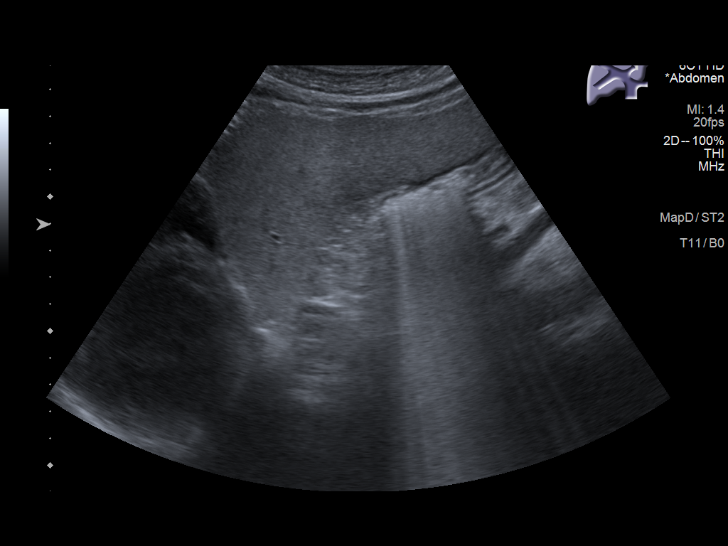
[im 27/59]
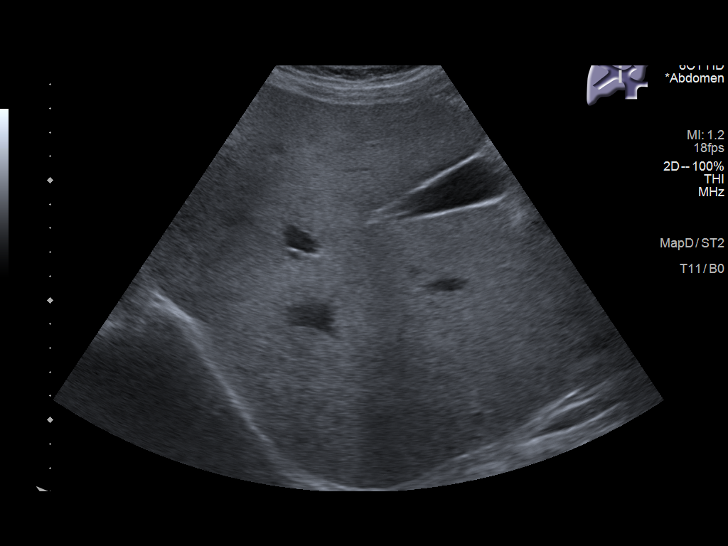
[im 32/59]
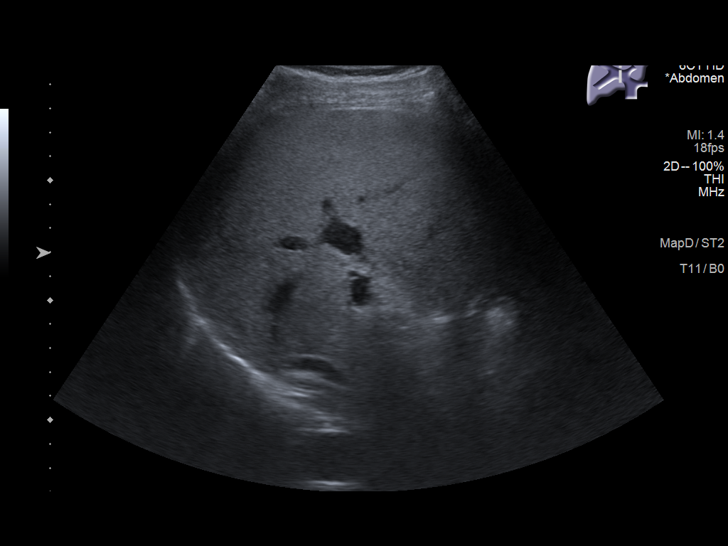
[im 37/59]
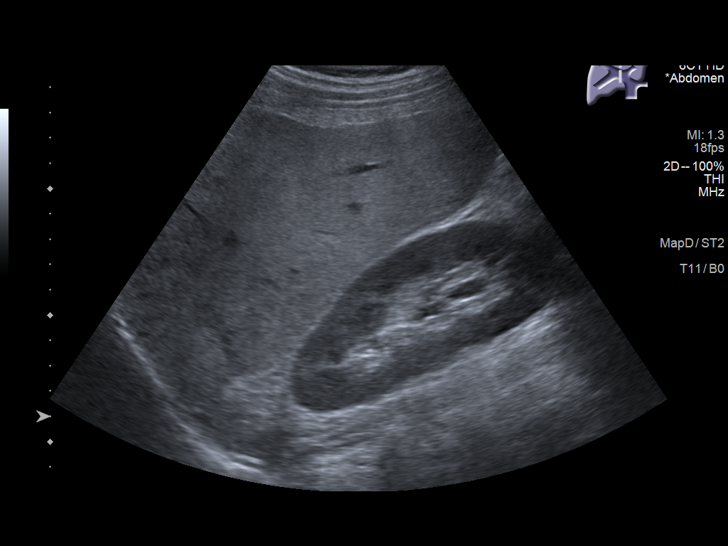
[im 39/59]
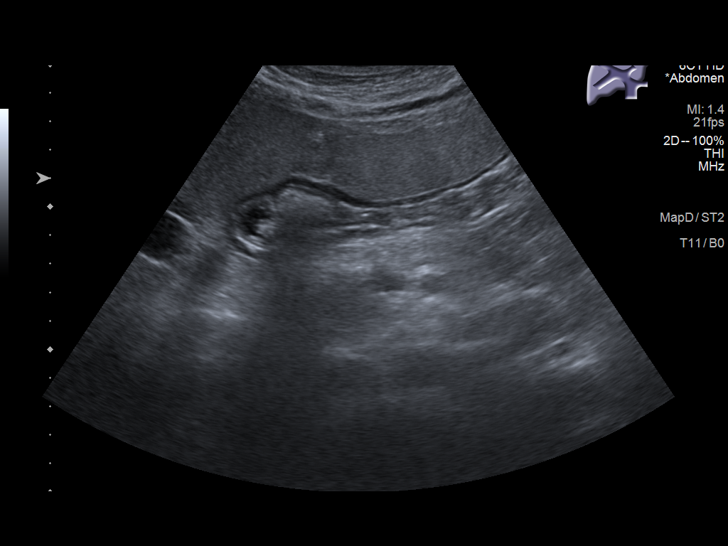
[im 44/59]
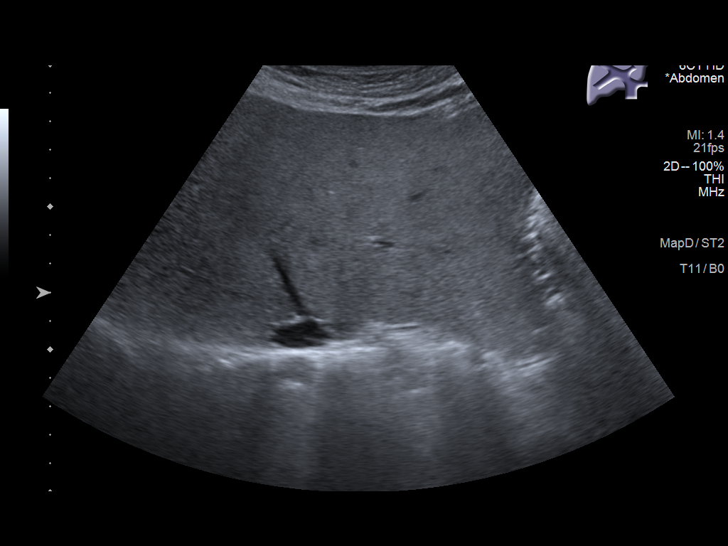
[im 49/59]
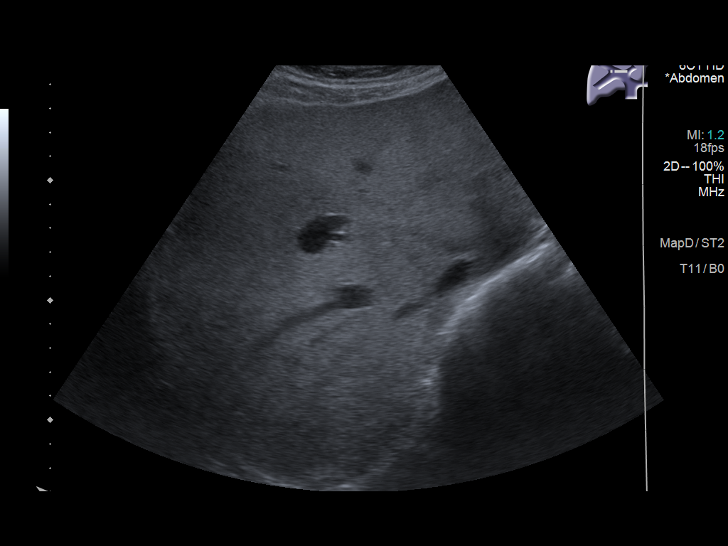
[im 54/59]
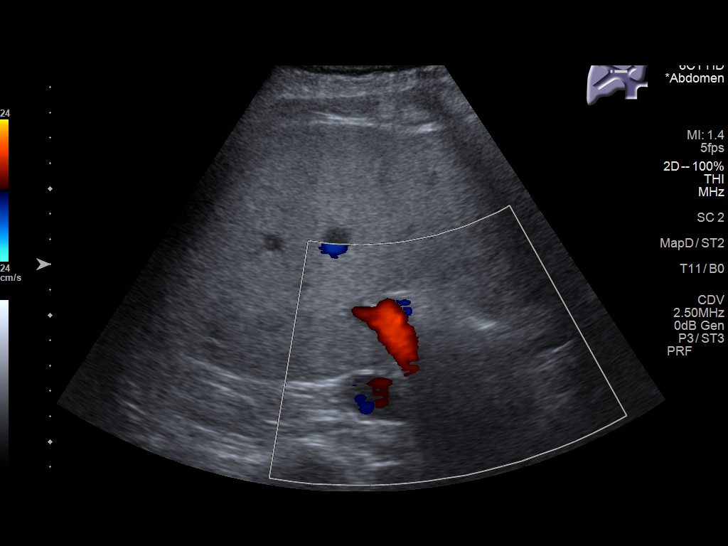
[im 59/59]
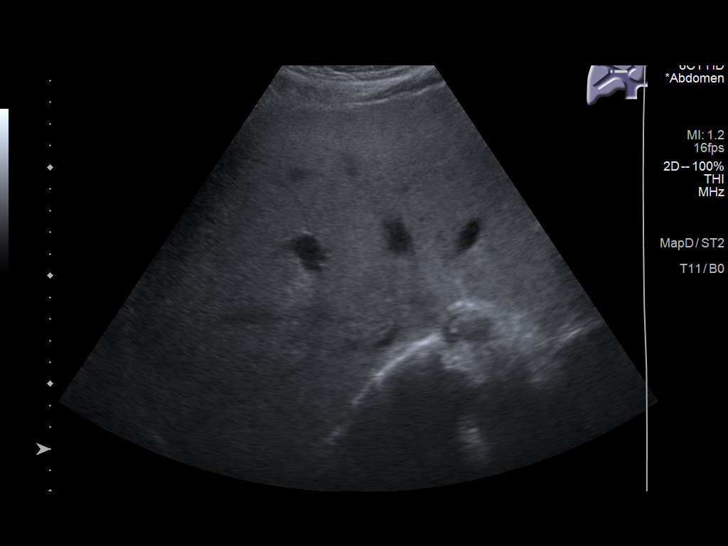

[14 of 25 positions shown; findings below may reference images not displayed]

FINDINGS: Gallbladder:

No gallstones or wall thickening visualized. No sonographic Murphy
sign noted by sonographer.

Common bile duct:

Diameter: 4 mm

Liver:

Increased echogenicity compatible with fatty deposition. No focal
abnormality. Portal vein is patent on color Doppler imaging with
normal direction of blood flow towards the liver.
IMPRESSION: 1. Imaging features compatible with hepatic steatosis. Otherwise
unremarkable.

## 2019-08-24 NOTE — Telephone Encounter (Signed)
Patient called and would like to know if he can get a refill of his prescription medication. He has been taking his Ibuprofen 600 mg also. Patient instructed that he may also use a heating pad for comfort.

## 2019-08-26 ENCOUNTER — Other Ambulatory Visit: Payer: Self-pay | Admitting: Physician Assistant

## 2019-08-26 IMAGING — DX PORTABLE CHEST - 1 VIEW
1 series · 1 of 1 positions shown · non-contrast
Comparison: 03/28/2019

CLINICAL DATA: Cough

EXAM:
PORTABLE CHEST 1 VIEW

[chest ap]
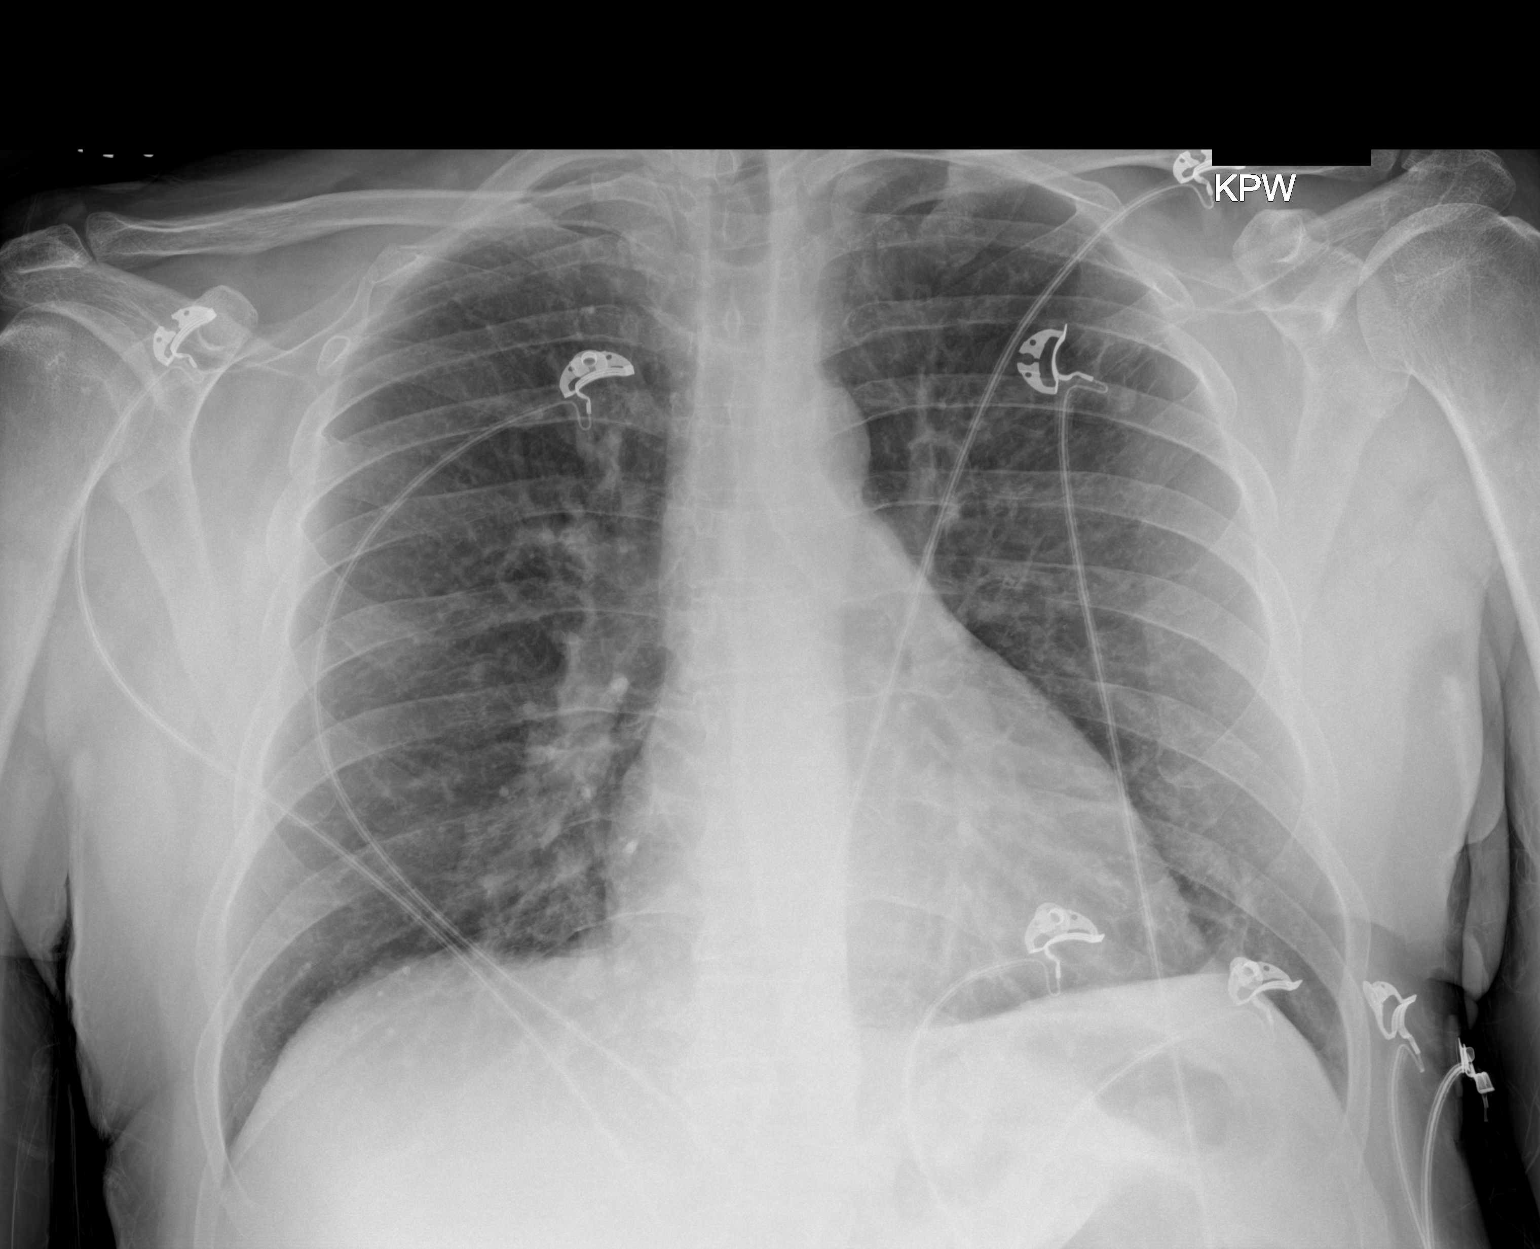

[1 of 1 positions shown; findings below may reference images not displayed]

FINDINGS: The heart size and mediastinal contours are within normal limits.
Both lungs are clear. The visualized skeletal structures are
unremarkable.
IMPRESSION: No active disease.

## 2019-08-26 MED ORDER — HYDROMORPHONE HCL 2 MG PO TABS: 2.0000 mg | ORAL_TABLET | Freq: Two times a day (BID) | ORAL | 0 refills | Status: AC | PRN

## 2019-08-28 ENCOUNTER — Ambulatory Visit: Payer: Self-pay

## 2019-08-28 ENCOUNTER — Encounter: Payer: Self-pay | Admitting: Cardiothoracic Surgery

## 2019-08-28 ENCOUNTER — Other Ambulatory Visit: Payer: Self-pay

## 2019-08-28 ENCOUNTER — Ambulatory Visit (INDEPENDENT_AMBULATORY_CARE_PROVIDER_SITE_OTHER): Payer: Self-pay | Admitting: Cardiothoracic Surgery

## 2019-08-28 VITALS — BP 124/74 | HR 66 | Temp 97.5°F | Ht 68.0 in | Wt 181.4 lb

## 2019-08-28 DIAGNOSIS — R0789 Other chest pain: Secondary | ICD-10-CM

## 2019-08-28 NOTE — Telephone Encounter (Signed)
Returned call to patient who has question about pain medication. He was curious about how the pain medication he got in the hospital IV. I was explained that IV medications are usually given in smaller mg doses but more frequently for pain control and safety. He was assured that his PO medication dose was most likely prescribed with the knowlege of how his pain was controled in the hospital. Patent was urged to contact his surgeon for a better explanation. He verbalized understanding and states it was just a curiosity.  He was told to take medication as prescribed. He verbalized understanding of all information.  Reason for Disposition . Caller has medication question only, adult not sick, and triager answers question  Answer Assessment - Initial Assessment Questions 1.   NAME of MEDICATION: "What medicine are you calling about?"     dilaudid 2.   QUESTION: "What is your question?"     Would the IV dose be different than the pill form 3.   PRESCRIBING HCP: "Who prescribed it?" Reason: if prescribed by specialist, call should be referred to that group.     hospital 4. SYMPTOMS: "Do you have any symptoms?"     No pain is controled 5. SEVERITY: If symptoms are present, ask "Are they mild, moderate or severe?"     no 6.  PREGNANCY:  "Is there any chance that you are pregnant?" "When was your last menstrual period?"     N/A  Protocols used: MEDICATION QUESTION CALL-A-AH

## 2019-08-28 NOTE — Progress Notes (Signed)
  Patient ID: Shaun Odonnell, male   DOB: 1966-08-09, 53 y.o.   MRN: [id number]  HISTORY: Overall he is quite pleased with the results of his surgery.  He has no specific complaints today.   Vitals:   08/28/19 0904  BP: 124/74  Pulse: 66  Temp: (!) 97.5 F (36.4 C)  SpO2: 95%     EXAM:   His wounds are clean dry and intact.  The Dermabond is still present.  There is no erythema.  His abdominal exam is completely unremarkable.  I do not see any bony protuberance on exam.   ASSESSMENT: Status post removal xiphoid process pathology reviewed with patient.  It shows no untoward findings   PLAN:   He will follow-up with [place] as needed.    Nestor Lewandowsky, MD

## 2019-08-28 NOTE — Patient Instructions (Addendum)
Follow-up with our office as needed.  Please call and ask to speak with a nurse if you develop questions or concerns.   GENERAL POST-OPERATIVE PATIENT INSTRUCTIONS   WOUND CARE INSTRUCTIONS:  Keep a dry clean dressing on the wound if there is drainage. The initial bandage may be removed after 24 hours.  Once the wound has quit draining you may leave it open to air.  If clothing rubs against the wound or causes irritation and the wound is not draining you may cover it with a dry dressing during the daytime.  Try to keep the wound dry and avoid ointments on the wound unless directed to do so.  If the wound becomes bright red and painful or starts to drain infected material that is not clear, please contact your physician immediately.  If the wound is mildly pink and has a thick firm ridge underneath it, this is normal, and is referred to as a healing ridge.  This will resolve over the next 4-6 weeks.  BATHING: You may shower if you have been informed of this by your surgeon. However, Please do not submerge in a tub, hot tub, or pool until incisions are completely sealed or have been told by your surgeon that you may do so.  DIET:  You may eat any foods that you can tolerate.  It is a good idea to eat a high fiber diet and take in plenty of fluids to prevent constipation.  If you do become constipated you may want to take a mild laxative or take ducolax tablets on a daily basis until your bowel habits are regular.  Constipation can be very uncomfortable, along with straining, after recent surgery.  ACTIVITY:  You are encouraged to cough and deep breath or use your incentive spirometer if you were given one, every 15-30 minutes when awake.  This will help prevent respiratory complications and low grade fevers post-operatively if you had a general anesthetic.  You may want to hug a pillow when coughing and sneezing to add additional support to  the surgical area, if you had abdominal or chest surgery, which will decrease pain during these times.  You are encouraged to walk and engage in light activity for the next two weeks.  You should not lift more than 20 pounds for 6 weeks after surgery as it could put you at increased risk for complications.  Twenty pounds is roughly equivalent to a plastic bag of groceries. At that time- Listen to your body when lifting, if you have pain when lifting, stop and then try again in a few days. Soreness after doing exercises or activities of daily living is normal as you get back in to your normal routine.  MEDICATIONS:  Try to take narcotic medications and anti-inflammatory medications, such as tylenol, ibuprofen, naprosyn, etc., with food.  This will minimize stomach upset from the medication.  Should you develop nausea and vomiting from the pain medication, or develop a rash, please discontinue the medication and contact your physician.  You should not drive, make important decisions, or operate machinery when taking narcotic pain medication.  SUNBLOCK Use sun block to incision area over the next year if this area will be exposed to sun. This helps decrease scarring and will allow you avoid a permanent darkened area over your incision.  QUESTIONS:  Please feel free to call our office if you have any questions, and we will be glad to assist you. [phone]

## 2019-08-30 IMAGING — CT CT ABDOMEN AND PELVIS WITH CONTRAST
2 of 5 series · 15 of 46 positions shown, 17 images · IV contrast (omnipaque)
Comparison: CT the abdomen and pelvis 12/31/2018.

CLINICAL DATA: 52-year-old male with history of worsening
periumbilical pain and swelling for the past 2 months. Recent
history of vomiting.

EXAM:
CT ABDOMEN AND PELVIS WITH CONTRAST
TECHNIQUE: Multidetector CT imaging of the abdomen and pelvis was performed
using the standard protocol following bolus administration of
intravenous contrast.
CONTRAST:  100mL OMNIPAQUE IOHEXOL 300 MG/ML  SOLN

[Series 2: abd pelvis · axial · 0.78mm/px · z∈[-1521,-1121]mm · 12 of 92 slices shown, 14 images]
[im 6/92  soft-tissue]
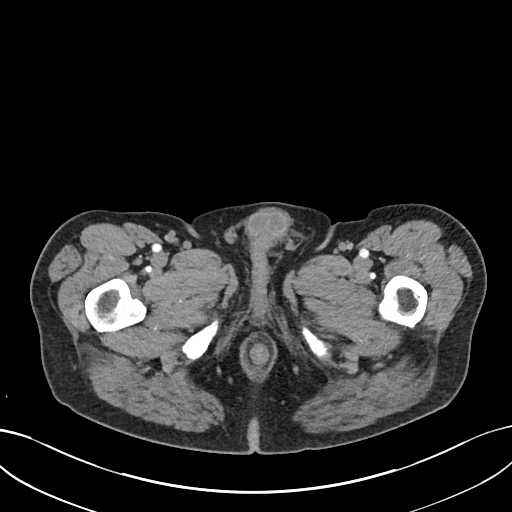
[im 6/92  bone]
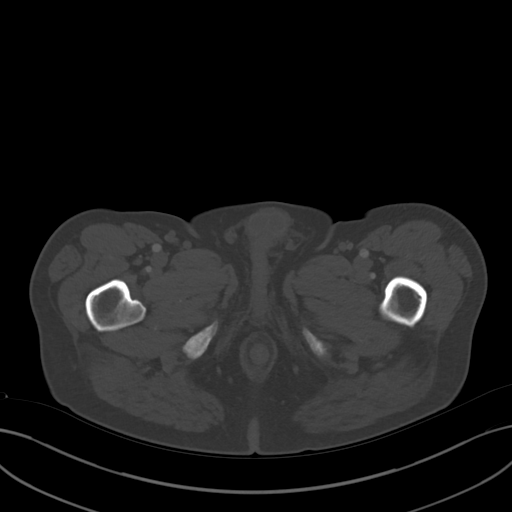
[im 12/92  soft-tissue]
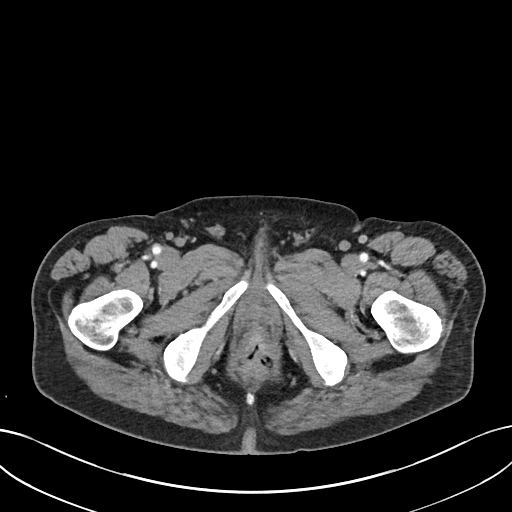
[im 23/92  soft-tissue]
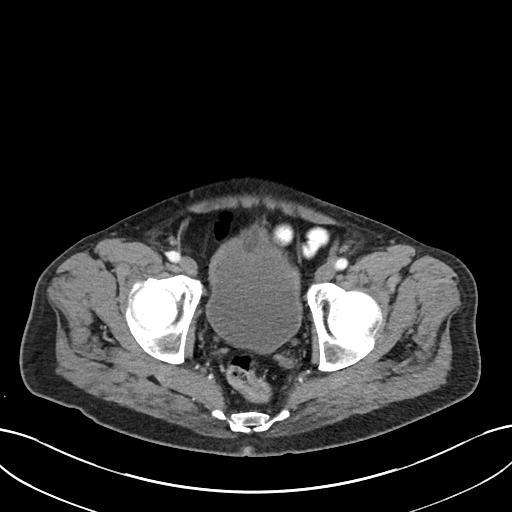
[im 29/92  soft-tissue]
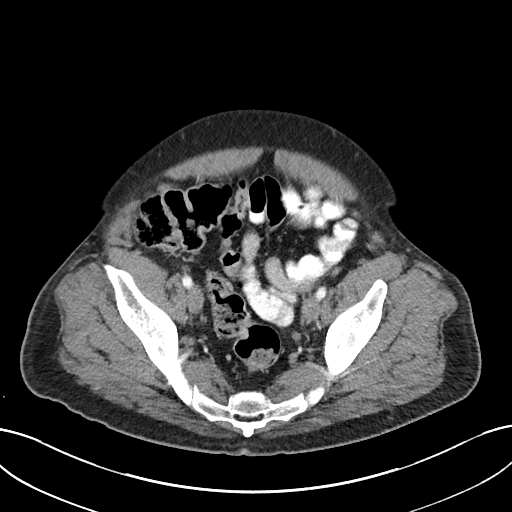
[im 35/92  soft-tissue]
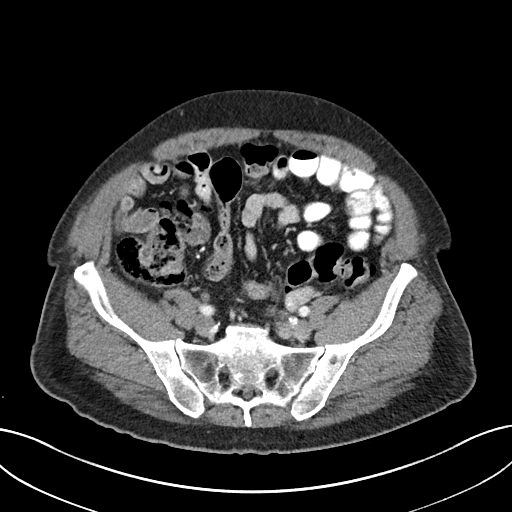
[im 40/92  soft-tissue]
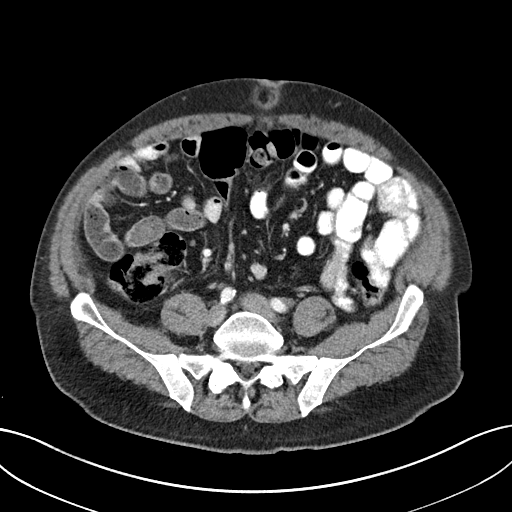
[im 52/92  soft-tissue]
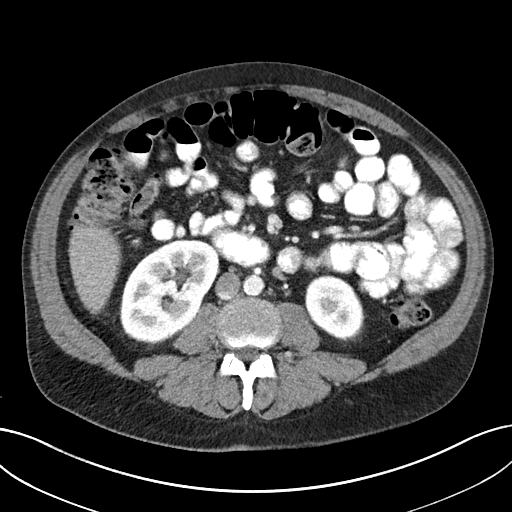
[im 57/92  soft-tissue]
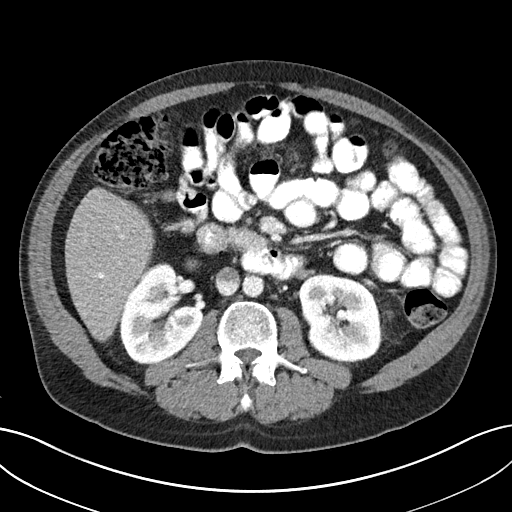
[im 63/92  soft-tissue]
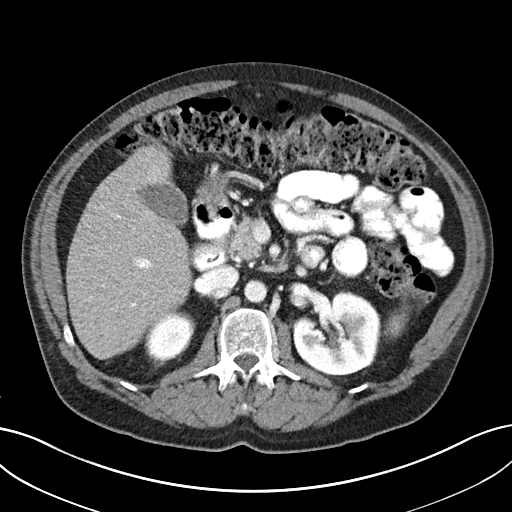
[im 63/92  bone]
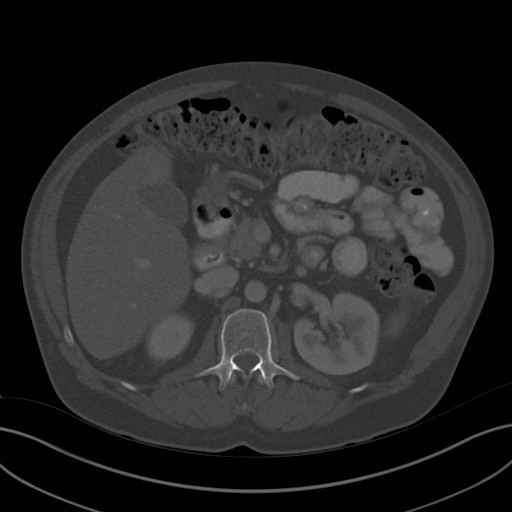
[im 69/92  soft-tissue]
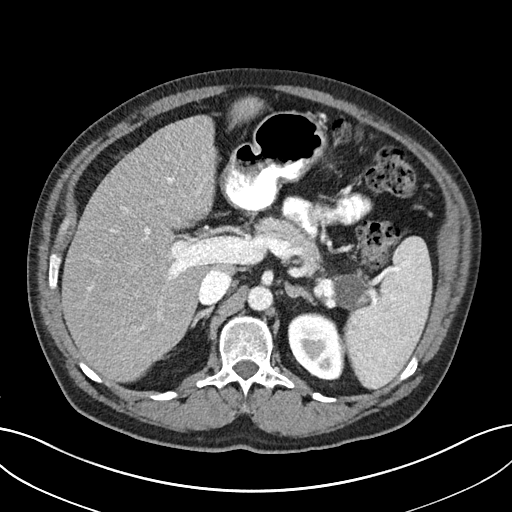
[im 80/92  soft-tissue]
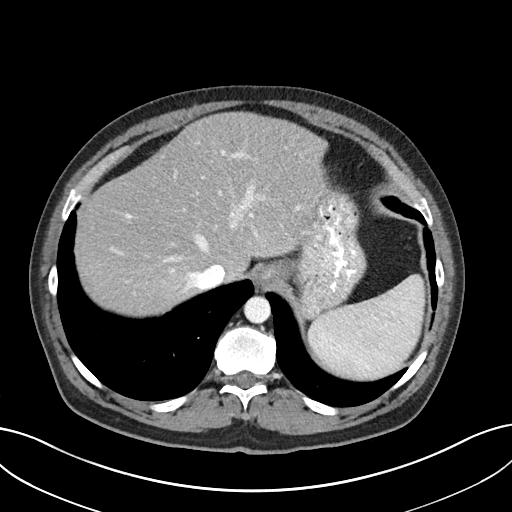
[im 86/92  soft-tissue]
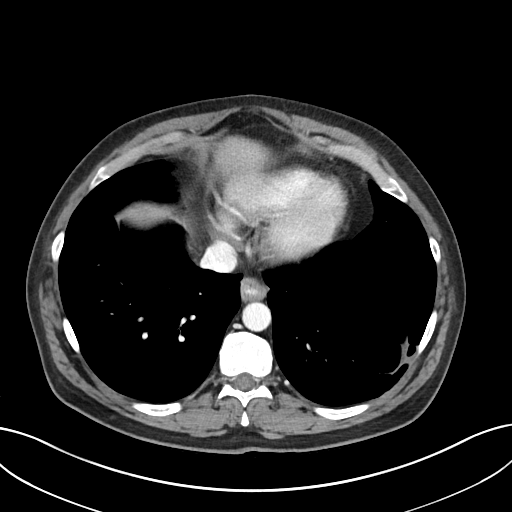

[Series 4: coronal abd pelvis · coronal · 0.74mm/px · 3 of 157 slices shown]
[im 53/157  soft-tissue]
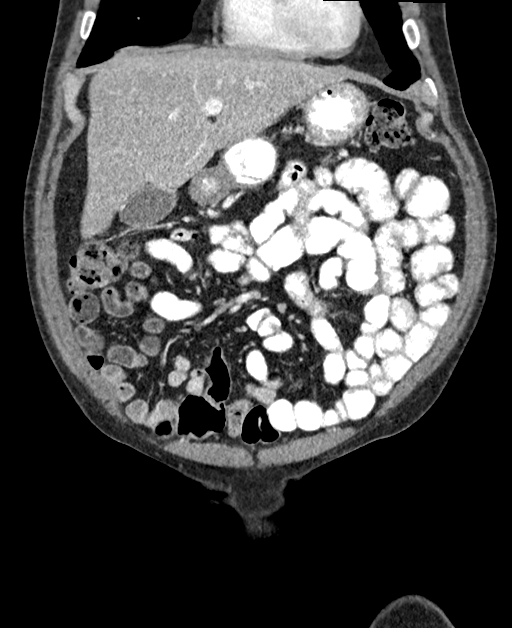
[im 70/157  soft-tissue]
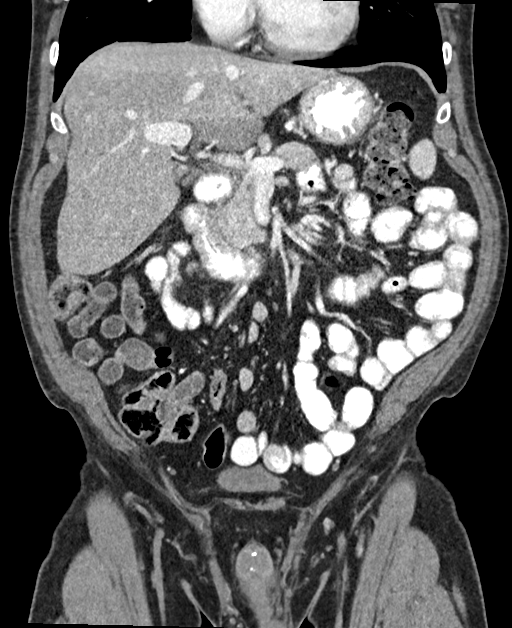
[im 87/157  soft-tissue]
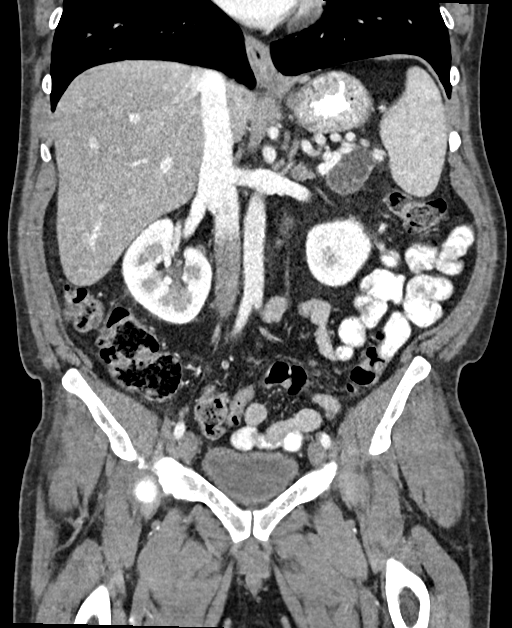

[15 of 46 positions shown; findings below may reference images not displayed]

FINDINGS: Lower chest: Scarring in the lower lobes of the lungs bilaterally.

Hepatobiliary: No suspicious cystic or solid hepatic lesions. No
intra or extrahepatic biliary ductal dilatation. Gallbladder is
normal in appearance.

Pancreas: The pancreatic and peripancreatic inflammatory changes
seen on the prior examination have resolved. There continues to be a
well-defined centrally low-attenuation lesion in the tail of the
pancreas which currently measures 2.3 x 3.7 cm (axial image 26 of
series 2) which is slightly larger than the prior examination
(previously 1.4 x 2.9 cm on 12/31/2018), most likely to represent a
pancreatic pseudocyst. No other pancreatic mass. No pancreatic
ductal dilatation.

Spleen: Unremarkable.

Adrenals/Urinary Tract: Bilateral kidneys and adrenal glands are
normal in appearance. No hydroureteronephrosis. Urinary bladder is
normal in appearance.

Stomach/Bowel: Normal appearance of the stomach. No pathologic
dilatation of small bowel or colon. The appendix is not confidently
identified and may be surgically absent. Regardless, there are no
inflammatory changes noted adjacent to the cecum to suggest the
presence of an acute appendicitis at this time.

Vascular/Lymphatic: Aortic atherosclerosis, without evidence of
aneurysm or dissection in the abdominal or pelvic vasculature. No
lymphadenopathy noted in the abdomen or pelvis.

Reproductive: Prostate gland and seminal vesicles are unremarkable
in appearance.

Other: No significant volume of ascites. No pneumoperitoneum. Small
umbilical hernia containing only omental fat.

Musculoskeletal: There are no aggressive appearing lytic or blastic
lesions noted in the visualized portions of the skeleton.
IMPRESSION: 1. No acute findings are noted in the abdomen or pelvis to account
for the patient's symptoms.
2. There is a small umbilical hernia containing only omental fat. No
associated bowel incarceration or obstruction at this time.
3. Small cystic appearing lesion in the tail of the pancreas
slightly increased in size compared to the prior study. Given the
patient's prior history of pancreatitis, this is strongly favored to
represent a pancreatic pseudocyst. Repeat pancreatic protocol CT or
MRI of the abdomen with and without IV gadolinium with MRCP is
recommended in 6 months to ensure the stability of this finding.
This recommendation follows ACR consensus guidelines: Management of
Incidental Pancreatic Cysts: A White Paper of the ACR Incidental
Findings Committee. [HOSPITAL] 6017;[DATE].
4. Aortic atherosclerosis.

## 2019-08-31 ENCOUNTER — Telehealth: Payer: Self-pay

## 2019-08-31 ENCOUNTER — Telehealth: Payer: Self-pay | Admitting: *Deleted

## 2019-08-31 ENCOUNTER — Telehealth: Payer: Self-pay | Admitting: Physician Assistant
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
## 2019-08-31 NOTE — Telephone Encounter (Signed)
Patient called back and wanted to see about getting another prescription and if we have changed our minds on giving him dilaudid. I did advise him about the previous note.
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
## 2019-09-01 ENCOUNTER — Telehealth: Payer: Self-pay | Admitting: *Deleted

## 2019-09-01 ENCOUNTER — Telehealth: Payer: Self-pay | Admitting: Cardiothoracic Surgery

## 2019-09-01 NOTE — Telephone Encounter (Signed)
Message left for the patient letting him know that some bleeding when the surgical glue comes off is normal. He may place a bandage over the area if he has any further bleeding.

## 2019-09-01 NOTE — Telephone Encounter (Signed)
Patient is calling again said he listen to the voicemail and still has questions please call patient and advise.

## 2019-09-01 NOTE — Telephone Encounter (Signed)
Patient called and stated that he was bleeding from his incision site this morning around 4am and his surgical glue has came off, he stated that Its not bleeding now as of this morning. He wants to know what he should do  Surgery 08/20/19 Dr.Oaks removal of xyphoid process

## 2019-09-01 NOTE — Telephone Encounter (Signed)
Patient called back asking about getting another refill on dilaudid, I advise him that we can not prescribe him any due to state and federal guidelines. He has called multiple times today demanding the refill.
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
## 2019-09-01 NOTE — Telephone Encounter (Signed)
Patient has called again today requesting a refill on Dilaudid. He stated his pharmacist told him to call and that we should provide him a refill due to the police report being filed.  Patient stated if he was to have another surgery would he be prescribed narcotics?   When speaking with him he was persistent on requesting  Prescription for dilaudid.  I let him know that we would not refill his dilaudid. I offered to send a referral to the pain clinic and he denied saying they would not do anything for him. He continue to ask for the prescription and I told him I could have our director call him and he said no need to go that far. He only wants his Dilaudid refilled. I ask patient if he had any concerns regarding his incision and was told no. He was strongly encouraged to not keep calling our office only to ask and demand his Dilaudid. I will call his pharmacy to let them know. I spoke with [facility] and he stated the patient has called there multiple times asking to get refills on his Dilaudid. Patient was asked to call if he has any concerns regarding his incision otherwise stop calling regarding refills on Dilaudid when he has been told  multiple times we will not provide refills.

## 2019-09-01 NOTE — Telephone Encounter (Signed)
LVMTRC 

## 2019-09-02 ENCOUNTER — Telehealth: Payer: Self-pay

## 2019-09-02 ENCOUNTER — Emergency Department
Admission: EM | Admit: 2019-09-02 | Discharge: 2019-09-02 | Disposition: A | Discharge status: Home / Self Care | Payer: Medicare Other | Attending: Emergency Medicine | Admitting: Emergency Medicine

## 2019-09-02 ENCOUNTER — Other Ambulatory Visit: Payer: Self-pay

## 2019-09-02 ENCOUNTER — Encounter: Payer: Self-pay | Admitting: Emergency Medicine

## 2019-09-02 DIAGNOSIS — Y658 Other specified misadventures during surgical and medical care: Secondary | ICD-10-CM | POA: Diagnosis not present

## 2019-09-02 DIAGNOSIS — T819XXA Unspecified complication of procedure, initial encounter: Secondary | ICD-10-CM | POA: Insufficient documentation

## 2019-09-02 DIAGNOSIS — Z5320 Procedure and treatment not carried out because of patient's decision for unspecified reasons: Secondary | ICD-10-CM | POA: Insufficient documentation

## 2019-09-02 LAB — COMPREHENSIVE METABOLIC PANEL
ALT: 18 U/L (ref 0–44)
AST: 15 U/L (ref 15–41)
Albumin: 4.3 g/dL (ref 3.5–5.0)
Alkaline Phosphatase: 100 U/L (ref 38–126)
Anion gap: 15 (ref 5–15)
BUN: <5 mg/dL — ABNORMAL LOW (ref 6–20)
CO2: 25 mmol/L (ref 22–32)
Calcium: 9.2 mg/dL (ref 8.9–10.3)
Chloride: 93 mmol/L — ABNORMAL LOW (ref 98–111)
Creatinine, Ser: 0.5 mg/dL — ABNORMAL LOW (ref 0.61–1.24)
GFR calc Af Amer: >60 mL/min (ref >60)
GFR calc non Af Amer: >60 mL/min (ref >60)
Glucose, Bld: 299 mg/dL — ABNORMAL HIGH (ref 70–99)
Potassium: 4 mmol/L (ref 3.5–5.1)
Sodium: 133 mmol/L — ABNORMAL LOW (ref 135–145)
Total Bilirubin: 0.6 mg/dL (ref 0.3–1.2)
Total Protein: 7.9 g/dL (ref 6.5–8.1)

## 2019-09-02 LAB — CBC WITH DIFFERENTIAL/PLATELET
Abs Immature Granulocytes: 0.14 10*3/uL — ABNORMAL HIGH (ref 0.00–0.07)
Basophils Absolute: 0.1 10*3/uL (ref 0.0–0.1)
Basophils Relative: 1 %
Eosinophils Absolute: 0.2 10*3/uL (ref 0.0–0.5)
Eosinophils Relative: 1 %
HCT: 48.8 % (ref 39.0–52.0)
Hemoglobin: 16.7 g/dL (ref 13.0–17.0)
Immature Granulocytes: 1 %
Lymphocytes Relative: 22 %
Lymphs Abs: 2.7 10*3/uL (ref 0.7–4.0)
MCH: 32.2 pg (ref 26.0–34.0)
MCHC: 34.2 g/dL (ref 30.0–36.0)
MCV: 94.2 fL (ref 80.0–100.0)
Monocytes Absolute: 0.6 10*3/uL (ref 0.1–1.0)
Monocytes Relative: 5 %
Neutro Abs: 8.5 10*3/uL — ABNORMAL HIGH (ref 1.7–7.7)
Neutrophils Relative %: 70 %
Platelets: 235 10*3/uL (ref 150–400)
RBC: 5.18 MIL/uL (ref 4.22–5.81)
RDW: 13.1 % (ref 11.5–15.5)
WBC: 12.2 10*3/uL — ABNORMAL HIGH (ref 4.0–10.5)
nRBC: 0 % (ref 0.0–0.2)

## 2019-09-02 NOTE — ED Triage Notes (Signed)
Pt in via POV, reports having procedure to xyphoid process done 10/15, presents today with ongoing pain to incision site and drainage.  Redness noted to incision site, site intact, no drainage noted at this time.  NAD noted.

## 2019-09-02 NOTE — Telephone Encounter (Signed)
Patient called the office again. I spoke with Dr. Genevive Bi and the recommendation was that [name] go to the ED. That recommendation was conveyed to Mr. Slover. He informed us that he would be heading to the ED, and would call us tomorrow on 10/29.

## 2019-09-02 NOTE — Telephone Encounter (Signed)
Patient called the office. Patient has called continuously over the last few days. I needed to speak with him to find a resolution to this situation. Patient explained that he has experienced significant bleeding post-operation. I informed the patient that he could either be seen in our clinic by Dr. Genevive Bi or head to the Emergency Department. After a few back and forths, the patient did not feel the need to head to the ED. Patient elected to set up a post-op visit with Dr. Genevive Bi on 10/30.20.

## 2019-09-02 NOTE — ED Notes (Signed)
Pt reports he is going to leave. Alert and oriented, NAD.  Instructed to return if needed.

## 2019-09-02 NOTE — ED Notes (Signed)
Pt up to desk asking about weight.  Glue from surgery has come off and has had some drainage. Offered to put dressing on and pt states " I want them to see how much drainage, I don't want to cover it up".  Explained to pt they will see the dressing but he does not want anything over wound.

## 2019-09-03 ENCOUNTER — Encounter: Payer: Self-pay | Admitting: Emergency Medicine

## 2019-09-03 ENCOUNTER — Telehealth: Payer: Self-pay | Admitting: *Deleted

## 2019-09-03 ENCOUNTER — Ambulatory Visit: Payer: Medicare Other | Admitting: Physician Assistant

## 2019-09-03 ENCOUNTER — Ambulatory Visit
Admission: EM | Admit: 2019-09-03 | Discharge: 2019-09-03 | Disposition: A | Discharge status: Home / Self Care | Payer: Medicare Other

## 2019-09-03 ENCOUNTER — Ambulatory Visit: Payer: Self-pay | Admitting: *Deleted

## 2019-09-03 ENCOUNTER — Other Ambulatory Visit: Payer: Self-pay

## 2019-09-03 DIAGNOSIS — G8918 Other acute postprocedural pain: Secondary | ICD-10-CM | POA: Diagnosis not present

## 2019-09-03 DIAGNOSIS — L24A9 Irritant contact dermatitis due friction or contact with other specified body fluids: Secondary | ICD-10-CM

## 2019-09-03 DIAGNOSIS — T148XXA Other injury of unspecified body region, initial encounter: Secondary | ICD-10-CM | POA: Diagnosis not present

## 2019-09-03 DIAGNOSIS — Z765 Malingerer [conscious simulation]: Secondary | ICD-10-CM

## 2019-09-03 NOTE — Telephone Encounter (Signed)
Patient calls with questions regarding a post surgical incision site. Xyphoid process removed 10/15 now having clear, slightly blood tinged thin drainage from site. Reported minimal redness and is only at the incision, not spreading. Reports he called surgeon's office and was told drainage is okay. Seen at the ED last night but did not stay for evaluation by physician due to long wail he said. No fever. Reviewed signs he should contact the surgeon for including pus like drainage/warmth or redness at the site/any red streaking from the incision/fever above 100.5/odor from incision/pain/gaping or opening of the incision/increased blood drainage.Asked if okay to use antibiotic cream at the site. Informed yes. Stated he understood all discussed and would folow-up with his surgeon if needed. Answer Assessment - Initial Assessment Questions 1. SYMPTOM: "What's the main symptom you're concerned about?" (e.g., redness, pain, drainage)     Drainage at incision. Surgery on 10/15th 2. ONSET: "When did   Drainage start?"     4-5 days ago. 3. SURGERY: "What surgery was performed?"     Xyphoid process removed. 4. DATE of SURGERY: "When was surgery performed?"      08/20/19 5. INCISION SITE: "Where is the incision located?"      Upper abdomen. 6. REDNESS: "Is there any redness at the incision site?" If yes, ask: "How wide across is the redness?" (Inches, centimeters)      Small amount of redness around incision site.  7. PAIN: "Is there any pain?" If so, ask: "How bad is it?"  (Scale 1-10; or mild, moderate, severe)     no 8. BLEEDING: "Is there any bleeding?" If so, ask: "How much?" and "Where?"     serosanguinous  9. DRAINAGE: "Is there any drainage from the incision site?" If yes, ask: "What color and how much?" (e.g., red, cloudy, pus; drops, teaspoon)     10. FEVER: "Do you have a fever?" If so, ask: "What is your temperature, how was it measured, and when did it start?"       Looking at his tee-handful of  drainage. No fever 11. OTHER SYMPTOMS: "Do you have any other symptoms?" (e.g., shaking chills, weakness, rash elsewhere on body)none of these  Protocols used: POST-OP INCISION [facility]

## 2019-09-03 NOTE — ED Provider Notes (Signed)
[name], [name]   Name: Shaun Odonnell DOB: Jan 20, 1966 MRN: [id number] CSN: [id number] PCP: Trinna Post, PA-C  Arrival date and time:  09/03/19 1356  Chief Complaint:  Post-op Problem   NOTE: Prior to seeing the patient today, I have reviewed the triage nursing documentation and vital signs. Clinical staff has updated patient's PMH/PSHx, current medication list, and drug allergies/intolerances to ensure comprehensive history available to assist in medical decision making.   History:   HPI: Shaun Odonnell is a 53 y.o. male who presents today with complaints of post operative pain and concern for surgical site infection. Patient is POD #14 following a xiphoidectomy (08/20/2019) performed by Dr. Nestor Lewandowsky. Patient has not experienced any post operative fevers. His main concern today seems to be centered around his pain. Surgical notes reviewed that suggest an uncomplicated surgical course overall. Patient advises that he has been doing well until about 3 to 4 days ago when "the glue came off of his incision and his pain increased".  Patient was discharged home on hydromorphone 2 mg tablets (#20) following his surgery. Patient contacted surgical practice for a refill of the hydromorphone, which was provided (#20) on 08/26/2019.    Timeline leading to today's visit, as per the available note in Epic, as follows:   Patient was seen in postop consult by Dr. Genevive Bi on 08/28/2019.  At that time, he was doing well. Adhesive skin closure noted to be intact and wound healing as expected with no concerning findings.  Plan at that time was for patient to follow-up on a as needed basis.   He had placed a call over the weekend (10/24 or 10/25) to the nurse line (PCP's office) where he advised them that he had a surgery on his "scrotum" and needed pain medication. Refill was denied and patient was advised to file an official police report.    Patient contacted PCP's office on 08/31/2019 advising  that his dilaudid was stolen. He requested a refill of the stolen narcotic medication, which was denied. He was advised to take APAP and/or IBU as needed for pain. Patient placed several calls that day to ask if they had "changed their mind".    Call placed to surgeon's office on 09/01/2019 to advised that surgical adhesive had come off and that his would was bleeding. Reassurance was provided. Patient was instructed on wound care. Patient placed several calls to practice on this date demanding hydromorphone prescription. During one of the calls patient stated that a pharmacist told him to call that the practice 'should refill his medication since it was stolen". Refills denied. Pain management referral was offered, however patient declined citing that "they wouldn't do anything for him". In the absence of concerns related to his surgical site, patient was asked to stop calling for narcotic refills.     Patient continued to contact office on 09/02/2019. He was referred to the practice administrator in efforts to resolve his problem. Patient complained of "significant post-operative bleeding". Patient was offered a follow up appointment with Dr. Genevive Bi versus immediate evaluation in the [facility] emergency department. Patient elected to see Dr. Genevive Bi on 09/04/2019. Despite conversation and offered appointment, patient continued to call the office with concerns and requests for medications. Practice administrator spoke with Dr. Genevive Bi who advised that patient proceed to the emergency department for evaluation. Directives were communicated to the patient and he verbalized understanding and agreement.    Patient proceeded to the emergency department on 09/02/2019. Patient was noted to be  afebrile with a normal HR and BP. Routine labs were performed while patient in triage. WBC minimally elevated at 12.2 K/uL. Hemoglobin 16.7, hematocrit 48.8, and platelets 235,000. Na slightly low at 133 mmol/L. Glucose elevated at 299  mg/dL. Renal and hepatic function normal. After approximately 90 minutes of waiting in the ER, patient decided to LWBS, citing plans to contact surgeon's office the following day.    Patient contacted surgery on 09/03/2019 to advise that he left the ER without being seen. He made mention of a retained supply of amoxicillin that he had from a dental procedure that he had started to take due to peri-wound erythema. He cancelled his follow up appointment with Dr. Genevive Bi for 09/04/2019. Follow up call with triage RN later that day found that patient was doing well and had no concerns with regards to his surgical incision.   Past Medical History:  Diagnosis Date   Alcohol abuse 04/29/2019   Allergy    Anxiety    ARDS (adult respiratory distress syndrome) (HCC)    ARDS (adult respiratory distress syndrome) (HCC)    Bipolar disorder (HCC)    Borderline diabetes    Cataract    Chronic kidney disease    COPD (chronic obstructive pulmonary disease) (HCC)    chronic cough and wheezing   Depression    Diabetes mellitus without complication (HCC)    Dizziness    medication related   Dyspnea    with exertion   Elevated lipids    Fatty liver    Hypercholesterolemia    Hypertension    Pancreatitis    Pneumonia    in past   Pneumonia    in past   Pre-diabetes    diet controlled   Pulmonary embolism (HCC)    Schizoaffective disorder ([place])     Past Surgical History:  Procedure Laterality Date   CATARACT EXTRACTION W/PHACO Right 03/26/2018   Procedure: CATARACT EXTRACTION PHACO AND INTRAOCULAR LENS PLACEMENT ([place]) RIGHT BORDERLINE DIABETIC;  Surgeon: Leandrew Koyanagi, MD;  Location: [facility];  Service: Ophthalmology;  Laterality: Right;   CATARACT EXTRACTION W/PHACO Left 04/23/2018   Procedure: CATARACT EXTRACTION PHACO AND INTRAOCULAR LENS PLACEMENT ([place])  LEFT BORDERLINE DIABETIC;  Surgeon: Leandrew Koyanagi, MD;  Location: [place];   Service: Ophthalmology;  Laterality: Left;   COLONOSCOPY WITH PROPOFOL N/A 08/21/2017   Procedure: COLONOSCOPY WITH PROPOFOL;  Surgeon: Manya Silvas, MD;  Location: [facility] ENDOSCOPY;  Service: Endoscopy;  Laterality: N/A;   EYE SURGERY     HERNIA REPAIR     inguinal and umbilical   RIB RESECTION N/A 08/20/2019   Procedure: removal of xyphoid process;  Surgeon: Nestor Lewandowsky, MD;  Location: [facility] ORS;  Service: Thoracic;  Laterality: N/A;   UMBILICAL HERNIA REPAIR N/A 06/22/2019   Procedure: OPEN HERNIA REPAIR UMBILICAL ADULT;  Surgeon: Olean Ree, MD;  Location: [facility] ORS;  Service: General;  Laterality: N/A;   UPPER GI ENDOSCOPY      Family History  Problem Relation Age of Onset   Hyperlipidemia Mother    Hypertension Father    Dementia Maternal Grandmother    Heart disease Maternal Grandfather    Heart disease Paternal Grandmother    Stroke Paternal Grandfather     Social History   Tobacco Use   Smoking status: Current Every Day Smoker    Packs/day: 1.50    Years: 30.00    Pack years: 45.00    Types: Cigarettes   Smokeless tobacco: Former Systems developer    Types:  Snuff  Substance Use Topics   Alcohol use: Yes    Alcohol/week: 6.0 standard drinks    Types: 6 Cans of beer per week    Comment: every 2 days   Drug use: Not Currently    Patient Active Problem List   Diagnosis Date Noted   Xyphoidalgia    Type 2 diabetes mellitus with hyperglycemia, without long-term current use of insulin ([place]) 04/29/2019   Chronic obstructive pulmonary disease ([place]) 04/29/2019   Alcohol abuse 04/29/2019   Bipolar depression ([place]) [id number]   Umbilical hernia without obstruction and without gangrene 04/23/2019   Acute pancreatitis 12/31/2018   Pancreatitis 07/02/2018    Home Medications:    Current Meds  Medication Sig   ALPRAZolam (XANAX) 0.5 MG tablet Take 0.5 mg by mouth 4 (four) times daily. Patient takes when wakes up(~0600)/ 1000/ 1600/ 2000   Insulin  Glargine (LANTUS) 100 UNIT/ML Solostar Pen Inject 10 Units into the skin at bedtime.    metFORMIN (GLUCOPHAGE-XR) 500 MG 24 hr tablet Take 2 tablets (1,000 mg total) by mouth 2 (two) times daily.   OLANZapine (ZYPREXA) 15 MG tablet Take 15 mg by mouth at bedtime. At 2000   ondansetron (ZOFRAN) 8 MG tablet Take 1 tablet (8 mg total) by mouth every 8 (eight) hours as needed for nausea or vomiting.   sertraline (ZOLOFT) 100 MG tablet Take 100 mg by mouth daily.    simvastatin (ZOCOR) 40 MG tablet TAKE 1 TABLET BY MOUTH ONCE DAILY (Patient taking differently: 40 mg daily at 6 PM. )    Allergies:   Fentanyl, Morphine and related, Klonopin [clonazepam], Pimozide, and Stelazine [trifluoperazine]  Review of Systems (ROS): Review of Systems  Constitutional: Negative for chills and fever.  Respiratory: Negative for cough and shortness of breath.   Cardiovascular: Negative for chest pain and palpitations.  Gastrointestinal: Negative for abdominal pain, diarrhea, nausea and vomiting.  Skin: Positive for wound (surgical).  Psychiatric/Behavioral: The patient is nervous/anxious.        PMH (+) anxiety, bipolar disorder, and schizoaffective disorder. (+) h/o ETOH abuse.   All other systems reviewed and are negative.    Vital Signs: Today's Vitals   09/03/19 1415 09/03/19 1416 09/03/19 1420 09/03/19 1456  BP:   126/80   Pulse:   76   Resp:   18   Temp:   98.3 F (36.8 C)   TempSrc:   Oral   SpO2:   96%   Weight:  180 lb (81.6 kg)    Height:  5\' 8"  (1.727 m)    PainSc: 5    5     Physical Exam: Physical Exam  Constitutional: He is oriented to person, place, and time and well-developed, well-nourished, and in no distress.  HENT:  Head: Normocephalic and atraumatic.  Mouth/Throat: Mucous membranes are normal.  Eyes: Pupils are equal, round, and reactive to light.  Neck: Normal range of motion. Neck supple.  Cardiovascular: Normal rate, regular rhythm, normal heart sounds and intact  distal pulses. Exam reveals no gallop and no friction rub.  No murmur heard. Pulmonary/Chest: Effort normal and breath sounds normal. No respiratory distress. He has no wheezes. He has no rales. He exhibits tenderness (overlying surgical wound).    Status post xiphoidectomy on 08/20/2019. Complains of drainage, pain, and concern for SSI. See attached medical photographs.   Abdominal: Soft. Bowel sounds are normal. He exhibits no distension. There is no abdominal tenderness.  Neurological: He is alert and oriented to person, place, and  time. He displays tremor (essential tremors (mild) to BILATERAL hands). Gait normal.  Skin: Skin is warm and dry. No rash noted.  Psychiatric: Memory normal. His mood appears anxious. He is agitated.  Aberrant drug behaviors in clinic. Frustrated when denied Rx for controlled substances.  Nursing note and vitals reviewed.        Urgent Care Treatments / Results:   LABS: Admission on 09/02/2019, Discharged on 09/02/2019  Component Date Value Ref Range Status   Sodium 09/02/2019 133* 135 - 145 mmol/L Final   Potassium 09/02/2019 4.0  3.5 - 5.1 mmol/L Final   Chloride 09/02/2019 93* 98 - 111 mmol/L Final   CO2 09/02/2019 25  22 - 32 mmol/L Final   Glucose, Bld 09/02/2019 299* 70 - 99 mg/dL Final   BUN 09/02/2019 <5* 6 - 20 mg/dL Final   Creatinine, Ser 09/02/2019 0.50* 0.61 - 1.24 mg/dL Final   Calcium 09/02/2019 9.2  8.9 - 10.3 mg/dL Final   Total Protein 09/02/2019 7.9  6.5 - 8.1 g/dL Final   Albumin 09/02/2019 4.3  3.5 - 5.0 g/dL Final   AST 09/02/2019 15  15 - 41 U/L Final   ALT 09/02/2019 18  0 - 44 U/L Final   Alkaline Phosphatase 09/02/2019 100  38 - 126 U/L Final   Total Bilirubin 09/02/2019 0.6  0.3 - 1.2 mg/dL Final   GFR calc non Af Amer 09/02/2019 >60  >60 mL/min Final   GFR calc Af Amer 09/02/2019 >60  >60 mL/min Final   Anion gap 09/02/2019 15  5 - 15 Final   Performed at [facility], [address]  [place], [place] [address]   WBC 09/02/2019 12.2* 4.0 - 10.5 K/uL Final   RBC 09/02/2019 5.18  4.22 - 5.81 MIL/uL Final   Hemoglobin 09/02/2019 16.7  13.0 - 17.0 g/dL Final   HCT 09/02/2019 48.8  39.0 - 52.0 % Final   MCV 09/02/2019 94.2  80.0 - 100.0 fL Final   MCH 09/02/2019 32.2  26.0 - 34.0 pg Final   MCHC 09/02/2019 34.2  30.0 - 36.0 g/dL Final   RDW 09/02/2019 13.1  11.5 - 15.5 % Final   Platelets 09/02/2019 235  150 - 400 K/uL Final   nRBC 09/02/2019 0.0  0.0 - 0.2 % Final   Neutrophils Relative % 09/02/2019 70  % Final   Neutro Abs 09/02/2019 8.5* 1.7 - 7.7 K/uL Final   Lymphocytes Relative 09/02/2019 22  % Final   Lymphs Abs 09/02/2019 2.7  0.7 - 4.0 K/uL Final   Monocytes Relative 09/02/2019 5  % Final   Monocytes Absolute 09/02/2019 0.6  0.1 - 1.0 K/uL Final   Eosinophils Relative 09/02/2019 1  % Final   Eosinophils Absolute 09/02/2019 0.2  0.0 - 0.5 K/uL Final   Basophils Relative 09/02/2019 1  % Final   Basophils Absolute 09/02/2019 0.1  0.0 - 0.1 K/uL Final   Immature Granulocytes 09/02/2019 1  % Final   Abs Immature Granulocytes 09/02/2019 0.14* 0.00 - 0.07 K/uL Final   Performed at [facility], [address] [place], [place] [address]    EKG: -None  RADIOLOGY: No results found.  PROCEDURES: Procedures  MEDICATIONS RECEIVED THIS VISIT: Medications - No data to display  PERTINENT CLINICAL COURSE NOTES/UPDATES:   Initial Impression / Assessment and Plan / Urgent Care Course:  Pertinent labs & imaging results that were available during my care of the patient were personally reviewed by me and considered in my medical  decision making (see lab/imaging section of note for values and interpretations).  Shaun Odonnell is a 53 y.o. male who presents to [facility] today with complaints of Post-op Problem   Patient is well appearing overall in clinic today. He does not appear to be in any acute distress. Presenting symptoms (see  HPI) and exam as documented above. Wound well approximated with no signs of infection. There is mild peri-incision erythema, however this is expected in the immediate post-operative course. Patient presents with a white T-shirt on today with dried wound drainage. Initially, he advised that the drainage was from today only, however after probing and asking several clarifying questions, I determined that the drainage (see medical photograph above) represented drainage that has occurred over the course of the last 3-4 days. Patient denies that the drainage has been purulent or malodorous. He notes that the drainage has always been serosanguinous. He has not experienced any post-operative fevers.   Patient presents today requesting pain medication today. He has contacted his PCP, his surgeon, and has been to the emergency department in efforts to obtain refills of his pain medication. Patient states, "I just need to get a little bit more pain medication. At least 5 days worth of hydromorphone". I had a long conversation with patient about his presenting complaints, especially the fact that his reported pain is disproportionate to his exam. Conversation witnessed by clinic staff Gregary Cromer, RN). I advised patient that the urgent care was not the appropriate setting to manage a potential post operative complication, especially seeing whereas he was concerned about SSI. Discussed potential complications and need for further evaluation by the performing surgeon. Patient states, "we had a falling out. I have cancelled my appointment with him for tomorrow". Reviewed labs that patient had done in the ER as not being overly concerning for infection. Offered to set patient up with another surgeon to discuss post-surgical course and concerns for intractable pain and possibility of "internal infection". Patient states, "there is no body around here to see. I have been dismissed from [facility]. They won't see me anymore".  Patient with several concerning aberrant behaviors:   He demands certain narcotics by name - "hydromorphone is the only thing that works for me".   He has called/visited several practices requesting narcotics - PCP, surgery, ED, and urgent care.   He bargains for "something stronger" when offered conservative interventions - "if you can't do hydromorphone, can I get some oxycodone or hydro"?  He speaks ill of providers who have denied him prescriptions.  He reports lost/stolen opioid prescriptions.  Prior to seeing the patient today, I reached out to Freda Jackson (surgery) to discuss patient. PA advised that they remain more willing to see patient as previously scheduled tomorrow if he is willing to come into the office. After a great deal of discussion and encouragement, patient was convinced that it was in his best interest to allow his performing surgeon to re-evaluate him to discuss his concerns for infection and continued pain as he is currently POD #14. I advised him that it would be up to the surgeon to determine his course of treatment. Reassurance was provided that his wound had a normal appearance and that based on his labs there was little to no concern for post-operative infection.   Patient if he would be receiving pain medication today. Due to the aforementioned concerns, I advised him that I would not be able to provide him with prescriptions for controlled substances today. He was  encouraged to take concurrent doses of APAP 650 mg and IBU 400 mg every 6-8 hours PRN pain. Patient states, "Ok. I will do that tonight. Is Dr. Genevive Bi going to give me hydromorphone tomorrow"? I reiterated that individual treatment courses were at the discretion of the provider and I could not speak for Dr. Genevive Bi. I did encourage him to consider alternative pain management strategies, as the likelihood of him receiving hydromorphone during tomorrow's visit was felt to low. Briefly discussed pain management  referral, however patient was not interested.   Discussed follow up with surgery (Dr. Genevive Bi) tomorrow at [place] as already scheduled. I have reviewed the follow up and strict return precautions for any new or worsening symptoms. Patient is aware of symptoms that would be deemed urgent/emergent, and would thus require further evaluation either here or in the emergency department. At the time of discharge, he verbalized understanding and consent with the discharge plan as it was reviewed with him. All questions were fielded by provider and/or clinic staff prior to patient discharge.    Final Clinical Impressions / Urgent Care Diagnoses:   Final diagnoses:  Post-operative pain  Drainage from wound  Drug-seeking behavior    New Prescriptions:  [place] Controlled Substance Registry consulted? Yes, I have consulted the [place] Controlled Substances Registry for this patient, and feel that there are red flags present that prevent the safe prescribing further controlled substances at this time. Obvious aberrant drug behaviors displayed in clinic today. For these reasons, I advised patient that I am unable to provide a prescription for opioid pain medications today.   No orders of the defined types were placed in this encounter.  Recommended Follow up Care:  Patient encouraged to follow up with the following provider within the specified time frame, or sooner as dictated by the severity of his symptoms. As always, he was instructed that for any urgent/emergent care needs, he should seek care either here or in the emergency department for more immediate evaluation.  Follow-up Information    Nestor Lewandowsky, MD.   Specialties: Cardiothoracic Surgery, General Surgery Why: You have an appointment scheduled at 8:30 TOMORROW (09/04/2019) for a post-operative follow up. Contact information: [address] [place] [address] [phone]         NOTE: This note was prepared using Dragon dictation  software along with smaller phrase technology. Despite my best ability to proofread, there is the potential that transcriptional errors may still occur from this process, and are completely unintentional.    Karen Kitchens, NP 09/04/19 0151

## 2019-09-03 NOTE — ED Triage Notes (Signed)
Pt had his Xyphoid process removed on 08/20/19. He states that the incision has been draining blood and clear fluid started about 3-4 days ago. He states that the area is infected because it is red around the area and is painful. He does not have the incision covered with a dressing. He has a shirt on that is covered in the drainage. He states that he called his surgeons off and he will never go back there as he had a "fallen out" with them. He states that "Hydromorphone is the only thing that works for me".

## 2019-09-03 NOTE — Telephone Encounter (Signed)
Patient called to let us know that he ended up going to the emergency room yesterday evening but did not stay due to being a 5 hour wait, he stated his "leaking" had stop while he was in the car waiting so he left and went home.   He also stated that his dentist prescribed him amoxicillin and he wanted to know if that will work like pencillin and I advise yes that will. He is taking it due to being red around the incision site.    Patient canceled his appointment for Friday October 30th and will call back if he has any other problems.

## 2019-09-03 NOTE — Discharge Instructions (Addendum)
Left after assessment and denial of Rx for opioid pain medications. Did not wait on AVS. Aware of appointment with Dr. Genevive Bi tomorrow morning at 0830.   Honor Loh, MSN, APRN, FNP-C, CEN Advanced Practice Provider [facility]

## 2019-09-04 ENCOUNTER — Encounter: Payer: Self-pay | Admitting: Cardiothoracic Surgery

## 2019-09-04 ENCOUNTER — Ambulatory Visit (INDEPENDENT_AMBULATORY_CARE_PROVIDER_SITE_OTHER): Payer: Self-pay | Admitting: Cardiothoracic Surgery

## 2019-09-04 ENCOUNTER — Other Ambulatory Visit: Payer: Self-pay

## 2019-09-04 DIAGNOSIS — R0789 Other chest pain: Secondary | ICD-10-CM

## 2019-09-04 NOTE — Progress Notes (Signed)
  He returns today in follow-up.  He did well until a few days ago when he began experiencing some drainage from his wound.  He states that he went to bed one night with a colored shirt on and woke up and it was saturated.  He then put on a white T-shirt and wore that for 2 days and brings that with him today.  The drainage stopped 2 days ago but he says that he wore the shirt just to show how much it drained.  The front of the shirt does have an area which occupies about a third of the surface area showing what appears to be some serosanguineous drainage.  He states today he has no pain.  His only real issue is that he had a toothache for which she sought an attention with the doctor and is placed on amoxicillin and is going to have some dental work performed.  He denied any fever.  He denied any further drainage from the wound.  On physical exam his abdomen is soft and nontender.  There is no palpable mass where the previous xiphoid was.  There is no evidence of seroma or hematoma.  The skin is nonerythematous.  There is no sign of infection.  Overall I am quite pleased with the way it appears.  We did place Steri-Strips on the wound.  He states that he is doing well and he did not request any further refills on his pain medications.  We have made an appointment for him to follow-up with [place] on an as-needed basis.

## 2019-09-04 NOTE — Patient Instructions (Signed)
Please do not apply anything to the incision site. We applied the steri strips. These will begin to fall off within 7-10 days.  You may shower as usual.

## 2019-09-09 ENCOUNTER — Ambulatory Visit: Payer: Medicare Other | Admitting: Physician Assistant

## 2019-09-11 ENCOUNTER — Ambulatory Visit: Payer: Medicare Other | Admitting: Physician Assistant

## 2019-09-14 ENCOUNTER — Ambulatory Visit (INDEPENDENT_AMBULATORY_CARE_PROVIDER_SITE_OTHER): Payer: Medicare Other | Admitting: Physician Assistant

## 2019-09-14 ENCOUNTER — Other Ambulatory Visit: Payer: Self-pay

## 2019-09-14 ENCOUNTER — Encounter: Payer: Self-pay | Admitting: Physician Assistant

## 2019-09-14 VITALS — BP 124/82 | HR 80 | Temp 97.5°F | Wt 176.8 lb

## 2019-09-14 DIAGNOSIS — E1169 Type 2 diabetes mellitus with other specified complication: Secondary | ICD-10-CM

## 2019-09-14 DIAGNOSIS — J449 Chronic obstructive pulmonary disease, unspecified: Secondary | ICD-10-CM

## 2019-09-14 DIAGNOSIS — F1010 Alcohol abuse, uncomplicated: Secondary | ICD-10-CM | POA: Diagnosis not present

## 2019-09-14 DIAGNOSIS — Z765 Malingerer [conscious simulation]: Secondary | ICD-10-CM

## 2019-09-14 DIAGNOSIS — E785 Hyperlipidemia, unspecified: Secondary | ICD-10-CM

## 2019-09-14 DIAGNOSIS — E1165 Type 2 diabetes mellitus with hyperglycemia: Secondary | ICD-10-CM | POA: Diagnosis not present

## 2019-09-14 LAB — POCT GLYCOSYLATED HEMOGLOBIN (HGB A1C)
Est. average glucose Bld gHb Est-mCnc: 214
Hemoglobin A1C: 9.1 % — AB (ref 4.0–5.6)

## 2019-09-14 MED ORDER — FARXIGA 5 MG PO TABS: 5.0000 mg | ORAL_TABLET | Freq: Every day | ORAL | 0 refills | Status: DC
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
## 2019-09-16 ENCOUNTER — Ambulatory Visit: Payer: Self-pay

## 2019-09-16 NOTE — Chronic Care Management (AMB) (Signed)
Chronic Care Management     09/16/2019 Name: Shaun Odonnell MRN: [id number] DOB: April 14, 1966  Primary Care Provider: Trinna Post, PA-C Reason for referral : Chronic Case Management   Shaun Odonnell is a 53 y.o. year old male who is a primary care patient of Trinna Post, [place].    Brief outreach with Shaun Odonnell to discuss care management needs.  He was previously engaged with the Chronic Case Management team. Per EPIC, the last successful outreach was conducted in July 2020.   Shaun Odonnell declines current need for chronic case management services. Per chart review, his medical history includes Hyperlipidemia, Diabetes, Pancreatitis, Chronic obstructive pulmonary disease, Depression and Alcohol abuse. He reports his primary focus is maintaining a diabetic diet. He reports attempting to adhere to the recommended diet and increasing his intake of vegetables. Reports monitoring his blood sugar levels and taking medications for glucose control as prescribed. Morning reading on today was 143 mg/dl. Reports being knowledgeable of s/sx along with treatment interventions for hypoglycemia and hyperglycemia. Denies concerns regarding medication management or prescription cost.  He reports having an accident and falling about a week ago. Denies injuries related to the fall and reports being evaluated by EMS. Reports completing outreach with his primary care provider since the fall. States he ambulates well. Reports performing ADLs and completing tasks independently. Reports attending appointments as scheduled. Denies concerns regarding transportation. Declines need for in-home assistance or community outreach referrals.  Outpatient Encounter Medications as of 09/16/2019  Medication Sig Note  . albuterol (VENTOLIN HFA) 108 (90 Base) MCG/ACT inhaler Inhale 2 puffs into the lungs every 6 (six) hours as needed.   . ALPRAZolam (XANAX) 0.5 MG tablet Take 0.5 mg by mouth 4 (four) times daily. Patient  takes when wakes up(~0600)/ 1000/ 1600/ 2000 01/01/2019: Pt says he takes ~0600 (when wakes up)/ 1000/ 1600/ 2000  . amoxicillin (AMOXIL) 500 MG capsule Take 500 mg by mouth 3 (three) times daily.   . carboxymethylcellulose (REFRESH PLUS) 0.5 % SOLN Place 2 drops into both eyes as needed (Dry eyes).   . dapagliflozin propanediol (FARXIGA) 5 MG TABS tablet Take 5 mg by mouth daily before breakfast.   . glucose blood test strip Use as instructed   . Insulin Glargine (LANTUS) 100 UNIT/ML Solostar Pen Inject 10 Units into the skin at bedtime.  08/20/2019: 5 units last night  . Insulin Pen Needle (UNIFINE PENTIPS) 32G X 4 MM MISC USE WITH INSULIN ONCE DAILY   . metFORMIN (GLUCOPHAGE-XR) 500 MG 24 hr tablet Take 2 tablets (1,000 mg total) by mouth 2 (two) times daily.   Marland Kitchen OLANZapine (ZYPREXA) 15 MG tablet Take 15 mg by mouth at bedtime. At 2000   . ondansetron (ZOFRAN) 8 MG tablet Take 1 tablet (8 mg total) by mouth every 8 (eight) hours as needed for nausea or vomiting.   Glory Rosebush Delica Lancets 99991111 MISC Use to check fasting sugar once daily.   . sertraline (ZOLOFT) 100 MG tablet Take 100 mg by mouth daily.  01/01/2019: Pt takes at 0600  . simvastatin (ZOCOR) 40 MG tablet TAKE 1 TABLET BY MOUTH ONCE DAILY (Patient taking differently: 40 mg daily at 6 PM. )    No facility-administered encounter medications on file as of 09/16/2019.        PLAN Will follow up again in 90 days to discuss care management needs. Will plan to reengage if assistance is needed and patient agrees to services.  [facility]  Care Management [phone]

## 2019-09-17 ENCOUNTER — Telehealth: Payer: Self-pay | Admitting: Physician Assistant

## 2019-09-17 DIAGNOSIS — Z765 Malingerer [conscious simulation]: Secondary | ICD-10-CM | POA: Insufficient documentation

## 2019-09-17 DIAGNOSIS — E1169 Type 2 diabetes mellitus with other specified complication: Secondary | ICD-10-CM | POA: Insufficient documentation

## 2019-09-17 DIAGNOSIS — E1165 Type 2 diabetes mellitus with hyperglycemia: Secondary | ICD-10-CM

## 2019-09-17 DIAGNOSIS — E785 Hyperlipidemia, unspecified: Secondary | ICD-10-CM | POA: Insufficient documentation

## 2019-09-17 MED ORDER — CANAGLIFLOZIN 100 MG PO TABS: 100.0000 mg | ORAL_TABLET | Freq: Every day | ORAL | 0 refills | Status: DC

## 2019-09-17 NOTE — Telephone Encounter (Signed)
Pt needing a call back at [phone].  Pt really stressing he doesn't want to try no other medication since the [place] is not making him feel bad.  He is getting help now.  Thanks, [employer]

## 2019-09-17 NOTE — Telephone Encounter (Signed)
Patient was advised via vm (per DPR).

## 2019-09-17 NOTE — Telephone Encounter (Signed)
Pt calling to let Adriana know the dapagliflozin propanediol (FARXIGA) 5 MG TABS tablet Is working great for him.  However hiis insurance will not cover this medication.  His insurance is wanting him to try Jardiance.  He has tried Jardiance in the past.  Does not agree with him.  He has side effects with it.  Is there something that can be done to help him to get the dapagliflozin propanediol (FARXIGA) 5 MG TABS tablet covered by his insurance.  Please call pt back to advise asap.  He is almost out of the samples.  His CB # [id number].  Thanks, [employer]

## 2019-09-17 NOTE — Telephone Encounter (Signed)
Spoke with [employer] and they states that he have to try [place] before the [place] wil be covered by the insurance. FYI

## 2019-09-17 NOTE — Patient Instructions (Signed)
Diabetes Mellitus and Exercise Exercising regularly is important for your overall health, especially when you have diabetes (diabetes mellitus). Exercising is not only about losing weight. It has many other health benefits, such as increasing muscle strength and bone density and reducing body fat and stress. This leads to improved fitness, flexibility, and endurance, all of which result in better overall health. Exercise has additional benefits for people with diabetes, including:  Reducing appetite.  Helping to lower and control blood glucose.  Lowering blood pressure.  Helping to control amounts of fatty substances (lipids) in the blood, such as cholesterol and triglycerides.  Helping the body to respond better to insulin (improving insulin sensitivity).  Reducing how much insulin the body needs.  Decreasing the risk for heart disease by: ? Lowering cholesterol and triglyceride levels. ? Increasing the levels of good cholesterol. ? Lowering blood glucose levels. What is my activity plan? Your health care provider or certified diabetes educator can help you make a plan for the type and frequency of exercise (activity plan) that works for you. Make sure that you:  Do at least 150 minutes of moderate-intensity or vigorous-intensity exercise each week. This could be brisk walking, biking, or water aerobics. ? Do stretching and strength exercises, such as yoga or weightlifting, at least 2 times a week. ? Spread out your activity over at least 3 days of the week.  Get some form of physical activity every day. ? Do not go more than 2 days in a row without some kind of physical activity. ? Avoid being inactive for more than 30 minutes at a time. Take frequent breaks to walk or stretch.  Choose a type of exercise or activity that you enjoy, and set realistic goals.  Start slowly, and gradually increase the intensity of your exercise over time. What do I need to know about managing my  diabetes?   Check your blood glucose before and after exercising. ? If your blood glucose is 240 mg/dL (13.3 mmol/L) or higher before you exercise, check your urine for ketones. If you have ketones in your urine, do not exercise until your blood glucose returns to normal. ? If your blood glucose is 100 mg/dL (5.6 mmol/L) or lower, eat a snack containing 15-20 grams of carbohydrate. Check your blood glucose 15 minutes after the snack to make sure that your level is above 100 mg/dL (5.6 mmol/L) before you start your exercise.  Know the symptoms of low blood glucose (hypoglycemia) and how to treat it. Your risk for hypoglycemia increases during and after exercise. Common symptoms of hypoglycemia can include: ? Hunger. ? Anxiety. ? Sweating and feeling clammy. ? Confusion. ? Dizziness or feeling light-headed. ? Increased heart rate or palpitations. ? Blurry vision. ? Tingling or numbness around the mouth, lips, or tongue. ? Tremors or shakes. ? Irritability.  Keep a rapid-acting carbohydrate snack available before, during, and after exercise to help prevent or treat hypoglycemia.  Avoid injecting insulin into areas of the body that are going to be exercised. For example, avoid injecting insulin into: ? The arms, when playing tennis. ? The legs, when jogging.  Keep records of your exercise habits. Doing this can help you and your health care provider adjust your diabetes management plan as needed. Write down: ? Food that you eat before and after you exercise. ? Blood glucose levels before and after you exercise. ? The type and amount of exercise you have done. ? When your insulin is expected to peak, if you use   insulin. Avoid exercising at times when your insulin is peaking.  When you start a new exercise or activity, work with your health care provider to make sure the activity is safe for you, and to adjust your insulin, medicines, or food intake as needed.  Drink plenty of water while  you exercise to prevent dehydration or heat stroke. Drink enough fluid to keep your urine clear or pale yellow. Summary  Exercising regularly is important for your overall health, especially when you have diabetes (diabetes mellitus).  Exercising has many health benefits, such as increasing muscle strength and bone density and reducing body fat and stress.  Your health care provider or certified diabetes educator can help you make a plan for the type and frequency of exercise (activity plan) that works for you.  When you start a new exercise or activity, work with your health care provider to make sure the activity is safe for you, and to adjust your insulin, medicines, or food intake as needed. This information is not intended to replace advice given to you by your health care provider. Make sure you discuss any questions you have with your health care provider. Document Released: 01/12/2004 Document Revised: 05/16/2017 Document Reviewed: 04/02/2016 Elsevier Patient Education  2020 Elsevier Inc.  

## 2019-09-17 NOTE — Telephone Encounter (Signed)
Then he will need to try invokana. 30 days of 100 mg invokana sent in, he should take it with first meal of the day.

## 2019-09-28 ENCOUNTER — Telehealth: Payer: Self-pay | Admitting: Physician Assistant

## 2019-09-28 NOTE — Telephone Encounter (Signed)
Pt called back, requesting a call back [phone] again declined OV/VV

## 2019-09-28 NOTE — Telephone Encounter (Signed)
From [facility] 

## 2019-09-28 NOTE — Telephone Encounter (Signed)
Patient calling to inquire if PCP could send RX to the pharmacy for ear ache. Patient declined OV at this time. Please advise.

## 2019-09-29 ENCOUNTER — Ambulatory Visit (INDEPENDENT_AMBULATORY_CARE_PROVIDER_SITE_OTHER): Payer: Medicare Other | Admitting: Physician Assistant

## 2019-09-29 VITALS — Temp 97.0°F | Ht 68.0 in | Wt 180.0 lb

## 2019-09-29 DIAGNOSIS — H9201 Otalgia, right ear: Secondary | ICD-10-CM

## 2019-09-29 MED ORDER — OFLOXACIN 0.3 % OT SOLN: 5.0000 [drp] | Freq: Two times a day (BID) | OTIC | 0 refills | Status: DC

## 2019-09-29 NOTE — Progress Notes (Signed)
Patient: Shaun Odonnell Male    DOB: 11/21/1965   53 y.o.   MRN: [id number] Visit Date: 09/29/2019  Today's Provider: Trinna Post, PA-C   Chief Complaint  Patient presents with  . Ear Pain   Subjective:    Virtual Visit via Telephone Note  I connected with [name] on 09/29/19 at  4:00 PM EST by telephone and verified that I am speaking with the correct person using two identifiers.  I discussed the assessment and treatment plan with the patient. The patient was provided an opportunity to ask questions and all were answered. The patient agreed with the plan and demonstrated an understanding of the instructions.   The patient was advised to call back or seek an in-person evaluation if the symptoms worsen or if the condition fails to improve as anticipated.   Location: Patient: Home Provider: Office  Otalgia  There is pain in the right ear. This is a new problem. The current episode started yesterday. The problem occurs constantly. There has been no fever. The pain is mild.   Patient reports he woke up yesterday morning and had an earache in his right side. Alternates between sharp shooting pain and throbbing pain. He reports history of earaches as a child with ruptured ear pain. The patient had A rear Right Molar Extraction, Bone Graft and sutures placed on 09/24/2019. He reports he is on amoxicillin 500 mg TID for three weeks. Reports Dr. Olena Heckle did this with [facility] oromaxillofacial surgery. Reports he is having some pain surgery.   Patient reports he went to dental surgeon's office yesterday because he thought he had a gap where the tooth was and suture came loose. Patient report his dental surgeon checked him out and it looks nice.   Allergies  Allergen Reactions  . Fentanyl Nausea And Vomiting  . Morphine And Related Shortness Of Breath  . Klonopin [Clonazepam] Other (See Comments)    Dystonic Reaction    . Pimozide Other (See Comments)   Dystonic Reaction   . Stelazine [Trifluoperazine] Other (See Comments)    Dystonic Reaction     Current Outpatient Medications:  .  ALPRAZolam (XANAX) 0.5 MG tablet, Take 0.5 mg by mouth 4 (four) times daily. Patient takes when wakes up(~0600)/ 1000/ 1600/ 2000, Disp: , Rfl:  .  amoxicillin (AMOXIL) 500 MG capsule, Take 500 mg by mouth 3 (three) times daily. From Dental Surgery, Disp: , Rfl:  .  canagliflozin (INVOKANA) 100 MG TABS tablet, Take 1 tablet (100 mg total) by mouth daily before breakfast., Disp: 30 tablet, Rfl: 0 .  carboxymethylcellulose (REFRESH PLUS) 0.5 % SOLN, Place 2 drops into both eyes as needed (Dry eyes)., Disp: , Rfl:  .  fluticasone (FLONASE) 50 MCG/ACT nasal spray, , Disp: , Rfl:  .  Insulin Glargine (LANTUS) 100 UNIT/ML Solostar Pen, Inject 10 Units into the skin at bedtime. , Disp: , Rfl:  .  Insulin Pen Needle (UNIFINE PENTIPS) 32G X 4 MM MISC, USE WITH INSULIN ONCE DAILY, Disp: 90 each, Rfl: 1 .  metFORMIN (GLUCOPHAGE-XR) 500 MG 24 hr tablet, Take 2 tablets (1,000 mg total) by mouth 2 (two) times daily., Disp: 360 tablet, Rfl: 1 .  OLANZapine (ZYPREXA) 15 MG tablet, Take 15 mg by mouth at bedtime. At 2000, Disp: , Rfl:  .  ondansetron (ZOFRAN) 8 MG tablet, Take 1 tablet (8 mg total) by mouth every 8 (eight) hours as needed for nausea or vomiting., Disp: 20 tablet,  Rfl: 0 .  sertraline (ZOLOFT) 100 MG tablet, Take 100 mg by mouth daily. , Disp: , Rfl:  .  simvastatin (ZOCOR) 40 MG tablet, TAKE 1 TABLET BY MOUTH ONCE DAILY (Patient taking differently: 40 mg daily at 6 PM. ), Disp: 90 tablet, Rfl: 0 .  albuterol (VENTOLIN HFA) 108 (90 Base) MCG/ACT inhaler, Inhale 2 puffs into the lungs every 6 (six) hours as needed., Disp: 8 g, Rfl: 3 .  glucose blood test strip, Use as instructed, Disp: 100 each, Rfl: 12 .  ofloxacin (FLOXIN OTIC) 0.3 % OTIC solution, Place 5 drops into the right ear 2 (two) times daily., Disp: 5 mL, Rfl: 0 .  OneTouch Delica Lancets 99991111 MISC, Use  to check fasting sugar once daily., Disp: 100 each, Rfl: 1  Review of Systems  HENT: Positive for ear pain.     Social History   Tobacco Use  . Smoking status: Current Every Day Smoker    Packs/day: 1.50    Years: 30.00    Pack years: 45.00    Types: Cigarettes  . Smokeless tobacco: Former Systems developer    Types: Snuff  Substance Use Topics  . Alcohol use: Yes    Alcohol/week: 6.0 standard drinks    Types: 6 Cans of beer per week    Comment: every 2 days      Objective:   Temp (!) 97 F (36.1 C) (Temporal)   Ht 5\' 8"  (1.727 m)   Wt 180 lb (81.6 kg)   BMI 27.37 kg/m  Vitals:   09/29/19 1602  Temp: (!) 97 F (36.1 C)  TempSrc: Temporal  Weight: 180 lb (81.6 kg)  Height: 5\' 8"  (1.727 m)  Body mass index is 27.37 kg/m.   Physical Exam   No results found for any visits on 09/29/19.     Assessment & Plan    1. Right ear pain  Will give ear drops as below to cover for infection but I suspect this may be referred pain. Advised him to contact surgeon and check with him.   - ofloxacin (FLOXIN OTIC) 0.3 % OTIC solution; Place 5 drops into the right ear 2 (two) times daily.  Dispense: 5 mL; Refill: 0   The entirety of the information documented in the History of Present Illness, Review of Systems and Physical Exam were personally obtained by me. Portions of this information were initially documented by [facility], CMA and reviewed by me for thoroughness and accuracy.   F/u PRN       Trinna Post, PA-C  [facility]

## 2019-09-29 NOTE — Telephone Encounter (Signed)
Called patient at 4:04 for telephone office visit. No Answer. Phone just rings continuously with no voice messaging system.

## 2019-09-30 ENCOUNTER — Other Ambulatory Visit: Payer: Self-pay

## 2019-09-30 ENCOUNTER — Telehealth: Payer: Self-pay | Admitting: *Deleted

## 2019-09-30 ENCOUNTER — Ambulatory Visit
Admission: EM | Admit: 2019-09-30 | Discharge: 2019-09-30 | Disposition: A | Discharge status: Home / Self Care | Payer: Medicare Other | Attending: Emergency Medicine | Admitting: Emergency Medicine

## 2019-09-30 ENCOUNTER — Encounter: Payer: Self-pay | Admitting: Emergency Medicine

## 2019-09-30 DIAGNOSIS — H6121 Impacted cerumen, right ear: Secondary | ICD-10-CM

## 2019-09-30 DIAGNOSIS — J029 Acute pharyngitis, unspecified: Secondary | ICD-10-CM

## 2019-09-30 DIAGNOSIS — H9201 Otalgia, right ear: Secondary | ICD-10-CM | POA: Diagnosis not present

## 2019-09-30 MED ORDER — OFLOXACIN 0.3 % OT SOLN: 5.0000 [drp] | Freq: Two times a day (BID) | OTIC | 0 refills | Status: DC

## 2019-09-30 MED ORDER — FLUTICASONE PROPIONATE 50 MCG/ACT NA SUSP: 2.0000 | Freq: Every day | NASAL | 0 refills | Status: DC

## 2019-09-30 NOTE — Telephone Encounter (Signed)
Patient was seen in office yesterday for right ear pain. Patient was given rx for ofloxacin (FLOXIN OTIC) 0.3 % OTIC solution. Patient called call center wanting to know if ear drops prescribed are suppose to hurt his ear when he uses them? Please advise?

## 2019-09-30 NOTE — Telephone Encounter (Signed)
Please advise 

## 2019-09-30 NOTE — Telephone Encounter (Signed)
Pt calling back.  States that he has been on antibiotics for three weeks for a dental surgery done.   Pt states that he wants to know if he can have antibiotics for an inner ear infection. Pt has been taking 600mg  ibuprofen and 1000mg  of tylenol every 6 hours since the dental surgery as well.   Can this affect his ear. Pt states that the ear drops work but the bottle is so small that he is afraid he is going to run out. He is looking for something for pain in the ear. Pt also wants to know why Porsha told him to get a COVID test. Pt can be reached at [phone]

## 2019-09-30 NOTE — Telephone Encounter (Addendum)
Pt is calling back and he woke up today with a sore throat and would like something for sore throat and ear pain is still there and pt would like to know if they make  medicated ear drop for ear. Pt throat is hurting on right side as with  ear pain. [facility] phone [phone]

## 2019-09-30 NOTE — ED Triage Notes (Signed)
Patient declines strep test.

## 2019-09-30 NOTE — Telephone Encounter (Signed)
Called patient and LVM for him to call back to give more info on the drops hurting his ears. Created a CRM for if he calls back to get more info.

## 2019-09-30 NOTE — ED Provider Notes (Signed)
HPI  SUBJECTIVE:  Shaun Odonnell is a 53 y.o. male who presents with sharp shooting achy intermittent gnawing right ear pain starting yesterday.  It lasts 30 minutes to hours.  He reports a headache, but states that getting better.  He is status post dental surgery 6 days ago where he had a right lower molar removed with a bone graft.  He has been on amoxicillin for 3 weeks.  His last dose was today.  States that he went to the oral surgeon 2 days ago, was told that he was doing well. Had an E visit for right ear pain yesterday, but it was thought to be most likely referred pain from his dental surgery, however was started on ofloxacin eardrops.  He called his oral surgeon back, was told that his ear pain was probably not his dental surgery because the no evidence of dry socket or infection when he was evaluated.  He called his PMD back today and was told to come in for evaluation.  He denies grinding his teeth, change in his hearing, otorrhea, foreign body insertion, facial rash.  States that he has had symptoms like this before when he had otitis media.  He took Tylenol/ibuprofen within 4 to 6 hours of evaluation.  He has been taking 600 mg ibuprofen combined with 1 g of Tylenol every 6 hours for the dental pain with improvement in his symptoms.  He has been doing the ofloxacin eardrops 5 drops twice daily, he is on day #2 of this, states that he is running out tomorrow.  States that he has difficulty doing 5 drops exactly, may be using more than that. the ofloxacin eardrops seem to be helping. there are no aggravating factors.  The ear pain is not associated with chewing or yawning.He also reports a right-sided sore throat today.  He denies difficulty breathing, sensation of throat swelling shut, drooling, trismus, swollen glands, voice changes.  No GERD symptoms.  He reports 1 month of rhinorrhea, nasal congestion.  Denies postnasal drip.  He has a past medical history of COPD, diabetes, otitis media,  chickenpox, hypertension which has resolved.  [id number], Wendee Beavers, PA-C Oral surgery: [facility]  Past Medical History:  Diagnosis Date  . Alcohol abuse 04/29/2019  . Allergy   . Anxiety   . ARDS (adult respiratory distress syndrome) ([place])   . ARDS (adult respiratory distress syndrome) ([place])   . Bipolar disorder ([place])   . Borderline diabetes   . Cataract   . Chronic kidney disease   . COPD (chronic obstructive pulmonary disease) (H[facility])    chronic cough and wheezing  . Depression   . Diabetes mellitus without complication ([place])   . Dizziness    medication related  . Dyspnea    with exertion  . Elevated lipids   . Fatty liver   . Hypercholesterolemia   . Hypertension   . Pancreatitis   . Pneumonia    in past  . Pneumonia    in past  . Pre-diabetes    diet controlled  . Pulmonary embolism ([place])   . Schizoaffective disorder [facility])     Past Surgical History:  Procedure Laterality Date  . CATARACT EXTRACTION W/PHACO Right 03/26/2018   Procedure: CATARACT EXTRACTION PHACO AND INTRAOCULAR LENS PLACEMENT ([place]) RIGHT BORDERLINE DIABETIC;  Surgeon: Leandrew Koyanagi, MD;  Location: [place];  Service: Ophthalmology;  Laterality: Right;  . CATARACT EXTRACTION W/PHACO Left 04/23/2018   Procedure: CATARACT EXTRACTION PHACO AND INTRAOCULAR LENS PLACEMENT (  I[facility])  LEFT BORDERLINE DIABETIC;  Surgeon: Leandrew Koyanagi, MD;  Location: [place];  Service: Ophthalmology;  Laterality: Left;  . COLONOSCOPY WITH PROPOFOL N/A 08/21/2017   Procedure: COLONOSCOPY WITH PROPOFOL;  Surgeon: Manya Silvas, MD;  Location[facility] ENDOSCOPY;  Service: Endoscopy;  Laterality: N/A;  . EYE SURGERY    . HERNIA REPAIR     inguinal and umbilical  . RIB RESECTION N/A 08/20/2019   Procedure: removal of xyphoid process;  Surgeon: Nestor Lewandowsky, MD;  Location: [facility] ORS;  Service: Thoracic;  Laterality: N/A;  . UMBILICAL HERNIA REPAIR N/A 06/22/2019   Procedure: OPEN  HERNIA REPAIR UMBILICAL ADULT;  Surgeon: Olean Ree, MD;  Location: [facility] ORS;  Service: General;  Laterality: N/A;  . UPPER GI ENDOSCOPY      Family History  Problem Relation Age of Onset  . Hyperlipidemia Mother   . Hypertension Father   . Dementia Maternal Grandmother   . Heart disease Maternal Grandfather   . Heart disease Paternal Grandmother   . Stroke Paternal Grandfather     Social History   Tobacco Use  . Smoking status: Current Every Day Smoker    Packs/day: 1.50    Years: 30.00    Pack years: 45.00    Types: Cigarettes  . Smokeless tobacco: Former Systems developer    Types: Snuff  Substance Use Topics  . Alcohol use: Yes    Alcohol/week: 6.0 standard drinks    Types: 6 Cans of beer per week    Comment: every 2 days  . Drug use: Not Currently    No current facility-administered medications for this encounter.   Current Outpatient Medications:  .  acetaminophen (TYLENOL) 500 MG tablet, Take 1,000 mg by mouth every 6 (six) hours as needed., Disp: , Rfl:  .  albuterol (VENTOLIN HFA) 108 (90 Base) MCG/ACT inhaler, Inhale 2 puffs into the lungs every 6 (six) hours as needed., Disp: 8 g, Rfl: 3 .  ALPRAZolam (XANAX) 0.5 MG tablet, Take 0.5 mg by mouth 4 (four) times daily. Patient takes when wakes up(~0600)/ 1000/ 1600/ 2000, Disp: , Rfl:  .  canagliflozin (INVOKANA) 100 MG TABS tablet, Take 1 tablet (100 mg total) by mouth daily before breakfast., Disp: 30 tablet, Rfl: 0 .  carboxymethylcellulose (REFRESH PLUS) 0.5 % SOLN, Place 2 drops into both eyes as needed (Dry eyes)., Disp: , Rfl:  .  glucose blood test strip, Use as instructed, Disp: 100 each, Rfl: 12 .  ibuprofen (ADVIL) 600 MG tablet, Take 600 mg by mouth every 6 (six) hours as needed., Disp: , Rfl:  .  Insulin Glargine (LANTUS) 100 UNIT/ML Solostar Pen, Inject 10 Units into the skin at bedtime. , Disp: , Rfl:  .  Insulin Pen Needle (UNIFINE PENTIPS) 32G X 4 MM MISC, USE WITH INSULIN ONCE DAILY, Disp: 90 each, Rfl: 1 .   metFORMIN (GLUCOPHAGE-XR) 500 MG 24 hr tablet, Take 2 tablets (1,000 mg total) by mouth 2 (two) times daily., Disp: 360 tablet, Rfl: 1 .  OLANZapine (ZYPREXA) 15 MG tablet, Take 15 mg by mouth at bedtime. At 2000, Disp: , Rfl:  .  ondansetron (ZOFRAN) 8 MG tablet, Take 1 tablet (8 mg total) by mouth every 8 (eight) hours as needed for nausea or vomiting., Disp: 20 tablet, Rfl: 0 .  OneTouch Delica Lancets 99991111 MISC, Use to check fasting sugar once daily., Disp: 100 each, Rfl: 1 .  sertraline (ZOLOFT) 100 MG tablet, Take 100 mg by mouth daily. , Disp: , Rfl:  .  simvastatin (ZOCOR) 40 MG tablet, TAKE 1 TABLET BY MOUTH ONCE DAILY (Patient taking differently: 40 mg daily at 6 PM. ), Disp: 90 tablet, Rfl: 0 .  amoxicillin (AMOXIL) 500 MG capsule, Take 500 mg by mouth 3 (three) times daily. From Dental Surgery, Disp: , Rfl:  .  fluticasone (FLONASE) 50 MCG/ACT nasal spray, Place 2 sprays into both nostrils daily., Disp: 16 g, Rfl: 0 .  ofloxacin (FLOXIN OTIC) 0.3 % OTIC solution, Place 5 drops into the right ear 2 (two) times daily. For 7 days total, Disp: 10 mL, Rfl: 0  Allergies  Allergen Reactions  . Fentanyl Nausea And Vomiting  . Morphine And Related Shortness Of Breath  . Klonopin [Clonazepam] Other (See Comments)    Dystonic Reaction    . Pimozide Other (See Comments)    Dystonic Reaction   . Stelazine [Trifluoperazine] Other (See Comments)    Dystonic Reaction  . Tramadol Nausea Only     ROS  As noted in HPI.   Physical Exam  BP 124/72 (BP Location: Left Arm)   Pulse 64   Temp 98.2 F (36.8 C) (Oral)   Resp 18   Ht 5\' 8"  (1.727 m)   Wt 81.6 kg   SpO2 98%   BMI 27.37 kg/m   Constitutional: Well developed, well nourished, no acute distress Eyes:  EOMI, conjunctiva normal bilaterally HENT: Normocephalic, atraumatic,mucus membranes moist.  Right external ear normal.  Mild pain with traction on pinna.  No pain with palpation of tragus, mastoid.  No swelling behind the ear.   Cerumen obscuring TM.  I curetted this out.  EAC appears normal.no erythema, swelling, rash.  TM intact, no dullness, bulging, erythema.  No crepitus, tenderness over the right TMJ.  Tonsils without exudates.  Positive postnasal drip.  No upper dental tenderness.  Surgical site lower right gum stitch intact, no erythema, swelling, purulent drainage. Neck: No cervical lymphadenopathy Respiratory: Normal inspiratory effort Cardiovascular: Normal rate no murmurs GI: nondistended skin: No rash, skin intact Musculoskeletal: no deformities Neurologic: Alert & oriented x 3, no focal neuro deficits Psychiatric: Speech and behavior appropriate   ED Course   Medications - No data to display  No orders of the defined types were placed in this encounter.   No results found for this or any previous visit (from the past 24 hour(s)). No results found.  ED Clinical Impression  1. Right ear pain   2. Impacted cerumen of right ear   3. Sore throat      ED Assessment/Plan  Outside records reviewed.  As noted in HPI.  Unsure as to the etiology of his otalgia.  While his EAC seems fine, it seems to be responding ofloxacin eardrops, will refill this.  Will need to be on this for 7 days total.  I discussed with patient this could be early shingles.  Does not appear to be a otitis, mastoiditis, dental infection, TMJ arthralgia, referred pain from the throat.  Suspect the sore throat is from the ear or from postnasal drip.  Will send home with Flonase, Claritin Zyrtec or Allegra.  Continue Tylenol/ibuprofen combination 3 or 4 times a day.  Discussed MDM, treatment plan, and plan for follow-up with patient. Discussed sn/sx that should prompt return to the ED. patient agrees with plan.   Meds ordered this encounter  Medications  . ofloxacin (FLOXIN OTIC) 0.3 % OTIC solution    Sig: Place 5 drops into the right ear 2 (two) times daily. For 7 days total  Dispense:  10 mL    Refill:  0  . fluticasone  (FLONASE) 50 MCG/ACT nasal spray    Sig: Place 2 sprays into both nostrils daily.    Dispense:  16 g    Refill:  0    *This clinic note was created using [employer]. Therefore, there may be occasional mistakes despite careful proofreading.   ?    Melynda Ripple, MD 10/01/19 1227

## 2019-09-30 NOTE — Telephone Encounter (Signed)
I already gave him a medicated ear drop. He is on amoxicillin which would treat bacterial causes. His sore throat pain should be treated with the anti-inflammatory he is using for dental surgery. It will likely go away in several days. If he wishes he can also get tested for COVID at [facility].

## 2019-09-30 NOTE — Telephone Encounter (Signed)
Patient was advised and states that he never stated anything about ear drops only states something about sore throat.FYI

## 2019-09-30 NOTE — Discharge Instructions (Addendum)
I am refilling your ofloxacin eardrops.  You will need to be on this for 7 days total.  Continue Tylenol/ibuprofen 3 or 4 times a day.  Flonase, saline nasal irrigation with a Milta Deiters med rinse and distilled water as often you want, and antihistamine such as Claritin Allegra Zyrtec for the nasal congestion and postnasal drip.

## 2019-09-30 NOTE — Telephone Encounter (Signed)
No they're not.

## 2019-09-30 NOTE — Telephone Encounter (Signed)
He's on poral antibiotics that treat inner ear infections. Keep using ear drops as prescribed.

## 2019-09-30 NOTE — ED Triage Notes (Signed)
Patient in today c/o right ear pain x 1 day. Patient had dental surgery with a bone graft on 09/24/19 and has been on Amoxicillin x 3 weeks, taking last dose today. Patient called PCP and they sent in Rx for Ofloxacin 0.3% yesterday. Patient states drops aren't working. Patient states he woke up with sore throat on the right side this morning.

## 2019-09-30 NOTE — Telephone Encounter (Signed)
Pt stated that he feels he needs the ear drops prescribed to him more than 2x a day. He stated they work for a little bit and then the pain comes right back.  He would like to know if there is a medicated drop for inner ear pain or if the antibiotic is the only option.  He alsos woke up with a sore throat on the right side which is the same side as the ear ache. Pt stated he has been using chloraseptic spray but that it is temporary relief. Stated the drops are not causing pain.  Requesting Cb.

## 2019-10-13 ENCOUNTER — Other Ambulatory Visit: Payer: Self-pay | Admitting: Physician Assistant

## 2019-10-13 DIAGNOSIS — E1165 Type 2 diabetes mellitus with hyperglycemia: Secondary | ICD-10-CM

## 2019-10-13 MED ORDER — CANAGLIFLOZIN 100 MG PO TABS: 100.0000 mg | ORAL_TABLET | Freq: Every day | ORAL | 0 refills | Status: DC

## 2019-10-13 NOTE — Telephone Encounter (Signed)
Pt request refill   canagliflozin (INVOKANA) 100 MG TABS tablet  90 days w/ refills.   Pt states this med is working find, and hthis is what his insuranc will pay for  [facility], [place]. [phone] (Phone) [phone] (Fax)

## 2019-11-11 ENCOUNTER — Telehealth: Payer: Self-pay

## 2019-11-11 NOTE — Telephone Encounter (Signed)
Copied from [place] [id number]. Topic: Referral - Question >> Nov 11, 2019  2:48 PM Percell Belt A wrote: Reason for CRM: pt called in and stated he is would like to know [facility] Direct number.  He stated that he is not sure where he is located and he was not aware of appt.  He is needed more info about the appt on the [employer] [employer] number [phone]

## 2019-11-12 ENCOUNTER — Telehealth: Payer: Self-pay | Admitting: Physician Assistant

## 2019-11-12 DIAGNOSIS — E1165 Type 2 diabetes mellitus with hyperglycemia: Secondary | ICD-10-CM

## 2019-11-12 NOTE — Telephone Encounter (Signed)
Pt called stating that he is needing to have a prescription put out stating he is needing to test 2x a day. Pt states he has only been receiving 50 test strips. Pt also states he is needing the one touch delica lancets.  Please advise.    [facility], [place] [address]  Phone: [phone] Fax: [phone]  Not a 24 hour pharmacy; exact hours not known.

## 2019-11-13 ENCOUNTER — Telehealth: Payer: Self-pay | Admitting: Physician Assistant

## 2019-11-13 MED ORDER — ONETOUCH DELICA LANCETS 33G MISC: 1 refills | Status: DC

## 2019-11-13 MED ORDER — FLUTICASONE PROPIONATE 50 MCG/ACT NA SUSP: 2.0000 | Freq: Every day | NASAL | 0 refills | Status: DC

## 2019-11-13 NOTE — Telephone Encounter (Signed)
Copied from [place] [id number]. Topic: Quick Communication - Rx Refill/Question >> Nov 13, 2019 10:29 AM Yvette Rack wrote: Medication: fluticasone (FLONASE) 50 MCG/ACT nasal spray  Has the patient contacted their pharmacy? yes  Preferred Pharmacy (with phone number or street name): [place], [place]. Phone: [phone]  Fax: [phone]  Agent: Please be advised that RX refills may take up to 3 business days. We ask that you follow-up with your pharmacy.

## 2019-11-13 NOTE — Telephone Encounter (Signed)
Okay to refill with checking once in am and once before bed.

## 2019-11-13 NOTE — Telephone Encounter (Signed)
Medication was refilled as advised.

## 2019-11-16 ENCOUNTER — Ambulatory Visit (INDEPENDENT_AMBULATORY_CARE_PROVIDER_SITE_OTHER): Payer: Medicare Other | Admitting: Physician Assistant

## 2019-11-16 ENCOUNTER — Telehealth: Payer: Self-pay

## 2019-11-16 ENCOUNTER — Other Ambulatory Visit: Payer: Self-pay

## 2019-11-16 ENCOUNTER — Encounter: Payer: Self-pay | Admitting: Physician Assistant

## 2019-11-16 DIAGNOSIS — J449 Chronic obstructive pulmonary disease, unspecified: Secondary | ICD-10-CM

## 2019-11-16 DIAGNOSIS — E1169 Type 2 diabetes mellitus with other specified complication: Secondary | ICD-10-CM | POA: Diagnosis not present

## 2019-11-16 DIAGNOSIS — F319 Bipolar disorder, unspecified: Secondary | ICD-10-CM

## 2019-11-16 DIAGNOSIS — R1084 Generalized abdominal pain: Secondary | ICD-10-CM | POA: Diagnosis not present

## 2019-11-16 DIAGNOSIS — E1165 Type 2 diabetes mellitus with hyperglycemia: Secondary | ICD-10-CM

## 2019-11-16 DIAGNOSIS — E785 Hyperlipidemia, unspecified: Secondary | ICD-10-CM

## 2019-11-16 MED ORDER — SIMVASTATIN 40 MG PO TABS: 40.0000 mg | ORAL_TABLET | Freq: Every day | ORAL | 0 refills | Status: DC

## 2019-11-16 MED ORDER — CANAGLIFLOZIN 100 MG PO TABS: 100.0000 mg | ORAL_TABLET | Freq: Every day | ORAL | 0 refills | Status: DC

## 2019-11-16 NOTE — Telephone Encounter (Signed)
Copied from [place] [id number]. Topic: General - Inquiry >> Nov 16, 2019  8:40 AM Mathis Bud wrote: Reason for CRM: Patient is scheduled for appt at 1 today with PCP.  Patient would like to change to telephone visit.  Patient does not have a phone to do video. Patient call back [phone]

## 2019-11-16 NOTE — Telephone Encounter (Signed)
Appointment was schedule already.

## 2019-11-16 NOTE — Progress Notes (Signed)
Patient: Shaun Odonnell Male    DOB: [birth year]   54 y.o.   MRN: [id number] Visit Date: 11/16/2019  Today's Provider: Trinna Post, PA-C   Chief Complaint  Patient presents with  . Diabetes   Subjective:    I, Porsha McClurkin,CMA am acting as a Education administrator for [employer]. Virtual Visit via Telephone Note  I connected with Shaun Odonnell on 11/16/19 at  1:20 PM EST by telephone and verified that I am speaking with the correct person using two identifiers.  Location: Patient: Home Provider: Office   I discussed the limitations, risks, security and privacy concerns of performing an evaluation and management service by telephone and the availability of in person appointments. I also discussed with the patient that there may be a patient responsible charge related to this service. The patient expressed understanding and agreed to proceed.  HPI  Diabetes Mellitus Type II, Follow-up:   Lab Results  Component Value Date   HGBA1C 9.1 (A) 09/14/2019   HGBA1C 9.1 (A) 06/30/2019   HGBA1C 11.7 (H) 04/29/2019    Last seen for diabetes 2 months ago.  Management since then includes started on Invokana after switching from jardiance due to it causing headaches. His insurance preferred Invokana. He reports he is doing well with Invokana. Reports fasting sugars have been fluctuating. Continues to take 10 units Lantus at night before bed. Continues metformin XR 1000 mg BID.  He reports good compliance with treatment. He is not having side effects.  Current symptoms include none and have been stable. Home blood sugar records: fasting range: 106-214  Episodes of hypoglycemia? no   Current insulin regiment: Lantus 10 units. Most Recent Eye Exam: UTD Weight trend: stable Prior visit with dietician: No Current exercise: none Current diet habits: well balanced  Pertinent Labs:    Component Value Date/Time   CHOL 180 04/29/2019 1036   CHOL 140 08/03/2014 0620   TRIG  430 (H) 04/29/2019 1036   TRIG 241 (H) 08/03/2014 0620   HDL 33 (L) 04/29/2019 1036   HDL 35 (L) 08/03/2014 0620   LDLCALC Comment 04/29/2019 1036   LDLCALC 57 08/03/2014 0620   CREATININE 0.50 (L) 09/02/2019 1741   CREATININE 0.79 11/26/2014 0336    Wt Readings from Last 3 Encounters:  09/30/19 180 lb (81.6 kg)  09/29/19 180 lb (81.6 kg)  09/14/19 176 lb 12.8 oz (80.2 kg)   Hyperlipidemia: Continues with statin.   Lipid Panel     Component Value Date/Time   CHOL 180 04/29/2019 1036   CHOL 140 08/03/2014 0620   TRIG 430 (H) 04/29/2019 1036   TRIG 241 (H) 08/03/2014 0620   HDL 33 (L) 04/29/2019 1036   HDL 35 (L) 08/03/2014 0620   CHOLHDL 5.5 (H) 04/29/2019 1036   VLDL 48 (H) 08/03/2014 0620   LDLCALC Comment 04/29/2019 1036   LDLCALC 57 08/03/2014 0620   LABVLDL Comment 04/29/2019 1036   Patient reports he is having abdominal pain. He has had multiple episodes of pancreatitis. Reports he is having abdominal pain and knows this is pancreatitis. He is requesting something for pain. Reports he is not drinking.  ------------------------------------------------------------------------    Allergies  Allergen Reactions  . Fentanyl Nausea And Vomiting  . Morphine And Related Shortness Of Breath  . Klonopin [Clonazepam] Other (See Comments)    Dystonic Reaction    . Pimozide Other (See Comments)    Dystonic Reaction   . Stelazine [Trifluoperazine] Other (See Comments)  Dystonic Reaction  . Tramadol Nausea Only     Current Outpatient Medications:  .  acetaminophen (TYLENOL) 500 MG tablet, Take 1,000 mg by mouth every 6 (six) hours as needed., Disp: , Rfl:  .  ALPRAZolam (XANAX) 0.5 MG tablet, Take 0.5 mg by mouth 4 (four) times daily. Patient takes when wakes up(~0600)/ 1000/ 1600/ 2000, Disp: , Rfl:  .  canagliflozin (INVOKANA) 100 MG TABS tablet, Take 1 tablet (100 mg total) by mouth daily before breakfast., Disp: 30 tablet, Rfl: 0 .  carboxymethylcellulose (REFRESH  PLUS) 0.5 % SOLN, Place 2 drops into both eyes as needed (Dry eyes)., Disp: , Rfl:  .  fluticasone (FLONASE) 50 MCG/ACT nasal spray, Place 2 sprays into both nostrils daily., Disp: 16 g, Rfl: 0 .  glucose blood test strip, Use as instructed, Disp: 100 each, Rfl: 12 .  ibuprofen (ADVIL) 600 MG tablet, Take 600 mg by mouth every 6 (six) hours as needed., Disp: , Rfl:  .  Insulin Glargine (LANTUS) 100 UNIT/ML Solostar Pen, Inject 10 Units into the skin at bedtime. , Disp: , Rfl:  .  Insulin Pen Needle (UNIFINE PENTIPS) 32G X 4 MM MISC, USE WITH INSULIN ONCE DAILY, Disp: 90 each, Rfl: 1 .  metFORMIN (GLUCOPHAGE-XR) 500 MG 24 hr tablet, Take 2 tablets (1,000 mg total) by mouth 2 (two) times daily., Disp: 360 tablet, Rfl: 1 .  ofloxacin (FLOXIN OTIC) 0.3 % OTIC solution, Place 5 drops into the right ear 2 (two) times daily. For 7 days total, Disp: 10 mL, Rfl: 0 .  OLANZapine (ZYPREXA) 15 MG tablet, Take 15 mg by mouth at bedtime. At 2000, Disp: , Rfl:  .  ondansetron (ZOFRAN) 8 MG tablet, Take 1 tablet (8 mg total) by mouth every 8 (eight) hours as needed for nausea or vomiting., Disp: 20 tablet, Rfl: 0 .  OneTouch Delica Lancets 99991111 MISC, Use to check fasting sugar once in AM and once before bed., Disp: 100 each, Rfl: 1 .  sertraline (ZOLOFT) 100 MG tablet, Take 100 mg by mouth daily. , Disp: , Rfl:  .  simvastatin (ZOCOR) 40 MG tablet, TAKE 1 TABLET BY MOUTH ONCE DAILY (Patient taking differently: 40 mg daily at 6 PM. ), Disp: 90 tablet, Rfl: 0 .  albuterol (VENTOLIN HFA) 108 (90 Base) MCG/ACT inhaler, Inhale 2 puffs into the lungs every 6 (six) hours as needed., Disp: 8 g, Rfl: 3  Review of Systems  Constitutional: Negative.   Respiratory: Positive for cough.   Allergic/Immunologic: Negative.   Neurological: Negative.     Social History   Tobacco Use  . Smoking status: Current Every Day Smoker    Packs/day: 1.50    Years: 30.00    Pack years: 45.00    Types: Cigarettes  . Smokeless  tobacco: Former Systems developer    Types: Snuff  Substance Use Topics  . Alcohol use: Yes    Alcohol/week: 6.0 standard drinks    Types: 6 Cans of beer per week    Comment: every 2 days      Objective:   There were no vitals taken for this visit. There were no vitals filed for this visit.There is no height or weight on file to calculate BMI.   Physical Exam   No results found for any visits on 11/16/19.     Assessment & Plan    1. Chronic obstructive pulmonary disease, unspecified COPD type ([place])   2. Bipolar depression ([place])   3. Hyperlipidemia associated with type  2 diabetes mellitus ([place])  Check labs as below.  - CBC with Differential - Lipid Profile - Comprehensive Metabolic Panel (CMET) - TSH - HgB A1c - simvastatin (ZOCOR) 40 MG tablet; Take 1 tablet (40 mg total) by mouth daily.  Dispense: 90 tablet; Refill: 0  4. Generalized abdominal pain  Counseled patient that he can take NSAIDs for pain and do clear liquid diet. However if it is pancreatitis he would need to be evaluated in person, likely in the ER.  - Lipase - Amylase  5. Type 2 diabetes mellitus with hyperglycemia, without long-term current use of insulin (HCC)  - CBC with Differential - Lipid Profile - Comprehensive Metabolic Panel (CMET) - TSH - HgB A1c - canagliflozin (INVOKANA) 100 MG TABS tablet; Take 1 tablet (100 mg total) by mouth daily before breakfast.  Dispense: 90 tablet; Refill: 0 - Ambulatory referral to diabetic education I discussed the assessment and treatment plan with the patient. The patient was provided an opportunity to ask questions and all were answered. The patient agreed with the plan and demonstrated an understanding of the instructions.   The patient was advised to call back or seek an in-person evaluation if the symptoms worsen or if the condition fails to improve as anticipated.  I provided 25 minutes of non-face-to-face time during this encounter.  The entirety of the  information documented in the History of Present Illness, Review of Systems and Physical Exam were personally obtained by me. Portions of this information were initially documented by [facility] and reviewed by me for thoroughness and accuracy.      Trinna Post, PA-C  [facility]

## 2019-11-16 NOTE — Patient Instructions (Signed)

## 2019-11-17 ENCOUNTER — Telehealth: Payer: Self-pay

## 2019-11-20 ENCOUNTER — Ambulatory Visit: Payer: Medicare Other | Admitting: *Deleted

## 2019-11-26 ENCOUNTER — Telehealth: Payer: Self-pay | Admitting: Physician Assistant

## 2019-11-26 NOTE — Telephone Encounter (Signed)
A Prior Shaun Odonnell has been generated and sent to the pharmacy. [id number]

## 2019-11-26 NOTE — Telephone Encounter (Signed)
Pt called saying that the Invokana that was sent to [facility] for 90 pills was delivered to patient on 1/11/2.  It was only 30 pills.  Patient got a letter saying the Invokana needs a PA and that's why he only received the 30 pills. They told him that the provider needs to call [phone].  This is to [employer].  This is where the PA needs to be approved/

## 2019-11-27 ENCOUNTER — Telehealth: Payer: Self-pay

## 2019-11-27 NOTE — Telephone Encounter (Signed)
Spoke to Garza-Salinas II w/ [employer] and she states that now the Anastasio Auerbach is not covered under the patient insurance plan but the Wilder Glade is now covered. I explained to Lelon Frohlich that a PA was done 09/15/2019 for Wilder Glade and was denied and stated that patient need to try Invokana. She stated she did see that in the patient's records and asked her manager and the manager advised her that sometimes the medication does get switched around. I advised patient of what was going on and he stated that another provider stated that Fabio Bering would have to call and let them know now the patient really needs his medication for health reason and if he keep having to switch his medication it could be harmful to his health or body.FYI

## 2019-11-30 ENCOUNTER — Telehealth: Payer: Self-pay

## 2019-11-30 NOTE — Telephone Encounter (Signed)
[id number] 

## 2019-11-30 NOTE — Telephone Encounter (Signed)
Copied from [place] [phone]. Topic: General - Other >> Nov 30, 2019  3:47 PM Celene Kras wrote: Reason for CRM: [facility] appeals, called and is requesting to speak with PCPs assistant. Please advise.   [phone]

## 2019-11-30 NOTE — Telephone Encounter (Signed)
Can the MA try calling first and to peer to peer with my verba and state patient had intolerable side effects on jardianxe and is now doing better on invokana to control blood sugar thanks

## 2019-11-30 NOTE — Telephone Encounter (Signed)
Edison Nasuti from [employer] member service is calling concerning PA for invokana and they must have authorization by midnight

## 2019-11-30 NOTE — Telephone Encounter (Signed)
Pt called in to update provider, pt says that he was told by insurance to have PCP to call insurance at:  [phone]  For a Overturn on denial for medication. Pt says that he was told that a Peer to peer review is needed.    [id number] - authorization number  Pt says that he can only take Invokana  To explain the necessity of medication. Pt says that he is unable to take the other medications that were given.  Pt says that PA has to be completed before midnight tonight.       Pt would like a call back: [phone]

## 2019-11-30 NOTE — Telephone Encounter (Signed)
See telephone encounter in progress. KW

## 2019-12-02 NOTE — Telephone Encounter (Signed)
Patients PA was approved. 

## 2019-12-03 ENCOUNTER — Other Ambulatory Visit: Payer: Self-pay | Admitting: Physician Assistant

## 2019-12-03 ENCOUNTER — Telehealth: Payer: Self-pay | Admitting: Physician Assistant

## 2019-12-03 NOTE — Telephone Encounter (Signed)
Pt aware invokana has been approved and will pick up remaining 60 tabs at [facility].

## 2019-12-03 NOTE — Telephone Encounter (Signed)
Pt request refill  fluticasone (FLONASE) 50 MCG/ACT nasal spray  [facility] - [place], [place] [facility] [address]. Phone:  [phone]  Fax:  [phone]

## 2019-12-10 ENCOUNTER — Other Ambulatory Visit: Payer: Self-pay

## 2019-12-10 ENCOUNTER — Encounter: Payer: Medicare Other | Attending: Physician Assistant | Admitting: *Deleted

## 2019-12-10 ENCOUNTER — Encounter: Payer: Self-pay | Admitting: *Deleted

## 2019-12-10 VITALS — BP 110/70 | Ht 68.0 in | Wt 182.5 lb

## 2019-12-10 DIAGNOSIS — Z713 Dietary counseling and surveillance: Secondary | ICD-10-CM | POA: Diagnosis not present

## 2019-12-10 DIAGNOSIS — E1165 Type 2 diabetes mellitus with hyperglycemia: Secondary | ICD-10-CM

## 2019-12-10 NOTE — Patient Instructions (Addendum)
Check blood sugars 2 x day before breakfast and 2 hrs after one meal every day Bring blood sugar records to the next appointment  Exercise: Begin walking for  10-15  minutes  3  days a week and gradually increase 150 minutes per week  Eat 3 meals day,  2  snacks a day Space meals 4-6 hours apart Limit fried foods and desserts/sweets Avoid sugar sweetened drinks (tea) unless treating a low blood sugar Complete 3 Day Food Record and bring to next appt  Quit smoking  Carry fast acting glucose and a snack at all times Rotate injection sites  Return for appointment on:  Thursday December 24, 2019 at 1:30 pm with [facility] (dietitian)

## 2019-12-10 NOTE — Progress Notes (Signed)
Diabetes Self-Management Education  Visit Type: First/Initial  Appt. Start Time: 1315 Appt. End Time: [id number]  12/10/2019  [name], identified by name and date of birth, is a 54 y.o. male with a diagnosis of Diabetes: Type 2.   ASSESSMENT  Blood pressure 110/70, height 5\' 8"  (1.727 m), weight 182 lb 8 oz (82.8 kg). Body mass index is 27.75 kg/m.  Diabetes Self-Management Education - 12/10/19 1520      Visit Information   Visit Type  First/Initial      Initial Visit   Diabetes Type  Type 2    Are you currently following a meal plan?  Yes    What type of meal plan do you follow?  "low starch, low carb, little sugar, higher protein    Are you taking your medications as prescribed?  Yes    Date Diagnosed  9 months ago      Health Coping   How would you rate your overall health?  Good      Psychosocial Assessment   Patient Belief/Attitude about Diabetes  Motivated to manage diabetes    Self-care barriers  None    Self-management support  Doctor's office;Family    Patient Concerns  Nutrition/Meal planning;Glycemic Control;Healthy Lifestyle    Special Needs  None    Preferred Learning Style  Visual;Auditory    Learning Readiness  Change in progress    How often do you need to have someone help you when you read instructions, pamphlets, or other written materials from your doctor or pharmacy?  1 - Never    What is the last grade level you completed in school?  Bachelors      Pre-Education Assessment   Patient understands the diabetes disease and treatment process.  Needs Instruction    Patient understands incorporating nutritional management into lifestyle.  Needs Instruction    Patient undertands incorporating physical activity into lifestyle.  Needs Instruction    Patient understands using medications safely.  Needs Review    Patient understands monitoring blood glucose, interpreting and using results  Needs Review    Patient understands prevention, detection, and  treatment of acute complications.  Needs Instruction    Patient understands prevention, detection, and treatment of chronic complications.  Needs Instruction    Patient understands how to develop strategies to address psychosocial issues.  Needs Instruction    Patient understands how to develop strategies to promote health/change behavior.  Needs Instruction      Complications   Last HgB A1C per patient/outside source  9.1 %   09/14/2019   How often do you check your blood sugar?  1-2 times/day    Fasting Blood glucose range (mg/dL)  70-129;130-179   Pt reports FBG's 126-160's mg/dL   Postprandial Blood glucose range (mg/dL)  180-200;>200   Pt reports pm readings 180-256 mg/dL   Have you had a dilated eye exam in the past 12 months?  Yes    Have you had a dental exam in the past 12 months?  Yes    Are you checking your feet?  Yes    How many days per week are you checking your feet?  7      Dietary Intake   Breakfast  6:00 am - whole grain bagel with cream cheese then he has meal at 9:00 am - cereal and milk or bacon, sausage, 4-6 eggs, grits, toast    Lunch  12 noon - chicken or cheese or bologna sandwich    [employer]  pork chop, beef, chicken, occasional fish - potatoes, peas, beans, corn, brown rice, whole grain pasta, green beans, broccoli, cauliflower, carrots, bag of salad    Snack (evening)  peanut butter and banana sandwich on whole wheat    Beverage(s)  water, tea with sugar, diet soda, cofee with SF creamer      Exercise   Exercise Type  ADL's      Patient Education   Previous Diabetes Education  No    Disease state   Definition of diabetes, type 1 and 2, and the diagnosis of diabetes;Factors that contribute to the development of diabetes    Nutrition management   Role of diet in the treatment of diabetes and the relationship between the three main macronutrients and blood glucose level;Food label reading, portion sizes and measuring food.;Carbohydrate counting;Reviewed blood  glucose goals for pre and post meals and how to evaluate the patients' food intake on their blood glucose level.    Physical activity and exercise   Role of exercise on diabetes management, blood pressure control and cardiac health.    Medications  Taught/reviewed insulin injection, site rotation, insulin storage and needle disposal.;Reviewed patients medication for diabetes, action, purpose, timing of dose and side effects.    Monitoring  Purpose and frequency of SMBG.;Taught/discussed recording of test results and interpretation of SMBG.;Identified appropriate SMBG and/or A1C goals.    Acute complications  Taught treatment of hypoglycemia - the 15 rule.    Chronic complications  Relationship between chronic complications and blood glucose control    Psychosocial adjustment  Identified and addressed patients feelings and concerns about diabetes;Role of stress on diabetes    Personal strategies to promote health  Review risk of smoking and offered smoking cessation      Individualized Goals (developed by patient)   Reducing Risk Improve blood sugars Lead a healthier lifestyle Become more fit Quit smoking     Outcomes   Expected Outcomes  Demonstrated interest in learning. Expect positive outcomes    Future DMSE  2 wks       Individualized Plan for Diabetes Self-Management Training:   Learning Objective:  Patient will have a greater understanding of diabetes self-management. Patient education plan is to attend individual and/or group sessions per assessed needs and concerns.   Plan:   Patient Instructions  Check blood sugars 2 x day before breakfast and 2 hrs after one meal every day Bring blood sugar records to the next appointment Exercise: Begin walking for  10-15  minutes  3  days a week and gradually increase 150 minutes per week Eat 3 meals day,  2  snacks a day Space meals 4-6 hours apart Limit fried foods and desserts/sweets Avoid sugar sweetened drinks (tea) unless treating  a low blood sugar Complete 3 Day Food Record and bring to next appt Quit smoking Carry fast acting glucose and a snack at all times Rotate injection sites Return for appointment on:  Thursday December 24, 2019 at 1:30 pm with Pam (dietitian)  Expected Outcomes:  Demonstrated interest in learning. Expect positive outcomes  Education material provided: General Meal Planning Guidelines Simple Meal Plan 3 Day Food Record Glucose tablets Symptoms, causes and treatments of Hypoglycemia  If problems or questions, patient to contact team via:  Johny Drilling, [place], [place], CDE [phone]  Future DSME appointment: 2 wks  Due to COVID the patient doesn't want to come to Diabetes classes. He agreed to attend the 2 Hour Refresher Program. His last appointment is scheduled for  December 24, 2019 with the dietitian

## 2019-12-11 ENCOUNTER — Other Ambulatory Visit: Payer: Self-pay | Admitting: Physician Assistant

## 2019-12-11 ENCOUNTER — Telehealth: Payer: Self-pay | Admitting: Physician Assistant

## 2019-12-11 DIAGNOSIS — J449 Chronic obstructive pulmonary disease, unspecified: Secondary | ICD-10-CM

## 2019-12-11 MED ORDER — FLUTICASONE PROPIONATE 50 MCG/ACT NA SUSP: NASAL | 0 refills | Status: DC

## 2019-12-11 NOTE — Telephone Encounter (Signed)
Pt has had a visit with the diabetic nutritionist - wants PCP to know

## 2019-12-11 NOTE — Telephone Encounter (Signed)
Patient medication refilled earlier today and sent to [facility].

## 2019-12-11 NOTE — Telephone Encounter (Signed)
FYI

## 2019-12-11 NOTE — Telephone Encounter (Signed)
Pt called requesting for this Rx to have 1 refill, please advise if possible.

## 2019-12-11 NOTE — Telephone Encounter (Signed)
RX REFILL fluticasone (FLONASE) 50 MCG/ACT nasal spray  [facility] - [place], [place]. Phone:  [phone]  Fax:  [phone]

## 2019-12-16 ENCOUNTER — Other Ambulatory Visit: Payer: Self-pay | Admitting: Physician Assistant

## 2019-12-16 ENCOUNTER — Telehealth: Payer: Self-pay | Admitting: Physician Assistant

## 2019-12-16 ENCOUNTER — Ambulatory Visit (INDEPENDENT_AMBULATORY_CARE_PROVIDER_SITE_OTHER): Payer: Medicare Other | Admitting: Physician Assistant

## 2019-12-16 DIAGNOSIS — J309 Allergic rhinitis, unspecified: Secondary | ICD-10-CM | POA: Diagnosis not present

## 2019-12-16 DIAGNOSIS — J449 Chronic obstructive pulmonary disease, unspecified: Secondary | ICD-10-CM

## 2019-12-16 DIAGNOSIS — E1165 Type 2 diabetes mellitus with hyperglycemia: Secondary | ICD-10-CM

## 2019-12-16 DIAGNOSIS — Z5329 Procedure and treatment not carried out because of patient's decision for other reasons: Secondary | ICD-10-CM

## 2019-12-16 MED ORDER — AZELASTINE-FLUTICASONE 137-50 MCG/ACT NA SUSP: NASAL | 1 refills | Status: DC

## 2019-12-16 NOTE — Telephone Encounter (Signed)
Appointment 3/16- refilled per protocol

## 2019-12-16 NOTE — Telephone Encounter (Signed)
Requested medication (s) are due for refill today -yes  Requested medication (s) are on the active medication list -yes  Future visit scheduled -today  Last refill: 12/11/19  Notes to clinic: Patient has appointment today- requesting refill of medication  Requested Prescriptions  Pending Prescriptions Disp Refills   fluticasone (FLONASE) 50 MCG/ACT nasal spray [Pharmacy Med Name: FLUTICASONE PROPIONATE 50 MCG/ACT N] 16 g 0    Sig: USE 2 SPRAYS INTO EACH NOSTRIL ONCE DAILY      Ear, Nose, and Throat: Nasal Preparations - Corticosteroids Passed - 12/16/2019  2:41 PM      Passed - Valid encounter within last 12 months    Recent Outpatient Visits           Today No-show for appointment   [facility] Carles Collet M, PA-C   1 month ago Generalized abdominal pain   [facility] Carles Collet M, [place]   2 months ago Right ear pain   [facility] Carles Collet M, PA-C   3 months ago Type 2 diabetes mellitus with hyperglycemia, without long-term current use of insulin [facility])   [place], [place], PA-C   5 months ago Type 2 diabetes mellitus with hyperglycemia, without long-term current use of insulin [facility])   [facility], Wendee Beavers, [place]       Future Appointments             In 1 month Terrilee Croak, Wendee Beavers, PA-C [employer] [facility]                Requested Prescriptions  Pending Prescriptions Disp Refills   fluticasone (FLONASE) 50 MCG/ACT nasal spray [Pharmacy Med Name: FLUTICASONE PROPIONATE 50 MCG/ACT N] 16 g 0    Sig: USE 2 SPRAYS INTO EACH NOSTRIL ONCE DAILY      Ear, Nose, and Throat: Nasal Preparations - Corticosteroids Passed - 12/16/2019  2:41 PM      Passed - Valid encounter within last 12 months    Recent Outpatient Visits           Today No-show for appointment   [facility] Carles Collet M, PA-C   1 month ago Generalized abdominal pain   [facility] Carles Collet M, [place]   2 months ago Right ear pain   [facility] Carles Collet M, PA-C   3 months ago Type 2 diabetes mellitus with hyperglycemia, without long-term current use of insulin [facility])   [place], [place], PA-C   5 months ago Type 2 diabetes mellitus with hyperglycemia, without long-term current use of insulin [facility])   [place], Wendee Beavers, [place]       Future Appointments             In 1 month Terrilee Croak, Wendee Beavers, PA-C [employer]

## 2019-12-16 NOTE — Telephone Encounter (Signed)
Patient is calling to see if the doctor sent in a script for a medication for his sinuses other than the Flonase.  Patient stated that the doctor said she would send something else in for him to take.  Please advise and call patient at [phone]

## 2019-12-16 NOTE — Progress Notes (Signed)
Patient: Shaun Odonnell Male    DOB: Dec 05, 1965   54 y.o.   MRN: [id number] Visit Date: 12/16/2019  Today's Provider: Trinna Post, PA-C   Chief Complaint  Patient presents with  . Rhinorrhea   Subjective:    Virtual Visit via Telephone Note  I connected with Shaun Odonnell on 12/16/19 at  3:20 PM EST by telephone and verified that I am speaking with the correct person using two identifiers.  Location: Patient: home Provider: Office   I discussed the limitations, risks, security and privacy concerns of performing an evaluation and management service by telephone and the availability of in person appointments. I also discussed with the patient that there may be a patient responsible charge related to this service. The patient expressed understanding and agreed to proceed.   HPI  Rhinorrhea Patient presents today virtually for runny nose. He is currently using Flonase nasal spray and reports it is helping out but would like to increase to two spray per nasal twice daily. He was prescribed flonase 5 days ago but reports he is finished because he is using it so much. Reports the pharmacist told him it could be taken twice daily.    Allergies  Allergen Reactions  . Fentanyl Nausea And Vomiting  . Morphine And Related Shortness Of Breath  . Klonopin [Clonazepam] Other (See Comments)    Dystonic Reaction    . Pimozide Other (See Comments)    Dystonic Reaction   . Stelazine [Trifluoperazine] Other (See Comments)    Dystonic Reaction  . Azithromycin Other (See Comments)    Was told not to take Z pak due to his heart  . Tramadol Nausea Only     Current Outpatient Medications:  .  acetaminophen (TYLENOL) 500 MG tablet, Take 1,000 mg by mouth every 6 (six) hours as needed., Disp: , Rfl:  .  albuterol (VENTOLIN HFA) 108 (90 Base) MCG/ACT inhaler, Inhale 2 puffs into the lungs every 6 (six) hours as needed for wheezing or shortness of breath., Disp: , Rfl:  .   ALPRAZolam (XANAX) 0.5 MG tablet, Take 0.5 mg by mouth 4 (four) times daily. Patient takes when wakes up(~0600)/ 1000/ 1600/ 2000, Disp: , Rfl:  .  carboxymethylcellulose (REFRESH PLUS) 0.5 % SOLN, Place 2 drops into both eyes as needed (Dry eyes)., Disp: , Rfl:  .  fluticasone (FLONASE) 50 MCG/ACT nasal spray, PLACE 2 SPRAYS IN EACH NOSTRIL ONCE DAILY, Disp: 16 g, Rfl: 0 .  glucose blood test strip, Use as instructed, Disp: 100 each, Rfl: 12 .  ibuprofen (ADVIL) 600 MG tablet, Take 600 mg by mouth every 6 (six) hours as needed., Disp: , Rfl:  .  Insulin Glargine (LANTUS) 100 UNIT/ML Solostar Pen, Inject 10 Units into the skin at bedtime. , Disp: , Rfl:  .  Insulin Pen Needle (UNIFINE PENTIPS) 32G X 4 MM MISC, USE WITH INSULIN ONCE DAILY, Disp: 90 each, Rfl: 1 .  INVOKANA 100 MG TABS tablet, TAKE 1 TABLET BY MOUTH ONCE DAILY BEFOREBREAKFAST, Disp: 90 tablet, Rfl: 0 .  metFORMIN (GLUCOPHAGE-XR) 500 MG 24 hr tablet, Take 2 tablets (1,000 mg total) by mouth 2 (two) times daily., Disp: 360 tablet, Rfl: 1 .  Multiple Vitamins-Minerals (CENTRUM SILVER PO), Take 1 tablet by mouth daily., Disp: , Rfl:  .  OLANZapine (ZYPREXA) 15 MG tablet, Take 15 mg by mouth at bedtime. At 2000, Disp: , Rfl:  .  ondansetron (ZOFRAN) 8 MG tablet, Take 1  tablet (8 mg total) by mouth every 8 (eight) hours as needed for nausea or vomiting., Disp: 20 tablet, Rfl: 0 .  OneTouch Delica Lancets [id number] MISC, Use to check fasting sugar once in AM and once before bed., Disp: 100 each, Rfl: 1 .  sertraline (ZOLOFT) 100 MG tablet, Take 100 mg by mouth daily. , Disp: , Rfl:  .  simvastatin (ZOCOR) 40 MG tablet, Take 1 tablet (40 mg total) by mouth daily., Disp: 90 tablet, Rfl: 0 .  ofloxacin (FLOXIN OTIC) 0.3 % OTIC solution, Place 5 drops into the right ear 2 (two) times daily. For 7 days total (Patient not taking: Reported on 12/10/2019), Disp: 10 mL, Rfl: 0  Review of Systems  Constitutional: Negative for diaphoresis and fever.  HENT:  Positive for rhinorrhea. Negative for congestion, sinus pressure, sinus pain and sneezing.   Respiratory: Negative for cough, chest tightness, shortness of breath and wheezing.   Neurological: Negative for headaches.    Social History   Tobacco Use  . Smoking status: Current Every Day Smoker    Packs/day: 1.50    Years: 30.00    Pack years: 45.00    Types: Cigarettes  . Smokeless tobacco: Former Systems developer    Types: Snuff  Substance Use Topics  . Alcohol use: Yes    Alcohol/week: 6.0 standard drinks    Types: 6 Cans of beer per week    Comment: every 2 days      Objective:   There were no vitals taken for this visit. There were no vitals filed for this visit.There is no height or weight on file to calculate BMI.   Physical Exam Constitutional:      Comments: Patient is slurring his words and asks the same questions multiple times. He sounds intoxicated.       No results found for any visits on 12/16/19.     Assessment & Plan    1. Allergic rhinitis, unspecified seasonality, unspecified trigger  Of note, patient was not available for phone call during his allotted appointment time earlier today and this will be counted as a No Show.  Patient reports he is done with the flonase. Advised him he shouldn't be done in 5 day and he is using it too much. I have explained in depth multiple times that I have written it for the max dose at [place] which is the same dose as ONE SPRAY TWICE DAILY. Patient is fixated on the dose being twice a day and doesn't seem to comprehend he is already at the maximum dose. Says he is willing to pay out of pocket for it and I advised him it would be more than the recommended dose. Change as below. Patient sounds intoxicated throughout the entire phone visit, slurring his words and asking me the same questions which I answer in the same way. Questionable how much of this he comprehends.   - Azelastine-Fluticasone 137-50 MCG/ACT SUSP; 1 spray  each nostril twice daily.  Dispense: 23 g; Refill: 1  I discussed the assessment and treatment plan with the patient. The patient was provided an opportunity to ask questions and all were answered. The patient agreed with the plan and demonstrated an understanding of the instructions.   The patient was advised to call back or seek an in-person evaluation if the symptoms worsen or if the condition fails to improve as anticipated.  I provided 15 minutes of non-face-to-face time during this encounter.     Trinna Post,  PA-C  [facility]

## 2019-12-16 NOTE — Progress Notes (Deleted)
Patient: Shaun Odonnell Male    DOB: [birth year]   54 y.o.   MRN: [id number] Visit Date: 12/16/2019  Today's Provider: Trinna Post, PA-C   Chief Complaint  Patient presents with  . Rhinorrhea   Subjective:     HPI  Rhinorrhea Patient presents today for runny nose.   Allergies  Allergen Reactions  . Fentanyl Nausea And Vomiting  . Morphine And Related Shortness Of Breath  . Klonopin [Clonazepam] Other (See Comments)    Dystonic Reaction    . Pimozide Other (See Comments)    Dystonic Reaction   . Stelazine [Trifluoperazine] Other (See Comments)    Dystonic Reaction  . Azithromycin Other (See Comments)    Was told not to take Z pak due to his heart  . Tramadol Nausea Only     Current Outpatient Medications:  .  acetaminophen (TYLENOL) 500 MG tablet, Take 1,000 mg by mouth every 6 (six) hours as needed., Disp: , Rfl:  .  albuterol (VENTOLIN HFA) 108 (90 Base) MCG/ACT inhaler, Inhale 2 puffs into the lungs every 6 (six) hours as needed for wheezing or shortness of breath., Disp: , Rfl:  .  ALPRAZolam (XANAX) 0.5 MG tablet, Take 0.5 mg by mouth 4 (four) times daily. Patient takes when wakes up(~0600)/ 1000/ 1600/ 2000, Disp: , Rfl:  .  carboxymethylcellulose (REFRESH PLUS) 0.5 % SOLN, Place 2 drops into both eyes as needed (Dry eyes)., Disp: , Rfl:  .  fluticasone (FLONASE) 50 MCG/ACT nasal spray, PLACE 2 SPRAYS IN EACH NOSTRIL ONCE DAILY, Disp: 16 g, Rfl: 0 .  glucose blood test strip, Use as instructed, Disp: 100 each, Rfl: 12 .  ibuprofen (ADVIL) 600 MG tablet, Take 600 mg by mouth every 6 (six) hours as needed., Disp: , Rfl:  .  Insulin Glargine (LANTUS) 100 UNIT/ML Solostar Pen, Inject 10 Units into the skin at bedtime. , Disp: , Rfl:  .  Insulin Pen Needle (UNIFINE PENTIPS) 32G X 4 MM MISC, USE WITH INSULIN ONCE DAILY, Disp: 90 each, Rfl: 1 .  INVOKANA 100 MG TABS tablet, TAKE 1 TABLET BY MOUTH ONCE DAILY BEFOREBREAKFAST, Disp: 90 tablet, Rfl: 0 .   metFORMIN (GLUCOPHAGE-XR) 500 MG 24 hr tablet, Take 2 tablets (1,000 mg total) by mouth 2 (two) times daily., Disp: 360 tablet, Rfl: 1 .  Multiple Vitamins-Minerals (CENTRUM SILVER PO), Take 1 tablet by mouth daily., Disp: , Rfl:  .  ofloxacin (FLOXIN OTIC) 0.3 % OTIC solution, Place 5 drops into the right ear 2 (two) times daily. For 7 days total (Patient not taking: Reported on 12/10/2019), Disp: 10 mL, Rfl: 0 .  OLANZapine (ZYPREXA) 15 MG tablet, Take 15 mg by mouth at bedtime. At 2000, Disp: , Rfl:  .  ondansetron (ZOFRAN) 8 MG tablet, Take 1 tablet (8 mg total) by mouth every 8 (eight) hours as needed for nausea or vomiting., Disp: 20 tablet, Rfl: 0 .  OneTouch Delica Lancets [id number] MISC, Use to check fasting sugar once in AM and once before bed., Disp: 100 each, Rfl: 1 .  sertraline (ZOLOFT) 100 MG tablet, Take 100 mg by mouth daily. , Disp: , Rfl:  .  simvastatin (ZOCOR) 40 MG tablet, Take 1 tablet (40 mg total) by mouth daily., Disp: 90 tablet, Rfl: 0  Review of Systems  Constitutional: Negative.   HENT: Positive for rhinorrhea.   Respiratory: Negative.     Social History   Tobacco Use  . Smoking status: Current  Every Day Smoker    Packs/day: 1.50    Years: 30.00    Pack years: 45.00    Types: Cigarettes  . Smokeless tobacco: Former Systems developer    Types: Snuff  Substance Use Topics  . Alcohol use: Yes    Alcohol/week: 6.0 standard drinks    Types: 6 Cans of beer per week    Comment: every 2 days      Objective:   There were no vitals taken for this visit. There were no vitals filed for this visit.There is no height or weight on file to calculate BMI.   Physical Exam   No results found for any visits on 12/16/19.     Assessment & [place], PA-C  [facility]

## 2019-12-16 NOTE — Telephone Encounter (Signed)
Patient was advised via virtual visit today, 12/16/2019 that medication will be called into pharmacy.

## 2019-12-16 NOTE — Progress Notes (Signed)
CMA called x2 and I called at 2:13 PM to start visit with no answer. Left message to call back. No show.

## 2019-12-17 ENCOUNTER — Other Ambulatory Visit: Payer: Self-pay | Admitting: Physician Assistant

## 2019-12-17 ENCOUNTER — Other Ambulatory Visit: Payer: Self-pay

## 2019-12-17 DIAGNOSIS — E1165 Type 2 diabetes mellitus with hyperglycemia: Secondary | ICD-10-CM

## 2019-12-17 DIAGNOSIS — E1169 Type 2 diabetes mellitus with other specified complication: Secondary | ICD-10-CM

## 2019-12-17 DIAGNOSIS — E785 Hyperlipidemia, unspecified: Secondary | ICD-10-CM

## 2019-12-17 MED ORDER — GLUCOSE BLOOD VI STRP: ORAL_STRIP | 12 refills | Status: DC

## 2019-12-17 NOTE — Telephone Encounter (Signed)
Medication Refill - Medication: glucose blood test strip  Patient is totally out of test strips.   Preferred Pharmacy (with phone number or street name):  [facility], [place]. Phone:  [phone]  Fax:  [phone]       Agent: Please be advised that RX refills may take up to 3 business days. We ask that you follow-up with your pharmacy.

## 2019-12-18 NOTE — Patient Instructions (Signed)
Allergies, Adult An allergy means that your body reacts to something that bothers it (allergen). It is not a normal reaction. This can happen from something that you:  Eat.  Breathe in.  Touch. You can have an allergy (be allergic) to:  Outdoor things, like: ? Pollen. ? Grass. ? Weeds.  Indoor things, like: ? Dust. ? Smoke. ? Pet dander.  Foods.  Medicines.  Things that bother your skin, like: ? Detergents. ? Chemicals. ? Latex.  Perfume.  Bugs. An allergy cannot spread from person to person (is not contagious). Follow these instructions at home:         Stay away from things that you know you are allergic to.  If you have allergies to things in the air, wash out your nose each day. Do it with one of these: ? A salt-water (saline) spray. ? A container (neti pot).  Take over-the-counter and prescription medicines only as told by your doctor.  Keep all follow-up visits as told by your doctor. This is important.  If you are at risk for a very bad allergy reaction (anaphylaxis), keep an auto-injector with you all the time. This is called an epinephrine injection. ? This is pre-measured medicine with a needle. You can put it into your skin by yourself. ? Right after you have a very bad allergy reaction, you or a person with you must give the medicine in less than a few minutes. This is an emergency.  If you have ever had a very bad allergy reaction, wear a medical alert bracelet or necklace. Your very bad allergy should be written on it. Contact a health care provider if:  Your symptoms do not get better with treatment. Get help right away if:  You have symptoms of a very bad allergy reaction. These include: ? A swollen mouth, tongue, or throat. ? Pain or tightness in your chest. ? Trouble breathing. ? Being short of breath. ? Dizziness. ? Fainting. ? Very bad pain in your belly (abdomen). ? Throwing up (vomiting). ? Watery poop  (diarrhea). Summary  An allergy means that your body reacts to something that bothers it (allergen). It is not a normal reaction.  Stay away from things that make your body react.  Take over-the-counter and prescription medicines only as told by your doctor.  If you are at risk for a very bad allergy reaction, carry an auto-injector (epinephrine injection) all the time. Also, wear a medical alert bracelet or necklace so people know about your allergy. This information is not intended to replace advice given to you by your health care provider. Make sure you discuss any questions you have with your health care provider. Document Revised: 02/10/2019 Document Reviewed: 02/04/2017 Elsevier Patient Education  2020 Elsevier Inc.  

## 2019-12-23 ENCOUNTER — Encounter: Payer: Self-pay | Admitting: Dietician

## 2019-12-23 NOTE — Progress Notes (Signed)
Patient's appointment has been rescheduled from 12/24/19 to 12/30/19 due to expected inclement weather.

## 2019-12-24 ENCOUNTER — Ambulatory Visit: Payer: Medicare Other | Admitting: Dietician

## 2019-12-24 ENCOUNTER — Other Ambulatory Visit: Payer: Self-pay | Admitting: Physician Assistant

## 2019-12-24 DIAGNOSIS — J449 Chronic obstructive pulmonary disease, unspecified: Secondary | ICD-10-CM

## 2019-12-28 ENCOUNTER — Ambulatory Visit: Payer: Self-pay

## 2019-12-28 NOTE — Telephone Encounter (Signed)
Pt. Reports he woke up this morning around 0300 and was hungry. Ate a peanut butter and banana sandwich and went back to sleep. This morning when he got up he checked his blood sugar  - fasting 339. Usually [id number]. No symptoms. Has been taking all medications as prescribed. Drinking water this morning and has taken morning medications. Will have low sugar, low carb breakfast and recheck blood sugar. Would like PCP to advise him.  Answer Assessment - Initial Assessment Questions 1. BLOOD GLUCOSE: "What is your blood glucose level?"      339 2. ONSET: "When did you check the blood glucose?"     0725 3. USUAL RANGE: "What is your glucose level usually?" (e.g., usual fasting morning value, usual evening value)     155-289 4. KETONES: "Do you check for ketones (urine or blood test strips)?" If yes, ask: "What does the test show now?"      No 5. TYPE 1 or 2:  "Do you know what type of diabetes you have?"  (e.g., Type 1, Type 2, Gestational; doesn't know)      Type 1 6. INSULIN: "Do you take insulin?" "What type of insulin(s) do you use? What is the mode of delivery? (syringe, pen; injection or pump)?"      Yes 7. DIABETES PILLS: "Do you take any pills for your diabetes?" If yes, ask: "Have you missed taking any pills recently?"     No 8. OTHER SYMPTOMS: "Do you have any symptoms?" (e.g., fever, frequent urination, difficulty breathing, dizziness, weakness, vomiting)     No 9. PREGNANCY: "Is there any chance you are pregnant?" "When was your last menstrual period?"     n/a  Protocols used: DIABETES - HIGH BLOOD SUGAR-A-AH

## 2019-12-28 NOTE — Telephone Encounter (Signed)
Stop eating high sugar foods like peanut butter and banana sandwhiches in the middle of the night and follow up in clinic as directed.

## 2019-12-28 NOTE — Telephone Encounter (Signed)
Patient was advised and states that he will be following up with nutrionist 12/30/19. KW

## 2019-12-30 ENCOUNTER — Encounter: Payer: Medicare Other | Admitting: Dietician

## 2019-12-30 ENCOUNTER — Encounter: Payer: Self-pay | Admitting: Dietician

## 2019-12-30 ENCOUNTER — Other Ambulatory Visit: Payer: Self-pay

## 2019-12-30 VITALS — BP 110/70 | Ht 68.0 in | Wt 183.3 lb

## 2019-12-30 DIAGNOSIS — E1165 Type 2 diabetes mellitus with hyperglycemia: Secondary | ICD-10-CM

## 2019-12-30 NOTE — Progress Notes (Signed)
Diabetes Self-Management Education  Visit Type:  Follow-up  Appt. Start Time: 1320 Appt. End Time: [id number]  12/30/2019  [name], identified by name and date of birth, is a 54 y.o. male with a diagnosis of Diabetes: Type 2.    ASSESSMENT  Blood pressure 110/70, height 5\' 8"  (1.727 m), weight 183 lb 4.8 oz (83.1 kg). Body mass index is 27.87 kg/m.   Diabetes Self-Management Education - [id number]      Complications   How often do you check your blood sugar?  1-2 times/day    Fasting Blood glucose range (mg/dL)  130-179;>200   141-250 + one reading of 339   Postprandial Blood glucose range (mg/dL)  130-179;180-200;>200   136-334   Number of hypoglycemic episodes per month  0   2x had BGs of 99; was instructed to drink 4-6oz juice + eat something sweet   Have you had a dilated eye exam in the past 12 months?  Yes    Have you had a dental exam in the past 12 months?  Yes    Are you checking your feet?  Yes    How many days per week are you checking your feet?  7      Dietary Intake   Breakfast  3 meals and 0-1 snack daily    Beverage(s)  water, coffee with surgar free or regular flavored creamer, tea with splenda, gatorade, diet soda  (Pended)       Exercise   Exercise Type  ADL's  (Pended)       Patient Education   Disease state   Explored patient's options for treatment of their diabetes  (Pended)     Nutrition management   Role of diet in the treatment of diabetes and the relationship between the three main macronutrients and blood glucose level;Food label reading, portion sizes and measuring food.;Reviewed blood glucose goals for pre and post meals and how to evaluate the patients' food intake on their blood glucose level.;Meal options for control of blood glucose level and chronic complications.;Other (comment)  (Pended)    basic meal planning for BG control and weight loss   Physical activity and exercise   Role of exercise on diabetes management, blood pressure  control and cardiac health.;Helped patient identify appropriate exercises in relation to his/her diabetes, diabetes complications and other health issue.  (Pended)     Monitoring  Taught/discussed recording of test results and interpretation of SMBG.  (Pended)     Psychosocial adjustment  Role of stress on diabetes  (Pended)       Post-Education Assessment   Patient understands the diabetes disease and treatment process.  Demonstrates understanding / competency  (Pended)     Patient understands incorporating nutritional management into lifestyle.  Demonstrates understanding / competency  (Pended)     Patient undertands incorporating physical activity into lifestyle.  Demonstrates understanding / competency  (Pended)     Patient understands using medications safely.  Demonstrates understanding / competency  (Pended)     Patient understands monitoring blood glucose, interpreting and using results  Demonstrates understanding / competency  (Pended)     Patient understands prevention, detection, and treatment of acute complications.  Demonstrates understanding / competency  (Pended)     Patient understands prevention, detection, and treatment of chronic complications.  Demonstrates understanding / competency  (Pended)     Patient understands how to develop strategies to address psychosocial issues.  Demonstrates understanding / competency  (Pended)     Patient  understands how to develop strategies to promote health/change behavior.  Demonstrates understanding / competency  (Pended)       Outcomes   Program Status  Completed  (Pended)        Learning Objective:  Patient will have a greater understanding of diabetes self-management. Patient education plan is to attend individual and/or group sessions per assessed needs and concerns.  Notes:  Shaun Odonnell seems to be working diligently to control carbohydrate intake and make healthy food choices. He has been reducing portions of starches and  increasing portions of low-carb vegetables.   He is planning to add some exercise at the gym as soon as he is able to receive COVID vaccine. Encouraged gradual increase in daily activity or light exercise, and effects on BGs.  Despite dietary improvements, his BGs remain elevated most days. We discussed non-food factors that affect BGs, and the likelihood his Lantus would need to be increased in order to see significant improvement.   Plan:   Patient Instructions   Limit portions of added fats, especially butter, cream cheese, and sour cream. Try reducing butter from 1 Tablespoon to 1-2 teaspoons to season vegetables or rice.   Great job eating plenty of veggies, as well as working on controlling portions of carb foods and choosing healthy options, keep it up!  Use menus and meal planning notes to help with more meals meeting goal for carb and protein and fat.   Add some light exercise as soon as you are able.     Expected Outcomes:  (P) Demonstrated interest in learning. Expect positive outcomes  Education material provided: Plate planner with food lists; sample menus  If problems or questions, patient to contact team via:  Phone and Email

## 2019-12-30 NOTE — Patient Instructions (Signed)
   Limit portions of added fats, especially butter, cream cheese, and sour cream. Try reducing butter from 1 Tablespoon to 1-2 teaspoons to season vegetables or rice.   Great job eating plenty of veggies, as well as working on controlling portions of carb foods and choosing healthy options, keep it up!  Use menus and meal planning notes to help with more meals meeting goal for carb and protein and fat.   Add some light exercise as soon as you are able.

## 2019-12-31 ENCOUNTER — Ambulatory Visit: Payer: Self-pay

## 2019-12-31 ENCOUNTER — Telehealth: Payer: Self-pay

## 2019-12-31 NOTE — Chronic Care Management (AMB) (Signed)
  Chronic Care Management   Outreach Note  12/31/2019 Name: Shaun Odonnell MRN: [id number] DOB: Mar 28, 1966   Primary Care Provider: Trinna Post, PA-C Reason for referral : Chronic Care Management    An unsuccessful telephone outreach was attempted today. Shaun Odonnell is currently engaged with the Chronic Care Management team. He was scheduled for routine outreach today.   A HIPAA compliant voice message was left requesting a return call.   Follow Up Plan: The care management team will attempt to reach Shaun Odonnell again within the next two weeks.    Horris Latino [facility] Care Management [phone]

## 2020-01-09 ENCOUNTER — Other Ambulatory Visit: Payer: Self-pay | Admitting: Physician Assistant

## 2020-01-09 DIAGNOSIS — J309 Allergic rhinitis, unspecified: Secondary | ICD-10-CM

## 2020-01-09 NOTE — Telephone Encounter (Signed)
Requested Prescriptions  Pending Prescriptions Disp Refills  . Azelastine-Fluticasone 137-50 MCG/ACT SUSP [Pharmacy Med Name: AZELASTINE-FLUTICASONE 137-50 MCG/A] 23 g 0    Sig: USE 1 SPRAY IN EACH NOSTRIL TWICE DAILY     Ear, Nose, and Throat: Nasal Preparations - Corticosteroids Passed - 01/09/2020 10:03 AM      Passed - Valid encounter within last 12 months    Recent Outpatient Visits          3 weeks ago Allergic rhinitis, unspecified seasonality, unspecified trigger   [facility] [place], Wendee [place], PA-C   3 weeks ago No-show for appointment   [facility] [place], Fabio Bering M, [place]   1 month ago Generalized abdominal pain   [place], [place], [place]   3 months ago Right ear pain   [place], [place], PA-C   3 months ago Type 2 diabetes mellitus with hyperglycemia, without long-term current use of insulin [facility])   [facility], Wendee Beavers, [place]      Future Appointments            In 1 week Trinna Post, PA-C [employer], [facility]

## 2020-01-12 ENCOUNTER — Ambulatory Visit: Payer: Self-pay

## 2020-01-12 ENCOUNTER — Other Ambulatory Visit: Payer: Self-pay | Admitting: Physician Assistant

## 2020-01-12 DIAGNOSIS — J309 Allergic rhinitis, unspecified: Secondary | ICD-10-CM

## 2020-01-12 NOTE — Chronic Care Management (AMB) (Signed)
  Chronic Care Management   Note  01/12/2020 Name: Shaun Odonnell MRN: [id number] DOB: Mar 24, 1966    Follow-up outreach with [name] to discuss care management needs. He declined need for chronic case management services again today.   [name] reports a dietician is assisting with nutrition management. Reports being interested in possibly establishing care with an Endocrinologist. Reports being followed by an Endocrinologist in the past. He plans to discuss his options during his upcoming primary care appointment.   PLAN -Will close CCM Enrollment -Will update primary care provider and CCM team.    Horris Latino [facility] Care Management [phone]

## 2020-01-12 NOTE — Telephone Encounter (Signed)
Copied from [place] [id number]. Topic: Quick Communication - Rx Refill/Question >> Jan 12, 2020  2:35 PM Leward Quan A wrote: Medication: Azelastine-Fluticasone 137-50 MCG/ACT SUSP  Per patient his allergies are acting up more than ever before   Has the patient contacted their pharmacy? Yes.   (Agent: If no, request that the patient contact the pharmacy for the refill.) (Agent: If yes, when and what did the pharmacy advise?)  Preferred Pharmacy (with phone number or street name): [facility] [place], [place].  Phone:  [phone] Fax:  [phone]     Agent: Please be advised that RX refills may take up to 3 business days. We ask that you follow-up with your pharmacy.

## 2020-01-12 NOTE — Telephone Encounter (Signed)
Called [facility] regarding prescription request. Order was written 3 days ago and per [facility], this medication was picked up on 01/09/20.  Attempted to call pt but no VM or answer. Will refuse medication at this time.

## 2020-01-19 ENCOUNTER — Ambulatory Visit (INDEPENDENT_AMBULATORY_CARE_PROVIDER_SITE_OTHER): Payer: Medicare Other | Admitting: Physician Assistant

## 2020-01-19 ENCOUNTER — Encounter: Payer: Self-pay | Admitting: Physician Assistant

## 2020-01-19 ENCOUNTER — Other Ambulatory Visit: Payer: Self-pay

## 2020-01-19 VITALS — BP 114/69 | HR 67 | Temp 97.5°F | Wt 187.0 lb

## 2020-01-19 DIAGNOSIS — E785 Hyperlipidemia, unspecified: Secondary | ICD-10-CM | POA: Diagnosis not present

## 2020-01-19 DIAGNOSIS — E1169 Type 2 diabetes mellitus with other specified complication: Secondary | ICD-10-CM

## 2020-01-19 DIAGNOSIS — F1010 Alcohol abuse, uncomplicated: Secondary | ICD-10-CM | POA: Diagnosis not present

## 2020-01-19 DIAGNOSIS — E1165 Type 2 diabetes mellitus with hyperglycemia: Secondary | ICD-10-CM

## 2020-01-19 LAB — POCT GLYCOSYLATED HEMOGLOBIN (HGB A1C): Hemoglobin A1C: 8.8 % — AB (ref 4.0–5.6)

## 2020-01-19 MED ORDER — CANAGLIFLOZIN 300 MG PO TABS: 300.0000 mg | ORAL_TABLET | Freq: Every day | ORAL | 0 refills | Status: DC

## 2020-01-19 MED ORDER — METFORMIN HCL ER 500 MG PO TB24: 1000.0000 mg | ORAL_TABLET | Freq: Two times a day (BID) | ORAL | 1 refills | Status: DC

## 2020-01-19 NOTE — Patient Instructions (Signed)
Diabetes Mellitus and Exercise Exercising regularly is important for your overall health, especially when you have diabetes (diabetes mellitus). Exercising is not only about losing weight. It has many other health benefits, such as increasing muscle strength and bone density and reducing body fat and stress. This leads to improved fitness, flexibility, and endurance, all of which result in better overall health. Exercise has additional benefits for people with diabetes, including:  Reducing appetite.  Helping to lower and control blood glucose.  Lowering blood pressure.  Helping to control amounts of fatty substances (lipids) in the blood, such as cholesterol and triglycerides.  Helping the body to respond better to insulin (improving insulin sensitivity).  Reducing how much insulin the body needs.  Decreasing the risk for heart disease by: ? Lowering cholesterol and triglyceride levels. ? Increasing the levels of good cholesterol. ? Lowering blood glucose levels. What is my activity plan? Your health care provider or certified diabetes educator can help you make a plan for the type and frequency of exercise (activity plan) that works for you. Make sure that you:  Do at least 150 minutes of moderate-intensity or vigorous-intensity exercise each week. This could be brisk walking, biking, or water aerobics. ? Do stretching and strength exercises, such as yoga or weightlifting, at least 2 times a week. ? Spread out your activity over at least 3 days of the week.  Get some form of physical activity every day. ? Do not go more than 2 days in a row without some kind of physical activity. ? Avoid being inactive for more than 30 minutes at a time. Take frequent breaks to walk or stretch.  Choose a type of exercise or activity that you enjoy, and set realistic goals.  Start slowly, and gradually increase the intensity of your exercise over time. What do I need to know about managing my  diabetes?   Check your blood glucose before and after exercising. ? If your blood glucose is 240 mg/dL (13.3 mmol/L) or higher before you exercise, check your urine for ketones. If you have ketones in your urine, do not exercise until your blood glucose returns to normal. ? If your blood glucose is 100 mg/dL (5.6 mmol/L) or lower, eat a snack containing 15-20 grams of carbohydrate. Check your blood glucose 15 minutes after the snack to make sure that your level is above 100 mg/dL (5.6 mmol/L) before you start your exercise.  Know the symptoms of low blood glucose (hypoglycemia) and how to treat it. Your risk for hypoglycemia increases during and after exercise. Common symptoms of hypoglycemia can include: ? Hunger. ? Anxiety. ? Sweating and feeling clammy. ? Confusion. ? Dizziness or feeling light-headed. ? Increased heart rate or palpitations. ? Blurry vision. ? Tingling or numbness around the mouth, lips, or tongue. ? Tremors or shakes. ? Irritability.  Keep a rapid-acting carbohydrate snack available before, during, and after exercise to help prevent or treat hypoglycemia.  Avoid injecting insulin into areas of the body that are going to be exercised. For example, avoid injecting insulin into: ? The arms, when playing tennis. ? The legs, when jogging.  Keep records of your exercise habits. Doing this can help you and your health care provider adjust your diabetes management plan as needed. Write down: ? Food that you eat before and after you exercise. ? Blood glucose levels before and after you exercise. ? The type and amount of exercise you have done. ? When your insulin is expected to peak, if you use   insulin. Avoid exercising at times when your insulin is peaking.  When you start a new exercise or activity, work with your health care provider to make sure the activity is safe for you, and to adjust your insulin, medicines, or food intake as needed.  Drink plenty of water while  you exercise to prevent dehydration or heat stroke. Drink enough fluid to keep your urine clear or pale yellow. Summary  Exercising regularly is important for your overall health, especially when you have diabetes (diabetes mellitus).  Exercising has many health benefits, such as increasing muscle strength and bone density and reducing body fat and stress.  Your health care provider or certified diabetes educator can help you make a plan for the type and frequency of exercise (activity plan) that works for you.  When you start a new exercise or activity, work with your health care provider to make sure the activity is safe for you, and to adjust your insulin, medicines, or food intake as needed. This information is not intended to replace advice given to you by your health care provider. Make sure you discuss any questions you have with your health care provider. Document Revised: 05/16/2017 Document Reviewed: 04/02/2016 Elsevier Patient Education  2020 [employer]vier Inc.  

## 2020-01-19 NOTE — Progress Notes (Signed)
Patient: Shaun Odonnell Male    DOB: 1966-04-12   54 y.o.   MRN: [id number] Visit Date: 01/19/2020  Today's Provider: Trinna Post, PA-C   Chief Complaint  Patient presents with  . Hyperlipidemia  . Diabetes   Subjective:     HPI    Diabetes Mellitus Type II, Follow-up:   Lab Results  Component Value Date   HGBA1C 9.1 (A) 09/14/2019   HGBA1C 9.1 (A) 06/30/2019   HGBA1C 11.7 (H) 04/29/2019   Last seen for diabetes 4 months ago.  Management since then includes No changes. He reports excellent compliance with treatment. He is not having side effects.  Current symptoms include none and have been stable.   Episodes of hypoglycemia? no   Current Insulin Regimen: 10 units lantus nightly. Patient has met with dietician.  Most Recent Eye Exam: Pt has an appointment with the eye dr next week Weight trend: stable Current diet: in general, a "healthy" diet   Current exercise: none  ------------------------------------------------------------------------    Lipid/Cholesterol, Follow-up:   Last seen for this 9 months ago.  Management since that visit includes No changes.  Last Lipid Panel:    Component Value Date/Time   CHOL 180 04/29/2019 1036   CHOL 140 08/03/2014 0620   TRIG 430 (H) 04/29/2019 1036   TRIG 241 (H) 08/03/2014 0620   HDL 33 (L) 04/29/2019 1036   HDL 35 (L) 08/03/2014 0620   CHOLHDL 5.5 (H) 04/29/2019 1036   VLDL 48 (H) 08/03/2014 0620   LDLCALC Comment 04/29/2019 1036   LDLCALC 57 08/03/2014 0620    He reports excellent compliance with treatment. He is not having side effects.   Wt Readings from Last 3 Encounters:  01/19/20 187 lb (84.8 kg)  12/30/19 183 lb 4.8 oz (83.1 kg)  12/10/19 182 lb 8 oz (82.8 kg)   Alcohol Abuse: Continues to drink alcohol. Doesn't think he has an addiction problem because he is not drinking every day.  ------------------------------------------------------------------------      Allergies    Allergen Reactions  . Fentanyl Nausea And Vomiting  . Morphine And Related Shortness Of Breath  . Klonopin [Clonazepam] Other (See Comments)    Dystonic Reaction    . Pimozide Other (See Comments)    Dystonic Reaction   . Stelazine [Trifluoperazine] Other (See Comments)    Dystonic Reaction  . Azithromycin Other (See Comments)    Was told not to take Z pak due to his heart  . Tramadol Nausea Only     Current Outpatient Medications:  .  acetaminophen (TYLENOL) 500 MG tablet, Take 1,000 mg by mouth every 6 (six) hours as needed., Disp: , Rfl:  .  albuterol (VENTOLIN HFA) 108 (90 Base) MCG/ACT inhaler, Inhale 2 puffs into the lungs every 6 (six) hours as needed for wheezing or shortness of breath., Disp: , Rfl:  .  ALPRAZolam (XANAX) 0.5 MG tablet, Take 0.5 mg by mouth 4 (four) times daily. Patient takes when wakes up(~0600)/ 1000/ 1600/ 2000, Disp: , Rfl:  .  Azelastine-Fluticasone 137-50 MCG/ACT SUSP, USE 1 SPRAY IN EACH NOSTRIL TWICE DAILY, Disp: 23 g, Rfl: 0 .  carboxymethylcellulose (REFRESH PLUS) 0.5 % SOLN, Place 2 drops into both eyes as needed (Dry eyes)., Disp: , Rfl:  .  fluticasone (FLONASE) 50 MCG/ACT nasal spray, PLACE 2 SPRAYS IN EACH NOSTRIL ONCE DAILY, Disp: 16 g, Rfl: 0 .  glucose blood test strip, Use as instructed, Disp: 200 each, Rfl: 12 .  ibuprofen (ADVIL) 600 MG tablet, Take 600 mg by mouth every 6 (six) hours as needed., Disp: , Rfl:  .  Insulin Glargine (LANTUS) 100 UNIT/ML Solostar Pen, Inject 10 Units into the skin at bedtime. , Disp: , Rfl:  .  Insulin Pen Needle (UNIFINE PENTIPS) 32G X 4 MM MISC, USE WITH INSULIN ONCE DAILY, Disp: 90 each, Rfl: 1 .  INVOKANA 100 MG TABS tablet, TAKE 1 TABLET BY MOUTH ONCE DAILY BEFOREBREAKFAST, Disp: 90 tablet, Rfl: 0 .  Multiple Vitamins-Minerals (CENTRUM SILVER PO), Take 1 tablet by mouth daily., Disp: , Rfl:  .  OLANZapine (ZYPREXA) 15 MG tablet, Take 15 mg by mouth at bedtime. At 2000, Disp: , Rfl:  .  ondansetron  (ZOFRAN) 8 MG tablet, Take 1 tablet (8 mg total) by mouth every 8 (eight) hours as needed for nausea or vomiting., Disp: 20 tablet, Rfl: 0 .  OneTouch Delica Lancets 26R MISC, Use to check fasting sugar once in AM and once before bed., Disp: 100 each, Rfl: 1 .  sertraline (ZOLOFT) 100 MG tablet, Take 100 mg by mouth daily. , Disp: , Rfl:  .  simvastatin (ZOCOR) 40 MG tablet, Take 1 tablet (40 mg total) by mouth daily., Disp: 90 tablet, Rfl: 0 .  metFORMIN (GLUCOPHAGE-XR) 500 MG 24 hr tablet, Take 2 tablets (1,000 mg total) by mouth 2 (two) times daily., Disp: 360 tablet, Rfl: 1 .  ofloxacin (FLOXIN OTIC) 0.3 % OTIC solution, Place 5 drops into the right ear 2 (two) times daily. For 7 days total (Patient not taking: Reported on 12/10/2019), Disp: 10 mL, Rfl: 0  Review of Systems  Constitutional: Negative.   Respiratory: Negative.   Cardiovascular: Negative.   Gastrointestinal: Negative.   Endocrine: Negative.   Skin: Negative for wound.  Neurological: Negative for dizziness, light-headedness and headaches.    Social History   Tobacco Use  . Smoking status: Current Every Day Smoker    Packs/day: 1.50    Years: 30.00    Pack years: 45.00    Types: Cigarettes  . Smokeless tobacco: Former Systems developer    Types: Snuff  Substance Use Topics  . Alcohol use: Yes    Alcohol/week: 6.0 standard drinks    Types: 6 Cans of beer per week    Comment: 3 beers maybe 2x a week      Objective:   BP 114/69 (BP Location: Left Arm, Patient Position: Sitting, Cuff Size: Large)   Pulse 67   Temp (!) 97.5 F (36.4 C) (Temporal)   Wt 187 lb (84.8 kg)   BMI 28.43 kg/m  Vitals:   01/19/20 1331  BP: 114/69  Pulse: 67  Temp: (!) 97.5 F (36.4 C)  TempSrc: Temporal  Weight: 187 lb (84.8 kg)  Body mass index is 28.43 kg/m.   Physical Exam Constitutional:      Appearance: Normal appearance.  Cardiovascular:     Rate and Rhythm: Normal rate and regular rhythm.     Heart sounds: Normal heart sounds.    Pulmonary:     Effort: Pulmonary effort is normal.     Breath sounds: Normal breath sounds.  Abdominal:     General: Bowel sounds are normal.     Palpations: Abdomen is soft.  Skin:    General: Skin is warm and dry.  Neurological:     Mental Status: He is alert and oriented to person, place, and time. Mental status is at baseline.  Psychiatric:        Mood and  Affect: Mood normal.        Behavior: Behavior normal.      No results found for any visits on 01/19/20.     Assessment & Plan    1. Type 2 diabetes mellitus with hyperglycemia, without long-term current use of insulin (HCC)  Uncontrolled. Increase invokana to 300 mg daily. Continue metformin and insulin as prescribed. Had a long discussion regarding role of diet and exercise. Patient asks if medication increase is necessary in light of dietary modifications. I have advised him it is until which time he can control his sugar with diet, then we can remove medications. This may not be feasible. F/u 3 months.   - POCT glycosylated hemoglobin (Hb A1C) - canagliflozin (INVOKANA) 300 MG TABS tablet; Take 1 tablet (300 mg total) by mouth daily before breakfast.  Dispense: 90 tablet; Refill: 0 - metFORMIN (GLUCOPHAGE-XR) 500 MG 24 hr tablet; Take 2 tablets (1,000 mg total) by mouth 2 (two) times daily.  Dispense: 360 tablet; Refill: 1  2. Hyperlipidemia associated with type 2 diabetes mellitus (HCC)  Continue statin.  3. Alcohol abuse  Continues. Patient does not think he is an addict. I have explained to him that his continued use of alcohol despite negative consequences (repeated hospitalizations for pancreatitis, worsening diabetes) and repeated instructions to stop constitutes abusive behavior. Patient is not interested in medical management for alcohol abuse. In any event he is established with a psychiatrist and can address the issue with that provider should he choose.   The entirety of the information documented in the  History of Present Illness, Review of Systems and Physical Exam were personally obtained by me. Portions of this information were initially documented by Ashley Royalty, CMA and reviewed by me for thoroughness and accuracy.        Trinna Post, PA-C  [facility]

## 2020-01-20 ENCOUNTER — Telehealth: Payer: Self-pay

## 2020-01-20 LAB — COMPREHENSIVE METABOLIC PANEL
ALT: 19 IU/L (ref 0–44)
AST: 17 IU/L (ref 0–40)
Albumin/Globulin Ratio: 1.9 (ref 1.2–2.2)
Albumin: 4.8 g/dL (ref 3.8–4.9)
Alkaline Phosphatase: 95 IU/L (ref 39–117)
BUN/Creatinine Ratio: 12 (ref 9–20)
BUN: 8 mg/dL (ref 6–24)
Bilirubin Total: 0.2 mg/dL (ref 0.0–1.2)
CO2: 27 mmol/L (ref 20–29)
Calcium: 9.8 mg/dL (ref 8.7–10.2)
Chloride: 95 mmol/L — ABNORMAL LOW (ref 96–106)
Creatinine, Ser: 0.67 mg/dL — ABNORMAL LOW (ref 0.76–1.27)
GFR calc Af Amer: 129 mL/min/{1.73_m2} (ref >59)
GFR calc non Af Amer: 111 mL/min/{1.73_m2} (ref >59)
Globulin, Total: 2.5 g/dL (ref 1.5–4.5)
Glucose: 164 mg/dL — ABNORMAL HIGH (ref 65–99)
Potassium: 4.6 mmol/L (ref 3.5–5.2)
Sodium: 137 mmol/L (ref 134–144)
Total Protein: 7.3 g/dL (ref 6.0–8.5)

## 2020-01-20 LAB — CBC WITH DIFFERENTIAL/PLATELET
Basophils Absolute: 0 10*3/uL (ref 0.0–0.2)
Basos: 0 %
EOS (ABSOLUTE): 0.1 10*3/uL (ref 0.0–0.4)
Eos: 1 %
Hematocrit: 49 % (ref 37.5–51.0)
Hemoglobin: 16.9 g/dL (ref 13.0–17.7)
Immature Grans (Abs): 0.1 10*3/uL (ref 0.0–0.1)
Immature Granulocytes: 1 %
Lymphocytes Absolute: 1.6 10*3/uL (ref 0.7–3.1)
Lymphs: 20 %
MCH: 33.3 pg — ABNORMAL HIGH (ref 26.6–33.0)
MCHC: 34.5 g/dL (ref 31.5–35.7)
MCV: 97 fL (ref 79–97)
Monocytes Absolute: 0.7 10*3/uL (ref 0.1–0.9)
Monocytes: 9 %
Neutrophils Absolute: 5.4 10*3/uL (ref 1.4–7.0)
Neutrophils: 69 %
Platelets: 146 10*3/uL — ABNORMAL LOW (ref 150–450)
RBC: 5.08 x10E6/uL (ref 4.14–5.80)
RDW: 12.7 % (ref 11.6–15.4)
WBC: 7.9 10*3/uL (ref 3.4–10.8)

## 2020-01-20 LAB — TSH: TSH: 1.51 u[IU]/mL (ref 0.450–4.500)

## 2020-01-20 LAB — LIPID PANEL
Chol/HDL Ratio: 3.5 ratio (ref 0.0–5.0)
Cholesterol, Total: 162 mg/dL (ref 100–199)
HDL: 46 mg/dL (ref >39)
LDL Chol Calc (NIH): 91 mg/dL (ref 0–99)
Triglycerides: 143 mg/dL (ref 0–149)
VLDL Cholesterol Cal: 25 mg/dL (ref 5–40)

## 2020-01-20 LAB — HEMOGLOBIN A1C
Est. average glucose Bld gHb Est-mCnc: 212 mg/dL
Hgb A1c MFr Bld: 9 % — ABNORMAL HIGH (ref 4.8–5.6)

## 2020-01-20 LAB — AMYLASE: Amylase: 30 U/L — ABNORMAL LOW (ref 31–110)

## 2020-01-20 LAB — LIPASE: Lipase: 19 U/L (ref 13–78)

## 2020-01-20 NOTE — Telephone Encounter (Signed)
-----   Message from Trinna Post, [place] sent at 01/20/2020  9:06 AM EDT ----- His labs are stable. A1c is elevated which we did talk about in clinic. Cholesterol slightly elevated. We can consider increasing cholesterol medicine at next visit. Pancreatic enzymes are normal.

## 2020-01-20 NOTE — Telephone Encounter (Signed)
Called the patient no answer, left a voicemail message to call back. If patient returns call ok for [facility] to advise patient of message.

## 2020-01-20 NOTE — Telephone Encounter (Signed)
Patient returned call- notified of lab results and PCP recommendations. Patient will discuss cholestrol levels at next visit and continue to work on lowering glucose levels

## 2020-01-23 ENCOUNTER — Ambulatory Visit: Payer: Medicare Other | Attending: Internal Medicine

## 2020-01-23 DIAGNOSIS — Z23 Encounter for immunization: Secondary | ICD-10-CM

## 2020-01-23 NOTE — Progress Notes (Signed)
   Covid-19 Vaccination Clinic  Name:  Shaun Odonnell    MRN: [id number] DOB: 1966-03-09  01/23/2020  Shaun Odonnell was observed post Covid-19 immunization for 30 minutes based on pre-vaccination screening without incident. He was provided with Vaccine Information Sheet and instruction to access the V-Safe system.   Shaun Odonnell was instructed to call 911 with any severe reactions post vaccine: Marland Kitchen Difficulty breathing  . Swelling of face and throat  . A fast heartbeat  . A bad rash all over body  . Dizziness and weakness   Immunizations Administered    Name Date Dose VIS Date Route   Pfizer COVID-19 Vaccine 01/23/2020 11:01 AM 0.3 mL 10/16/2019 Intramuscular   Manufacturer: [place]   Lot: [id number]   [place]: [id number]

## 2020-01-25 ENCOUNTER — Other Ambulatory Visit: Payer: Self-pay | Admitting: Physician Assistant

## 2020-01-25 DIAGNOSIS — J309 Allergic rhinitis, unspecified: Secondary | ICD-10-CM

## 2020-01-25 NOTE — Telephone Encounter (Signed)
Copied from [place] [phone]. Topic: Quick Communication - Rx Refill/Question >> Jan 25, 2020  4:53 PM Yvette Rack wrote: Medication: Azelastine-Fluticasone 137-50 MCG/ACT SUSP  Has the patient contacted their pharmacy? no  Preferred Pharmacy (with phone number or street name): [place], [place]. Phone: [phone]  Fax: [phone]  Agent: Please be advised that RX refills may take up to 3 business days. We ask that you follow-up with your pharmacy.

## 2020-01-26 ENCOUNTER — Other Ambulatory Visit: Payer: Self-pay

## 2020-01-26 NOTE — Telephone Encounter (Signed)
Copied from [id number] [phone]. Topic: General - Other >> Jan 25, 2020  4:49 PM Yvette Rack wrote: Reason for CRM: Pt stated he already had his 1st Covid vaccine and his 2nd vaccine is scheduled for mid April. Pt stated he saw his eye doctor today because of pressure in eye and he was advised to continue taking allergy meds. Pt stated he needs a refill of the Azelastine-Fluticasone 137-50 MCG/ACT SUSP with an additional refill because his allergies are really bothering him. Refill request sent to [facility]

## 2020-01-28 ENCOUNTER — Other Ambulatory Visit: Payer: Self-pay | Admitting: Physician Assistant

## 2020-01-28 DIAGNOSIS — E1165 Type 2 diabetes mellitus with hyperglycemia: Secondary | ICD-10-CM

## 2020-01-28 MED ORDER — UNIFINE PENTIPS 32G X 4 MM MISC: 1 refills | Status: DC

## 2020-01-28 NOTE — Telephone Encounter (Signed)
Requested Prescriptions  Pending Prescriptions Disp Refills  . Insulin Pen Needle (UNIFINE PENTIPS) 32G X 4 MM MISC 90 each 1    Sig: USE WITH INSULIN ONCE DAILY     Endocrinology: Diabetes - Testing Supplies Passed - 01/28/2020  2:23 PM      Passed - Valid encounter within last 12 months    Recent Outpatient Visits          1 week ago Type 2 diabetes mellitus with hyperglycemia, without long-term current use of insulin [facility])   [place], [place], PA-C   1 month ago Allergic rhinitis, unspecified seasonality, unspecified trigger   [facility] [place], Wendee Beavers, PA-C   1 month ago No-show for appointment   [place], [place]   2 months ago Generalized abdominal pain   [place], [place], [place]   4 months ago Right ear pain   [facility] Trinna Post, [place]      Future Appointments            In 2 months Trinna Post, [place], [place]

## 2020-01-28 NOTE — Telephone Encounter (Signed)
Medication Refill - Medication:  Insulin Pen Needle (UNIFINE PENTIPS) 32G X 4 MM MISC    Has the patient contacted their pharmacy? Yes advised to call office.   Preferred Pharmacy (with phone number or street name):  [place], [place]. Phone:  [phone]  Fax:  [phone]       Agent: Please be advised that RX refills may take up to 3 business days. We ask that you follow-up with your pharmacy.

## 2020-02-01 ENCOUNTER — Other Ambulatory Visit: Payer: Self-pay | Admitting: Physician Assistant

## 2020-02-01 DIAGNOSIS — J309 Allergic rhinitis, unspecified: Secondary | ICD-10-CM

## 2020-02-12 ENCOUNTER — Other Ambulatory Visit: Payer: Self-pay | Admitting: Physician Assistant

## 2020-02-12 DIAGNOSIS — J309 Allergic rhinitis, unspecified: Secondary | ICD-10-CM

## 2020-02-16 ENCOUNTER — Ambulatory Visit: Payer: Medicare Other | Attending: Internal Medicine

## 2020-02-16 DIAGNOSIS — Z23 Encounter for immunization: Secondary | ICD-10-CM

## 2020-02-16 NOTE — Progress Notes (Signed)
   Covid-19 Vaccination Clinic  Name:  Shaun Odonnell    MRN: [id number] DOB: 1966/09/10  02/16/2020  Mr. Abidi was observed post Covid-19 immunization for 15 minutes without incident. He was provided with Vaccine Information Sheet and instruction to access the V-Safe system.   Mr. Ridder was instructed to call 911 with any severe reactions post vaccine: Marland Kitchen Difficulty breathing  . Swelling of face and throat  . A fast heartbeat  . A bad rash all over body  . Dizziness and weakness   Immunizations Administered    Name Date Dose VIS Date Route   Pfizer COVID-19 Vaccine 02/16/2020  1:17 PM 0.3 mL 10/16/2019 Intramuscular   Manufacturer: [place]   Lot: [id number]   [place]: [id number]

## 2020-02-29 ENCOUNTER — Other Ambulatory Visit: Payer: Self-pay | Admitting: Physician Assistant

## 2020-02-29 DIAGNOSIS — E1169 Type 2 diabetes mellitus with other specified complication: Secondary | ICD-10-CM

## 2020-02-29 DIAGNOSIS — E785 Hyperlipidemia, unspecified: Secondary | ICD-10-CM

## 2020-03-03 ENCOUNTER — Other Ambulatory Visit: Payer: Self-pay | Admitting: Physician Assistant

## 2020-03-03 DIAGNOSIS — R110 Nausea: Secondary | ICD-10-CM

## 2020-03-03 NOTE — Telephone Encounter (Signed)
Requested medication (s) are due for refill today: yes  Requested medication (s) are on the active medication list: yes  Last refill: 08/03/2019  Future visit scheduled: yes  Notes to clinic:  not delegated    Requested Prescriptions  Pending Prescriptions Disp Refills   ondansetron (ZOFRAN-ODT) 8 MG disintegrating tablet [Pharmacy Med Name: ONDANSETRON 8 MG ODT] 20 tablet     Sig: TAKE 1 TABLET BY MOUTH EVERY 8 HOURS AS NEEDED NAUSEA AND VOMITING      Not Delegated - Gastroenterology: Antiemetics Failed - 03/03/2020 11:22 AM      Failed - This refill cannot be delegated      Passed - Valid encounter within last 6 months    Recent Outpatient Visits           1 month ago Type 2 diabetes mellitus with hyperglycemia, without long-term current use of insulin [facility])   [place], [place], PA-C   2 months ago Allergic rhinitis, unspecified seasonality, unspecified trigger   [facility] [place], Wendee Beavers, PA-C   2 months ago No-show for appointment   [place], [place]   3 months ago Generalized abdominal pain   [place], [place], [place]   5 months ago Right ear pain   [facility] Trinna Post, [place]       Future Appointments             In 1 month Terrilee Croak, Wendee Beavers, [place], [facility]

## 2020-04-19 ENCOUNTER — Telehealth: Payer: Self-pay | Admitting: Physician Assistant

## 2020-04-19 DIAGNOSIS — E1165 Type 2 diabetes mellitus with hyperglycemia: Secondary | ICD-10-CM

## 2020-04-19 MED ORDER — CANAGLIFLOZIN 300 MG PO TABS: 300.0000 mg | ORAL_TABLET | Freq: Every day | ORAL | 1 refills | Status: DC

## 2020-04-19 NOTE — Telephone Encounter (Signed)
Pt request refill  canagliflozin (INVOKANA) 100 MG TABS tablet 90 tabs  [facility] - [place], [place]. Phone:  [phone]  Fax:  [phone]

## 2020-04-19 NOTE — Telephone Encounter (Signed)
Pt has requested a refill of his Invokana 100 mg tablets #90.  [facility] in [place].  I do not see where he is on this medication.  He saw Carles Collet and she mentions it in her notes but there is no Rx for it.   Thanks.

## 2020-04-19 NOTE — Telephone Encounter (Signed)
Pt called back to report that he is completely out of his medication. He states that he needs to have this called in as soon as possible. Please advise

## 2020-04-20 NOTE — Telephone Encounter (Signed)
[facility] has also sent Rx request for Invokana 300 mg TAB. Please advise. Thanks TNP

## 2020-04-20 NOTE — Telephone Encounter (Signed)
Was already done yesterday

## 2020-04-21 ENCOUNTER — Ambulatory Visit: Payer: Medicare Other | Admitting: Physician Assistant

## 2020-04-25 NOTE — Progress Notes (Deleted)
Established patient visit   Patient: Shaun Odonnell   DOB: 1966/01/04   54 y.o. Male  MRN: [id number] Visit Date: 04/26/2020  Today's healthcare provider: Trinna Post, PA-C   No chief complaint on file.  Subjective    HPI Diabetes Mellitus Type II, Follow-up  Lab Results  Component Value Date   HGBA1C 9.0 (H) 01/19/2020   HGBA1C 8.8 (A) 01/19/2020   HGBA1C 9.1 (A) 09/14/2019   Wt Readings from Last 3 Encounters:  01/19/20 187 lb (84.8 kg)  12/30/19 183 lb 4.8 oz (83.1 kg)  12/10/19 182 lb 8 oz (82.8 kg)   Last seen for diabetes 3 months ago.  Management since then includes patient was advised to increase invokana to 300 mg daily. He reports {excellent/good/fair/poor:19665} compliance with treatment. He {is/is not:21021397} having side effects. {document side effects if present:1} Symptoms: {Yes/No:20286} fatigue {Yes/No:20286} foot ulcerations  {Yes/No:20286} appetite changes {Yes/No:20286} nausea  {Yes/No:20286} paresthesia of the feet  {Yes/No:20286} polydipsia  {Yes/No:20286} polyuria {Yes/No:20286} visual disturbances   {Yes/No:20286} vomiting     Home blood sugar records: {diabetes glucometry results:16657}  Episodes of hypoglycemia? {Yes/No:20286} {enter symptoms and frequency of symptoms if yes:1}   Current insulin regiment: {enter 'none' or type of insulin and number of units taken with each dose of each insulin formulation that the patient is taking:1} Most Recent Eye Exam: *** {Current exercise:16438:::1} {Current diet habits:16563:::1}  Pertinent Labs: Lab Results  Component Value Date   CHOL 162 01/19/2020   HDL 46 01/19/2020   LDLCALC 91 01/19/2020   TRIG 143 01/19/2020   CHOLHDL 3.5 01/19/2020   Lab Results  Component Value Date   NA 137 01/19/2020   K 4.6 01/19/2020   CREATININE 0.67 (L) 01/19/2020   GFRNONAA 111 01/19/2020   GFRAA 129 01/19/2020   GLUCOSE 164 (H) 01/19/2020      ---------------------------------------------------------------------------------------------------   {Show patient history (optional):23778::" "}   Medications: Outpatient Medications Prior to Visit  Medication Sig  . acetaminophen (TYLENOL) 500 MG tablet Take 1,000 mg by mouth every 6 (six) hours as needed.  Marland Kitchen albuterol (VENTOLIN HFA) 108 (90 Base) MCG/ACT inhaler Inhale 2 puffs into the lungs every 6 (six) hours as needed for wheezing or shortness of breath.  . ALPRAZolam (XANAX) 0.5 MG tablet Take 0.5 mg by mouth 4 (four) times daily. Patient takes when wakes up(~0600)/ 1000/ 1600/ 2000  . Azelastine-Fluticasone 137-50 MCG/ACT SUSP USE 1 SPRAY IN EACH NOSTRIL TWICE DAILY  . canagliflozin (INVOKANA) 300 MG TABS tablet Take 1 tablet (300 mg total) by mouth daily before breakfast.  . carboxymethylcellulose (REFRESH PLUS) 0.5 % SOLN Place 2 drops into both eyes as needed (Dry eyes).  . fluticasone (FLONASE) 50 MCG/ACT nasal spray PLACE 2 SPRAYS IN EACH NOSTRIL ONCE DAILY  . glucose blood test strip Use as instructed  . ibuprofen (ADVIL) 600 MG tablet Take 600 mg by mouth every 6 (six) hours as needed.  . Insulin Glargine (LANTUS) 100 UNIT/ML Solostar Pen Inject 10 Units into the skin at bedtime.   . Insulin Pen Needle (UNIFINE PENTIPS) 32G X 4 MM MISC USE WITH INSULIN ONCE DAILY  . metFORMIN (GLUCOPHAGE-XR) 500 MG 24 hr tablet Take 2 tablets (1,000 mg total) by mouth 2 (two) times daily.  . Multiple Vitamins-Minerals (CENTRUM SILVER PO) Take 1 tablet by mouth daily.  Marland Kitchen OLANZapine (ZYPREXA) 15 MG tablet Take 15 mg by mouth at bedtime. At 2000  . ondansetron (ZOFRAN-ODT) 8 MG disintegrating tablet TAKE 1 TABLET  BY MOUTH EVERY 8 HOURS AS NEEDED NAUSEA AND VOMITING  . OneTouch Delica Lancets 67Y MISC Use to check fasting sugar once in AM and once before bed.  . sertraline (ZOLOFT) 100 MG tablet Take 100 mg by mouth daily.   . simvastatin (ZOCOR) 40 MG tablet TAKE 1 TABLET BY MOUTH ONCE DAILY    No facility-administered medications prior to visit.    Review of Systems  Constitutional: Negative.   Respiratory: Negative.   Cardiovascular: Negative.   Hematological: Negative.     {Heme  Chem  Endocrine  Serology  Results Review (optional):23779::" "}  Objective    There were no vitals taken for this visit. {Show previous vital signs (optional):23777::" "}  Physical Exam  ***  No results found for any visits on 04/26/20.  Assessment & Plan     ***  No follow-ups on file.      {provider attestation***:1}   Paulene Floor  [facility] [phone] (phone) [phone] (fax)  [place]

## 2020-04-26 ENCOUNTER — Ambulatory Visit: Payer: Medicare Other | Admitting: Physician Assistant

## 2020-04-29 ENCOUNTER — Other Ambulatory Visit: Payer: Self-pay | Admitting: Physician Assistant

## 2020-04-29 NOTE — Telephone Encounter (Signed)
Requested medication (s) are due for refill today: yes  Requested medication (s) are on the active medication list: yes  Last refill:  06/22/19 historic provider  Future visit scheduled: yes 05/03/20  Notes to clinic:  Historic provider Patient message is that he is about out      Requested Prescriptions  Pending Prescriptions Disp Refills   insulin glargine (LANTUS) 100 UNIT/ML Solostar Pen 15 mL     Sig: Inject 10 Units into the skin at bedtime.      Endocrinology:  Diabetes - Insulins Failed - 04/29/2020  2:34 PM      Failed - HBA1C is between 0 and 7.9 and within 180 days    Hemoglobin A1C  Date Value Ref Range Status  01/19/2020 8.8 (A) 4.0 - 5.6 % Final  08/03/2014 5.7 4.2 - 6.3 % Final    Comment:    The American Diabetes Association recommends that a primary goal of therapy should be <7% and that physicians should reevaluate the treatment regimen in patients with HbA1c values consistently >8%.    Hgb A1c MFr Bld  Date Value Ref Range Status  01/19/2020 9.0 (H) 4.8 - 5.6 % Final    Comment:             Prediabetes: 5.7 - 6.4          Diabetes: >6.4          Glycemic control for adults with diabetes: <7.0           Passed - Valid encounter within last 6 months    Recent Outpatient Visits           3 months ago Type 2 diabetes mellitus with hyperglycemia, without long-term current use of insulin [facility])   [place], [place], PA-C   4 months ago Allergic rhinitis, unspecified seasonality, unspecified trigger   [place], Wendee Beavers, PA-C   4 months ago No-show for appointment   [place], [place]   5 months ago Generalized abdominal pain   [place], [place], [place]   7 months ago Right ear pain   [facility] Trinna Post, [place]       Future Appointments             In 4 days Trinna Post, [place], [facility]

## 2020-04-29 NOTE — Telephone Encounter (Signed)
Pt is calling and his refills has expired. Pt has about one dose left. Pt needs refill on lantus solostar pen. [facility] in [place]. Pt has an appt on 05-03-2020

## 2020-05-02 ENCOUNTER — Other Ambulatory Visit: Payer: Self-pay

## 2020-05-02 NOTE — Telephone Encounter (Signed)
Incoming request for Lantus, sent to th ewrong office

## 2020-05-03 ENCOUNTER — Other Ambulatory Visit: Payer: Self-pay

## 2020-05-03 ENCOUNTER — Encounter: Payer: Self-pay | Admitting: Physician Assistant

## 2020-05-03 ENCOUNTER — Ambulatory Visit (INDEPENDENT_AMBULATORY_CARE_PROVIDER_SITE_OTHER): Payer: Medicare Other | Admitting: Physician Assistant

## 2020-05-03 VITALS — BP 121/69 | HR 60 | Temp 97.8°F | Resp 16 | Ht 68.0 in | Wt 180.0 lb

## 2020-05-03 DIAGNOSIS — E785 Hyperlipidemia, unspecified: Secondary | ICD-10-CM | POA: Diagnosis not present

## 2020-05-03 DIAGNOSIS — E1169 Type 2 diabetes mellitus with other specified complication: Secondary | ICD-10-CM

## 2020-05-03 DIAGNOSIS — E1165 Type 2 diabetes mellitus with hyperglycemia: Secondary | ICD-10-CM | POA: Diagnosis not present

## 2020-05-03 LAB — POCT GLYCOSYLATED HEMOGLOBIN (HGB A1C): Hemoglobin A1C: 8.4 % — AB (ref 4.0–5.6)

## 2020-05-03 LAB — POCT UA - MICROALBUMIN: Microalbumin Ur, POC: NEGATIVE mg/L

## 2020-05-03 MED ORDER — SITAGLIPTIN PHOSPHATE 100 MG PO TABS: 100.0000 mg | ORAL_TABLET | Freq: Every day | ORAL | 0 refills | Status: DC

## 2020-05-03 MED ORDER — SIMVASTATIN 80 MG PO TABS: 80.0000 mg | ORAL_TABLET | Freq: Every day | ORAL | 0 refills | Status: DC

## 2020-05-03 MED ORDER — INSULIN GLARGINE 100 UNIT/ML SOLOSTAR PEN: 10.0000 [IU] | PEN_INJECTOR | Freq: Every day | SUBCUTANEOUS | 0 refills | Status: DC

## 2020-05-03 NOTE — Progress Notes (Signed)
Established patient visit   Patient: Shaun Odonnell   DOB: 01-06-1966   54 y.o. Male  MRN: [id number] Visit Date: 05/03/2020  Today's healthcare provider: Trinna Post, PA-C   Chief Complaint  Patient presents with  . Diabetes  . Hyperlipidemia   Subjective    HPI  Diabetes Mellitus Type II, Follow-up  Lab Results  Component Value Date   HGBA1C 8.4 (A) 05/03/2020   HGBA1C 9.0 (H) 01/19/2020   HGBA1C 8.8 (A) 01/19/2020   Wt Readings from Last 3 Encounters:  05/03/20 180 lb (81.6 kg)  01/19/20 187 lb (84.8 kg)  12/30/19 183 lb 4.8 oz (83.1 kg)   Last seen for diabetes 3 months ago.  Management since then includes increased invokana to 300 mg daily. He reports fair compliance with treatment. He is not having side effects.  Symptoms: No fatigue No foot ulcerations  No appetite changes No nausea  No paresthesia of the feet  No polydipsia  No polyuria No visual disturbances   No vomiting     Home blood sugar records: not being checked. Reports that it can be around 140s.   Episodes of hypoglycemia? No    Current insulin regiment: Lantus 100 MG, inject 10 units daily. Most Recent Eye Exam: every other year.  Current exercise: none Current diet habits: well balanced  Pertinent Labs: Lab Results  Component Value Date   CHOL 162 01/19/2020   HDL 46 01/19/2020   LDLCALC 91 01/19/2020   TRIG 143 01/19/2020   CHOLHDL 3.5 01/19/2020   Lab Results  Component Value Date   NA 137 01/19/2020   K 4.6 01/19/2020   CREATININE 0.67 (L) 01/19/2020   GFRNONAA 111 01/19/2020   GFRAA 129 01/19/2020   GLUCOSE 164 (H) 01/19/2020      Lipid/Cholesterol, Follow-up  Last lipid panel Other pertinent labs  Lab Results  Component Value Date   CHOL 162 01/19/2020   HDL 46 01/19/2020   LDLCALC 91 01/19/2020   TRIG 143 01/19/2020   CHOLHDL 3.5 01/19/2020   Lab Results  Component Value Date   ALT 19 01/19/2020   AST 17 01/19/2020   PLT 146 (L) 01/19/2020    TSH 1.510 01/19/2020     He was last seen for this 3 months ago.  Management since that visit includes no changes.  He reports good compliance with treatment. He is not having side effects.   Symptoms: No chest pain No chest pressure/discomfort  No dyspnea No lower extremity edema  No numbness or tingling of extremity No orthopnea  No palpitations No paroxysmal nocturnal dyspnea  No speech difficulty No syncope   Current diet: well balanced Current exercise: none  The 10-year ASCVD risk score Mikey Bussing DC Jr., et al., 2013) is: 11.4%      Medications: Outpatient Medications Prior to Visit  Medication Sig  . albuterol (VENTOLIN HFA) 108 (90 Base) MCG/ACT inhaler Inhale 2 puffs into the lungs every 6 (six) hours as needed for wheezing or shortness of breath.  . ALPRAZolam (XANAX) 0.5 MG tablet Take 0.5 mg by mouth 4 (four) times daily. Patient takes when wakes up(~0600)/ 1000/ 1600/ 2000  . canagliflozin (INVOKANA) 300 MG TABS tablet Take 1 tablet (300 mg total) by mouth daily before breakfast.  . carboxymethylcellulose (REFRESH PLUS) 0.5 % SOLN Place 2 drops into both eyes as needed (Dry eyes).  . fluticasone (FLONASE) 50 MCG/ACT nasal spray PLACE 2 SPRAYS IN EACH NOSTRIL ONCE DAILY  . glucose blood  test strip Use as instructed  . ibuprofen (ADVIL) 600 MG tablet Take 600 mg by mouth every 6 (six) hours as needed.  . Insulin Pen Needle (UNIFINE PENTIPS) 32G X 4 MM MISC USE WITH INSULIN ONCE DAILY  . metFORMIN (GLUCOPHAGE-XR) 500 MG 24 hr tablet Take 2 tablets (1,000 mg total) by mouth 2 (two) times daily.  . Multiple Vitamins-Minerals (CENTRUM SILVER PO) Take 1 tablet by mouth daily.  Marland Kitchen OLANZapine (ZYPREXA) 15 MG tablet Take 15 mg by mouth at bedtime. At 2000  . ondansetron (ZOFRAN-ODT) 8 MG disintegrating tablet TAKE 1 TABLET BY MOUTH EVERY 8 HOURS AS NEEDED NAUSEA AND VOMITING  . OneTouch Delica Lancets 58I MISC Use to check fasting sugar once in AM and once before bed.  .  sertraline (ZOLOFT) 100 MG tablet Take 100 mg by mouth daily.   . [DISCONTINUED] Insulin Glargine (LANTUS) 100 UNIT/ML Solostar Pen Inject 10 Units into the skin at bedtime.   . [DISCONTINUED] simvastatin (ZOCOR) 40 MG tablet TAKE 1 TABLET BY MOUTH ONCE DAILY  . acetaminophen (TYLENOL) 500 MG tablet Take 1,000 mg by mouth every 6 (six) hours as needed. (Patient not taking: Reported on 05/03/2020)  . Azelastine-Fluticasone 137-50 MCG/ACT SUSP USE 1 SPRAY IN EACH NOSTRIL TWICE DAILY (Patient not taking: Reported on 05/03/2020)   No facility-administered medications prior to visit.    Review of Systems  Constitutional: Negative.   Cardiovascular: Negative.   Endocrine: Negative.   Musculoskeletal: Negative.   Neurological: Negative.   Psychiatric/Behavioral: Negative.       Objective    BP 121/69   Pulse 60   Temp 97.8 F (36.6 C)   Resp 16   Ht 5\' 8"  (1.727 m)   Wt 180 lb (81.6 kg)   BMI 27.37 kg/m    Physical Exam Constitutional:      Appearance: Normal appearance.  Neck:     Vascular: No carotid bruit.  Cardiovascular:     Rate and Rhythm: Normal rate and regular rhythm.     Pulses: Normal pulses.     Heart sounds: Normal heart sounds.  Pulmonary:     Effort: Pulmonary effort is normal.     Breath sounds: Normal breath sounds.  Musculoskeletal:     Cervical back: Normal range of motion and neck supple. No tenderness.  Skin:    General: Skin is warm and dry.  Neurological:     Mental Status: He is alert and oriented to person, place, and time. Mental status is at baseline.  Psychiatric:        Mood and Affect: Mood normal.        Behavior: Behavior normal.        Results for orders placed or performed in visit on 05/03/20  POCT glycosylated hemoglobin (Hb A1C)  Result Value Ref Range   Hemoglobin A1C 8.4 (A) 4.0 - 5.6 %   HbA1c POC (<> result, manual entry)     HbA1c, POC (prediabetic range)     HbA1c, POC (controlled diabetic range)    POCT UA -  Microalbumin  Result Value Ref Range   Microalbumin Ur, POC negative mg/L   Creatinine, POC     Albumin/Creatinine Ratio, Urine, POC      Assessment & Plan    1. Type 2 diabetes mellitus with hyperglycemia, without long-term current use of insulin (HCC) Improved but not to goal. Will start on Januvia as below. He does not want to increase insulin. Recheck in 4 months. Request eye exam.  Patient brings up concern about his insulin not being refilled quick enough. Patient requested refill at 2:30pm on a Friday (04/29/20) and is upset that he did not get a response by Saturday morning. Counseled that we have 2 business days to refill prescriptions, and that he should not wait until last min to request refills.  - POCT glycosylated hemoglobin (Hb A1C) - sitaGLIPtin (JANUVIA) 100 MG tablet; Take 1 tablet (100 mg total) by mouth daily.  Dispense: 90 tablet; Refill: 0 - insulin glargine (LANTUS) 100 UNIT/ML Solostar Pen; Inject 10 Units into the skin at bedtime.  Dispense: 9 mL; Refill: 0 - POCT UA - Microalbumin  2. Hyperlipidemia associated with type 2 diabetes mellitus (HCC) Increase Simvastatin from 40 to 80mg  daily. Recheck in 4 months.  - simvastatin (ZOCOR) 80 MG tablet; Take 1 tablet (80 mg total) by mouth daily.  Dispense: 90 tablet; Refill: 0   Return if symptoms worsen or fail to improve.      ITrinna Post, PA-C, have reviewed all documentation for this visit. The documentation on 05/11/20 for the exam, diagnosis, procedures, and orders are all accurate and complete.    Paulene Floor  [facility] [phone] (phone) [phone] (fax)  [place]

## 2020-05-04 NOTE — Telephone Encounter (Signed)
Refilled

## 2020-06-03 LAB — HM DIABETES EYE EXAM

## 2020-06-06 ENCOUNTER — Other Ambulatory Visit: Payer: Self-pay | Admitting: Physician Assistant

## 2020-06-06 MED ORDER — ALBUTEROL SULFATE HFA 108 (90 BASE) MCG/ACT IN AERS: 2.0000 | INHALATION_SPRAY | Freq: Four times a day (QID) | RESPIRATORY_TRACT | 0 refills | Status: DC | PRN

## 2020-06-06 NOTE — Telephone Encounter (Signed)
Requested medication (s) are due for refill today: no  Requested medication (s) are on the active medication list: yes Last refill:  12/10/2019  Future visit scheduled: yes  Notes to clinic:  medication has never been filled by provider  Patient requesting    Requested Prescriptions  Pending Prescriptions Disp Refills   albuterol (VENTOLIN HFA) 108 (90 Base) MCG/ACT inhaler      Sig: Inhale 2 puffs into the lungs every 6 (six) hours as needed for wheezing or shortness of breath.      Pulmonology:  Beta Agonists Failed - 06/06/2020  8:40 AM      Failed - One inhaler should last at least one month. If the patient is requesting refills earlier, contact the patient to check for uncontrolled symptoms.      Passed - Valid encounter within last 12 months    Recent Outpatient Visits           1 month ago Type 2 diabetes mellitus with hyperglycemia, without long-term current use of insulin [facility])   [place], [place], PA-C   4 months ago Type 2 diabetes mellitus with hyperglycemia, without long-term current use of insulin [facility])   [place], [place], PA-C   5 months ago Allergic rhinitis, unspecified seasonality, unspecified trigger   [facility] [place], Wendee Beavers, PA-C   5 months ago No-show for appointment   [place], [place]   6 months ago Generalized abdominal pain   [facility] Trinna Post, [place]       Future Appointments             In 2 months Trinna Post, PA-C [employer], [facility]

## 2020-06-06 NOTE — Telephone Encounter (Signed)
Pt request refill of  albuterol (VENTOLIN HFA) 108 (90 Base) MCG/ACT inhaler  Pt states Fabio Bering has never filled for him. He uses rarely.  But would like this refilled, with I refill on it.  [place], [place]. Phone:  [phone]  Fax:  [phone]

## 2020-06-06 NOTE — Telephone Encounter (Signed)
Please advise? Medication has not been filled by you.

## 2020-06-17 ENCOUNTER — Telehealth: Payer: Self-pay | Admitting: Physician Assistant

## 2020-06-17 NOTE — Telephone Encounter (Signed)
Copied from [place] [id number]. Topic: Medicare AWV >> Jun 17, 2020  2:01 PM Cher Nakai R wrote: Reason for CRM: Left message for patient to call back and schedule Medicare Annual Wellness Visit (AWV) either virtually or in office.  No hx of AWV  Please schedule at anytime with [facility] Advisor.

## 2020-06-29 ENCOUNTER — Telehealth: Payer: Self-pay | Admitting: Physician Assistant

## 2020-06-29 NOTE — Telephone Encounter (Signed)
pls Fu with pt [phone] [id number]. Pt has a terrible tickle in his throat and has not sleep in several nites, Pt states he has taken a Tussin EX product before that knocked his "tickle" right out, however he knows he would need a script for it. Pt states he would be so grateful if someone could call this in for him. Last seen 05/03/20  [place], [place]. Phone:  [phone]  Fax:  [phone]

## 2020-06-30 NOTE — Telephone Encounter (Signed)
Pt returned call, pt has been scheduled.

## 2020-06-30 NOTE — Telephone Encounter (Signed)
Called to schedule patient for virtual appointment, all providers are booked except for Dr. Caryn Section who has a 9:40 am appointment. LVMTCB.

## 2020-06-30 NOTE — Progress Notes (Signed)
Subjective:   Shaun Odonnell is a 54 y.o. male who presents for an Initial Medicare Annual Wellness Visit.  I connected with Valentino Saxon today by telephone and verified that I am speaking with the correct person using two identifiers. Location patient: home Location provider: work Persons participating in the virtual visit: patient, provider.   I discussed the limitations, risks, security and privacy concerns of performing an evaluation and management service by telephone and the availability of in person appointments. I also discussed with the patient that there may be a patient responsible charge related to this service. The patient expressed understanding and verbally consented to this telephonic visit.    Interactive audio and video telecommunications were attempted between this provider and patient, however failed, due to patient having technical difficulties OR patient did not have access to video capability.  We continued and completed visit with audio only.   Review of Systems    N/A  Cardiac Risk Factors include: male gender;smoking/ tobacco exposure;diabetes mellitus;dyslipidemia     Objective:    There were no vitals filed for this visit. There is no height or weight on file to calculate BMI.  Advanced Directives 07/04/2020 12/10/2019 09/02/2019 08/20/2019 08/17/2019 06/30/2019 06/22/2019  Does Patient Have a Medical Advance Directive? No No No No No No No  Does patient want to make changes to medical advance directive? No - Patient declined - - - - - -  Would patient like information on creating a medical advance directive? - No - Patient declined No - Patient declined No - Patient declined No - Patient declined - No - Patient declined    Current Medications (verified) Outpatient Encounter Medications as of 07/04/2020  Medication Sig  . acetaminophen (TYLENOL) 500 MG tablet Take 1,000 mg by mouth every 6 (six) hours as needed.   Marland Kitchen albuterol (VENTOLIN HFA) 108 (90  Base) MCG/ACT inhaler Inhale 2 puffs into the lungs every 6 (six) hours as needed for wheezing or shortness of breath.  . ALPRAZolam (XANAX) 0.5 MG tablet Take 0.5 mg by mouth 4 (four) times daily. Patient takes when wakes up(~0600)/ 1000/ 1600/ 2000  . Azelastine-Fluticasone 137-50 MCG/ACT SUSP Place 1 spray into both nostrils 2 (two) times daily.  . canagliflozin (INVOKANA) 300 MG TABS tablet Take 1 tablet (300 mg total) by mouth daily before breakfast.  . carboxymethylcellulose (REFRESH PLUS) 0.5 % SOLN Place 2 drops into both eyes as needed (Dry eyes).  Marland Kitchen glucose blood test strip Use as instructed  . insulin glargine (LANTUS) 100 UNIT/ML Solostar Pen Inject 10 Units into the skin at bedtime.  . Insulin Pen Needle (UNIFINE PENTIPS) 32G X 4 MM MISC USE WITH INSULIN ONCE DAILY  . metFORMIN (GLUCOPHAGE-XR) 500 MG 24 hr tablet Take 2 tablets (1,000 mg total) by mouth 2 (two) times daily.  . montelukast (SINGULAIR) 10 MG tablet Take 1 tablet (10 mg total) by mouth in the morning.  . Multiple Vitamins-Minerals (CENTRUM SILVER PO) Take 1 tablet by mouth daily.  Marland Kitchen OLANZapine (ZYPREXA) 15 MG tablet Take 15 mg by mouth at bedtime. At 2000  . ondansetron (ZOFRAN-ODT) 8 MG disintegrating tablet TAKE 1 TABLET BY MOUTH EVERY 8 HOURS AS NEEDED NAUSEA AND VOMITING  . OneTouch Delica Lancets 35H MISC Use to check fasting sugar once in AM and once before bed.  . sertraline (ZOLOFT) 100 MG tablet Take 100 mg by mouth daily.   . simvastatin (ZOCOR) 80 MG tablet Take 1 tablet (80 mg total) by mouth daily.  Marland Kitchen  sitaGLIPtin (JANUVIA) 100 MG tablet Take 1 tablet (100 mg total) by mouth daily.  Marland Kitchen ibuprofen (ADVIL) 600 MG tablet Take 600 mg by mouth every 6 (six) hours as needed. (Patient not taking: Reported on 07/01/2020)  . [DISCONTINUED] Azelastine-Fluticasone 137-50 MCG/ACT SUSP USE 1 SPRAY IN EACH NOSTRIL TWICE DAILY (Patient not taking: Reported on 05/03/2020)  . [DISCONTINUED] fluticasone (FLONASE) 50 MCG/ACT nasal  spray PLACE 2 SPRAYS IN EACH NOSTRIL ONCE DAILY (Patient not taking: Reported on 07/01/2020)   No facility-administered encounter medications on file as of 07/04/2020.    Allergies (verified) Fentanyl, Morphine and related, Klonopin [clonazepam], Pimozide, Stelazine [trifluoperazine], Azithromycin, and Tramadol   History: Past Medical History:  Diagnosis Date  . Alcohol abuse 04/29/2019  . Allergy   . Anxiety   . ARDS (adult respiratory distress syndrome) ([place])   . ARDS (adult respiratory distress syndrome) ([place])   . Bipolar disorder ([place])   . Borderline diabetes   . Cataract   . Chronic kidney disease   . COPD (chronic obstructive pulmonary disease) ([facility])    chronic cough and wheezing  . Depression   . Diabetes mellitus without complication ([place])   . Dizziness    medication related  . Dyspnea    with exertion  . Elevated lipids   . Fatty liver   . Hypercholesterolemia   . Hypertension   . Pancreatitis   . Pneumonia    in past  . Pneumonia    in past  . Pre-diabetes    diet controlled  . Pulmonary embolism ([place])   . Schizoaffective disorder [facility])    Past Surgical History:  Procedure Laterality Date  . CATARACT EXTRACTION W/PHACO Right 03/26/2018   Procedure: CATARACT EXTRACTION PHACO AND INTRAOCULAR LENS PLACEMENT ([place]) RIGHT BORDERLINE DIABETIC;  Surgeon: Leandrew Koyanagi, MD;  Location: [place];  Service: Ophthalmology;  Laterality: Right;  . CATARACT EXTRACTION W/PHACO Left 04/23/2018   Procedure: CATARACT EXTRACTION PHACO AND INTRAOCULAR LENS PLACEMENT ([place])  LEFT BORDERLINE DIABETIC;  Surgeon: Leandrew Koyanagi, MD;  Location: [place];  Service: Ophthalmology;  Laterality: Left;  . COLONOSCOPY WITH PROPOFOL N/A 08/21/2017   Procedure: COLONOSCOPY WITH PROPOFOL;  Surgeon: Manya Silvas, MD;  Location: [facility] ENDOSCOPY;  Service: Endoscopy;  Laterality: N/A;  . EYE SURGERY    . HERNIA REPAIR     inguinal and umbilical  . RIB  RESECTION N/A 08/20/2019   Procedure: removal of xyphoid process;  Surgeon: Nestor Lewandowsky, MD;  Location: [facility] ORS;  Service: Thoracic;  Laterality: N/A;  . UMBILICAL HERNIA REPAIR N/A 06/22/2019   Procedure: OPEN HERNIA REPAIR UMBILICAL ADULT;  Surgeon: Olean Ree, MD;  Location: [facility] ORS;  Service: General;  Laterality: N/A;  . UPPER GI ENDOSCOPY     Family History  Problem Relation Age of Onset  . Hyperlipidemia Mother   . Hypertension Father   . Dementia Maternal Grandmother   . Heart disease Maternal Grandfather   . Heart disease Paternal Grandmother   . Stroke Paternal Grandfather   . Diabetes Paternal Grandfather   . Diabetes Maternal Aunt    Social History   Socioeconomic History  . Marital status: Single    Spouse name: Not on file  . Number of children: 0  . Years of education: Not on file  . Highest education level: Bachelor's degree (e.g., BA, AB, BS)  Occupational History  . Occupation: disabled  Tobacco Use  . Smoking status: Current Every Day Smoker    Packs/day: 1.50    Years:  30.00    Pack years: 45.00    Types: Cigarettes  . Smokeless tobacco: Former Systems developer    Types: Snuff  Vaping Use  . Vaping Use: Former  Substance and Sexual Activity  . Alcohol use: Yes    Alcohol/week: 6.0 standard drinks    Types: 6 Cans of beer per week    Comment: 3 beers maybe 2x a week  . Drug use: Not Currently  . Sexual activity: Not Currently  Other Topics Concern  . Not on file  Social History Narrative  . Not on file   Social Determinants of Health   Financial Resource Strain: Low Risk   . Difficulty of Paying Living Expenses: Not hard at all  Food Insecurity: No Food Insecurity  . Worried About Charity fundraiser in the Last Year: Never true  . Ran Out of Food in the Last Year: Never true  Transportation Needs: No Transportation Needs  . Lack of Transportation (Medical): No  . Lack of Transportation (Non-Medical): No  Physical Activity: Inactive  . Days of  Exercise per Week: 0 days  . Minutes of Exercise per Session: 0 min  Stress: No Stress Concern Present  . Feeling of Stress : Not at all  Social Connections: Socially Isolated  . Frequency of Communication with Friends and Family: More than three times a week  . Frequency of Social Gatherings with Friends and Family: Three times a week  . Attends Religious Services: Never  . Active Member of Clubs or Organizations: No  . Attends Archivist Meetings: Never  . Marital Status: Never married    Tobacco Counseling Ready to quit: No Counseling given: No   Clinical Intake:  Pre-visit preparation completed: Yes  Pain : No/denies pain     Nutritional Risks: None Diabetes: Yes  How often do you need to have someone help you when you read instructions, pamphlets, or other written materials from your doctor or pharmacy?: 1 - Never  Diabetic? Yes  Nutrition Risk Assessment:  Has the patient had any N/V/D within the last 2 months?  No  Does the patient have any non-healing wounds?  No  Has the patient had any unintentional weight loss or weight gain?  No   Diabetes:  Is the patient diabetic?  Yes  If diabetic, was a CBG obtained today?  No  Did the patient bring in their glucometer from home?  No  How often do you monitor your CBG's? Twice a day.   Financial Strains and Diabetes Management:  Are you having any financial strains with the device, your supplies or your medication? No .  Does the patient want to be seen by Chronic Care Management for management of their diabetes?  No  Would the patient like to be referred to a Nutritionist or for Diabetic Management?  No   Diabetic Exams:  Diabetic Eye Exam: Completed 06/03/20 Diabetic Foot Exam: Completed 05/03/20  Interpreter Needed?: No  Information entered by :: [facility], LPN   Activities of Daily Living In your present state of health, do you have any difficulty performing the following activities: 07/04/2020  08/17/2019  Hearing? N -  Vision? N -  Comment Wears eye glasses. -  Difficulty concentrating or making decisions? N -  Walking or climbing stairs? N N  Dressing or bathing? N -  Doing errands, shopping? N N  Preparing Food and eating ? N -  Using the Toilet? N -  In the past six months, have you  accidently leaked urine? Y -  Comment Incontinent -  Do you have problems with loss of bowel control? Y -  Comment Incontinent -  Managing your Medications? N -  Managing your Finances? N -  Housekeeping or managing your Housekeeping? N -  Some recent data might be hidden    Patient Care Team: Paulene Floor as PCP - General (Physician Assistant) Leandrew Koyanagi, MD as Referring Physician (Ophthalmology)  Indicate any recent Medical Services you may have received from other than [facility] providers in the past year (date may be approximate).     Assessment:   This is a routine wellness examination for Markel.  Hearing/Vision screen No exam data present  Dietary issues and exercise activities discussed: Current Exercise Habits: The patient does not participate in regular exercise at present, Exercise limited by: None identified  Goals      Patient Stated   .  "I want to know what my blood sugar is supposed to be and learn how to make it better" (pt-stated)      Current Barriers:  . Chronic Disease Management support and education needs related to Type II DM - patient calls today tfor assistance with DM management o Reports fasting cbg = 240 this morning after having bologna/cheese sandwich for supper last night at 5:30pm and "Pure protein" bar for 10pm snack.  o Reports "seeing a halo around my feet when I was propped up watching TV" after supper/before evening snack. Took 4oz apple juice and visual disturbance resolved. No other associated symptom reported. Asks "should I take another basaglar shot if my sugar is low"  Nurse Case Manager Clinical Goal(s):  Marland Kitchen Over the  next 30 days, patient will verbalize understanding of plan for long term self health management of DMII . Over the next 30 days, patient will meet with RN Care Manager to address education and care management/care coordination needs related to DMII . Over the next 14 days, patient will attend all scheduled medical appointments: Carles Collet PA 05/15/19 @ 2:40pm . Over the next 30 days, patient will demonstrate improved health management independence as evidenced byadherence to recommend cbg self monitoring (daily each morning) and recording, improved adherence to recommended carb modified diet, verbalization of understanding of signs and symptoms of hypo and hyperglycemia  Interventions:  . Reviewed diabetes medications and advised to NEVER adjust medications without direct supervision from provider . Advised patient to continue monitoring and recording daily fasting (morning) cbg as advised by PCP . Advised patient to continue food diary and cbg log . Advised on carb modified diet including reduction of simple and processed carbohydrates, addition of non-starchy vegetables, reduction of added fats (likes to add "Velveeta Shells and Cheese" to several other dishes), decrease intake of sugary beverages . Reviewed signs and symptoms of hypoglycemia and hyperglycemia and when to call provider  Patient Self Care Activities:  . Self administers medications as prescribed . Attends all scheduled provider appointments . Monitors and records CBG . Keeps food diary . Calls pharmacy for medication refills . Performs ADL's independently . Performs IADL's independently . Calls provider office for new concerns or questions  Please see past updates related to this goal by clicking on the "Past Updates" button in the selected goal        Other   .  Prevent falls      Recommend to remove any items from the home that may cause slips or trips.      Depression Screen [facility] 2/9  Scores 07/04/2020 12/10/2019  04/29/2019  PHQ - 2 Score 1 3 2   PHQ- 9 Score - 8 3    Fall Risk Fall Risk  07/04/2020 12/10/2019 09/04/2019 08/28/2019 08/06/2019  Falls in the past year? 1 1 0 0 0  Number falls in past yr: 0 0 - 0 -  Injury with Fall? 0 1 - 0 -  Comment - hit head on glass table - loss of consciousness - - -  Follow up Falls prevention discussed - - - -    Any stairs in or around the home? No  If so, are there any without handrails? No  Home free of loose throw rugs in walkways, pet beds, electrical cords, etc? Yes  Adequate lighting in your home to reduce risk of falls? Yes   ASSISTIVE DEVICES UTILIZED TO PREVENT FALLS:  Life alert? No  Use of a cane, walker or w/c? No  Grab bars in the bathroom? No  Shower chair or bench in shower? No  Elevated toilet seat or a handicapped toilet? No    Cognitive Function: Declined today.        Immunizations Immunization History  Administered Date(s) Administered  . Dtap, Unspecified 10/24/2011  . Influenza Split 09/26/2015  . Influenza,inj,Quad PF,6+ Mos 08/06/2019  . Influenza-Unspecified 10/16/2010  . PFIZER SARS-COV-2 Vaccination 01/23/2020, 02/16/2020  . Pneumococcal Polysaccharide-23 10/24/2012    TDAP status: Due, Education has been provided regarding the importance of this vaccine. Advised may receive this vaccine at local pharmacy or Health Dept. Aware to provide a copy of the vaccination record if obtained from local pharmacy or Health Dept. Verbalized acceptance and understanding. Flu Vaccine status: Due fall 2021 Covid-19 vaccine status: Completed vaccines  Qualifies for Shingles Vaccine? Yes   Zostavax completed No   Shingrix Completed?: No.    Education has been provided regarding the importance of this vaccine. Patient has been advised to call insurance company to determine out of pocket expense if they have not yet received this vaccine. Advised may also receive vaccine at local pharmacy or Health Dept. Verbalized acceptance and  understanding.  Screening Tests Health Maintenance  Topic Date Due  . TETANUS/TDAP  Never done  . INFLUENZA VACCINE  06/05/2020  . HEMOGLOBIN A1C  11/02/2020  . FOOT EXAM  05/03/2021  . URINE MICROALBUMIN  05/03/2021  . OPHTHALMOLOGY EXAM  06/03/2021  . COLONOSCOPY  08/22/2027  . PNEUMOCOCCAL POLYSACCHARIDE VACCINE AGE 64-64 HIGH RISK  Completed  . COVID-19 Vaccine  Completed  . HIV Screening  Completed    Health Maintenance  Health Maintenance Due  Topic Date Due  . TETANUS/TDAP  Never done  . INFLUENZA VACCINE  06/05/2020    Colorectal cancer screening: Completed 08/21/17. Repeat every 10 years  Lung Cancer Screening: (Low Dose CT Chest recommended if Age 41-80 years, 30 pack-year currently smoking OR have quit w/in 15years.) does not qualify.    Additional Screening:  Vision Screening: Recommended annual ophthalmology exams for early detection of glaucoma and other disorders of the eye. Is the patient up to date with their annual eye exam?  Yes  Who is the provider or what is the name of the office in which the patient attends annual eye exams? Dr Wallace Going If pt is not established with a provider, would they like to be referred to a provider to establish care? No .   Dental Screening: Recommended annual dental exams for proper oral hygiene  Community Resource Referral / Chronic Care Management: CRR required this visit?  No   CCM required this visit?  No      Plan:     I have personally reviewed and noted the following in the patient's chart:   . Medical and social history . Use of alcohol, tobacco or illicit drugs  . Current medications and supplements . Functional ability and status . Nutritional status . Physical activity . Advanced directives . List of other physicians . Hospitalizations, surgeries, and ER visits in previous 12 months . Vitals . Screenings to include cognitive, depression, and falls . Referrals and appointments  In addition, I  have reviewed and discussed with patient certain preventive protocols, quality metrics, and best practice recommendations. A written personalized care plan for preventive services as well as general preventive health recommendations were provided to patient.     Delene Morais [place], [place]   05/13/6282   Nurse Notes: Pt would like to check with insurance about coverage for a tetanus shot and then will let us know if he wants to get it at next in office apt. Pt would like to receive the flu shot at that apt as well.

## 2020-06-30 NOTE — Telephone Encounter (Signed)
He needs a virtual visit with someone probably.

## 2020-07-01 ENCOUNTER — Ambulatory Visit (INDEPENDENT_AMBULATORY_CARE_PROVIDER_SITE_OTHER): Payer: Medicare Other | Admitting: Family Medicine

## 2020-07-01 ENCOUNTER — Encounter: Payer: Self-pay | Admitting: Family Medicine

## 2020-07-01 DIAGNOSIS — R05 Cough: Secondary | ICD-10-CM | POA: Diagnosis not present

## 2020-07-01 DIAGNOSIS — J309 Allergic rhinitis, unspecified: Secondary | ICD-10-CM

## 2020-07-01 DIAGNOSIS — R059 Cough, unspecified: Secondary | ICD-10-CM

## 2020-07-01 MED ORDER — AZELASTINE-FLUTICASONE 137-50 MCG/ACT NA SUSP: 1.0000 | Freq: Two times a day (BID) | NASAL | 2 refills | Status: DC

## 2020-07-01 MED ORDER — MONTELUKAST SODIUM 10 MG PO TABS: 10.0000 mg | ORAL_TABLET | Freq: Every morning | ORAL | 2 refills | Status: DC

## 2020-07-01 NOTE — Progress Notes (Signed)
Virtual telephone visit    Virtual Visit via Telephone Note   This visit type was conducted due to national recommendations for restrictions regarding the COVID-19 Pandemic (e.g. social distancing) in an effort to limit this patient's exposure and mitigate transmission in our community. Due to his co-morbid illnesses, this patient is at least at moderate risk for complications without adequate follow up. This format is felt to be most appropriate for this patient at this time. The patient did not have access to video technology or had technical difficulties with video requiring transitioning to audio format only (telephone). Physical exam was limited to content and character of the telephone converstion.    Patient location: home Provider location: [facility]  I discussed the limitations of evaluation and management by telemedicine and the availability of in person appointments. The patient expressed understanding and agreed to proceed.   Visit Date: 07/01/2020  Today's healthcare provider: Lelon Huh, MD   Chief Complaint  Patient presents with  . Cough   Subjective    Cough This is a new problem. Episode onset: 2-3 weeks ago. The problem has been unchanged. Associated symptoms include rhinorrhea, a sore throat and wheezing. Pertinent negatives include no chest pain, chills, fever or shortness of breath. The symptoms are aggravated by lying down (cough worse at night). Treatments tried: Mucinex and Delsym. The treatment provided mild relief.    No chest pains, no fevers, chills, sweats or dyspnea. Took allergy nose spray in the spring which worked well.       Medications: Outpatient Medications Prior to Visit  Medication Sig  . acetaminophen (TYLENOL) 500 MG tablet Take 1,000 mg by mouth every 6 (six) hours as needed.   Marland Kitchen albuterol (VENTOLIN HFA) 108 (90 Base) MCG/ACT inhaler Inhale 2 puffs into the lungs every 6 (six) hours as needed for wheezing or shortness of breath.  .  ALPRAZolam (XANAX) 0.5 MG tablet Take 0.5 mg by mouth 4 (four) times daily. Patient takes when wakes up(~0600)/ 1000/ 1600/ 2000  . canagliflozin (INVOKANA) 300 MG TABS tablet Take 1 tablet (300 mg total) by mouth daily before breakfast.  . carboxymethylcellulose (REFRESH PLUS) 0.5 % SOLN Place 2 drops into both eyes as needed (Dry eyes).  Marland Kitchen glucose blood test strip Use as instructed  . insulin glargine (LANTUS) 100 UNIT/ML Solostar Pen Inject 10 Units into the skin at bedtime.  . Insulin Pen Needle (UNIFINE PENTIPS) 32G X 4 MM MISC USE WITH INSULIN ONCE DAILY  . metFORMIN (GLUCOPHAGE-XR) 500 MG 24 hr tablet Take 2 tablets (1,000 mg total) by mouth 2 (two) times daily.  . Multiple Vitamins-Minerals (CENTRUM SILVER PO) Take 1 tablet by mouth daily.  Marland Kitchen OLANZapine (ZYPREXA) 15 MG tablet Take 15 mg by mouth at bedtime. At 2000  . ondansetron (ZOFRAN-ODT) 8 MG disintegrating tablet TAKE 1 TABLET BY MOUTH EVERY 8 HOURS AS NEEDED NAUSEA AND VOMITING  . sertraline (ZOLOFT) 100 MG tablet Take 100 mg by mouth daily.   . simvastatin (ZOCOR) 80 MG tablet Take 1 tablet (80 mg total) by mouth daily.  . sitaGLIPtin (JANUVIA) 100 MG tablet Take 1 tablet (100 mg total) by mouth daily.  . Azelastine-Fluticasone 137-50 MCG/ACT SUSP USE 1 SPRAY IN EACH NOSTRIL TWICE DAILY (Patient not taking: Reported on 05/03/2020)  . fluticasone (FLONASE) 50 MCG/ACT nasal spray PLACE 2 SPRAYS IN EACH NOSTRIL ONCE DAILY (Patient not taking: Reported on 07/01/2020)  . ibuprofen (ADVIL) 600 MG tablet Take 600 mg by mouth every 6 (six) hours  as needed. (Patient not taking: Reported on 07/01/2020)  . OneTouch Delica Lancets 74B MISC Use to check fasting sugar once in AM and once before bed.   No facility-administered medications prior to visit.    Review of Systems  Constitutional: Negative for appetite change, chills and fever.  HENT: Positive for congestion (throat congestion), rhinorrhea and sore throat.   Eyes: Positive for  discharge (watery eyes) and itching.  Respiratory: Positive for cough and wheezing. Negative for chest tightness and shortness of breath.   Cardiovascular: Negative for chest pain and palpitations.  Gastrointestinal: Negative for abdominal pain, nausea and vomiting.      Objective    There were no vitals taken for this visit.   Awake, alert, oriented x 3. In no apparent distress   Assessment & Plan     1. Cough Due to allergies.   2. Allergic rhinitis, unspecified seasonality, unspecified trigger  - Azelastine-Fluticasone 137-50 MCG/ACT SUSP; Place 1 spray into both nostrils 2 (two) times daily.  Dispense: 23 g; Refill: 2 - montelukast (SINGULAIR) 10 MG tablet; Take 1 tablet (10 mg total) by mouth in the morning.  Dispense: 30 tablet; Refill: 2   Cough is worse at night, so he can OTC benadryl at bedtime if needed.     I discussed the assessment and treatment plan with the patient. The patient was provided an opportunity to ask questions and all were answered. The patient agreed with the plan and demonstrated an understanding of the instructions.   The patient was advised to call back or seek an in-person evaluation if the symptoms worsen or if the condition fails to improve as anticipated.  I provided 12 minutes of non-face-to-face time during this encounter.  The entirety of the information documented in the History of Present Illness, Review of Systems and Physical Exam were personally obtained by me. Portions of this information were initially documented by the CMA and reviewed by me for thoroughness and accuracy.     Lelon Huh, MD [facility] [phone] (phone) [phone] (fax)  [place]

## 2020-07-04 ENCOUNTER — Ambulatory Visit: Payer: Medicare Other

## 2020-07-04 ENCOUNTER — Ambulatory Visit (INDEPENDENT_AMBULATORY_CARE_PROVIDER_SITE_OTHER): Payer: Medicare Other

## 2020-07-04 ENCOUNTER — Other Ambulatory Visit: Payer: Self-pay

## 2020-07-04 DIAGNOSIS — Z0000 Encounter for general adult medical examination without abnormal findings: Secondary | ICD-10-CM

## 2020-07-04 NOTE — Patient Instructions (Signed)
Shaun Odonnell , Thank you for taking time to come for your Medicare Wellness Visit. I appreciate your ongoing commitment to your health goals. Please review the following plan we discussed and let me know if I can assist you in the future.   Screening recommendations/referrals: Colonoscopy: Up to date, due 08/2027 Recommended yearly ophthalmology/optometry visit for glaucoma screening and checkup Recommended yearly dental visit for hygiene and checkup  Vaccinations: Influenza vaccine: Due fall 2021 Tdap vaccine: Currently due, declined at this time.  Shingles vaccine: Shingrix discussed. Please contact your pharmacy for coverage information.     Advanced directives: Advance directive discussed with you today. Even though you declined this today please call our office should you change your mind and we can give you the proper paperwork for you to fill out.  Conditions/risks identified: Fall risk preventatives discussed today.   Next appointment: 09/02/20 @ 1:20 PM with [place], Male Preventive care refers to lifestyle choices and visits with your health care provider that can promote health and wellness. What does preventive care include?  A yearly physical exam. This is also called an annual well check.  Dental exams once or twice a year.  Routine eye exams. Ask your health care provider how often you should have your eyes checked.  Personal lifestyle choices, including:  Daily care of your teeth and gums.  Regular physical activity.  Eating a healthy diet.  Avoiding tobacco and drug use.  Limiting alcohol use.  Practicing safe sex.  Taking low-dose aspirin every day starting at age 60. What happens during an annual well check? The services and screenings done by your health care provider during your annual well check will depend on your age, overall health, lifestyle risk factors, and family history of disease. Counseling  Your health  care provider may ask you questions about your:  Alcohol use.  Tobacco use.  Drug use.  Emotional well-being.  Home and relationship well-being.  Sexual activity.  Eating habits.  Work and work Statistician. Screening  You may have the following tests or measurements:  Height, weight, and BMI.  Blood pressure.  Lipid and cholesterol levels. These may be checked every 5 years, or more frequently if you are over 72 years old.  Skin check.  Lung cancer screening. You may have this screening every year starting at age 50 if you have a 30-pack-year history of smoking and currently smoke or have quit within the past 15 years.  Fecal occult blood test (FOBT) of the stool. You may have this test every year starting at age 57.  Flexible sigmoidoscopy or colonoscopy. You may have a sigmoidoscopy every 5 years or a colonoscopy every 10 years starting at age 24.  Prostate cancer screening. Recommendations will vary depending on your family history and other risks.  Hepatitis C blood test.  Hepatitis B blood test.  Sexually transmitted disease (STD) testing.  Diabetes screening. This is done by checking your blood sugar (glucose) after you have not eaten for a while (fasting). You may have this done every 1-3 years. Discuss your test results, treatment options, and if necessary, the need for more tests with your health care provider. Vaccines  Your health care provider may recommend certain vaccines, such as:  Influenza vaccine. This is recommended every year.  Tetanus, diphtheria, and acellular pertussis (Tdap, Td) vaccine. You may need a Td booster every 10 years.  Zoster vaccine. You may need this after age 4.  Pneumococcal 13-valent conjugate (  PCV13) vaccine. You may need this if you have certain conditions and have not been vaccinated.  Pneumococcal polysaccharide (PPSV23) vaccine. You may need one or two doses if you smoke cigarettes or if you have certain  conditions. Talk to your health care provider about which screenings and vaccines you need and how often you need them. This information is not intended to replace advice given to you by your health care provider. Make sure you discuss any questions you have with your health care provider. Document Released: 11/18/2015 Document Revised: 07/11/2016 Document Reviewed: 08/23/2015 Elsevier Interactive Patient Education  2017 [place] Prevention in the Home Falls can cause injuries. They can happen to people of all ages. There are many things you can do to make your home safe and to help prevent falls. What can I do on the outside of my home?  Regularly fix the edges of walkways and driveways and fix any cracks.  Remove anything that might make you trip as you walk through a door, such as a raised step or threshold.  Trim any bushes or trees on the path to your home.  Use bright outdoor lighting.  Clear any walking paths of anything that might make someone trip, such as rocks or tools.  Regularly check to see if handrails are loose or broken. Make sure that both sides of any steps have handrails.  Any raised decks and porches should have guardrails on the edges.  Have any leaves, snow, or ice cleared regularly.  Use sand or salt on walking paths during winter.  Clean up any spills in your garage right away. This includes oil or grease spills. What can I do in the bathroom?  Use night lights.  Install grab bars by the toilet and in the tub and shower. Do not use towel bars as grab bars.  Use non-skid mats or decals in the tub or shower.  If you need to sit down in the shower, use a plastic, non-slip stool.  Keep the floor dry. Clean up any water that spills on the floor as soon as it happens.  Remove soap buildup in the tub or shower regularly.  Attach bath mats securely with double-sided non-slip rug tape.  Do not have throw rugs and other things on the floor that  can make you trip. What can I do in the bedroom?  Use night lights.  Make sure that you have a light by your bed that is easy to reach.  Do not use any sheets or blankets that are too big for your bed. They should not hang down onto the floor.  Have a firm chair that has side arms. You can use this for support while you get dressed.  Do not have throw rugs and other things on the floor that can make you trip. What can I do in the kitchen?  Clean up any spills right away.  Avoid walking on wet floors.  Keep items that you use a lot in easy-to-reach places.  If you need to reach something above you, use a strong step stool that has a grab bar.  Keep electrical cords out of the way.  Do not use floor polish or wax that makes floors slippery. If you must use wax, use non-skid floor wax.  Do not have throw rugs and other things on the floor that can make you trip. What can I do with my stairs?  Do not leave any items on the stairs.  Make  sure that there are handrails on both sides of the stairs and use them. Fix handrails that are broken or loose. Make sure that handrails are as long as the stairways.  Check any carpeting to make sure that it is firmly attached to the stairs. Fix any carpet that is loose or worn.  Avoid having throw rugs at the top or bottom of the stairs. If you do have throw rugs, attach them to the floor with carpet tape.  Make sure that you have a light switch at the top of the stairs and the bottom of the stairs. If you do not have them, ask someone to add them for you. What else can I do to help prevent falls?  Wear shoes that:  Do not have high heels.  Have rubber bottoms.  Are comfortable and fit you well.  Are closed at the toe. Do not wear sandals.  If you use a stepladder:  Make sure that it is fully opened. Do not climb a closed stepladder.  Make sure that both sides of the stepladder are locked into place.  Ask someone to hold it for  you, if possible.  Clearly mark and make sure that you can see:  Any grab bars or handrails.  First and last steps.  Where the edge of each step is.  Use tools that help you move around (mobility aids) if they are needed. These include:  Canes.  Walkers.  Scooters.  Crutches.  Turn on the lights when you go into a dark area. Replace any light bulbs as soon as they burn out.  Set up your furniture so you have a clear path. Avoid moving your furniture around.  If any of your floors are uneven, fix them.  If there are any pets around you, be aware of where they are.  Review your medicines with your doctor. Some medicines can make you feel dizzy. This can increase your chance of falling. Ask your doctor what other things that you can do to help prevent falls. This information is not intended to replace advice given to you by your health care provider. Make sure you discuss any questions you have with your health care provider. Document Released: 08/18/2009 Document Revised: 03/29/2016 Document Reviewed: 11/26/2014 Elsevier Interactive Patient Education  2017 [employer].

## 2020-07-05 ENCOUNTER — Telehealth: Payer: Self-pay

## 2020-07-05 ENCOUNTER — Ambulatory Visit: Payer: Self-pay

## 2020-07-05 DIAGNOSIS — R059 Cough, unspecified: Secondary | ICD-10-CM

## 2020-07-05 MED ORDER — BENZONATATE 100 MG PO CAPS: 100.0000 mg | ORAL_CAPSULE | Freq: Two times a day (BID) | ORAL | 0 refills | Status: DC | PRN

## 2020-07-05 NOTE — Telephone Encounter (Signed)
Patient called and says he saw Dr. Caryn Section on 07/01/20 for the cough he has and Dr. Caryn Section prescribed Singulair. He says after taking 3 doses, he started having side effects-headache, nausea, feeling a little anxious, panic, and other psychologic symptoms that he says he read about when he looked up the side effects. He says he stopped taking the Singulair. He says he's been taking Delsym for the cough, but it's not controlling the cough. He is asking for something stronger for the cough. I advised I will send this note to Dr. Caryn Section, since he did see him on 07/01/20 for his issue, and someone will call back with his recommendation.  Reason for Disposition . Prescription request for new medicine (not a refill)  Answer Assessment - Initial Assessment Questions 1. NAME of MEDICATION: "What medicine are you calling about?"     Singulair 2. QUESTION: "What is your question?" (e.g., medication refill, side effect)     I've been having headaches, nausea and more side effects-anxiety, panic a little bit  3. PRESCRIBING HCP: "Who prescribed it?" Reason: if prescribed by specialist, call should be referred to that group.     Dr. Caryn Section 4. SYMPTOMS: "Do you have any symptoms?"     Yes 5. SEVERITY: If symptoms are present, ask "Are they mild, moderate or severe?"     Pretty bad, I stopped taking the medication after 3 doses 6. PREGNANCY:  "Is there any chance that you are pregnant?" "When was your last menstrual period?"     N/A  Protocols used: MEDICATION QUESTION CALL-A-AH

## 2020-07-05 NOTE — Telephone Encounter (Signed)
Left message to call back. He will need an appt to discuss symptoms and side effects of the medication. OK for [facility] to schedule virtual appt.

## 2020-07-05 NOTE — Telephone Encounter (Signed)
Have sent prescription for tessalon

## 2020-07-05 NOTE — Telephone Encounter (Signed)
Copied from [place] [id number]. Topic: General - Other >> Jul 05, 2020  9:15 AM Leward Quan A wrote: Reason for CRM: Patient called to ask if Carles Collet can please send an Rx to his Pharmacy for cough medication because he is coughing excessively and is up most of the night with this cough. Was taking OTC cough syrup but its not helping anymore need something stronger than Delysum. States that he is experiencing side effects of the  montelukast (SINGULAIR) 10 MG tablet and has stopped taking it due to these side effects. Patient would like a call back please from a nurse Ph# [phone]

## 2020-07-05 NOTE — Addendum Note (Signed)
Addended by: Birdie Sons on: 07/05/2020 04:58 PM   Modules accepted: Orders

## 2020-07-05 NOTE — Telephone Encounter (Addendum)
Patient called back and did not want the virtual appointment. Patient was triaged (see nurse triage call encounter).

## 2020-07-06 ENCOUNTER — Other Ambulatory Visit: Payer: Self-pay | Admitting: Physician Assistant

## 2020-07-06 DIAGNOSIS — E1165 Type 2 diabetes mellitus with hyperglycemia: Secondary | ICD-10-CM

## 2020-07-06 NOTE — Telephone Encounter (Signed)
Tried calling patient. Left message to call back. OK for [facility] triage to advise as below.

## 2020-07-06 NOTE — Telephone Encounter (Signed)
Reviewed prescription Tessalon with the patient along with care advice including increase daily water intake to minimum 5 glasses.

## 2020-07-14 ENCOUNTER — Telehealth: Payer: Self-pay

## 2020-07-14 DIAGNOSIS — R059 Cough, unspecified: Secondary | ICD-10-CM

## 2020-07-14 MED ORDER — PROMETHAZINE-DM 6.25-15 MG/5ML PO SYRP: 5.0000 mL | ORAL_SOLUTION | Freq: Every evening | ORAL | 0 refills | Status: DC | PRN

## 2020-07-14 NOTE — Telephone Encounter (Signed)
Reviewed Adriana's note with the patient. He has no memory of taking phenergan in the past but is concerned about taking the Phrnergan DM due to the cross reaction mentioned in provider's note. He reported prescription strength Tussin has worked for him during a COPD exacerbation in the past-would the provider consider calling in [place] to his pharmacy, [place]. If not, he would be willing to try the Phenergan DM. He denies any SOB/chest tightness/wheezing/fever. He also is requesting a callback by nurse to inform him of the provider's decision on the medication.  Routing to the office for further consideration.

## 2020-07-14 NOTE — Telephone Encounter (Signed)
I give phenergan DM as cough syrup, which he has a cross reaction to in his allergies. If he has tolerated phenergan previously I will send it in. If he hasn't, there is no other cough medication I would send in. He should be re-evaluated if he is worsening.

## 2020-07-14 NOTE — Telephone Encounter (Signed)
Copied from [place] [id number]. Topic: General - Other >> Jul 14, 2020  9:56 AM Hinda Lenis D wrote: Medication not working / please advise / benzonatate (TESSALON) 100 MG capsule [[id number]]

## 2020-07-14 NOTE — Telephone Encounter (Signed)
Reaction is a side effect, not an allergic reaction and this is a smaller dose.

## 2020-07-14 NOTE — Addendum Note (Signed)
Addended by: Trinna Post on: 07/14/2020 03:21 PM   Modules accepted: Orders

## 2020-07-14 NOTE — Telephone Encounter (Signed)
Left message to call back. OK for [facility] to as pt as below.

## 2020-07-14 NOTE — Telephone Encounter (Signed)
Please advise. Thanks.  

## 2020-07-14 NOTE — Telephone Encounter (Signed)
Spoke with patient on phone he states for the past 8 days he has been taking Tessalon to help control cough. Patient describes cough as hacking and states that it has been gagging him and causing him to vomit. Patient states that he has been doing a regimen of tessalon, Delsym and Mucinex with no improvement. Patient reports cough is causing him difficulty sleeping through night, patient is requesting a prescription cough syrup be called into [facility]. KW

## 2020-07-14 NOTE — Telephone Encounter (Signed)
Advised patient as below.  

## 2020-07-24 ENCOUNTER — Other Ambulatory Visit: Payer: Self-pay | Admitting: Physician Assistant

## 2020-07-24 DIAGNOSIS — E1165 Type 2 diabetes mellitus with hyperglycemia: Secondary | ICD-10-CM

## 2020-07-24 NOTE — Telephone Encounter (Signed)
Requested Prescriptions  Pending Prescriptions Disp Refills   metFORMIN (GLUCOPHAGE-XR) 500 MG 24 hr tablet [Pharmacy Med Name: METFORMIN HCL ER 500 MG TAB] 360 tablet 0    Sig: TAKE 2 TABLETS BY MOUTH TWICE DAILY     Endocrinology:  Diabetes - Biguanides Failed - 07/24/2020  1:07 PM      Failed - Cr in normal range and within 360 days    Creatinine  Date Value Ref Range Status  11/26/2014 0.79 0.60 - 1.30 mg/dL Final   Creatinine, Ser  Date Value Ref Range Status  01/19/2020 0.67 (L) 0.76 - 1.27 mg/dL Final         Failed - HBA1C is between 0 and 7.9 and within 180 days    Hemoglobin A1C  Date Value Ref Range Status  05/03/2020 8.4 (A) 4.0 - 5.6 % Final  08/03/2014 5.7 4.2 - 6.3 % Final    Comment:    The American Diabetes Association recommends that a primary goal of therapy should be <7% and that physicians should reevaluate the treatment regimen in patients with HbA1c values consistently >8%.    Hgb A1c MFr Bld  Date Value Ref Range Status  01/19/2020 9.0 (H) 4.8 - 5.6 % Final    Comment:             Prediabetes: 5.7 - 6.4          Diabetes: >6.4          Glycemic control for adults with diabetes: <7.0          Passed - eGFR in normal range and within 360 days    EGFR (African American)  Date Value Ref Range Status  11/26/2014 >60 >41m/min Final  06/15/2014 >60  Final   GFR calc Af Amer  Date Value Ref Range Status  01/19/2020 129 >59 mL/min/1.73 Final   EGFR (Non-African Amer.)  Date Value Ref Range Status  11/26/2014 >60 >642mmin Final    Comment:    eGFR values <6079min/1.73 m2 may be an indication of chronic kidney disease (CKD). Calculated eGFR, using the MRDR Study equation, is useful in  patients with stable renal function. The eGFR calculation will not be reliable in acutely ill patients when serum creatinine is changing rapidly. It is not useful in patients on dialysis. The eGFR calculation may not be applicable to patients at the low and  high extremes of body sizes, pregnant women, and vegetarians.   06/15/2014 >60  Final    Comment:    eGFR values <63m84mn/1.73 m2 may be an indication of chronic kidney disease (CKD). Calculated eGFR is useful in patients with stable renal function. The eGFR calculation will not be reliable in acutely ill patients when serum creatinine is changing rapidly. It is not useful in  patients on dialysis. The eGFR calculation may not be applicable to patients at the low and high extremes of body sizes, pregnant women, and vegetarians.    GFR calc non Af Amer  Date Value Ref Range Status  01/19/2020 111 >59 mL/min/1.73 Final         Passed - Valid encounter within last 6 months    Recent Outpatient Visits          3 weeks ago [facility]   2 months ago Type 2 diabetes mellitus with hyperglycemia, without long-term current use of insulin [facility] Type 2  diabetes mellitus with hyperglycemia, without long-term current use of insulin [facility])   [place], [place], [place]   7 months ago Allergic rhinitis, unspecified seasonality, unspecified trigger   [facility] Carles Collet M, PA-C   7 months ago No-show for appointment   [facility] Trinna Post, [place]      Future Appointments            In 1 month Trinna Post, PA-C [employer], [place]

## 2020-07-25 ENCOUNTER — Other Ambulatory Visit: Payer: Self-pay | Admitting: Physician Assistant

## 2020-07-25 DIAGNOSIS — E1165 Type 2 diabetes mellitus with hyperglycemia: Secondary | ICD-10-CM

## 2020-07-25 MED ORDER — SITAGLIPTIN PHOSPHATE 100 MG PO TABS: 100.0000 mg | ORAL_TABLET | Freq: Every day | ORAL | 0 refills | Status: DC

## 2020-07-25 NOTE — Telephone Encounter (Signed)
Medication: sitaGLIPtin (JANUVIA) 100 MG tablet [[id number]]   Has the patient contacted their pharmacy? YES (Agent: If no, request that the patient contact the pharmacy for the refill.) (Agent: If yes, when and what did the pharmacy advise?)  Preferred Pharmacy (with phone number or street name): [facility] [place], [place] [address] Phone: [phone] Fax: [phone] Hours: Not open 24 hours    Agent: Please be advised that RX refills may take up to 3 business days. We ask that you follow-up with your pharmacy.

## 2020-07-27 ENCOUNTER — Telehealth: Payer: Self-pay

## 2020-07-27 NOTE — Telephone Encounter (Signed)
Pt reports that he needs refills on Metformin. I advised that he should have refills at the pharmacy as a 3 month supply was sent on 07/24/20. Pt reports that he is almost out of medication. Tried calling pharmacy to clarify and kept getting busy signal. Will try again later to clarify.

## 2020-07-29 ENCOUNTER — Other Ambulatory Visit: Payer: Self-pay | Admitting: Physician Assistant

## 2020-07-29 DIAGNOSIS — E1165 Type 2 diabetes mellitus with hyperglycemia: Secondary | ICD-10-CM

## 2020-07-29 NOTE — Telephone Encounter (Signed)
Called pharmacy and gave a verbal order for refill on Metformin. Called patient and no answer and left detailed voicemail per DPR informing patient that another refill was sent to pharmacy and he can go pick up his medication.

## 2020-08-01 ENCOUNTER — Telehealth: Payer: Self-pay

## 2020-08-01 NOTE — Telephone Encounter (Signed)
Copied from [place] [id number]. Topic: General - Inquiry >> Jul 29, 2020  3:00 PM Shaun Odonnell wrote: Pt called saying the nasal spray that he has been using that was prescribed by Fabio Bering was working but now seems to not be working as well.  He wants to know if there is something else he can try.  He says he also has a dry cough that is not getting better.  Seasonal allergies is what he said he has.  [facility]  CB#  [phone]

## 2020-08-02 ENCOUNTER — Ambulatory Visit: Payer: Self-pay

## 2020-08-02 NOTE — Telephone Encounter (Signed)
Please see telephone note

## 2020-08-02 NOTE — Telephone Encounter (Signed)
Called pt back and gave him message from [facility] PA.  Pt stated that he feels like a "lab rat" and "I can't go on like this."   Pt stated that he is not going to try the Nasacort.  Pt stated that Tussionex "has always helped in the past." "I don't understand why they wont just prescribe a medicine I know that helps?"  Pt stated that he is  having coughing episodes where he coughs so hard that he vomits. Pt stated that his cough wakes him every night. Pt stated he has been taking Mucinex DM and Delsym for 1 month. He stated the Delsym works better than the Mucinex DM.  Pt stated he is frustrated from not getting any relief from anything other than Tussionex.  Pt stated that he does wheeze when he goes to bed but the Albuterol helps that.  He is coughing up white to clear phlegm alternating with a dry cough.  Pt would like a phone call to discuss this.   Advised pt that DM is not good to take if having productive cough. Advised pt to get humidifier and to drink warm fluids to help relax his airways.   Pt stated Tessalon Perels were ineffective and the Promethazine DM made his cough worse and he threw it away.   Reason for Disposition  Cough lasts > 3 weeks  Answer Assessment - Initial Assessment Questions 1. ONSET: "When did the cough begin?"      1 month 2. SEVERITY: "How bad is the cough today?"      Occasionally causes him to vomit and wakes him up at night 3. SPUTUM: "Describe the color of your sputum" (none, dry cough; clear, white, yellow, green)     White, clear 4. HEMOPTYSIS: "Are you coughing up any blood?" If so ask: "How much?" (flecks, streaks, tablespoons, etc.)     no 5. DIFFICULTY BREATHING: "Are you having difficulty breathing?" If Yes, ask: "How bad is it?" (e.g., mild, moderate, severe)    - MILD: No SOB at rest, mild SOB with walking, speaks normally in sentences, can lay down, no retractions, pulse < 100.    - MODERATE: SOB at rest, SOB with minimal exertion  and prefers to sit, cannot lie down flat, speaks in phrases, mild retractions, audible wheezing, pulse 100-120.    - SEVERE: Very SOB at rest, speaks in single words, struggling to breathe, sitting hunched forward, retractions, pulse > 120      no 6. FEVER: "Do you have a fever?" If Yes, ask: "What is your temperature, how was it measured, and when did it start?"     no  8. LUNG HISTORY: "Do you have any history of lung disease?"  (e.g., pulmonary embolus, asthma, emphysema)     COPD  10. OTHER SYMPTOMS: "Do you have any other symptoms?" (e.g., runny nose, wheezing, chest pain)       Wheezing at bedtime 12. TRAVEL: "Have you traveled out of the country in the last month?" (e.g., travel history, exposures)  no  Protocols used: COUGH - CHRONIC-A-AH

## 2020-08-02 NOTE — Telephone Encounter (Signed)
He can take nasacort. He also has COPD based on CT chest from 2017 which is likely contributing to cough. Can discuss chronic management of this at his follow up.

## 2020-08-02 NOTE — Telephone Encounter (Signed)
Left message for patient to call office back okay for [facility] triage to advise as below. KW

## 2020-08-02 NOTE — Telephone Encounter (Signed)
Called pt back and gave him message from [facility] PA.  Pt stated that he feels like a "lab rat" and "I can't go on like this."   Pt stated that he is not going to try the Nasacort.  Pt stated that Tussionex "has always helped in the past." "I don't understand why they wont just prescribe a medicine I know that helps?"  Pt stated that he is  having coughing episodes where he coughs so hard that he vomits. Pt stated that his cough wakes him every night. Pt stated he has been taking Mucinex DM and Delsym for 1 month. He stated the Delsym works better than the Mucinex DM.  Pt stated he is frustrated from not getting any relief from anything other than Tussionex.  Pt stated that he does wheeze when he goes to bed but the Albuterol helps that.  He is coughing up white to clear phlegm alternating with a dry cough.  Pt would like a phone call to discuss this.   Advised pt that DM is not good to take if having productive cough. Advised pt to get humidifier and to drink warm fluids to help relax his airways.   Pt stated Tessalon Perels were ineffective and the Promethazine DM made his cough worse and he threw it away.

## 2020-08-03 NOTE — Telephone Encounter (Signed)
Advised pt as below that tussinex is not recommended for a chronic cough. He reports that he is not going to argue anymore about what works for him and what doesn't. He reports that if Fabio Bering does not call this in, he will have to go to the ER and get it filled. He reports that he needs something "fast acting" for his cough, and he does not want to wait days for something to work. He does not want to try any other recommendations from [facility] or Dr. Caryn Section. He feels like a "lab rat" with all these trials of medications. Just FYI. Thanks!

## 2020-08-03 NOTE — Telephone Encounter (Signed)
Tussionex is not appropriate for a chronic cough. It is certainly his choice if he would like to take my recommendations or not. My assessment from before still stands.

## 2020-08-03 NOTE — Telephone Encounter (Signed)
Pt has called back again and has basically stated everything in notes of yesterday, states that montelukast (SINGULAIR) 10 MG tablet has been removed from the RX list at pharmacy by his request.  Will not be taking anything else adverse to him, is going to take Nyquil. Wants a cb from nurse and that his chart not being read as was allergic to Singulair,  that Tussionex does work and to make sure I include that someone needs to step up to the plate, Again that being documented,  he want a cb to [phone]

## 2020-08-09 ENCOUNTER — Other Ambulatory Visit: Payer: Self-pay | Admitting: Family Medicine

## 2020-08-09 DIAGNOSIS — E1165 Type 2 diabetes mellitus with hyperglycemia: Secondary | ICD-10-CM

## 2020-08-11 ENCOUNTER — Other Ambulatory Visit: Payer: Self-pay | Admitting: Physician Assistant

## 2020-08-11 ENCOUNTER — Other Ambulatory Visit: Payer: Self-pay | Admitting: Family Medicine

## 2020-08-11 DIAGNOSIS — E1165 Type 2 diabetes mellitus with hyperglycemia: Secondary | ICD-10-CM

## 2020-08-11 DIAGNOSIS — E1169 Type 2 diabetes mellitus with other specified complication: Secondary | ICD-10-CM

## 2020-08-12 ENCOUNTER — Other Ambulatory Visit: Payer: Self-pay | Admitting: Physician Assistant

## 2020-08-12 DIAGNOSIS — E785 Hyperlipidemia, unspecified: Secondary | ICD-10-CM

## 2020-08-12 DIAGNOSIS — E1165 Type 2 diabetes mellitus with hyperglycemia: Secondary | ICD-10-CM

## 2020-08-12 DIAGNOSIS — E1169 Type 2 diabetes mellitus with other specified complication: Secondary | ICD-10-CM

## 2020-08-12 MED ORDER — UNIFINE PENTIPS 32G X 4 MM MISC: 1 refills | Status: DC

## 2020-08-12 NOTE — Telephone Encounter (Signed)
PT need a refill  Insulin Pen Needle (UNIFINE PENTIPS) 32G X 4 MM MISC [[id number]]  INVOKANA 300 MG TABS tablet [[id number]]  simvastatin (ZOCOR) 80 MG tablet [[id number]]  [place] [place] [address] [place] [address]  Phone: [phone] Fax: [phone]

## 2020-08-18 ENCOUNTER — Other Ambulatory Visit: Payer: Self-pay

## 2020-08-18 ENCOUNTER — Telehealth: Payer: Self-pay

## 2020-08-18 DIAGNOSIS — J309 Allergic rhinitis, unspecified: Secondary | ICD-10-CM

## 2020-08-18 MED ORDER — AZELASTINE-FLUTICASONE 137-50 MCG/ACT NA SUSP: 1.0000 | Freq: Two times a day (BID) | NASAL | 2 refills | Status: DC

## 2020-08-18 NOTE — Telephone Encounter (Signed)
Advised pt as below.  

## 2020-08-18 NOTE — Telephone Encounter (Signed)
Copied from [place] [phone]. Topic: General - Other >> Aug 18, 2020 10:57 AM Rainey Pines A wrote: Patient is requesting a callback from nurse because he feels that his nasal spray medication has been sent to the pharmacy incorrectly twice in regards to the directions. Patient stated that he is using the spray more often now due to allergies and is using medication 2 times per nostril twice a day. Please advise >> Aug 18, 2020 11:59 AM Keene Breath wrote: Patient called and was read the message from the doctor.  Patient stated the medication that was prescribed is not correct.  Please call patient to discuss.

## 2020-08-18 NOTE — Telephone Encounter (Signed)
I have sent them in with the appropriate instructions. This is the recommended use of this medication and we have had in depth conversations prior about not using it more than prescribed. He may be taking it that way but I will not be prescribing it that way.

## 2020-08-18 NOTE — Telephone Encounter (Signed)
Pt has another message in regards to this. See other message.

## 2020-08-18 NOTE — Telephone Encounter (Signed)
Copied from [place] [phone]. Topic: General - Other >> Aug 18, 2020 10:57 AM Rainey Pines A wrote: Patient is requesting a callback from nurse because he feels that his nasal spray medication has been sent to the pharmacy incorrectly twice in regards to the directions. Patient stated that he is using the spray more often now due to allergies and is using medication 2 times per nostril twice a day. Please advise

## 2020-08-18 NOTE — Telephone Encounter (Signed)
Patient called the after hours line requesting refills. He reports that he is having to use it more often due to allergies/cough. Please review. Thanks !

## 2020-08-30 ENCOUNTER — Telehealth: Payer: Self-pay

## 2020-08-30 NOTE — Telephone Encounter (Signed)
Labs will be ordered at the time of office visit.

## 2020-08-30 NOTE — Telephone Encounter (Signed)
Copied from [place] [phone]. Topic: General - Other >> Aug 30, 2020  2:07 PM Keene Breath wrote: Reason for CRM: Patient would like to go and get his labs done before his appt.  On Friday.  Please place orders in so that patient can get the lab work done tomorrow because he said that will be the only day he has free before his appt.  Please call patient to confirm at [phone]  I can place the labs but it looks like is a DM f/u which mean maybe just an A1C?

## 2020-08-30 NOTE — Telephone Encounter (Signed)
Please review. Thanks!  

## 2020-08-31 NOTE — Telephone Encounter (Signed)
Called patient and no answer left voicemail message for patient.

## 2020-09-01 ENCOUNTER — Telehealth: Payer: Self-pay

## 2020-09-01 NOTE — Telephone Encounter (Signed)
Copied from [place] [phone]. Topic: General - Other >> Aug 30, 2020  2:07 PM Keene Breath wrote: Reason for CRM: Patient would like to go and get his labs done before his appt.  On Friday.  Please place orders in so that patient can get the lab work done tomorrow because he said that will be the only day he has free before his appt.  Please call patient to confirm at [phone] >> Aug 31, 2020  5:02 PM Leonides Schanz, [place] wrote: Pt stated he would like to be fasting for the labs so he prefers to have the labs ordered before his appt.Pt stated his appt is at 1pm so he would have ate breakfast and lunch by the time he comes in for the appt.

## 2020-09-01 NOTE — Telephone Encounter (Signed)
It's OK I will take them nonfasting at the appointment.

## 2020-09-01 NOTE — Telephone Encounter (Signed)
Spoke to patient and he would like for his labs to be fasting. He states that he is not going to have his blood work done if labs are not going to be fasting. Just a [place]

## 2020-09-01 NOTE — Telephone Encounter (Signed)
Patient was advise and states that he will just come back after his appointment tomorrow to have fasting labs.

## 2020-09-02 ENCOUNTER — Telehealth: Payer: Self-pay

## 2020-09-02 ENCOUNTER — Ambulatory Visit: Payer: Self-pay | Admitting: Physician Assistant

## 2020-09-02 ENCOUNTER — Telehealth: Payer: Self-pay | Admitting: Physician Assistant

## 2020-09-02 NOTE — Telephone Encounter (Signed)
No Show warning letter will be printed and sent.

## 2020-09-02 NOTE — Telephone Encounter (Signed)
Pt made another appt pt will see adriana on 09-09-2020. Pt would like blood work prior to his appt. Please advise

## 2020-09-02 NOTE — Telephone Encounter (Signed)
Copied from [place] [phone]. Topic: Quick Communication - Appointment Cancellation >> Sep 02, 2020 11:08 AM Lennox Solders wrote: Patient called to cancel appointment scheduled for today with adriana . Patient HAS  rescheduled their appointment. Pt cancelled due to not feeling well  Route to department's PEC pool.

## 2020-09-05 NOTE — Telephone Encounter (Signed)
My answer is the same as before, see prior phone notes.

## 2020-09-07 NOTE — Telephone Encounter (Signed)
Patient came in the office today,09/07/2020 and was advised that labs would be drawn if need any at appointment.

## 2020-09-09 ENCOUNTER — Ambulatory Visit: Payer: Medicare Other | Admitting: Physician Assistant

## 2020-09-12 ENCOUNTER — Other Ambulatory Visit: Payer: Self-pay

## 2020-09-12 ENCOUNTER — Encounter: Payer: Self-pay | Admitting: Physician Assistant

## 2020-09-12 ENCOUNTER — Ambulatory Visit (INDEPENDENT_AMBULATORY_CARE_PROVIDER_SITE_OTHER): Payer: Medicare Other | Admitting: Physician Assistant

## 2020-09-12 VITALS — BP 124/68 | HR 60 | Temp 98.2°F | Wt 183.2 lb

## 2020-09-12 DIAGNOSIS — E785 Hyperlipidemia, unspecified: Secondary | ICD-10-CM | POA: Diagnosis not present

## 2020-09-12 DIAGNOSIS — E1169 Type 2 diabetes mellitus with other specified complication: Secondary | ICD-10-CM | POA: Diagnosis not present

## 2020-09-12 DIAGNOSIS — E1165 Type 2 diabetes mellitus with hyperglycemia: Secondary | ICD-10-CM | POA: Diagnosis not present

## 2020-09-12 DIAGNOSIS — Z23 Encounter for immunization: Secondary | ICD-10-CM | POA: Diagnosis not present

## 2020-09-12 DIAGNOSIS — T1490XD Injury, unspecified, subsequent encounter: Secondary | ICD-10-CM

## 2020-09-12 LAB — POCT GLYCOSYLATED HEMOGLOBIN (HGB A1C)
Est. average glucose Bld gHb Est-mCnc: 192
Hemoglobin A1C: 8.3 % — AB (ref 4.0–5.6)

## 2020-09-12 MED ORDER — TRULICITY 0.75 MG/0.5ML ~~LOC~~ SOAJ: 0.7500 mg | SUBCUTANEOUS | 2 refills | Status: DC

## 2020-09-12 NOTE — Progress Notes (Signed)
Established patient visit   Patient: Shaun Odonnell   DOB: 11-29-1965   54 y.o. Male  MRN: [id number] Visit Date: 09/12/2020  Today's healthcare provider: Trinna Post, PA-C   Chief Complaint  Patient presents with  . Diabetes  I,Shaun Odonnell Shaun Odonnell,acting as a scribe for [employer], PA-C.,have documented all relevant documentation on the behalf of Trinna Post, PA-C,as directed by  Trinna Post, PA-C while in the presence of Trinna Post, PA-C.  Subjective    HPI  Diabetes Mellitus Type II, Follow-up  Lab Results  Component Value Date   HGBA1C 8.3 (A) 09/12/2020   HGBA1C 8.4 (A) 05/03/2020   HGBA1C 9.0 (H) 01/19/2020   Wt Readings from Last 3 Encounters:  09/12/20 183 lb 3.2 oz (83.1 kg)  05/03/20 180 lb (81.6 kg)  01/19/20 187 lb (84.8 kg)   Last seen for diabetes 4 months ago.  Management since then includes started on Januvia 100 MG. He reports good compliance with treatment. He is not having side effects.  Symptoms: No fatigue No foot ulcerations  No appetite changes No nausea  No paresthesia of the feet  No polydipsia  No polyuria No visual disturbances   No vomiting     Home blood sugar records: fasting range: 120's-130's per pt  Episodes of hypoglycemia? No    Current insulin regiment: Lantus 10 units QHS Most Recent Eye Exam: 06/03/2020 Current exercise: none Current diet habits: in general, an "unhealthy" diet  Pertinent Labs: Lab Results  Component Value Date   CHOL 162 01/19/2020   HDL 46 01/19/2020   LDLCALC 91 01/19/2020   TRIG 143 01/19/2020   CHOLHDL 3.5 01/19/2020   Lab Results  Component Value Date   NA 137 01/19/2020   K 4.6 01/19/2020   CREATININE 0.67 (L) 01/19/2020   GFRNONAA 111 01/19/2020   GFRAA 129 01/19/2020   GLUCOSE 164 (H) 01/19/2020     --------------------------------------------------------------------------------------------------- Lipid/Cholesterol, Follow-up  Last lipid panel Other  pertinent labs  Lab Results  Component Value Date   CHOL 162 01/19/2020   HDL 46 01/19/2020   LDLCALC 91 01/19/2020   TRIG 143 01/19/2020   CHOLHDL 3.5 01/19/2020   Lab Results  Component Value Date   ALT 19 01/19/2020   AST 17 01/19/2020   PLT 146 (L) 01/19/2020   TSH 1.510 01/19/2020     He was last seen for this 4 months ago.  Management since that visit includes simvastatin 80 mg QHS.  He reports good compliance with treatment. He is not having side effects.   Symptoms: No chest pain No chest pressure/discomfort  No dyspnea No lower extremity edema  No numbness or tingling of extremity No orthopnea  No palpitations No paroxysmal nocturnal dyspnea  No speech difficulty No syncope   Current diet: in general, an "unhealthy" diet Current exercise: none  The 10-year ASCVD risk score Mikey Bussing DC Jr., et al., 2013) is: 12.7%  ---------------------------------------------------------------------------------------------------       Medications: Outpatient Medications Prior to Visit  Medication Sig  . acetaminophen (TYLENOL) 500 MG tablet Take 1,000 mg by mouth every 6 (six) hours as needed.   Marland Kitchen albuterol (VENTOLIN HFA) 108 (90 Base) MCG/ACT inhaler Inhale 2 puffs into the lungs every 6 (six) hours as needed for wheezing or shortness of breath.  . ALPRAZolam (XANAX) 0.5 MG tablet Take 0.5 mg by mouth 4 (four) times daily. Patient takes when wakes up(~0600)/ 1000/ 1600/ 2000  . Azelastine-Fluticasone 137-50  MCG/ACT SUSP Place 1 spray into both nostrils 2 (two) times daily.  . benzonatate (TESSALON) 100 MG capsule Take 1 capsule (100 mg total) by mouth 2 (two) times daily as needed for cough.  . carboxymethylcellulose (REFRESH PLUS) 0.5 % SOLN Place 2 drops into both eyes as needed (Dry eyes).  Marland Kitchen glucose blood test strip Use as instructed  . ibuprofen (ADVIL) 600 MG tablet Take 600 mg by mouth every 6 (six) hours as needed.   . Insulin Pen Needle (UNIFINE PENTIPS) 32G X 4 MM  MISC USE WITH INSULIN ONCE DAILY  . INVOKANA 300 MG TABS tablet TAKE 1 TABLET BY MOUTH ONCE DAILY BEFOREBREAFKAST  . LANTUS SOLOSTAR 100 UNIT/ML Solostar Pen INJECT 10 UNITS SUBCUTANEOUSLY AT BEDTIME  . metFORMIN (GLUCOPHAGE-XR) 500 MG 24 hr tablet TAKE 2 TABLETS BY MOUTH TWICE DAILY  . montelukast (SINGULAIR) 10 MG tablet Take 1 tablet (10 mg total) by mouth in the morning.  . Multiple Vitamins-Minerals (CENTRUM SILVER PO) Take 1 tablet by mouth daily.  Marland Kitchen OLANZapine (ZYPREXA) 15 MG tablet Take 15 mg by mouth at bedtime. At 2000  . ondansetron (ZOFRAN-ODT) 8 MG disintegrating tablet TAKE 1 TABLET BY MOUTH EVERY 8 HOURS AS NEEDED NAUSEA AND VOMITING  . OneTouch Delica Lancets 66Q MISC USE TO CHECK FASTING SUGAR ONCE DAILY  . promethazine-dextromethorphan (PROMETHAZINE-DM) 6.25-15 MG/5ML syrup Take 5 mLs by mouth at bedtime as needed.  . sertraline (ZOLOFT) 100 MG tablet Take 100 mg by mouth daily.   . simvastatin (ZOCOR) 80 MG tablet TAKE 1 TABLET BY MOUTH AT BEDTIME  . [DISCONTINUED] sitaGLIPtin (JANUVIA) 100 MG tablet Take 1 tablet (100 mg total) by mouth daily.   No facility-administered medications prior to visit.    Review of Systems  Constitutional: Negative.   Respiratory: Negative.   Cardiovascular: Negative.   Hematological: Negative.       Objective    BP 124/68 (BP Location: Left Arm, Patient Position: Sitting, Cuff Size: Large)   Pulse 60   Temp 98.2 F (36.8 C) (Oral)   Wt 183 lb 3.2 oz (83.1 kg)   SpO2 96%   BMI 27.86 kg/Odonnell    Physical Exam Constitutional:      Appearance: Normal appearance. He is normal weight.  Cardiovascular:     Rate and Rhythm: Normal rate and regular rhythm.     Heart sounds: Normal heart sounds.  Pulmonary:     Effort: Pulmonary effort is normal.     Breath sounds: Normal breath sounds.  Skin:    General: Skin is warm and dry.  Neurological:     General: No focal deficit present.     Mental Status: He is alert and oriented to  person, place, and time. Mental status is at baseline.  Psychiatric:        Mood and Affect: Mood normal.        Behavior: Behavior normal.       Results for orders placed or performed in visit on 09/12/20  POCT glycosylated hemoglobin (Hb A1C)  Result Value Ref Range   Hemoglobin A1C 8.3 (A) 4.0 - 5.6 %   HbA1c POC (<> result, manual entry)     HbA1c, POC (prediabetic range)     HbA1c, POC (controlled diabetic range)     Est. average glucose Bld gHb Est-mCnc 192     Assessment & Plan    1. Type 2 diabetes mellitus with hyperglycemia, without long-term current use of insulin (HCC)  A1c is relatively the same.  Discontinue Januvia. Keep metformin and invokana. Patient does not want to increase insulin. Explained remaining options including glipizide, pioglitazone, rosiglitazone, GLP1s. He is concerned about hypoglycemic episodes. I have explained risks relating to GLP1 medication and his history of pancreatitis. He is accepting of these risks and wishes to proceed with trulicity.   - Flu Vaccine QUAD 36+ mos IM - POCT glycosylated hemoglobin (Hb A1C) - Dulaglutide (TRULICITY) 5.46 TK/3.5WS SOPN; Inject 0.75 mg into the skin once a week.  Dispense: 2 mL; Refill: 2 - Comprehensive Metabolic Panel (CMET) - Lipid Profile  2. Hyperlipidemia associated with type 2 diabetes mellitus (HCC)  - Flu Vaccine QUAD 36+ mos IM - POCT glycosylated hemoglobin (Hb A1C)  3. Healing wound  - Tdap vaccine greater than or equal to 7yo IM   Return in about 3 months (around 12/13/2020) for DM, HTN .      I, Trinna Post, PA-C, have reviewed all documentation for this visit. The documentation on 09/14/20 for the exam, diagnosis, procedures, and orders are all accurate and complete.  The entirety of the information documented in the History of Present Illness, Review of Systems and Physical Exam were personally obtained by me. Portions of this information were initially documented by [facility] and reviewed by me for thoroughness and accuracy.     Paulene Floor  [facility] [phone] (phone) [phone] (fax)  [place]

## 2020-09-20 ENCOUNTER — Telehealth: Payer: Self-pay

## 2020-09-20 ENCOUNTER — Other Ambulatory Visit: Payer: Self-pay | Admitting: Physician Assistant

## 2020-09-20 DIAGNOSIS — E1165 Type 2 diabetes mellitus with hyperglycemia: Secondary | ICD-10-CM

## 2020-09-20 NOTE — Telephone Encounter (Signed)
The injection is once a week. He doesn't have to check his blood sugar before the trulicity injection.

## 2020-09-20 NOTE — Telephone Encounter (Signed)
Patient was advised and states he checks his blood sugars in the morning fasting. Just a [place]

## 2020-09-20 NOTE — Telephone Encounter (Signed)
Copied from [place] [phone]. Topic: Quick Communication - See Telephone Encounter >> Sep 20, 2020  9:47 AM [place] wrote: CRM for notification. See Telephone encounter for: 09/20/20.Dulaglutide (TRULICITY) 2.70 JJ/0.0XF SOPN Medication Date: 09/12/2020 Department: [facility] Ordering/Authorizing: Trinna Post, PA-C Order Providers Pt was started on above on 11/8 and told to ck his Blood Sugar at every injection but, can not remember if before or after injection. Want FU call from nurse.    [phone]

## 2020-10-25 ENCOUNTER — Encounter: Payer: Self-pay | Admitting: Family Medicine

## 2020-10-25 ENCOUNTER — Ambulatory Visit (INDEPENDENT_AMBULATORY_CARE_PROVIDER_SITE_OTHER): Payer: Medicare Other | Admitting: Family Medicine

## 2020-10-25 DIAGNOSIS — J449 Chronic obstructive pulmonary disease, unspecified: Secondary | ICD-10-CM

## 2020-10-25 MED ORDER — FLUTICASONE FUROATE-VILANTEROL 200-25 MCG/INH IN AEPB: 1.0000 | INHALATION_SPRAY | Freq: Every day | RESPIRATORY_TRACT | 3 refills | Status: DC

## 2020-10-25 NOTE — Progress Notes (Signed)
Virtual telephone visit    Virtual Visit via Telephone Note   This visit type was conducted due to national recommendations for restrictions regarding the COVID-19 Pandemic (e.g. social distancing) in an effort to limit this patient's exposure and mitigate transmission in our community. Due to his co-morbid illnesses, this patient is at least at moderate risk for complications without adequate follow up. This format is felt to be most appropriate for this patient at this time. The patient did not have access to video technology or had technical difficulties with video requiring transitioning to audio format only (telephone). Physical exam was limited to content and character of the telephone converstion.    Patient location: Home Provider location: Office  I discussed the limitations of evaluation and management by telemedicine and the availability of in person appointments. The patient expressed understanding and agreed to proceed.   Visit Date: 10/25/2020  Today's healthcare provider: Dortha Kernennis Damesha Lawler, PA-C   Chief Complaint  Patient presents with  . Cough   Subjective    Cough This is a chronic problem. Episode onset: 4 months ago. The problem has been unchanged. Associated symptoms include headaches and rhinorrhea. Pertinent negatives include no chest pain, chills, fever, shortness of breath or wheezing. He has tried prescription cough suppressant (Mucinex DM and other OTC cough syrup) for the symptoms. The treatment provided no relief. His past medical history is significant for COPD.    He has also tried Aetnaessalon Perels which he feels were ineffective and the Promethazine DM made his cough worse.    Past Medical History:  Diagnosis Date  . Alcohol abuse 04/29/2019  . Allergy   . Anxiety   . ARDS (adult respiratory distress syndrome) (HCC)   . ARDS (adult respiratory distress syndrome) (HCC)   . Bipolar disorder (HCC)   . Borderline diabetes   . Cataract   . Chronic  kidney disease   . COPD (chronic obstructive pulmonary disease) (HCC)    chronic cough and wheezing  . Depression   . Diabetes mellitus without complication (HCC)   . Dizziness    medication related  . Dyspnea    with exertion  . Elevated lipids   . Fatty liver   . Hypercholesterolemia   . Hypertension   . Pancreatitis   . Pneumonia    in past  . Pneumonia    in past  . Pre-diabetes    diet controlled  . Pulmonary embolism (HCC)   . Schizoaffective disorder [facility](HCC)    Past Surgical History:  Procedure Laterality Date  . CATARACT EXTRACTION W/PHACO Right 03/26/2018   Procedure: CATARACT EXTRACTION PHACO AND INTRAOCULAR LENS PLACEMENT (IOC) RIGHT BORDERLINE DIABETIC;  Surgeon: Lockie MolaBrasington, Chadwick, MD;  [facility];  Service: Ophthalmology;  Laterality: Right;  . CATARACT EXTRACTION W/PHACO Left 04/23/2018   Procedure: CATARACT EXTRACTION PHACO AND INTRAOCULAR LENS PLACEMENT (IOC)  LEFT BORDERLINE DIABETIC;  Surgeon: Lockie MolaBrasington, Chadwick, MD;  [facility];  Service: Ophthalmology;  Laterality: Left;  . COLONOSCOPY WITH PROPOFOL N/A 08/21/2017   Procedure: COLONOSCOPY WITH PROPOFOL;  Surgeon: Scot JunElliott, Robert T, MD;  [facility];  Service: Endoscopy;  Laterality: N/A;  . EYE SURGERY    . HERNIA REPAIR     inguinal and umbilical  . RIB RESECTION N/A 08/20/2019   Procedure: removal of xyphoid process;  Surgeon: Hulda Marinaks, Timothy, MD;  Location: [facility] ORS;  Service: Thoracic;  Laterality: N/A;  . UMBILICAL HERNIA REPAIR N/A 06/22/2019   Procedure: OPEN HERNIA REPAIR UMBILICAL ADULT;  Surgeon: Olean Ree, MD;  Location: [facility] ORS;  Service: General;  Laterality: N/A;  . UPPER GI ENDOSCOPY     Social History   Tobacco Use  . Smoking status: Current Every Day Smoker    Packs/day: 1.50    Years: 30.00    Pack years: 45.00    Types: Cigarettes  . Smokeless tobacco: Former Systems developer    Types: Snuff  Vaping Use  . Vaping Use: Former  Substance  Use Topics  . Alcohol use: Yes    Alcohol/week: 6.0 standard drinks    Types: 6 Cans of beer per week    Comment: 3 beers maybe 2x a week  . Drug use: Not Currently   Family History  Problem Relation Age of Onset  . Hyperlipidemia Mother   . Hypertension Father   . Dementia Maternal Grandmother   . Heart disease Maternal Grandfather   . Heart disease Paternal Grandmother   . Stroke Paternal Grandfather   . Diabetes Paternal Grandfather   . Diabetes Maternal Aunt    Allergies  Allergen Reactions  . Fentanyl Nausea And Vomiting  . Morphine And Related Shortness Of Breath  . Klonopin [Clonazepam] Other (See Comments)    Dystonic Reaction    . Pimozide Other (See Comments)    Dystonic Reaction   . Stelazine [Trifluoperazine] Other (See Comments)    Dystonic Reaction  . Azithromycin Other (See Comments)    Was told not to take Z pak due to his heart  . Montelukast     headache, nausea, feeling a little anxious, panic  . Tramadol Nausea Only      Medications: Outpatient Medications Prior to Visit  Medication Sig  . albuterol (VENTOLIN HFA) 108 (90 Base) MCG/ACT inhaler Inhale 2 puffs into the lungs every 6 (six) hours as needed for wheezing or shortness of breath.  . ALPRAZolam (XANAX) 0.5 MG tablet Take 0.5 mg by mouth 4 (four) times daily. Patient takes when wakes up(~0600)/ 1000/ 1600/ 2000  . Azelastine-Fluticasone 137-50 MCG/ACT SUSP Place 1 spray into both nostrils 2 (two) times daily.  . carboxymethylcellulose (REFRESH PLUS) 0.5 % SOLN Place 2 drops into both eyes as needed (Dry eyes).  . Dulaglutide (TRULICITY) A999333 [id number] SOPN Inject 0.75 mg into the skin once a week.  Marland Kitchen glucose blood test strip Use as instructed  . Insulin Pen Needle (UNIFINE PENTIPS) 32G X 4 MM MISC USE WITH INSULIN ONCE DAILY  . INVOKANA 300 MG TABS tablet TAKE 1 TABLET BY MOUTH ONCE DAILY BEFOREBREAFKAST  . LANTUS SOLOSTAR 100 UNIT/ML Solostar Pen INJECT 10 UNITS SUBCUTANEOUSLY AT BEDTIME   . metFORMIN (GLUCOPHAGE-XR) 500 MG 24 hr tablet TAKE 2 TABLETS BY MOUTH TWICE DAILY  . Multiple Vitamins-Minerals (CENTRUM SILVER PO) Take 1 tablet by mouth daily.  Marland Kitchen OLANZapine (ZYPREXA) 15 MG tablet Take 15 mg by mouth at bedtime. At 2000  . ondansetron (ZOFRAN-ODT) 8 MG disintegrating tablet TAKE 1 TABLET BY MOUTH EVERY 8 HOURS AS NEEDED NAUSEA AND VOMITING  . OneTouch Delica Lancets 99991111 MISC USE TO CHECK FASTING SUGAR ONCE DAILY  . sertraline (ZOLOFT) 100 MG tablet Take 100 mg by mouth daily.   . simvastatin (ZOCOR) 80 MG tablet TAKE 1 TABLET BY MOUTH AT BEDTIME  . benzonatate (TESSALON) 100 MG capsule Take 1 capsule (100 mg total) by mouth 2 (two) times daily as needed for cough. (Patient not taking: Reported on 10/25/2020)  . montelukast (SINGULAIR) 10 MG tablet Take 1 tablet (10 mg total) by mouth in  the morning. (Patient not taking: Reported on 10/25/2020)  . promethazine-dextromethorphan (PROMETHAZINE-DM) 6.25-15 MG/5ML syrup Take 5 mLs by mouth at bedtime as needed. (Patient not taking: Reported on 10/25/2020)  . [DISCONTINUED] acetaminophen (TYLENOL) 500 MG tablet Take 1,000 mg by mouth every 6 (six) hours as needed.   . [DISCONTINUED] ibuprofen (ADVIL) 600 MG tablet Take 600 mg by mouth every 6 (six) hours as needed.  (Patient not taking: Reported on 10/25/2020)   No facility-administered medications prior to visit.    Review of Systems  Constitutional: Negative for appetite change, chills, diaphoresis, fatigue and fever.  HENT: Positive for rhinorrhea. Negative for congestion, sinus pressure, sinus pain and sneezing.   Respiratory: Positive for cough. Negative for chest tightness, shortness of breath and wheezing.   Cardiovascular: Negative for chest pain and palpitations.  Gastrointestinal: Negative for abdominal pain, nausea and vomiting.  Neurological: Positive for headaches.      Objective    There were no vitals taken for this visit.  Some cough during telephonic  interview but no apparent respiratory distress.   Assessment & Plan     1. Chronic obstructive pulmonary disease, unspecified COPD type ([place]) Having a morning cough with clear to white sputum production the past 4 months. Mucinex-DM or Dayquil helps control cough but has become expensive because it its not covered by his insurance. Some PND but no significant wheeze today. Will give trial of Breo qd and may need evaluation by pulmonologist if no improvement. May continue antihistaine and Mucinex-DM prn. - fluticasone furoate-vilanterol (BREO ELLIPTA) 200-25 MCG/INH AEPB; Inhale 1 puff into the lungs daily.  Dispense: 28 each; Refill: 3   No follow-ups on file.    I discussed the assessment and treatment plan with the patient. The patient was provided an opportunity to ask questions and all were answered. The patient agreed with the plan and demonstrated an understanding of the instructions.   The patient was advised to call back or seek an in-person evaluation if the symptoms worsen or if the condition fails to improve as anticipated.  I provided 20 minutes of non-face-to-face time during this encounter.  I, Cece Milhouse, PA-C, have reviewed all documentation for this visit. The documentation on 10/25/20 for the exam, diagnosis, procedures, and orders are all accurate and complete.   Vernie Murders, PA-C [employer] [phone] (phone) [phone] (fax)  [place]

## 2020-11-02 ENCOUNTER — Telehealth: Payer: Self-pay

## 2020-11-02 NOTE — Telephone Encounter (Signed)
Copied from CRM [id number]. Topic: General - Other >> Nov 02, 2020 12:22 PM Dalphine Handing A wrote: Patient stated that [facility] [facility] sent him a letter stateing that they have tried reaching out to [place] via fax multiple times to get additional information in regards to the prior authorization forpatient [place] medication. Patient stated that the letter was datesd 12/20 and he was advised that if [facility] [facility] didn't receive the information within 14 days that he would be denied. Patient is requesting a callback once we have made contact with [employer] [facility] and provided information to them.  [phone] [facility]

## 2020-11-02 NOTE — Telephone Encounter (Signed)
Received fax from [employer] that Invokana was approved.

## 2020-11-02 NOTE — Telephone Encounter (Signed)
In 08/2020 we discontinued Januvia and you wanted the patient to continue metformin and Invokana. To your knowledge, has the patient tried or was intolerant to any other diabetic medication? This is one of the questions that was needed to complete the PA. Please advise. Thanks!

## 2020-11-02 NOTE — Telephone Encounter (Signed)
Yes farxiga caused headaches I think. It is in one of my previous encounters for DM.

## 2020-11-07 ENCOUNTER — Ambulatory Visit: Payer: Medicare Other | Attending: Internal Medicine

## 2020-11-07 DIAGNOSIS — Z23 Encounter for immunization: Secondary | ICD-10-CM

## 2020-11-07 NOTE — Progress Notes (Signed)
   Covid-19 Vaccination Clinic  Name:  Shaun Odonnell    MRN: [id number] DOB: 10-Apr-1966  11/07/2020  [name] was observed post Covid-19 immunization for 15 minutes without incident. He was provided with Vaccine Information Sheet and instruction to access the V-Safe system.   [name] was instructed to call 911 with any severe reactions post vaccine: Marland Kitchen Difficulty breathing  . Swelling of face and throat  . A fast heartbeat  . A bad rash all over body  . Dizziness and weakness   Immunizations Administered    Name Date Dose VIS Date Route   Pfizer COVID-19 Vaccine 11/07/2020  2:37 PM 0.3 mL 08/24/2020 Intramuscular   Manufacturer: [employer], [employer]   Lot: [id number]   NDC: [address]-9

## 2020-11-14 ENCOUNTER — Telehealth: Payer: Self-pay | Admitting: Physician Assistant

## 2020-11-14 ENCOUNTER — Other Ambulatory Visit: Payer: Self-pay | Admitting: Physician Assistant

## 2020-11-14 DIAGNOSIS — J449 Chronic obstructive pulmonary disease, unspecified: Secondary | ICD-10-CM

## 2020-11-14 DIAGNOSIS — J309 Allergic rhinitis, unspecified: Secondary | ICD-10-CM

## 2020-11-14 NOTE — Telephone Encounter (Signed)
Patient called to ask if he could get a script for the cough medicine Mucinex DM which he has been taking OTC.  He stated that it is very expensive and would like to know if he can get a prescription to lower the cost.  Please call to discuss at [phone]

## 2020-11-15 MED ORDER — DM-GUAIFENESIN ER 30-600 MG PO TB12: 1.0000 | ORAL_TABLET | Freq: Two times a day (BID) | ORAL | 1 refills | Status: DC

## 2020-11-15 NOTE — Telephone Encounter (Addendum)
Patient states insurance will not cover dextromethorphan-guaiFENesin ([place] DM) 30-600 MG 12hr tablet therefore the pharmacy advised patient to request a cough suppressant medication, patient would like PCP to prescribe an alternate and a cough syrup. Pharmacy recommended 600 guaifenesin or Dextromethorphan. Patient would like a follow up call today  [place], [place]. Phone:  [phone]  Fax:  [phone]

## 2020-11-15 NOTE — Telephone Encounter (Signed)
Pt calling again and is requesting a response. He states that [employer] prescribed him with Breo powder. He states that it opens him up, but that it does not get rid of the cough. He states that it seems that the mucinex is the only thing that seems to be working for him. He states that he is taking it 2 times a day. Please advise.

## 2020-11-15 NOTE — Telephone Encounter (Signed)
Please review. Thanks!  

## 2020-11-15 NOTE — Telephone Encounter (Signed)
Mucinex DM sent in.

## 2020-11-16 MED ORDER — GUAIFENESIN ER 600 MG PO TB12: 600.0000 mg | ORAL_TABLET | Freq: Two times a day (BID) | ORAL | 1 refills | Status: AC

## 2020-11-16 NOTE — Telephone Encounter (Signed)
Sent in mucinex.

## 2020-11-16 NOTE — Telephone Encounter (Signed)
Patient was advised and states that he will reach out to his insurance company to see if they will cover the mucinex DM.

## 2020-11-16 NOTE — Addendum Note (Signed)
Addended by: Trinna Post on: 11/16/2020 04:07 PM   Modules accepted: Orders

## 2020-12-01 ENCOUNTER — Other Ambulatory Visit: Payer: Self-pay | Admitting: Physician Assistant

## 2020-12-01 DIAGNOSIS — E1165 Type 2 diabetes mellitus with hyperglycemia: Secondary | ICD-10-CM

## 2020-12-01 NOTE — Telephone Encounter (Signed)
Requested Prescriptions  Pending Prescriptions Disp Refills  . TRULICITY 8.24 MP/5.3IR SOPN [Pharmacy Med Name: TRULICITY 4.43 XV/4.0GQ SUBQ SOLN M] 2 mL 2    Sig: INJECT 0.75MG  SUBQ ONCE A WEEK     Endocrinology:  Diabetes - GLP-1 Receptor Agonists Failed - 12/01/2020  9:06 AM      Failed - HBA1C is between 0 and 7.9 and within 180 days    Hemoglobin A1C  Date Value Ref Range Status  09/12/2020 8.3 (A) 4.0 - 5.6 % Final  08/03/2014 5.7 4.2 - 6.3 % Final    Comment:    The American Diabetes Association recommends that a primary goal of therapy should be <7% and that physicians should reevaluate the treatment regimen in patients with HbA1c values consistently >8%.    Hgb A1c MFr Bld  Date Value Ref Range Status  01/19/2020 9.0 (H) 4.8 - 5.6 % Final    Comment:             Prediabetes: 5.7 - 6.4          Diabetes: >6.4          Glycemic control for adults with diabetes: <7.0          Passed - Valid encounter within last 6 months    Recent Outpatient Visits          1 month ago Chronic obstructive pulmonary disease, unspecified COPD type [facility])   [place], Vickki Muff, PA-C   2 months ago Type 2 diabetes mellitus with hyperglycemia, without long-term current use of insulin [facility])   [place], [place], [place]   5 months ago Cough   [facility] Birdie Sons, MD   7 months ago Type 2 diabetes mellitus with hyperglycemia, without long-term current use of insulin [facility])   [place], [place], PA-C   10 months ago Type 2 diabetes mellitus with hyperglycemia, without long-term current use of insulin [facility])   [facility] [place], Wendee Beavers, [place]      Future Appointments            In 1 week Trinna Post, PA-C [employer], [facility]

## 2020-12-05 ENCOUNTER — Telehealth: Payer: Self-pay

## 2020-12-05 ENCOUNTER — Other Ambulatory Visit: Payer: Self-pay | Admitting: Physician Assistant

## 2020-12-05 DIAGNOSIS — E1165 Type 2 diabetes mellitus with hyperglycemia: Secondary | ICD-10-CM

## 2020-12-05 DIAGNOSIS — K8521 Alcohol induced acute pancreatitis with uninfected necrosis: Secondary | ICD-10-CM

## 2020-12-05 NOTE — Telephone Encounter (Signed)
Requested Prescriptions  Pending Prescriptions Disp Refills  . LANTUS SOLOSTAR 100 UNIT/ML Solostar Pen [Pharmacy Med Name: LANTUS SOLOSTAR 100 UNIT/ML SUBQ SO] 9 mL 0    Sig: INJECT 10 UNITS SUBCUTANEOUSLY AT BEDTIME     Endocrinology:  Diabetes - Insulins Failed - 12/05/2020  8:38 AM      Failed - HBA1C is between 0 and 7.9 and within 180 days    Hemoglobin A1C  Date Value Ref Range Status  09/12/2020 8.3 (A) 4.0 - 5.6 % Final  08/03/2014 5.7 4.2 - 6.3 % Final    Comment:    The American Diabetes Association recommends that a primary goal of therapy should be <7% and that physicians should reevaluate the treatment regimen in patients with HbA1c values consistently >8%.    Hgb A1c MFr Bld  Date Value Ref Range Status  01/19/2020 9.0 (H) 4.8 - 5.6 % Final    Comment:             Prediabetes: 5.7 - 6.4          Diabetes: >6.4          Glycemic control for adults with diabetes: <7.0          Passed - Valid encounter within last 6 months    Recent Outpatient Visits          1 month ago Chronic obstructive pulmonary disease, unspecified COPD type [facility])   [place], Vickki Muff, PA-C   2 months ago Type 2 diabetes mellitus with hyperglycemia, without long-term current use of insulin [facility])   [place], [place], [place]   5 months ago Cough   [facility] Birdie Sons, MD   7 months ago Type 2 diabetes mellitus with hyperglycemia, without long-term current use of insulin [facility])   [place], [place], PA-C   10 months ago Type 2 diabetes mellitus with hyperglycemia, without long-term current use of insulin [facility])   [facility] [place], Wendee Beavers, [place]      Future Appointments            In 1 week Trinna Post, PA-C [employer], [facility]

## 2020-12-05 NOTE — Telephone Encounter (Signed)
Referral to GI is fine

## 2020-12-05 NOTE — Telephone Encounter (Signed)
Copied from [place] [id number]. Topic: Referral - Request for Referral >> Dec 05, 2020  2:55 PM Tessa Lerner A wrote: Reason for CRM: Patient is requesting a referral to see Dr. Allen Norris at [facility]  fax: [phone] phone: [phone] Patient received a letter of referral from previous PCP but that letter has expired and [place] Gertie Fey is requiring a new one.

## 2020-12-05 NOTE — Telephone Encounter (Signed)
Patients last office visit with you was 10/25/20 looking back in patients chart referral was ordered 01/28/2019 for alcohol induced chronic pancreatitis, please advise if okay for referral to be placed or do you need patient to return back to address. KW

## 2020-12-05 NOTE — Addendum Note (Signed)
Addended by: Minette Headland on: 12/05/2020 05:00 PM   Modules accepted: Orders

## 2020-12-06 ENCOUNTER — Other Ambulatory Visit: Payer: Self-pay | Admitting: Physician Assistant

## 2020-12-06 DIAGNOSIS — E1165 Type 2 diabetes mellitus with hyperglycemia: Secondary | ICD-10-CM

## 2020-12-06 NOTE — Telephone Encounter (Signed)
Patient was advised.  

## 2020-12-07 NOTE — Telephone Encounter (Signed)
Requested Prescriptions  Pending Prescriptions Disp Refills  . OneTouch Delica Lancets 37S MISC [Pharmacy Med Name: [facility] DELICA LANCETS 28B] 151 each 1    Sig: USE TO CHECK FASTING SUGAR TWICE DAILY     Endocrinology: Diabetes - Testing Supplies Passed - 12/06/2020  4:16 PM      Passed - Valid encounter within last 12 months    Recent Outpatient Visits          1 month ago Chronic obstructive pulmonary disease, unspecified COPD type ([place])   [place], Vickki Muff, PA-C   2 months ago Type 2 diabetes mellitus with hyperglycemia, without long-term current use of insulin [facility])   [place], [place], [place]   5 months ago Cough   [facility] Birdie Sons, MD   7 months ago Type 2 diabetes mellitus with hyperglycemia, without long-term current use of insulin [facility])   [place], [place], PA-C   10 months ago Type 2 diabetes mellitus with hyperglycemia, without long-term current use of insulin [facility])   [place], Wendee Bea[place], [place]      Future Appointments            In 6 days Trinna Post, PA-C [employer], [facility]           . LANTUS SOLOSTAR 100 UNIT/ML Solostar Pen [Pharmacy Med Name: LANTUS SOLOSTAR 100 UNIT/ML SUBQ SO] 9 mL 0    Sig: INJECT 10 UNITS SUBCUTANEOUSLY AT BEDTIME     Endocrinology:  Diabetes - Insulins Failed - 12/06/2020  4:16 PM      Failed - HBA1C is between 0 and 7.9 and within 180 days    Hemoglobin A1C  Date Value Ref Range Status  09/12/2020 8.3 (A) 4.0 - 5.6 % Final  08/03/2014 5.7 4.2 - 6.3 % Final    Comment:    The American Diabetes Association recommends that a primary goal of therapy should be <7% and that physicians should reevaluate the treatment regimen in patients with HbA1c values consistently >8%.    Hgb A1c MFr Bld  Date Value Ref Range Status  01/19/2020 9.0 (H) 4.8 - 5.6 % Final    Comment:              Prediabetes: 5.7 - 6.4          Diabetes: >6.4          Glycemic control for adults with diabetes: <7.0          Passed - Valid encounter within last 6 months    Recent Outpatient Visits          1 month ago Chronic obstructive pulmonary disease, unspecified COPD type [facility])   [place], Vickki Muff, PA-C   2 months ago Type 2 diabetes mellitus with hyperglycemia, without long-term current use of insulin [facility])   [place], [place], [place]   5 months ago Cough   [facility] Birdie Sons, MD   7 months ago Type 2 diabetes mellitus with hyperglycemia, without long-term current use of insulin [facility])   [place], [place], PA-C   10 months ago Type 2 diabetes mellitus with hyperglycemia, without long-term current use of insulin [facility])   [facility] Trinna Post, [place]      Future Appointments            In 6 days Trinna Post,  PA-C [employer], [facility]

## 2020-12-13 ENCOUNTER — Encounter: Payer: Self-pay | Admitting: Physician Assistant

## 2020-12-13 ENCOUNTER — Ambulatory Visit (INDEPENDENT_AMBULATORY_CARE_PROVIDER_SITE_OTHER): Payer: Medicare Other | Admitting: Physician Assistant

## 2020-12-13 ENCOUNTER — Other Ambulatory Visit: Payer: Self-pay

## 2020-12-13 VITALS — BP 129/70 | HR 66 | Temp 98.4°F | Wt 185.3 lb

## 2020-12-13 DIAGNOSIS — R519 Headache, unspecified: Secondary | ICD-10-CM

## 2020-12-13 DIAGNOSIS — K8521 Alcohol induced acute pancreatitis with uninfected necrosis: Secondary | ICD-10-CM | POA: Diagnosis not present

## 2020-12-13 DIAGNOSIS — E785 Hyperlipidemia, unspecified: Secondary | ICD-10-CM

## 2020-12-13 DIAGNOSIS — J449 Chronic obstructive pulmonary disease, unspecified: Secondary | ICD-10-CM | POA: Diagnosis not present

## 2020-12-13 DIAGNOSIS — E1165 Type 2 diabetes mellitus with hyperglycemia: Secondary | ICD-10-CM

## 2020-12-13 DIAGNOSIS — F319 Bipolar disorder, unspecified: Secondary | ICD-10-CM

## 2020-12-13 DIAGNOSIS — E1169 Type 2 diabetes mellitus with other specified complication: Secondary | ICD-10-CM | POA: Diagnosis not present

## 2020-12-13 LAB — POCT GLYCOSYLATED HEMOGLOBIN (HGB A1C)
Est. average glucose Bld gHb Est-mCnc: 189
Hemoglobin A1C: 8.2 % — AB (ref 4.0–5.6)

## 2020-12-13 MED ORDER — TRULICITY 1.5 MG/0.5ML ~~LOC~~ SOAJ: 1.5000 mg | SUBCUTANEOUS | 2 refills | Status: DC

## 2020-12-13 MED ORDER — SPIRIVA RESPIMAT 2.5 MCG/ACT IN AERS: 2.0000 | INHALATION_SPRAY | Freq: Every day | RESPIRATORY_TRACT | 1 refills | Status: DC

## 2020-12-13 NOTE — Progress Notes (Signed)
Established patient visit   Patient: Shaun Odonnell   DOB: Oct 24, 1966   55 y.o. Male  MRN: [id number] Visit Date: 12/13/2020  Today's healthcare provider: Trinna Post, PA-C   Chief Complaint  Patient presents with  . Diabetes  I,Yashica Sterbenz M Gwynn Crossley,acting as a scribe for [employer], PA-C.,have documented all relevant documentation on the behalf of Trinna Post, PA-C,as directed by  Trinna Post, PA-C while in the presence of Trinna Post, PA-C.  Subjective    HPI  Diabetes Mellitus Type II, Follow-up  Lab Results  Component Value Date   HGBA1C 8.2 (A) 12/13/2020   HGBA1C 8.3 (A) 09/12/2020   HGBA1C 8.4 (A) 05/03/2020   Wt Readings from Last 3 Encounters:  12/13/20 185 lb 4.8 oz (84.1 kg)  09/12/20 183 lb 3.2 oz (83.1 kg)  05/03/20 180 lb (81.6 kg)   Last seen for diabetes 3 months ago.  Management since then includes discontinued Januvia and stayed on  metformin and invokana. Patient does not want to increase insulin.Marland Kitchen He reports good compliance with treatment. He is not having side effects.  Symptoms: No fatigue No foot ulcerations  No appetite changes No nausea  No paresthesia of the feet  No polydipsia  No polyuria No visual disturbances   No vomiting     Home blood sugar records: fasting range: 120's-160's  Episodes of hypoglycemia? No    Current insulin regiment: Lantus 10 units QHS Most Recent Eye Exam: 06/03/2020 Current exercise: housecleaning and no regular exercise Current diet habits: well balanced  Pertinent Labs: Lab Results  Component Value Date   CHOL 162 01/19/2020   HDL 46 01/19/2020   LDLCALC 91 01/19/2020   TRIG 143 01/19/2020   CHOLHDL 3.5 01/19/2020   Lab Results  Component Value Date   NA 137 01/19/2020   K 4.6 01/19/2020   CREATININE 0.67 (L) 01/19/2020   GFRNONAA 111 01/19/2020   GFRAA 129 01/19/2020   GLUCOSE 164 (H) 01/19/2020      --------------------------------------------------------------------------------------------------- Lipid/Cholesterol, Follow-up  Last lipid panel Other pertinent labs  Lab Results  Component Value Date   CHOL 162 01/19/2020   HDL 46 01/19/2020   LDLCALC 91 01/19/2020   TRIG 143 01/19/2020   CHOLHDL 3.5 01/19/2020   Lab Results  Component Value Date   ALT 19 01/19/2020   AST 17 01/19/2020   PLT 146 (L) 01/19/2020   TSH 1.510 01/19/2020     He was last seen for this 4 months ago.  Management since that visit includes continue statin .  He reports good compliance with treatment. He is not having side effects.   Symptoms: No chest pain No chest pressure/discomfort  No dyspnea No lower extremity edema  No numbness or tingling of extremity No orthopnea  No palpitations No paroxysmal nocturnal dyspnea  No speech difficulty No syncope   Current diet: in general, an "unhealthy" diet Current exercise: none  The 10-year ASCVD risk score Mikey Bussing DC Jr., et al., 2013) is: 13.6%  --------------------------------------------------------------------------------------------------- COPD, Follow up  He was last seen for this 2 months ago. Changes made include started breo. Patient reports he can't tolerate this, it made him jitter. Reports it made his cough worse. He has been taking mucinex and delsym.    He reports poor compliance with treatment. He is having side effects. Jitteriness he uses rescue inhaler three to four times per months. He IS experiencing cough. He is NOT experiencing fatigue or chills. he  reports breathing is Unchanged.  Pulmonary Functions Testing Results:  No results found for: FEV1, FVC, FEV1FVC, TLC  -----------------------------------------------------------------------------------------       Medications: Outpatient Medications Prior to Visit  Medication Sig  . albuterol (VENTOLIN HFA) 108 (90 Base) MCG/ACT inhaler Inhale 2 puffs into the  lungs every 6 (six) hours as needed for wheezing or shortness of breath.  . ALPRAZolam (XANAX) 0.5 MG tablet Take 0.5 mg by mouth 4 (four) times daily. Patient takes when wakes up(~0600)/ 1000/ 1600/ 2000  . Azelastine-Fluticasone 137-50 MCG/ACT SUSP USE 1 SPRAY INTO EACH NOSTRIL TWICE DAILY  . carboxymethylcellulose (REFRESH PLUS) 0.5 % SOLN Place 2 drops into both eyes as needed (Dry eyes).  Marland Kitchen glucose blood test strip Use as instructed  . guaiFENesin (MUCINEX) 600 MG 12 hr tablet Take 1 tablet (600 mg total) by mouth 2 (two) times daily.  . Insulin Pen Needle (UNIFINE PENTIPS) 32G X 4 MM MISC USE WITH INSULIN ONCE DAILY  . INVOKANA 300 MG TABS tablet TAKE 1 TABLET BY MOUTH ONCE DAILY BEFOREBREAFKAST  . LANTUS SOLOSTAR 100 UNIT/ML Solostar Pen INJECT 10 UNITS SUBCUTANEOUSLY AT BEDTIME  . metFORMIN (GLUCOPHAGE-XR) 500 MG 24 hr tablet TAKE 2 TABLETS BY MOUTH TWICE DAILY  . Multiple Vitamins-Minerals (CENTRUM SILVER PO) Take 1 tablet by mouth daily.  Marland Kitchen OLANZapine (ZYPREXA) 15 MG tablet Take 15 mg by mouth at bedtime. At 2000  . ondansetron (ZOFRAN-ODT) 8 MG disintegrating tablet TAKE 1 TABLET BY MOUTH EVERY 8 HOURS AS NEEDED NAUSEA AND VOMITING  . OneTouch Delica Lancets 09G MISC USE TO CHECK FASTING SUGAR TWICE DAILY  . sertraline (ZOLOFT) 100 MG tablet Take 100 mg by mouth daily.   . simvastatin (ZOCOR) 80 MG tablet TAKE 1 TABLET BY MOUTH AT BEDTIME  . [DISCONTINUED] fluticasone furoate-vilanterol (BREO ELLIPTA) 200-25 MCG/INH AEPB Inhale 1 puff into the lungs daily.  . [DISCONTINUED] TRULICITY 2.83 MO/2.9UT SOPN INJECT 0.75MG  SUBQ ONCE A WEEK  . [DISCONTINUED] benzonatate (TESSALON) 100 MG capsule Take 1 capsule (100 mg total) by mouth 2 (two) times daily as needed for cough. (Patient not taking: Reported on 12/13/2020)  . [DISCONTINUED] dextromethorphan-guaiFENesin (MUCINEX DM) 30-600 MG 12hr tablet Take 1 tablet by mouth 2 (two) times daily. (Patient not taking: Reported on 12/13/2020)  .  [DISCONTINUED] montelukast (SINGULAIR) 10 MG tablet Take 1 tablet (10 mg total) by mouth in the morning. (Patient not taking: Reported on 12/13/2020)  . [DISCONTINUED] promethazine-dextromethorphan (PROMETHAZINE-DM) 6.25-15 MG/5ML syrup Take 5 mLs by mouth at bedtime as needed. (Patient not taking: Reported on 12/13/2020)   No facility-administered medications prior to visit.    Review of Systems  Constitutional: Negative.   Respiratory: Negative.   Hematological: Negative.   Psychiatric/Behavioral: Negative.        Objective    BP 129/70 (BP Location: Left Arm, Patient Position: Sitting, Cuff Size: Large)   Pulse 66   Temp 98.4 F (36.9 C) (Oral)   Wt 185 lb 4.8 oz (84.1 kg)   SpO2 97%   BMI 28.17 kg/m     Physical Exam Constitutional:      Appearance: Normal appearance.  Cardiovascular:     Rate and Rhythm: Normal rate and regular rhythm.     Heart sounds: Normal heart sounds.  Pulmonary:     Effort: Pulmonary effort is normal.     Breath sounds: Wheezing present.  Abdominal:     General: Abdomen is protuberant. Bowel sounds are normal. There is no distension.     Palpations:  Abdomen is soft. There is no hepatomegaly or mass.     Tenderness: There is no abdominal tenderness. There is no guarding or rebound.     Hernia: No hernia is present.  Skin:    General: Skin is warm and dry.  Neurological:     General: No focal deficit present.     Mental Status: He is alert and oriented to person, place, and time. Mental status is at baseline.  Psychiatric:        Mood and Affect: Mood normal.        Behavior: Behavior normal.       Results for orders placed or performed in visit on 12/13/20  POCT glycosylated hemoglobin (Hb A1C)  Result Value Ref Range   Hemoglobin A1C 8.2 (A) 4.0 - 5.6 %   HbA1c POC (<> result, manual entry)     HbA1c, POC (prediabetic range)     HbA1c, POC (controlled diabetic range)     Est. average glucose Bld gHb Est-mCnc 189     Assessment &  Plan    1. Type 2 diabetes mellitus with hyperglycemia, without long-term current use of insulin (HCC)  A1c has not changed much despite addition of trulicity 9.37 mg subq once weekly. Patient would like to increase this dose vs insulin, counseled about risk of pancreatitis. Will recheck c peptide, last checked 2 years ago. Patient continues to use alcohol.   - POCT glycosylated hemoglobin (Hb A1C) - Dulaglutide (TRULICITY) 1.5 JI/9.6VE SOPN; Inject 1.5 mg into the skin once a week.  Dispense: 2 mL; Refill: 2 - Comprehensive Metabolic Panel (CMET) - C-peptide  2. Hyperlipidemia associated with type 2 diabetes mellitus ([place])  - Lipid Profile  3. Chronic obstructive pulmonary disease, unspecified COPD type ([place])  Counseled cough likely from ucontrolled COPD. Reports he can't tolerate breo. Will try spiriva as below. Counseled on smoking cessation.   - Tiotropium Bromide Monohydrate (SPIRIVA RESPIMAT) 2.5 MCG/ACT AERS; Inhale 2 puffs into the lungs daily.  Dispense: 4 g; Refill: 1  4. Alcohol-induced acute pancreatitis with uninfected necrosis  - Lipase - Amylase  5. Nonintractable headache, unspecified chronicity pattern, unspecified headache type  Patient reports whooshing noise in head. Recommend seeing neurologist. He has been dismissed from [place] [facility]. Recommend [place] or [place]. Defers today, will let me know if he wants referral.     No follow-ups on file.      ITrinna Post, PA-C, have reviewed all documentation for this visit. The documentation on 12/13/20 for the exam, diagnosis, procedures, and orders are all accurate and complete.  The entirety of the information documented in the History of Present Illness, Review of Systems and Physical Exam were personally obtained by me. Portions of this information were initially documented by [facility] and reviewed by me for thoroughness and accuracy.     Paulene Floor  [facility] [phone] (phone) [phone] (fax)  [place]

## 2020-12-14 LAB — COMPREHENSIVE METABOLIC PANEL
ALT: 18 IU/L (ref 0–44)
AST: 12 IU/L (ref 0–40)
Albumin/Globulin Ratio: 2 (ref 1.2–2.2)
Albumin: 4.7 g/dL (ref 3.8–4.9)
Alkaline Phosphatase: 103 IU/L (ref 44–121)
BUN/Creatinine Ratio: 13 (ref 9–20)
BUN: 9 mg/dL (ref 6–24)
Bilirubin Total: 0.3 mg/dL (ref 0.0–1.2)
CO2: 25 mmol/L (ref 20–29)
Calcium: 9.7 mg/dL (ref 8.7–10.2)
Chloride: 99 mmol/L (ref 96–106)
Creatinine, Ser: 0.67 mg/dL — ABNORMAL LOW (ref 0.76–1.27)
GFR calc Af Amer: 128 mL/min/{1.73_m2} (ref >59)
GFR calc non Af Amer: 110 mL/min/{1.73_m2} (ref >59)
Globulin, Total: 2.4 g/dL (ref 1.5–4.5)
Glucose: 147 mg/dL — ABNORMAL HIGH (ref 65–99)
Potassium: 4.1 mmol/L (ref 3.5–5.2)
Sodium: 139 mmol/L (ref 134–144)
Total Protein: 7.1 g/dL (ref 6.0–8.5)

## 2020-12-14 LAB — LIPID PANEL
Chol/HDL Ratio: 3.7 ratio (ref 0.0–5.0)
Cholesterol, Total: 168 mg/dL (ref 100–199)
HDL: 45 mg/dL (ref >39)
LDL Chol Calc (NIH): 88 mg/dL (ref 0–99)
Triglycerides: 209 mg/dL — ABNORMAL HIGH (ref 0–149)
VLDL Cholesterol Cal: 35 mg/dL (ref 5–40)

## 2020-12-14 LAB — AMYLASE: Amylase: 37 U/L (ref 31–110)

## 2020-12-14 LAB — LIPASE: Lipase: 17 U/L (ref 13–78)

## 2020-12-15 ENCOUNTER — Other Ambulatory Visit: Payer: Self-pay | Admitting: Physician Assistant

## 2020-12-15 DIAGNOSIS — R110 Nausea: Secondary | ICD-10-CM

## 2020-12-15 DIAGNOSIS — J449 Chronic obstructive pulmonary disease, unspecified: Secondary | ICD-10-CM

## 2020-12-15 NOTE — Telephone Encounter (Signed)
Requested medications are due for refill today yes  Requested medications are on the active medication list yes  Last refill 4/30   Notes to clinic Not Delegated

## 2020-12-19 ENCOUNTER — Telehealth: Payer: Self-pay

## 2020-12-19 DIAGNOSIS — R519 Headache, unspecified: Secondary | ICD-10-CM

## 2020-12-19 NOTE — Telephone Encounter (Signed)
Copied from [place] [id number]. Topic: Referral - Request for Referral >> Dec 16, 2020  3:45 PM Alanda Slim E wrote: Has patient seen PCP for this complaint? Yes *If NO, is insurance requiring patient see PCP for this issue before PCP can refer them? Referral for which specialty: Neurologist  Preferred provider/office: Physical medicine and rehab/ TBI clinic at [facility] /phone# [phone] Reason for referral: Pts heart beat thumbs in his head and ears and the TBI clinic at [facility] needs a referral to see the Pt

## 2020-12-20 ENCOUNTER — Telehealth: Payer: Self-pay

## 2020-12-20 NOTE — Telephone Encounter (Signed)
Copied from [place] [id number]. Topic: Quick Communication - See Telephone Encounter >> Dec 20, 2020  3:00 PM Loma [place] wrote: CRM for notification. See Telephone encounter for: 12/20/20. Pt is calling in in re to his labs and wants a cb, not released to [facility] he also wants info on referral. [phone]

## 2020-12-20 NOTE — Telephone Encounter (Signed)
Referral placed.

## 2020-12-21 NOTE — Telephone Encounter (Signed)
Fabio Bering, can you please review pt's labs before I call him? I will give him an update on the referral. I think its still in process. Thanks!

## 2020-12-22 NOTE — Telephone Encounter (Signed)
Patient was advised and said thank you.

## 2020-12-22 NOTE — Telephone Encounter (Signed)
Labs stable, pancreatic enzymes normal.

## 2021-01-04 ENCOUNTER — Other Ambulatory Visit: Payer: Self-pay | Admitting: Physician Assistant

## 2021-01-04 DIAGNOSIS — J309 Allergic rhinitis, unspecified: Secondary | ICD-10-CM

## 2021-01-07 ENCOUNTER — Other Ambulatory Visit: Payer: Self-pay | Admitting: Physician Assistant

## 2021-01-07 DIAGNOSIS — J449 Chronic obstructive pulmonary disease, unspecified: Secondary | ICD-10-CM

## 2021-01-07 NOTE — Telephone Encounter (Signed)
Requested Prescriptions  Pending Prescriptions Disp Refills   [place] 2.5 MCG/ACT AERS [Pharmacy Med Name: SPIRIVA RESPIMAT 2.5 MCG/ACT INH AE] 4 g 1    Sig: INHALE 2 PUFFS BY MOUTH ONCE DAILY     Pulmonology:  Anticholinergic Agents Passed - 01/07/2021  3:23 PM      Passed - Valid encounter within last 12 months    Recent Outpatient Visits          3 weeks ago Type 2 diabetes mellitus with hyperglycemia, without long-term current use of insulin [facility])   [place], Adriana M, PA-C   2 months ago Chronic obstructive pulmonary disease, unspecified COPD type [facility])   [place], Vickki Muff, PA-C   3 months ago Type 2 diabetes mellitus with hyperglycemia, without long-term current use of insulin [facility])   [place], [place], [place]   6 months ago Cough   [facility] Birdie Sons, MD   8 months ago Type 2 diabetes mellitus with hyperglycemia, without long-term current use of insulin [facility])   [facility], Wendee Beavers, [place]      Future Appointments            In 3 months Terrilee Croak, Wendee Beavers, PA-C [employer], [facility]

## 2021-01-09 ENCOUNTER — Other Ambulatory Visit: Payer: Self-pay | Admitting: Physician Assistant

## 2021-01-18 ENCOUNTER — Other Ambulatory Visit: Payer: Self-pay | Admitting: Physician Assistant

## 2021-01-18 DIAGNOSIS — E1165 Type 2 diabetes mellitus with hyperglycemia: Secondary | ICD-10-CM

## 2021-01-18 NOTE — Telephone Encounter (Signed)
Requested Prescriptions  Pending Prescriptions Disp Refills  . metFORMIN (GLUCOPHAGE-XR) 500 MG 24 hr tablet [Pharmacy Med Name: METFORMIN HCL ER 500 MG TAB] 360 tablet 0    Sig: TAKE 2 TABLETS BY MOUTH TWICE DAILY     Endocrinology:  Diabetes - Biguanides Failed - 01/18/2021  2:25 PM      Failed - Cr in normal range and within 360 days    Creatinine  Date Value Ref Range Status  11/26/2014 0.79 0.60 - 1.30 mg/dL Final   Creatinine, Ser  Date Value Ref Range Status  12/13/2020 0.67 (L) 0.76 - 1.27 mg/dL Final         Failed - HBA1C is between 0 and 7.9 and within 180 days    Hemoglobin A1C  Date Value Ref Range Status  12/13/2020 8.2 (A) 4.0 - 5.6 % Final  08/03/2014 5.7 4.2 - 6.3 % Final    Comment:    The American Diabetes Association recommends that a primary goal of therapy should be <7% and that physicians should reevaluate the treatment regimen in patients with HbA1c values consistently >8%.    Hgb A1c MFr Bld  Date Value Ref Range Status  01/19/2020 9.0 (H) 4.8 - 5.6 % Final    Comment:             Prediabetes: 5.7 - 6.4          Diabetes: >6.4          Glycemic control for adults with diabetes: <7.0          Passed - eGFR in normal range and within 360 days    EGFR (African American)  Date Value Ref Range Status  11/26/2014 >60 >30m/min Final  06/15/2014 >60  Final   GFR calc Af Amer  Date Value Ref Range Status  12/13/2020 128 >59 mL/min/1.73 Final    Comment:    **In accordance with recommendations from the NKF-ASN Task force,**   [facility] is in the process of updating its eGFR calculation to the   2021 CKD-EPI creatinine equation that estimates kidney function   without a race variable.    EGFR (Non-African Amer.)  Date Value Ref Range Status  11/26/2014 >60 >627mmin Final    Comment:    eGFR values <6071min/1.73 m2 may be an indication of chronic kidney disease (CKD). Calculated eGFR, using the MRDR Study equation, is useful in  patients with  stable renal function. The eGFR calculation will not be reliable in acutely ill patients when serum creatinine is changing rapidly. It is not useful in patients on dialysis. The eGFR calculation may not be applicable to patients at the low and high extremes of body sizes, pregnant women, and vegetarians.   06/15/2014 >60  Final    Comment:    eGFR values <54m43mn/1.73 m2 may be an indication of chronic kidney disease (CKD). Calculated eGFR is useful in patients with stable renal function. The eGFR calculation will not be reliable in acutely ill patients when serum creatinine is changing rapidly. It is not useful in  patients on dialysis. The eGFR calculation may not be applicable to patients at the low and high extremes of body sizes, pregnant women, and vegetarians.    GFR calc non Af Amer  Date Value Ref Range Status  12/13/2020 110 >59 mL/min/1.73 Final         Passed - Valid encounter within last 6 months    Recent Outpatient Visits  1 month ago Type 2 diabetes mellitus with hyperglycemia, without long-term current use of insulin [facility])   [place], [place], [place]   2 months ago Chronic obstructive pulmonary disease, unspecified COPD type [facility]   [place], Vickki Muff, PA-C   4 months ago Type 2 diabetes mellitus with hyperglycemia, without long-term current use of insulin [facility])   [place], [place], [place]   6 months ago Cough   [facility] Birdie Sons, MD   8 months ago Type 2 diabetes mellitus with hyperglycemia, without long-term current use of insulin [facility])   [facility] Trinna Post, [place]      Future Appointments            In 2 months Trinna Post, PA-C [employer], [facility]

## 2021-01-24 ENCOUNTER — Ambulatory Visit (INDEPENDENT_AMBULATORY_CARE_PROVIDER_SITE_OTHER): Payer: Medicare Other | Admitting: Gastroenterology

## 2021-01-24 ENCOUNTER — Other Ambulatory Visit: Payer: Self-pay

## 2021-01-24 ENCOUNTER — Encounter: Payer: Self-pay | Admitting: Gastroenterology

## 2021-01-24 VITALS — BP 104/62 | HR 72 | Ht 68.0 in | Wt 183.2 lb

## 2021-01-24 DIAGNOSIS — K859 Acute pancreatitis without necrosis or infection, unspecified: Secondary | ICD-10-CM | POA: Diagnosis not present

## 2021-01-24 NOTE — Progress Notes (Signed)
Gastroenterology Consultation  Referring Provider:     Paulene Floor Primary Care Physician:  Paulene Floor Primary Gastroenterologist:  Dr. Allen Norris     Reason for Consultation:     History of alcoholic pancreatitis        HPI:   JAYEN BROMWELL is a 55 y.o. y/o male referred for consultation & management of history of alcoholic pancreatitis by Dr. Terrilee Croak, Wendee Beavers, PA-C.  This patient comes in today with a history of alcoholic pancreatitis but on his last admission for pancreatitis back in 2019 he was found to have pancreatic necrosis with a follow-up imaging showing a pseudocyst.  The patient states that he drinks approximately 3 beers a month.  The patient was seen by Dr. Vira Agar before he retired and had been discharged from the [facility] from his primary care provider.  Patient is now seeing [facility] as his primary care provider.  He was found to have polyps and is in need of a repeat colonoscopy in 2023.  He is not having any issues at the present time except some intermittent abdominal pain.  Patient also suffers from diabetes and states that if he eats too late at night he will wake up usually around 3:00 in the morning and vomits up the food that he had eaten many hours before.  There is no report of any unexplained weight loss fevers chills nausea or vomiting except those nighttime episodes.  He also denies any black stools or bloody stools.  Past Medical History:  Diagnosis Date  . Alcohol abuse 04/29/2019  . Allergy   . Anxiety   . ARDS (adult respiratory distress syndrome) ([place])   . ARDS (adult respiratory distress syndrome) ([place])   . Bipolar disorder ([place])   . Borderline diabetes   . Cataract   . Chronic kidney disease   . COPD (chronic obstructive pulmonary disease) (H[facility])    chronic cough and wheezing  . Depression   . Diabetes mellitus without complication ([place])   . Dizziness    medication related  . Dyspnea    with exertion   . Elevated lipids   . Fatty liver   . Hypercholesterolemia   . Hypertension   . Pancreatitis   . Pneumonia    in past  . Pneumonia    in past  . Pre-diabetes    diet controlled  . Pulmonary embolism ([place])   . Schizoaffective disorder [facility])     Past Surgical History:  Procedure Laterality Date  . CATARACT EXTRACTION W/PHACO Right 03/26/2018   Procedure: CATARACT EXTRACTION PHACO AND INTRAOCULAR LENS PLACEMENT ([place]) RIGHT BORDERLINE DIABETIC;  Surgeon: Leandrew Koyanagi, MD;  Location: [facility];  Service: Ophthalmology;  Laterality: Right;  . CATARACT EXTRACTION W/PHACO Left 04/23/2018   Procedure: CATARACT EXTRACTION PHACO AND INTRAOCULAR LENS PLACEMENT ([place])  LEFT BORDERLINE DIABETIC;  Surgeon: Leandrew Koyanagi, MD;  Location: [facility];  Service: Ophthalmology;  Laterality: Left;  . COLONOSCOPY WITH PROPOFOL N/A 08/21/2017   Procedure: COLONOSCOPY WITH PROPOFOL;  Surgeon: Manya Silvas, MD;  Location: [facility] ENDOSCOPY;  Service: Endoscopy;  Laterality: N/A;  . EYE SURGERY    . HERNIA REPAIR     inguinal and umbilical  . RIB RESECTION N/A 08/20/2019   Procedure: removal of xyphoid process;  Surgeon: Nestor Lewandowsky, MD;  Location: [facility] ORS;  Service: Thoracic;  Laterality: N/A;  . UMBILICAL HERNIA REPAIR N/A 06/22/2019   Procedure: OPEN HERNIA REPAIR UMBILICAL ADULT;  Surgeon:  Olean Ree, MD;  Location: [facility] ORS;  Service: General;  Laterality: N/A;  . UPPER GI ENDOSCOPY      Prior to Admission medications   Medication Sig Start Date End Date Taking? Authorizing Provider  ondansetron (ZOFRAN-ODT) 8 MG disintegrating tablet TAKE 1 TABLET BY MOUTH EVERY 8 HOURS AS NEEDED NAUSEA AND VOMITING 12/16/20   Trinna Post, PA-C  albuterol (VENTOLIN HFA) 108 (90 Base) MCG/ACT inhaler INHALE 2 PUFFS BY MOUTH EVERY 6 HOURS ASNEEDED WHEEZING/ SHORTNESS OF BREATH 01/09/21   Trinna Post, PA-C  ALPRAZolam Duanne Moron) 0.5 MG tablet Take 0.5 mg by mouth 4 (four)  times daily. Patient takes when wakes up(~0600)/ 1000/ 1600/ 2000 06/26/18   [provider]  Azelastine-Fluticasone 137-50 MCG/ACT SUSP USE 1 SPRAY INTO EACH NOSTRIL TWICE DAILY 01/04/21   Trinna Post, PA-C  carboxymethylcellulose (REFRESH PLUS) 0.5 % SOLN Place 2 drops into both eyes as needed (Dry eyes).    [provider]  Dulaglutide (TRULICITY) 1.5 YB/0.1BP SOPN Inject 1.5 mg into the skin once a week. 12/13/20   Trinna Post, PA-C  glucose blood test strip Use as instructed 12/17/19   Trinna Post, PA-C  guaiFENesin (MUCINEX) 600 MG 12 hr tablet Take 1 tablet (600 mg total) by mouth 2 (two) times daily. 11/16/20 05/15/21  Trinna Post, PA-C  Insulin Pen Needle (UNIFINE PENTIPS) 32G X 4 MM MISC USE WITH INSULIN ONCE DAILY 08/12/20   Trinna Post, PA-C  INVOKANA 300 MG TABS tablet TAKE 1 TABLET BY MOUTH ONCE DAILY BEFOREBREAFKAST 08/11/20   Trinna Post, PA-C  LANTUS SOLOSTAR 100 UNIT/ML Solostar Pen INJECT 10 UNITS SUBCUTANEOUSLY AT BEDTIME 12/07/20   Trinna Post, PA-C  metFORMIN (GLUCOPHAGE-XR) 500 MG 24 hr tablet TAKE 2 TABLETS BY MOUTH TWICE DAILY 01/18/21   Trinna Post, PA-C  Multiple Vitamins-Minerals (CENTRUM SILVER PO) Take 1 tablet by mouth daily.    [provider]  OLANZapine (ZYPREXA) 15 MG tablet Take 15 mg by mouth at bedtime. At 2000    [provider]  OneTouch Delica Lancets 10C MISC USE TO CHECK FASTING SUGAR TWICE DAILY 12/07/20   Trinna Post, PA-C  sertraline (ZOLOFT) 100 MG tablet Take 100 mg by mouth daily.     [provider]  simvastatin (ZOCOR) 80 MG tablet TAKE 1 TABLET BY MOUTH AT BEDTIME 08/11/20   Trinna Post, PA-C  SPIRIVA RESPIMAT 2.5 MCG/ACT AERS INHALE 2 PUFFS BY MOUTH ONCE DAILY 01/07/21   Trinna Post, PA-C    Family History  Problem Relation Age of Onset  . Hyperlipidemia Mother   . Hypertension Father   . Dementia Maternal Grandmother   . Heart disease Maternal  Grandfather   . Heart disease Paternal Grandmother   . Stroke Paternal Grandfather   . Diabetes Paternal Grandfather   . Diabetes Maternal Aunt      Social History   Tobacco Use  . Smoking status: Current Every Day Smoker    Packs/day: 1.50    Years: 30.00    Pack years: 45.00    Types: Cigarettes  . Smokeless tobacco: Former Systems developer    Types: Snuff  Vaping Use  . Vaping Use: Former  Substance Use Topics  . Alcohol use: Yes    Alcohol/week: 6.0 standard drinks    Types: 6 Cans of beer per week    Comment: 3 beers maybe 2x a week  . Drug use: Not Currently    Allergies as  of 01/24/2021 - Review Complete 12/13/2020  Allergen Reaction Noted  . Fentanyl Nausea And Vomiting 01/12/2019  . Morphine and related Shortness Of Breath 06/02/2015  . Klonopin [clonazepam] Other (See Comments) 06/02/2015  . Pimozide Other (See Comments) 08/20/2017  . Stelazine [trifluoperazine] Other (See Comments) 06/02/2015  . Azithromycin Other (See Comments) 12/10/2019  . Montelukast  07/05/2020  . Tramadol Nausea Only 09/30/2019    Review of Systems:    All systems reviewed and negative except where noted in HPI.   Physical Exam:  There were no vitals taken for this visit. No LMP for male patient. General:   Alert,  Well-developed, well-nourished, pleasant and cooperative in NAD Head:  Normocephalic and atraumatic. Eyes:  Sclera clear, no icterus.   Conjunctiva pink. Ears:  Normal auditory acuity. Neck:  Supple; no masses or thyromegaly. Lungs:  Respirations even and unlabored.  Clear throughout to auscultation.   No wheezes, crackles, or rhonchi. No acute distress. Heart:  Regular rate and rhythm; no murmurs, clicks, rubs, or gallops. Abdomen:  Normal bowel sounds.  No bruits.  Soft, non-tender and non-distended without masses, hepatosplenomegaly or hernias noted.  No guarding or rebound tenderness.  Negative Carnett sign.   Rectal:  Deferred.  Pulses:  Normal pulses noted. Extremities:  No  clubbing or edema.  No cyanosis. Neurologic:  Alert and oriented x3;  grossly normal neurologically. Skin:  Intact without significant lesions or rashes.  No jaundice. Lymph Nodes:  No significant cervical adenopathy. Psych:  Alert and cooperative. Normal mood and affect.  Imaging Studies: No results found.  Assessment and Plan:   JAKOLBY SEDIVY is a 55 y.o. y/o male who comes in today with a history of recurrent pancreatitis with alcohol likely the major contributing factor.  The patient did have imaging that showed possible sludge in the common bile duct and had abnormal liver enzymes and at that time he was seen by Dr. Vicente Males who suggested it may be a contributing factor.  The patient has not had any further bouts of pancreatitis since 2019.  He does have intermittent abdominal pain which are more likely result of gastroparesis since it only happens intermittently and only last a short time in addition to him feeling that he is bloated and having vomiting up of undigested food many hours after he eats.  The patient will not be started on any medication for this at this time because it happens infrequently and he has been told to try and not eat too close to going to bed.  The patient will be contacted next year for a repeat colonoscopy due to his history of polyps and has been told that if he does not hear from [place] he should contact us for a repeat colonoscopy.  The patient has been explained the plan and agrees with it.    [name], MD. Marval Regal    Note: This dictation was prepared with Dragon dictation along with smaller phrase technology. Any transcriptional errors that result from this process are unintentional.

## 2021-01-27 ENCOUNTER — Other Ambulatory Visit: Payer: Self-pay | Admitting: Physician Assistant

## 2021-01-27 DIAGNOSIS — E1165 Type 2 diabetes mellitus with hyperglycemia: Secondary | ICD-10-CM

## 2021-02-08 ENCOUNTER — Other Ambulatory Visit: Payer: Self-pay | Admitting: Physician Assistant

## 2021-02-08 DIAGNOSIS — E1165 Type 2 diabetes mellitus with hyperglycemia: Secondary | ICD-10-CM

## 2021-02-08 MED ORDER — UNIFINE PENTIPS 32G X 4 MM MISC: 1 refills | Status: DC

## 2021-02-14 ENCOUNTER — Other Ambulatory Visit: Payer: Self-pay | Admitting: Physician Assistant

## 2021-02-14 DIAGNOSIS — E1169 Type 2 diabetes mellitus with other specified complication: Secondary | ICD-10-CM

## 2021-02-23 ENCOUNTER — Telehealth: Payer: Self-pay | Admitting: Physician Assistant

## 2021-02-23 DIAGNOSIS — J449 Chronic obstructive pulmonary disease, unspecified: Secondary | ICD-10-CM

## 2021-02-23 NOTE — Telephone Encounter (Signed)
Pt is calling to report that the [place] 2.5 MCG/ACT AERS [[id number]]  Is not working - And in the past he has had COPD exhaburastion. Pt is requesting nebulizer inhaler to help with the coughing. Please advise Cb- [phone]

## 2021-02-23 NOTE — Telephone Encounter (Signed)
Patient requesting a nebulizer and medication for nebulizer to be prescribed for him. Patient reports cough is persistent day and night, reports no improvement on Spiriva. Patient reports using Albuterol inhaler daily every 4-6 hours, reports no cough control. Patient reports that in the past he has been treated with nebulizer in the hospital and that helped with cough. Please advise.

## 2021-02-24 NOTE — Telephone Encounter (Signed)
Patient was advised and urgent referral has been placed. Patient denies symptoms of dyspnea he states that he more so has a cough and has been taking otc Mucinex.KW

## 2021-02-24 NOTE — Telephone Encounter (Signed)
He needs referral to pulmonary for COPD. They can get him nebulizer if he qualifies.

## 2021-02-27 ENCOUNTER — Telehealth: Payer: Self-pay | Admitting: Family Medicine

## 2021-02-27 DIAGNOSIS — E1165 Type 2 diabetes mellitus with hyperglycemia: Secondary | ICD-10-CM

## 2021-02-27 MED ORDER — TRULICITY 1.5 MG/0.5ML ~~LOC~~ SOAJ: 1.5000 mg | SUBCUTANEOUS | 3 refills | Status: DC

## 2021-02-27 NOTE — Telephone Encounter (Addendum)
[facility] faxed refill request for the following medications:  1. Dulaglutide (TRULICITY) 1.5 TG/2.5WL SOPN  2. albuterol (VENTOLIN HFA) 108 (90 Base) MCG/ACT inhaler  3. SPIRIVA RESPIMAT 2.5 MCG/ACT AERS  LOV: 12/13/20 NOV: 06/08/21 with Dr. Jacinto Reap Please advise. Thanks TNP

## 2021-03-02 ENCOUNTER — Other Ambulatory Visit: Payer: Self-pay

## 2021-03-02 DIAGNOSIS — E1165 Type 2 diabetes mellitus with hyperglycemia: Secondary | ICD-10-CM

## 2021-03-02 DIAGNOSIS — J449 Chronic obstructive pulmonary disease, unspecified: Secondary | ICD-10-CM

## 2021-03-02 MED ORDER — TRULICITY 1.5 MG/0.5ML ~~LOC~~ SOAJ: 1.5000 mg | SUBCUTANEOUS | 3 refills | Status: DC

## 2021-03-02 MED ORDER — ALBUTEROL SULFATE HFA 108 (90 BASE) MCG/ACT IN AERS: INHALATION_SPRAY | RESPIRATORY_TRACT | 1 refills | Status: DC

## 2021-03-02 MED ORDER — SPIRIVA RESPIMAT 2.5 MCG/ACT IN AERS: INHALATION_SPRAY | RESPIRATORY_TRACT | 1 refills | Status: DC

## 2021-04-04 DIAGNOSIS — R42 Dizziness and giddiness: Secondary | ICD-10-CM | POA: Diagnosis not present

## 2021-04-04 DIAGNOSIS — S069X0S Unspecified intracranial injury without loss of consciousness, sequela: Secondary | ICD-10-CM | POA: Diagnosis not present

## 2021-04-04 DIAGNOSIS — G9389 Other specified disorders of brain: Secondary | ICD-10-CM | POA: Diagnosis not present

## 2021-04-04 DIAGNOSIS — H9319 Tinnitus, unspecified ear: Secondary | ICD-10-CM | POA: Diagnosis not present

## 2021-04-04 DIAGNOSIS — R2689 Other abnormalities of gait and mobility: Secondary | ICD-10-CM | POA: Diagnosis not present

## 2021-04-04 DIAGNOSIS — R413 Other amnesia: Secondary | ICD-10-CM | POA: Diagnosis not present

## 2021-04-04 DIAGNOSIS — G44309 Post-traumatic headache, unspecified, not intractable: Secondary | ICD-10-CM | POA: Diagnosis not present

## 2021-04-11 ENCOUNTER — Other Ambulatory Visit (HOSPITAL_COMMUNITY): Payer: Self-pay | Admitting: Physical Medicine and Rehabilitation

## 2021-04-11 ENCOUNTER — Other Ambulatory Visit: Payer: Self-pay | Admitting: Physical Medicine and Rehabilitation

## 2021-04-11 DIAGNOSIS — S069X0A Unspecified intracranial injury without loss of consciousness, initial encounter: Secondary | ICD-10-CM

## 2021-04-12 ENCOUNTER — Telehealth: Payer: Self-pay

## 2021-04-12 DIAGNOSIS — J309 Allergic rhinitis, unspecified: Secondary | ICD-10-CM

## 2021-04-12 MED ORDER — AZELASTINE-FLUTICASONE 137-50 MCG/ACT NA SUSP: NASAL | 2 refills | Status: DC

## 2021-04-12 NOTE — Telephone Encounter (Signed)
[facility] is asking for refills on Azelastine-Fluticasone 137-50 mcg

## 2021-04-14 ENCOUNTER — Encounter: Payer: Self-pay | Admitting: Physician Assistant

## 2021-04-18 ENCOUNTER — Other Ambulatory Visit: Payer: Self-pay | Admitting: Family Medicine

## 2021-04-18 DIAGNOSIS — J449 Chronic obstructive pulmonary disease, unspecified: Secondary | ICD-10-CM

## 2021-04-22 ENCOUNTER — Ambulatory Visit
Admission: RE | Admit: 2021-04-22 | Discharge: 2021-04-22 | Disposition: A | Discharge status: Home / Self Care | Payer: Medicare Other | Source: Ambulatory Visit | Attending: Physical Medicine and Rehabilitation | Admitting: Physical Medicine and Rehabilitation

## 2021-04-22 ENCOUNTER — Other Ambulatory Visit: Payer: Self-pay

## 2021-04-22 DIAGNOSIS — S069X0A Unspecified intracranial injury without loss of consciousness, initial encounter: Secondary | ICD-10-CM | POA: Diagnosis not present

## 2021-04-22 DIAGNOSIS — G9389 Other specified disorders of brain: Secondary | ICD-10-CM | POA: Insufficient documentation

## 2021-04-24 ENCOUNTER — Telehealth: Payer: Self-pay | Admitting: Family Medicine

## 2021-04-24 DIAGNOSIS — E1165 Type 2 diabetes mellitus with hyperglycemia: Secondary | ICD-10-CM

## 2021-04-24 DIAGNOSIS — S06300A Unspecified focal traumatic brain injury without loss of consciousness, initial encounter: Secondary | ICD-10-CM | POA: Diagnosis not present

## 2021-04-24 MED ORDER — GADOBUTROL 1 MMOL/ML IV SOLN
8.0000 mL | Freq: Once | INTRAVENOUS | Status: AC | PRN
  Administered 2021-04-24: 8 mL via INTRAVENOUS

## 2021-04-24 MED ORDER — METFORMIN HCL ER 500 MG PO TB24: 1000.0000 mg | ORAL_TABLET | Freq: Two times a day (BID) | ORAL | 0 refills | Status: DC

## 2021-04-24 NOTE — Telephone Encounter (Signed)
[facility] faxed refill request for the following medications:  metFORMIN (GLUCOPHAGE-XR) 500 MG 24 hr tablet  Last Rx: 01/18/21 Qty: 360 Refills: 0 LOV: 12/13/20 NOV: 06/08/21 Please advise. Thanks TNP

## 2021-04-25 ENCOUNTER — Other Ambulatory Visit: Payer: Self-pay | Admitting: Family Medicine

## 2021-04-25 DIAGNOSIS — E1165 Type 2 diabetes mellitus with hyperglycemia: Secondary | ICD-10-CM

## 2021-04-25 NOTE — Telephone Encounter (Signed)
Future visit in 1 month  

## 2021-05-17 ENCOUNTER — Other Ambulatory Visit: Payer: Self-pay | Admitting: Family Medicine

## 2021-05-17 DIAGNOSIS — E785 Hyperlipidemia, unspecified: Secondary | ICD-10-CM

## 2021-05-17 DIAGNOSIS — E1169 Type 2 diabetes mellitus with other specified complication: Secondary | ICD-10-CM

## 2021-05-18 ENCOUNTER — Other Ambulatory Visit: Payer: Self-pay

## 2021-05-18 ENCOUNTER — Encounter: Payer: Self-pay | Admitting: Pulmonary Disease

## 2021-05-18 ENCOUNTER — Ambulatory Visit
Admission: RE | Admit: 2021-05-18 | Discharge: 2021-05-18 | Disposition: A | Discharge status: Home / Self Care | Payer: Medicare Other | Attending: Pulmonary Disease | Admitting: Pulmonary Disease

## 2021-05-18 ENCOUNTER — Ambulatory Visit (INDEPENDENT_AMBULATORY_CARE_PROVIDER_SITE_OTHER): Payer: Medicare Other | Admitting: Pulmonary Disease

## 2021-05-18 ENCOUNTER — Ambulatory Visit
Admission: RE | Admit: 2021-05-18 | Discharge: 2021-05-18 | Disposition: A | Discharge status: Home / Self Care | Payer: Medicare Other | Source: Ambulatory Visit | Attending: Pulmonary Disease | Admitting: Pulmonary Disease

## 2021-05-18 VITALS — BP 114/72 | HR 70 | Temp 98.0°F | Ht 68.0 in | Wt 178.8 lb

## 2021-05-18 DIAGNOSIS — R053 Chronic cough: Secondary | ICD-10-CM

## 2021-05-18 DIAGNOSIS — J449 Chronic obstructive pulmonary disease, unspecified: Secondary | ICD-10-CM | POA: Diagnosis not present

## 2021-05-18 DIAGNOSIS — R059 Cough, unspecified: Secondary | ICD-10-CM | POA: Diagnosis not present

## 2021-05-18 DIAGNOSIS — J439 Emphysema, unspecified: Secondary | ICD-10-CM

## 2021-05-18 MED ORDER — ALBUTEROL SULFATE (2.5 MG/3ML) 0.083% IN NEBU: 2.5000 mg | INHALATION_SOLUTION | Freq: Four times a day (QID) | RESPIRATORY_TRACT | 5 refills | Status: DC | PRN

## 2021-05-18 MED ORDER — BUDESONIDE-FORMOTEROL FUMARATE 80-4.5 MCG/ACT IN AERO: 2.0000 | INHALATION_SPRAY | Freq: Two times a day (BID) | RESPIRATORY_TRACT | 12 refills | Status: DC

## 2021-05-18 NOTE — Progress Notes (Signed)
[facility]  Chief Complaint  Patient presents with   Consult    Over the counter cough syrup and mucinex dm for over a year with no improvement. Wheezing, sob every so  often.      Constitutional:  BP 114/72 (BP Location: Left Arm, Patient Position: Sitting, Cuff Size: Normal)   Pulse 70   Temp 98 F (36.7 C) (Oral)   Ht 5\' 8"  (1.727 m)   Wt 178 lb 12.8 oz (81.1 kg)   SpO2 97%   BMI 27.19 kg/m   Past Medical History:  ETOH, Allergies, Anxiety, Pancreatitis complicated by ARDS and PE in 2003, Bipolar, DM, Cataract, Depression, HLD, HTN, Schizoaffective  Past Surgical History:  He  has a past surgical history that includes Colonoscopy with propofol (N/A, 08/21/2017); Cataract extraction w/PHACO (Right, 03/26/2018); Cataract extraction w/PHACO (Left, 04/23/2018); Eye surgery; Umbilical hernia repair (N/A, 06/22/2019); Upper gi endoscopy; Hernia repair; and Rib resection (N/A, 08/20/2019).  Brief Summary:  Shaun Odonnell is a 55 y.o. male smoker with cough.       Subjective:   He is from [place].  Worked in Science writer, but currently unemployed.  Had pancreatitis with ARDS in 2003 and had PE then.  Had pneumonia several years later.  He has smoked for about 35 years and smokes 1.5 packs per day.  Tried nicotine patch, but caused funny dreams.    He was told he has COPD.  Previous CT chest showed changes of emphysema.  He has cough on a daily basis, especially in the morning.  Brings up clear to cloudy sputum.  Denies hemoptysis.  Intermittently gets episodes of chest congestion and wheezing with tightness.  He gets winded walking up stairs, but does okay on level ground.  Not having breathing issues while asleep.  No recent chest xray and doesn't recall doing a PFT before.  He is not aware of family history for lung disease.  She tried spiriva before but didn't help.  He used breo before, but doesn't remember the name and this caused him  to feel anxious.  He uses albuterol intermittently and this helps some.  Physical Exam:   Appearance - well kempt   ENMT - no sinus tenderness, no oral exudate, no LAN, Mallampati 3 airway, no stridor  Respiratory - equal breath sounds bilaterally, no wheezing or rales  CV - s1s2 regular rate and rhythm, no murmurs  Ext - no clubbing, no edema  Skin - no rashes  Psych - normal mood and affect   Pulmonary testing:    Chest Imaging:  CT angio chest 09/25/16 >> apical emphysema, fatty liver  Sleep Tests:    Cardiac Tests:  Echo 01/04/15 >> EF 55%, mild MR  Social History:  He  reports that he has been smoking cigarettes. He has a 51.00 pack-year smoking history. He has quit using smokeless tobacco.  His smokeless tobacco use included snuff. He reports current alcohol use of about 6.0 standard drinks of alcohol per week. He reports previous drug use.  Family History:  His family history includes Dementia in his maternal grandmother; Diabetes in his maternal aunt and paternal grandfather; Heart disease in his maternal grandfather and paternal grandmother; Hyperlipidemia in his mother; Hypertension in his father; Stroke in his paternal grandfather.     Assessment/Plan:   Chronic cough. - he has extensive history of smoking and CT imaging showed changes of emphysema; certainly could have COPD - spiriva was ineffective and breo  made him feel anxious - will have him try symbicort - will arrange for home nebulizer and have him use albuterol prn - will arrange for PFT and chest xray  Tobacco abuse. - asked him to check with behavioral health providers whether he could use chantix or buproprion to help with smoking cessation - he was previously intolerant of nicotine patch >> could try this again and start at lower dose - will need to discuss lung cancer screening at next visit if his chest xray is unrevealing  Time Spent Involved in Patient Care on Day of Examination:  48  minutes  Follow up:   Patient Instructions  Ask your providers in behavioral health whether you could use chantix or bupropion to help with smoking cessation  Will arrange for chest xray and pulmonary function test  Symbicort two puffs in the morning and two puffs in the evening, and rinse your mouth after each use  Will arrange for portable home nebulizer device and you can use albuterol every 6 hours as needed for cough, wheeze, or chest congestion  Follow up in 4 weeks with Dr. Halford Chessman or Nurse Practitioner  Medication List:   Allergies as of 05/18/2021       Reactions   Fentanyl Nausea And Vomiting   Other reaction(s): Unknown   Morphine And Related Shortness Of Breath   Clonazepam Other (See Comments)   Dystonic Reaction Other reaction(s): Other (See Comments) "Dystonic"; alprazolam is a home med "Dystonic"; alprazolam is a home med Dystonic Reaction   Pimozide Other (See Comments)   Dystonic Reaction Other reaction(s): Other (See Comments) "Distonic" "Distonic" Dystonic Reaction   Trifluoperazine Other (See Comments)   Dystonic Reaction Other reaction(s): Other (See Comments) "Distonic" "Distonic" Dystonic reaction Dystonic Reaction   Azithromycin Other (See Comments)   Was told not to take Z pak due to his heart   Montelukast    headache, nausea, feeling a little anxious, panic   Morphine Other (See Comments)   Other reaction(s): Other (See Comments) " I stop breathing"; can take Diluadid " I stop breathing"; can take Diluadid Cnnot tolerate d/t Drug Metabolism   Tramadol Nausea Only        Medication List        Accurate as of May 18, 2021 11:43 AM. If you have any questions, ask your nurse or doctor.          STOP taking these medications    Spiriva Respimat 2.5 MCG/ACT Aers Generic drug: Tiotropium Bromide Monohydrate Stopped by: Chesley Mires, MD       TAKE these medications    albuterol 108 (90 Base) MCG/ACT inhaler Commonly known  as: VENTOLIN HFA INHALE 2 PUFFS BY MOUTH EVERY 6 HOURS ASNEEDED WHEEZING/ SHORTNESS OF BREATH What changed: Another medication with the same name was added. Make sure you understand how and when to take each. Changed by: Chesley Mires, MD   albuterol (2.5 MG/3ML) 0.083% nebulizer solution Commonly known as: PROVENTIL Take 3 mLs (2.5 mg total) by nebulization every 6 (six) hours as needed for wheezing or shortness of breath. What changed: You were already taking a medication with the same name, and this prescription was added. Make sure you understand how and when to take each. Changed by: Chesley Mires, MD   ALPRAZolam 0.5 MG tablet Commonly known as: XANAX Take 0.5 mg by mouth 4 (four) times daily. Patient takes when wakes up(~0600)/ 1000/ 1600/ 2000   Azelastine-Fluticasone 137-50 MCG/ACT Susp USE 1 SPRAY INTO EACH NOSTRIL TWICE  DAILY   budesonide-formoterol 80-4.5 MCG/ACT inhaler Commonly known as: Symbicort Inhale 2 puffs into the lungs in the morning and at bedtime. Started by: Chesley Mires, MD   carboxymethylcellulose 0.5 % Soln Commonly known as: REFRESH PLUS Place 2 drops into both eyes as needed (Dry eyes).   carboxymethylcellulose 0.5 % Soln Commonly known as: REFRESH PLUS Apply to eye.   CENTRUM SILVER PO Take 1 tablet by mouth daily.   diclofenac Sodium 1 % Gel Commonly known as: VOLTAREN Apply topically.   glucose blood test strip Use as instructed   Invokana 300 MG Tabs tablet Generic drug: canagliflozin TAKE 1 TABLET BY MOUTH ONCE DAILY BEFOREBREAFKAST   Lantus SoloStar 100 UNIT/ML Solostar Pen Generic drug: insulin glargine INJECT 10 UNITS SUBCUTANEOUSLY AT BEDTIME   metFORMIN 500 MG 24 hr tablet Commonly known as: GLUCOPHAGE-XR Take 2 tablets (1,000 mg total) by mouth 2 (two) times daily.   OLANZapine 15 MG tablet Commonly known as: ZYPREXA Take 15 mg by mouth at bedtime. At 2000   OLANZapine 20 MG tablet Commonly known as: ZYPREXA   OLANZapine  20 MG tablet Commonly known as: ZYPREXA Take 20 mg by mouth at bedtime.   ondansetron 8 MG disintegrating tablet Commonly known as: ZOFRAN-ODT TAKE 1 TABLET BY MOUTH EVERY 8 HOURS AS NEEDED NAUSEA AND VOMITING   OneTouch Delica Lancets 32Y Misc USE TO CHECK FASTING SUGAR TWICE DAILY   sertraline 100 MG tablet Commonly known as: ZOLOFT Take 100 mg by mouth daily.   sertraline 100 MG tablet Commonly known as: ZOLOFT Take by mouth.   simvastatin 80 MG tablet Commonly known as: ZOCOR TAKE 1 TABLET BY MOUTH AT BEDTIME   Trulicity 1.5 EB/3.4DH Sopn Generic drug: Dulaglutide Inject 1.5 mg into the skin once a week.   Unifine Pentips 32G X 4 MM Misc Generic drug: Insulin Pen Needle USE WITH INSULIN ONCE DAILY        Signature:  Chesley Mires, MD [address] Pager - [phone] 05/18/2021, 11:43 AM

## 2021-05-18 NOTE — Patient Instructions (Signed)
Ask your providers in behavioral health whether you could use chantix or bupropion to help with smoking cessation  Will arrange for chest xray and pulmonary function test  Symbicort two puffs in the morning and two puffs in the evening, and rinse your mouth after each use  Will arrange for portable home nebulizer device and you can use albuterol every 6 hours as needed for cough, wheeze, or chest congestion  Follow up in 4 weeks with Dr. Halford Chessman or Nurse Practitioner

## 2021-05-29 ENCOUNTER — Telehealth: Payer: Self-pay | Admitting: Family Medicine

## 2021-05-29 ENCOUNTER — Other Ambulatory Visit: Payer: Self-pay

## 2021-05-29 DIAGNOSIS — E1165 Type 2 diabetes mellitus with hyperglycemia: Secondary | ICD-10-CM

## 2021-05-29 MED ORDER — LANTUS SOLOSTAR 100 UNIT/ML ~~LOC~~ SOPN: PEN_INJECTOR | SUBCUTANEOUS | 0 refills | Status: DC

## 2021-05-29 NOTE — Telephone Encounter (Signed)
[facility] faxed refill request for the following medications:  LANTUS SOLOSTAR 100 UNIT/ML Solostar Pen    Please advise.

## 2021-06-02 ENCOUNTER — Other Ambulatory Visit: Payer: Self-pay | Admitting: Family Medicine

## 2021-06-02 ENCOUNTER — Other Ambulatory Visit: Admission: RE | Admit: 2021-06-02 | Payer: Medicare Other | Source: Ambulatory Visit

## 2021-06-02 DIAGNOSIS — J309 Allergic rhinitis, unspecified: Secondary | ICD-10-CM

## 2021-06-02 NOTE — Telephone Encounter (Signed)
Future visit in 3 days  

## 2021-06-05 ENCOUNTER — Ambulatory Visit: Payer: Medicare Other

## 2021-06-05 ENCOUNTER — Ambulatory Visit (INDEPENDENT_AMBULATORY_CARE_PROVIDER_SITE_OTHER): Payer: Medicare Other | Admitting: Family Medicine

## 2021-06-05 ENCOUNTER — Encounter: Payer: Self-pay | Admitting: Family Medicine

## 2021-06-05 ENCOUNTER — Other Ambulatory Visit: Payer: Self-pay

## 2021-06-05 VITALS — BP 119/69 | HR 73 | Temp 99.0°F | Resp 16 | Ht 68.0 in | Wt 178.0 lb

## 2021-06-05 DIAGNOSIS — E663 Overweight: Secondary | ICD-10-CM

## 2021-06-05 DIAGNOSIS — F319 Bipolar disorder, unspecified: Secondary | ICD-10-CM | POA: Diagnosis not present

## 2021-06-05 DIAGNOSIS — Z0000 Encounter for general adult medical examination without abnormal findings: Secondary | ICD-10-CM

## 2021-06-05 DIAGNOSIS — R42 Dizziness and giddiness: Secondary | ICD-10-CM | POA: Insufficient documentation

## 2021-06-05 DIAGNOSIS — R002 Palpitations: Secondary | ICD-10-CM | POA: Insufficient documentation

## 2021-06-05 DIAGNOSIS — E785 Hyperlipidemia, unspecified: Secondary | ICD-10-CM

## 2021-06-05 DIAGNOSIS — E1169 Type 2 diabetes mellitus with other specified complication: Secondary | ICD-10-CM | POA: Diagnosis not present

## 2021-06-05 DIAGNOSIS — E1165 Type 2 diabetes mellitus with hyperglycemia: Secondary | ICD-10-CM

## 2021-06-05 DIAGNOSIS — J449 Chronic obstructive pulmonary disease, unspecified: Secondary | ICD-10-CM | POA: Diagnosis not present

## 2021-06-05 NOTE — Assessment & Plan Note (Signed)
Chronic, well-controlled Continue Olanzapine & Zoloft

## 2021-06-05 NOTE — Assessment & Plan Note (Signed)
Chronic and stable Recheck lipid panel Continue simvastatin

## 2021-06-05 NOTE — Assessment & Plan Note (Signed)
Chronic and stable, followed by pulmonology Continue medications

## 2021-06-05 NOTE — Assessment & Plan Note (Addendum)
-   Chronic concern - Provided pt. with Zio patch today - Recheck TSH, CBC, CMP - Check ECG today

## 2021-06-05 NOTE — Progress Notes (Signed)
Annual Wellness Visit     Patient: Shaun Odonnell, Male    DOB: 1966-01-12, 55 y.o.   MRN: [id number] Visit Date: 06/05/2021  Today's Provider: Lavon Paganini, MD   Chief Complaint  Patient presents with   Annual Exam   Subjective    Shaun Odonnell is a 55 y.o. male who presents today for his Annual Wellness Visit. He reports consuming a general diet. The patient does not participate in regular exercise at present. He generally feels well. He reports sleeping well. He does have additional problems to discuss today.   HPI Palpitations - Pt. describes intermittent palpitations for "years" - Denies chest pain & SOB; had a negative stress test in 2015 - Reports increased caffeine intake & recent work-related stressors  Lightheadedness - Reports recent dizziness, states that he's had 2 falls in the past 6 months - Had a negative MRI/MRA in June 2022  Type 2 diabetes - Pt. reports that BG ranges 180s-220s at home - Pt. states that he drinks 3 mocha lattes and eats "lots of sweets"  - Eye exam scheduled in November - Denies polyuria & polydipsia, but endorses occasional numbness/tingling in bilateral hands  Hyperlipidemia - Currently on simvastatin, denies myalgias  COPD  - Followed by pulmonology - Feels that his cough has worsened since starting Symbicort, but experiences symptom improvement w/ nebulizer - Pt. has been taking OTC cough syrup & Mucinex DM w/ minimal relief  Bipolar disorder - Currently on Olanzapine & Zoloft - Denies recent mood changes  Medications: Outpatient Medications Prior to Visit  Medication Sig   albuterol (PROVENTIL) (2.5 MG/3ML) 0.083% nebulizer solution Take 3 mLs (2.5 mg total) by nebulization every 6 (six) hours as needed for wheezing or shortness of breath.   albuterol (VENTOLIN HFA) 108 (90 Base) MCG/ACT inhaler INHALE 2 PUFFS BY MOUTH EVERY 6 HOURS ASNEEDED WHEEZING/ SHORTNESS OF BREATH   ALPRAZolam (XANAX) 0.5 MG tablet Take  0.5 mg by mouth 4 (four) times daily. Patient takes when wakes up(~0600)/ 1000/ 1600/ 2000   Azelastine-Fluticasone 137-50 MCG/ACT SUSP USE 1 SPRAY INTO EACH NOSTRIL TWICE DAILY AS DIRECTED   budesonide-formoterol (SYMBICORT) 80-4.5 MCG/ACT inhaler Inhale 2 puffs into the lungs in the morning and at bedtime.   carboxymethylcellulose (REFRESH PLUS) 0.5 % SOLN Place 2 drops into both eyes as needed (Dry eyes).   carboxymethylcellulose (REFRESH PLUS) 0.5 % SOLN Apply to eye.   diclofenac Sodium (VOLTAREN) 1 % GEL Apply topically.   Dulaglutide (TRULICITY) 1.5 0000000 SOPN Inject 1.5 mg into the skin once a week.   glucose blood test strip Use as instructed   insulin glargine (LANTUS SOLOSTAR) 100 UNIT/ML Solostar Pen INJECT 10 UNITS SUBCUTANEOUSLY AT BEDTIME   Insulin Pen Needle (UNIFINE PENTIPS) 32G X 4 MM MISC USE WITH INSULIN ONCE DAILY   INVOKANA 300 MG TABS tablet TAKE 1 TABLET BY MOUTH ONCE DAILY BEFOREBREAFKAST   metFORMIN (GLUCOPHAGE-XR) 500 MG 24 hr tablet Take 2 tablets (1,000 mg total) by mouth 2 (two) times daily.   Multiple Vitamins-Minerals (CENTRUM SILVER PO) Take 1 tablet by mouth daily.   OLANZapine (ZYPREXA) 15 MG tablet Take 15 mg by mouth at bedtime. At 2000   OLANZapine (ZYPREXA) 20 MG tablet    OLANZapine (ZYPREXA) 20 MG tablet Take 20 mg by mouth at bedtime.   ondansetron (ZOFRAN-ODT) 8 MG disintegrating tablet TAKE 1 TABLET BY MOUTH EVERY 8 HOURS AS NEEDED NAUSEA AND VOMITING   OneTouch Delica Lancets 99991111 MISC USE TO  CHECK FASTING SUGAR TWICE DAILY   sertraline (ZOLOFT) 100 MG tablet Take 100 mg by mouth daily.    sertraline (ZOLOFT) 100 MG tablet Take by mouth.   simvastatin (ZOCOR) 80 MG tablet TAKE 1 TABLET BY MOUTH AT BEDTIME   No facility-administered medications prior to visit.    Allergies  Allergen Reactions   Fentanyl Nausea And Vomiting    Other reaction(s): Unknown   Morphine And Related Shortness Of Breath   Clonazepam Other (See Comments)    Dystonic  Reaction   Other reaction(s): Other (See Comments) "Dystonic"; alprazolam is a home med "Dystonic"; alprazolam is a home med Dystonic Reaction   Pimozide Other (See Comments)    Dystonic Reaction  Other reaction(s): Other (See Comments) "Distonic" "Distonic" Dystonic Reaction   Trifluoperazine Other (See Comments)    Dystonic Reaction Other reaction(s): Other (See Comments) "Distonic" "Distonic" Dystonic reaction Dystonic Reaction   Azithromycin Other (See Comments)    Was told not to take Z pak due to his heart   Montelukast     headache, nausea, feeling a little anxious, panic   Morphine Other (See Comments)    Other reaction(s): Other (See Comments) " I stop breathing"; can take Diluadid " I stop breathing"; can take Diluadid Cnnot tolerate d/t Drug Metabolism   Tramadol Nausea Only    Patient Care Team: Gwyneth Sprout, FNP as PCP - General (Family Medicine) Leandrew Koyanagi, MD as Referring Physician (Ophthalmology)  Review of Systems  Constitutional:  Negative for activity change, appetite change, chills and fever.  HENT: Negative.    Eyes: Negative.  Negative for visual disturbance.  Respiratory:  Positive for cough. Negative for shortness of breath.   Cardiovascular:  Positive for palpitations. Negative for chest pain and leg swelling.  Gastrointestinal: Negative.   Endocrine: Positive for polyphagia. Negative for polydipsia and polyuria.  Genitourinary: Negative.   Musculoskeletal: Negative.  Negative for myalgias.  Skin: Negative.   Neurological:  Positive for light-headedness.  Hematological: Negative.   Psychiatric/Behavioral: Negative.          Objective    Vitals: BP 119/69   Pulse 73   Temp 99 F (37.2 C)   Resp 16   Ht '5\' 8"'$  (1.727 m)   Wt 178 lb (80.7 kg)   BMI 27.06 kg/m   Physical Exam Constitutional:      General: He is not in acute distress.    Appearance: Normal appearance.  HENT:     Head: Normocephalic and atraumatic.      Right Ear: External ear normal.     Left Ear: External ear normal.  Eyes:     Conjunctiva/sclera: Conjunctivae normal.  Cardiovascular:     Rate and Rhythm: Normal rate and regular rhythm.     Pulses: Normal pulses.     Heart sounds: Normal heart sounds.  Pulmonary:     Effort: Pulmonary effort is normal.     Breath sounds: Normal breath sounds.  Abdominal:     General: Bowel sounds are normal.     Palpations: Abdomen is soft. There is no mass.     Hernia: No hernia is present.  Musculoskeletal:        General: No swelling. Normal range of motion.  Skin:    General: Skin is warm and dry.  Neurological:     General: No focal deficit present.     Mental Status: He is alert. Mental status is at baseline.  Psychiatric:        Behavior:  Behavior normal.        Thought Content: Thought content normal.    Most recent functional status assessment: In your present state of health, do you have any difficulty performing the following activities: 12/13/2020  Hearing? N  Vision? Y  Comment -  Difficulty concentrating or making decisions? N  Walking or climbing stairs? N  Dressing or bathing? N  Doing errands, shopping? N  Preparing Food and eating ? -  Using the Toilet? -  In the past six months, have you accidently leaked urine? -  Comment -  Do you have problems with loss of bowel control? -  Comment -  Managing your Medications? -  Managing your Finances? -  Housekeeping or managing your Housekeeping? -  Some recent data might be hidden   Most recent fall risk assessment: Fall Risk  12/13/2020  Falls in the past year? 1  Number falls in past yr: 0  Injury with Fall? 1  Comment -  Risk for fall due to : History of fall(s)  Follow up Falls evaluation completed;Education provided;Falls prevention discussed    Most recent depression screenings: PHQ 2/9 Scores 12/13/2020 09/12/2020  PHQ - 2 Score 2 1  PHQ- 9 Score 2 2   Most recent cognitive screening: No flowsheet data  found. Most recent Audit-C alcohol use screening Alcohol Use Disorder Test (AUDIT) 12/13/2020  1. How often do you have a drink containing alcohol? 3  2. How many drinks containing alcohol do you have on a typical day when you are drinking? 1  3. How often do you have six or more drinks on one occasion? 1  AUDIT-C Score 5  4. How often during the last year have you found that you were not able to stop drinking once you had started? 0  5. How often during the last year have you failed to do what was normally expected from you because of drinking? 0  6. How often during the last year have you needed a first drink in the morning to get yourself going after a heavy drinking session? 0  7. How often during the last year have you had a feeling of guilt of remorse after drinking? 0  8. How often during the last year have you been unable to remember what happened the night before because you had been drinking? 0  9. Have you or someone else been injured as a result of your drinking? 0  10. Has a relative or friend or a doctor or another health worker been concerned about your drinking or suggested you cut down? 0  Alcohol Use Disorder Identification Test Final Score (AUDIT) 5  Alcohol Brief Interventions/Follow-up -   A score of 3 or more in women, and 4 or more in men indicates increased risk for alcohol abuse, EXCEPT if all of the points are from question 1   No results found for any visits on 06/05/21.  Assessment & Plan     Annual wellness visit done today including the all of the following: Reviewed patient's Family Medical History Reviewed and updated list of patient's medical providers Assessment of cognitive impairment was done Assessed patient's functional ability Established a written schedule for health screening [place] Completed and Reviewed  Exercise Activities and Dietary recommendations  Goals       "I want to know what my blood sugar is supposed to be  and learn how to make it better" (pt-stated)      Current Barriers:  Chronic Disease Management support and education needs related to Type II DM - patient calls today tfor assistance with DM management Reports fasting cbg = 240 this morning after having bologna/cheese sandwich for supper last night at 5:30pm and "Pure protein" bar for 10pm snack.  Reports "seeing a halo around my feet when I was propped up watching TV" after supper/before evening snack. Took 4oz apple juice and visual disturbance resolved. No other associated symptom reported. Asks "should I take another basaglar shot if my sugar is low"  Nurse Case Manager Clinical Goal(s):  Over the next 30 days, patient will verbalize understanding of plan for long term self health management of DMII Over the next 30 days, patient will meet with RN Care Manager to address education and care management/care coordination needs related to DMII Over the next 14 days, patient will attend all scheduled medical appointments: Carles Collet PA 05/15/19 @ 2:40pm Over the next 30 days, patient will demonstrate improved health management independence as evidenced byadherence to recommend cbg self monitoring (daily each morning) and recording, improved adherence to recommended carb modified diet, verbalization of understanding of signs and symptoms of hypo and hyperglycemia  Interventions:  Reviewed diabetes medications and advised to NEVER adjust medications without direct supervision from provider Advised patient to continue monitoring and recording daily fasting (morning) cbg as advised by PCP Advised patient to continue food diary and cbg log Advised on carb modified diet including reduction of simple and processed carbohydrates, addition of non-starchy vegetables, reduction of added fats (likes to add "Velveeta Shells and Cheese" to several other dishes), decrease intake of sugary beverages Reviewed signs and symptoms of hypoglycemia and hyperglycemia  and when to call provider  Patient Self Care Activities:  Self administers medications as prescribed Attends all scheduled provider appointments Monitors and records CBG Keeps food diary Calls pharmacy for medication refills Performs ADL's independently Performs IADL's independently Calls provider office for new concerns or questions  Please see past updates related to this goal by clicking on the "Past Updates" button in the selected goal        Prevent falls      Recommend to remove any items from the home that may cause slips or trips.        Immunization History  Administered Date(s) Administered   Dtap, Unspecified 10/24/2011   Influenza Split 09/26/2015   Influenza,inj,Quad PF,6+ Mos 08/06/2019, 09/12/2020   Influenza-Unspecified 10/16/2010   PFIZER(Purple Top)SARS-COV-2 Vaccination 01/23/2020, 02/16/2020, 11/07/2020   Pneumococcal Polysaccharide-23 10/24/2012   Tdap 09/12/2020    Health Maintenance  Topic Date Due   Zoster Vaccines- Shingrix (1 of 2) Never done   COVID-19 Vaccine (4 - Booster for Pfizer series) 03/07/2021   URINE MICROALBUMIN  05/03/2021   OPHTHALMOLOGY EXAM  06/03/2021   INFLUENZA VACCINE  06/05/2021   Pneumococcal Vaccine 64-25 Years old (2 - PCV) 05/18/2022 (Originally 10/24/2013)   HEMOGLOBIN A1C  06/12/2021   FOOT EXAM  06/05/2022   COLONOSCOPY (Pts 45-62yr Insurance coverage will need to be confirmed)  08/22/2027   TETANUS/TDAP  09/12/2030   PNEUMOCOCCAL POLYSACCHARIDE VACCINE AGE 38-64 HIGH RISK  Completed   HIV Screening  Completed   HPV VACCINES  Aged Out     Discussed health benefits of physical activity, and encouraged him to engage in regular exercise appropriate for his age and condition.    Problem List Items Addressed This Visit       Respiratory   [place] pulmonary disease [place]    Chronic and stable,  followed by pulmonology Continue medications         Endocrine   Type 2 diabetes mellitus with  hyperglycemia, without long-term current use of insulin (HCC)    - Chronic, poorly controlled - Recheck A1c, urine microalbumin, CMP - Counseled pt. on importance of eating a healthy, low-carb diet and maintaining appropriate weight - Pt. voiced a goal of decreasing his mocha latte consumption to 1-2/day - Eye exam scheduled for Nov       Relevant Orders   Hemoglobin A1c   Comprehensive metabolic panel   Microalbumin, urine   Hyperlipidemia associated with type 2 diabetes mellitus (HCC)    Chronic and stable Recheck lipid panel Continue simvastatin       Relevant Orders   Comprehensive metabolic panel   Lipid panel     Other   Bipolar depression (HCC)    Chronic, well-controlled Continue Olanzapine & Zoloft       Palpitations    - Chronic concern - Provided pt. with Zio patch today - Recheck TSH, CBC, CMP - Check ECG today       Relevant Orders   LONG TERM MONITOR (3-14 DAYS)   TSH   CBC   EKG 12-Lead (Completed)   Dizziness    - Acute problem - Provided pt. with Zio patch today - Recheck TSH, CBC, CMP - Check ECG today       Relevant Orders   LONG TERM MONITOR (3-14 DAYS)   TSH   CBC   EKG 12-Lead (Completed)   Other Visit Diagnoses     Encounter for annual physical exam    -  Primary   Relevant Orders   Hemoglobin A1c   Comprehensive metabolic panel   Lipid panel   TSH   CBC   Overweight       - Chronic - Counseled pt. on importance of eating a healthy, low-carb diet and maintaining appropriate weight - Pt. voiced a goal of decreasing his mocha latte consumption to 1-2/day        Return in about 3 months (around 09/05/2021) for chronic disease f/u, With new PCP.       Percell Locus, MS3  Patient seen along with MS3 student Percell Locus. I personally evaluated this patient along with the student, and verified all aspects of the history, physical exam, and medical decision making as documented by the student. I agree with the  student's documentation and have made all necessary edits.  Janellie Tennison, Dionne Bucy, MD, MPH [facility]

## 2021-06-05 NOTE — Assessment & Plan Note (Addendum)
-   Chronic, poorly controlled - Recheck A1c, urine microalbumin, CMP - Counseled pt. on importance of eating a healthy, low-carb diet and maintaining appropriate weight - Pt. voiced a goal of decreasing his mocha latte consumption to 1-2/day - Eye exam scheduled for Nov

## 2021-06-05 NOTE — Assessment & Plan Note (Addendum)
-   Acute problem - Provided pt. with Zio patch today - Recheck TSH, CBC, CMP - Check ECG today

## 2021-06-06 DIAGNOSIS — Z0000 Encounter for general adult medical examination without abnormal findings: Secondary | ICD-10-CM | POA: Diagnosis not present

## 2021-06-06 DIAGNOSIS — E785 Hyperlipidemia, unspecified: Secondary | ICD-10-CM | POA: Diagnosis not present

## 2021-06-06 DIAGNOSIS — R002 Palpitations: Secondary | ICD-10-CM | POA: Diagnosis not present

## 2021-06-06 DIAGNOSIS — E1169 Type 2 diabetes mellitus with other specified complication: Secondary | ICD-10-CM | POA: Diagnosis not present

## 2021-06-06 DIAGNOSIS — E1165 Type 2 diabetes mellitus with hyperglycemia: Secondary | ICD-10-CM | POA: Diagnosis not present

## 2021-06-07 ENCOUNTER — Other Ambulatory Visit
Admission: RE | Admit: 2021-06-07 | Discharge: 2021-06-07 | Disposition: A | Discharge status: Home / Self Care | Payer: Medicare Other | Source: Ambulatory Visit | Attending: Pulmonary Disease | Admitting: Pulmonary Disease

## 2021-06-07 ENCOUNTER — Other Ambulatory Visit: Payer: Self-pay

## 2021-06-07 DIAGNOSIS — Z20822 Contact with and (suspected) exposure to covid-19: Secondary | ICD-10-CM | POA: Insufficient documentation

## 2021-06-07 DIAGNOSIS — Z01812 Encounter for preprocedural laboratory examination: Secondary | ICD-10-CM | POA: Insufficient documentation

## 2021-06-07 LAB — COMPREHENSIVE METABOLIC PANEL
ALT: 19 IU/L (ref 0–44)
AST: 16 IU/L (ref 0–40)
Albumin/Globulin Ratio: 2.2 (ref 1.2–2.2)
Albumin: 4.6 g/dL (ref 3.8–4.9)
Alkaline Phosphatase: 108 IU/L (ref 44–121)
BUN/Creatinine Ratio: 16 (ref 9–20)
BUN: 11 mg/dL (ref 6–24)
Bilirubin Total: 0.2 mg/dL (ref 0.0–1.2)
CO2: 19 mmol/L — ABNORMAL LOW (ref 20–29)
Calcium: 9.5 mg/dL (ref 8.7–10.2)
Chloride: 97 mmol/L (ref 96–106)
Creatinine, Ser: 0.69 mg/dL — ABNORMAL LOW (ref 0.76–1.27)
Globulin, Total: 2.1 g/dL (ref 1.5–4.5)
Glucose: 208 mg/dL — ABNORMAL HIGH (ref 65–99)
Potassium: 4.3 mmol/L (ref 3.5–5.2)
Sodium: 136 mmol/L (ref 134–144)
Total Protein: 6.7 g/dL (ref 6.0–8.5)
eGFR: 111 mL/min/{1.73_m2} (ref >59)

## 2021-06-07 LAB — CBC
Hematocrit: 48.3 % (ref 37.5–51.0)
Hemoglobin: 16.7 g/dL (ref 13.0–17.7)
MCH: 32.6 pg (ref 26.6–33.0)
MCHC: 34.6 g/dL (ref 31.5–35.7)
MCV: 94 fL (ref 79–97)
Platelets: 175 10*3/uL (ref 150–450)
RBC: 5.12 x10E6/uL (ref 4.14–5.80)
RDW: 12.2 % (ref 11.6–15.4)
WBC: 13.4 10*3/uL — ABNORMAL HIGH (ref 3.4–10.8)

## 2021-06-07 LAB — MICROALBUMIN, URINE: Microalbumin, Urine: 7.6 ug/mL

## 2021-06-07 LAB — TSH: TSH: 1.01 u[IU]/mL (ref 0.450–4.500)

## 2021-06-07 LAB — HEMOGLOBIN A1C
Est. average glucose Bld gHb Est-mCnc: 275 mg/dL
Hgb A1c MFr Bld: 11.2 % — ABNORMAL HIGH (ref 4.8–5.6)

## 2021-06-07 LAB — LIPID PANEL
Chol/HDL Ratio: 4.1 ratio (ref 0.0–5.0)
Cholesterol, Total: 153 mg/dL (ref 100–199)
HDL: 37 mg/dL — ABNORMAL LOW (ref >39)
LDL Chol Calc (NIH): 85 mg/dL (ref 0–99)
Triglycerides: 182 mg/dL — ABNORMAL HIGH (ref 0–149)
VLDL Cholesterol Cal: 31 mg/dL (ref 5–40)

## 2021-06-07 LAB — SARS CORONAVIRUS 2 (TAT 6-24 HRS): SARS Coronavirus 2: NEGATIVE

## 2021-06-08 ENCOUNTER — Encounter: Payer: Self-pay | Admitting: Family Medicine

## 2021-06-08 ENCOUNTER — Ambulatory Visit: Payer: Medicare Other | Attending: Pulmonary Disease

## 2021-06-08 DIAGNOSIS — R053 Chronic cough: Secondary | ICD-10-CM | POA: Diagnosis not present

## 2021-06-08 DIAGNOSIS — F17210 Nicotine dependence, cigarettes, uncomplicated: Secondary | ICD-10-CM | POA: Insufficient documentation

## 2021-06-08 MED ORDER — ALBUTEROL SULFATE (2.5 MG/3ML) 0.083% IN NEBU
2.5000 mg | INHALATION_SOLUTION | Freq: Once | RESPIRATORY_TRACT | Status: AC
  Administered 2021-06-08: 2.5 mg via RESPIRATORY_TRACT
  Filled 2021-06-08: qty 3

## 2021-06-09 ENCOUNTER — Telehealth: Payer: Self-pay

## 2021-06-09 NOTE — Telephone Encounter (Signed)
Copied from [place] [id number]. Topic: General - Inquiry >> Jun 08, 2021  3:35 PM Greggory Keen D wrote: Reason for CRM: Pt is calling in regards to his labs and if Dr. B is going to keep him on Trulicity.  He says if so he is due a shot tomorrow.  He does not have any trulicity left. Please advise [phone]

## 2021-06-12 MED ORDER — TRULICITY 3 MG/0.5ML ~~LOC~~ SOAJ: 3.0000 mg | SUBCUTANEOUS | 3 refills | Status: DC

## 2021-06-12 NOTE — Telephone Encounter (Signed)
Shaun Crews, MD  06/08/2021  2:14 PM EDT Back to Top     Stable labs, except for high A1c.  Recommend increasing Trulicity to '3mg'$  weekly(please send new Rx). Needs 36mf/u to re-eval  Called patient and advised of Dr. BCoralee Rudmessage, prescription will be sent into pharmacy. KW

## 2021-06-14 ENCOUNTER — Telehealth: Payer: Self-pay

## 2021-06-14 ENCOUNTER — Other Ambulatory Visit: Payer: Self-pay | Admitting: Family Medicine

## 2021-06-14 DIAGNOSIS — E1165 Type 2 diabetes mellitus with hyperglycemia: Secondary | ICD-10-CM

## 2021-06-14 NOTE — Telephone Encounter (Signed)
Patient would like a  follow up call today regarding message below. Patient would like referral sent to Dr. Ronnald Collum Endocrinologist phone # [phone]. Specialist is asking for the 2 most recent  office visit notes, most recent lab results, and written referral.

## 2021-06-14 NOTE — Telephone Encounter (Signed)
Called and spoke with patient for clarification. Patient states that he feels that he is on " to much diabetic medication." Patient reports that he has been suffering with dizziness, lightheaded, feeling faint, difficulty concentrating and pressure in his head. Patient states that his mother looked online and researched that common side effect of Metformin is dizziness. Reviewing over patients chart he was seen in office by Dr. Brita Romp on 06/05/21 and at visit we did address Dizziness, Zio patch was placed, ekg performed and labs ordered. Patient wants to know could Metformin or any of his other diabetic medications causing this symptom? Patient was advised that Dr. Brita Romp is out of office today, he request I forward message to Tally Joe and Dr. Brita Romp to review. KW

## 2021-06-14 NOTE — Telephone Encounter (Signed)
Copied from [place] [id number]. Topic: General - Other >> Jun 14, 2021  9:06 AM Tessa Lerner A wrote: Reason for CRM: Patient would like to be contacted regarding medication adjustments and the impact that their dizziness is causing with their job  The patient declined to schedule an appointment at the time of call but would like to speak via phone with a member of clinical staff  Please contact when possible

## 2021-06-15 ENCOUNTER — Other Ambulatory Visit: Payer: Self-pay | Admitting: Family Medicine

## 2021-06-15 ENCOUNTER — Telehealth: Payer: Self-pay

## 2021-06-15 DIAGNOSIS — R42 Dizziness and giddiness: Secondary | ICD-10-CM

## 2021-06-15 MED ORDER — MECLIZINE HCL 50 MG PO TABS: 50.0000 mg | ORAL_TABLET | Freq: Three times a day (TID) | ORAL | 0 refills | Status: DC | PRN

## 2021-06-15 NOTE — Telephone Encounter (Signed)
Patient was advised and understands, patient has already a scheduled follow up visit with Dr. Jacinto Reap on 09/14/21, patient encouraged to continue exercise and watch diet, he will keep a log of his blood sugar readings and will be calling our office back if he experiences high blood sugar readings. KW

## 2021-06-15 NOTE — Addendum Note (Signed)
Addended by: Minette Headland on: 06/15/2021 09:42 AM   Modules accepted: Orders

## 2021-06-15 NOTE — Telephone Encounter (Signed)
Pt calling this morning stating that he would like to see Dr. Renato Shin Endocronologist at [facility] [address].     Fax#  [phone]

## 2021-06-15 NOTE — Telephone Encounter (Signed)
Patient called and advised of the message below from Edison Nasuti, [place] noted 06/14/21, patient verbalized understanding. He says that he knows it's the Metformin. He says he is not able to drive with the dizziness and it's keeping him from going to work. He says this has been ongoing since around April when he was seeing [employer], PA-C. He says he has lightheadedness, dizziness, pressure/pounding in the head, heart palpitations. He says his blood sugars are running fasting in the low 200's and during the day. Last night he didn't take the 2nd dose of Metformin. Symptoms got worse during the night, blood sugar around 245 at 2230, so he took the Metformin at that time. Blood sugar this morning 221. He is wanting his medications adjusted by Elisa. He says he would like a call back to let him know her decision. Advised the referral has been placed and to call Dr. Cordelia Pen office to see when he can be scheduled. He is asking for a call back today with Elisa's recommendations. He says he really needs to go back to work.   Message from Celene Kras sent at 06/15/2021  8:45 AM EDT  Pt called yesterday regarding a medication reaction. He states that he is experiencing dizziness and is looking for clinical advice regarding the prescription, metformin. Please advise.     Gwyneth Sprout, FNP to [facility] Nurse   June 14, 2021 11:57 AM If we could get a trend of the patient's blood sugars that would be helpful; I don't believe that the metformin is the cause.   Near better analysis of symptoms to try to determine cause.   Encourage water- 8, 8 oz per day.  Adding additional water to make up for any drinks with caffeine.   Referral placed for endocrine for their recommendations.   Thanks

## 2021-06-15 NOTE — Telephone Encounter (Signed)
-----   Message from Virginia Crews, MD sent at 06/08/2021  2:14 PM EDT ----- Stable labs, except for high A1c.  Recommend increasing Trulicity to '3mg'$  weekly (please send new Rx). Needs 75mf/u to re-eval

## 2021-06-15 NOTE — Telephone Encounter (Signed)
Referral has been revised. KW

## 2021-06-15 NOTE — Telephone Encounter (Signed)
Patient was advised of Shaun Odonnell message below and states that he will choose to d/c Metformin. He states that continuing on Metformin is affecting his job and states that he cannot drive on medication. Patient states that he was previously seen by [name] and states they had discussed in past about increasing Lantus and he states at time he declined but now would like for provider to consider increasing Lantus since he will not be on Metformin to help control his blood sugar readings. Patient states that he will work on diet and exercise, he would also like to know what he can do to relieve his symptoms of side effects from being on Metformin? KW

## 2021-06-15 NOTE — Telephone Encounter (Signed)
This encounter was created in error - please disregard.

## 2021-06-15 NOTE — Telephone Encounter (Signed)
Called patient about his labs. Reports that someone called him yesterday and informed him. But that he didn't schedule his 3 months follow up because he was under the impression that he need it to follow up in 3 weeks?

## 2021-06-15 NOTE — Telephone Encounter (Signed)
Patient was advised of Shaun Odonnell message below, he wanted to know what are side effects of Meclizine? Patient also wanted to know if you were going to increase his Lantus? He states that he is concerned about blood sugar reading continue to climb and states that he feels something needs to be increased he states. Patient states that when he saw Adrianna last they had discussed increasing Lantus to 12units. KW

## 2021-06-16 ENCOUNTER — Other Ambulatory Visit: Payer: Self-pay | Admitting: Family Medicine

## 2021-06-16 ENCOUNTER — Telehealth: Payer: Self-pay

## 2021-06-16 DIAGNOSIS — R110 Nausea: Secondary | ICD-10-CM

## 2021-06-16 MED ORDER — ONDANSETRON 8 MG PO TBDP: 8.0000 mg | ORAL_TABLET | Freq: Three times a day (TID) | ORAL | 0 refills | Status: DC | PRN

## 2021-06-16 NOTE — Telephone Encounter (Signed)
Please review. KW 

## 2021-06-16 NOTE — Telephone Encounter (Signed)
[facility] faxed refill request for the following medications:  ondansetron (ZOFRAN-ODT) 8 MG disintegrating tablet   Please advise.

## 2021-06-21 ENCOUNTER — Ambulatory Visit: Payer: Medicare Other | Admitting: Primary Care

## 2021-06-25 ENCOUNTER — Other Ambulatory Visit: Payer: Self-pay | Admitting: Family Medicine

## 2021-06-25 DIAGNOSIS — R42 Dizziness and giddiness: Secondary | ICD-10-CM

## 2021-06-27 ENCOUNTER — Inpatient Hospital Stay
Admission: EM | Admit: 2021-06-27 | Discharge: 2021-06-27 | DRG: 440 | Discharge status: Against Medical Advice | Payer: Medicare Other | Attending: Internal Medicine | Admitting: Internal Medicine

## 2021-06-27 ENCOUNTER — Encounter: Payer: Self-pay | Admitting: Emergency Medicine

## 2021-06-27 ENCOUNTER — Emergency Department: Payer: Medicare Other

## 2021-06-27 ENCOUNTER — Other Ambulatory Visit: Payer: Self-pay

## 2021-06-27 DIAGNOSIS — Z794 Long term (current) use of insulin: Secondary | ICD-10-CM

## 2021-06-27 DIAGNOSIS — Z9842 Cataract extraction status, left eye: Secondary | ICD-10-CM

## 2021-06-27 DIAGNOSIS — Z5329 Procedure and treatment not carried out because of patient's decision for other reasons: Secondary | ICD-10-CM | POA: Diagnosis present

## 2021-06-27 DIAGNOSIS — Z823 Family history of stroke: Secondary | ICD-10-CM

## 2021-06-27 DIAGNOSIS — Z86711 Personal history of pulmonary embolism: Secondary | ICD-10-CM

## 2021-06-27 DIAGNOSIS — K859 Acute pancreatitis without necrosis or infection, unspecified: Secondary | ICD-10-CM | POA: Diagnosis not present

## 2021-06-27 DIAGNOSIS — Z79899 Other long term (current) drug therapy: Secondary | ICD-10-CM

## 2021-06-27 DIAGNOSIS — K861 Other chronic pancreatitis: Secondary | ICD-10-CM | POA: Diagnosis present

## 2021-06-27 DIAGNOSIS — F17210 Nicotine dependence, cigarettes, uncomplicated: Secondary | ICD-10-CM | POA: Diagnosis present

## 2021-06-27 DIAGNOSIS — Z20822 Contact with and (suspected) exposure to covid-19: Secondary | ICD-10-CM | POA: Diagnosis present

## 2021-06-27 DIAGNOSIS — E119 Type 2 diabetes mellitus without complications: Secondary | ICD-10-CM | POA: Diagnosis not present

## 2021-06-27 DIAGNOSIS — Z833 Family history of diabetes mellitus: Secondary | ICD-10-CM

## 2021-06-27 DIAGNOSIS — R109 Unspecified abdominal pain: Secondary | ICD-10-CM | POA: Diagnosis not present

## 2021-06-27 DIAGNOSIS — I10 Essential (primary) hypertension: Secondary | ICD-10-CM | POA: Diagnosis present

## 2021-06-27 DIAGNOSIS — Z8249 Family history of ischemic heart disease and other diseases of the circulatory system: Secondary | ICD-10-CM

## 2021-06-27 DIAGNOSIS — Z961 Presence of intraocular lens: Secondary | ICD-10-CM | POA: Diagnosis present

## 2021-06-27 DIAGNOSIS — E785 Hyperlipidemia, unspecified: Secondary | ICD-10-CM | POA: Diagnosis present

## 2021-06-27 DIAGNOSIS — K760 Fatty (change of) liver, not elsewhere classified: Secondary | ICD-10-CM | POA: Diagnosis not present

## 2021-06-27 DIAGNOSIS — Z9841 Cataract extraction status, right eye: Secondary | ICD-10-CM

## 2021-06-27 DIAGNOSIS — Z83438 Family history of other disorder of lipoprotein metabolism and other lipidemia: Secondary | ICD-10-CM | POA: Diagnosis not present

## 2021-06-27 DIAGNOSIS — Z7984 Long term (current) use of oral hypoglycemic drugs: Secondary | ICD-10-CM | POA: Diagnosis not present

## 2021-06-27 DIAGNOSIS — F259 Schizoaffective disorder, unspecified: Secondary | ICD-10-CM | POA: Diagnosis present

## 2021-06-27 DIAGNOSIS — J449 Chronic obstructive pulmonary disease, unspecified: Secondary | ICD-10-CM | POA: Diagnosis present

## 2021-06-27 LAB — CBC WITH DIFFERENTIAL/PLATELET
Abs Immature Granulocytes: 0.09 10*3/uL — ABNORMAL HIGH (ref 0.00–0.07)
Basophils Absolute: 0.1 10*3/uL (ref 0.0–0.1)
Basophils Relative: 1 %
Eosinophils Absolute: 0.2 10*3/uL (ref 0.0–0.5)
Eosinophils Relative: 2 %
HCT: 52 % (ref 39.0–52.0)
Hemoglobin: 18.2 g/dL — ABNORMAL HIGH (ref 13.0–17.0)
Immature Granulocytes: 1 %
Lymphocytes Relative: 20 %
Lymphs Abs: 2 10*3/uL (ref 0.7–4.0)
MCH: 34 pg (ref 26.0–34.0)
MCHC: 35 g/dL (ref 30.0–36.0)
MCV: 97 fL (ref 80.0–100.0)
Monocytes Absolute: 0.7 10*3/uL (ref 0.1–1.0)
Monocytes Relative: 7 %
Neutro Abs: 6.9 10*3/uL (ref 1.7–7.7)
Neutrophils Relative %: 69 %
Platelets: 167 10*3/uL (ref 150–400)
RBC: 5.36 MIL/uL (ref 4.22–5.81)
RDW: 13.2 % (ref 11.5–15.5)
WBC: 10 10*3/uL (ref 4.0–10.5)
nRBC: 0 % (ref 0.0–0.2)

## 2021-06-27 LAB — COMPREHENSIVE METABOLIC PANEL
ALT: 20 U/L (ref 0–44)
AST: 16 U/L (ref 15–41)
Albumin: 4.5 g/dL (ref 3.5–5.0)
Alkaline Phosphatase: 103 U/L (ref 38–126)
Anion gap: 11 (ref 5–15)
BUN: 14 mg/dL (ref 6–20)
CO2: 29 mmol/L (ref 22–32)
Calcium: 9.4 mg/dL (ref 8.9–10.3)
Chloride: 93 mmol/L — ABNORMAL LOW (ref 98–111)
Creatinine, Ser: 0.75 mg/dL (ref 0.61–1.24)
GFR, Estimated: >60 mL/min (ref >60)
Glucose, Bld: 331 mg/dL — ABNORMAL HIGH (ref 70–99)
Potassium: 4.1 mmol/L (ref 3.5–5.1)
Sodium: 133 mmol/L — ABNORMAL LOW (ref 135–145)
Total Bilirubin: 0.8 mg/dL (ref 0.3–1.2)
Total Protein: 7.4 g/dL (ref 6.5–8.1)

## 2021-06-27 LAB — URINALYSIS, ROUTINE W REFLEX MICROSCOPIC
Bacteria, UA: NONE SEEN
Bilirubin Urine: NEGATIVE
Glucose, UA: >=500 mg/dL — AB
Hgb urine dipstick: NEGATIVE
Ketones, ur: NEGATIVE mg/dL
Leukocytes,Ua: NEGATIVE
Nitrite: NEGATIVE
Protein, ur: NEGATIVE mg/dL
Specific Gravity, Urine: 1.032 — ABNORMAL HIGH (ref 1.005–1.030)
Squamous Epithelial / HPF: NONE SEEN (ref 0–5)
pH: 5 (ref 5.0–8.0)

## 2021-06-27 LAB — TROPONIN I (HIGH SENSITIVITY): Troponin I (High Sensitivity): 5 ng/L (ref <18)

## 2021-06-27 LAB — CBG MONITORING, ED: Glucose-Capillary: 134 mg/dL — ABNORMAL HIGH (ref 70–99)

## 2021-06-27 LAB — LIPASE, BLOOD: Lipase: 78 U/L — ABNORMAL HIGH (ref 11–51)

## 2021-06-27 LAB — RESP PANEL BY RT-PCR (FLU A&B, COVID) ARPGX2
Influenza A by PCR: NEGATIVE
Influenza B by PCR: NEGATIVE
SARS Coronavirus 2 by RT PCR: NEGATIVE

## 2021-06-27 MED ORDER — HYDROMORPHONE HCL 1 MG/ML IJ SOLN: 0.5000 mg | INTRAMUSCULAR | Status: DC | PRN

## 2021-06-27 MED ORDER — HYDROMORPHONE HCL 1 MG/ML IJ SOLN
1.0000 mg | Freq: Once | INTRAMUSCULAR | Status: AC
  Administered 2021-06-27: 1 mg via INTRAVENOUS
  Filled 2021-06-27: qty 1

## 2021-06-27 MED ORDER — LACTATED RINGERS IV BOLUS
1000.0000 mL | Freq: Once | INTRAVENOUS | Status: AC
  Administered 2021-06-27: 1000 mL via INTRAVENOUS

## 2021-06-27 MED ORDER — LACTATED RINGERS IV SOLN: INTRAVENOUS | Status: DC

## 2021-06-27 MED ORDER — HYDROMORPHONE HCL 1 MG/ML IJ SOLN
1.0000 mg | INTRAMUSCULAR | Status: DC | PRN
  Administered 2021-06-27: 1 mg via INTRAVENOUS
  Filled 2021-06-27: qty 1

## 2021-06-27 MED ORDER — HYDROMORPHONE HCL 1 MG/ML IJ SOLN
1.0000 mg | INTRAMUSCULAR | Status: DC | PRN
Start: 2021-06-27 — End: 2021-06-27

## 2021-06-27 MED ORDER — IOHEXOL 350 MG/ML SOLN
80.0000 mL | Freq: Once | INTRAVENOUS | Status: AC | PRN
  Administered 2021-06-27: 80 mL via INTRAVENOUS

## 2021-06-27 MED ORDER — ONDANSETRON HCL 4 MG/2ML IJ SOLN
4.0000 mg | Freq: Once | INTRAMUSCULAR | Status: AC
  Administered 2021-06-27: 4 mg via INTRAVENOUS
  Filled 2021-06-27: qty 2

## 2021-06-27 NOTE — ED Notes (Signed)
Called [facility] to initiate a possible transfer. Spoke with Junie Panning requesting a GI consult. Faxed facesheet and power shared the CT.

## 2021-06-27 NOTE — ED Notes (Signed)
Pt was updated on admission and bed status and pt stated to this RN "I think I am going to leave AMA" admitting MD aware.   Pt requested to go home with "dilaudid tablets".  Pt educated MD states no to this.   Pt educated on AMA and that he would need to sign the form and pt verbalized understanding.   VSS. NAD noted.

## 2021-06-27 NOTE — ED Notes (Signed)
Pt requested more pain medicine, pt educated it had only been 10 minutes since admin time and to please give it some more time and wait for CT, MD aware.

## 2021-06-27 NOTE — ED Triage Notes (Signed)
Patient ambulatory to triage with steady gait, without difficulty or distress noted; pt reports upper abd pain accomp by nausea that began this morning; st hx of same with pancreatitis

## 2021-06-27 NOTE — ED Notes (Addendum)
O2 sat 88% RA resting @ bedside. Pt placed on 2L [place].

## 2021-06-27 NOTE — ED Notes (Signed)
[facility] has declined patient due to diversion

## 2021-06-27 NOTE — ED Notes (Signed)
Pt presents to ED with c/o of upper ABD pain that started this morning and woke him from his sleep. Pt states to this RN "its my pancreas".   Pt states HX of pancreatitis. Pt states he used to be a heavy drinker but only drinks 1-2 beers a week and states his last drink was last weekend and denies issues after drinking. Pt states no concerns for food poisoning.   Pt states nausea from pain but no vomiting diarrhea or SOB or chest pain at this time.   Pt requests dilaudid for pain and reports "that's the only thing that works for me and states allergies to fentanyl and morphine". Pt is A&Ox4. VSS.

## 2021-06-27 NOTE — Consult Note (Signed)
Patient left AMA before being seen.

## 2021-06-27 NOTE — ED Notes (Signed)
Pt at CT

## 2021-06-27 NOTE — ED Notes (Signed)
Called [facility] to initiate a transfer to [place] for a GI issue. Per Marden Noble we need initiate via [facility] hospitalist and GI will consult from there.

## 2021-06-27 NOTE — ED Notes (Signed)
Pt left AMA and did sign AMA form. Pt was ambulatory with steady gait on D/C. Pt given risks and benefits of leaving AMA, pt verbalized understanding. IV removed.   Pt did and agree was educated on the need to come back for high fevers and intractable vomiting or pain. Pt educated to use OTC ibuprofen and tylenol for pain, pt verbalized understanding.

## 2021-06-27 NOTE — ED Provider Notes (Signed)
[facility] Emergency Department Provider Note   ____________________________________________   Event Date/Time   First MD Initiated Contact with Patient 06/27/21 [phone]     (approximate)  I have reviewed the triage vital signs and the nursing notes.   HISTORY  Chief Complaint Abdominal Pain    HPI Shaun Odonnell is a 55 y.o. male with past medical history of hypertension, hyperlipidemia, diabetes, COPD, recurrent pancreatitis, schizoaffective disorder who presents to the ED complaining of abdominal pain.  Patient reports that he was woken from sleep this morning by sharp and severe pain in his epigastric area.  He describes pain as constant and not exacerbated or alleviated by anything.  He states he has been feeling nauseous with dry heaves, but denies any vomiting or changes in his bowel movements.  He describes pain as similar to prior episodes of pancreatitis, states he was a heavy drinker in the past but recently only has a couple of beers per week.  He denies any fevers, cough, chest pain, shortness of breath, dysuria, or flank pain.  He has not take anything prior to arrival for his symptoms.        Past Medical History:  Diagnosis Date   Alcohol abuse 04/29/2019   Allergy    Anxiety    ARDS (adult respiratory distress syndrome) (HCC)    ARDS (adult respiratory distress syndrome) (HCC)    Bipolar disorder (HCC)    Borderline diabetes    Cataract    Chronic kidney disease    COPD (chronic obstructive pulmonary disease) (HCC)    chronic cough and wheezing   Depression    Diabetes mellitus without complication (HCC)    Dizziness    medication related   Dyspnea    with exertion   Elevated lipids    Fatty liver    Hypercholesterolemia    Hypertension    Pancreatitis    Pneumonia    in past   Pneumonia    in past   Pre-diabetes    diet controlled   Pulmonary embolism ([place])    Schizoaffective disorder ([place])     Patient Active  Problem List   Diagnosis Date Noted   Palpitations 06/05/2021   Dizziness 06/05/2021   Drug-seeking behavior 09/17/2019   Hyperlipidemia associated with type 2 diabetes mellitus ([place]) 09/17/2019   Xyphoidalgia    Type 2 diabetes mellitus with hyperglycemia, without long-term current use of insulin ([place]) 04/29/2019   Chronic obstructive pulmonary disease ([place]) 04/29/2019   Alcohol abuse 04/29/2019   Bipolar depression ([place]) [id number]   Umbilical hernia without obstruction and without gangrene 04/23/2019   Acute pancreatitis 12/31/2018   Pancreatitis 07/02/2018    Past Surgical History:  Procedure Laterality Date   CATARACT EXTRACTION W/PHACO Right 03/26/2018   Procedure: CATARACT EXTRACTION PHACO AND INTRAOCULAR LENS PLACEMENT ([place]) RIGHT BORDERLINE DIABETIC;  Surgeon: Leandrew Koyanagi, MD;  Location: [place];  Service: Ophthalmology;  Laterality: Right;   CATARACT EXTRACTION W/PHACO Left 04/23/2018   Procedure: CATARACT EXTRACTION PHACO AND INTRAOCULAR LENS PLACEMENT ([place])  LEFT BORDERLINE DIABETIC;  Surgeon: Leandrew Koyanagi, MD;  Location: [place];  Service: Ophthalmology;  Laterality: Left;   COLONOSCOPY WITH PROPOFOL N/A 08/21/2017   Procedure: COLONOSCOPY WITH PROPOFOL;  Surgeon: Manya Silvas, MD;  [facility];  Service: Endoscopy;  Laterality: N/A;   EYE SURGERY     HERNIA REPAIR     inguinal and umbilical   RIB RESECTION N/A 08/20/2019   Procedure: removal of xyphoid  process;  Surgeon: Nestor Lewandowsky, MD;  Location: [facility] ORS;  Service: Thoracic;  Laterality: N/A;   UMBILICAL HERNIA REPAIR N/A 06/22/2019   Procedure: OPEN HERNIA REPAIR UMBILICAL ADULT;  Surgeon: Olean Ree, MD;  Location: [facility] ORS;  Service: General;  Laterality: N/A;   UPPER GI ENDOSCOPY      Prior to Admission medications   Medication Sig Start Date End Date Taking? Authorizing Provider  albuterol (PROVENTIL) (2.5 MG/3ML) 0.083% nebulizer solution Take 3  mLs (2.5 mg total) by nebulization every 6 (six) hours as needed for wheezing or shortness of breath. 05/18/21   Chesley Mires, MD  albuterol (VENTOLIN HFA) 108 (90 Base) MCG/ACT inhaler INHALE 2 PUFFS BY MOUTH EVERY 6 HOURS ASNEEDED WHEEZING/ SHORTNESS OF BREATH 03/02/21   Birdie Sons, MD  ALPRAZolam Duanne Moron) 0.5 MG tablet Take 0.5 mg by mouth 4 (four) times daily. Patient takes when wakes up(~0600)/ 1000/ 1600/ 2000 06/26/18   [provider]  Azelastine-Fluticasone 137-50 MCG/ACT SUSP USE 1 SPRAY INTO EACH NOSTRIL TWICE DAILY AS DIRECTED 06/02/21   Jerrol Banana., MD  budesonide-formoterol [facility]) 80-4.5 MCG/ACT inhaler Inhale 2 puffs into the lungs in the morning and at bedtime. 05/18/21   Chesley Mires, MD  carboxymethylcellulose (REFRESH PLUS) 0.5 % SOLN Place 2 drops into both eyes as needed (Dry eyes).    [provider]  carboxymethylcellulose (REFRESH PLUS) 0.5 % SOLN Apply to eye.    [provider]  diclofenac Sodium (VOLTAREN) 1 % GEL Apply topically.    [provider]  Dulaglutide (TRULICITY) 3 0000000 SOPN Inject 3 mg as directed once a week. 06/12/21   Virginia Crews, MD  glucose blood test strip Use as instructed 12/17/19   Trinna Post, PA-C  insulin glargine (LANTUS SOLOSTAR) 100 UNIT/ML Solostar Pen INJECT 10 UNITS SUBCUTANEOUSLY AT BEDTIME 05/29/21   Virginia Crews, MD  Insulin Pen Needle (UNIFINE PENTIPS) 32G X 4 MM MISC USE WITH INSULIN ONCE DAILY 02/08/21   Mar Daring, PA-C  INVOKANA 300 MG TABS tablet TAKE 1 TABLET BY MOUTH ONCE DAILY BEFOREBREAFKAST 04/25/21   Chrismon, Vickki Muff, PA-C  meclizine (ANTIVERT) 25 MG tablet TAKE 2 TABLETS BY MOUTH 3 TIMES DAILY ASNEEDED 06/26/21   Gwyneth Sprout, FNP  metFORMIN (GLUCOPHAGE-XR) 500 MG 24 hr tablet Take 2 tablets (1,000 mg total) by mouth 2 (two) times daily. 04/24/21   Virginia Crews, MD  Multiple Vitamins-Minerals (CENTRUM SILVER PO) Take 1 tablet by mouth  daily.    [provider]  OLANZapine (ZYPREXA) 15 MG tablet Take 15 mg by mouth at bedtime. At 2000    [provider]  OLANZapine (ZYPREXA) 20 MG tablet  02/21/21   [provider]  OLANZapine (ZYPREXA) 20 MG tablet Take 20 mg by mouth at bedtime. 04/04/21   [provider]  ondansetron (ZOFRAN-ODT) 8 MG disintegrating tablet Take 1 tablet (8 mg total) by mouth every 8 (eight) hours as needed for nausea or vomiting. 06/16/21   Gwyneth Sprout, FNP  OneTouch Delica Lancets [id number] MISC USE TO CHECK FASTING SUGAR TWICE DAILY 12/07/20   Trinna Post, PA-C  sertraline (ZOLOFT) 100 MG tablet Take 100 mg by mouth daily.     [provider]  sertraline (ZOLOFT) 100 MG tablet Take by mouth.    [provider]  simvastatin (ZOCOR) 80 MG tablet TAKE 1 TABLET BY MOUTH AT BEDTIME 05/17/21   Virginia Crews, MD    Allergies Fentanyl, Morphine  and related, Clonazepam, Pimozide, Trifluoperazine, Azithromycin, Montelukast, Morphine, and Tramadol  Family History  Problem Relation Age of Onset   Hyperlipidemia Mother    Hypertension Father    Dementia Maternal Grandmother    Heart disease Maternal Grandfather    Heart disease Paternal Grandmother    Stroke Paternal Grandfather    Diabetes Paternal Grandfather    Diabetes Maternal Aunt     Social History Social History   Tobacco Use   Smoking status: Every Day    Packs/day: 1.50    Years: 34.00    Pack years: 51.00    Types: Cigarettes   Smokeless tobacco: Former    Types: Snuff  Vaping Use   Vaping Use: Former  Substance Use Topics   Alcohol use: Yes    Alcohol/week: 6.0 standard drinks    Types: 6 Cans of beer per week    Comment: 3 beers maybe 2x a week   Drug use: Not Currently    Review of Systems  Constitutional: No fever/chills Eyes: No visual changes. ENT: No sore throat. Cardiovascular: Denies chest pain. Respiratory: Denies shortness of breath. Gastrointestinal:  Positive for abdominal pain and nausea, no vomiting.  No diarrhea.  No constipation. Genitourinary: Negative for dysuria. Musculoskeletal: Negative for back pain. Skin: Negative for rash. Neurological: Negative for headaches, focal weakness or numbness.  ____________________________________________   PHYSICAL EXAM:  VITAL SIGNS: ED Triage Vitals  Enc Vitals Group     BP 06/27/21 0427 (!) 149/85     Pulse Rate 06/27/21 0427 (!) 54     Resp 06/27/21 0427 20     Temp 06/27/21 0427 97.9 F (36.6 C)     Temp Source 06/27/21 0427 Oral     SpO2 06/27/21 0427 97 %     Weight 06/27/21 0426 178 lb (80.7 kg)     Height 06/27/21 0426 '5\' 8"'$  (1.727 m)     Head Circumference --      Peak Flow --      Pain Score 06/27/21 0426 9     Pain Loc --      Pain Edu? --      Excl. in [place]? --     Constitutional: Alert and oriented. Eyes: Conjunctivae are normal. Head: Atraumatic. Nose: No congestion/rhinnorhea. Mouth/Throat: Mucous membranes are moist. Neck: Normal ROM Cardiovascular: Normal rate, regular rhythm. Grossly normal heart sounds. Respiratory: Normal respiratory effort.  No retractions. Lungs CTAB. Gastrointestinal: Soft and tender to palpation in the epigastrium with voluntary guarding. No distention. Genitourinary: deferred Musculoskeletal: No lower extremity tenderness nor edema. Neurologic:  Normal speech and language. No gross focal neurologic deficits are appreciated. Skin:  Skin is warm, dry and intact. No rash noted. Psychiatric: Mood and affect are normal. Speech and behavior are normal.  ____________________________________________   LABS (all labs ordered are listed, but only abnormal results are displayed)  Labs Reviewed  CBC WITH DIFFERENTIAL/PLATELET - Abnormal; Notable for the following components:      Result Value   Hemoglobin 18.2 (*)    Abs Immature Granulocytes 0.09 (*)    All other components within normal limits  COMPREHENSIVE METABOLIC PANEL - Abnormal;  Notable for the following components:   Sodium 133 (*)    Chloride 93 (*)    Glucose, Bld 331 (*)    All other components within normal limits  LIPASE, BLOOD - Abnormal; Notable for the following components:   Lipase 78 (*)    All other components within normal limits  URINALYSIS, ROUTINE W REFLEX  MICROSCOPIC - Abnormal; Notable for the following components:   Color, Urine YELLOW (*)    APPearance CLEAR (*)    Specific Gravity, Urine 1.032 (*)    Glucose, UA >=500 (*)    All other components within normal limits  CBG MONITORING, ED - Abnormal; Notable for the following components:   Glucose-Capillary 134 (*)    All other components within normal limits  RESP PANEL BY RT-PCR (FLU A&B, COVID) ARPGX2  TROPONIN I (HIGH SENSITIVITY)  TROPONIN I (HIGH SENSITIVITY)   ____________________________________________  EKG  ED ECG REPORT I, Blake Divine, the attending physician, personally viewed and interpreted this ECG.   Date: 06/27/2021  EKG Time: 4:31  Rate: 55  Rhythm: sinus bradycardia  Axis: Normal  Intervals:none  ST&T Change: None   PROCEDURES  Procedure(s) performed (including Critical Care):  Procedures   ____________________________________________   INITIAL IMPRESSION / ASSESSMENT AND PLAN / ED COURSE      55 year old male with past medical history of hypertension, hyperlipidemia, diabetes, COPD, recurrent pancreatitis, and schizoaffective disorder who presents to the ED for severe pain in his epigastrium starting this morning and associated with nausea.  Patient's pain is reproducible with palpation to his epigastrium, EKG shows no evidence of arrhythmia or ischemia and I doubt cardiac etiology.  Labs remarkable for mildly elevated lipase, LFTs within normal limits.  He may have chronic pancreatitis that limits any elevation in his lipase.  He does have a history of pancreatic necrosis and pseudocyst, we will further assess with CT scan.  We will treat  symptomatically with IV Dilaudid given he reports allergy to morphine and fentanyl, hydrate with IV fluids.  CT scan shows 6 mm stone at the pancreatic duct with proximal ductal dilatation.  Patient's previously seen pseudocyst also appears to be slightly enlarged compared to previous.  Findings discussed with Dr. Allen Norris of GI, who recommends discussion with GI service at tertiary hospital.  Case discussed with transfer center at [facility], however they do not currently have space available for patient.  Case discussed with [facility] team at [facility], NP Nevin Bloodgood reviewed findings with Dr. Rush Landmark, and they state that there is no indication for transfer as they would not plan for any inpatient intervention on pancreatic duct stone.  They state that patient may be admitted for supportive care for his pancreatitis, then follow-up as outpatient for consideration of ERCP. Plan to discuss with hospitalist for admission.      ____________________________________________   FINAL CLINICAL IMPRESSION(S) / ED DIAGNOSES  Final diagnoses:  Acute pancreatitis without infection or necrosis, unspecified pancreatitis type     ED Discharge Orders     None        Note:  This document was prepared using Dragon voice recognition software and may include unintentional dictation errors.    Blake Divine, MD 06/27/21 [phone]

## 2021-06-28 ENCOUNTER — Other Ambulatory Visit: Payer: Self-pay

## 2021-06-28 ENCOUNTER — Observation Stay
Admission: EM | Admit: 2021-06-28 | Discharge: 2021-06-29 | Disposition: A | Discharge status: Home / Self Care | Payer: Medicare Other | Attending: Internal Medicine | Admitting: Internal Medicine

## 2021-06-28 ENCOUNTER — Inpatient Hospital Stay: Payer: Medicare Other

## 2021-06-28 ENCOUNTER — Encounter: Payer: Self-pay | Admitting: Emergency Medicine

## 2021-06-28 DIAGNOSIS — E1122 Type 2 diabetes mellitus with diabetic chronic kidney disease: Secondary | ICD-10-CM | POA: Diagnosis not present

## 2021-06-28 DIAGNOSIS — K760 Fatty (change of) liver, not elsewhere classified: Secondary | ICD-10-CM | POA: Diagnosis not present

## 2021-06-28 DIAGNOSIS — F17210 Nicotine dependence, cigarettes, uncomplicated: Secondary | ICD-10-CM | POA: Diagnosis not present

## 2021-06-28 DIAGNOSIS — E785 Hyperlipidemia, unspecified: Secondary | ICD-10-CM

## 2021-06-28 DIAGNOSIS — Z720 Tobacco use: Secondary | ICD-10-CM | POA: Diagnosis not present

## 2021-06-28 DIAGNOSIS — K8581 Other acute pancreatitis with uninfected necrosis: Secondary | ICD-10-CM | POA: Diagnosis not present

## 2021-06-28 DIAGNOSIS — I10 Essential (primary) hypertension: Secondary | ICD-10-CM | POA: Diagnosis not present

## 2021-06-28 DIAGNOSIS — E1169 Type 2 diabetes mellitus with other specified complication: Secondary | ICD-10-CM | POA: Diagnosis not present

## 2021-06-28 DIAGNOSIS — R002 Palpitations: Secondary | ICD-10-CM | POA: Diagnosis not present

## 2021-06-28 DIAGNOSIS — Z79899 Other long term (current) drug therapy: Secondary | ICD-10-CM | POA: Insufficient documentation

## 2021-06-28 DIAGNOSIS — E781 Pure hyperglyceridemia: Secondary | ICD-10-CM | POA: Diagnosis present

## 2021-06-28 DIAGNOSIS — K859 Acute pancreatitis without necrosis or infection, unspecified: Secondary | ICD-10-CM | POA: Diagnosis not present

## 2021-06-28 DIAGNOSIS — Z20822 Contact with and (suspected) exposure to covid-19: Secondary | ICD-10-CM | POA: Diagnosis not present

## 2021-06-28 DIAGNOSIS — N189 Chronic kidney disease, unspecified: Secondary | ICD-10-CM | POA: Diagnosis not present

## 2021-06-28 DIAGNOSIS — E119 Type 2 diabetes mellitus without complications: Secondary | ICD-10-CM | POA: Diagnosis not present

## 2021-06-28 DIAGNOSIS — Z794 Long term (current) use of insulin: Secondary | ICD-10-CM | POA: Insufficient documentation

## 2021-06-28 DIAGNOSIS — I129 Hypertensive chronic kidney disease with stage 1 through stage 4 chronic kidney disease, or unspecified chronic kidney disease: Secondary | ICD-10-CM | POA: Diagnosis not present

## 2021-06-28 DIAGNOSIS — J449 Chronic obstructive pulmonary disease, unspecified: Secondary | ICD-10-CM | POA: Diagnosis not present

## 2021-06-28 DIAGNOSIS — K8689 Other specified diseases of pancreas: Secondary | ICD-10-CM | POA: Diagnosis present

## 2021-06-28 DIAGNOSIS — R739 Hyperglycemia, unspecified: Secondary | ICD-10-CM

## 2021-06-28 DIAGNOSIS — E1165 Type 2 diabetes mellitus with hyperglycemia: Secondary | ICD-10-CM | POA: Diagnosis not present

## 2021-06-28 DIAGNOSIS — R935 Abnormal findings on diagnostic imaging of other abdominal regions, including retroperitoneum: Secondary | ICD-10-CM | POA: Diagnosis not present

## 2021-06-28 DIAGNOSIS — F259 Schizoaffective disorder, unspecified: Secondary | ICD-10-CM | POA: Diagnosis present

## 2021-06-28 DIAGNOSIS — F319 Bipolar disorder, unspecified: Secondary | ICD-10-CM | POA: Diagnosis present

## 2021-06-28 DIAGNOSIS — Z7984 Long term (current) use of oral hypoglycemic drugs: Secondary | ICD-10-CM | POA: Diagnosis not present

## 2021-06-28 DIAGNOSIS — K863 Pseudocyst of pancreas: Secondary | ICD-10-CM | POA: Diagnosis not present

## 2021-06-28 DIAGNOSIS — R1010 Upper abdominal pain, unspecified: Secondary | ICD-10-CM | POA: Diagnosis present

## 2021-06-28 DIAGNOSIS — F1010 Alcohol abuse, uncomplicated: Secondary | ICD-10-CM | POA: Diagnosis not present

## 2021-06-28 LAB — CBC
HCT: 48.6 % (ref 39.0–52.0)
Hemoglobin: 17.3 g/dL — ABNORMAL HIGH (ref 13.0–17.0)
MCH: 34.5 pg — ABNORMAL HIGH (ref 26.0–34.0)
MCHC: 35.6 g/dL (ref 30.0–36.0)
MCV: 96.8 fL (ref 80.0–100.0)
Platelets: 150 10*3/uL (ref 150–400)
RBC: 5.02 MIL/uL (ref 4.22–5.81)
RDW: 13.2 % (ref 11.5–15.5)
WBC: 8.2 10*3/uL (ref 4.0–10.5)
nRBC: 0 % (ref 0.0–0.2)

## 2021-06-28 LAB — URINALYSIS, COMPLETE (UACMP) WITH MICROSCOPIC
Bacteria, UA: NONE SEEN
Bilirubin Urine: NEGATIVE
Glucose, UA: >=500 mg/dL — AB
Hgb urine dipstick: NEGATIVE
Ketones, ur: 5 mg/dL — AB
Leukocytes,Ua: NEGATIVE
Nitrite: NEGATIVE
Protein, ur: NEGATIVE mg/dL
Specific Gravity, Urine: 1.025 (ref 1.005–1.030)
Squamous Epithelial / HPF: NONE SEEN (ref 0–5)
pH: 6 (ref 5.0–8.0)

## 2021-06-28 LAB — PROTIME-INR
INR: 0.9 (ref 0.8–1.2)
Prothrombin Time: 12.4 seconds (ref 11.4–15.2)

## 2021-06-28 LAB — RESP PANEL BY RT-PCR (FLU A&B, COVID) ARPGX2
Influenza A by PCR: NEGATIVE
Influenza B by PCR: NEGATIVE
SARS Coronavirus 2 by RT PCR: NEGATIVE

## 2021-06-28 LAB — COMPREHENSIVE METABOLIC PANEL
ALT: 18 U/L (ref 0–44)
AST: 17 U/L (ref 15–41)
Albumin: 4.3 g/dL (ref 3.5–5.0)
Alkaline Phosphatase: 95 U/L (ref 38–126)
Anion gap: 11 (ref 5–15)
BUN: 10 mg/dL (ref 6–20)
CO2: 25 mmol/L (ref 22–32)
Calcium: 9.2 mg/dL (ref 8.9–10.3)
Chloride: 94 mmol/L — ABNORMAL LOW (ref 98–111)
Creatinine, Ser: 0.68 mg/dL (ref 0.61–1.24)
GFR, Estimated: >60 mL/min (ref >60)
Glucose, Bld: 424 mg/dL — ABNORMAL HIGH (ref 70–99)
Potassium: 4.1 mmol/L (ref 3.5–5.1)
Sodium: 130 mmol/L — ABNORMAL LOW (ref 135–145)
Total Bilirubin: 0.8 mg/dL (ref 0.3–1.2)
Total Protein: 7.1 g/dL (ref 6.5–8.1)

## 2021-06-28 LAB — CBG MONITORING, ED
Glucose-Capillary: 190 mg/dL — ABNORMAL HIGH (ref 70–99)
Glucose-Capillary: 114 mg/dL — ABNORMAL HIGH (ref 70–99)

## 2021-06-28 LAB — TRIGLYCERIDES: Triglycerides: 466 mg/dL — ABNORMAL HIGH (ref <150)

## 2021-06-28 LAB — GLUCOSE, CAPILLARY
Glucose-Capillary: 159 mg/dL — ABNORMAL HIGH (ref 70–99)
Glucose-Capillary: 86 mg/dL (ref 70–99)

## 2021-06-28 LAB — HIV ANTIBODY (ROUTINE TESTING W REFLEX): HIV Screen 4th Generation wRfx: NONREACTIVE

## 2021-06-28 LAB — LIPASE, BLOOD: Lipase: 72 U/L — ABNORMAL HIGH (ref 11–51)

## 2021-06-28 MED ORDER — LORAZEPAM 2 MG PO TABS
0.0000 mg | ORAL_TABLET | Freq: Four times a day (QID) | ORAL | Status: DC
  Filled 2021-06-28: qty 1

## 2021-06-28 MED ORDER — MOMETASONE FURO-FORMOTEROL FUM 100-5 MCG/ACT IN AERO
2.0000 | INHALATION_SPRAY | Freq: Two times a day (BID) | RESPIRATORY_TRACT | Status: DC
  Administered 2021-06-28 – 2021-06-29 (×2): 2 via RESPIRATORY_TRACT
  Filled 2021-06-28: qty 8.8

## 2021-06-28 MED ORDER — MECLIZINE HCL 25 MG PO TABS
25.0000 mg | ORAL_TABLET | Freq: Three times a day (TID) | ORAL | Status: DC | PRN
  Filled 2021-06-28: qty 1

## 2021-06-28 MED ORDER — ATORVASTATIN CALCIUM 20 MG PO TABS
40.0000 mg | ORAL_TABLET | Freq: Every day | ORAL | Status: DC
  Administered 2021-06-28 – 2021-06-29 (×2): 40 mg via ORAL
  Filled 2021-06-28 (×2): qty 2

## 2021-06-28 MED ORDER — ONDANSETRON HCL 4 MG/2ML IJ SOLN
4.0000 mg | Freq: Once | INTRAMUSCULAR | Status: AC
  Administered 2021-06-28: 4 mg via INTRAVENOUS
  Filled 2021-06-28: qty 2

## 2021-06-28 MED ORDER — INSULIN GLARGINE-YFGN 100 UNIT/ML ~~LOC~~ SOLN
7.0000 [IU] | Freq: Every day | SUBCUTANEOUS | Status: DC
  Filled 2021-06-28 (×3): qty 0.07

## 2021-06-28 MED ORDER — LORAZEPAM 2 MG/ML IJ SOLN: 1.0000 mg | INTRAMUSCULAR | Status: DC | PRN

## 2021-06-28 MED ORDER — THIAMINE HCL 100 MG/ML IJ SOLN: 100.0000 mg | Freq: Every day | INTRAMUSCULAR | Status: DC

## 2021-06-28 MED ORDER — GADOBUTROL 1 MMOL/ML IV SOLN
7.5000 mL | Freq: Once | INTRAVENOUS | Status: AC | PRN
  Administered 2021-06-28: 7.5 mL via INTRAVENOUS

## 2021-06-28 MED ORDER — IPRATROPIUM-ALBUTEROL 0.5-2.5 (3) MG/3ML IN SOLN
3.0000 mL | RESPIRATORY_TRACT | Status: DC
  Administered 2021-06-28: 3 mL via RESPIRATORY_TRACT
  Filled 2021-06-28 (×2): qty 3

## 2021-06-28 MED ORDER — INSULIN ASPART 100 UNIT/ML IJ SOLN
0.0000 [IU] | Freq: Three times a day (TID) | INTRAMUSCULAR | Status: DC
  Administered 2021-06-28: 2 [IU] via SUBCUTANEOUS
  Filled 2021-06-28: qty 1

## 2021-06-28 MED ORDER — SERTRALINE HCL 50 MG PO TABS
100.0000 mg | ORAL_TABLET | Freq: Every day | ORAL | Status: DC
  Administered 2021-06-29: 100 mg via ORAL
  Filled 2021-06-28 (×2): qty 2

## 2021-06-28 MED ORDER — NICOTINE 21 MG/24HR TD PT24
21.0000 mg | MEDICATED_PATCH | Freq: Every day | TRANSDERMAL | Status: DC
  Administered 2021-06-28 – 2021-06-29 (×2): 21 mg via TRANSDERMAL
  Filled 2021-06-28 (×3): qty 1

## 2021-06-28 MED ORDER — THIAMINE HCL 100 MG PO TABS
100.0000 mg | ORAL_TABLET | Freq: Every day | ORAL | Status: DC
  Administered 2021-06-28 – 2021-06-29 (×2): 100 mg via ORAL
  Filled 2021-06-28 (×2): qty 1

## 2021-06-28 MED ORDER — ONDANSETRON HCL 4 MG/2ML IJ SOLN: 4.0000 mg | Freq: Three times a day (TID) | INTRAMUSCULAR | Status: DC | PRN

## 2021-06-28 MED ORDER — ADULT MULTIVITAMIN W/MINERALS CH
1.0000 | ORAL_TABLET | Freq: Every day | ORAL | Status: DC
  Administered 2021-06-28 – 2021-06-29 (×2): 1 via ORAL
  Filled 2021-06-28 (×2): qty 1

## 2021-06-28 MED ORDER — OLANZAPINE 10 MG PO TABS
20.0000 mg | ORAL_TABLET | Freq: Every day | ORAL | Status: DC
  Administered 2021-06-28: 20 mg via ORAL
  Filled 2021-06-28 (×2): qty 2

## 2021-06-28 MED ORDER — ACETAMINOPHEN 650 MG RE SUPP: 650.0000 mg | Freq: Four times a day (QID) | RECTAL | Status: DC | PRN

## 2021-06-28 MED ORDER — FOLIC ACID 1 MG PO TABS
1.0000 mg | ORAL_TABLET | Freq: Every day | ORAL | Status: DC
  Administered 2021-06-28 – 2021-06-29 (×2): 1 mg via ORAL
  Filled 2021-06-28 (×2): qty 1

## 2021-06-28 MED ORDER — HYDROMORPHONE HCL 1 MG/ML IJ SOLN
1.0000 mg | Freq: Once | INTRAMUSCULAR | Status: AC
  Administered 2021-06-28: 1 mg via INTRAVENOUS
  Filled 2021-06-28: qty 1

## 2021-06-28 MED ORDER — FENOFIBRATE 160 MG PO TABS
160.0000 mg | ORAL_TABLET | Freq: Every day | ORAL | Status: DC
  Administered 2021-06-28 – 2021-06-29 (×2): 160 mg via ORAL
  Filled 2021-06-28 (×2): qty 1

## 2021-06-28 MED ORDER — SODIUM CHLORIDE 0.9 % IV SOLN: INTRAVENOUS | Status: DC

## 2021-06-28 MED ORDER — SODIUM CHLORIDE 0.9 % IV BOLUS (SEPSIS)
1000.0000 mL | Freq: Once | INTRAVENOUS | Status: AC
  Administered 2021-06-28: 1000 mL via INTRAVENOUS

## 2021-06-28 MED ORDER — LORAZEPAM 1 MG PO TABS: 1.0000 mg | ORAL_TABLET | ORAL | Status: DC | PRN

## 2021-06-28 MED ORDER — LORAZEPAM 2 MG PO TABS: 0.0000 mg | ORAL_TABLET | Freq: Two times a day (BID) | ORAL | Status: DC

## 2021-06-28 MED ORDER — POLYVINYL ALCOHOL 1.4 % OP SOLN
2.0000 [drp] | OPHTHALMIC | Status: DC | PRN
  Filled 2021-06-28: qty 15

## 2021-06-28 MED ORDER — HYDROMORPHONE HCL 1 MG/ML IJ SOLN
1.0000 mg | INTRAMUSCULAR | Status: DC | PRN
  Administered 2021-06-28 – 2021-06-29 (×8): 1 mg via INTRAVENOUS
  Filled 2021-06-28 (×8): qty 1

## 2021-06-28 MED ORDER — HEPARIN SODIUM (PORCINE) 5000 UNIT/ML IJ SOLN
5000.0000 [IU] | Freq: Three times a day (TID) | INTRAMUSCULAR | Status: DC
  Administered 2021-06-28 – 2021-06-29 (×3): 5000 [IU] via SUBCUTANEOUS
  Filled 2021-06-28 (×3): qty 1

## 2021-06-28 MED ORDER — INSULIN ASPART 100 UNIT/ML IJ SOLN: 0.0000 [IU] | Freq: Every day | INTRAMUSCULAR | Status: DC

## 2021-06-28 MED ORDER — DM-GUAIFENESIN ER 30-600 MG PO TB12
1.0000 | ORAL_TABLET | Freq: Two times a day (BID) | ORAL | Status: DC | PRN
  Administered 2021-06-28: 1 via ORAL
  Filled 2021-06-28: qty 1

## 2021-06-28 MED ORDER — ALPRAZOLAM 0.5 MG PO TABS
0.5000 mg | ORAL_TABLET | Freq: Four times a day (QID) | ORAL | Status: DC
  Administered 2021-06-28 – 2021-06-29 (×4): 0.5 mg via ORAL
  Filled 2021-06-28 (×4): qty 1

## 2021-06-28 MED ORDER — IPRATROPIUM-ALBUTEROL 0.5-2.5 (3) MG/3ML IN SOLN: 3.0000 mL | RESPIRATORY_TRACT | Status: DC | PRN

## 2021-06-28 MED ORDER — ACETAMINOPHEN 325 MG PO TABS
650.0000 mg | ORAL_TABLET | Freq: Four times a day (QID) | ORAL | Status: DC | PRN
  Administered 2021-06-28: 650 mg via ORAL
  Filled 2021-06-28: qty 2

## 2021-06-28 NOTE — ED Notes (Signed)
Patient to MRI at this time.

## 2021-06-28 NOTE — Progress Notes (Signed)
Inpatient Diabetes Program Recommendations  AACE/ADA: New Consensus Statement on Inpatient Glycemic Control (2015)  Target Ranges:  Prepandial:   less than 140 mg/dL      Peak postprandial:   less than 180 mg/dL (1-2 hours)      Critically ill patients:  140 - 180 mg/dL   Lab Results  Component Value Date   GLUCAP 114 (H) 06/28/2021   HGBA1C 11.2 (H) 06/06/2021    Review of Glycemic Control Results for CHAISON, GUILLORY" (MRN [id number]) as of 06/28/2021 13:26  Ref. Range 06/27/2021 13:48 06/28/2021 08:05 06/28/2021 11:46  Glucose-Capillary Latest Ref Range: 70 - 99 mg/dL 134 (H) 190 (H) 114 (H)   Diabetes history: DM 2 Outpatient Diabetes medications:  Trulicity 3 mg daily, Lantus 10 units q HS, Invokana 300 mg daily, Metformin (pt. Not taking) Current orders for Inpatient glycemic control:  Novolog sensitive tid with meals and HS Inpatient Diabetes Program Recommendations:   Note patient being admitted for pancreatitis. At d/c recommend d/c of Trulicity due to contraindication with pancreatitis.   Thanks,   Adah Perl, RN, BC-ADM Inpatient Diabetes Coordinator Pager [phone]  (8a-5p)

## 2021-06-28 NOTE — ED Triage Notes (Signed)
Pt arrived via POV with reports of being seen in ED  yesterday morning for abdominal pain was dx with pancreatitis and the possible need for ERCP. Pt left AMA from ED before he was admitted.  Pt continues to have abd pain and nauseated as well.  Pt asking for meds for pain control. Pt states he left because he was getting conflicting stories and was getting frustrated.  Pt states he wants to "get back" to where he was in the admitting process.  Pt c/o nausea at this time but denies vomiting.

## 2021-06-28 NOTE — ED Notes (Signed)
Attending provider, Dr Blaine Hamper, messaged per pt request. Pt reports having questions about NPO status and plan of care, states "I wish I could just have surgery".

## 2021-06-28 NOTE — ED Notes (Addendum)
Patient O2 dropped to upper 80's on RA after receiving pain medication. Patient placed on 2L with O2 now at 93%.  Patient given ice chips as requested.

## 2021-06-28 NOTE — ED Provider Notes (Addendum)
[facility] Emergency Department Provider Note  ____________________________________________   Event Date/Time   First MD Initiated Contact with Patient 06/28/21 [phone]     (approximate)  I have reviewed the triage vital signs and the nursing notes.   HISTORY  Chief Complaint Abdominal Pain    HPI Shaun Odonnell is a 55 y.o. male with history of bipolar disorder, recurrent pancreatitis, hyperlipidemia, diabetes, hypertension, COPD, alcohol abuse who presents to the emergency department with complaints of upper abdominal pain that sharp, severe in nature that radiates into the right chest.  Has had nausea without vomiting, diarrhea, fevers.  Seen here yesterday for the same and had a CT scan of the abdomen pelvis which showed a probable 6 mm calculus in the main pancreatic duct.  He also has a 3.7 cystic abnormality in the pancreatic tail which is slightly enlarged compared to prior.        Past Medical History:  Diagnosis Date   Alcohol abuse 04/29/2019   Allergy    Anxiety    ARDS (adult respiratory distress syndrome) (HCC)    ARDS (adult respiratory distress syndrome) (HCC)    Bipolar disorder (HCC)    Borderline diabetes    Cataract    Chronic kidney disease    COPD (chronic obstructive pulmonary disease) (HCC)    chronic cough and wheezing   Depression    Diabetes mellitus without complication (HCC)    Dizziness    medication related   Dyspnea    with exertion   Elevated lipids    Fatty liver    Hypercholesterolemia    Hypertension    Pancreatitis    Pneumonia    in past   Pneumonia    in past   Pre-diabetes    diet controlled   Pulmonary embolism ([place])    Schizoaffective disorder ([place])     Patient Active Problem List   Diagnosis Date Noted   Pancreatic duct stones 06/28/2021   Diabetes mellitus without complication ([place]) [id number]   Tobacco abuse 06/28/2021   Schizoaffective disorder ([place])    Palpitations 06/05/2021    Dizziness 06/05/2021   Drug-seeking behavior 09/17/2019   Hyperlipidemia associated with type 2 diabetes mellitus ([place]) 09/17/2019   Xyphoidalgia    Type 2 diabetes mellitus with hyperglycemia, without long-term current use of insulin ([place]) 04/29/2019   Chronic obstructive pulmonary disease ([place]) 04/29/2019   Alcohol abuse 04/29/2019   Bipolar depression ([place]) [id number]   Umbilical hernia without obstruction and without gangrene 04/23/2019   Acute pancreatitis 12/31/2018   Pancreatitis 07/02/2018    Past Surgical History:  Procedure Laterality Date   CATARACT EXTRACTION W/PHACO Right 03/26/2018   Procedure: CATARACT EXTRACTION PHACO AND INTRAOCULAR LENS PLACEMENT ([place]) RIGHT BORDERLINE DIABETIC;  Surgeon: Leandrew Koyanagi, MD;  Location: [place];  Service: Ophthalmology;  Laterality: Right;   CATARACT EXTRACTION W/PHACO Left 04/23/2018   Procedure: CATARACT EXTRACTION PHACO AND INTRAOCULAR LENS PLACEMENT ([place])  LEFT BORDERLINE DIABETIC;  Surgeon: Leandrew Koyanagi, MD;  Location: [place];  Service: Ophthalmology;  Laterality: Left;   COLONOSCOPY WITH PROPOFOL N/A 08/21/2017   Procedure: COLONOSCOPY WITH PROPOFOL;  Surgeon: Manya Silvas, MD;  Location: [facility] ENDOSCOPY;  Service: Endoscopy;  Laterality: N/A;   EYE SURGERY     HERNIA REPAIR     inguinal and umbilical   RIB RESECTION N/A 08/20/2019   Procedure: removal of xyphoid process;  Surgeon: Nestor Lewandowsky, MD;  Location: [facility];  Service: Thoracic;  Laterality: N/A;  UMBILICAL HERNIA REPAIR N/A 06/22/2019   Procedure: OPEN HERNIA REPAIR UMBILICAL ADULT;  Surgeon: Olean Ree, MD;  Location: [facility];  Service: General;  Laterality: N/A;   UPPER GI ENDOSCOPY      Prior to Admission medications   Medication Sig Start Date End Date Taking? Authorizing Provider  albuterol (PROVENTIL) (2.5 MG/3ML) 0.083% nebulizer solution Take 3 mLs (2.5 mg total) by nebulization every 6 (six) hours as needed for  wheezing or shortness of breath. 05/18/21   Chesley Mires, MD  albuterol (VENTOLIN HFA) 108 (90 Base) MCG/ACT inhaler INHALE 2 PUFFS BY MOUTH EVERY 6 HOURS ASNEEDED WHEEZING/ SHORTNESS OF BREATH 03/02/21   Birdie Sons, MD  ALPRAZolam Duanne Moron) 0.5 MG tablet Take 0.5 mg by mouth 4 (four) times daily. Patient takes when wakes up(~0600)/ 1000/ 1600/ 2000 06/26/18   [provider]  Azelastine-Fluticasone 137-50 MCG/ACT SUSP USE 1 SPRAY INTO EACH NOSTRIL TWICE DAILY AS DIRECTED 06/02/21   Jerrol Banana., MD  budesonide-formoterol [facility]) 80-4.5 MCG/ACT inhaler Inhale 2 puffs into the lungs in the morning and at bedtime. 05/18/21   Chesley Mires, MD  carboxymethylcellulose (REFRESH PLUS) 0.5 % SOLN Place 2 drops into both eyes as needed (Dry eyes).    [provider]  carboxymethylcellulose (REFRESH PLUS) 0.5 % SOLN Apply to eye.    [provider]  diclofenac Sodium (VOLTAREN) 1 % GEL Apply topically.    [provider]  Dulaglutide (TRULICITY) 3 0000000 SOPN Inject 3 mg as directed once a week. 06/12/21   Virginia Crews, MD  glucose blood test strip Use as instructed 12/17/19   Trinna Post, PA-C  insulin glargine (LANTUS SOLOSTAR) 100 UNIT/ML Solostar Pen INJECT 10 UNITS SUBCUTANEOUSLY AT BEDTIME 05/29/21   Virginia Crews, MD  Insulin Pen Needle (UNIFINE PENTIPS) 32G X 4 MM MISC USE WITH INSULIN ONCE DAILY 02/08/21   Mar Daring, PA-C  INVOKANA 300 MG TABS tablet TAKE 1 TABLET BY MOUTH ONCE DAILY BEFOREBREAFKAST 04/25/21   Chrismon, Vickki Muff, PA-C  meclizine (ANTIVERT) 25 MG tablet TAKE 2 TABLETS BY MOUTH 3 TIMES DAILY ASNEEDED 06/26/21   Gwyneth Sprout, FNP  metFORMIN (GLUCOPHAGE-XR) 500 MG 24 hr tablet Take 2 tablets (1,000 mg total) by mouth 2 (two) times daily. 04/24/21   Virginia Crews, MD  Multiple Vitamins-Minerals (CENTRUM SILVER PO) Take 1 tablet by mouth daily.    [provider]  OLANZapine (ZYPREXA) 15 MG tablet  Take 15 mg by mouth at bedtime. At 2000    [provider]  OLANZapine (ZYPREXA) 20 MG tablet  02/21/21   [provider]  OLANZapine (ZYPREXA) 20 MG tablet Take 20 mg by mouth at bedtime. 04/04/21   [provider]  ondansetron (ZOFRAN-ODT) 8 MG disintegrating tablet Take 1 tablet (8 mg total) by mouth every 8 (eight) hours as needed for nausea or vomiting. 06/16/21   Gwyneth Sprout, FNP  OneTouch Delica Lancets [id number] MISC USE TO CHECK FASTING SUGAR TWICE DAILY 12/07/20   Trinna Post, PA-C  sertraline (ZOLOFT) 100 MG tablet Take 100 mg by mouth daily.     [provider]  sertraline (ZOLOFT) 100 MG tablet Take by mouth.    [provider]  simvastatin (ZOCOR) 80 MG tablet TAKE 1 TABLET BY MOUTH AT BEDTIME 05/17/21   Virginia Crews, MD    Allergies Fentanyl, Morphine and related, Clonazepam, Pimozide, Trifluoperazine, Azithromycin, Montelukast, Morphine, and Tramadol  Family History  Problem Relation Age of  Onset   Hyperlipidemia Mother    Hypertension Father    Dementia Maternal Grandmother    Heart disease Maternal Grandfather    Heart disease Paternal Grandmother    Stroke Paternal Grandfather    Diabetes Paternal Grandfather    Diabetes Maternal Aunt     Social History Social History   Tobacco Use   Smoking status: Every Day    Packs/day: 1.50    Years: 34.00    Pack years: 51.00    Types: Cigarettes   Smokeless tobacco: Former    Types: Snuff  Vaping Use   Vaping Use: Former  Substance Use Topics   Alcohol use: Yes    Alcohol/week: 6.0 standard drinks    Types: 6 Cans of beer per week    Comment: 3 beers maybe 2x a week   Drug use: Not Currently    Review of Systems Constitutional: No fever. Eyes: No visual changes. ENT: No sore throat. Cardiovascular: Denies chest pain. Respiratory: Denies shortness of breath. Gastrointestinal: No vomiting, diarrhea. Genitourinary: Negative for dysuria. Musculoskeletal:  Negative for back pain. Skin: Negative for rash. Neurological: Negative for focal weakness or numbness.  ____________________________________________   PHYSICAL EXAM:  VITAL SIGNS: ED Triage Vitals  Enc Vitals Group     BP 06/28/21 0345 138/80     Pulse Rate 06/28/21 0345 63     Resp 06/28/21 0345 19     Temp 06/28/21 0345 97.6 F (36.4 C)     Temp Source 06/28/21 0345 Oral     SpO2 06/28/21 0345 93 %     Weight 06/28/21 0346 178 lb (80.7 kg)     Height 06/28/21 0346 '5\' 8"'$  (1.727 m)     Head Circumference --      Peak Flow --      Pain Score 06/28/21 0358 9     Pain Loc --      Pain Edu? --      Excl. in [place]? --    CONSTITUTIONAL: Alert and oriented and responds appropriately to questions.  Chronically ill-appearing, afebrile, nontoxic HEAD: Normocephalic EYES: Conjunctivae clear, pupils appear equal, EOM appear intact ENT: normal nose; moist mucous membranes NECK: Supple, normal ROM CARD: RRR; S1 and S2 appreciated; no murmurs, no clicks, no rubs, no gallops RESP: Normal chest excursion without splinting or tachypnea; breath sounds clear and equal bilaterally; no wheezes, no rhonchi, no rales, no hypoxia or respiratory distress, speaking full sentences ABD/GI: Normal bowel sounds; non-distended; soft, diffusely tender throughout the upper abdomen without guarding or rebound BACK: The back appears normal EXT: Normal ROM in all joints; no deformity noted, no edema; no cyanosis SKIN: Normal color for age and race; warm; no rash on exposed skin NEURO: Moves all extremities equally PSYCH: The patient's mood and manner are appropriate.  ____________________________________________   LABS (all labs ordered are listed, but only abnormal results are displayed)  Labs Reviewed  LIPASE, BLOOD - Abnormal; Notable for the following components:      Result Value   Lipase 72 (*)    All other components within normal limits  COMPREHENSIVE METABOLIC PANEL - Abnormal; Notable for the  following components:   Sodium 130 (*)    Chloride 94 (*)    Glucose, Bld 424 (*)    All other components within normal limits  CBC - Abnormal; Notable for the following components:   Hemoglobin 17.3 (*)    MCH 34.5 (*)    All other components within normal limits  URINALYSIS, COMPLETE (  UACMP) WITH MICROSCOPIC - Abnormal; Notable for the following components:   Color, Urine YELLOW (*)    APPearance CLEAR (*)    Glucose, UA >=500 (*)    Ketones, ur 5 (*)    All other components within normal limits  CBG MONITORING, ED - Abnormal; Notable for the following components:   Glucose-Capillary 190 (*)    All other components within normal limits  RESP PANEL BY RT-PCR (FLU A&B, COVID) [id number]  TRIGLYCERIDES   ____________________________________________  EKG   EKG Interpretation  Date/Time:  Wednesday June 28 2021 04:09:05 EDT Ventricular Rate:  62 PR Interval:  132 QRS Duration: 94 QT Interval:  404 QTC Calculation: 410 R Axis:   17 Text Interpretation: Normal sinus rhythm Normal ECG Confirmed by Pryor Curia [id number]) on 06/28/2021 7:11:20 AM        ____________________________________________  RADIOLOGY Jessie Foot Jered Heiny, personally viewed and evaluated these images (plain radiographs) as part of my medical decision making, as well as reviewing the written report by the radiologist.  ED MD interpretation: CT shows pancreatic ductal dilatation.  Official radiology report(s): No results found.  ____________________________________________   PROCEDURES  Procedure(s) performed (including Critical Care):  Procedures    ____________________________________________   INITIAL IMPRESSION / ASSESSMENT AND PLAN / ED COURSE  As part of my medical decision making, I reviewed the following data within the [place] notes reviewed and incorporated, Labs reviewed , EKG interpreted , Old EKG reviewed, Old chart reviewed, Discussed with admitting  physician , CT reviewed, and Notes from prior ED visits         Patient here for concerns for pancreatitis.  Found to have pancreatic ductal dilatation yesterday on CT imaging and a 3.7 cm cystic abnormality of the pancreatic tail.  MRI with and without gadolinium, MRCP was recommended.  Patient left AMA prior to being admitted to the hospital.  States his pain is back and he wants to be admitted now.  Discussed with Dr. Charna Archer, ED physician who cared for patient yesterday.  He states that he spoke with Dr. Allen Norris with gastroenterology to see if patient would need an ERCP.  He recommended transfer to tertiary care center.  [facility] was not able to accept patient due to bed availability.  Dr. Rush Landmark, hepatobiliary specialist at [facility] was consulted and told Dr. Charna Archer that this does not need any surgical intervention or an ERCP and would be treated like any other type of pancreatitis with symptomatic management and therefore could be managed here at [facility].  Labs here show no significant change compared to previous.  Minimally elevated lipase with normal LFTs.  I have gone ahead and ordered an MRI with and without contrast as well as MRCP.  He has hyperglycemia without DKA.  Will discuss with hospitalist.  Will give IV fluids, pain medication.  ED PROGRESS   7:35 AM Discussed patient's case with hospitalist, Dr. Blaine Hamper.  I have recommended admission and patient (and family if present) agree with this plan. Admitting physician will place admission orders.   I reviewed all nursing notes, vitals, pertinent previous records and reviewed/interpreted all EKGs, lab and urine results, imaging (as available).  ____________________________________________   FINAL CLINICAL IMPRESSION(S) / ED DIAGNOSES  Final diagnoses:  Other acute pancreatitis with uninfected necrosis  Hyperglycemia     ED Discharge Orders     None       *Please note:  Shaun Odonnell was evaluated in Emergency Department on  06/28/2021 for the  symptoms described in the history of present illness. He was evaluated in the context of the global COVID-19 pandemic, which necessitated consideration that the patient might be at risk for infection with the SARS-CoV-2 virus that causes COVID-19. Institutional protocols and algorithms that pertain to the evaluation of patients at risk for COVID-19 are in a state of rapid change based on information released by regulatory bodies including the CDC and federal and state organizations. These policies and algorithms were followed during the patient's care in the ED.  Some ED evaluations and interventions may be delayed as a result of limited staffing during and the pandemic.*   Note:  This document was prepared using Dragon voice recognition software and may include unintentional dictation errors.    Calyb Mcquarrie, Delice Bison, DO 06/28/21 [place], Delice Bison, DO 06/28/21 [phone]

## 2021-06-28 NOTE — ED Notes (Signed)
Patient doing MRI screening at this time. 

## 2021-06-28 NOTE — ED Notes (Signed)
Patient given ice chips at this time. No needs expressed to RN.

## 2021-06-28 NOTE — H&P (Signed)
History and Physical    Shaun Odonnell [id number] DOB: 1966-10-30 DOA: 06/28/2021  Referring MD/NP/PA:   PCP: Gwyneth Sprout, FNP   Patient coming from:  The patient is coming from home.  At baseline, pt is independent for most of ADL.        Chief Complaint: abdominal pain  HPI: Shaun Odonnell is a 55 y.o. male with medical history significant of pancreatitis, hyperlipidemia, diabetes mellitus, COPD, depression, anxiety, PE 2003 not on anticoagulants, schizophrenia, bipolar, depression, anxiety, alcohol abuse, tobacco abuse, who presents with abdominal pain.  Patient states that his abdomen started yesterday morning, which is located in upper and middle abdomen, constant, 9-10 out of 10 in severity, aching, radiating to bilateral flank area.  Patient has nausea, no vomiting or diarrhea.  No fever or chills.  Patient has mild dry cough and mild shortness breath, no chest pain.  Denies symptoms of UTI. Pt was have lipase 72. CT showed a probable 6 mm calculus seen in main pancreatic duct in the pancreatic head resulting in pancreatic ductal dilatation.  He also has a 3.7 cystic abnormality in the pancreatic tail which is slightly enlarged compared to prior. EDP consulted Dr. Allen Norris of GI, but pt left hospital AMA before Dr. Leonides Schanz saw patient yesterday.   Per EDP, Dr. Allen Norris "recommended transfer to tertiary care center.  [facility] was not able to accept patient due to bed availability.  Dr. Rush Landmark, hepatobiliary specialist at [facility] was consulted and told Dr. Charna Archer that this does not need any surgical intervention or an ERCP and would be treated like any other type of pancreatitis with symptomatic management and therefore could be managed here at [facility]".      ED Course: pt was found to have liver function with ALP 103, AST 17, ALT 18, total bilirubin 0.8 yesterday, the repeated liver function without significant change, which showed ALP 95, AST 17, ALT 18, total bilirubin 0.5.  Triglyceride  level 466, WBC 8.2, negative COVID-19 PCR, renal function okay, temperature normal, blood pressure 138/80, heart rate 86, RR 23, oxygen saturation 88-94 percent on room air. Pt is admitted to [place] bed as inpatient  CT-abdomen/pelvis Probable 6 mm calculus seen in main pancreatic duct in the pancreatic head resulting in pancreatic ductal dilatation. MRCP may be performed for further evaluation.   Also noted is 3.7 cm cystic abnormality in the pancreatic tail which is slightly enlarged compared to prior exam and demonstrates small peripheral solid component. Further evaluation with MRI with and without gadolinium is recommended.   Hepatic steatosis.   Small nonobstructive left renal calculus.   Aortic Atherosclerosis (ICD10-I70.0).  MRCP: 1. Prominent pancreatic duct measuring up to 0.6 cm in caliber. As on prior CT, suspect a calculus in the central pancreatic duct measuring approximately 0.5 cm. This was calcified and therefore better appreciated by prior CT. 2. No biliary tract calculi identified. No biliary ductal dilatation. 3. No acute inflammatory findings. 4. There is a 3.3 cm nonenhancing, exophytic fluid signal cyst of the pancreatic tail, consistent with a pancreatic pseudocyst given history of pancreatitis. This is stable on multiple prior examinations dating back to at least 2019 and benign. No specific further follow-up or characterization is required. 5. Mild hepatic steatosis.    Review of Systems:   General: no fevers, chills, no body weight gain, has poor appetite, has fatigue HEENT: no blurry vision, hearing changes or sore throat Respiratory: has dyspnea, coughing,  no wheezing CV: no chest pain, no palpitations GI:  has nausea,  abdominal pain, no diarrhea, constipation, vomiting, GU: no dysuria, burning on urination, increased urinary frequency, hematuria  Ext: no leg edema Neuro: no unilateral weakness, numbness, or tingling, no vision change or  hearing loss Skin: no rash, no skin tear. MSK: No muscle spasm, no deformity, no limitation of range of movement in spin Heme: No easy bruising.  Travel history: No recent long distant travel.  Allergy:  Allergies  Allergen Reactions   Fentanyl Nausea And Vomiting    Other reaction(s): Unknown   Morphine And Related Shortness Of Breath   Clonazepam Other (See Comments)    Dystonic Reaction   Other reaction(s): Other (See Comments) "Dystonic"; alprazolam is a home med "Dystonic"; alprazolam is a home med Dystonic Reaction   Pimozide Other (See Comments)    Dystonic Reaction  Other reaction(s): Other (See Comments) "Distonic" "Distonic" Dystonic Reaction   Trifluoperazine Other (See Comments)    Dystonic Reaction Other reaction(s): Other (See Comments) "Distonic" "Distonic" Dystonic reaction Dystonic Reaction   Azithromycin Other (See Comments)    Was told not to take Z pak due to his heart   Montelukast     headache, nausea, feeling a little anxious, panic   Morphine Other (See Comments)    Other reaction(s): Other (See Comments) " I stop breathing"; can take Diluadid " I stop breathing"; can take Diluadid Cnnot tolerate d/t Drug Metabolism   Tramadol Nausea Only    Past Medical History:  Diagnosis Date   Alcohol abuse 04/29/2019   Allergy    Anxiety    ARDS (adult respiratory distress syndrome) (HCC)    ARDS (adult respiratory distress syndrome) (HCC)    Bipolar disorder (HCC)    Borderline diabetes    Cataract    Chronic kidney disease    COPD (chronic obstructive pulmonary disease) (HCC)    chronic cough and wheezing   Depression    Diabetes mellitus without complication (HCC)    Dizziness    medication related   Dyspnea    with exertion   Elevated lipids    Fatty liver    Hypercholesterolemia    Hypertension    Pancreatitis    Pneumonia    in past   Pneumonia    in past   Pre-diabetes    diet controlled   Pulmonary embolism (HCC)     Schizoaffective disorder ([place])     Past Surgical History:  Procedure Laterality Date   CATARACT EXTRACTION W/PHACO Right 03/26/2018   Procedure: CATARACT EXTRACTION PHACO AND INTRAOCULAR LENS PLACEMENT ([place]) RIGHT BORDERLINE DIABETIC;  Surgeon: Leandrew Koyanagi, MD;  Location: [place];  Service: Ophthalmology;  Laterality: Right;   CATARACT EXTRACTION W/PHACO Left 04/23/2018   Procedure: CATARACT EXTRACTION PHACO AND INTRAOCULAR LENS PLACEMENT ([place])  LEFT BORDERLINE DIABETIC;  Surgeon: Leandrew Koyanagi, MD;  Location: [place];  Service: Ophthalmology;  Laterality: Left;   COLONOSCOPY WITH PROPOFOL N/A 08/21/2017   Procedure: COLONOSCOPY WITH PROPOFOL;  Surgeon: Manya Silvas, MD;  Location: [facility] ENDOSCOPY;  Service: Endoscopy;  Laterality: N/A;   EYE SURGERY     HERNIA REPAIR     inguinal and umbilical   RIB RESECTION N/A 08/20/2019   Procedure: removal of xyphoid process;  Surgeon: Nestor Lewandowsky, MD;  Location: [facility] ORS;  Service: Thoracic;  Laterality: N/A;   UMBILICAL HERNIA REPAIR N/A 06/22/2019   Procedure: OPEN HERNIA REPAIR UMBILICAL ADULT;  Surgeon: Olean Ree, MD;  Location: [facility] ORS;  Service: General;  Laterality: N/A;   UPPER GI  ENDOSCOPY      Social History:  reports that he has been smoking cigarettes. He has a 51.00 pack-year smoking history. He has quit using smokeless tobacco.  His smokeless tobacco use included snuff. He reports current alcohol use of about 6.0 standard drinks per week. He reports that he does not currently use drugs.  Family History:  Family History  Problem Relation Age of Onset   Hyperlipidemia Mother    Hypertension Father    Dementia Maternal Grandmother    Heart disease Maternal Grandfather    Heart disease Paternal Grandmother    Stroke Paternal Grandfather    Diabetes Paternal Grandfather    Diabetes Maternal Aunt      Prior to Admission medications   Medication Sig Start Date End Date Taking?  Authorizing Provider  albuterol (PROVENTIL) (2.5 MG/3ML) 0.083% nebulizer solution Take 3 mLs (2.5 mg total) by nebulization every 6 (six) hours as needed for wheezing or shortness of breath. 05/18/21   Chesley Mires, MD  albuterol (VENTOLIN HFA) 108 (90 Base) MCG/ACT inhaler INHALE 2 PUFFS BY MOUTH EVERY 6 HOURS ASNEEDED WHEEZING/ SHORTNESS OF BREATH 03/02/21   Birdie Sons, MD  ALPRAZolam Duanne Moron) 0.5 MG tablet Take 0.5 mg by mouth 4 (four) times daily. Patient takes when wakes up(~0600)/ 1000/ 1600/ 2000 06/26/18   [provider]  Azelastine-Fluticasone 137-50 MCG/ACT SUSP USE 1 SPRAY INTO EACH NOSTRIL TWICE DAILY AS DIRECTED 06/02/21   Jerrol Banana., MD  budesonide-formoterol [facility]) 80-4.5 MCG/ACT inhaler Inhale 2 puffs into the lungs in the morning and at bedtime. 05/18/21   Chesley Mires, MD  carboxymethylcellulose (REFRESH PLUS) 0.5 % SOLN Place 2 drops into both eyes as needed (Dry eyes).    [provider]  carboxymethylcellulose (REFRESH PLUS) 0.5 % SOLN Apply to eye.    [provider]  diclofenac Sodium (VOLTAREN) 1 % GEL Apply topically.    [provider]  Dulaglutide (TRULICITY) 3 0000000 SOPN Inject 3 mg as directed once a week. 06/12/21   Virginia Crews, MD  glucose blood test strip Use as instructed 12/17/19   Trinna Post, PA-C  insulin glargine (LANTUS SOLOSTAR) 100 UNIT/ML Solostar Pen INJECT 10 UNITS SUBCUTANEOUSLY AT BEDTIME 05/29/21   Virginia Crews, MD  Insulin Pen Needle (UNIFINE PENTIPS) 32G X 4 MM MISC USE WITH INSULIN ONCE DAILY 02/08/21   Mar Daring, PA-C  INVOKANA 300 MG TABS tablet TAKE 1 TABLET BY MOUTH ONCE DAILY BEFOREBREAFKAST 04/25/21   Chrismon, Vickki Muff, PA-C  meclizine (ANTIVERT) 25 MG tablet TAKE 2 TABLETS BY MOUTH 3 TIMES DAILY ASNEEDED 06/26/21   Gwyneth Sprout, FNP  metFORMIN (GLUCOPHAGE-XR) 500 MG 24 hr tablet Take 2 tablets (1,000 mg total) by mouth 2 (two) times daily. 04/24/21    Virginia Crews, MD  Multiple Vitamins-Minerals (CENTRUM SILVER PO) Take 1 tablet by mouth daily.    [provider]  OLANZapine (ZYPREXA) 15 MG tablet Take 15 mg by mouth at bedtime. At 2000    [provider]  OLANZapine (ZYPREXA) 20 MG tablet  02/21/21   [provider]  OLANZapine (ZYPREXA) 20 MG tablet Take 20 mg by mouth at bedtime. 04/04/21   [provider]  ondansetron (ZOFRAN-ODT) 8 MG disintegrating tablet Take 1 tablet (8 mg total) by mouth every 8 (eight) hours as needed for nausea or vomiting. 06/16/21   Gwyneth Sprout, FNP  OneTouch Delica Lancets [id number] MISC USE TO CHECK FASTING SUGAR TWICE DAILY 12/07/20  Carles Collet M, PA-C  sertraline (ZOLOFT) 100 MG tablet Take 100 mg by mouth daily.     [provider]  sertraline (ZOLOFT) 100 MG tablet Take by mouth.    [provider]  simvastatin (ZOCOR) 80 MG tablet TAKE 1 TABLET BY MOUTH AT BEDTIME 05/17/21   Virginia Crews, MD    Physical Exam: Vitals:   06/28/21 0730 06/28/21 0819 06/28/21 0951 06/28/21 0956  BP: 128/72 117/72 140/78   Pulse: (!) 55 (!) 52 (!) 59   Resp: 18  18   Temp:      TempSrc:      SpO2: 93%  98% 98%  Weight:      Height:       General: Not in acute distress HEENT:       Eyes: PERRL, EOMI, no scleral icterus.       ENT: No discharge from the ears and nose, no pharynx injection, no tonsillar enlargement.        Neck: No JVD, no bruit, no mass felt. Heme: No neck lymph node enlargement. Cardiac: S1/S2, RRR, No murmurs, No gallops or rubs. Respiratory: No rales, wheezing, rhonchi or rubs. GI: Soft, nondistended, has tenderness in upper and mid abdomen, no rebound pain, no organomegaly, BS present. GU: No hematuria Ext: No pitting leg edema bilaterally. 1+DP/PT pulse bilaterally. Musculoskeletal: No joint deformities, No joint redness or warmth, no limitation of ROM in spin. Skin: No rashes.  Neuro: Alert, oriented X3, cranial nerves II-XII  grossly intact, moves all extremities normally.  Psych: Patient is not psychotic, no suicidal or hemocidal ideation.  Labs on Admission: I have personally reviewed following labs and imaging studies  CBC: Recent Labs  Lab 06/27/21 0429 06/28/21 0406  WBC 10.0 8.2  NEUTROABS 6.9  --   HGB 18.2* 17.3*  HCT 52.0 48.6  MCV 97.0 96.8  PLT 167 [id number]   Basic Metabolic Panel: Recent Labs  Lab 06/27/21 0429 06/28/21 0406  NA 133* 130*  K 4.1 4.1  CL 93* 94*  CO2 29 25  GLUCOSE 331* 424*  BUN 14 10  CREATININE 0.75 0.68  CALCIUM 9.4 9.2   GFR: Estimated Creatinine Clearance: 104.5 mL/min (by C-G formula based on SCr of 0.68 mg/dL). Liver Function Tests: Recent Labs  Lab 06/27/21 0429 06/28/21 0406  AST 16 17  ALT 20 18  ALKPHOS 103 95  BILITOT 0.8 0.8  PROT 7.4 7.1  ALBUMIN 4.5 4.3   Recent Labs  Lab 06/27/21 0429 06/28/21 0406  LIPASE 78* 72*   No results for input(s): AMMONIA in the last 168 hours. Coagulation Profile: No results for input(s): INR, PROTIME in the last 168 hours. Cardiac Enzymes: No results for input(s): CKTOTAL, CKMB, CKMBINDEX, TROPONINI in the last 168 hours. BNP (last 3 results) No results for input(s): PROBNP in the last 8760 hours. HbA1C: No results for input(s): HGBA1C in the last 72 hours. CBG: Recent Labs  Lab 06/27/21 1348 06/28/21 0805  GLUCAP 134* 190*   Lipid Profile: Recent Labs    06/28/21 0420  TRIG 466*   Thyroid Function Tests: No results for input(s): TSH, T4TOTAL, FREET4, T3FREE, THYROIDAB in the last 72 hours. Anemia Panel: No results for input(s): VITAMINB12, FOLATE, FERRITIN, TIBC, IRON, RETICCTPCT in the last 72 hours. Urine analysis:    Component Value Date/Time   COLORURINE YELLOW (A) 06/28/2021 0406   APPEARANCEUR CLEAR (A) 06/28/2021 0406   APPEARANCEUR Clear 11/26/2014 0336   LABSPEC 1.025 06/28/2021 0406   LABSPEC  1.002 11/26/2014 0336   PHURINE 6.0 06/28/2021 0406   GLUCOSEU >=500 (A) 06/28/2021  0406   GLUCOSEU Negative 11/26/2014 0336   HGBUR NEGATIVE 06/28/2021 0406   BILIRUBINUR NEGATIVE 06/28/2021 0406   BILIRUBINUR Negative 06/30/2019 1435   BILIRUBINUR Negative 11/26/2014 0336   KETONESUR 5 (A) 06/28/2021 0406   PROTEINUR NEGATIVE 06/28/2021 0406   UROBILINOGEN 0.2 06/30/2019 1435   NITRITE NEGATIVE 06/28/2021 0406   LEUKOCYTESUR NEGATIVE 06/28/2021 0406   LEUKOCYTESUR Negative 11/26/2014 0336   Sepsis Labs: '@LABRCNTIP'$ (procalcitonin:4,lacticidven:4) ) Recent Results (from the past 240 hour(s))  Resp Panel by RT-PCR (Flu A&B, Covid) Nasopharyngeal Swab     Status: None   Collection Time: 06/27/21 11:05 AM   Specimen: Nasopharyngeal Swab; Nasopharyngeal(NP) swabs in vial transport medium  Result Value Ref Range Status   SARS Coronavirus 2 by RT PCR NEGATIVE NEGATIVE Final    Comment: (NOTE) SARS-CoV-2 target nucleic acids are NOT DETECTED.  The SARS-CoV-2 RNA is generally detectable in upper respiratory specimens during the acute phase of infection. The lowest concentration of SARS-CoV-2 viral copies this assay can detect is 138 copies/mL. A negative result does not preclude SARS-Cov-2 infection and should not be used as the sole basis for treatment or other patient management decisions. A negative result may occur with  improper specimen collection/handling, submission of specimen other than nasopharyngeal swab, presence of viral mutation(s) within the areas targeted by this assay, and inadequate number of viral copies(<138 copies/mL). A negative result must be combined with clinical observations, patient history, and epidemiological information. The expected result is Negative.  Fact Sheet for Patients:  EntrepreneurPulse.com.au  Fact Sheet for Healthcare Providers:  IncredibleEmployment.be  This test is no t yet approved or cleared by the [name] FDA and  has been authorized for detection and/or diagnosis of  SARS-CoV-2 by FDA under an Emergency Use Authorization (EUA). This EUA will remain  in effect (meaning this test can be used) for the duration of the COVID-19 declaration under Section 564(b)(1) of the Act, 21 U.S.C.section 360bbb-3(b)(1), unless the authorization is terminated  or revoked sooner.       Influenza A by PCR NEGATIVE NEGATIVE Final   Influenza B by PCR NEGATIVE NEGATIVE Final    Comment: (NOTE) The Xpert Xpress SARS-CoV-2/FLU/RSV plus assay is intended as an aid in the diagnosis of influenza from Nasopharyngeal swab specimens and should not be used as a sole basis for treatment. Nasal washings and aspirates are unacceptable for Xpert Xpress SARS-CoV-2/FLU/RSV testing.  Fact Sheet for Patients: EntrepreneurPulse.com.au  Fact Sheet for Healthcare Providers: IncredibleEmployment.be  This test is not yet approved or cleared by the [name] FDA and has been authorized for detection and/or diagnosis of SARS-CoV-2 by FDA under an Emergency Use Authorization (EUA). This EUA will remain in effect (meaning this test can be used) for the duration of the COVID-19 declaration under Section 564(b)(1) of the Act, 21 U.S.C. section 360bbb-3(b)(1), unless the authorization is terminated or revoked.  Performed at [facility], [address] [place], [place] [address]   Resp Panel by RT-PCR (Flu A&B, Covid) Nasopharyngeal Swab     Status: None   Collection Time: 06/28/21  8:22 AM   Specimen: Nasopharyngeal Swab; Nasopharyngeal(NP) swabs in vial transport medium  Result Value Ref Range Status   SARS Coronavirus 2 by RT PCR NEGATIVE NEGATIVE Final    Comment: (NOTE) SARS-CoV-2 target nucleic acids are NOT DETECTED.  The SARS-CoV-2 RNA is generally detectable in upper respiratory specimens during the acute phase  of infection. The lowest concentration of SARS-CoV-2 viral copies this assay can detect is 138 copies/mL. A negative  result does not preclude SARS-Cov-2 infection and should not be used as the sole basis for treatment or other patient management decisions. A negative result may occur with  improper specimen collection/handling, submission of specimen other than nasopharyngeal swab, presence of viral mutation(s) within the areas targeted by this assay, and inadequate number of viral copies(<138 copies/mL). A negative result must be combined with clinical observations, patient history, and epidemiological information. The expected result is Negative.  Fact Sheet for Patients:  EntrepreneurPulse.com.au  Fact Sheet for Healthcare Providers:  IncredibleEmployment.be  This test is no t yet approved or cleared by the [name] FDA and  has been authorized for detection and/or diagnosis of SARS-CoV-2 by FDA under an Emergency Use Authorization (EUA). This EUA will remain  in effect (meaning this test can be used) for the duration of the COVID-19 declaration under Section 564(b)(1) of the Act, 21 U.S.C.section 360bbb-3(b)(1), unless the authorization is terminated  or revoked sooner.       Influenza A by PCR NEGATIVE NEGATIVE Final   Influenza B by PCR NEGATIVE NEGATIVE Final    Comment: (NOTE) The Xpert Xpress SARS-CoV-2/FLU/RSV plus assay is intended as an aid in the diagnosis of influenza from Nasopharyngeal swab specimens and should not be used as a sole basis for treatment. Nasal washings and aspirates are unacceptable for Xpert Xpress SARS-CoV-2/FLU/RSV testing.  Fact Sheet for Patients: EntrepreneurPulse.com.au  Fact Sheet for Healthcare Providers: IncredibleEmployment.be  This test is not yet approved or cleared by the [name] FDA and has been authorized for detection and/or diagnosis of SARS-CoV-2 by FDA under an Emergency Use Authorization (EUA). This EUA will remain in effect (meaning this test can be used)  for the duration of the COVID-19 declaration under Section 564(b)(1) of the Act, 21 U.S.C. section 360bbb-3(b)(1), unless the authorization is terminated or revoked.  Performed at [facility], [address] [place], [place] [address]      Radiological Exams on Admission: CT Abdomen Pelvis W Contrast  Result Date: 06/27/2021 CLINICAL DATA:  Acute upper abdominal pain. History of pancreatitis. EXAM: CT ABDOMEN AND PELVIS WITH CONTRAST TECHNIQUE: Multidetector CT imaging of the abdomen and pelvis was performed using the standard protocol following bolus administration of intravenous contrast. CONTRAST:  67m OMNIPAQUE IOHEXOL 350 MG/ML SOLN COMPARISON:  May 11, 2019. FINDINGS: Lower chest: No acute abnormality. Hepatobiliary: No gallstones or biliary dilatation is noted. Hepatic steatosis is noted. Pancreas: There appears to be a 6 mm calculus in the main pancreatic duct in the pancreatic head resulting in pancreatic ductal dilatation. No acute inflammation is noted. 3.7 cm cystic abnormality is seen arising from pancreatic tail with small peripheral solid component; this appears to be slightly enlarged compared to prior exam. Spleen: Normal in size without focal abnormality. Adrenals/Urinary Tract: Adrenal glands appear normal. Small nonobstructive left renal calculus is noted. No hydronephrosis or renal obstruction is noted. Urinary bladder is unremarkable. Stomach/Bowel: The stomach appears normal. There is no evidence of bowel obstruction or inflammation. The appendix is not clearly visualized. Stool is noted throughout the colon. Vascular/Lymphatic: Aortic atherosclerosis. No enlarged abdominal or pelvic lymph nodes. Reproductive: Prostate is unremarkable. Other: No abdominal wall hernia or abnormality. No abdominopelvic ascites. Musculoskeletal: No acute or significant osseous findings. IMPRESSION: Probable 6 mm calculus seen in main pancreatic duct in the pancreatic head resulting in  pancreatic ductal dilatation. MRCP may be performed for further  evaluation. Also noted is 3.7 cm cystic abnormality in the pancreatic tail which is slightly enlarged compared to prior exam and demonstrates small peripheral solid component. Further evaluation with MRI with and without gadolinium is recommended. Hepatic steatosis. Small nonobstructive left renal calculus. Aortic Atherosclerosis (ICD10-I70.0). Electronically Signed   By: Marijo Conception M.D.   On: 06/27/2021 10:05   MR ABDOMEN MRCP W WO CONTAST  Result Date: 06/28/2021 CLINICAL DATA:  Pancreatitis suspected, abnormal CT, pancreatic duct calculus suspected EXAM: MRI ABDOMEN WITHOUT AND WITH CONTRAST (INCLUDING MRCP) TECHNIQUE: Multiplanar multisequence MR imaging of the abdomen was performed both before and after the administration of intravenous contrast. Heavily T2-weighted images of the biliary and pancreatic ducts were obtained, and three-dimensional MRCP images were rendered by post processing. CONTRAST:  7.48m GADAVIST GADOBUTROL 1 MMOL/ML IV SOLN COMPARISON:  CT abdomen pelvis, 06/27/2021, 07/02/2018 FINDINGS: Lower chest: No acute findings. Hepatobiliary: Mild hepatic steatosis. No mass or other parenchymal abnormality identified. Pancreas: No mass, inflammatory changes, or other parenchymal abnormality identified. Nonenhancing, exophytic fluid signal cysts of the pancreatic tail measuring 3.3 cm (series 11, image 12). Prominent pancreatic duct measuring up to 0.6 cm in caliber (series 11, image 16). As on prior CT, suspect a calculus in the central pancreatic duct measuring approximately 0.5 cm (series 18, image 4); calcified and therefore better appreciated by prior CT. Spleen:  Within normal limits in size and appearance. Adrenals/Urinary Tract: No masses identified. No evidence of hydronephrosis. Stomach/Bowel: Visualized portions within the abdomen are unremarkable. Vascular/Lymphatic: No pathologically enlarged lymph nodes identified.  No abdominal aortic aneurysm demonstrated. Other:  None. Musculoskeletal: No suspicious bone lesions identified. IMPRESSION: 1. Prominent pancreatic duct measuring up to 0.6 cm in caliber. As on prior CT, suspect a calculus in the central pancreatic duct measuring approximately 0.5 cm. This was calcified and therefore better appreciated by prior CT. 2. No biliary tract calculi identified. No biliary ductal dilatation. 3. No acute inflammatory findings. 4. There is a 3.3 cm nonenhancing, exophytic fluid signal cyst of the pancreatic tail, consistent with a pancreatic pseudocyst given history of pancreatitis. This is stable on multiple prior examinations dating back to at least 2019 and benign. No specific further follow-up or characterization is required. 5. Mild hepatic steatosis. Electronically Signed   By: AEddie CandleM.D.   On: 06/28/2021 09:52     EKG: I have personally reviewed.  Sinus rhythm, QTC 410, poor R wave progression, T wave inversion in V4-V5  Assessment/Plan Principal Problem:   Acute pancreatitis Active Problems:   Chronic obstructive pulmonary disease (HCC)   Alcohol abuse   Bipolar depression (HCC)   Hyperlipidemia associated with type 2 diabetes mellitus (HCC)   Pancreatic duct stones   Diabetes mellitus without complication (HCC)   Tobacco abuse   Schizoaffective disorder ([place]   Hypertriglyceridemia   Acute pancreatitis: This is a recurrent issue.  Likely multifactorial etiology, including alcohol abuse, elevated TG level 466, and pancreatic duct stone.  As mentioned in the HPI, Dr. WAllen NorrisGI "recommended transfer to tertiary care center.  [facility] was not able to accept patient due to bed availability.  [facility] Dr. JCharna Archerthat this does not need any surgical intervention or an ERCP and would be treated like any other type of pancreatitis with symptomatic management and [facility]  MRCP showed a possible calculus in the central pancreatic duct, measuring approximately 0.5 cm. prominent pancreatic duct measuring up to  0.6 cm in caliber. Aslo showed a 3.3 cm nonenhancing, exophytic fluid signal cyst of the pancreatic tail, consistent with a pancreatic pseudocyst. Pt states that Dr. Allen Norris is his GI doctor. Dr. Allen Norris did procedure for his mother. He want [place] to consult Dr. Allen Norris tomorrow (Dr. Allen Norris will be on-call tomorrow).  -will admit to med-surg bed as inpt -keep NPO -start fenofibrate 160 mg daily due to elevated triglyceride level -As needed Dilaudid for pain and Zofran for nausea -please consult Dr. Allen Norris in AM  Pancreatic duct stones: -need to reconsult Dr. Allen Norris in AM  Chronic obstructive pulmonary disease ([employer]) -Bronchodilators  Alcohol abuse and tobacco abuse -CIWA protocol -Nicotine patch -Did counseling about importance of quitting alcohol use and tobacco use  Bipolar depression  and schizoaffective disorder: -Continue home Xanax, olanzapine, Zoloft  Hyperlipidemia associated with type 2 diabetes mellitus ([place]): Patient is taking Zocor at home. -Switched to Lipitor in hospital  Diabetes mellitus without complication [facility]): Recent A1c 11.2, poorly controlled.  Patient is not taking Invokana, Trulicity and Lantus -Sliding scale insulin -Decrease Lantus dose from 10 to 7 unit daily  Hypertriglyceridemia: Triglyceride 466 -Started fenofibrate 160 mg daily       DVT ppx: SQ Lovenox Code Status: Full code Family Communication: not done, no family member is at bed side.    Disposition Plan:  Anticipate discharge back to previous environment Consults called:  none today Admission status and Level of care: Med-Surg:  as inpt        Status is: Inpatient  Remains inpatient appropriate because:Inpatient level of care appropriate due to severity of illness  Dispo: The patient is from: Home              Anticipated d/c is to: Home              Patient  currently is not medically stable to d/c.   Difficult to place patient No          Date of Service 06/28/2021    Ivor Costa [facility]   If 7PM-7AM, please contact night-coverage [email or web address] 06/28/2021, 11:00 AM

## 2021-06-29 ENCOUNTER — Other Ambulatory Visit: Payer: Self-pay

## 2021-06-29 DIAGNOSIS — K859 Acute pancreatitis without necrosis or infection, unspecified: Secondary | ICD-10-CM | POA: Diagnosis not present

## 2021-06-29 DIAGNOSIS — K852 Alcohol induced acute pancreatitis without necrosis or infection: Secondary | ICD-10-CM | POA: Diagnosis not present

## 2021-06-29 DIAGNOSIS — K8581 Other acute pancreatitis with uninfected necrosis: Secondary | ICD-10-CM | POA: Diagnosis not present

## 2021-06-29 DIAGNOSIS — K8689 Other specified diseases of pancreas: Secondary | ICD-10-CM | POA: Diagnosis not present

## 2021-06-29 LAB — COMPREHENSIVE METABOLIC PANEL
ALT: 16 U/L (ref 0–44)
AST: 15 U/L (ref 15–41)
Albumin: 3.6 g/dL (ref 3.5–5.0)
Alkaline Phosphatase: 76 U/L (ref 38–126)
Anion gap: 8 (ref 5–15)
BUN: 7 mg/dL (ref 6–20)
CO2: 26 mmol/L (ref 22–32)
Calcium: 8.6 mg/dL — ABNORMAL LOW (ref 8.9–10.3)
Chloride: 102 mmol/L (ref 98–111)
Creatinine, Ser: 0.6 mg/dL — ABNORMAL LOW (ref 0.61–1.24)
GFR, Estimated: >60 mL/min (ref >60)
Glucose, Bld: 104 mg/dL — ABNORMAL HIGH (ref 70–99)
Potassium: 4.3 mmol/L (ref 3.5–5.1)
Sodium: 136 mmol/L (ref 135–145)
Total Bilirubin: 1.1 mg/dL (ref 0.3–1.2)
Total Protein: 6.2 g/dL — ABNORMAL LOW (ref 6.5–8.1)

## 2021-06-29 LAB — TRIGLYCERIDES: Triglycerides: 189 mg/dL — ABNORMAL HIGH (ref <150)

## 2021-06-29 LAB — GLUCOSE, CAPILLARY
Glucose-Capillary: 75 mg/dL (ref 70–99)
Glucose-Capillary: 92 mg/dL (ref 70–99)
Glucose-Capillary: 88 mg/dL (ref 70–99)

## 2021-06-29 LAB — LIPASE, BLOOD: Lipase: 28 U/L (ref 11–51)

## 2021-06-29 MED ORDER — ALPRAZOLAM 0.25 MG PO TABS
0.2500 mg | ORAL_TABLET | Freq: Three times a day (TID) | ORAL | Status: DC | PRN
  Administered 2021-06-29: 0.25 mg via ORAL
  Filled 2021-06-29: qty 1

## 2021-06-29 MED ORDER — OXYCODONE HCL 5 MG PO TABS: 5.0000 mg | ORAL_TABLET | Freq: Three times a day (TID) | ORAL | 0 refills | Status: AC | PRN

## 2021-06-29 MED ORDER — ONDANSETRON HCL 4 MG PO TABS: 4.0000 mg | ORAL_TABLET | Freq: Every day | ORAL | 0 refills | Status: DC | PRN

## 2021-06-29 NOTE — Discharge Summary (Signed)
Physician Discharge Summary  Shaun Odonnell [id number] DOB: 1966/07/17 DOA: 06/28/2021  PCP: Gwyneth Sprout, FNP  Admit date: 06/28/2021 Discharge date: 06/29/2021  Admitted From: Home Disposition: Home  Recommendations for Outpatient Follow-up:  Follow up with PCP in 1-2 weeks Follow-up as directed with GI  Home Health: No Equipment/Devices: None  Discharge Condition: Stable CODE STATUS: Full Diet recommendation: Soft/bland  Brief/Interim Summary: 55 y.o. male with medical history significant of pancreatitis, hyperlipidemia, diabetes mellitus, COPD, depression, anxiety, PE 2003 not on anticoagulants, schizophrenia, bipolar, depression, anxiety, alcohol abuse, tobacco abuse, who presents with abdominal pain.   Patient states that his abdomen started yesterday morning, which is located in upper and middle abdomen, constant, 9-10 out of 10 in severity, aching, radiating to bilateral flank area.  Patient has nausea, no vomiting or diarrhea.  No fever or chills.  Patient has mild dry cough and mild shortness breath, no chest pain.  Denies symptoms of UTI. Pt was have lipase 72. CT showed a probable 6 mm calculus seen in main pancreatic duct in the pancreatic head resulting in pancreatic ductal dilatation.  He also has a 3.7 cystic abnormality in the pancreatic tail which is slightly enlarged compared to prior. EDP consulted Dr. Allen Norris of GI, but pt left hospital AMA before Dr. Leonides Schanz saw patient yesterday.    Per EDP, Dr. Allen Norris "recommended transfer to tertiary care center.  [facility] was not able to accept patient due to bed availability.  Dr. Rush Landmark, hepatobiliary specialist at [facility] was consulted and told Dr. Charna Archer that this does not need any surgical intervention or an ERCP and would be treated like any other type of pancreatitis with symptomatic management and therefore could be managed here at [facility]   Patient was admitted to [facility] and treated conservatively with IV fluids, pain control,  antinausea, bowel rest.  Lipase and triglycerides downtrending.  Patient hemodynamically stable.  Has been calling Dr. Dorothey Baseman office to request evaluation and consultation.  I have reached out to Dr. Allen Norris who who indicated that pancreatic ERCP is not performed at this facility.  Patient is cleared for discharge.  Pancreatitis has resolved both clinically and per laboratory data.  Patient will follow-up outpatient with GI in [facility] for further evaluation and possible definitive treatment of pancreatic duct stone.   Discharge Diagnoses:  Principal Problem:   Acute pancreatitis Active Problems:   Chronic obstructive pulmonary disease ([place])   Alcohol abuse   Bipolar depression ([place])   Hyperlipidemia associated with type 2 diabetes mellitus (HCC)   Pancreatic duct stones   Diabetes mellitus without complication ([place])   Tobacco abuse   Schizoaffective disorder ([place])   Hypertriglyceridemia Acute pancreatitis associated with pancreatic duct stone Initial recommendation was for transfer to per tertiary care center however [facility] not excepting patients due to bed availability and hepatobiliary specialist at [facility] was consulted by EDP and told [facility] ED physician that pancreatic stone would not require any intervention or ERCP and would be treated like any other type of pancreatitis with conservative and symptomatic management.  Plan: Okay for discharge.  Recommend starting with clear liquid diet and gradually advancing.  Avoid fried and fatty foods.  Avoid smoking.  Follow-up outpatient with GI in [place].   Chronic obstructive pulmonary disease  -Bronchodilators   Alcohol abuse and tobacco abuse -Did counseling about importance of quitting alcohol use and tobacco use   Bipolar depression  and schizoaffective disorder -Continue home Xanax, olanzapine, Zoloft   Hyperlipidemia associated with type 2 diabetes mellitus Patient  is taking Zocor at home.    Diabetes mellitus without  complication  Recent 123456 11.2, poorly controlled.   Patient is not taking Invokana, Trulicity and Lantus Discontinue Trulicity on discharge.  Follow-up outpatient PCP   Discharge Instructions  Discharge Instructions     Diet - low sodium heart healthy   Complete by: As directed    Increase activity slowly   Complete by: As directed       Allergies as of 06/29/2021       Reactions   Fentanyl Nausea And Vomiting   Other reaction(s): Unknown   Morphine And Related Shortness Of Breath   Clonazepam Other (See Comments)   Dystonic Reaction Other reaction(s): Other (See Comments) "Dystonic"; alprazolam is a home med "Dystonic"; alprazolam is a home med Dystonic Reaction   Pimozide Other (See Comments)   Dystonic Reaction Other reaction(s): Other (See Comments) "Distonic" "Distonic" Dystonic Reaction   Trifluoperazine Other (See Comments)   Dystonic Reaction Other reaction(s): Other (See Comments) "Distonic" "Distonic" Dystonic reaction Dystonic Reaction   Azithromycin Other (See Comments)   Was told not to take Z pak due to his heart   Montelukast    headache, nausea, feeling a little anxious, panic   Morphine Other (See Comments)   Other reaction(s): Other (See Comments) " I stop breathing"; can take Diluadid " I stop breathing"; can take Diluadid Cnnot tolerate d/t Drug Metabolism   Tramadol Nausea Only        Medication List     STOP taking these medications    Azelastine-Fluticasone 137-50 MCG/ACT Susp   CENTRUM SILVER PO   diclofenac Sodium 1 % Gel Commonly known as: VOLTAREN   metFORMIN 500 MG 24 hr tablet Commonly known as: GLUCOPHAGE-XR   Trulicity 3 0000000 Sopn Generic drug: Dulaglutide       TAKE these medications    albuterol 108 (90 Base) MCG/ACT inhaler Commonly known as: VENTOLIN HFA INHALE 2 PUFFS BY MOUTH EVERY 6 HOURS ASNEEDED WHEEZING/ SHORTNESS OF BREATH   albuterol (2.5 MG/3ML) 0.083% nebulizer solution Commonly known  as: PROVENTIL Take 3 mLs (2.5 mg total) by nebulization every 6 (six) hours as needed for wheezing or shortness of breath.   ALPRAZolam 0.5 MG tablet Commonly known as: XANAX Take 0.5 mg by mouth 4 (four) times daily. Patient takes when wakes up(~0600)/ 1000/ 1600/ 2000   budesonide-formoterol 80-4.5 MCG/ACT inhaler Commonly known as: Symbicort Inhale 2 puffs into the lungs in the morning and at bedtime.   carboxymethylcellulose 0.5 % Soln Commonly known as: REFRESH PLUS Place 2 drops into both eyes as needed (Dry eyes).   carboxymethylcellulose 0.5 % Soln Commonly known as: REFRESH PLUS Apply to eye.   glucose blood test strip Use as instructed   Invokana 300 MG Tabs tablet Generic drug: canagliflozin TAKE 1 TABLET BY MOUTH ONCE DAILY BEFOREBREAFKAST   Lantus SoloStar 100 UNIT/ML Solostar Pen Generic drug: insulin glargine INJECT 10 UNITS SUBCUTANEOUSLY AT BEDTIME   meclizine 25 MG tablet Commonly known as: ANTIVERT TAKE 2 TABLETS BY MOUTH 3 TIMES DAILY ASNEEDED   OLANZapine 20 MG tablet Commonly known as: ZYPREXA What changed: Another medication with the same name was removed. Continue taking this medication, and follow the directions you see here.   OLANZapine 20 MG tablet Commonly known as: ZYPREXA Take 20 mg by mouth at bedtime. What changed: Another medication with the same name was removed. Continue taking this medication, and follow the directions you see here.   ondansetron 4 MG tablet Commonly  known as: Zofran Take 1 tablet (4 mg total) by mouth daily as needed for nausea or vomiting.   ondansetron 8 MG disintegrating tablet Commonly known as: ZOFRAN-ODT Take 1 tablet (8 mg total) by mouth every 8 (eight) hours as needed for nausea or vomiting.   OneTouch Delica Lancets [id number] Misc USE TO CHECK FASTING SUGAR TWICE DAILY   oxyCODONE 5 MG immediate release tablet Commonly known as: Roxicodone Take 1 tablet (5 mg total) by mouth every 8 (eight) hours as  needed for up to 2 days.   sertraline 100 MG tablet Commonly known as: ZOLOFT Take 100 mg by mouth daily.   sertraline 100 MG tablet Commonly known as: ZOLOFT Take by mouth.   simvastatin 80 MG tablet Commonly known as: ZOCOR TAKE 1 TABLET BY MOUTH AT BEDTIME   Unifine Pentips 32G X 4 MM Misc Generic drug: Insulin Pen Needle USE WITH INSULIN ONCE DAILY        Allergies  Allergen Reactions   Fentanyl Nausea And Vomiting    Other reaction(s): Unknown   Morphine And Related Shortness Of Breath   Clonazepam Other (See Comments)    Dystonic Reaction   Other reaction(s): Other (See Comments) "Dystonic"; alprazolam is a home med "Dystonic"; alprazolam is a home med Dystonic Reaction   Pimozide Other (See Comments)    Dystonic Reaction  Other reaction(s): Other (See Comments) "Distonic" "Distonic" Dystonic Reaction   Trifluoperazine Other (See Comments)    Dystonic Reaction Other reaction(s): Other (See Comments) "Distonic" "Distonic" Dystonic reaction Dystonic Reaction   Azithromycin Other (See Comments)    Was told not to take Z pak due to his heart   Montelukast     headache, nausea, feeling a little anxious, panic   Morphine Other (See Comments)    Other reaction(s): Other (See Comments) " I stop breathing"; can take Diluadid " I stop breathing"; can take Diluadid Cnnot tolerate d/t Drug Metabolism   Tramadol Nausea Only    Consultations: GI   Procedures/Studies: CT Abdomen Pelvis W Contrast  Result Date: 06/27/2021 CLINICAL DATA:  Acute upper abdominal pain. History of pancreatitis. EXAM: CT ABDOMEN AND PELVIS WITH CONTRAST TECHNIQUE: Multidetector CT imaging of the abdomen and pelvis was performed using the standard protocol following bolus administration of intravenous contrast. CONTRAST:  62m OMNIPAQUE IOHEXOL 350 MG/ML SOLN COMPARISON:  May 11, 2019. FINDINGS: Lower chest: No acute abnormality. Hepatobiliary: No gallstones or biliary dilatation is  noted. Hepatic steatosis is noted. Pancreas: There appears to be a 6 mm calculus in the main pancreatic duct in the pancreatic head resulting in pancreatic ductal dilatation. No acute inflammation is noted. 3.7 cm cystic abnormality is seen arising from pancreatic tail with small peripheral solid component; this appears to be slightly enlarged compared to prior exam. Spleen: Normal in size without focal abnormality. Adrenals/Urinary Tract: Adrenal glands appear normal. Small nonobstructive left renal calculus is noted. No hydronephrosis or renal obstruction is noted. Urinary bladder is unremarkable. Stomach/Bowel: The stomach appears normal. There is no evidence of bowel obstruction or inflammation. The appendix is not clearly visualized. Stool is noted throughout the colon. Vascular/Lymphatic: Aortic atherosclerosis. No enlarged abdominal or pelvic lymph nodes. Reproductive: Prostate is unremarkable. Other: No abdominal wall hernia or abnormality. No abdominopelvic ascites. Musculoskeletal: No acute or significant osseous findings. IMPRESSION: Probable 6 mm calculus seen in main pancreatic duct in the pancreatic head resulting in pancreatic ductal dilatation. MRCP may be performed for further evaluation. Also noted is 3.7 cm cystic abnormality in the pancreatic  tail which is slightly enlarged compared to prior exam and demonstrates small peripheral solid component. Further evaluation with MRI with and without gadolinium is recommended. Hepatic steatosis. Small nonobstructive left renal calculus. Aortic Atherosclerosis (ICD10-I70.0). Electronically Signed   By: Marijo Conception M.D.   On: 06/27/2021 10:05   MR ABDOMEN MRCP W WO CONTAST  Result Date: 06/28/2021 CLINICAL DATA:  Pancreatitis suspected, abnormal CT, pancreatic duct calculus suspected EXAM: MRI ABDOMEN WITHOUT AND WITH CONTRAST (INCLUDING MRCP) TECHNIQUE: Multiplanar multisequence MR imaging of the abdomen was performed both before and after the  administration of intravenous contrast. Heavily T2-weighted images of the biliary and pancreatic ducts were obtained, and three-dimensional MRCP images were rendered by post processing. CONTRAST:  7.51m GADAVIST GADOBUTROL 1 MMOL/ML IV SOLN COMPARISON:  CT abdomen pelvis, 06/27/2021, 07/02/2018 FINDINGS: Lower chest: No acute findings. Hepatobiliary: Mild hepatic steatosis. No mass or other parenchymal abnormality identified. Pancreas: No mass, inflammatory changes, or other parenchymal abnormality identified. Nonenhancing, exophytic fluid signal cysts of the pancreatic tail measuring 3.3 cm (series 11, image 12). Prominent pancreatic duct measuring up to 0.6 cm in caliber (series 11, image 16). As on prior CT, suspect a calculus in the central pancreatic duct measuring approximately 0.5 cm (series 18, image 4); calcified and therefore better appreciated by prior CT. Spleen:  Within normal limits in size and appearance. Adrenals/Urinary Tract: No masses identified. No evidence of hydronephrosis. Stomach/Bowel: Visualized portions within the abdomen are unremarkable. Vascular/Lymphatic: No pathologically enlarged lymph nodes identified. No abdominal aortic aneurysm demonstrated. Other:  None. Musculoskeletal: No suspicious bone lesions identified. IMPRESSION: 1. Prominent pancreatic duct measuring up to 0.6 cm in caliber. As on prior CT, suspect a calculus in the central pancreatic duct measuring approximately 0.5 cm. This was calcified and therefore better appreciated by prior CT. 2. No biliary tract calculi identified. No biliary ductal dilatation. 3. No acute inflammatory findings. 4. There is a 3.3 cm nonenhancing, exophytic fluid signal cyst of the pancreatic tail, consistent with a pancreatic pseudocyst given history of pancreatitis. This is stable on multiple prior examinations dating back to at least 2019 and benign. No specific further follow-up or characterization is required. 5. Mild hepatic steatosis.  Electronically Signed   By: AEddie CandleM.D.   On: 06/28/2021 09:52   Pulmonary Function Test [facility] Only  Result Date: 06/13/2021 Spirometry Data Is Acceptable and Reproducible Mild/Moderate  Obstructive Airways Disease with Significant Broncho-Dilator Response Consider outpatient Pulmonary Consultation if needed Clinical Correlation Advised   (Echo, Carotid, EGD, Colonoscopy, ERCP)    Subjective: Seen and examined on day of discharge.  Abdominal pain improved.  Stable for discharge home.  Discharge Exam: Vitals:   06/29/21 0516 06/29/21 0734  BP: 134/77 118/68  Pulse: 70 69  Resp: 15 18  Temp: 97.9 F (36.6 C) 98 F (36.7 C)  SpO2: 95% 90%   Vitals:   06/28/21 1524 06/28/21 1656 06/29/21 0516 06/29/21 0734  BP: 121/81 124/74 134/77 118/68  Pulse: (!) 55 (!) 54 70 69  Resp:  '14 15 18  '$ Temp:  97.6 F (36.4 C) 97.9 F (36.6 C) 98 F (36.7 C)  TempSrc:    Oral  SpO2:  95% 95% 90%  Weight:      Height:        General: Pt is alert, awake, not in acute distress Cardiovascular: RRR, S1/S2 +, no rubs, no gallops Respiratory: CTA bilaterally, no wheezing, no rhonchi Abdominal: Soft, NT, ND, bowel sounds + Extremities: no edema, no cyanosis  The results of significant diagnostics from this hospitalization (including imaging, microbiology, ancillary and laboratory) are listed below for reference.     Microbiology: Recent Results (from the past 240 hour(s))  Resp Panel by RT-PCR (Flu A&B, Covid) Nasopharyngeal Swab     Status: None   Collection Time: 06/27/21 11:05 AM   Specimen: Nasopharyngeal Swab; Nasopharyngeal(NP) swabs in vial transport medium  Result Value Ref Range Status   SARS Coronavirus 2 by RT PCR NEGATIVE NEGATIVE Final    Comment: (NOTE) SARS-CoV-2 target nucleic acids are NOT DETECTED.  The SARS-CoV-2 RNA is generally detectable in upper respiratory specimens during the acute phase of infection. The lowest concentration of SARS-CoV-2 viral copies this  assay can detect is 138 copies/mL. A negative result does not preclude SARS-Cov-2 infection and should not be used as the sole basis for treatment or other patient management decisions. A negative result may occur with  improper specimen collection/handling, submission of specimen other than nasopharyngeal swab, presence of viral mutation(s) within the areas targeted by this assay, and inadequate number of viral copies(<138 copies/mL). A negative result must be combined with clinical observations, patient history, and epidemiological information. The expected result is Negative.  Fact Sheet for Patients:  EntrepreneurPulse.com.au  Fact Sheet for Healthcare Providers:  IncredibleEmployment.be  This test is no t yet approved or cleared by the [name] FDA and  has been authorized for detection and/or diagnosis of SARS-CoV-2 by FDA under an Emergency Use Authorization (EUA). This EUA will remain  in effect (meaning this test can be used) for the duration of the COVID-19 declaration under Section 564(b)(1) of the Act, 21 U.S.C.section 360bbb-3(b)(1), unless the authorization is terminated  or revoked sooner.       Influenza A by PCR NEGATIVE NEGATIVE Final   Influenza B by PCR NEGATIVE NEGATIVE Final    Comment: (NOTE) The Xpert Xpress SARS-CoV-2/FLU/RSV plus assay is intended as an aid in the diagnosis of influenza from Nasopharyngeal swab specimens and should not be used as a sole basis for treatment. Nasal washings and aspirates are unacceptable for Xpert Xpress SARS-CoV-2/FLU/RSV testing.  Fact Sheet for Patients: EntrepreneurPulse.com.au  Fact Sheet for Healthcare Providers: IncredibleEmployment.be  This test is not yet approved or cleared by the [name] FDA and has been authorized for detection and/or diagnosis of SARS-CoV-2 by FDA under an Emergency Use Authorization (EUA). This EUA will  remain in effect (meaning this test can be used) for the duration of the COVID-19 declaration under Section 564(b)(1) of the Act, 21 U.S.C. section 360bbb-3(b)(1), unless the authorization is terminated or revoked.  Performed at [facility], [address] [place], [place] [address]   Resp Panel by RT-PCR (Flu A&B, Covid) Nasopharyngeal Swab     Status: None   Collection Time: 06/28/21  8:22 AM   Specimen: Nasopharyngeal Swab; Nasopharyngeal(NP) swabs in vial transport medium  Result Value Ref Range Status   SARS Coronavirus 2 by RT PCR NEGATIVE NEGATIVE Final    Comment: (NOTE) SARS-CoV-2 target nucleic acids are NOT DETECTED.  The SARS-CoV-2 RNA is generally detectable in upper respiratory specimens during the acute phase of infection. The lowest concentration of SARS-CoV-2 viral copies this assay can detect is 138 copies/mL. A negative result does not preclude SARS-Cov-2 infection and should not be used as the sole basis for treatment or other patient management decisions. A negative result may occur with  improper specimen collection/handling, submission of specimen other than nasopharyngeal swab, presence of viral mutation(s) within the areas targeted  by this assay, and inadequate number of viral copies(<138 copies/mL). A negative result must be combined with clinical observations, patient history, and epidemiological information. The expected result is Negative.  Fact Sheet for Patients:  EntrepreneurPulse.com.au  Fact Sheet for Healthcare Providers:  IncredibleEmployment.be  This test is no t yet approved or cleared by the [name] FDA and  has been authorized for detection and/or diagnosis of SARS-CoV-2 by FDA under an Emergency Use Authorization (EUA). This EUA will remain  in effect (meaning this test can be used) for the duration of the COVID-19 declaration under Section 564(b)(1) of the Act, 21 U.S.C.section  360bbb-3(b)(1), unless the authorization is terminated  or revoked sooner.       Influenza A by PCR NEGATIVE NEGATIVE Final   Influenza B by PCR NEGATIVE NEGATIVE Final    Comment: (NOTE) The Xpert Xpress SARS-CoV-2/FLU/RSV plus assay is intended as an aid in the diagnosis of influenza from Nasopharyngeal swab specimens and should not be used as a sole basis for treatment. Nasal washings and aspirates are unacceptable for Xpert Xpress SARS-CoV-2/FLU/RSV testing.  Fact Sheet for Patients: EntrepreneurPulse.com.au  Fact Sheet for Healthcare Providers: IncredibleEmployment.be  This test is not yet approved or cleared by the [name] FDA and has been authorized for detection and/or diagnosis of SARS-CoV-2 by FDA under an Emergency Use Authorization (EUA). This EUA will remain in effect (meaning this test can be used) for the duration of the COVID-19 declaration under Section 564(b)(1) of the Act, 21 U.S.C. section 360bbb-3(b)(1), unless the authorization is terminated or revoked.  Performed at [facility] [address] [place], [place] [address]      Labs: BNP (last 3 results) No results for input(s): BNP in the last 8760 hours. Basic Metabolic Panel: Recent Labs  Lab 06/27/21 0429 06/28/21 0406 06/29/21 0715  NA 133* 130* 136  K 4.1 4.1 4.3  CL 93* 94* 102  CO2 '29 25 26  '$ GLUCOSE 331* 424* 104*  BUN '14 10 7  '$ CREATININE 0.75 0.68 0.60*  CALCIUM 9.4 9.2 8.6*   Liver Function Tests: Recent Labs  Lab 06/27/21 0429 06/28/21 0406 06/29/21 0715  AST '16 17 15  '$ ALT '20 18 16  '$ ALKPHOS 103 95 76  BILITOT 0.8 0.8 1.1  PROT 7.4 7.1 6.2*  ALBUMIN 4.5 4.3 3.6   Recent Labs  Lab 06/27/21 0429 06/28/21 0406 06/29/21 0715  LIPASE 78* 72* 28   No results for input(s): AMMONIA in the last 168 hours. CBC: Recent Labs  Lab 06/27/21 0429 06/28/21 0406  WBC 10.0 8.2  NEUTROABS 6.9  --   HGB 18.2* 17.3*  HCT 52.0  48.6  MCV 97.0 96.8  PLT 167 150   Cardiac Enzymes: No results for input(s): CKTOTAL, CKMB, CKMBINDEX, TROPONINI in the last 168 hours. BNP: Invalid input(s): POCBNP CBG: Recent Labs  Lab 06/28/21 1657 06/28/21 2024 06/29/21 0520 06/29/21 0734 06/29/21 1141  GLUCAP 159* 86 75 92 88   D-Dimer No results for input(s): DDIMER in the last 72 hours. Hgb A1c No results for input(s): HGBA1C in the last 72 hours. Lipid Profile Recent Labs    06/28/21 0420 06/29/21 0715  TRIG 466* 189*   Thyroid function studies No results for input(s): TSH, T4TOTAL, T3FREE, THYROIDAB in the last 72 hours.  Invalid input(s): FREET3 Anemia work up No results for input(s): VITAMINB12, FOLATE, FERRITIN, TIBC, IRON, RETICCTPCT in the last 72 hours. Urinalysis    Component Value Date/Time   COLORURINE YELLOW (A) 06/28/2021 0406  APPEARANCEUR CLEAR (A) 06/28/2021 0406   APPEARANCEUR Clear 11/26/2014 0336   LABSPEC 1.025 06/28/2021 0406   LABSPEC 1.002 11/26/2014 0336   PHURINE 6.0 06/28/2021 0406   GLUCOSEU >=500 (A) 06/28/2021 0406   GLUCOSEU Negative 11/26/2014 0336   HGBUR NEGATIVE 06/28/2021 0406   BILIRUBINUR NEGATIVE 06/28/2021 0406   BILIRUBINUR Negative 06/30/2019 1435   BILIRUBINUR Negative 11/26/2014 0336   KETONESUR 5 (A) 06/28/2021 0406   PROTEINUR NEGATIVE 06/28/2021 0406   UROBILINOGEN 0.2 06/30/2019 1435   NITRITE NEGATIVE 06/28/2021 0406   LEUKOCYTESUR NEGATIVE 06/28/2021 0406   LEUKOCYTESUR Negative 11/26/2014 0336   Sepsis Labs Invalid input(s): PROCALCITONIN,  WBC,  LACTICIDVEN Microbiology Recent Results (from the past 240 hour(s))  Resp Panel by RT-PCR (Flu A&B, Covid) Nasopharyngeal Swab     Status: None   Collection Time: 06/27/21 11:05 AM   Specimen: Nasopharyngeal Swab; Nasopharyngeal(NP) swabs in vial transport medium  Result Value Ref Range Status   SARS Coronavirus 2 by RT PCR NEGATIVE NEGATIVE Final    Comment: (NOTE) SARS-CoV-2 target nucleic acids  are NOT DETECTED.  The SARS-CoV-2 RNA is generally detectable in upper respiratory specimens during the acute phase of infection. The lowest concentration of SARS-CoV-2 viral copies this assay can detect is 138 copies/mL. A negative result does not preclude SARS-Cov-2 infection and should not be used as the sole basis for treatment or other patient management decisions. A negative result may occur with  improper specimen collection/handling, submission of specimen other than nasopharyngeal swab, presence of viral mutation(s) within the areas targeted by this assay, and inadequate number of viral copies(<138 copies/mL). A negative result must be combined with clinical observations, patient history, and epidemiological information. The expected result is Negative.  Fact Sheet for Patients:  EntrepreneurPulse.com.au  Fact Sheet for Healthcare Providers:  IncredibleEmployment.be  This test is no t yet approved or cleared by the [name] FDA and  has been authorized for detection and/or diagnosis of SARS-CoV-2 by FDA under an Emergency Use Authorization (EUA). This EUA will remain  in effect (meaning this test can be used) for the duration of the COVID-19 declaration under Section 564(b)(1) of the Act, 21 U.S.C.section 360bbb-3(b)(1), unless the authorization is terminated  or revoked sooner.       Influenza A by PCR NEGATIVE NEGATIVE Final   Influenza B by PCR NEGATIVE NEGATIVE Final    Comment: (NOTE) The Xpert Xpress SARS-CoV-2/FLU/RSV plus assay is intended as an aid in the diagnosis of influenza from Nasopharyngeal swab specimens and should not be used as a sole basis for treatment. Nasal washings and aspirates are unacceptable for Xpert Xpress SARS-CoV-2/FLU/RSV testing.  Fact Sheet for Patients: EntrepreneurPulse.com.au  Fact Sheet for Healthcare Providers: IncredibleEmployment.be  This test is  not yet approved or cleared by the [name] FDA and has been authorized for detection and/or diagnosis of SARS-CoV-2 by FDA under an Emergency Use Authorization (EUA). This EUA will remain in effect (meaning this test can be used) for the duration of the COVID-19 declaration under Section 564(b)(1) of the Act, 21 U.S.C. section 360bbb-3(b)(1), unless the authorization is terminated or revoked.  Performed at [facility], [address] [place], [place] [address]   Resp Panel by RT-PCR (Flu A&B, Covid) Nasopharyngeal Swab     Status: None   Collection Time: 06/28/21  8:22 AM   Specimen: Nasopharyngeal Swab; Nasopharyngeal(NP) swabs in vial transport medium  Result Value Ref Range Status   SARS Coronavirus 2 by RT PCR NEGATIVE NEGATIVE Final  Comment: (NOTE) SARS-CoV-2 target nucleic acids are NOT DETECTED.  The SARS-CoV-2 RNA is generally detectable in upper respiratory specimens during the acute phase of infection. The lowest concentration of SARS-CoV-2 viral copies this assay can detect is 138 copies/mL. A negative result does not preclude SARS-Cov-2 infection and should not be used as the sole basis for treatment or other patient management decisions. A negative result may occur with  improper specimen collection/handling, submission of specimen other than nasopharyngeal swab, presence of viral mutation(s) within the areas targeted by this assay, and inadequate number of viral copies(<138 copies/mL). A negative result must be combined with clinical observations, patient history, and epidemiological information. The expected result is Negative.  Fact Sheet for Patients:  EntrepreneurPulse.com.au  Fact Sheet for Healthcare Providers:  IncredibleEmployment.be  This test is no t yet approved or cleared by the [name] FDA and  has been authorized for detection and/or diagnosis of SARS-CoV-2 by FDA under an Emergency Use  Authorization (EUA). This EUA will remain  in effect (meaning this test can be used) for the duration of the COVID-19 declaration under Section 564(b)(1) of the Act, 21 U.S.C.section 360bbb-3(b)(1), unless the authorization is terminated  or revoked sooner.       Influenza A by PCR NEGATIVE NEGATIVE Final   Influenza B by PCR NEGATIVE NEGATIVE Final    Comment: (NOTE) The Xpert Xpress SARS-CoV-2/FLU/RSV plus assay is intended as an aid in the diagnosis of influenza from Nasopharyngeal swab specimens and should not be used as a sole basis for treatment. Nasal washings and aspirates are unacceptable for Xpert Xpress SARS-CoV-2/FLU/RSV testing.  Fact Sheet for Patients: EntrepreneurPulse.com.au  Fact Sheet for Healthcare Providers: IncredibleEmployment.be  This test is not yet approved or cleared by the [name] FDA and has been authorized for detection and/or diagnosis of SARS-CoV-2 by FDA under an Emergency Use Authorization (EUA). This EUA will remain in effect (meaning this test can be used) for the duration of the COVID-19 declaration under Section 564(b)(1) of the Act, 21 U.S.C. section 360bbb-3(b)(1), unless the authorization is terminated or revoked.  Performed at [facility], [address] [place], [place] [address]      Time coordinating discharge: Over 30 minutes  SIGNED:   Sidney Ace, MD  [facility] 06/29/2021, 3:15 PM Pager   If 7PM-7AM, please contact night-coverage

## 2021-06-29 NOTE — Progress Notes (Signed)
Mobility Specialist - Progress Note   06/29/21 1600  Mobility  Activity Ambulated in hall  Level of Assistance Independent  Assistive Device None  Distance Ambulated (ft) 80 ft  Mobility Ambulated independently in hallway  Mobility Response Tolerated well  Mobility performed by Mobility specialist  $Mobility charge 1 Mobility    Pt ambulated in hallway independently, no LOB. No AD. No complaints.    Kathee Delton Mobility Specialist 06/29/21, 4:44 PM

## 2021-06-29 NOTE — Discharge Instructions (Signed)
Recommend starting with a liquid diet.  Small amounts.  If you tolerate without abdominal pain or nausea you can slowly start to advance her diet.  I recommend adhering to a soft bland diet until your symptoms have entirely resolved.

## 2021-06-29 NOTE — Consult Note (Signed)
Shaun Lame, MD [facility]  [address] [place], [place] [address] Phone: [phone] Fax : [phone]  Consultation  Referring Provider:     No ref. provider found Primary Care Physician:  Gwyneth Sprout, FNP Primary Gastroenterologist:  Dr. Allen Norris         Reason for Consultation:     Abdominal pain  Date of Admission:  06/28/2021 Date of Consultation:  06/29/2021         HPI:   Shaun Odonnell is a 55 y.o. male who comes in with a history of pancreatitis from alcohol abuse who continues to drink although has cut down substantially.  The patient also smokes cigarettes and has a history of a PE COPD depression anxiety diabetes hyperlipidemia and schizophrenia.  The patient was in the ER yesterday and was found to have a stone in the central pancreatic duct and attempt was made to transfer him to [place].  Upon speaking to the specialist at [facility] it was determined to treat the patient symptomatically at our hospital and an urgent ERCP was not needed.  It was recommended that he follow-up as an outpatient for a possible ERCP with [place] stone removal.  The patient had left AMA yesterday but decided to come back after he got home and the abdominal pain returned.  The patient was also noted to have a cyst in the tail of the pancreas. The patient reports that he feels better today and states that he has not been able to sleep in the hospital and would like to go home.  He has been getting pain medication and IV fluids.  The patient is accompanied by both his mother and father in the room who reiterates that he does not take good care of himself and continues to drink.  Past Medical History:  Diagnosis Date   Alcohol abuse 04/29/2019   Allergy    Anxiety    ARDS (adult respiratory distress syndrome) (HCC)    ARDS (adult respiratory distress syndrome) (HCC)    Bipolar disorder (HCC)    Borderline diabetes    Cataract    Chronic kidney disease    COPD (chronic obstructive  pulmonary disease) (HCC)    chronic cough and wheezing   Depression    Diabetes mellitus without complication (HCC)    Dizziness    medication related   Dyspnea    with exertion   Elevated lipids    Fatty liver    Hypercholesterolemia    Hypertension    Pancreatitis    Pneumonia    in past   Pneumonia    in past   Pre-diabetes    diet controlled   Pulmonary embolism (HCC)    Schizoaffective disorder ([place])     Past Surgical History:  Procedure Laterality Date   CATARACT EXTRACTION W/PHACO Right 03/26/2018   Procedure: CATARACT EXTRACTION PHACO AND INTRAOCULAR LENS PLACEMENT ([place]) RIGHT BORDERLINE DIABETIC;  Surgeon: Leandrew Koyanagi, MD;  Location: [place];  Service: Ophthalmology;  Laterality: Right;   CATARACT EXTRACTION W/PHACO Left 04/23/2018   Procedure: CATARACT EXTRACTION PHACO AND INTRAOCULAR LENS PLACEMENT ([place])  LEFT BORDERLINE DIABETIC;  Surgeon: Leandrew Koyanagi, MD;  Location: [place];  Service: Ophthalmology;  Laterality: Left;   COLONOSCOPY WITH PROPOFOL N/A 08/21/2017   Procedure: COLONOSCOPY WITH PROPOFOL;  Surgeon: Manya Silvas, MD;  Location: [facility] ENDOSCOPY;  Service: Endoscopy;  Laterality: N/A;   EYE SURGERY     HERNIA REPAIR     inguinal and  umbilical   RIB RESECTION N/A 08/20/2019   Procedure: removal of xyphoid process;  Surgeon: Nestor Lewandowsky, MD;  Location: [facility] ORS;  Service: Thoracic;  Laterality: N/A;   UMBILICAL HERNIA REPAIR N/A 06/22/2019   Procedure: OPEN HERNIA REPAIR UMBILICAL ADULT;  Surgeon: Olean Ree, MD;  Location: [facility] ORS;  Service: General;  Laterality: N/A;   UPPER GI ENDOSCOPY      Prior to Admission medications   Medication Sig Start Date End Date Taking? Authorizing Provider  ALPRAZolam Duanne Moron) 0.5 MG tablet Take 0.5 mg by mouth 4 (four) times daily. Patient takes when wakes up(~0600)/ 1000/ 1600/ 2000 06/26/18  Yes [provider]  budesonide-formoterol (SYMBICORT) 80-4.5 MCG/ACT  inhaler Inhale 2 puffs into the lungs in the morning and at bedtime. 05/18/21  Yes Chesley Mires, MD  insulin glargine (LANTUS SOLOSTAR) 100 UNIT/ML Solostar Pen INJECT 10 UNITS SUBCUTANEOUSLY AT BEDTIME 05/29/21  Yes Bacigalupo, Dionne Bucy, MD  INVOKANA 300 MG TABS tablet TAKE 1 TABLET BY MOUTH ONCE DAILY BEFOREBREAFKAST 04/25/21  Yes Chrismon, Vickki Muff, PA-C  meclizine (ANTIVERT) 25 MG tablet TAKE 2 TABLETS BY MOUTH 3 TIMES DAILY ASNEEDED 06/26/21  Yes Gwyneth Sprout, FNP  OLANZapine (ZYPREXA) 20 MG tablet Take 20 mg by mouth at bedtime. 04/04/21  Yes [provider]  ondansetron (ZOFRAN) 4 MG tablet Take 1 tablet (4 mg total) by mouth daily as needed for nausea or vomiting. 06/29/21 06/29/22 Yes Sreenath, Sudheer B, MD  ondansetron (ZOFRAN-ODT) 8 MG disintegrating tablet Take 1 tablet (8 mg total) by mouth every 8 (eight) hours as needed for nausea or vomiting. 06/16/21  Yes Tally Joe T, FNP  oxyCODONE (ROXICODONE) 5 MG immediate release tablet Take 1 tablet (5 mg total) by mouth every 8 (eight) hours as needed for up to 2 days. 06/29/21 07/01/21 Yes Sreenath, Sudheer B, MD  sertraline (ZOLOFT) 100 MG tablet Take 100 mg by mouth daily.    Yes [provider]  simvastatin (ZOCOR) 80 MG tablet TAKE 1 TABLET BY MOUTH AT BEDTIME 05/17/21  Yes Bacigalupo, Dionne Bucy, MD  albuterol (PROVENTIL) (2.5 MG/3ML) 0.083% nebulizer solution Take 3 mLs (2.5 mg total) by nebulization every 6 (six) hours as needed for wheezing or shortness of breath. 05/18/21   Chesley Mires, MD  albuterol (VENTOLIN HFA) 108 (90 Base) MCG/ACT inhaler INHALE 2 PUFFS BY MOUTH EVERY 6 HOURS ASNEEDED WHEEZING/ SHORTNESS OF BREATH 03/02/21   Birdie Sons, MD  Azelastine-Fluticasone [phone] MCG/ACT SUSP USE 1 SPRAY INTO EACH NOSTRIL TWICE DAILY AS DIRECTED Patient not taking: No sig reported 06/02/21   Jerrol Banana., MD  carboxymethylcellulose (REFRESH PLUS) 0.5 % SOLN Place 2 drops into both eyes as needed (Dry eyes).     [provider]  carboxymethylcellulose (REFRESH PLUS) 0.5 % SOLN Apply to eye.    [provider]  diclofenac Sodium (VOLTAREN) 1 % GEL Apply topically. Patient not taking: Reported on 06/28/2021    [provider]  Dulaglutide (TRULICITY) 3 0000000 SOPN Inject 3 mg as directed once a week. 06/12/21   Virginia Crews, MD  glucose blood test strip Use as instructed 12/17/19   Trinna Post, PA-C  Insulin Pen Needle (UNIFINE PENTIPS) 32G X 4 MM MISC USE WITH INSULIN ONCE DAILY 02/08/21   Mar Daring, PA-C  metFORMIN (GLUCOPHAGE-XR) 500 MG 24 hr tablet Take 2 tablets (1,000 mg total) by mouth 2 (two) times daily. Patient not taking: Reported on 06/28/2021 04/24/21   Virginia Crews, MD  Multiple Vitamins-Minerals (CENTRUM SILVER PO) Take 1 tablet by mouth daily. Patient not taking: Reported on 06/28/2021    [provider]  OLANZapine (ZYPREXA) 15 MG tablet Take 15 mg by mouth at bedtime. At 2000 Patient not taking: Reported on 06/28/2021    [provider]  OLANZapine (ZYPREXA) 20 MG tablet  02/21/21   [provider]  OneTouch Delica Lancets [phone] MISC USE TO CHECK FASTING SUGAR TWICE DAILY 12/07/20   Trinna Post, PA-C  sertraline (ZOLOFT) 100 MG tablet Take by mouth.    [provider]    Family History  Problem Relation Age of Onset   Hyperlipidemia Mother    Hypertension Father    Dementia Maternal Grandmother    Heart disease Maternal Grandfather    Heart disease Paternal Grandmother    Stroke Paternal Grandfather    Diabetes Paternal Grandfather    Diabetes Maternal Aunt      Social History   Tobacco Use   Smoking status: Every Day    Packs/day: 1.50    Years: 34.00    Pack years: 51.00    Types: Cigarettes   Smokeless tobacco: Former    Types: Snuff  Vaping Use   Vaping Use: Former  Substance Use Topics   Alcohol use: Yes    Alcohol/week: 6.0 standard drinks    Types: 6 Cans of beer per  week    Comment: 3 beers maybe 2x a week   Drug use: Not Currently    Allergies as of 06/28/2021 - Review Complete 06/28/2021  Allergen Reaction Noted   Fentanyl Nausea And Vomiting 01/12/2019   Morphine and related Shortness Of Breath 06/02/2015   Clonazepam Other (See Comments) 10/16/2014   Pimozide Other (See Comments) 10/16/2014   Trifluoperazine Other (See Comments) 03/11/2014   Azithromycin Other (See Comments) 12/10/2019   Montelukast  07/05/2020   Morphine Other (See Comments) 03/11/2014   Tramadol Nausea Only 09/30/2019    Review of Systems:    All systems reviewed and negative except where noted in HPI.   Physical Exam:  Vital signs in last 24 hours: Temp:  [97.6 F (36.4 C)-98 F (36.7 C)] 98 F (36.7 C) (08/25 0734) Pulse Rate:  [54-70] 69 (08/25 0734) Resp:  [14-18] 18 (08/25 0734) BP: (118-134)/(68-81) 118/68 (08/25 0734) SpO2:  [90 %-95 %] 90 % (08/25 0734) Last BM Date: 06/26/21 General:   Pleasant, cooperative in NAD Head:  Normocephalic and atraumatic. Eyes:   No icterus.   Conjunctiva pink. PERRLA. Ears:  Normal auditory acuity. Neck:  Supple; no masses or thyroidomegaly Lungs: Respirations even and unlabored. Lungs clear to auscultation bilaterally.   No wheezes, crackles, or rhonchi.  Heart:  Regular rate and rhythm;  Without murmur, clicks, rubs or gallops Abdomen:  Soft, nondistended, nontender. Normal bowel sounds. No appreciable masses or hepatomegaly.  No rebound or guarding.  Rectal:  Not performed. Msk:  Symmetrical without gross deformities.    Extremities:  Without edema, cyanosis or clubbing. Neurologic:  Alert and oriented x3;  grossly normal neurologically. Skin:  Intact without significant lesions or rashes. Cervical Nodes:  No significant cervical adenopathy. Psych:  Alert and cooperative. Normal affect.  LAB RESULTS: Recent Labs    06/27/21 0429 06/28/21 0406  WBC 10.0 8.2  HGB 18.2* 17.3*  HCT 52.0 48.6  PLT 167 150    BMET Recent Labs    06/27/21 0429 06/28/21 0406 06/29/21 0715  NA 133* 130* 136  K 4.1 4.1 4.3  CL 93*  94* 102  CO2 '29 25 26  '$ GLUCOSE 331* 424* 104*  BUN '14 10 7  '$ CREATININE 0.75 0.68 0.60*  CALCIUM 9.4 9.2 8.6*   LFT Recent Labs    06/29/21 0715  PROT 6.2*  ALBUMIN 3.6  AST 15  ALT 16  ALKPHOS 76  BILITOT 1.1   PT/INR Recent Labs    06/28/21 1656  LABPROT 12.4  INR 0.9    STUDIES: MR ABDOMEN MRCP W WO CONTAST  Result Date: 06/28/2021 CLINICAL DATA:  Pancreatitis suspected, abnormal CT, pancreatic duct calculus suspected EXAM: MRI ABDOMEN WITHOUT AND WITH CONTRAST (INCLUDING MRCP) TECHNIQUE: Multiplanar multisequence MR imaging of the abdomen was performed both before and after the administration of intravenous contrast. Heavily T2-weighted images of the biliary and pancreatic ducts were obtained, and three-dimensional MRCP images were rendered by post processing. CONTRAST:  7.61m GADAVIST GADOBUTROL 1 MMOL/ML IV SOLN COMPARISON:  CT abdomen pelvis, 06/27/2021, 07/02/2018 FINDINGS: Lower chest: No acute findings. Hepatobiliary: Mild hepatic steatosis. No mass or other parenchymal abnormality identified. Pancreas: No mass, inflammatory changes, or other parenchymal abnormality identified. Nonenhancing, exophytic fluid signal cysts of the pancreatic tail measuring 3.3 cm (series 11, image 12). Prominent pancreatic duct measuring up to 0.6 cm in caliber (series 11, image 16). As on prior CT, suspect a calculus in the central pancreatic duct measuring approximately 0.5 cm (series 18, image 4); calcified and therefore better appreciated by prior CT. Spleen:  Within normal limits in size and appearance. Adrenals/Urinary Tract: No masses identified. No evidence of hydronephrosis. Stomach/Bowel: Visualized portions within the abdomen are unremarkable. Vascular/Lymphatic: No pathologically enlarged lymph nodes identified. No abdominal aortic aneurysm demonstrated. Other:  None.  Musculoskeletal: No suspicious bone lesions identified. IMPRESSION: 1. Prominent pancreatic duct measuring up to 0.6 cm in caliber. As on prior CT, suspect a calculus in the central pancreatic duct measuring approximately 0.5 cm. This was calcified and therefore better appreciated by prior CT. 2. No biliary tract calculi identified. No biliary ductal dilatation. 3. No acute inflammatory findings. 4. There is a 3.3 cm nonenhancing, exophytic fluid signal cyst of the pancreatic tail, consistent with a pancreatic pseudocyst given history of pancreatitis. This is stable on multiple prior examinations dating back to at least 2019 and benign. No specific further follow-up or characterization is required. 5. Mild hepatic steatosis. Electronically Signed   By: AEddie CandleM.D.   On: 06/28/2021 09:52      Impression / Plan:   Assessment: Principal Problem:   Acute pancreatitis Active Problems:   Chronic obstructive pulmonary disease ([place]   Alcohol abuse   Bipolar depression ([place]   Hyperlipidemia associated with type 2 diabetes mellitus (HCC)   Pancreatic duct stones   Diabetes mellitus without complication ([place]   Tobacco abuse   Schizoaffective disorder ([place]   Hypertriglyceridemia   Shaun NABORis a 55y.o. y/o male with recurrent pancreatitis with acute on chronic pancreatitis.  The patient continues to drink alcohol although in a lesser amount than previously.  There was a stone suggested in the pancreatic duct and a transfer was recommended but the hepatobiliary specialist recommended conservative treatment with ERCP follow-up for stone extraction at a later date.  The patient is feeling better today and reports that he would like to go home.  His lipase on admission was 78 and today it was 28.  Plan:  The patient has been told that if he does go home he should be on a low-fat diet and avoid any further alcohol use.  He had also been told that if he has any abdominal pain he should  continue on a clear liquid diet.  I have contacted my office to send a referral to Dr. Rush Landmark for further evaluation of his pancreatic duct stone.  The patient has been explained the plan agrees with it.  Thank you for involving me in the care of this patient.      LOS: 1 day   Shaun Lame, MD, [facility] 06/29/2021, 3:23 PM,  Pager [phone] 7am-5pm  Check AMION for 5pm -7am coverage and on weekends   Note: This dictation was prepared with Dragon dictation along with smaller phrase technology. Any transcriptional errors that result from this process are unintentional.

## 2021-06-29 NOTE — Care Management CC44 (Signed)
Condition Code 44 Documentation Completed  Patient Details  Name: GIBSON VERMA MRN: [id number] Date of Birth: [birth year]   Condition Code 44 given:  Yes Patient signature on Condition Code 44 notice:  Yes Documentation of 2 MD's agreement:  Yes Code 44 added to claim:  Yes    Candie Chroman, LCSW 06/29/2021, 3:57 PM

## 2021-06-29 NOTE — Care Management Obs Status (Signed)
[place] NOTIFICATION   Patient Details  Name: Shaun Odonnell MRN: [id number] Date of Birth: 12-04-1965   Medicare Observation Status Notification Given:  Yes    Candie Chroman, LCSW 06/29/2021, 3:57 PM

## 2021-06-29 NOTE — Progress Notes (Signed)
Patient provided with discharge education amd materials, verbalized understanding. IV removed, no issues noted. Patient discharged from unit with all belongings.

## 2021-06-29 NOTE — Progress Notes (Signed)
PROGRESS NOTE    Shaun Odonnell  [id number] DOB: Jan 15, 1966 DOA: 06/28/2021 PCP: Gwyneth Sprout, FNP   Brief Narrative:  55 y.o. male with medical history significant of pancreatitis, hyperlipidemia, diabetes mellitus, COPD, depression, anxiety, PE 2003 not on anticoagulants, schizophrenia, bipolar, depression, anxiety, alcohol abuse, tobacco abuse, who presents with abdominal pain.   Patient states that his abdomen started yesterday morning, which is located in upper and middle abdomen, constant, 9-10 out of 10 in severity, aching, radiating to bilateral flank area.  Patient has nausea, no vomiting or diarrhea.  No fever or chills.  Patient has mild dry cough and mild shortness breath, no chest pain.  Denies symptoms of UTI. Pt was have lipase 72. CT showed a probable 6 mm calculus seen in main pancreatic duct in the pancreatic head resulting in pancreatic ductal dilatation.  He also has a 3.7 cystic abnormality in the pancreatic tail which is slightly enlarged compared to prior. EDP consulted Dr. Allen Norris of GI, but pt left hospital AMA before Dr. Leonides Schanz saw patient yesterday.    Per EDP, Dr. Allen Norris "recommended transfer to tertiary care center.  [facility] was not able to accept patient due to bed availability.  Dr. Rush Landmark, hepatobiliary specialist at [facility] was consulted and told Dr. Charna Archer that this does not need any surgical intervention or an ERCP and would be treated like any other type of pancreatitis with symptomatic management and therefore could be managed here at [facility]  Patient was admitted to [facility] and treated conservatively with IV fluids, pain control, antinausea, bowel rest.  Lipase and triglycerides downtrending.  Patient hemodynamically stable.  Has been calling Dr. Dorothey Baseman office to request evaluation and consultation.  I have reached out to Dr. Allen Norris who who indicated that pancreatic ERCP is not performed at this facility.   Assessment & Plan:   Principal Problem:   Acute  pancreatitis Active Problems:   Chronic obstructive pulmonary disease (HCC)   Alcohol abuse   Bipolar depression ([place])   Hyperlipidemia associated with type 2 diabetes mellitus (HCC)   Pancreatic duct stones   Diabetes mellitus without complication ([place])   Tobacco abuse   Schizoaffective disorder ([place])   Hypertriglyceridemia  Acute pancreatitis associated with pancreatic duct stone Initial recommendation was for transfer to per tertiary care center however [facility] not excepting patients due to bed availability and hepatobiliary specialist at [facility] was consulted by EDP and told [facility] ED physician that pancreatic stone would not require any intervention or ERCP and would be treated like any other type of pancreatitis with conservative and symptomatic management. I have consulted Dr. Allen Norris due to patient requests.  Dr. Allen Norris indicated that pancreatic ERCP is not performed at this facility and he had nothing to offer this patient Plan: Conservative management IV fluids As needed pain control As needed antiemetics Trend lipase Continue fibrate's  Chronic obstructive pulmonary disease  -Bronchodilators   Alcohol abuse and tobacco abuse -CIWA protocol -Nicotine patch -Did counseling about importance of quitting alcohol use and tobacco use   Bipolar depression  and schizoaffective disorder -Continue home Xanax, olanzapine, Zoloft   Hyperlipidemia associated with type 2 diabetes mellitus Patient is taking Zocor at home. -Switched to Lipitor in hospital   Diabetes mellitus without complication  Recent 123456 11.2, poorly controlled.   Patient is not taking Invokana, Trulicity and Lantus -Sliding scale insulin -Decrease Lantus dose from 10 to 7 unit daily -Discontinue Trulicity on discharge   Hypertriglyceridemia: Triglyceride 466 -Started fenofibrate 160 mg daily  DVT prophylaxis: SQ Lovenox Code Status: Full Family Communication: None today Disposition Plan: Status is:  Inpatient  Remains inpatient appropriate because:Inpatient level of care appropriate due to severity of illness  Dispo: The patient is from: Home              Anticipated d/c is to: Home              Patient currently is not medically stable to d/c.   Difficult to place patient No       Level of care: Med-Surg  Consultants:  GI  Procedures:  None  Antimicrobials:  None   Subjective: Seen and examined.  Reports improvement in abdominal pain since admission.  Reports appetite  Objective: Vitals:   06/28/21 1524 06/28/21 1656 06/29/21 0516 06/29/21 0734  BP: 121/81 124/74 134/77 118/68  Pulse: (!) 55 (!) 54 70 69  Resp:  '14 15 18  '$ Temp:  97.6 F (36.4 C) 97.9 F (36.6 C) 98 F (36.7 C)  TempSrc:    Oral  SpO2:  95% 95% 90%  Weight:      Height:        Intake/Output Summary (Last 24 hours) at 06/29/2021 1046 Last data filed at 06/29/2021 [id number] Gross per 24 hour  Intake 2354.22 ml  Output 1300 ml  Net 1054.22 ml   Filed Weights   06/28/21 0346  Weight: 80.7 kg    Examination:  General exam: Appears calm and comfortable  Respiratory system: Clear to auscultation. Respiratory effort normal. Cardiovascular system: S1 & S2 heard, RRR. No JVD, murmurs, rubs, gallops or clicks. No pedal edema. Gastrointestinal system: Soft, nondistended, mild tender to palpation left upper quadrant, right upper quadrant.  Positive bowel sounds Central nervous system: Alert and oriented. No focal neurological deficits. Extremities: Symmetric 5 x 5 power. Skin: No rashes, lesions or ulcers Psychiatry: Judgement and insight appear normal. Mood & affect appropriate.     Data Reviewed: I have personally reviewed following labs and imaging studies  CBC: Recent Labs  Lab 06/27/21 0429 06/28/21 0406  WBC 10.0 8.2  NEUTROABS 6.9  --   HGB 18.2* 17.3*  HCT 52.0 48.6  MCV 97.0 96.8  PLT 167 [id number]   Basic Metabolic Panel: Recent Labs  Lab 06/27/21 0429 06/28/21 0406  06/29/21 0715  NA 133* 130* 136  K 4.1 4.1 4.3  CL 93* 94* 102  CO2 '29 25 26  '$ GLUCOSE 331* 424* 104*  BUN '14 10 7  '$ CREATININE 0.75 0.68 0.60*  CALCIUM 9.4 9.2 8.6*   GFR: Estimated Creatinine Clearance: 104.5 mL/min (A) (by C-G formula based on SCr of 0.6 mg/dL (L)). Liver Function Tests: Recent Labs  Lab 06/27/21 0429 06/28/21 0406 06/29/21 0715  AST '16 17 15  '$ ALT '20 18 16  '$ ALKPHOS 103 95 76  BILITOT 0.8 0.8 1.1  PROT 7.4 7.1 6.2*  ALBUMIN 4.5 4.3 3.6   Recent Labs  Lab 06/27/21 0429 06/28/21 0406 06/29/21 0715  LIPASE 78* 72* 28   No results for input(s): AMMONIA in the last 168 hours. Coagulation Profile: Recent Labs  Lab 06/28/21 1656  INR 0.9   Cardiac Enzymes: No results for input(s): CKTOTAL, CKMB, CKMBINDEX, TROPONINI in the last 168 hours. BNP (last 3 results) No results for input(s): PROBNP in the last 8760 hours. HbA1C: No results for input(s): HGBA1C in the last 72 hours. CBG: Recent Labs  Lab 06/28/21 1146 06/28/21 1657 06/28/21 2024 06/29/21 0520 06/29/21 0734  GLUCAP 114* 159* 86 75 92  Lipid Profile: Recent Labs    06/28/21 0420 06/29/21 0715  TRIG 466* 189*   Thyroid Function Tests: No results for input(s): TSH, T4TOTAL, FREET4, T3FREE, THYROIDAB in the last 72 hours. Anemia Panel: No results for input(s): VITAMINB12, FOLATE, FERRITIN, TIBC, IRON, RETICCTPCT in the last 72 hours. Sepsis Labs: No results for input(s): PROCALCITON, LATICACIDVEN in the last 168 hours.  Recent Results (from the past 240 hour(s))  Resp Panel by RT-PCR (Flu A&B, Covid) Nasopharyngeal Swab     Status: None   Collection Time: 06/27/21 11:05 AM   Specimen: Nasopharyngeal Swab; Nasopharyngeal(NP) swabs in vial transport medium  Result Value Ref Range Status   SARS Coronavirus 2 by RT PCR NEGATIVE NEGATIVE Final    Comment: (NOTE) SARS-CoV-2 target nucleic acids are NOT DETECTED.  The SARS-CoV-2 RNA is generally detectable in upper  respiratory specimens during the acute phase of infection. The lowest concentration of SARS-CoV-2 viral copies this assay can detect is 138 copies/mL. A negative result does not preclude SARS-Cov-2 infection and should not be used as the sole basis for treatment or other patient management decisions. A negative result may occur with  improper specimen collection/handling, submission of specimen other than nasopharyngeal swab, presence of viral mutation(s) within the areas targeted by this assay, and inadequate number of viral copies(<138 copies/mL). A negative result must be combined with clinical observations, patient history, and epidemiological information. The expected result is Negative.  Fact Sheet for Patients:  EntrepreneurPulse.com.au  Fact Sheet for Healthcare Providers:  IncredibleEmployment.be  This test is no t yet approved or cleared by the [name] FDA and  has been authorized for detection and/or diagnosis of SARS-CoV-2 by FDA under an Emergency Use Authorization (EUA). This EUA will remain  in effect (meaning this test can be used) for the duration of the COVID-19 declaration under Section 564(b)(1) of the Act, 21 U.S.C.section 360bbb-3(b)(1), unless the authorization is terminated  or revoked sooner.       Influenza A by PCR NEGATIVE NEGATIVE Final   Influenza B by PCR NEGATIVE NEGATIVE Final    Comment: (NOTE) The Xpert Xpress SARS-CoV-2/FLU/RSV plus assay is intended as an aid in the diagnosis of influenza from Nasopharyngeal swab specimens and should not be used as a sole basis for treatment. Nasal washings and aspirates are unacceptable for Xpert Xpress SARS-CoV-2/FLU/RSV testing.  Fact Sheet for Patients: EntrepreneurPulse.com.au  Fact Sheet for Healthcare Providers: IncredibleEmployment.be  This test is not yet approved or cleared by the [name] FDA and has been  authorized for detection and/or diagnosis of SARS-CoV-2 by FDA under an Emergency Use Authorization (EUA). This EUA will remain in effect (meaning this test can be used) for the duration of the COVID-19 declaration under Section 564(b)(1) of the Act, 21 U.S.C. section 360bbb-3(b)(1), unless the authorization is terminated or revoked.  Performed at [facility], [address] [place], [place] [address]   Resp Panel by RT-PCR (Flu A&B, Covid) Nasopharyngeal Swab     Status: None   Collection Time: 06/28/21  8:22 AM   Specimen: Nasopharyngeal Swab; Nasopharyngeal(NP) swabs in vial transport medium  Result Value Ref Range Status   SARS Coronavirus 2 by RT PCR NEGATIVE NEGATIVE Final    Comment: (NOTE) SARS-CoV-2 target nucleic acids are NOT DETECTED.  The SARS-CoV-2 RNA is generally detectable in upper respiratory specimens during the acute phase of infection. The lowest concentration of SARS-CoV-2 viral copies this assay can detect is 138 copies/mL. A negative result does not preclude SARS-Cov-2 infection  and should not be used as the sole basis for treatment or other patient management decisions. A negative result may occur with  improper specimen collection/handling, submission of specimen other than nasopharyngeal swab, presence of viral mutation(s) within the areas targeted by this assay, and inadequate number of viral copies(<138 copies/mL). A negative result must be combined with clinical observations, patient history, and epidemiological information. The expected result is Negative.  Fact Sheet for Patients:  EntrepreneurPulse.com.au  Fact Sheet for Healthcare Providers:  IncredibleEmployment.be  This test is no t yet approved or cleared by the [name] FDA and  has been authorized for detection and/or diagnosis of SARS-CoV-2 by FDA under an Emergency Use Authorization (EUA). This EUA will remain  in effect (meaning this  test can be used) for the duration of the COVID-19 declaration under Section 564(b)(1) of the Act, 21 U.S.C.section 360bbb-3(b)(1), unless the authorization is terminated  or revoked sooner.       Influenza A by PCR NEGATIVE NEGATIVE Final   Influenza B by PCR NEGATIVE NEGATIVE Final    Comment: (NOTE) The Xpert Xpress SARS-CoV-2/FLU/RSV plus assay is intended as an aid in the diagnosis of influenza from Nasopharyngeal swab specimens and should not be used as a sole basis for treatment. Nasal washings and aspirates are unacceptable for Xpert Xpress SARS-CoV-2/FLU/RSV testing.  Fact Sheet for Patients: EntrepreneurPulse.com.au  Fact Sheet for Healthcare Providers: IncredibleEmployment.be  This test is not yet approved or cleared by the [name] FDA and has been authorized for detection and/or diagnosis of SARS-CoV-2 by FDA under an Emergency Use Authorization (EUA). This EUA will remain in effect (meaning this test can be used) for the duration of the COVID-19 declaration under Section 564(b)(1) of the Act, 21 U.S.C. section 360bbb-3(b)(1), unless the authorization is terminated or revoked.  Performed at [facility], [address], [place], [place] [address]          Radiology Studies: MR ABDOMEN MRCP W WO CONTAST  Result Date: 06/28/2021 CLINICAL DATA:  Pancreatitis suspected, abnormal CT, pancreatic duct calculus suspected EXAM: MRI ABDOMEN WITHOUT AND WITH CONTRAST (INCLUDING MRCP) TECHNIQUE: Multiplanar multisequence MR imaging of the abdomen was performed both before and after the administration of intravenous contrast. Heavily T2-weighted images of the biliary and pancreatic ducts were obtained, and three-dimensional MRCP images were rendered by post processing. CONTRAST:  7.47m GADAVIST GADOBUTROL 1 MMOL/ML IV SOLN COMPARISON:  CT abdomen pelvis, 06/27/2021, 07/02/2018 FINDINGS: Lower chest: No acute findings.  Hepatobiliary: Mild hepatic steatosis. No mass or other parenchymal abnormality identified. Pancreas: No mass, inflammatory changes, or other parenchymal abnormality identified. Nonenhancing, exophytic fluid signal cysts of the pancreatic tail measuring 3.3 cm (series 11, image 12). Prominent pancreatic duct measuring up to 0.6 cm in caliber (series 11, image 16). As on prior CT, suspect a calculus in the central pancreatic duct measuring approximately 0.5 cm (series 18, image 4); calcified and therefore better appreciated by prior CT. Spleen:  Within normal limits in size and appearance. Adrenals/Urinary Tract: No masses identified. No evidence of hydronephrosis. Stomach/Bowel: Visualized portions within the abdomen are unremarkable. Vascular/Lymphatic: No pathologically enlarged lymph nodes identified. No abdominal aortic aneurysm demonstrated. Other:  None. Musculoskeletal: No suspicious bone lesions identified. IMPRESSION: 1. Prominent pancreatic duct measuring up to 0.6 cm in caliber. As on prior CT, suspect a calculus in the central pancreatic duct measuring approximately 0.5 cm. This was calcified and therefore better appreciated by prior CT. 2. No biliary tract calculi identified. No biliary ductal dilatation. 3. No  acute inflammatory findings. 4. There is a 3.3 cm nonenhancing, exophytic fluid signal cyst of the pancreatic tail, consistent with a pancreatic pseudocyst given history of pancreatitis. This is stable on multiple prior examinations dating back to at least 2019 and benign. No specific further follow-up or characterization is required. 5. Mild hepatic steatosis. Electronically Signed   By: Eddie Candle M.D.   On: 06/28/2021 09:52        Scheduled Meds:  ALPRAZolam  0.5 mg Oral QID   atorvastatin  40 mg Oral Daily   fenofibrate  160 mg Oral Daily   folic acid  1 mg Oral Daily   heparin  5,000 Units Subcutaneous Q8H   insulin aspart  0-5 Units Subcutaneous QHS   insulin aspart  0-9  Units Subcutaneous TID WC   insulin glargine-yfgn  7 Units Subcutaneous Q2200   LORazepam  0-4 mg Oral Q6H   Followed by   Derrill Memo ON 06/30/2021] LORazepam  0-4 mg Oral Q12H   mometasone-formoterol  2 puff Inhalation BID   multivitamin with minerals  1 tablet Oral Daily   nicotine  21 mg Transdermal Daily   OLANZapine  20 mg Oral QHS   sertraline  100 mg Oral Daily   thiamine  100 mg Oral Daily   Or   thiamine  100 mg Intravenous Daily   Continuous Infusions:  sodium chloride 125 mL/hr at 06/29/21 0835     LOS: 1 day    Time spent: 25 minutes    Sidney Ace, MD [facility] Pager [phone]  If 7PM-7AM, please contact night-coverage 06/29/2021, 10:46 AM

## 2021-06-30 ENCOUNTER — Encounter: Payer: Self-pay | Admitting: Gastroenterology

## 2021-06-30 ENCOUNTER — Telehealth: Payer: Self-pay | Admitting: Gastroenterology

## 2021-06-30 ENCOUNTER — Other Ambulatory Visit: Payer: Self-pay | Admitting: *Deleted

## 2021-06-30 ENCOUNTER — Telehealth (INDEPENDENT_AMBULATORY_CARE_PROVIDER_SITE_OTHER): Payer: Medicare Other | Admitting: Family Medicine

## 2021-06-30 DIAGNOSIS — I471 Supraventricular tachycardia: Secondary | ICD-10-CM | POA: Diagnosis not present

## 2021-06-30 NOTE — Telephone Encounter (Signed)
Hi Dr. Rush Landmark,  We received a referral from Dr. Allen Norris over at [facility] for patient to have ERCP for pancreatic stone. Patient is calling to follow up on referral should I schedule with you for an evaluation or would you need anything prior to scheduling?   Thanks

## 2021-06-30 NOTE — Telephone Encounter (Signed)
Patient advised as below. He does not want to start any medication at this time. Patient is asking if you are willing to take over his pain medication for kidney stones. Patient does have a follow up with specialist in 07/26/21. Please advise.

## 2021-06-30 NOTE — Telephone Encounter (Signed)
Results of Zio patch reviewed from 06/05/2021.  Patient wore device for 14 days from 8/1-8/15.  Findings are below.  Please relay the last section with my recommendations to the patient.  Patient had a minimum heart rate of 47 bpm, max heart rate of 197 bpm, and average heart rate of 77 bpm.  Predominant underlying rhythm was sinus rhythm.  2 runs of supraventricular tachycardia occurred.  The run with the fastest interval lasting 6 beats with a max rate of 197 bpm, average 184 bpm.  The run with the fastest interval was also the longest.  Isolated SVE's were rare less than 1%, SVE couplets were rare less than 1%, and SVE triplets were rare less than 1%.  Isolated VE's were rare less than 1%, and no VE couplets or VE triplets were present.  Overall, this represents paroxysmal SVT.  This is likely the cause of his palpitations.  This causes intermittent episodes of fast heart rate.  If patient is often symptomatic, we could start a daily beta-blocker or alternatively a beta-blocker as needed for days where he is more symptomatic.  Also let patient know that smoking, caffeine intake, lack of sleep can also contribute to this.

## 2021-06-30 NOTE — Telephone Encounter (Signed)
Pt stated he has a blockage in his pancreas and when released from the hospital he was only given a Rx for 5 pills for the pain. Pt stated he only has 1 pill remaining from that Rx and he was told to contact pcp to refill it. Pt aware office is closed until Monday and requests call back from a nurse to suggest what he should do until pcp approves refill. Cb# [phone]  Called patient to review symptoms of pain. No answer, left voicemail to call clinic back .

## 2021-06-30 NOTE — Telephone Encounter (Signed)
Hi Patty,  Can you help with a sooner slot that is on hold for this patient please and thank you.

## 2021-06-30 NOTE — Telephone Encounter (Signed)
9/21 at 230 pm has been made with Dr  Rush Landmark

## 2021-06-30 NOTE — Telephone Encounter (Signed)
This patient needs to be seen in clinic to discuss potential role of ERCP. He can be scheduled with next available and OK to use one of my placeholder slots but not overbooking. Thanks. GM

## 2021-06-30 NOTE — Telephone Encounter (Signed)
Requested medication (s) are due for refill today: see encounter  Requested medication (s) are on the active medication list: yes  Last refill:  06/29/21-07/01/21 #5 0 refills  Future visit scheduled: yes future visit in 2 months   Notes to clinic:  not delegated per protocol, patient requesting Rx until 07/26/21 until he sees GI specialist. Was told to notify PCP for pain medication refill. Patient has 1 tablet left.      Requested Prescriptions  Pending Prescriptions Disp Refills   oxyCODONE (ROXICODONE) 5 MG immediate release tablet 5 tablet 0    Sig: Take 1 tablet (5 mg total) by mouth every 8 (eight) hours as needed for up to 2 days.     Not Delegated - Analgesics:  Opioid Agonists Failed - 06/30/2021  6:53 PM      Failed - This refill cannot be delegated      Failed - Urine Drug Screen completed in last 360 days      Passed - Valid encounter within last 6 months    Recent Outpatient Visits           3 weeks ago Encounter for annual physical exam   [facility] [place], Dionne Bucy, MD   6 months ago Type 2 diabetes mellitus with hyperglycemia, without long-term current use of insulin [facility])   [place], [place], PA-C   8 months ago Chronic obstructive pulmonary disease, unspecified COPD type [facility])   [place], Vickki Muff, PA-C   9 months ago Type 2 diabetes mellitus with hyperglycemia, without long-term current use of insulin [facility])   [facility], Fabio Bering M, [place]   12 months ago Cough   [facility] Birdie Sons, MD       Future Appointments             In 3 weeks Chesley Mires, MD [place]   In 2 months [place], Dionne Bucy, MD [facility], [place]

## 2021-06-30 NOTE — Telephone Encounter (Signed)
Patient returned call and reports GI specialists has instructed patient to contact PCP for refills of pain medication. Will submit refill request now . Patient has appt scheduled with GI specialist 07/26/21. Instructed patient if pain is severe he would need to go to ED for evaluation.

## 2021-07-02 ENCOUNTER — Other Ambulatory Visit: Payer: Self-pay

## 2021-07-02 ENCOUNTER — Encounter: Payer: Self-pay | Admitting: Emergency Medicine

## 2021-07-02 ENCOUNTER — Emergency Department
Admission: EM | Admit: 2021-07-02 | Discharge: 2021-07-02 | Disposition: A | Discharge status: Home / Self Care | Payer: Medicare Other | Attending: Emergency Medicine | Admitting: Emergency Medicine

## 2021-07-02 DIAGNOSIS — Z794 Long term (current) use of insulin: Secondary | ICD-10-CM | POA: Diagnosis not present

## 2021-07-02 DIAGNOSIS — I129 Hypertensive chronic kidney disease with stage 1 through stage 4 chronic kidney disease, or unspecified chronic kidney disease: Secondary | ICD-10-CM | POA: Diagnosis not present

## 2021-07-02 DIAGNOSIS — J449 Chronic obstructive pulmonary disease, unspecified: Secondary | ICD-10-CM | POA: Insufficient documentation

## 2021-07-02 DIAGNOSIS — E1169 Type 2 diabetes mellitus with other specified complication: Secondary | ICD-10-CM | POA: Diagnosis not present

## 2021-07-02 DIAGNOSIS — F17210 Nicotine dependence, cigarettes, uncomplicated: Secondary | ICD-10-CM | POA: Diagnosis not present

## 2021-07-02 DIAGNOSIS — E1122 Type 2 diabetes mellitus with diabetic chronic kidney disease: Secondary | ICD-10-CM | POA: Diagnosis not present

## 2021-07-02 DIAGNOSIS — E785 Hyperlipidemia, unspecified: Secondary | ICD-10-CM | POA: Diagnosis not present

## 2021-07-02 DIAGNOSIS — Z760 Encounter for issue of repeat prescription: Secondary | ICD-10-CM | POA: Insufficient documentation

## 2021-07-02 DIAGNOSIS — R110 Nausea: Secondary | ICD-10-CM | POA: Insufficient documentation

## 2021-07-02 DIAGNOSIS — Z7951 Long term (current) use of inhaled steroids: Secondary | ICD-10-CM | POA: Diagnosis not present

## 2021-07-02 DIAGNOSIS — N189 Chronic kidney disease, unspecified: Secondary | ICD-10-CM | POA: Insufficient documentation

## 2021-07-02 MED ORDER — OXYCODONE HCL 5 MG PO TABS: 5.0000 mg | ORAL_TABLET | Freq: Three times a day (TID) | ORAL | 0 refills | Status: DC | PRN

## 2021-07-02 MED ORDER — ONDANSETRON 8 MG PO TBDP: 8.0000 mg | ORAL_TABLET | Freq: Three times a day (TID) | ORAL | 0 refills | Status: DC | PRN

## 2021-07-02 NOTE — ED Triage Notes (Signed)
Pt reports was seen here Thursday and told he had a stone in his pancreatic duct. Pt reports hew as prescribed pain medications (Oxycodone) and advised to follow up with his PMD for continued pain control and treatment. Pt states he ran out of meds and his PMD will not fill it so he needs a mediation refil.

## 2021-07-02 NOTE — Discharge Instructions (Addendum)
Consider taking OTC ibuprofen 600-800 mg with food and/or Tylenol [phone] up to 3 times for additional pain relief. Follow-up with your GI provider for continued pain management. Follow-up with the GI Specialist as soon as possible.

## 2021-07-02 NOTE — ED Provider Notes (Signed)
[facility] Emergency Department Provider Note ____________________________________________  Time seen: 1027  I have reviewed the triage vital signs and the nursing notes.  HISTORY  Chief Complaint  Medication Refill   HPI Shaun Odonnell is a 55 y.o. male presents to the ED with request for pain medicine refill.  Patient was released from the ED about 5 days ago, following an ED observation and pain management of his confirmed pancreatic duct stone.  Several attempts were made during his ED course to find an appropriate GI specialist to accept his case for surgical intervention.  It was probably discussed the patient's condition should be treated symptomatically as one would for an acute pancreatitis including pain medicine and fluid hydration.  Patient was ultimately discharged in stable condition, to follow-up with a GI specialist in [facility] with the specialist trying to manage his pancreatic duct stone.  Patient presents today, noting that he ran out of his pain medicine yesterday, when he attempted to call his PCP was advised to report to the ED for pain medicine.  He presents in no acute distress, denies any interim fever, chills, sweats, chest pain, or vomiting.  He reports his nausea is well controlled with his prescription nausea medicines.  Past Medical History:  Diagnosis Date   Alcohol abuse 04/29/2019   Allergy    Anxiety    ARDS (adult respiratory distress syndrome) (HCC)    ARDS (adult respiratory distress syndrome) (HCC)    Bipolar disorder (HCC)    Borderline diabetes    Cataract    Chronic kidney disease    COPD (chronic obstructive pulmonary disease) (HCC)    chronic cough and wheezing   Depression    Diabetes mellitus without complication (HCC)    Dizziness    medication related   Dyspnea    with exertion   Elevated lipids    Fatty liver    Hypercholesterolemia    Hypertension    Pancreatitis    Pneumonia    in past   Pneumonia     in past   Pre-diabetes    diet controlled   Pulmonary embolism ([place])    Schizoaffective disorder ([place])     Patient Active Problem List   Diagnosis Date Noted   Paroxysmal SVT (supraventricular tachycardia) ([place]) 06/30/2021   Pancreatic duct stones 06/28/2021   Diabetes mellitus without complication ([place]) [id number]   Tobacco abuse 06/28/2021   Hypertriglyceridemia 06/28/2021   Schizoaffective disorder ([place])    Palpitations 06/05/2021   Dizziness 06/05/2021   Drug-seeking behavior 09/17/2019   Hyperlipidemia associated with type 2 diabetes mellitus ([place]) 09/17/2019   Xyphoidalgia    Type 2 diabetes mellitus with hyperglycemia, without long-term current use of insulin ([place]) 04/29/2019   Chronic obstructive pulmonary disease ([place]) 04/29/2019   Alcohol abuse 04/29/2019   Bipolar depression ([place]) [id number]   Umbilical hernia without obstruction and without gangrene 04/23/2019   Acute pancreatitis 12/31/2018   Pancreatitis 07/02/2018    Past Surgical History:  Procedure Laterality Date   CATARACT EXTRACTION W/PHACO Right 03/26/2018   Procedure: CATARACT EXTRACTION PHACO AND INTRAOCULAR LENS PLACEMENT ([place]) RIGHT BORDERLINE DIABETIC;  Surgeon: Leandrew Koyanagi, MD;  Location: [place];  Service: Ophthalmology;  Laterality: Right;   CATARACT EXTRACTION W/PHACO Left 04/23/2018   Procedure: CATARACT EXTRACTION PHACO AND INTRAOCULAR LENS PLACEMENT ([place])  LEFT BORDERLINE DIABETIC;  Surgeon: Leandrew Koyanagi, MD;  Location: [place];  Service: Ophthalmology;  Laterality: Left;   COLONOSCOPY WITH PROPOFOL N/A 08/21/2017   Procedure: COLONOSCOPY  WITH PROPOFOL;  Surgeon: Manya Silvas, MD;  Location: [facility] ENDOSCOPY;  Service: Endoscopy;  Laterality: N/A;   EYE SURGERY     HERNIA REPAIR     inguinal and umbilical   RIB RESECTION N/A 08/20/2019   Procedure: removal of xyphoid process;  Surgeon: Nestor Lewandowsky, MD;  Location: [facility] ORS;  Service: Thoracic;   Laterality: N/A;   UMBILICAL HERNIA REPAIR N/A 06/22/2019   Procedure: OPEN HERNIA REPAIR UMBILICAL ADULT;  Surgeon: Olean Ree, MD;  Location: [facility] ORS;  Service: General;  Laterality: N/A;   UPPER GI ENDOSCOPY      Prior to Admission medications   Medication Sig Start Date End Date Taking? Authorizing Provider  oxyCODONE (ROXICODONE) 5 MG immediate release tablet Take 1 tablet (5 mg total) by mouth every 8 (eight) hours as needed for up to 7 days. 07/02/21 07/09/21 Yes Zykerria Tanton, Dannielle Karvonen, PA-C  albuterol (PROVENTIL) (2.5 MG/3ML) 0.083% nebulizer solution Take 3 mLs (2.5 mg total) by nebulization every 6 (six) hours as needed for wheezing or shortness of breath. 05/18/21   Chesley Mires, MD  albuterol (VENTOLIN HFA) 108 (90 Base) MCG/ACT inhaler INHALE 2 PUFFS BY MOUTH EVERY 6 HOURS ASNEEDED WHEEZING/ SHORTNESS OF BREATH 03/02/21   Birdie Sons, MD  ALPRAZolam Duanne Moron) 0.5 MG tablet Take 0.5 mg by mouth 4 (four) times daily. Patient takes when wakes up(~0600)/ 1000/ 1600/ 2000 06/26/18   [provider]  budesonide-formoterol (SYMBICORT) 80-4.5 MCG/ACT inhaler Inhale 2 puffs into the lungs in the morning and at bedtime. 05/18/21   Chesley Mires, MD  carboxymethylcellulose (REFRESH PLUS) 0.5 % SOLN Place 2 drops into both eyes as needed (Dry eyes).    [provider]  carboxymethylcellulose (REFRESH PLUS) 0.5 % SOLN Apply to eye.    [provider]  glucose blood test strip Use as instructed 12/17/19   Trinna Post, PA-C  insulin glargine (LANTUS SOLOSTAR) 100 UNIT/ML Solostar Pen INJECT 10 UNITS SUBCUTANEOUSLY AT BEDTIME 05/29/21   Virginia Crews, MD  Insulin Pen Needle (UNIFINE PENTIPS) 32G X 4 MM MISC USE WITH INSULIN ONCE DAILY 02/08/21   Mar Daring, PA-C  INVOKANA 300 MG TABS tablet TAKE 1 TABLET BY MOUTH ONCE DAILY BEFOREBREAFKAST 04/25/21   Chrismon, Vickki Muff, PA-C  meclizine (ANTIVERT) 25 MG tablet TAKE 2 TABLETS BY MOUTH 3 TIMES DAILY ASNEEDED  06/26/21   Gwyneth Sprout, FNP  OLANZapine (ZYPREXA) 20 MG tablet  02/21/21   [provider]  OLANZapine (ZYPREXA) 20 MG tablet Take 20 mg by mouth at bedtime. 04/04/21   [provider]  ondansetron (ZOFRAN) 4 MG tablet Take 1 tablet (4 mg total) by mouth daily as needed for nausea or vomiting. 06/29/21 06/29/22  Sidney Ace, MD  ondansetron (ZOFRAN-ODT) 8 MG disintegrating tablet Take 1 tablet (8 mg total) by mouth every 8 (eight) hours as needed for nausea or vomiting. 07/02/21   Payten Hobin, Dannielle Karvonen, PA-C  OneTouch Delica Lancets [id number] MISC USE TO CHECK FASTING SUGAR TWICE DAILY 12/07/20   Trinna Post, PA-C  sertraline (ZOLOFT) 100 MG tablet Take 100 mg by mouth daily.     [provider]  sertraline (ZOLOFT) 100 MG tablet Take by mouth.    [provider]  simvastatin (ZOCOR) 80 MG tablet TAKE 1 TABLET BY MOUTH AT BEDTIME 05/17/21   Virginia Crews, MD    Allergies Fentanyl, Morphine and related, Clonazepam, Pimozide, Trifluoperazine, Azithromycin, Montelukast, Morphine, and Tramadol  Family History  Problem Relation Age of Onset   Hyperlipidemia Mother    Hypertension Father    Dementia Maternal Grandmother    Heart disease Maternal Grandfather    Heart disease Paternal Grandmother    Stroke Paternal Grandfather    Diabetes Paternal Grandfather    Diabetes Maternal Aunt     Social History Social History   Tobacco Use   Smoking status: Every Day    Packs/day: 1.50    Years: 34.00    Pack years: 51.00    Types: Cigarettes   Smokeless tobacco: Former    Types: Snuff  Vaping Use   Vaping Use: Former  Substance Use Topics   Alcohol use: Yes    Alcohol/week: 6.0 standard drinks    Types: 6 Cans of beer per week    Comment: 3 beers maybe 2x a week   Drug use: Not Currently    Review of Systems  Constitutional: Negative for fever. Eyes: Negative for visual changes. ENT: Negative for sore throat. Cardiovascular: Negative  for chest pain. Respiratory: Negative for shortness of breath. Gastrointestinal: Negative for abdominal pain, vomiting and diarrhea. Genitourinary: Negative for dysuria. Musculoskeletal: Negative for back pain. Skin: Negative for rash. Neurological: Negative for headaches, focal weakness or numbness. ____________________________________________  PHYSICAL EXAM:  VITAL SIGNS: ED Triage Vitals  Enc Vitals Group     BP 07/02/21 0922 123/76     Pulse Rate 07/02/21 0922 80     Resp 07/02/21 0922 20     Temp 07/02/21 0922 98.2 F (36.8 C)     Temp Source 07/02/21 0922 Oral     SpO2 07/02/21 0922 96 %     Weight 07/02/21 0907 178 lb 9.2 oz (81 kg)     Height 07/02/21 0907 '5\' 8"'$  (1.727 m)     Head Circumference --      Peak Flow --      Pain Score 07/02/21 0907 6     Pain Loc --      Pain Edu? --      Excl. in [place]? --     Constitutional: Alert and oriented. Well appearing and in no distress. Head: Normocephalic and atraumatic. Eyes: Conjunctivae are normal. Normal extraocular movements Cardiovascular: Normal rate, regular rhythm. Normal distal pulses. Respiratory: Normal respiratory effort. No wheezes/rales/rhonchi. Gastrointestinal: Soft and nontender. No distention.  Bowel sounds noted. Musculoskeletal: Nontender with normal range of motion in all extremities.  Neurologic:  Normal gait without ataxia. Normal speech and language. No gross focal neurologic deficits are appreciated. Skin:  Skin is warm, dry and intact. No rash noted. Psychiatric: Mood and affect are normal. Patient exhibits appropriate insight and judgment. ____________________________________________    {LABS (pertinent positives/negatives)  ____________________________________________  {EKG  ____________________________________________   RADIOLOGY Official radiology report(s): No results  found. ____________________________________________  PROCEDURES   Procedures ____________________________________________   INITIAL IMPRESSION / ASSESSMENT AND PLAN / ED COURSE  As part of my medical decision making, I reviewed the following data within the electronic MEDICAL RECORD NUMBER Notes from prior ED visits and [place] Controlled Substance Database    Patient to the ED with request for pain medicine refill after recent ED observation discharge.  Patient with a confirmed pancreatic duct stone, with ongoing pain control.  He was referred to a GI specialist in [place] with the expertise to remove the pancreatic duct stone.  Patient was placed on his calendar for an appointment in 3 weeks.  He will follow-up with the office regularly to assess for any cancellations.  A courtesy refill of his pain medicine and nausea medicine have been provided at this ED visit.  Patient is encouraged to follow-up with his primary GI provider for ongoing pain management.  Return precautions have been reviewed.    RIVALDO BECKNELL was evaluated in Emergency Department on 07/02/2021 for the symptoms described in the history of present illness. He was evaluated in the context of the global COVID-19 pandemic, which necessitated consideration that the patient might be at risk for infection with the SARS-CoV-2 virus that causes COVID-19. Institutional protocols and algorithms that pertain to the evaluation of patients at risk for COVID-19 are in a state of rapid change based on information released by regulatory bodies including the CDC and federal and state organizations. These policies and algorithms were followed during the patient's care in the ED.  I reviewed the patient's prescription history over the last 12 months in the multi-state controlled substances database(s) that includes [place], [place], [place], [place], [place], [place], [place], [place], [place], [place], [place],  [place], [place], and [place].  Results were notable for recent & regular RXs noted.  ____________________________________________  FINAL CLINICAL IMPRESSION(S) / ED DIAGNOSES  Final diagnoses:  Encounter for medication refill  Nausea      Lonita Debes, Dannielle Karvonen, PA-C 07/02/21 1057    Nance Pear, MD 07/02/21 1144

## 2021-07-03 NOTE — Telephone Encounter (Signed)
Two additional prescriptions for the same opioid were written and picked up within the past week

## 2021-07-03 NOTE — Telephone Encounter (Signed)
That would be up to his PCP, not me.

## 2021-07-06 ENCOUNTER — Encounter: Payer: Self-pay | Admitting: Gastroenterology

## 2021-07-06 ENCOUNTER — Ambulatory Visit (INDEPENDENT_AMBULATORY_CARE_PROVIDER_SITE_OTHER): Payer: Medicare Other | Admitting: Gastroenterology

## 2021-07-06 ENCOUNTER — Telehealth: Payer: Self-pay | Admitting: Gastroenterology

## 2021-07-06 VITALS — BP 110/68 | HR 65 | Ht 68.0 in | Wt 175.0 lb

## 2021-07-06 DIAGNOSIS — K860 Alcohol-induced chronic pancreatitis: Secondary | ICD-10-CM | POA: Diagnosis not present

## 2021-07-06 DIAGNOSIS — K862 Cyst of pancreas: Secondary | ICD-10-CM | POA: Insufficient documentation

## 2021-07-06 DIAGNOSIS — F17219 Nicotine dependence, cigarettes, with unspecified nicotine-induced disorders: Secondary | ICD-10-CM | POA: Insufficient documentation

## 2021-07-06 DIAGNOSIS — K859 Acute pancreatitis without necrosis or infection, unspecified: Secondary | ICD-10-CM | POA: Diagnosis not present

## 2021-07-06 DIAGNOSIS — F1020 Alcohol dependence, uncomplicated: Secondary | ICD-10-CM

## 2021-07-06 DIAGNOSIS — F17200 Nicotine dependence, unspecified, uncomplicated: Secondary | ICD-10-CM

## 2021-07-06 DIAGNOSIS — K8689 Other specified diseases of pancreas: Secondary | ICD-10-CM

## 2021-07-06 DIAGNOSIS — R1084 Generalized abdominal pain: Secondary | ICD-10-CM | POA: Diagnosis not present

## 2021-07-06 DIAGNOSIS — R935 Abnormal findings on diagnostic imaging of other abdominal regions, including retroperitoneum: Secondary | ICD-10-CM

## 2021-07-06 MED ORDER — OXYCODONE HCL 5 MG PO TABS: 5.0000 mg | ORAL_TABLET | Freq: Three times a day (TID) | ORAL | 0 refills | Status: AC | PRN

## 2021-07-06 NOTE — Progress Notes (Signed)
[place] VISIT   Primary Care Provider Gwyneth Sprout, [place] [address] [phone]  Referring Provider Dr. Allen Norris  Patient Profile: Shaun Odonnell is a 55 y.o. male with a pmh significant for diabetes, hyperlipidemia, COPD, bipolar disorder, MDD/anxiety, prior PE, colon polyps (TAs), tobacco use disorder, alcohol use disorder, recurrent acute pancreatitis secondary to alcohol, ?PD stone and likely chronic pancreatitis, pancreatic tail cystic lesion (chronic).  The patient presents to the [facility] for an evaluation and management of problem(s) noted below:  Problem List 1. Alcohol-induced chronic pancreatitis ([place])   2. Pancreatic stones   3. Abnormal CT of the pancreas   4. Pancreatic cyst   5. Dilation of pancreatic duct   6. Generalized abdominal pain   7. Recurrent acute pancreatitis   8. Tobacco use disorder   9. Alcohol use disorder, moderate, dependence ([place])   10. Abnormal MRI of pancreas     History of Present Illness This is the patient's first visit to the outpatient [place] clinic.  Dr. Allen Norris is his primary gastroenterologist at [facility].  This is a patient who has had longstanding issues of alcohol use disorder and has continued to drink alcohol.  He has had multiple bouts of pancreatitis in his history and has required hospitalization on at least 4 occasions.  His most recent hospitalization he was found to have abnormal imaging concerning for a pancreatic duct stone and potential pancreatic duct dilation.  He was still drinking alcohol.  For 20+ years he was drinking at least a sixpack per day and for the last 5 to 10 years was drinking a sixpack every 2 to 3 days up until his most recent hospitalization.  The patient also has a significant history for tobacco use and smokes up to 1-1/2 packs/day.  His primary gastroenterologist has told him in the past that he should stop drinking alcohol to  minimize risk of recurrent pancreatitis.  His prior episode of pancreatitis in the hospital was back in 2022 so he went 2 years between episodes of pancreatitis.  The patient is accompanied by his parents today.  He is on pain medications for his "pancreatic duct stone pain".  Patient has a bowel movement on a daily basis that is formed.  He has no blood in his stools.  He reports not having chronic abdominal pain on a daily basis until this most recent hospitalization.  Patient is also on a diabetic medication of Invokana for which she has been on for over a year but he still takes this at times.  This has been associated with pancreatitis in the past as well.  He does not take significant nonsteroidals or BC/Goody powders.  He has never had an upper endoscopy per report.  Currently the patient is experiencing episodes of pain that are occurring every few hours for which she is taking oxycodone that helps.  The pain is in the midepigastrium and bandlike around to the back.  He is having some nausea at times but no vomiting.  His weight is stable.  GI Review of Systems Positive as above Negative for dysphagia, odynophagia, melena, hematochezia  Review of Systems General: Denies fevers/chills HEENT: Denies oral lesions Cardiovascular: Denies chest pain Pulmonary: Denies shortness of breath Gastroenterological: See HPI Genitourinary: Denies darkened urine  Hematological: Denies easy bruising/bleeding Dermatological: Denies jaundice Psychological: Mood is stable   Medications Current Outpatient Medications  Medication Sig Dispense Refill   albuterol (PROVENTIL) (2.5 MG/3ML) 0.083% nebulizer solution  Take 3 mLs (2.5 mg total) by nebulization every 6 (six) hours as needed for wheezing or shortness of breath. 360 mL 5   albuterol (VENTOLIN HFA) 108 (90 Base) MCG/ACT inhaler INHALE 2 PUFFS BY MOUTH EVERY 6 HOURS ASNEEDED WHEEZING/ SHORTNESS OF BREATH 8.5 g 1   ALPRAZolam (XANAX) 0.5 MG tablet Take  0.5 mg by mouth 4 (four) times daily. Patient takes when wakes up(~0600)/ 1000/ 1600/ 2000     budesonide-formoterol (SYMBICORT) 80-4.5 MCG/ACT inhaler Inhale 2 puffs into the lungs in the morning and at bedtime. 1 each 12   carboxymethylcellulose (REFRESH PLUS) 0.5 % SOLN Place 2 drops into both eyes as needed (Dry eyes).     Dulaglutide (TRULICITY) 3 0000000 SOPN Inject 3 mg into the skin once a week.     glucose blood test strip Use as instructed 200 each 12   insulin glargine (LANTUS SOLOSTAR) 100 UNIT/ML Solostar Pen INJECT 10 UNITS SUBCUTANEOUSLY AT BEDTIME 9 mL 0   Insulin Pen Needle (UNIFINE PENTIPS) 32G X 4 MM MISC USE WITH INSULIN ONCE DAILY 90 each 1   INVOKANA 300 MG TABS tablet TAKE 1 TABLET BY MOUTH ONCE DAILY BEFOREBREAFKAST 90 tablet 0   meclizine (ANTIVERT) 25 MG tablet TAKE 2 TABLETS BY MOUTH 3 TIMES DAILY ASNEEDED 60 tablet 0   OLANZapine (ZYPREXA) 20 MG tablet Take 20 mg by mouth at bedtime.     ondansetron (ZOFRAN-ODT) 8 MG disintegrating tablet Take 1 tablet (8 mg total) by mouth every 8 (eight) hours as needed for nausea or vomiting. 30 tablet 0   OneTouch Delica Lancets 99991111 MISC USE TO CHECK FASTING SUGAR TWICE DAILY 100 each 1   sertraline (ZOLOFT) 100 MG tablet Take 100 mg by mouth daily.      simvastatin (ZOCOR) 80 MG tablet TAKE 1 TABLET BY MOUTH AT BEDTIME 90 tablet 1   oxyCODONE (ROXICODONE) 5 MG immediate release tablet Take 1 tablet (5 mg total) by mouth every 8 (eight) hours as needed for up to 5 days. 15 tablet 0   No current facility-administered medications for this visit.    Allergies Allergies  Allergen Reactions   Fentanyl Nausea And Vomiting    Other reaction(s): Unknown   Morphine And Related Shortness Of Breath   Clonazepam Other (See Comments)    Dystonic Reaction   Other reaction(s): Other (See Comments) "Dystonic"; alprazolam is a home med "Dystonic"; alprazolam is a home med Dystonic Reaction   Pimozide Other (See Comments)    Dystonic  Reaction  Other reaction(s): Other (See Comments) "Distonic" "Distonic" Dystonic Reaction   Trifluoperazine Other (See Comments)    Dystonic Reaction Other reaction(s): Other (See Comments) "Distonic" "Distonic" Dystonic reaction Dystonic Reaction   Azithromycin Other (See Comments)    Was told not to take Z pak due to his heart   Montelukast     headache, nausea, feeling a little anxious, panic   Morphine Other (See Comments)    Other reaction(s): Other (See Comments) " I stop breathing"; can take Diluadid " I stop breathing"; can take Diluadid Cnnot tolerate d/t Drug Metabolism   Tramadol Nausea Only    Histories Past Medical History:  Diagnosis Date   Alcohol abuse 04/29/2019   Allergy    Anxiety    ARDS (adult respiratory distress syndrome) (HCC)    Bipolar disorder (HCC)    Borderline diabetes    Cataract    Chronic kidney disease    COPD (chronic obstructive pulmonary disease) (HCC)    chronic  cough and wheezing   Depression    Diabetes mellitus without complication (HCC)    Dizziness    medication related   Dyspnea    with exertion   Elevated lipids    Fatty liver    Hypercholesterolemia    Hypertension    Pancreatitis    Pneumonia    in past   Pre-diabetes    diet controlled   Pulmonary embolism (HCC)    Schizoaffective disorder [facility])    Past Surgical History:  Procedure Laterality Date   CATARACT EXTRACTION W/PHACO Right 03/26/2018   Procedure: CATARACT EXTRACTION PHACO AND INTRAOCULAR LENS PLACEMENT ([place]) RIGHT BORDERLINE DIABETIC;  Surgeon: Leandrew Koyanagi, MD;  Location: [facility];  Service: Ophthalmology;  Laterality: Right;   CATARACT EXTRACTION W/PHACO Left 04/23/2018   Procedure: CATARACT EXTRACTION PHACO AND INTRAOCULAR LENS PLACEMENT ([place])  LEFT BORDERLINE DIABETIC;  Surgeon: Leandrew Koyanagi, MD;  Location: [facility];  Service: Ophthalmology;  Laterality: Left;   COLONOSCOPY     COLONOSCOPY WITH PROPOFOL  N/A 08/21/2017   Procedure: COLONOSCOPY WITH PROPOFOL;  Surgeon: Manya Silvas, MD;  Location: [facility] ENDOSCOPY;  Service: Endoscopy;  Laterality: N/A;   EYE SURGERY     HERNIA REPAIR     inguinal and umbilical   RIB RESECTION N/A 08/20/2019   Procedure: removal of xyphoid process;  Surgeon: Nestor Lewandowsky, MD;  Location: [facility] ORS;  Service: Thoracic;  Laterality: N/A;   UMBILICAL HERNIA REPAIR N/A 06/22/2019   Procedure: OPEN HERNIA REPAIR UMBILICAL ADULT;  Surgeon: Olean Ree, MD;  Location: [facility] ORS;  Service: General;  Laterality: N/A;   UPPER GI ENDOSCOPY     Social History   Socioeconomic History   Marital status: Single    Spouse name: Not on file   Number of children: 0   Years of education: Not on file   Highest education level: Bachelor's degree (e.g., BA, AB, BS)  Occupational History   Occupation: disabled  Tobacco Use   Smoking status: Every Day    Packs/day: 1.50    Years: 34.00    Pack years: 51.00    Types: Cigarettes   Smokeless tobacco: Former    Types: Snuff  Vaping Use   Vaping Use: Former  Substance and Sexual Activity   Alcohol use: Yes    Alcohol/week: 6.0 standard drinks    Types: 6 Cans of beer per week    Comment: 3 beers maybe 2x a week   Drug use: Not Currently   Sexual activity: Not Currently  Other Topics Concern   Not on file  Social History Narrative   Not on file   Social Determinants of Health   Financial Resource Strain: Not on file  Food Insecurity: Not on file  Transportation Needs: Not on file  Physical Activity: Not on file  Stress: Not on file  Social Connections: Not on file  Intimate Partner Violence: Not on file   Family History  Problem Relation Age of Onset   Hyperlipidemia Mother    Hypertension Father    Diabetes Maternal Aunt    Dementia Maternal Grandmother    Heart disease Maternal Grandfather    Heart disease Paternal Grandmother    Stroke Paternal Grandfather    Diabetes Paternal Grandfather     Colon cancer Neg Hx    Esophageal cancer Neg Hx    Inflammatory bowel disease Neg Hx    Liver disease Neg Hx    Pancreatic cancer Neg Hx    Stomach cancer Neg  Hx    Rectal cancer Neg Hx    I have reviewed his medical, social, and family history in detail and updated the electronic medical record as necessary.    PHYSICAL EXAMINATION  BP 110/68   Pulse 65   Ht '5\' 8"'$  (1.727 m)   Wt 175 lb (79.4 kg)   SpO2 97%   BMI 26.61 kg/m  Wt Readings from Last 3 Encounters:  07/06/21 175 lb (79.4 kg)  07/02/21 178 lb 9.2 oz (81 kg)  06/28/21 178 lb (80.7 kg)  GEN: NAD, appears stated age, doesn't appear chronically ill, accompanied by his mother and father PSYCH: Cooperative, without pressured speech EYE: Conjunctivae pink, sclerae anicteric ENT: MMM CV: RR without R/Gs  RESP: CTAB posteriorly, with some wheezing apparent  GI: NABS, soft, protuberant abdomen, tenderness to palpation in midepigastrium upon light and deep palpation, volitional guarding present, no rebound  MSK/EXT: No lower extremity edema SKIN: No jaundice NEURO:  Alert & Oriented x 3, no focal deficits   REVIEW OF DATA  I reviewed the following data at the time of this encounter:  GI Procedures and Studies  Previously reviewed by Dr. Allen Norris with plan for follow-up colonoscopy 2023  Laboratory Studies  Reviewed those in epic and care everywhere  Imaging Studies  August 2022 MRI/MRCP IMPRESSION: 1. Prominent pancreatic duct measuring up to 0.6 cm in caliber. As on prior CT, suspect a calculus in the central pancreatic duct measuring approximately 0.5 cm. This was calcified and therefore better appreciated by prior CT. 2. No biliary tract calculi identified. No biliary ductal dilatation. 3. No acute inflammatory findings. 4. There is a 3.3 cm nonenhancing, exophytic fluid signal cyst of the pancreatic tail, consistent with a pancreatic pseudocyst given history of pancreatitis. This is stable on multiple  prior examinations dating back to at least 2019 and benign. No specific further follow-up or characterization is required. 5. Mild hepatic steatosis.  August 2022 CTAP IMPRESSION: Probable 6 mm calculus seen in main pancreatic duct in the pancreatic head resulting in pancreatic ductal dilatation. MRCP may be performed for further evaluation. Also noted is 3.7 cm cystic abnormality in the pancreatic tail which is slightly enlarged compared to prior exam and demonstrates small peripheral solid component. Further evaluation with MRI with and without gadolinium is recommended. Hepatic steatosis. Small nonobstructive left renal calculus. Aortic Atherosclerosis (ICD10-I70.0).  July 2020 CTAP IMPRESSION: 1. No acute findings are noted in the abdomen or pelvis to account for the patient's symptoms. 2. There is a small umbilical hernia containing only omental fat. No associated bowel incarceration or obstruction at this time. 3. Small cystic appearing lesion in the tail of the pancreas slightly increased in size compared to the prior study. Given the patient's prior history of pancreatitis, this is strongly favored to represent a pancreatic pseudocyst. Repeat pancreatic protocol CT or MRI of the abdomen with and without IV gadolinium with MRCP is recommended in 6 months to ensure the stability of this finding. This recommendation follows ACR consensus guidelines: Management of Incidental Pancreatic Cysts: A White Paper of the ACR Incidental Findings Committee. J Am Coll Radiol [id number].  4. Aortic atherosclerosis.   ASSESSMENT  Mr. Fahrer is a 55 y.o. male with a pmh significant for diabetes, hyperlipidemia, COPD, bipolar disorder, MDD/anxiety, prior PE, colon polyps (TAs), tobacco use disorder, alcohol use disorder, recurrent acute pancreatitis secondary to alcohol, ?PD stone and likely chronic pancreatitis, pancreatic tail cystic lesion (chronic). The patient is seen today for  evaluation and management of:  1. Alcohol-induced chronic pancreatitis ([place])   2. Pancreatic stones   3. Abnormal CT of the pancreas   4. Pancreatic cyst   5. Dilation of pancreatic duct   6. Generalized abdominal pain   7. Recurrent acute pancreatitis   8. Tobacco use disorder   9. Alcohol use disorder, moderate, dependence ([place])   10. Abnormal MRI of pancreas    The patient is hemodynamically stable.  The patient is adamant that he would like the pancreatic duct stone to be removed as soon as possible.  I told him and his parents that this was not the case.  Patient to continue to drink alcohol and have tobacco use are not primary candidates for pancreatic duct stone ERCP for attempt at removal.  They must show that they can be abstinent of alcohol for at least a few months and try to decrease tobacco use is much as possible before attempts at removal are performed by me.  This is because of the increased risk of ERCP for these individuals and the risk of recurrence of pancreatitis because they go back to drinking alcohol.  The patient has not drank any alcohol since his hospitalization and he and his parents confirm this.  It is interesting that his parents offer concerned that if the patient starts drinking alcohol that he should let us know because we may need to cancel procedures, so I have some concern about the patient's ability to maintain his abstinence from alcohol but time will tell.  Hopefully he is able to do this.  The risks of an ERCP were discussed at length, including but not limited to the risk of perforation, bleeding, abdominal pain, post-ERCP pancreatitis (while usually mild can be severe and even life threatening).  In regards to the risk of post ERCP pancreatitis in patients who undergo pancreatic ERCP the risk can be as high as 15% for pancreatitis.  Patient has a cystlike region in the tail of the pancreas likely a more chronic pseudocyst based on more recent MRI not showing  concerning findings but it is worthwhile for [place] to evaluate that area as well as the PD dilation to ensure that there are no subtle masses actually causing the narrowing is leading to the potential PD stone.  He has chronic pancreatitis based on having a PD stone but we will evaluate via EUS for other signs of chronic pancreatitis as well.  The risks of an EUS including intestinal perforation, bleeding, infection, aspiration, and medication effects were discussed as was the possibility it may not give a definitive diagnosis if a biopsy is performed.  When a biopsy of the pancreas is done as part of the EUS, there is an additional risk of pancreatitis at the rate of about 1-2%.  It was explained that procedure related pancreatitis is typically mild, although it can be severe and even life threatening, which is why we do not perform random pancreatic biopsies and only biopsy a lesion/area we feel is concerning enough to warrant the risk.  The patient is going to try to decrease his tobacco use as much as possible also.  I am willing to move forward with EUS/ERCP in 6 to 8 weeks to allow x2 the patient to have complete alcohol cessation.  I am willing to give a short 5-day course of oxycodone for the patient to try and get through the rest of this next week in an effort of trying to prevent him from needing to go back into the hospital.  If the patient continues to have abdominal pain however he may need updated imaging.  I am not going to be offering this patient any further narcotic medications.  He will need to discuss this with his PCP or his primary gastroenterologist or have a pain referral placed and he and his family understand this and agreed to this plan of action.  Time will tell if we will be able to help this patient but he needs to help himself first.  If he decides that he would like another referral to one of the other academic centers to discuss PD stone removal if more quickly he can let us know or  his primary gastroenterologist can place a referral to one of the centers should he reach out.  I also think that the patient's PCP should consider stopping Invokana as it is a risk factor for pancreatitis in some individuals and this note will be forwarded to them as well.     PLAN  Short 5-day course of oxycodone 5 mg every 8 hour (15 tablets total no additional refills to be given by me in the future again) Proceed with scheduling EUS/ERCP for attempt at PD stone removal/stenting and evaluation of PD dilation and pancreatic cyst in the next 6 to 8 weeks Complete alcohol cessation for next 8 weeks Work on tobacco use disorder and decrease use as able Low-fat diet Remain well-hydrated Patient's PCP should consider stopping Invokana due to risk of pancreatitis Consider PERT therapy in future   Orders Placed This Encounter  Procedures   Procedural/ Surgical Case Request: ENDOSCOPIC RETROGRADE CHOLANGIOPANCREATOGRAPHY (ERCP) WITH PROPOFOL, UPPER ESOPHAGEAL ENDOSCOPIC ULTRASOUND (EUS)   Ambulatory referral to Gastroenterology    New Prescriptions   No medications on file   Modified Medications   Modified Medication Previous Medication   OXYCODONE (ROXICODONE) 5 MG IMMEDIATE RELEASE TABLET oxyCODONE (ROXICODONE) 5 MG immediate release tablet      Take 1 tablet (5 mg total) by mouth every 8 (eight) hours as needed for up to 5 days.    Take 1 tablet (5 mg total) by mouth every 8 (eight) hours as needed for up to 7 days.    Planned Follow Up No follow-ups on file.   Total Time in Face-to-Face and in Coordination of Care for patient including independent/personal interpretation/review of prior testing, medical history, examination, medication adjustment, communicating results with the patient directly, and documentation with the EHR is 45 minutes.   Justice Britain, MD [facility] Office # [phone]

## 2021-07-06 NOTE — Patient Instructions (Signed)
You have been scheduled for an endoscopy. Please follow written instructions given to you at your visit today. If you use inhalers (even only as needed), please bring them with you on the day of your procedure.  We have sent the following medications to your pharmacy for you to pick up at your convenience: Oxycodone  If you are age 53 or older, your body mass index should be between 23-30. Your Body mass index is 26.61 kg/m. If this is out of the aforementioned range listed, please consider follow up with your Primary Care Provider.  If you are age 59 or younger, your body mass index should be between 19-25. Your Body mass index is 26.61 kg/m. If this is out of the aformentioned range listed, please consider follow up with your Primary Care Provider.   __________________________________________________________  The [facility] providers would like to encourage you to use [facility] to communicate with providers for non-urgent requests or questions.  Due to long hold times on the telephone, sending your provider a message by [facility] may be a faster and more efficient way to get a response.  Please allow 48 business hours for a response.  Please remember that this is for non-urgent requests.   Thank you for choosing me and [facility].  Dr. Rush Landmark

## 2021-07-06 NOTE — Telephone Encounter (Signed)
Prescription is pending waiting for signature.   Dr Rush Landmark  please advise  on enzyme testing? That was possibly discussed at visit today.

## 2021-07-06 NOTE — Telephone Encounter (Signed)
Patient called states when he was speaking with Dr. Rush Landmark they spoke about possibly getting a pancreatic enzyme test done that might help him but they got side tracked. He said he is interested in doing that if Dr. Rush Landmark says ok also inquired abut the Oxycodone medication asked if it can be sent to [facility] before 5:00 pm.

## 2021-07-06 NOTE — Telephone Encounter (Signed)
[place], I think what he was mentioning was the possibility of pancreatic enzyme replacement therapy.  No enzyme testing was going to be offered. No change in the plan of action at this time. I have signed the prescription for the oxycodone. Thanks. GM

## 2021-07-07 ENCOUNTER — Ambulatory Visit: Payer: Medicare Other | Admitting: Family Medicine

## 2021-07-07 ENCOUNTER — Telehealth: Payer: Self-pay

## 2021-07-07 NOTE — Telephone Encounter (Signed)
I left a message asking the patient to call to reschedule his AWV.

## 2021-07-07 NOTE — Telephone Encounter (Signed)
Pt has been informed that prescription for Oxycodone was sent to pharmacy. Also informed pt per Dr Rush Landmark they did discuss possibility of pancreatic enzyme replacement therapy, not enzyme testing. No change in the plan of action at this time.

## 2021-07-07 NOTE — Telephone Encounter (Signed)
Pt has been scheduled. Sat 9/10 at 1 pm

## 2021-07-11 ENCOUNTER — Ambulatory Visit: Payer: Medicare Other

## 2021-07-12 ENCOUNTER — Emergency Department
Admission: EM | Admit: 2021-07-12 | Discharge: 2021-07-12 | Disposition: A | Discharge status: Home / Self Care | Payer: Medicare Other | Attending: Emergency Medicine | Admitting: Emergency Medicine

## 2021-07-12 ENCOUNTER — Other Ambulatory Visit: Payer: Self-pay

## 2021-07-12 ENCOUNTER — Encounter: Payer: Self-pay | Admitting: Emergency Medicine

## 2021-07-12 DIAGNOSIS — Z5321 Procedure and treatment not carried out due to patient leaving prior to being seen by health care provider: Secondary | ICD-10-CM | POA: Diagnosis not present

## 2021-07-12 DIAGNOSIS — R1013 Epigastric pain: Secondary | ICD-10-CM | POA: Insufficient documentation

## 2021-07-12 DIAGNOSIS — Z79899 Other long term (current) drug therapy: Secondary | ICD-10-CM | POA: Diagnosis not present

## 2021-07-12 LAB — CBC WITH DIFFERENTIAL/PLATELET
Abs Immature Granulocytes: 0.09 K/uL — ABNORMAL HIGH (ref 0.00–0.07)
Basophils Absolute: 0.1 K/uL (ref 0.0–0.1)
Basophils Relative: 1 %
Eosinophils Absolute: 0.2 K/uL (ref 0.0–0.5)
Eosinophils Relative: 3 %
HCT: 49.7 % (ref 39.0–52.0)
Hemoglobin: 18 g/dL — ABNORMAL HIGH (ref 13.0–17.0)
Immature Granulocytes: 1 %
Lymphocytes Relative: 30 %
Lymphs Abs: 2.2 K/uL (ref 0.7–4.0)
MCH: 34.5 pg — ABNORMAL HIGH (ref 26.0–34.0)
MCHC: 36.2 g/dL — ABNORMAL HIGH (ref 30.0–36.0)
MCV: 95.4 fL (ref 80.0–100.0)
Monocytes Absolute: 0.4 K/uL (ref 0.1–1.0)
Monocytes Relative: 6 %
Neutro Abs: 4.2 K/uL (ref 1.7–7.7)
Neutrophils Relative %: 59 %
Platelets: 187 K/uL (ref 150–400)
RBC: 5.21 MIL/uL (ref 4.22–5.81)
RDW: 13.6 % (ref 11.5–15.5)
WBC: 7.1 K/uL (ref 4.0–10.5)
nRBC: 0 % (ref 0.0–0.2)

## 2021-07-12 LAB — COMPREHENSIVE METABOLIC PANEL WITH GFR
ALT: 19 U/L (ref 0–44)
AST: 16 U/L (ref 15–41)
Albumin: 4.7 g/dL (ref 3.5–5.0)
Alkaline Phosphatase: 86 U/L (ref 38–126)
Anion gap: 13 (ref 5–15)
BUN: 9 mg/dL (ref 6–20)
CO2: 23 mmol/L (ref 22–32)
Calcium: 9.1 mg/dL (ref 8.9–10.3)
Chloride: 98 mmol/L (ref 98–111)
Creatinine, Ser: 0.6 mg/dL — ABNORMAL LOW (ref 0.61–1.24)
GFR, Estimated: >60 mL/min
Glucose, Bld: 388 mg/dL — ABNORMAL HIGH (ref 70–99)
Potassium: 4 mmol/L (ref 3.5–5.1)
Sodium: 134 mmol/L — ABNORMAL LOW (ref 135–145)
Total Bilirubin: 0.5 mg/dL (ref 0.3–1.2)
Total Protein: 7.7 g/dL (ref 6.5–8.1)

## 2021-07-12 LAB — URINALYSIS, ROUTINE W REFLEX MICROSCOPIC
Bilirubin Urine: NEGATIVE
Glucose, UA: >1000 mg/dL — AB
Hgb urine dipstick: NEGATIVE
Ketones, ur: NEGATIVE mg/dL
Leukocytes,Ua: NEGATIVE
Nitrite: NEGATIVE
Protein, ur: NEGATIVE mg/dL
Specific Gravity, Urine: <1.005 — ABNORMAL LOW (ref 1.005–1.030)
pH: 5 (ref 5.0–8.0)

## 2021-07-12 LAB — LIPASE, BLOOD: Lipase: 75 U/L — ABNORMAL HIGH (ref 11–51)

## 2021-07-12 LAB — URINE DRUG SCREEN, QUALITATIVE (ARMC ONLY)
Amphetamines, Ur Screen: NOT DETECTED
Barbiturates, Ur Screen: NOT DETECTED
Benzodiazepine, Ur Scrn: POSITIVE — AB
Cannabinoid 50 Ng, Ur ~~LOC~~: NOT DETECTED
Cocaine Metabolite,Ur ~~LOC~~: NOT DETECTED
MDMA (Ecstasy)Ur Screen: NOT DETECTED
Methadone Scn, Ur: NOT DETECTED
Opiate, Ur Screen: NOT DETECTED
Phencyclidine (PCP) Ur S: NOT DETECTED
Tricyclic, Ur Screen: NOT DETECTED

## 2021-07-12 LAB — TROPONIN I (HIGH SENSITIVITY): Troponin I (High Sensitivity): 4 ng/L

## 2021-07-12 LAB — ETHANOL: Alcohol, Ethyl (B): 238 mg/dL — ABNORMAL HIGH

## 2021-07-12 NOTE — ED Notes (Signed)
No answer when called several times from lobby 

## 2021-07-12 NOTE — ED Triage Notes (Signed)
Patient ambulatory to triage with steady gait, without difficulty or distress noted; surg sched 10/27 for removal of stone from pancreatic stone; c/o persistent upper abd pain; st that he is not taking oxycodone that was rx for him and instead wants to be admitted so he can have IV pain medications

## 2021-07-13 ENCOUNTER — Telehealth: Payer: Self-pay | Admitting: Gastroenterology

## 2021-07-13 ENCOUNTER — Encounter: Payer: Self-pay | Admitting: *Deleted

## 2021-07-13 ENCOUNTER — Other Ambulatory Visit: Payer: Self-pay

## 2021-07-13 ENCOUNTER — Telehealth: Payer: Self-pay | Admitting: Family Medicine

## 2021-07-13 ENCOUNTER — Emergency Department: Payer: Medicare Other

## 2021-07-13 DIAGNOSIS — Y906 Blood alcohol level of 120-199 mg/100 ml: Secondary | ICD-10-CM | POA: Insufficient documentation

## 2021-07-13 DIAGNOSIS — E1122 Type 2 diabetes mellitus with diabetic chronic kidney disease: Secondary | ICD-10-CM | POA: Insufficient documentation

## 2021-07-13 DIAGNOSIS — N189 Chronic kidney disease, unspecified: Secondary | ICD-10-CM | POA: Insufficient documentation

## 2021-07-13 DIAGNOSIS — Z79899 Other long term (current) drug therapy: Secondary | ICD-10-CM | POA: Diagnosis not present

## 2021-07-13 DIAGNOSIS — G8929 Other chronic pain: Secondary | ICD-10-CM | POA: Diagnosis not present

## 2021-07-13 DIAGNOSIS — F10129 Alcohol abuse with intoxication, unspecified: Secondary | ICD-10-CM | POA: Insufficient documentation

## 2021-07-13 DIAGNOSIS — R1013 Epigastric pain: Secondary | ICD-10-CM | POA: Diagnosis not present

## 2021-07-13 DIAGNOSIS — I129 Hypertensive chronic kidney disease with stage 1 through stage 4 chronic kidney disease, or unspecified chronic kidney disease: Secondary | ICD-10-CM | POA: Insufficient documentation

## 2021-07-13 DIAGNOSIS — Z794 Long term (current) use of insulin: Secondary | ICD-10-CM | POA: Insufficient documentation

## 2021-07-13 DIAGNOSIS — I10 Essential (primary) hypertension: Secondary | ICD-10-CM | POA: Diagnosis not present

## 2021-07-13 DIAGNOSIS — J449 Chronic obstructive pulmonary disease, unspecified: Secondary | ICD-10-CM | POA: Insufficient documentation

## 2021-07-13 DIAGNOSIS — R112 Nausea with vomiting, unspecified: Secondary | ICD-10-CM | POA: Insufficient documentation

## 2021-07-13 DIAGNOSIS — F17210 Nicotine dependence, cigarettes, uncomplicated: Secondary | ICD-10-CM | POA: Insufficient documentation

## 2021-07-13 DIAGNOSIS — E1165 Type 2 diabetes mellitus with hyperglycemia: Secondary | ICD-10-CM | POA: Diagnosis not present

## 2021-07-13 DIAGNOSIS — R0789 Other chest pain: Secondary | ICD-10-CM | POA: Diagnosis not present

## 2021-07-13 DIAGNOSIS — R109 Unspecified abdominal pain: Secondary | ICD-10-CM | POA: Diagnosis present

## 2021-07-13 LAB — CBC
HCT: 51.9 % (ref 39.0–52.0)
Hemoglobin: 18.7 g/dL — ABNORMAL HIGH (ref 13.0–17.0)
MCH: 34.4 pg — ABNORMAL HIGH (ref 26.0–34.0)
MCHC: 36 g/dL (ref 30.0–36.0)
MCV: 95.4 fL (ref 80.0–100.0)
Platelets: 201 10*3/uL (ref 150–400)
RBC: 5.44 MIL/uL (ref 4.22–5.81)
RDW: 13.3 % (ref 11.5–15.5)
WBC: 8.2 10*3/uL (ref 4.0–10.5)
nRBC: 0 % (ref 0.0–0.2)

## 2021-07-13 LAB — BASIC METABOLIC PANEL
Anion gap: 15 (ref 5–15)
BUN: 7 mg/dL (ref 6–20)
CO2: 23 mmol/L (ref 22–32)
Calcium: 9.6 mg/dL (ref 8.9–10.3)
Chloride: 97 mmol/L — ABNORMAL LOW (ref 98–111)
Creatinine, Ser: 0.62 mg/dL (ref 0.61–1.24)
GFR, Estimated: >60 mL/min (ref >60)
Glucose, Bld: 428 mg/dL — ABNORMAL HIGH (ref 70–99)
Potassium: 4.3 mmol/L (ref 3.5–5.1)
Sodium: 135 mmol/L (ref 135–145)

## 2021-07-13 LAB — TROPONIN I (HIGH SENSITIVITY): Troponin I (High Sensitivity): 5 ng/L (ref <18)

## 2021-07-13 LAB — LIPASE, BLOOD: Lipase: 67 U/L — ABNORMAL HIGH (ref 11–51)

## 2021-07-13 NOTE — Telephone Encounter (Signed)
Medication Refill - Medication:  oxyCODONE (ROXICODONE) 5 MG immediate release tablet  *Pt caled in stating he was seen in the er and was giving this medication but only was giving 5 tablets and pt states he still not feeling completely well and wanted a refill*  Has the patient contacted their pharmacy? No.  Preferred Pharmacy (with phone number or street name):  [place], [place].  Phone:  [phone] Fax:  [phone]  Agent: Please be advised that RX refills may take up to 3 business days. We ask that you follow-up with your pharmacy.

## 2021-07-13 NOTE — Telephone Encounter (Signed)
Patient called and advised the Oxycodone is no longer on his current medication list that it was discontinued after it's completion on 07/11/21. He asked who prescribed it, I advised the hospital physician. He says yes, he received in while he was in the emergency room and was told to follow up with his provider for additional medication. I advised I will send this to his provider for recommendation on refills. He asked when will he know anything because he needs this for pain control until is surgery on 08/31/21. I advised someone will call if not tomorrow at least by the first of next week to let him know what the provider recommends. He says if he doesn't hear from anyone tomorrow then he will go back to the emergency room at the hospital.

## 2021-07-13 NOTE — Telephone Encounter (Signed)
Pt called and stated that he is in pain. He states that he would like a call back from a nurse. Patient states that he spoke with Dr Durwin Reges in the hospital when he was admitted and states that he would be in pain and possible need medication. Please advise.

## 2021-07-13 NOTE — Telephone Encounter (Signed)
Pt. Calling about refills. He was prescri

## 2021-07-13 NOTE — Telephone Encounter (Signed)
Pt. Was previously released from the hospital. He says that the hospital would not give him a refill on his oxycodone hcl '5mg'$ . He is requesting to have his pain meds refilled or he is considering going back to the hospital to get pain meds.

## 2021-07-13 NOTE — ED Triage Notes (Signed)
Pt reports upper abdominal pain for about a month, with hx of pancreatic duct stone. Has had vomiting, taking Zofran. Tightness in his chest over the past day or so.

## 2021-07-13 NOTE — ED Notes (Signed)
Called pt in lobby and outside for lab redraw, pt not answering

## 2021-07-14 ENCOUNTER — Emergency Department
Admission: EM | Admit: 2021-07-14 | Discharge: 2021-07-14 | Disposition: A | Discharge status: Home / Self Care | Payer: Medicare Other | Attending: Emergency Medicine | Admitting: Emergency Medicine

## 2021-07-14 DIAGNOSIS — R1013 Epigastric pain: Secondary | ICD-10-CM | POA: Diagnosis not present

## 2021-07-14 DIAGNOSIS — F10929 Alcohol use, unspecified with intoxication, unspecified: Secondary | ICD-10-CM

## 2021-07-14 DIAGNOSIS — G8929 Other chronic pain: Secondary | ICD-10-CM

## 2021-07-14 DIAGNOSIS — R739 Hyperglycemia, unspecified: Secondary | ICD-10-CM

## 2021-07-14 DIAGNOSIS — R112 Nausea with vomiting, unspecified: Secondary | ICD-10-CM

## 2021-07-14 LAB — ETHANOL: Alcohol, Ethyl (B): 172 mg/dL — ABNORMAL HIGH (ref <10)

## 2021-07-14 LAB — HEPATIC FUNCTION PANEL
ALT: 17 U/L (ref 0–44)
AST: 14 U/L — ABNORMAL LOW (ref 15–41)
Albumin: 4.6 g/dL (ref 3.5–5.0)
Alkaline Phosphatase: 96 U/L (ref 38–126)
Bilirubin, Direct: <0.1 mg/dL (ref 0.0–0.2)
Total Bilirubin: 0.5 mg/dL (ref 0.3–1.2)
Total Protein: 7.6 g/dL (ref 6.5–8.1)

## 2021-07-14 MED ORDER — METOCLOPRAMIDE HCL 10 MG PO TABS: 10.0000 mg | ORAL_TABLET | Freq: Three times a day (TID) | ORAL | 1 refills | Status: DC | PRN

## 2021-07-14 MED ORDER — DIPHENHYDRAMINE HCL 25 MG PO CAPS
25.0000 mg | ORAL_CAPSULE | Freq: Once | ORAL | Status: AC
  Administered 2021-07-14: 25 mg via ORAL
  Filled 2021-07-14: qty 1

## 2021-07-14 MED ORDER — DROPERIDOL 2.5 MG/ML IJ SOLN
2.5000 mg | Freq: Once | INTRAMUSCULAR | Status: AC
  Administered 2021-07-14: 2.5 mg via INTRAMUSCULAR
  Filled 2021-07-14: qty 2

## 2021-07-14 MED ORDER — KETOROLAC TROMETHAMINE 30 MG/ML IJ SOLN
15.0000 mg | Freq: Once | INTRAMUSCULAR | Status: AC
  Administered 2021-07-14: 15 mg via INTRAMUSCULAR
  Filled 2021-07-14: qty 1

## 2021-07-14 MED ORDER — DICYCLOMINE HCL 10 MG PO CAPS: 10.0000 mg | ORAL_CAPSULE | Freq: Three times a day (TID) | ORAL | 0 refills | Status: DC | PRN

## 2021-07-14 NOTE — ED Notes (Signed)
In room with pt/md.

## 2021-07-14 NOTE — Telephone Encounter (Signed)
Does not seem appropriate. I would offer him a visit to assess, but would not Rx more narcotics without assessing and determining indication.

## 2021-07-14 NOTE — Telephone Encounter (Signed)
Per Dr. Allen Norris, he will not refill pt's Oxycodone '5mg'$ .

## 2021-07-14 NOTE — ED Notes (Signed)
Prior to medication rn asked if pt had a ride home, he stated that he had drove here but called his parents to come pick him up.

## 2021-07-14 NOTE — Telephone Encounter (Signed)
Patient was seen in ED last night and discharged after 2:30AM this morning. Patient was seen in ED for abdominal pain and prescribed Bentyl '10mg'$  and Reglan '10mg'$ . Please review ED note and advise if Oxycodone is appropriate at this time to take. KW

## 2021-07-14 NOTE — ED Provider Notes (Signed)
[facility] Emergency Department Provider Note  ____________________________________________   Event Date/Time   First MD Initiated Contact with Patient 07/14/21 [phone]     (approximate)  I have reviewed the triage vital signs and the nursing notes.   HISTORY  Chief Complaint Abdominal Pain    HPI Shaun Odonnell is a 55 y.o. male with medical and psychiatric history as listed below which notably includes both a pancreatic stone and prior episodes of pancreatitis as well as alcohol abuse and a documented history of drug-seeking behavior.  He presents tonight for persistent pain in the epigastrium.  He describes it as severe.  He has also had nausea and vomiting.  He reportedly is supposed to undergo a procedure with gastroenterology (Dr. Justice Britain) in about a month and a half to have the stone removed but he has been prescribed only a small amount of pain medication and he does not feel it is sufficient.  He feels his pain is not adequately controlled.  He denies fever, sore throat, and shortness of breath.  He reports some vague discomfort in his chest.  He describes the symptoms as severe and nothing particular makes it better or worse.     Past Medical History:  Diagnosis Date   Alcohol abuse 04/29/2019   Allergy    Anxiety    ARDS (adult respiratory distress syndrome) (HCC)    Bipolar disorder (HCC)    Borderline diabetes    Cataract    Chronic kidney disease    COPD (chronic obstructive pulmonary disease) (HCC)    chronic cough and wheezing   Depression    Diabetes mellitus without complication (HCC)    Dizziness    medication related   Dyspnea    with exertion   Elevated lipids    Fatty liver    Hypercholesterolemia    Hypertension    Pancreatitis    Pneumonia    in past   Pre-diabetes    diet controlled   Pulmonary embolism ([place])    Schizoaffective disorder [facility])     Patient Active Problem List   Diagnosis Date Noted    Alcohol use disorder, moderate, dependence ([place]) 07/06/2021   Tobacco use disorder 07/06/2021   Abnormal MRI of pancreas 07/06/2021   Recurrent acute pancreatitis 07/06/2021   Generalized abdominal pain 07/06/2021   Dilation of pancreatic duct 07/06/2021   Pancreatic cyst 07/06/2021   Abnormal CT of the abdomen 07/06/2021   Paroxysmal SVT (supraventricular tachycardia) ([place]) 06/30/2021   Pancreatic stones 06/28/2021   Diabetes mellitus without complication ([place]) [id number]   Tobacco abuse 06/28/2021   Hypertriglyceridemia 06/28/2021   Schizoaffective disorder ([place])    Palpitations 06/05/2021   Dizziness 06/05/2021   Drug-seeking behavior 09/17/2019   Hyperlipidemia associated with type 2 diabetes mellitus ([place]) 09/17/2019   Xyphoidalgia    Type 2 diabetes mellitus with hyperglycemia, without long-term current use of insulin ([place]) 04/29/2019   Chronic obstructive pulmonary disease ([place]) 04/29/2019   Alcohol abuse 04/29/2019   Bipolar depression ([place]) [id number]   Umbilical hernia without obstruction and without gangrene 04/23/2019   Acute pancreatitis 12/31/2018   Pancreatitis 07/02/2018    Past Surgical History:  Procedure Laterality Date   CATARACT EXTRACTION W/PHACO Right 03/26/2018   Procedure: CATARACT EXTRACTION PHACO AND INTRAOCULAR LENS PLACEMENT ([place]) RIGHT BORDERLINE DIABETIC;  Surgeon: Leandrew Koyanagi, MD;  Location: [facility];  Service: Ophthalmology;  Laterality: Right;   CATARACT EXTRACTION W/PHACO Left 04/23/2018   Procedure: CATARACT EXTRACTION PHACO AND  INTRAOCULAR LENS PLACEMENT ([facility])  LEFT BORDERLINE DIABETIC;  Surgeon: Leandrew Koyanagi, MD;  Location: [place];  Service: Ophthalmology;  Laterality: Left;   COLONOSCOPY     COLONOSCOPY WITH PROPOFOL N/A 08/21/2017   Procedure: COLONOSCOPY WITH PROPOFOL;  Surgeon: Manya Silvas, MD;  Location: [facility] ENDOSCOPY;  Service: Endoscopy;  Laterality: N/A;   EYE SURGERY     HERNIA  REPAIR     inguinal and umbilical   RIB RESECTION N/A 08/20/2019   Procedure: removal of xyphoid process;  Surgeon: Nestor Lewandowsky, MD;  Location: [facility] ORS;  Service: Thoracic;  Laterality: N/A;   UMBILICAL HERNIA REPAIR N/A 06/22/2019   Procedure: OPEN HERNIA REPAIR UMBILICAL ADULT;  Surgeon: Olean Ree, MD;  Location: [facility] ORS;  Service: General;  Laterality: N/A;   UPPER GI ENDOSCOPY      Prior to Admission medications   Medication Sig Start Date End Date Taking? Authorizing Provider  dicyclomine (BENTYL) 10 MG capsule Take 1 capsule (10 mg total) by mouth 3 (three) times daily as needed for up to 14 days for spasms. or abdominal pain 07/14/21 07/28/21 Yes Hinda Kehr, MD  metoCLOPramide (REGLAN) 10 MG tablet Take 1 tablet (10 mg total) by mouth every 8 (eight) hours as needed for nausea or vomiting. 07/14/21 07/14/22 Yes Hinda Kehr, MD  albuterol (PROVENTIL) (2.5 MG/3ML) 0.083% nebulizer solution Take 3 mLs (2.5 mg total) by nebulization every 6 (six) hours as needed for wheezing or shortness of breath. 05/18/21   Chesley Mires, MD  albuterol (VENTOLIN HFA) 108 (90 Base) MCG/ACT inhaler INHALE 2 PUFFS BY MOUTH EVERY 6 HOURS ASNEEDED WHEEZING/ SHORTNESS OF BREATH 03/02/21   Birdie Sons, MD  ALPRAZolam Duanne Moron) 0.5 MG tablet Take 0.5 mg by mouth 4 (four) times daily. Patient takes when wakes up(~0600)/ 1000/ 1600/ 2000 06/26/18   [provider]  budesonide-formoterol (SYMBICORT) 80-4.5 MCG/ACT inhaler Inhale 2 puffs into the lungs in the morning and at bedtime. 05/18/21   Chesley Mires, MD  carboxymethylcellulose (REFRESH PLUS) 0.5 % SOLN Place 2 drops into both eyes as needed (Dry eyes).    [provider]  Dulaglutide (TRULICITY) 3 0000000 SOPN Inject 3 mg into the skin once a week.    [provider]  glucose blood test strip Use as instructed 12/17/19   Trinna Post, PA-C  insulin glargine (LANTUS SOLOSTAR) 100 UNIT/ML Solostar Pen INJECT 10 UNITS  SUBCUTANEOUSLY AT BEDTIME 05/29/21   Virginia Crews, MD  Insulin Pen Needle (UNIFINE PENTIPS) 32G X 4 MM MISC USE WITH INSULIN ONCE DAILY 02/08/21   Mar Daring, PA-C  INVOKANA 300 MG TABS tablet TAKE 1 TABLET BY MOUTH ONCE DAILY BEFOREBREAFKAST 04/25/21   Chrismon, Vickki Muff, PA-C  meclizine (ANTIVERT) 25 MG tablet TAKE 2 TABLETS BY MOUTH 3 TIMES DAILY ASNEEDED 06/26/21   Gwyneth Sprout, FNP  OLANZapine (ZYPREXA) 20 MG tablet Take 20 mg by mouth at bedtime. 04/04/21   [provider]  ondansetron (ZOFRAN-ODT) 8 MG disintegrating tablet Take 1 tablet (8 mg total) by mouth every 8 (eight) hours as needed for nausea or vomiting. 07/02/21   Menshew, Dannielle Karvonen, PA-C  OneTouch Delica Lancets [id number] MISC USE TO CHECK FASTING SUGAR TWICE DAILY 12/07/20   Trinna Post, PA-C  sertraline (ZOLOFT) 100 MG tablet Take 100 mg by mouth daily.     [provider]  simvastatin (ZOCOR) 80 MG tablet TAKE 1 TABLET BY MOUTH AT BEDTIME 05/17/21   Bacigalupo, Dionne Bucy, MD  Allergies Fentanyl, Morphine and related, Clonazepam, Pimozide, Trifluoperazine, Azithromycin, Montelukast, Morphine, and Tramadol  Family History  Problem Relation Age of Onset   Hyperlipidemia Mother    Hypertension Father    Diabetes Maternal Aunt    Dementia Maternal Grandmother    Heart disease Maternal Grandfather    Heart disease Paternal Grandmother    Stroke Paternal Grandfather    Diabetes Paternal Grandfather    Colon cancer Neg Hx    Esophageal cancer Neg Hx    Inflammatory bowel disease Neg Hx    Liver disease Neg Hx    Pancreatic cancer Neg Hx    Stomach cancer Neg Hx    Rectal cancer Neg Hx     Social History Social History   Tobacco Use   Smoking status: Every Day    Packs/day: 1.50    Years: 34.00    Pack years: 51.00    Types: Cigarettes   Smokeless tobacco: Former    Types: Snuff  Vaping Use   Vaping Use: Former  Substance Use Topics   Alcohol use: Yes    Alcohol/week: 6.0  standard drinks    Types: 6 Cans of beer per week    Comment: 3 beers maybe 2x a week   Drug use: Not Currently    Review of Systems Constitutional: No fever/chills Eyes: No visual changes. ENT: No sore throat. Cardiovascular: Denies chest pain. Respiratory: Denies shortness of breath. Gastrointestinal: Persistent upper abdominal pain with nausea and vomiting.  Genitourinary: Negative for dysuria. Musculoskeletal: Negative for neck pain.  Negative for back pain. Integumentary: Negative for rash. Neurological: Negative for headaches, focal weakness or numbness.   ____________________________________________   PHYSICAL EXAM:  VITAL SIGNS: ED Triage Vitals [07/13/21 2218]  Enc Vitals Group     BP (!) 152/100     Pulse Rate 100     Resp 16     Temp 98.5 F (36.9 C)     Temp Source Oral     SpO2 98 %     Weight      Height      Head Circumference      Peak Flow      Pain Score 6     Pain Loc      Pain Edu?      Excl. in [place]?     Constitutional: Alert and oriented.  Ambulatory, appears uncomfortable. Eyes: Conjunctivae are normal.  Head: Atraumatic. Nose: No congestion/rhinnorhea. Mouth/Throat: Patient is wearing a mask. Neck: No stridor.  No meningeal signs.   Cardiovascular: Normal rate, regular rhythm. Good peripheral circulation. Respiratory: Normal respiratory effort.  No retractions. Gastrointestinal: Soft and nondistended.  Nonlocalized tenderness to palpation throughout the abdomen with no rebound or guarding.  Tenderness better with distraction. Musculoskeletal: No lower extremity tenderness nor edema. No gross deformities of extremities. Neurologic:  Normal speech and language. No gross focal neurologic deficits are appreciated.  Skin:  Skin is warm, dry and intact. Psychiatric: Mood and affect are normal. Speech and behavior are normal.  ____________________________________________   LABS (all labs ordered are listed, but only abnormal results are  displayed)  Labs Reviewed  BASIC METABOLIC PANEL - Abnormal; Notable for the following components:      Result Value   Chloride 97 (*)    Glucose, Bld 428 (*)    All other components within normal limits  CBC - Abnormal; Notable for the following components:   Hemoglobin 18.7 (*)    MCH 34.4 (*)    All other  components within normal limits  LIPASE, BLOOD - Abnormal; Notable for the following components:   Lipase 67 (*)    All other components within normal limits  HEPATIC FUNCTION PANEL - Abnormal; Notable for the following components:   AST 14 (*)    All other components within normal limits  ETHANOL - Abnormal; Notable for the following components:   Alcohol, Ethyl (B) 172 (*)    All other components within normal limits  TROPONIN I (HIGH SENSITIVITY)   ____________________________________________  EKG  ED ECG REPORT I, Hinda Kehr, the attending physician, personally viewed and interpreted this ECG.  Date: 07/13/2021 EKG Time: 22: 23 Rate: 92 Rhythm: normal sinus rhythm QRS Axis: normal Intervals: normal ST/T Wave abnormalities: Non-specific ST segment / T-wave changes, but no clear evidence of acute ischemia. Narrative Interpretation: no definitive evidence of acute ischemia; does not meet STEMI criteria.  ____________________________________________  RADIOLOGY I, Hinda Kehr, personally viewed and evaluated these images (plain radiographs) as part of my medical decision making, as well as reviewing the written report by the radiologist.  ED MD interpretation: No acute abnormalities on chest x-ray  Official radiology report(s): DG Chest 2 View  Result Date: 07/13/2021 CLINICAL DATA:  Chest tightness EXAM: CHEST - 2 VIEW COMPARISON:  05/18/2021 FINDINGS: Mild diffuse bronchitic changes. No focal opacity or pleural effusion. Normal heart size. Pneumothorax. IMPRESSION: No active cardiopulmonary disease. Electronically Signed   By: Donavan Foil M.D.   On:  07/13/2021 22:50    ____________________________________________   PROCEDURES   Procedure(s) performed (including Critical Care):  Procedures   ____________________________________________   INITIAL IMPRESSION / MDM / Center / ED COURSE  As part of my medical decision making, I reviewed the following data within the [facility] notes reviewed and incorporated, Labs reviewed , EKG interpreted , Old chart reviewed, Radiograph reviewed , Notes from prior ED visits, and [place] Controlled Substance Database   Differential diagnosis includes, but is not limited to, chronic pain, acute on chronic pain possibly due to acute pancreatitis, biliary colic, drug-seeking behavior, substance abuse disorder.  Reviewed the patient's medical record extensively.  He was seen by GI (Dr. Rush Landmark) in clinic about 8 days ago and they had an extensive conversation about how the patient needs to avoid drinking in order to get the ERCP that he wants in 6 to 8 weeks. Dr. Rush Landmark agreed to give him a prescription for 15 Percocet but told him that no additional pain medication will be prescribed and that he needs to follow-up with his PCP or pain management clinic.  There have been no multiple subsequent phone calls between the patient and clinics where he expresses desire for additional pain medicine and and at least 2 different notes they made the comment that the patient stated that if he was not prescribed more pain medicine he was going to go back to the emergency department.  The patient's vital signs are all stable.  He is reporting persistent and chronic pain in the setting of finishing his course of narcotics.  I reviewed the [place] controlled substance database and see that he has been given 3 different prescriptions for narcotics in the last 2 to 3 weeks and it was explained to him as an outpatient that he would not be getting additional prescriptions from the  gastroenterologist.  The patient's labs are stable, most notable for hyperglycemia but otherwise normal basic metabolic and hepatic function panel.  CBC is normal other than hemoglobin of 18.7  which could reflect some hemoconcentration.  His lipase is 67 which seems to be about baseline if you look back through his prior values.  Unfortunately his alcohol level is elevated at 172 tonight.  High-sensitivity troponin is normal.  I personally reviewed the patient's imaging and agree with the radiologist's interpretation that his chest x-ray is normal with no evidence of acute abnormality.  Nonischemic EKG.  I sympathized with the patient but explained that the emergency department cannot treat chronic pain with narcotics prescriptions as per the [place] chronic pain policy.  Given that there have been multiple telephone messages over the last week to outpatient clinics regarding pain management, I do not believe that his presentation tonight is indicative of an acute issue.  I offered to treat with nonnarcotic medications such as Bentyl and Zofran.  I reminded him of the recommendations from Dr. Allen Norris such as sticking with a clear liquid diet if he starts to have recurrence of symptoms.  I will also refer him to the pain management clinic.  Given his hemoconcentration, I offered to provide IV fluids but I explained that we will be sticking with IV fluids and nonnarcotic medication.  Alternatively, if he does not want to stay, I can give him intramuscular injections.  He declined the IV and said he would rather get the injection of medications.  I reiterated multiple times a chronic pain policy and that he will need to follow-up as an outpatient.  I provided droperidol 2.5 mg intramuscular and Toradol 30 mg intramuscular.  I pointed out to the patient that he is intoxicated tonight and needs to avoid alcohol if he is hoping to have his ERCP.  I also reminded him that he needs to call for a sober ride to pick  him up and he said he would call his parents to get him.  I gave my usual and customary follow-up recommendations and return precautions.         ____________________________________________  FINAL CLINICAL IMPRESSION(S) / ED DIAGNOSES  Final diagnoses:  Chronic abdominal pain  Non-intractable vomiting with nausea, unspecified vomiting type  Alcoholic intoxication with complication (HCC)  Hyperglycemia     MEDICATIONS GIVEN DURING THIS VISIT:  Medications  droperidol (INAPSINE) 2.5 MG/ML injection 2.5 mg (2.5 mg Intramuscular Given 07/14/21 0144)  ketorolac (TORADOL) 30 MG/ML injection 15 mg (15 mg Intramuscular Given 07/14/21 0144)  diphenhydrAMINE (BENADRYL) capsule 25 mg (25 mg Oral Given 07/14/21 0143)     ED Discharge Orders          Ordered    dicyclomine (BENTYL) 10 MG capsule  3 times daily PRN        07/14/21 0139    metoCLOPramide (REGLAN) 10 MG tablet  Every 8 hours PRN        07/14/21 0139             Note:  This document was prepared using Dragon voice recognition software and may include unintentional dictation errors.   Hinda Kehr, MD 07/14/21 [phone]

## 2021-07-14 NOTE — Discharge Instructions (Signed)
Please avoid alcohol use.  Stick with a clear liquid diet.  Try taking the medication prescribed and follow-up with your primary care doctor or the [facility] pain clinic to discuss pain management options and until you have your procedure.  Remember, as per [facility] policy, the emergency department cannot treat or prescribe medications for chronic pain.    Return to the emergency department if you develop new or worsening symptoms that concern you.

## 2021-07-15 ENCOUNTER — Ambulatory Visit (INDEPENDENT_AMBULATORY_CARE_PROVIDER_SITE_OTHER): Payer: Medicare Other | Admitting: *Deleted

## 2021-07-15 DIAGNOSIS — Z0000 Encounter for general adult medical examination without abnormal findings: Secondary | ICD-10-CM

## 2021-07-15 NOTE — Progress Notes (Signed)
Subjective:   Shaun Odonnell is a 55 y.o. male who presents for Medicare Annual/Subsequent preventive examination. I connected with  Shaun Odonnell on 07/15/21 by a Audio enabled telemedicine application and verified that I am speaking with the correct person using two identifiers.   I discussed the limitations of evaluation and management by telemedicine. The patient expressed understanding and agreed to proceed.   Location of patient: Home  Location of provider: office  Shaun Odonnell, Shaun Odonnell   Review of Systems     Defer to PCP       Objective:    Today's Vitals   07/15/21 0909  PainSc: 3    There is no height or weight on file to calculate BMI.  Advanced Directives 07/15/2021 07/12/2021 06/28/2021 06/27/2021 07/04/2020 12/10/2019 09/02/2019  Does Patient Have a Medical Advance Directive? No No No No No No No  Does patient want to make changes to medical advance directive? - - - - No - Patient declined - -  Would patient like information on creating a medical advance directive? No - Patient declined No - Patient declined No - Patient declined No - Patient declined - No - Patient declined No - Patient declined    Current Medications (verified) Outpatient Encounter Medications as of 07/15/2021  Medication Sig   albuterol (PROVENTIL) (2.5 MG/3ML) 0.083% nebulizer solution Take 3 mLs (2.5 mg total) by nebulization every 6 (six) hours as needed for wheezing or shortness of breath.   albuterol (VENTOLIN HFA) 108 (90 Base) MCG/ACT inhaler INHALE 2 PUFFS BY MOUTH EVERY 6 HOURS ASNEEDED WHEEZING/ SHORTNESS OF BREATH   ALPRAZolam (XANAX) 0.5 MG tablet Take 0.5 mg by mouth 4 (four) times daily. Patient takes when wakes up(~0600)/ 1000/ 1600/ 2000   budesonide-formoterol (SYMBICORT) 80-4.5 MCG/ACT inhaler Inhale 2 puffs into the lungs in the morning and at bedtime.   carboxymethylcellulose (REFRESH PLUS) 0.5 % SOLN Place 2 drops into both eyes as needed (Dry eyes).   dicyclomine  (BENTYL) 10 MG capsule Take 1 capsule (10 mg total) by mouth 3 (three) times daily as needed for up to 14 days for spasms. or abdominal pain   Dulaglutide (TRULICITY) 3 0000000 SOPN Inject 3 mg into the skin once a week.   glucose blood test strip Use as instructed   insulin glargine (LANTUS SOLOSTAR) 100 UNIT/ML Solostar Pen INJECT 10 UNITS SUBCUTANEOUSLY AT BEDTIME   Insulin Pen Needle (UNIFINE PENTIPS) 32G X 4 MM MISC USE WITH INSULIN ONCE DAILY   INVOKANA 300 MG TABS tablet TAKE 1 TABLET BY MOUTH ONCE DAILY BEFOREBREAFKAST   meclizine (ANTIVERT) 25 MG tablet TAKE 2 TABLETS BY MOUTH 3 TIMES DAILY ASNEEDED   metoCLOPramide (REGLAN) 10 MG tablet Take 1 tablet (10 mg total) by mouth every 8 (eight) hours as needed for nausea or vomiting.   OLANZapine (ZYPREXA) 20 MG tablet Take 20 mg by mouth at bedtime.   ondansetron (ZOFRAN-ODT) 8 MG disintegrating tablet Take 1 tablet (8 mg total) by mouth every 8 (eight) hours as needed for nausea or vomiting.   OneTouch Delica Lancets 99991111 MISC USE TO CHECK FASTING SUGAR TWICE DAILY   sertraline (ZOLOFT) 100 MG tablet Take 100 mg by mouth daily.    simvastatin (ZOCOR) 80 MG tablet TAKE 1 TABLET BY MOUTH AT BEDTIME   No facility-administered encounter medications on file as of 07/15/2021.    Allergies (verified) Fentanyl, Morphine and related, Clonazepam, Pimozide, Trifluoperazine, Azithromycin, Montelukast, Morphine, and Tramadol   History: Past Medical  History:  Diagnosis Date   Alcohol abuse 04/29/2019   Allergy    Anxiety    ARDS (adult respiratory distress syndrome) (HCC)    Bipolar disorder (HCC)    Borderline diabetes    Cataract    Chronic kidney disease    COPD (chronic obstructive pulmonary disease) (HCC)    chronic cough and wheezing   Depression    Diabetes mellitus without complication (HCC)    Dizziness    medication related   Dyspnea    with exertion   Elevated lipids    Fatty liver    Hypercholesterolemia    Hypertension     Pancreatitis    Pneumonia    in past   Pre-diabetes    diet controlled   Pulmonary embolism (HCC)    Schizoaffective disorder ([place])    Past Surgical History:  Procedure Laterality Date   CATARACT EXTRACTION W/PHACO Right 03/26/2018   Procedure: CATARACT EXTRACTION PHACO AND INTRAOCULAR LENS PLACEMENT ([place]) RIGHT BORDERLINE DIABETIC;  Surgeon: Leandrew Koyanagi, MD;  Location: [place];  Service: Ophthalmology;  Laterality: Right;   CATARACT EXTRACTION W/PHACO Left 04/23/2018   Procedure: CATARACT EXTRACTION PHACO AND INTRAOCULAR LENS PLACEMENT ([place])  LEFT BORDERLINE DIABETIC;  Surgeon: Leandrew Koyanagi, MD;  Location: [place];  Service: Ophthalmology;  Laterality: Left;   COLONOSCOPY     COLONOSCOPY WITH PROPOFOL N/A 08/21/2017   Procedure: COLONOSCOPY WITH PROPOFOL;  Surgeon: Manya Silvas, MD;  Location: [facility] ENDOSCOPY;  Service: Endoscopy;  Laterality: N/A;   EYE SURGERY     HERNIA REPAIR     inguinal and umbilical   RIB RESECTION N/A 08/20/2019   Procedure: removal of xyphoid process;  Surgeon: Nestor Lewandowsky, MD;  Location: [facility]S;  Service: Thoracic;  Laterality: N/A;   UMBILICAL HERNIA REPAIR N/A 06/22/2019   Procedure: OPEN HERNIA REPAIR UMBILICAL ADULT;  Surgeon: Olean Ree, MD;  Location: [facility];  Service: General;  Laterality: N/A;   UPPER GI ENDOSCOPY     Family History  Problem Relation Age of Onset   Hyperlipidemia Mother    Hypertension Father    Diabetes Maternal Aunt    Dementia Maternal Grandmother    Heart disease Maternal Grandfather    Heart disease Paternal Grandmother    Stroke Paternal Grandfather    Diabetes Paternal Grandfather    Colon cancer Neg Hx    Esophageal cancer Neg Hx    Inflammatory bowel disease Neg Hx    Liver disease Neg Hx    Pancreatic cancer Neg Hx    Stomach cancer Neg Hx    Rectal cancer Neg Hx    Social History   Socioeconomic History   Marital status: Single    Spouse name: Not on  file   Number of children: 0   Years of education: Not on file   Highest education level: Bachelor's degree (e.g., BA, AB, BS)  Occupational History   Occupation: disabled  Tobacco Use   Smoking status: Every Day    Packs/day: 1.50    Years: 34.00    Pack years: 51.00    Types: Cigarettes   Smokeless tobacco: Former    Types: Snuff  Vaping Use   Vaping Use: Former  Substance and Sexual Activity   Alcohol use: Yes    Alcohol/week: 6.0 standard drinks    Types: 6 Cans of beer per week    Comment: 3 beers maybe 2x a week   Drug use: Not Currently   Sexual activity: Not Currently  Other Topics Concern   Not on file  Social History Narrative   Not on file   Social Determinants of Health   Financial Resource Strain: Low Risk    Difficulty of Paying Living Expenses: Not hard at all  Food Insecurity: Not on file  Transportation Needs: No Transportation Needs   Lack of Transportation (Medical): No   Lack of Transportation (Non-Medical): No  Physical Activity: Inactive   Days of Exercise per Week: 0 days   Minutes of Exercise per Session: 0 min  Stress: Not on file  Social Connections: Socially Isolated   Frequency of Communication with Friends and Family: More than three times a week   Frequency of Social Gatherings with Friends and Family: Once a week   Attends Religious Services: Never   Marine scientist or Organizations: No   Attends Music therapist: Never   Marital Status: Never married    Tobacco Counseling Ready to quit: Not Answered Counseling given: Not Answered   Clinical Intake:     Pain : 0-10 Pain Score: 3  Pain Type: Chronic pain Pain Location: Abdomen Pain Orientation: Anterior Pain Descriptors / Indicators: Aching Pain Onset: Today Pain Frequency: Intermittent     Nutritional Risks: Unintentional weight loss Diabetes: Yes CBG done?: No Did pt. bring in CBG monitor from home?: No     Diabetic? Yes (Type 2 Diabetes  [place])         Activities of Daily Living In your present state of health, do you have any difficulty performing the following activities: 06/28/2021 12/13/2020  Hearing? N N  Vision? N Y  Difficulty concentrating or making decisions? N N  Walking or climbing stairs? N N  Dressing or bathing? N N  Doing errands, shopping? N N  Some recent data might be hidden    Patient Care Team: Gwyneth Sprout, FNP as PCP - General (Family Medicine) Leandrew Koyanagi, MD as Referring Physician (Ophthalmology)  Indicate any recent Medical Services you may have received from other than [facility] providers in the past year (date may be approximate).     Assessment:   This is a routine wellness examination for Shayn.  Hearing/Vision screen No results found.  Dietary issues and exercise activities discussed:     Goals Addressed   None    Depression Screen PHQ 2/9 Scores 07/15/2021 12/13/2020 09/12/2020 07/04/2020 12/10/2019 04/29/2019  PHQ - 2 Score '4 2 1 1 3 2  '$ PHQ- 9 Score '12 2 2 '$ - 8 3    Fall Risk Fall Risk  07/15/2021 12/13/2020 09/12/2020 07/04/2020 12/10/2019  Falls in the past year? 0 1 0 1 1  Number falls in past yr: 0 0 0 0 0  Injury with Fall? 0 1 0 0 1  Comment - - - - hit head on glass table - loss of consciousness  Risk for fall due to : - History of fall(s) No Fall Risks - -  Follow up - Falls evaluation completed;Education provided;Falls prevention discussed Falls evaluation completed Falls prevention discussed -    FALL RISK PREVENTION PERTAINING TO THE HOME:  Any stairs in or around the home? Yes  If so, are there any without handrails? No  Home free of loose throw rugs in walkways, pet beds, electrical cords, etc? Yes  Adequate lighting in your home to reduce risk of falls? Yes   ASSISTIVE DEVICES UTILIZED TO PREVENT FALLS:  Life alert? No  Use of a cane, walker or w/c? No  Grab  bars in the bathroom? No  Shower chair or bench in shower? No  Elevated toilet seat or a  handicapped toilet? No   TIMED UP AND GO:  Was the test performed?  N/A .  Length of time to ambulate 10 feet: N/A sec.     Cognitive Function:     6CIT Screen 07/15/2021  What Year? 0 points  What month? 0 points  What time? 0 points  Count back from 20 0 points  Months in reverse 0 points  Repeat phrase 2 points  Total Score 2    Immunizations Immunization History  Administered Date(s) Administered   Dtap, Unspecified 10/24/2011   Influenza Split 09/26/2015   Influenza,inj,Quad PF,6+ Mos 08/06/2019, 09/12/2020   Influenza-Unspecified 10/16/2010   PFIZER(Purple Top)SARS-COV-2 Vaccination 01/23/2020, 02/16/2020, 11/07/2020   Pneumococcal Polysaccharide-23 10/24/2012   Tdap 09/12/2020    TDAP status: Up to date  Flu Vaccine status: Due, Education has been provided regarding the importance of this vaccine. Advised may receive this vaccine at local pharmacy or Health Dept. Aware to provide a copy of the vaccination record if obtained from local pharmacy or Health Dept. Verbalized acceptance and understanding.  Pneumococcal vaccine status: Up to date  Covid-19 vaccine status: Completed vaccines  Qualifies for Shingles Vaccine? Yes   Zostavax completed No   Shingrix Completed?: No.    Education has been provided regarding the importance of this vaccine. Patient has been advised to call insurance company to determine out of pocket expense if they have not yet received this vaccine. Advised may also receive vaccine at local pharmacy or Health Dept. Verbalized acceptance and understanding.  Screening Tests Health Maintenance  Topic Date Due   Zoster Vaccines- Shingrix (1 of 2) Never done   COVID-19 Vaccine (4 - Booster for Pfizer series) 03/07/2021   OPHTHALMOLOGY EXAM  06/03/2021   INFLUENZA VACCINE  06/05/2021   Pneumococcal Vaccine 51-64 Years old (2 - PCV) 05/18/2022 (Originally 10/24/2013)   HEMOGLOBIN A1C  12/07/2021   FOOT EXAM  06/05/2022   URINE MICROALBUMIN   06/06/2022   COLONOSCOPY (Pts 45-54yr Insurance coverage will need to be confirmed)  08/22/2027   TETANUS/TDAP  09/12/2030   PNEUMOCOCCAL POLYSACCHARIDE VACCINE AGE 60-64 HIGH RISK  Completed   HIV Screening  Completed   HPV VACCINES  Aged Out    Health Maintenance  Health Maintenance Due  Topic Date Due   Zoster Vaccines- Shingrix (1 of 2) Never done   COVID-19 Vaccine (4 - Booster for Pfizer series) 03/07/2021   OPHTHALMOLOGY EXAM  06/03/2021   INFLUENZA VACCINE  06/05/2021   Colorectal cancer screening: Referral to GI placed N/A. Pt aware the office will call re: appt.   Lung Cancer Screening: (Low Dose CT Chest recommended if Age 53-80years, 30 pack-year currently smoking OR have quit w/in 15years.) does not qualify.   Lung Cancer Screening Referral: n/a  Additional Screening:  Hepatitis C Screening: does not qualify; Completed n/a  Vision Screening: Recommended annual ophthalmology exams for early detection of glaucoma and other disorders of the eye. Is the patient up to date with their annual eye exam?  Yes  Who is the provider or what is the name of the office in which the patient attends annual eye exams? 11/21 If pt is not established with a provider, would they like to be referred to a provider to establish care? No .   Dental Screening: Recommended annual dental exams for proper oral hygiene  Community Resource Referral / Chronic Care Management:  CRR required this visit?  No   CCM required this visit?  No      Plan:     I have personally reviewed and noted the following in the patient's chart:   Medical and social history Use of alcohol, tobacco or illicit drugs  Current medications and supplements including opioid prescriptions. Patient is not currently taking opioid prescriptions. Functional ability and status Nutritional status Physical activity Advanced directives List of other physicians Hospitalizations, surgeries, and ER visits in previous 12  months Vitals Screenings to include cognitive, depression, and falls Referrals and appointments  In addition, I have reviewed and discussed with patient certain preventive protocols, quality metrics, and best practice recommendations. A written personalized care plan for preventive services as well as general preventive health recommendations were provided to patient.     Elta Guadeloupe, [place]   07/15/2021   Nurse Notes: Non-Face to Face 45 minute visit  Mr. Clarkin , Thank you for taking time to come for your Medicare Wellness Visit. I appreciate your ongoing commitment to your health goals. Please review the following plan we discussed and let me know if I can assist you in the future.   These are the goals we discussed:  Goals       "I want to know what my blood sugar is supposed to be and learn how to make it better" (pt-stated)      Current Barriers:  Chronic Disease Management support and education needs related to Type II DM - patient calls today tfor assistance with DM management Reports fasting cbg = 240 this morning after having bologna/cheese sandwich for supper last night at 5:30pm and "Pure protein" bar for 10pm snack.  Reports "seeing a halo around my feet when I was propped up watching TV" after supper/before evening snack. Took 4oz apple juice and visual disturbance resolved. No other associated symptom reported. Asks "should I take another basaglar shot if my sugar is low"  Nurse Case Manager Clinical Goal(s):  Over the next 30 days, patient will verbalize understanding of plan for long term self health management of DMII Over the next 30 days, patient will meet with RN Care Manager to address education and care management/care coordination needs related to DMII Over the next 14 days, patient will attend all scheduled medical appointments: Carles Collet PA 05/15/19 @ 2:40pm Over the next 30 days, patient will demonstrate improved health management independence as evidenced  byadherence to recommend cbg self monitoring (daily each morning) and recording, improved adherence to recommended carb modified diet, verbalization of understanding of signs and symptoms of hypo and hyperglycemia  Interventions:  Reviewed diabetes medications and advised to NEVER adjust medications without direct supervision from provider Advised patient to continue monitoring and recording daily fasting (morning) cbg as advised by PCP Advised patient to continue food diary and cbg log Advised on carb modified diet including reduction of simple and processed carbohydrates, addition of non-starchy vegetables, reduction of added fats (likes to add "Velveeta Shells and Cheese" to several other dishes), decrease intake of sugary beverages Reviewed signs and symptoms of hypoglycemia and hyperglycemia and when to call provider  Patient Self Care Activities:  Self administers medications as prescribed Attends all scheduled provider appointments Monitors and records CBG Keeps food diary Calls pharmacy for medication refills Performs ADL's independently Performs IADL's independently Calls provider office for new concerns or questions  Please see past updates related to this goal by clicking on the "Past Updates" button in the selected goal  Prevent falls      Recommend to remove any items from the home that may cause slips or trips.        This is a list of the screening recommended for you and due dates:  Health Maintenance  Topic Date Due   Zoster (Shingles) Vaccine (1 of 2) Never done   COVID-19 Vaccine (4 - Booster for Pfizer series) 03/07/2021   Eye exam for diabetics  06/03/2021   Flu Shot  06/05/2021   Pneumococcal Vaccination (2 - PCV) 05/18/2022*   Hemoglobin A1C  12/07/2021   Complete foot exam   06/05/2022   Urine Protein Check  06/06/2022   Colon Cancer Screening  08/22/2027   Tetanus Vaccine  09/12/2030   Pneumococcal vaccine  Completed   HIV Screening  Completed    HPV Vaccine  Aged Out  *Topic was postponed. The date shown is not the original due date.

## 2021-07-15 NOTE — Patient Instructions (Signed)
Health Maintenance, Male Adopting a healthy lifestyle and getting preventive care are important in promoting health and wellness. Ask your health care provider about: The right schedule for you to have regular tests and exams. Things you can do on your own to prevent diseases and keep yourself healthy. What should I know about diet, weight, and exercise? Eat a healthy diet  Eat a diet that includes plenty of vegetables, fruits, low-fat dairy products, and lean protein. Do not eat a lot of foods that are high in solid fats, added sugars, or sodium. Maintain a healthy weight Body mass index (BMI) is a measurement that can be used to identify possible weight problems. It estimates body fat based on height and weight. Your health care provider can help determine your BMI and help you achieve or maintain a healthy weight. Get regular exercise Get regular exercise. This is one of the most important things you can do for your health. Most adults should: Exercise for at least 150 minutes each week. The exercise should increase your heart rate and make you sweat (moderate-intensity exercise). Do strengthening exercises at least twice a week. This is in addition to the moderate-intensity exercise. Spend less time sitting. Even light physical activity can be beneficial. Watch cholesterol and blood lipids Have your blood tested for lipids and cholesterol at 53 years of age, then have this test every 5 years. You may need to have your cholesterol levels checked more often if: Your lipid or cholesterol levels are high. You are older than 53 years of age. You are at high risk for heart disease. What should I know about cancer screening? Many types of cancers can be detected early and may often be prevented. Depending on your health history and family history, you may need to have cancer screening at various ages. This may include screening for: Colorectal cancer. Prostate cancer. Skin cancer. Lung  cancer. What should I know about heart disease, diabetes, and high blood pressure? Blood pressure and heart disease High blood pressure causes heart disease and increases the risk of stroke. This is more likely to develop in people who have high blood pressure readings, are of African descent, or are overweight. Talk with your health care provider about your target blood pressure readings. Have your blood pressure checked: Every 3-5 years if you are 18-39 years of age. Every year if you are 40 years old or older. If you are between the ages of 65 and 75 and are a current or former smoker, ask your health care provider if you should have a one-time screening for abdominal aortic aneurysm (AAA). Diabetes Have regular diabetes screenings. This checks your fasting blood sugar level. Have the screening done: Once every three years after age 45 if you are at a normal weight and have a low risk for diabetes. More often and at a younger age if you are overweight or have a high risk for diabetes. What should I know about preventing infection? Hepatitis B If you have a higher risk for hepatitis B, you should be screened for this virus. Talk with your health care provider to find out if you are at risk for hepatitis B infection. Hepatitis C Blood testing is recommended for: Everyone born from 1945 through 1965. Anyone with known risk factors for hepatitis C. Sexually transmitted infections (STIs) You should be screened each year for STIs, including gonorrhea and chlamydia, if: You are sexually active and are younger than 53 years of age. You are older than 53 years   of age and your health care provider tells you that you are at risk for this type of infection. Your sexual activity has changed since you were last screened, and you are at increased risk for chlamydia or gonorrhea. Ask your health care provider if you are at risk. Ask your health care provider about whether you are at high risk for HIV.  Your health care provider may recommend a prescription medicine to help prevent HIV infection. If you choose to take medicine to prevent HIV, you should first get tested for HIV. You should then be tested every 3 months for as long as you are taking the medicine. Follow these instructions at home: Lifestyle Do not use any products that contain nicotine or tobacco, such as cigarettes, e-cigarettes, and chewing tobacco. If you need help quitting, ask your health care provider. Do not use street drugs. Do not share needles. Ask your health care provider for help if you need support or information about quitting drugs. Alcohol use Do not drink alcohol if your health care provider tells you not to drink. If you drink alcohol: Limit how much you have to 0-2 drinks a day. Be aware of how much alcohol is in your drink. In the [place] one drink equals one 12 oz bottle of beer (355 mL), one 5 oz glass of wine (148 mL), or one 1 oz glass of hard liquor (44 mL). General instructions Schedule regular health, dental, and eye exams. Stay current with your vaccines. Tell your health care provider if: You often feel depressed. You have ever been abused or do not feel safe at home. Summary Adopting a healthy lifestyle and getting preventive care are important in promoting health and wellness. Follow your health care provider's instructions about healthy diet, exercising, and getting tested or screened for diseases. Follow your health care provider's instructions on monitoring your cholesterol and blood pressure. This information is not intended to replace advice given to you by your health care provider. Make sure you discuss any questions you have with your health care provider. Document Revised: 12/30/2020 Document Reviewed: 10/15/2018 Elsevier Patient Education  2022 [employer]vier Inc.  

## 2021-07-17 NOTE — Telephone Encounter (Signed)
Made appt for patient to be seen an evaluated by you on 07/19/21. KW

## 2021-07-19 ENCOUNTER — Ambulatory Visit (INDEPENDENT_AMBULATORY_CARE_PROVIDER_SITE_OTHER): Payer: Medicare Other | Admitting: Family Medicine

## 2021-07-19 ENCOUNTER — Encounter: Payer: Self-pay | Admitting: Family Medicine

## 2021-07-19 ENCOUNTER — Other Ambulatory Visit: Payer: Self-pay

## 2021-07-19 VITALS — BP 133/77 | HR 83 | Temp 98.4°F | Ht 68.0 in | Wt 173.3 lb

## 2021-07-19 DIAGNOSIS — Z720 Tobacco use: Secondary | ICD-10-CM | POA: Diagnosis not present

## 2021-07-19 DIAGNOSIS — K852 Alcohol induced acute pancreatitis without necrosis or infection: Secondary | ICD-10-CM | POA: Diagnosis not present

## 2021-07-19 DIAGNOSIS — I471 Supraventricular tachycardia: Secondary | ICD-10-CM | POA: Diagnosis not present

## 2021-07-19 DIAGNOSIS — F1010 Alcohol abuse, uncomplicated: Secondary | ICD-10-CM

## 2021-07-19 DIAGNOSIS — E1165 Type 2 diabetes mellitus with hyperglycemia: Secondary | ICD-10-CM

## 2021-07-19 DIAGNOSIS — R002 Palpitations: Secondary | ICD-10-CM

## 2021-07-19 DIAGNOSIS — F1020 Alcohol dependence, uncomplicated: Secondary | ICD-10-CM

## 2021-07-19 MED ORDER — OXYCODONE HCL 5 MG PO TABS: 5.0000 mg | ORAL_TABLET | Freq: Two times a day (BID) | ORAL | 0 refills | Status: DC | PRN

## 2021-07-19 NOTE — Assessment & Plan Note (Signed)
2x/day- brief fluttering

## 2021-07-19 NOTE — Assessment & Plan Note (Signed)
Recommend no alcohol to allow for procedure in October

## 2021-07-19 NOTE — Assessment & Plan Note (Signed)
Has appt scheduled for ERCP 10/27 Needs pain meds to get to that point Discussed diet and alcohol modification needed for surgery to take place

## 2021-07-19 NOTE — Assessment & Plan Note (Signed)
Off metformin d/t complaints of nausea Checks bg at home- range from 170-200s; highest number seen 250s

## 2021-07-19 NOTE — Assessment & Plan Note (Signed)
Advised smoking cessation Smoking 2 ppd Does not wish to stop at this time

## 2021-07-19 NOTE — Progress Notes (Signed)
Established patient visit   Patient: Shaun Odonnell   DOB: [birth year]   55 y.o. Male  MRN: [id number] Visit Date: 07/19/2021  Today's healthcare provider: Gwyneth Sprout, FNP   Chief Complaint  Patient presents with  . Pain Management   Subjective    HPI  Appetite change (decrease) due to abdominal pain and dislike of liquid diet. Patient would like a refill of HCL 5 mg  Medications: Outpatient Medications Prior to Visit  Medication Sig  . albuterol (PROVENTIL) (2.5 MG/3ML) 0.083% nebulizer solution Take 3 mLs (2.5 mg total) by nebulization every 6 (six) hours as needed for wheezing or shortness of breath.  Marland Kitchen albuterol (VENTOLIN HFA) 108 (90 Base) MCG/ACT inhaler INHALE 2 PUFFS BY MOUTH EVERY 6 HOURS ASNEEDED WHEEZING/ SHORTNESS OF BREATH  . ALPRAZolam (XANAX) 0.5 MG tablet Take 0.5 mg by mouth 4 (four) times daily. Patient takes when wakes up(~0600)/ 1000/ 1600/ 2000  . budesonide-formoterol (SYMBICORT) 80-4.5 MCG/ACT inhaler Inhale 2 puffs into the lungs in the morning and at bedtime.  . carboxymethylcellulose (REFRESH PLUS) 0.5 % SOLN Place 2 drops into both eyes as needed (Dry eyes).  . Dulaglutide (TRULICITY) 3 0000000 SOPN Inject 3 mg into the skin once a week.  Marland Kitchen glucose blood test strip Use as instructed  . insulin glargine (LANTUS SOLOSTAR) 100 UNIT/ML Solostar Pen INJECT 10 UNITS SUBCUTANEOUSLY AT BEDTIME  . Insulin Pen Needle (UNIFINE PENTIPS) 32G X 4 MM MISC USE WITH INSULIN ONCE DAILY  . INVOKANA 300 MG TABS tablet TAKE 1 TABLET BY MOUTH ONCE DAILY BEFOREBREAFKAST  . meclizine (ANTIVERT) 25 MG tablet TAKE 2 TABLETS BY MOUTH 3 TIMES DAILY ASNEEDED  . OLANZapine (ZYPREXA) 20 MG tablet Take 20 mg by mouth at bedtime.  . ondansetron (ZOFRAN-ODT) 8 MG disintegrating tablet Take 1 tablet (8 mg total) by mouth every 8 (eight) hours as needed for nausea or vomiting.  Glory Rosebush Delica Lancets [id number] MISC USE TO CHECK FASTING SUGAR TWICE DAILY  . sertraline (ZOLOFT) 100  MG tablet Take 100 mg by mouth daily.   . simvastatin (ZOCOR) 80 MG tablet TAKE 1 TABLET BY MOUTH AT BEDTIME  . [DISCONTINUED] dicyclomine (BENTYL) 10 MG capsule Take 1 capsule (10 mg total) by mouth 3 (three) times daily as needed for up to 14 days for spasms. or abdominal pain  . [DISCONTINUED] metoCLOPramide (REGLAN) 10 MG tablet Take 1 tablet (10 mg total) by mouth every 8 (eight) hours as needed for nausea or vomiting.   No facility-administered medications prior to visit.    Review of Systems  Last CBC Lab Results  Component Value Date   WBC 8.2 07/13/2021   HGB 18.7 (H) 07/13/2021   HCT 51.9 07/13/2021   MCV 95.4 07/13/2021   MCH 34.4 (H) 07/13/2021   RDW 13.3 07/13/2021   PLT 201 [id number]   Last metabolic panel Lab Results  Component Value Date   GLUCOSE 428 (H) 07/13/2021   NA 135 07/13/2021   K 4.3 07/13/2021   CL 97 (L) 07/13/2021   CO2 23 07/13/2021   BUN 7 07/13/2021   CREATININE 0.62 07/13/2021   GFRNONAA >60 07/13/2021   GFRAA 128 12/13/2020   CALCIUM 9.6 07/13/2021   PROT 7.6 07/13/2021   ALBUMIN 4.6 07/13/2021   LABGLOB 2.1 06/06/2021   AGRATIO 2.2 06/06/2021   BILITOT 0.5 07/13/2021   ALKPHOS 96 07/13/2021   AST 14 (L) 07/13/2021   ALT 17 07/13/2021   ANIONGAP 15 07/13/2021  Last lipids Lab Results  Component Value Date   CHOL 153 06/06/2021   HDL 37 (L) 06/06/2021   LDLCALC 85 06/06/2021   TRIG 189 (H) 06/29/2021   CHOLHDL 4.1 06/06/2021   Last hemoglobin A1c Lab Results  Component Value Date   HGBA1C 11.2 (H) 06/06/2021   Last thyroid functions Lab Results  Component Value Date   TSH 1.010 06/06/2021     Objective    BP 133/77 (BP Location: Right Arm, Patient Position: Sitting, Cuff Size: Normal)   Pulse 83   Temp 98.4 F (36.9 C) (Oral)   Ht '5\' 8"'$  (1.727 m)   Wt 173 lb 4.8 oz (78.6 kg)   SpO2 98%   BMI 26.35 kg/m  BP Readings from Last 3 Encounters:  07/19/21 133/77  07/14/21 127/82  07/12/21 125/82   Wt Readings  from Last 3 Encounters:  07/19/21 173 lb 4.8 oz (78.6 kg)  07/12/21 173 lb (78.5 kg)  07/06/21 175 lb (79.4 kg)      Physical Exam Vitals and nursing note reviewed.  Constitutional:      Appearance: Normal appearance. He is overweight.  HENT:     Head: Normocephalic and atraumatic.  Eyes:     Pupils: Pupils are equal, round, and reactive to light.  Cardiovascular:     Rate and Rhythm: Normal rate and regular rhythm.     Pulses: Normal pulses.     Heart sounds: Normal heart sounds.  Pulmonary:     Effort: Pulmonary effort is normal.     Breath sounds: Normal breath sounds.  Abdominal:     General: Bowel sounds are normal. There is distension.     Palpations: Abdomen is soft.     Tenderness: There is abdominal tenderness.     Hernia: A hernia is present.    Musculoskeletal:        General: Normal range of motion.     Cervical back: Normal range of motion.  Skin:    General: Skin is warm and dry.     Capillary Refill: Capillary refill takes less than 2 seconds.  Neurological:     General: No focal deficit present.     Mental Status: He is alert and oriented to person, place, and time. Mental status is at baseline.     No results found for any visits on 07/19/21.  Assessment & Plan     Problem List Items Addressed This Visit       Cardiovascular and Mediastinum   Paroxysmal SVT (supraventricular tachycardia) (HCC)    Sporadic flutter May ache L or R chest Not painful Feels MSK Denies CP, SOB, DOE        Digestive   Acute pancreatitis - Primary    Has appt scheduled for ERCP 10/27 Needs pain meds to get to that point Discussed diet and alcohol modification needed for surgery to take place      Relevant Medications   oxyCODONE (OXY IR/ROXICODONE) 5 MG immediate release tablet     Endocrine   Type 2 diabetes mellitus with hyperglycemia, without long-term current use of insulin (HCC)    Off metformin d/t complaints of nausea Checks bg at home- range from  170-200s; highest number seen 250s        Other   Alcohol abuse    Recommend no alcohol to allow for procedure in October      Palpitations    2x/day- brief fluttering      Tobacco abuse    Advised  smoking cessation Smoking 2 ppd Does not wish to stop at this time      Alcohol use disorder, moderate, dependence ([place])     Return in about 2 months (around 09/18/2021), or if symptoms worsen or fail to improve.     Vonna Kotyk, FNP, have reviewed all documentation for this visit. The documentation on 07/19/21 for the exam, diagnosis, procedures, and orders are all accurate and complete.    Gwyneth Sprout, [place] [phone] (phone) [phone] (fax)  [place]

## 2021-07-19 NOTE — Assessment & Plan Note (Signed)
Sporadic flutter May ache L or R chest Not painful Feels MSK Denies CP, SOB, DOE

## 2021-07-23 ENCOUNTER — Encounter: Payer: Self-pay | Admitting: Family Medicine

## 2021-07-24 ENCOUNTER — Other Ambulatory Visit: Payer: Self-pay | Admitting: Physician Assistant

## 2021-07-24 ENCOUNTER — Telehealth: Payer: Self-pay

## 2021-07-24 DIAGNOSIS — E1165 Type 2 diabetes mellitus with hyperglycemia: Secondary | ICD-10-CM

## 2021-07-24 NOTE — Telephone Encounter (Signed)
Pt stated at 0400 this am, pt stated he had a severe pain episode that caused him to double over. He is reqesting 7 day supply of Dilaudid to "stay on top of the pain." Pt requesting call when or if called in.

## 2021-07-24 NOTE — Telephone Encounter (Signed)
Copied from [place] [id number]. Topic: General - Other >> Jul 24, 2021  3:55 PM Shaun Odonnell A wrote: Reason for CRM: The patient would like to be contacted by staff member Sarah when available  Please contact further when possible  The patient shares that they have concerns related to prescription coordination

## 2021-07-24 NOTE — Telephone Encounter (Signed)
Please see previous telephone encounter, and advise. KW

## 2021-07-24 NOTE — Telephone Encounter (Signed)
This encounter was created in error - please disregard.

## 2021-07-25 ENCOUNTER — Ambulatory Visit: Payer: Self-pay | Admitting: *Deleted

## 2021-07-25 NOTE — Telephone Encounter (Signed)
Pt calling to request a one day supply of Dilaudid for today, until appt tomorrow. 4/10 pain. Reviewed earlier responses, declines ED. Aware needs to be seen first as discussed earlier encounters. "If I can just get doses for today." Pt would like CB.      Reason for Disposition  [1] Caller has medicine question about med NOT prescribed by PCP AND [2] triager unable to answer question (e.g., compatibility with other med, storage)  Answer Assessment - Initial Assessment Questions 1. NAME of MEDICATION: "What medicine are you calling about?"     Dilaudid, Wants one day supply for today until appt tomorrow 2. QUESTION: "What is your question?" (e.g., double dose of medicine, side effect)     *No Answer* 3. PRESCRIBING HCP: "Who prescribed it?" Reason: if prescribed by specialist, call should be referred to that group.     *No Answer* 4. SYMPTOMS: "Do you have any symptoms?"     *No Answer* 5. SEVERITY: If symptoms are present, ask "Are they mild, moderate or severe?"     *No Answer* 6. PREGNANCY:  "Is there any chance that you are pregnant?" "When was your last menstrual period?"     *No Answer*  Protocols used: Medication Question Call-A-AH

## 2021-07-25 NOTE — Telephone Encounter (Signed)
FYI, please see previous telephone encounters, Daneil Dan has reached out to patient in regards to pain management and medication. KW

## 2021-07-25 NOTE — Telephone Encounter (Signed)
Pt called to check status of refill request for Insulin Pen Needle (UNIFINE PENTIPS) 32G X 4 MM MISC/ he also stated he is in pain and asked if a one day supply of pain medication can be sent to [facility] as well for him today/ please advise

## 2021-07-25 NOTE — Telephone Encounter (Signed)
See message below from triage nurse. KW

## 2021-07-26 ENCOUNTER — Encounter: Payer: Self-pay | Admitting: Family Medicine

## 2021-07-26 ENCOUNTER — Other Ambulatory Visit: Payer: Self-pay

## 2021-07-26 ENCOUNTER — Telehealth: Payer: Self-pay | Admitting: Family Medicine

## 2021-07-26 ENCOUNTER — Ambulatory Visit (INDEPENDENT_AMBULATORY_CARE_PROVIDER_SITE_OTHER): Payer: Medicare Other | Admitting: Family Medicine

## 2021-07-26 ENCOUNTER — Ambulatory Visit: Payer: Medicare Other | Admitting: Gastroenterology

## 2021-07-26 ENCOUNTER — Other Ambulatory Visit: Payer: Self-pay | Admitting: Family Medicine

## 2021-07-26 VITALS — BP 123/69 | HR 74 | Temp 98.3°F | Ht 68.0 in | Wt 174.7 lb

## 2021-07-26 DIAGNOSIS — K852 Alcohol induced acute pancreatitis without necrosis or infection: Secondary | ICD-10-CM | POA: Diagnosis not present

## 2021-07-26 DIAGNOSIS — R1084 Generalized abdominal pain: Secondary | ICD-10-CM | POA: Diagnosis not present

## 2021-07-26 DIAGNOSIS — Z716 Tobacco abuse counseling: Secondary | ICD-10-CM | POA: Diagnosis not present

## 2021-07-26 MED ORDER — NICOTINE 21 MG/24HR TD PT24: 42.0000 mg | MEDICATED_PATCH | Freq: Every day | TRANSDERMAL | 3 refills | Status: DC

## 2021-07-26 MED ORDER — NICOTINE 10 MG IN INHA: 1.0000 | RESPIRATORY_TRACT | 3 refills | Status: DC | PRN

## 2021-07-26 NOTE — Telephone Encounter (Signed)
Pt saw Shaun Odonnell this morning.  Pt states the nicotine patches she prescribed are not covered by his insurance. The pharmacist told him that  21 mg Nicotrol cartridges are coved under his insurance. Pt hopes to get that called into pharmacy today.    [place], [place].

## 2021-07-26 NOTE — Assessment & Plan Note (Signed)
Chronic condition Recommend soft and liquid diet Patient reported pain following vegetable plate Reinforced diet and no alcohol

## 2021-07-26 NOTE — Assessment & Plan Note (Signed)
Taking oxy as prescribed; wants hydromorphone Advised that if pain is getting worse pt needs to be seen in ER for imaging; pt refused Pt refused referral back to gastro Wants 5 day supply minimum of HM for breakthrough; advised not available in [facility] setting given chronic nature of pain and no recent surgery Recommended UC/ER if pain continues to worsen to the point of activity deficit Reinforced diet

## 2021-07-26 NOTE — Progress Notes (Signed)
Established patient visit   Patient: Shaun Odonnell   DOB: 18-Jun-1966   55 y.o. Male  MRN: [id number] Visit Date: 07/26/2021  Today's healthcare provider: Gwyneth Sprout, FNP   Uncontrolled pain  Subjective    HPI  Patient says he was curled up in fetal position because pancreas was hurting and has been taking oxy as directed. Reports pain lasted until Saturday night. Would like meds that are stronger for when he does have bad attacks. Oxy is only giving him a mild relief. -Eye Exam: Patient states November 2022 will be a year -Patient states he will receive vaccines at pharmacy  Medications: Outpatient Medications Prior to Visit  Medication Sig   albuterol (PROVENTIL) (2.5 MG/3ML) 0.083% nebulizer solution Take 3 mLs (2.5 mg total) by nebulization every 6 (six) hours as needed for wheezing or shortness of breath.   albuterol (VENTOLIN HFA) 108 (90 Base) MCG/ACT inhaler INHALE 2 PUFFS BY MOUTH EVERY 6 HOURS ASNEEDED WHEEZING/ SHORTNESS OF BREATH   ALPRAZolam (XANAX) 0.5 MG tablet Take 0.5 mg by mouth 4 (four) times daily. Patient takes when wakes up(~0600)/ 1000/ 1600/ 2000   budesonide-formoterol (SYMBICORT) 80-4.5 MCG/ACT inhaler Inhale 2 puffs into the lungs in the morning and at bedtime.   carboxymethylcellulose (REFRESH PLUS) 0.5 % SOLN Place 2 drops into both eyes as needed (Dry eyes).   Dulaglutide (TRULICITY) 3 XN/2.3FT SOPN Inject 3 mg into the skin once a week.   glucose blood test strip Use as instructed   insulin glargine (LANTUS SOLOSTAR) 100 UNIT/ML Solostar Pen INJECT 10 UNITS SUBCUTANEOUSLY AT BEDTIME   Insulin Pen Needle (UNIFINE PENTIPS) 32G X 4 MM MISC USE WITH INSULIN ONCE DAILY   INVOKANA 300 MG TABS tablet TAKE 1 TABLET BY MOUTH ONCE DAILY BEFOREBREAFKAST   meclizine (ANTIVERT) 25 MG tablet TAKE 2 TABLETS BY MOUTH 3 TIMES DAILY ASNEEDED   OLANZapine (ZYPREXA) 20 MG tablet Take 20 mg by mouth at bedtime.   ondansetron (ZOFRAN-ODT) 8 MG disintegrating  tablet Take 1 tablet (8 mg total) by mouth every 8 (eight) hours as needed for nausea or vomiting.   OneTouch Delica Lancets 73U MISC USE TO CHECK FASTING SUGAR TWICE DAILY   oxyCODONE (OXY IR/ROXICODONE) 5 MG immediate release tablet Take 1 tablet (5 mg total) by mouth every 12 (twelve) hours as needed for severe pain.   sertraline (ZOLOFT) 100 MG tablet Take 100 mg by mouth daily.    simvastatin (ZOCOR) 80 MG tablet TAKE 1 TABLET BY MOUTH AT BEDTIME   No facility-administered medications prior to visit.    Review of Systems  Last CBC Lab Results  Component Value Date   WBC 8.2 07/13/2021   HGB 18.7 (H) 07/13/2021   HCT 51.9 07/13/2021   MCV 95.4 07/13/2021   MCH 34.4 (H) 07/13/2021   RDW 13.3 07/13/2021   PLT 201 20/25/4270   Last metabolic panel Lab Results  Component Value Date   GLUCOSE 428 (H) 07/13/2021   NA 135 07/13/2021   K 4.3 07/13/2021   CL 97 (L) 07/13/2021   CO2 23 07/13/2021   BUN 7 07/13/2021   CREATININE 0.62 07/13/2021   GFRNONAA >60 07/13/2021   GFRAA 128 12/13/2020   CALCIUM 9.6 07/13/2021   PROT 7.6 07/13/2021   ALBUMIN 4.6 07/13/2021   LABGLOB 2.1 06/06/2021   AGRATIO 2.2 06/06/2021   BILITOT 0.5 07/13/2021   ALKPHOS 96 07/13/2021   AST 14 (L) 07/13/2021   ALT 17 07/13/2021  ANIONGAP 15 07/13/2021   Last lipids Lab Results  Component Value Date   CHOL 153 06/06/2021   HDL 37 (L) 06/06/2021   LDLCALC 85 06/06/2021   TRIG 189 (H) 06/29/2021   CHOLHDL 4.1 06/06/2021   Last hemoglobin A1c Lab Results  Component Value Date   HGBA1C 11.2 (H) 06/06/2021   Last thyroid functions Lab Results  Component Value Date   TSH 1.010 06/06/2021   Last vitamin D No results found for: 25OHVITD2, 25OHVITD3, VD25OH Last vitamin B12 and Folate No results found for: VITAMINB12, FOLATE     Objective    BP 123/69 (BP Location: Right Arm, Patient Position: Sitting, Cuff Size: Normal)   Pulse 74   Temp 98.3 F (36.8 C) (Oral)   Ht 5\' 8"  (1.727 m)    Wt 174 lb 11.2 oz (79.2 kg)   SpO2 96%   BMI 26.56 kg/m  BP Readings from Last 3 Encounters:  07/26/21 123/69  07/19/21 133/77  07/14/21 127/82   Wt Readings from Last 3 Encounters:  07/26/21 174 lb 11.2 oz (79.2 kg)  07/19/21 173 lb 4.8 oz (78.6 kg)  07/12/21 173 lb (78.5 kg)      Physical Exam Vitals and nursing note reviewed.  Constitutional:      General: He is not in acute distress.    Appearance: Normal appearance. He is overweight.  HENT:     Head: Normocephalic and atraumatic.  Eyes:     Pupils: Pupils are equal, round, and reactive to light.  Cardiovascular:     Rate and Rhythm: Normal rate and regular rhythm.     Pulses: Normal pulses.     Heart sounds: Normal heart sounds.  Pulmonary:     Effort: Pulmonary effort is normal.     Breath sounds: Normal breath sounds.  Abdominal:     General: Bowel sounds are normal. There is no distension.     Palpations: Abdomen is soft. There is no mass.     Tenderness: There is no abdominal tenderness. There is no guarding.     Hernia: No hernia is present.  Musculoskeletal:        General: Normal range of motion.     Cervical back: Normal range of motion.  Skin:    General: Skin is warm and dry.     Capillary Refill: Capillary refill takes less than 2 seconds.  Neurological:     General: No focal deficit present.     Mental Status: He is alert and oriented to person, place, and time. Mental status is at baseline.  Psychiatric:        Mood and Affect: Mood normal.        Behavior: Behavior normal.        Thought Content: Thought content normal.        Judgment: Judgment normal.     No results found for any visits on 07/26/21.  Assessment & Plan     Problem List Items Addressed This Visit       Digestive   Acute pancreatitis    Chronic condition Recommend soft and liquid diet Patient reported pain following vegetable plate Reinforced diet and no alcohol         Other   Generalized abdominal pain -  Primary    Taking oxy as prescribed; wants hydromorphone Advised that if pain is getting worse pt needs to be seen in ER for imaging; pt refused Pt refused referral back to gastro Wants 5 day supply minimum of  HM for breakthrough; advised not available in [facility] setting given chronic nature of pain and no recent surgery Recommended UC/ER if pain continues to worsen to the point of activity deficit Reinforced diet      Encounter for smoking cessation counseling    Still smoking 2ppd; agreeable to try patch Order placed       Relevant Medications   nicotine (NICODERM CQ - DOSED IN MG/24 HOURS) 21 mg/24hr patch     Return if symptoms worsen or fail to improve.     Vonna Kotyk, FNP, have reviewed all documentation for this visit. The documentation on 07/26/21 for the exam, diagnosis, procedures, and orders are all accurate and complete.    Gwyneth Sprout, [place] [phone] (phone) [phone] (fax)  [place]

## 2021-07-26 NOTE — Assessment & Plan Note (Signed)
Still smoking 2ppd; agreeable to try patch Order placed

## 2021-07-27 ENCOUNTER — Ambulatory Visit: Payer: Medicare Other | Admitting: Pulmonary Disease

## 2021-07-29 ENCOUNTER — Other Ambulatory Visit: Payer: Self-pay

## 2021-07-29 ENCOUNTER — Emergency Department
Admission: EM | Admit: 2021-07-29 | Discharge: 2021-07-29 | Disposition: A | Discharge status: Home / Self Care | Payer: Medicare Other | Attending: Emergency Medicine | Admitting: Emergency Medicine

## 2021-07-29 ENCOUNTER — Telehealth: Payer: Self-pay | Admitting: Emergency Medicine

## 2021-07-29 ENCOUNTER — Encounter: Payer: Self-pay | Admitting: Family Medicine

## 2021-07-29 ENCOUNTER — Ambulatory Visit: Admission: RE | Admit: 2021-07-29 | Payer: Medicare Other | Source: Ambulatory Visit

## 2021-07-29 DIAGNOSIS — Z5321 Procedure and treatment not carried out due to patient leaving prior to being seen by health care provider: Secondary | ICD-10-CM | POA: Insufficient documentation

## 2021-07-29 DIAGNOSIS — Y908 Blood alcohol level of 240 mg/100 ml or more: Secondary | ICD-10-CM | POA: Insufficient documentation

## 2021-07-29 DIAGNOSIS — R404 Transient alteration of awareness: Secondary | ICD-10-CM | POA: Diagnosis not present

## 2021-07-29 DIAGNOSIS — Z79899 Other long term (current) drug therapy: Secondary | ICD-10-CM | POA: Diagnosis not present

## 2021-07-29 DIAGNOSIS — Y92002 Bathroom of unspecified non-institutional (private) residence single-family (private) house as the place of occurrence of the external cause: Secondary | ICD-10-CM | POA: Diagnosis not present

## 2021-07-29 DIAGNOSIS — W19XXXA Unspecified fall, initial encounter: Secondary | ICD-10-CM | POA: Diagnosis not present

## 2021-07-29 DIAGNOSIS — F10129 Alcohol abuse with intoxication, unspecified: Secondary | ICD-10-CM | POA: Insufficient documentation

## 2021-07-29 DIAGNOSIS — R0902 Hypoxemia: Secondary | ICD-10-CM | POA: Diagnosis not present

## 2021-07-29 DIAGNOSIS — R410 Disorientation, unspecified: Secondary | ICD-10-CM | POA: Diagnosis not present

## 2021-07-29 DIAGNOSIS — R0781 Pleurodynia: Secondary | ICD-10-CM | POA: Diagnosis not present

## 2021-07-29 DIAGNOSIS — Z743 Need for continuous supervision: Secondary | ICD-10-CM | POA: Diagnosis not present

## 2021-07-29 LAB — CBC
HCT: 51 % (ref 39.0–52.0)
Hemoglobin: 17.6 g/dL — ABNORMAL HIGH (ref 13.0–17.0)
MCH: 33.3 pg (ref 26.0–34.0)
MCHC: 34.5 g/dL (ref 30.0–36.0)
MCV: 96.6 fL (ref 80.0–100.0)
Platelets: 180 10*3/uL (ref 150–400)
RBC: 5.28 MIL/uL (ref 4.22–5.81)
RDW: 13.3 % (ref 11.5–15.5)
WBC: 8.2 10*3/uL (ref 4.0–10.5)
nRBC: 0 % (ref 0.0–0.2)

## 2021-07-29 LAB — COMPREHENSIVE METABOLIC PANEL
ALT: 18 U/L (ref 0–44)
AST: 16 U/L (ref 15–41)
Albumin: 4.5 g/dL (ref 3.5–5.0)
Alkaline Phosphatase: 94 U/L (ref 38–126)
Anion gap: 11 (ref 5–15)
BUN: 6 mg/dL (ref 6–20)
CO2: 26 mmol/L (ref 22–32)
Calcium: 9.1 mg/dL (ref 8.9–10.3)
Chloride: 102 mmol/L (ref 98–111)
Creatinine, Ser: 0.6 mg/dL — ABNORMAL LOW (ref 0.61–1.24)
GFR, Estimated: >60 mL/min (ref >60)
Glucose, Bld: 187 mg/dL — ABNORMAL HIGH (ref 70–99)
Potassium: 4.1 mmol/L (ref 3.5–5.1)
Sodium: 139 mmol/L (ref 135–145)
Total Bilirubin: 0.6 mg/dL (ref 0.3–1.2)
Total Protein: 7.8 g/dL (ref 6.5–8.1)

## 2021-07-29 LAB — URINE DRUG SCREEN, QUALITATIVE (ARMC ONLY)
Amphetamines, Ur Screen: NOT DETECTED
Barbiturates, Ur Screen: NOT DETECTED
Benzodiazepine, Ur Scrn: POSITIVE — AB
Cannabinoid 50 Ng, Ur ~~LOC~~: NOT DETECTED
Cocaine Metabolite,Ur ~~LOC~~: NOT DETECTED
MDMA (Ecstasy)Ur Screen: NOT DETECTED
Methadone Scn, Ur: NOT DETECTED
Opiate, Ur Screen: NOT DETECTED
Phencyclidine (PCP) Ur S: NOT DETECTED
Tricyclic, Ur Screen: NOT DETECTED

## 2021-07-29 LAB — ETHANOL: Alcohol, Ethyl (B): 305 mg/dL (ref <10)

## 2021-07-29 NOTE — ED Notes (Signed)
Pt was seen by multiple people walking out of ED across the parking lot

## 2021-07-29 NOTE — Telephone Encounter (Signed)
Lab called with critical ETOH level of 305. EDP Paduchowski informed of critical results. Pt LWBS.

## 2021-07-29 NOTE — ED Notes (Signed)
Pt called by this RN. Pt states he felt better so he left. Pt states no other concerns at this time.

## 2021-07-29 NOTE — ED Triage Notes (Addendum)
Pt comes via EMs with c/o alcohol intoxication. Friend went to pt's home for brunch and found the pt drunk. Pt got up and went to bathroom and fell. Pt did not hit his head, no LOC or blood thinners.  Pt did also states his prescribed pain meds. Pt states pain to upper left rib side.  20 g left AC, 200 fluids given  Pt states he drank 6 pack today

## 2021-07-31 ENCOUNTER — Telehealth: Payer: Self-pay

## 2021-07-31 ENCOUNTER — Encounter: Payer: Self-pay | Admitting: Family Medicine

## 2021-07-31 DIAGNOSIS — K0889 Other specified disorders of teeth and supporting structures: Secondary | ICD-10-CM

## 2021-07-31 NOTE — Telephone Encounter (Signed)
Copied from [place] [phone]. Topic: General - Call Back - No Documentation >> Jul 31, 2021  4:24 PM Erick Blinks wrote: Reason for CRM: Pt called and reported that the results of his heart monitor are in as of the end of august. He wants someone to call him and discuss his results. Please advise  [phone]

## 2021-08-01 NOTE — Telephone Encounter (Signed)
Shaun Odonnell has sent message to patient via mychart.

## 2021-08-03 ENCOUNTER — Other Ambulatory Visit: Payer: Self-pay | Admitting: Family Medicine

## 2021-08-03 DIAGNOSIS — E1165 Type 2 diabetes mellitus with hyperglycemia: Secondary | ICD-10-CM

## 2021-08-03 MED ORDER — MELOXICAM 15 MG PO TABS: 15.0000 mg | ORAL_TABLET | Freq: Every day | ORAL | 0 refills | Status: DC

## 2021-08-04 ENCOUNTER — Encounter: Payer: Self-pay | Admitting: Family Medicine

## 2021-08-10 ENCOUNTER — Encounter: Payer: Self-pay | Admitting: Family Medicine

## 2021-08-11 ENCOUNTER — Encounter: Payer: Self-pay | Admitting: General Surgery

## 2021-08-13 ENCOUNTER — Other Ambulatory Visit: Payer: Self-pay | Admitting: Family Medicine

## 2021-08-13 NOTE — Telephone Encounter (Signed)
Requested medication (s) are due for refill today: -  Requested medication (s) are on the active medication list: historical med  Last refill:  07/06/21  Future visit scheduled: yes  Notes to clinic: historical med and provider-- Prescribed at [facility] 3 PJ/0.9TO SOPN [Pharmacy Med Name: TRULICITY 3 IZ/1.2WP SUBQ SOLN ML] 2 mL     Sig: INJECT 1 PEN [place]     Endocrinology:  Diabetes - GLP-1 Receptor Agonists Failed - 08/13/2021  1:23 PM      Failed - HBA1C is between 0 and 7.9 and within 180 days    Hemoglobin A1C  Date Value Ref Range Status  08/03/2014 5.7 4.2 - 6.3 % Final    Comment:    The American Diabetes Association recommends that a primary goal of therapy should be <7% and that physicians should reevaluate the treatment regimen in patients with HbA1c values consistently >8%.    Hgb A1c MFr Bld  Date Value Ref Range Status  06/06/2021 11.2 (H) 4.8 - 5.6 % Final    Comment:             Prediabetes: 5.7 - 6.4          Diabetes: >6.4          Glycemic control for adults with diabetes: <7.0           Passed - Valid encounter within last 6 months    Recent Outpatient Visits           2 weeks ago Generalized abdominal pain   [facility] Tally Joe T, FNP   3 weeks ago Alcohol-induced acute pancreatitis, unspecified complication status   [facility] Gwyneth Sprout, FNP   2 months ago Encounter for annual physical exam   [facility], Dionne Bucy, MD   8 months ago Type 2 diabetes mellitus with hyperglycemia, without long-term current use of insulin [facility])   [place], [place], [place]   9 months ago Chronic obstructive pulmonary disease, unspecified COPD type [facility])   [place], PA-C       Future Appointments             In 4 days Chesley Mires, MD [place]   In 2 weeks Vigg, Customer service manager, MD [employer], [place]   In 1 month Bacigalupo, Dionne Bucy, MD [facility], [place]

## 2021-08-16 NOTE — Telephone Encounter (Signed)
Pt was advised by pharmacy to call PCP about refill for Dulaglutide (TRULICITY) 3 RF/1.6BW SOPN / pt stated he needs this before Friday / please advise and send to  [facility], [place]. Phone:  [phone]  Fax:  [phone]

## 2021-08-17 ENCOUNTER — Ambulatory Visit: Payer: Medicare Other | Admitting: Pulmonary Disease

## 2021-08-18 ENCOUNTER — Other Ambulatory Visit: Payer: Self-pay | Admitting: Family Medicine

## 2021-08-18 ENCOUNTER — Other Ambulatory Visit: Payer: Self-pay

## 2021-08-18 DIAGNOSIS — E1165 Type 2 diabetes mellitus with hyperglycemia: Secondary | ICD-10-CM

## 2021-08-18 MED ORDER — TRULICITY 3 MG/0.5ML ~~LOC~~ SOAJ: 3.0000 mg | SUBCUTANEOUS | 0 refills | Status: DC

## 2021-08-18 NOTE — Telephone Encounter (Signed)
Opened in error. KW °

## 2021-08-23 ENCOUNTER — Telehealth: Payer: Self-pay | Admitting: Gastroenterology

## 2021-08-23 NOTE — Telephone Encounter (Signed)
Return call to patient. Left message. NO refills will be given for Oxycodone per Dr.Mansouraty. Pt was made aware of this at his last visit as well.

## 2021-08-23 NOTE — Progress Notes (Signed)
Attempted to obtain medical history via telephone, unable to reach at this time. I left a voicemail to return pre surgical testing department's phone call.  

## 2021-08-23 NOTE — Telephone Encounter (Signed)
Patient called requesting to speak with someone regarding refilling the Oxycodone medication. Seeking advise.

## 2021-08-24 NOTE — Telephone Encounter (Signed)
Patient has been informed that no refills for Oxycodone will be given. Pt voiced understanding.

## 2021-08-25 ENCOUNTER — Ambulatory Visit: Payer: Medicare Other | Admitting: Endocrinology

## 2021-08-25 ENCOUNTER — Other Ambulatory Visit: Payer: Self-pay | Admitting: Family Medicine

## 2021-08-25 DIAGNOSIS — E1165 Type 2 diabetes mellitus with hyperglycemia: Secondary | ICD-10-CM

## 2021-08-25 MED ORDER — LANTUS SOLOSTAR 100 UNIT/ML ~~LOC~~ SOPN: PEN_INJECTOR | SUBCUTANEOUS | 0 refills | Status: DC

## 2021-08-28 ENCOUNTER — Encounter: Payer: Self-pay | Admitting: Internal Medicine

## 2021-08-28 ENCOUNTER — Telehealth (INDEPENDENT_AMBULATORY_CARE_PROVIDER_SITE_OTHER): Payer: Medicare Other | Admitting: Internal Medicine

## 2021-08-28 ENCOUNTER — Telehealth: Payer: Self-pay | Admitting: Gastroenterology

## 2021-08-28 ENCOUNTER — Other Ambulatory Visit: Payer: Self-pay | Admitting: Family Medicine

## 2021-08-28 DIAGNOSIS — K852 Alcohol induced acute pancreatitis without necrosis or infection: Secondary | ICD-10-CM

## 2021-08-28 DIAGNOSIS — R110 Nausea: Secondary | ICD-10-CM | POA: Insufficient documentation

## 2021-08-28 DIAGNOSIS — Z794 Long term (current) use of insulin: Secondary | ICD-10-CM

## 2021-08-28 DIAGNOSIS — E1169 Type 2 diabetes mellitus with other specified complication: Secondary | ICD-10-CM | POA: Diagnosis not present

## 2021-08-28 DIAGNOSIS — E1165 Type 2 diabetes mellitus with hyperglycemia: Secondary | ICD-10-CM | POA: Diagnosis not present

## 2021-08-28 DIAGNOSIS — E785 Hyperlipidemia, unspecified: Secondary | ICD-10-CM | POA: Diagnosis not present

## 2021-08-28 DIAGNOSIS — R42 Dizziness and giddiness: Secondary | ICD-10-CM

## 2021-08-28 MED ORDER — ONDANSETRON 8 MG PO TBDP: 8.0000 mg | ORAL_TABLET | Freq: Three times a day (TID) | ORAL | 0 refills | Status: DC | PRN

## 2021-08-28 MED ORDER — OXYCODONE HCL 5 MG PO TABS: 5.0000 mg | ORAL_TABLET | Freq: Two times a day (BID) | ORAL | 0 refills | Status: DC | PRN

## 2021-08-28 NOTE — Progress Notes (Signed)
There were no vitals taken for this visit.   Subjective:    Patient ID: Shaun Odonnell, male    DOB: Mar 06, 1966, 55 y.o.   MRN: [id number]  Chief Complaint  Patient presents with   New Patient (Initial Visit)    To est. Care Patient states taht he has a panreatic stone in his pancreatic duct.    HPI: GEOFREY Odonnell is a 55 y.o. male  Pt is new to the practice. Has a ho Chronic Pancreatitis/ DM/ Pancreatic duct stone / chronic ETOH abuse. Per chart review he was in the ER recenlty and left AMA.  Pt says he had an IPA with a friend, claims he " didn't know what it was  " was in the ER sec to his friend who called EMS as pt had a fall in the bathroom after he had alcohol.  Sept 1st seen GI @ [facility] and was told by GI in [place] and they referred to these specialists - diagnosed with Pancreatic stone pancreatic duct is enlarged. Was told by the hospital in august that he could mx pain @ home - was supposed to have surgery @ [place]  To have a surgrical removal of stone with  GI and to have them done@ multi step surgery per them - to have a stent in this Thursday to have - sees Dr. Rush Landmark @ [facility].  Apart from this he has a PMH of DM/ sees endocrinology for such a1c high at 8.5 Refill needed on oxycodone # 90 tabs were rx.   Diabetes He presents for his initial (8.5 last a1c to fu with endocrinology next week) diabetic visit. He has type 2 diabetes mellitus. Pertinent negatives for diabetes include no blurred vision, no chest pain, no fatigue, no foot paresthesias, no foot ulcerations, no polydipsia, no polyphagia, no polyuria, no visual change, no weakness and no weight loss. Symptoms are worsening.  Abdominal Pain This is a chronic (per pt he is on narcotics for pain and was adviced to refill these from his pcp. he says they were started in the hospital for his pain mx.) problem. Pertinent negatives include no weight loss.   Chief Complaint  Patient  presents with   New Patient (Initial Visit)    To est. Care Patient states taht he has a panreatic stone in his pancreatic duct.    Relevant past medical, surgical, family and social history reviewed and updated as indicated. Interim medical history since our last visit reviewed. Allergies and medications reviewed and updated.  Review of Systems  Constitutional:  Negative for fatigue and weight loss.  Eyes:  Negative for blurred vision.  Cardiovascular:  Negative for chest pain.  Gastrointestinal:  Positive for abdominal pain.  Endocrine: Negative for polydipsia, polyphagia and polyuria.  Neurological:  Negative for weakness.   Per HPI unless specifically indicated above     Objective:    There were no vitals taken for this visit.  Wt Readings from Last 3 Encounters:  07/26/21 174 lb 11.2 oz (79.2 kg)  07/19/21 173 lb 4.8 oz (78.6 kg)  07/12/21 173 lb (78.5 kg)    Physical Exam  Unable to peform sec to virtual visit.   Results for orders placed or performed during the hospital encounter of 59/93/57  Basic metabolic panel  Result Value Ref Range   Sodium 135 135 - 145 mmol/L   Potassium 4.3 3.5 - 5.1 mmol/L   Chloride 97 (L) 98 - 111 mmol/L   CO2 23  22 - 32 mmol/L   Glucose, Bld 428 (H) 70 - 99 mg/dL   BUN 7 6 - 20 mg/dL   Creatinine, Ser 0.62 0.61 - 1.24 mg/dL   Calcium 9.6 8.9 - 10.3 mg/dL   GFR, Estimated >60 >60 mL/min   Anion gap 15 5 - 15  CBC  Result Value Ref Range   WBC 8.2 4.0 - 10.5 K/uL   RBC 5.44 4.22 - 5.81 MIL/uL   Hemoglobin 18.7 (H) 13.0 - 17.0 g/dL   HCT 51.9 39.0 - 52.0 %   MCV 95.4 80.0 - 100.0 fL   MCH 34.4 (H) 26.0 - 34.0 pg   MCHC 36.0 30.0 - 36.0 g/dL   RDW 13.3 11.5 - 15.5 %   Platelets 201 150 - 400 K/uL   nRBC 0.0 0.0 - 0.2 %  Lipase, blood  Result Value Ref Range   Lipase 67 (H) 11 - 51 U/L  Hepatic function panel  Result Value Ref Range   Total Protein 7.6 6.5 - 8.1 g/dL   Albumin 4.6 3.5 - 5.0 g/dL   AST 14 (L) 15 - 41 U/L    ALT 17 0 - 44 U/L   Alkaline Phosphatase 96 38 - 126 U/L   Total Bilirubin 0.5 0.3 - 1.2 mg/dL   Bilirubin, Direct <0.1 0.0 - 0.2 mg/dL   Indirect Bilirubin NOT CALCULATED 0.3 - 0.9 mg/dL  Ethanol  Result Value Ref Range   Alcohol, Ethyl (B) 172 (H) <10 mg/dL  Troponin I (High Sensitivity)  Result Value Ref Range   Troponin I (High Sensitivity) 5 <18 ng/L        Current Outpatient Medications:    albuterol (PROVENTIL) (2.5 MG/3ML) 0.083% nebulizer solution, Take 3 mLs (2.5 mg total) by nebulization every 6 (six) hours as needed for wheezing or shortness of breath., Disp: 360 mL, Rfl: 5   albuterol (VENTOLIN HFA) 108 (90 Base) MCG/ACT inhaler, INHALE 2 PUFFS BY MOUTH EVERY 6 HOURS ASNEEDED WHEEZING/ SHORTNESS OF BREATH, Disp: 8.5 g, Rfl: 1   ALPRAZolam (XANAX) 0.5 MG tablet, Take 0.5 mg by mouth 4 (four) times daily. Patient takes when wakes up(~0600)/ 1000/ 1600/ 2000, Disp: , Rfl:    Azelastine-Fluticasone 137-50 MCG/ACT SUSP, Place 1 spray into the nose 2 (two) times daily as needed (allergies)., Disp: , Rfl:    budesonide-formoterol (SYMBICORT) 80-4.5 MCG/ACT inhaler, Inhale 2 puffs into the lungs in the morning and at bedtime., Disp: 1 each, Rfl: 12   carboxymethylcellulose (REFRESH PLUS) 0.5 % SOLN, Place 2 drops into both eyes daily as needed (Dry eyes)., Disp: , Rfl:    dextromethorphan-guaiFENesin (MUCINEX DM) 30-600 MG 12hr tablet, Take 1 tablet by mouth 2 (two) times daily as needed for cough., Disp: , Rfl:    Dulaglutide (TRULICITY) 3 KG/4.0NU SOPN, Inject 3 mg into the skin once a week. (Patient taking differently: Inject 3 mg into the skin every Friday.), Disp: 7 mL, Rfl: 0   glucose blood test strip, Use as instructed, Disp: 200 each, Rfl: 12   insulin glargine (LANTUS SOLOSTAR) 100 UNIT/ML Solostar Pen, INJECT 10 UNITS SUBCUTANEOUSLY AT BEDTIME, Disp: 9 mL, Rfl: 0   INVOKANA 300 MG TABS tablet, TAKE 1 TABLET BY MOUTH ONCE DAILY BEFOREBREAFKAST, Disp: 90 tablet, Rfl: 0    meclizine (ANTIVERT) 25 MG tablet, TAKE 2 TABLETS BY MOUTH 3 TIMES DAILY ASNEEDED, Disp: 60 tablet, Rfl: 0   meloxicam (MOBIC) 15 MG tablet, Take 1 tablet (15 mg total) by mouth daily. (Patient taking  differently: Take 15 mg by mouth daily as needed for pain.), Disp: 30 tablet, Rfl: 0   OLANZapine (ZYPREXA) 20 MG tablet, Take 20 mg by mouth at bedtime., Disp: , Rfl:    OneTouch Delica Lancets 81E MISC, USE TO CHECK FASTING SUGAR TWICE DAILY, Disp: 100 each, Rfl: 1   oxyCODONE (OXY IR/ROXICODONE) 5 MG immediate release tablet, Take 1 tablet (5 mg total) by mouth every 12 (twelve) hours as needed for severe pain., Disp: 90 tablet, Rfl: 0   sertraline (ZOLOFT) 100 MG tablet, Take 100 mg by mouth daily. , Disp: , Rfl:    simvastatin (ZOCOR) 80 MG tablet, TAKE 1 TABLET BY MOUTH AT BEDTIME, Disp: 90 tablet, Rfl: 1   ULTRACARE PEN NEEDLES 32G X 4 MM MISC, USE ONCE DAILY WITH INSULIN, Disp: 90 each, Rfl: 1   ondansetron (ZOFRAN-ODT) 8 MG disintegrating tablet, Take 1 tablet (8 mg total) by mouth every 8 (eight) hours as needed for nausea or vomiting., Disp: 30 tablet, Rfl: 0    Assessment & Plan:  Chronic pancreatitis : to fu with Gi  To have suregical excision / multi step procedure for procuring the stone.  Fu and mx per GI.   2. DM : chronic, uncontrolled  is on trulicity and lantus.  check HbA1c,  urine  microalbumin  diabetic diet plan given to pt  adviced regarding hypoglycemia and instructions given to pt today on how to prevent and treat the same if it were to occur. pt acknowledges the plan and voices understanding of the same.  exercise plan given and encouraged.   advice diabetic yearly podiatry, ophthalmology , nutritionist , dental check q 6 months,   3. Abdominal pain " chronic, stable. Was rx by his last provider @ [facility]. Will refill oxycode x 1 until gets established with Pain clinic which he was averse to vs obtianing such meds from his GI  To d/w his providers reg such  Request zofran  for nausea , refilled such   Problem List Items Addressed This Visit   None Visit Diagnoses     Nausea       Relevant Medications   ondansetron (ZOFRAN-ODT) 8 MG disintegrating tablet        No orders of the defined types were placed in this encounter.    Meds ordered this encounter  Medications   ondansetron (ZOFRAN-ODT) 8 MG disintegrating tablet    Sig: Take 1 tablet (8 mg total) by mouth every 8 (eight) hours as needed for nausea or vomiting.    Dispense:  30 tablet    Refill:  0     Follow up plan: No follow-ups on file.   This visit was completed via video visit through MyChart due to the restrictions of the COVID-19 pandemic. All issues as above were discussed and addressed. Physical exam was done as above through visual confirmation on video through MyChart. If it was felt that the patient should be evaluated in the office, they were directed there. The patient verbally consented to this visit. Location of the patient: home Location of the provider: home Those involved with this call:  Provider: Charlynne Cousins, MD CMA: Frazier Butt, [place] Desk/Registration: [employer]  Time spent on call including phone visit secondary to poor connection : 25  mins

## 2021-08-28 NOTE — Telephone Encounter (Signed)
Patient is scheduled for ERCP Thursday but wanted to inform you that he had a few beers (3-4  --  did not get drunk) on the following dates:  9/24, 10/8, and 10/21.  He wanted to know if that would interfere with the upcoming procedure?  Please call and advise.

## 2021-08-28 NOTE — Telephone Encounter (Signed)
The pt has been advised that he can proceed with the ERCP as planned however, he should avoid ETOH and follow the instructions from this point forward. The pt has been advised of the information and verbalized understanding.

## 2021-08-29 ENCOUNTER — Encounter: Payer: Self-pay | Admitting: Internal Medicine

## 2021-08-29 MED ORDER — MECLIZINE HCL 25 MG PO TABS: 25.0000 mg | ORAL_TABLET | Freq: Three times a day (TID) | ORAL | 3 refills | Status: DC | PRN

## 2021-08-29 NOTE — Telephone Encounter (Signed)
LOV: 07/26/2021  NOV: 09/14/2021   Last Refill: 06/26/2021 #60 0 Refills.

## 2021-08-30 ENCOUNTER — Encounter (HOSPITAL_COMMUNITY): Payer: Self-pay | Admitting: Gastroenterology

## 2021-08-30 NOTE — Anesthesia Preprocedure Evaluation (Addendum)
Anesthesia Evaluation  Patient identified by MRN, date of birth, ID band Patient awake    Reviewed: Allergy & Precautions, NPO status , Patient's Chart, lab work & pertinent test results  Airway Mallampati: II  TM Distance: >3 FB     Dental   Pulmonary shortness of breath, pneumonia, COPD, Current Smoker and Patient abstained from smoking.,    breath sounds clear to auscultation       Cardiovascular hypertension,  Rhythm:Regular Rate:Normal     Neuro/Psych PSYCHIATRIC DISORDERS    GI/Hepatic negative GI ROS, Neg liver ROS,   Endo/Other  diabetes  Renal/GU Renal disease     Musculoskeletal   Abdominal   Peds  Hematology   Anesthesia Other Findings   Reproductive/Obstetrics                           Anesthesia Physical Anesthesia Plan  ASA: 3  Anesthesia Plan: General   Post-op Pain Management:    Induction: Intravenous  PONV Risk Score and Plan: 2 and Ondansetron, Dexamethasone and Treatment may vary due to age or medical condition  Airway Management Planned: Oral ETT  Additional Equipment:   Intra-op Plan:   Post-operative Plan: Extubation in OR  Informed Consent: I have reviewed the patients History and Physical, chart, labs and discussed the procedure including the risks, benefits and alternatives for the proposed anesthesia with the patient or authorized representative who has indicated his/her understanding and acceptance.     Dental advisory given  Plan Discussed with: CRNA and Anesthesiologist  Anesthesia Plan Comments: (PAT note written 08/30/2021 by Myra Gianotti, PA-C. )      Anesthesia Quick Evaluation

## 2021-08-30 NOTE — Progress Notes (Signed)
DUE TO COVID-19 ONLY ONE VISITOR IS ALLOWED TO COME WITH YOU AND STAY IN THE WAITING ROOM ONLY DURING PRE OP AND PROCEDURE DAY OF SURGERY.   PCP - Tally Joe, FNP   Cardiologist - n/a Internal Med - Dr Loman Brooklyn Vigg  Chest x-ray - 07/13/21 (2V) EKG - 07/13/21 Stress Test - 10/2014 CE ECHO - 01/04/15 Cardiac Cath - n/a  ICD Pacemaker/Loop - n/a  Sleep Study -  n/a CPAP - none  Diabetes: Fasting Blood Sugar - unknown, pt does not check Checks Blood Sugar 0 times a day  Do not take Invokana or Trulicity on the morning of surgery.  THE NIGHT BEFORE SURGERY, take 5 units of Lantus.    Anesthesia review: Yes  STOP now taking any Aspirin (unless otherwise instructed by your surgeon), Aleve, Naproxen, Ibuprofen, Motrin, Advil, Goody's, BC's, all herbal medications, fish oil, and all vitamins.   Coronavirus Screening Covid test n/a Ambulatory Surgery  Do you have any of the following symptoms:  Cough yes/no: No Fever (>100.34F)  yes/no: No Runny nose yes/no: No Sore throat yes/no: No Difficulty breathing/shortness of breath  yes/no: No  Have you traveled in the last 14 days and where? yes/no: No  Patient verbalized understanding of instructions that were given via phone.

## 2021-08-30 NOTE — Progress Notes (Addendum)
Anesthesia Chart Review: SAME DAY WORK-UP  Case: [id number] Date/Time: 08/31/21 1000   Procedures:      ENDOSCOPIC RETROGRADE CHOLANGIOPANCREATOGRAPHY (ERCP) WITH PROPOFOL     UPPER ESOPHAGEAL ENDOSCOPIC ULTRASOUND (EUS)     FINE NEEDLE ASPIRATION (FNA) LINEAR   Anesthesia type: General   Pre-op diagnosis: Chronic Pancreatitis, Pancreatic Stone, Abnormal CT scan   Location: [facility] ROOM [facility] / [facility]   Surgeons: Mansouraty, Telford Nab., MD       DISCUSSION: Patient is a 55 year old male scheduled for the above procedure. History as below. Recent imaging showed prominent pancreatic duct with calculus suspected and stable 3.3 cm cystic lesion felt most consistent with pancreatic pseudocyst. The patient wantd the pancreatic duct stone removed asap. Dr. Rush Landmark recommended abstaining for alcohol for ~ 8 weeks and decrease tobacco use first "because of the increased risk of ERCP for these individuals and the risk of recurrence of pancreatitis because they go back to drinking alcohol." (He did notify GI on 08/28/21 that he has had 3-4 beers on 3 different dates since he saw Dr. Rush Landmark on 07/06/21.)   History includes smoking, COPD, DOE, PE (~ 2003), ARDS (~ 2003, in setting of pancreatitis), Schizoaffective disorder (unspecified type), Bipolar disorder, HTN, HLD, DM2, alcohol use disorder, fatty liver, pancreatitis (alcohol-induced chronic pancreatitis, recurrent acute pancreatitis; pancreatic pseudocyst and dilated pancreatic duct 9/41/74), umbilical hernia repair (06/22/19), xiphodynia (s/p excision of xiphoid process 08/20/19). UPDATE CLARIFICATION 09/04/21 9:32 AM regarding Schizoaffective disorder that was listed as part of patient's history: Schizoaffective disorder is listed in psychiatry notes dating back to at least 06/02/14. According to Marlowe Alt, MD note on 06/02/14, Schizoaffective disorder goes back to the early 1990's. The note does not further classify the type of Schizoaffective  disorder, so in this note, Schizoaffective disorder was classified as unspecified type.)   Last A1c 11.2% on 0/8/14, and Trulicity increased to 3 mg weekly with 3 month re-evaluation recommended. He is also on Lantus 10 mg Q HS and Invokana 300 mg daily. He does not check home CBGs. He will get a glucose on arrival for procedure.   Anesthesia team to evaluate on the day of surgery.    VS: Ht 5\' 8"  (1.727 m)   Wt 80.7 kg   BMI 27.06 kg/m  BP Readings from Last 3 Encounters:  07/26/21 123/69  07/19/21 133/77  07/14/21 127/82   Pulse Readings from Last 3 Encounters:  07/26/21 74  07/19/21 83  07/14/21 89     PROVIDERS: - Established care at [facility] on 08/28/21 with Charlynne Cousins, MD. Previously saw Gwyneth Sprout, FNP at [facility]. [name], MD is primary GI Chesley Mires, MD is pulmonologist. Initial evaluation 05/18/21. CXR and PFTs ordered. Symbicort BID and albuterol nebulizer as needed planned. Recommended discussion with Behavioral Health team if Chantix or bupropion could be considered to help with smoking cessation. 4 month follow-up planned.  - He was evaluated by cardiologist Serafina Royals, MD in 2019 for chest pain, and had a normal stress test.    LABS: Currently last lab results include: Lab Results  Component Value Date   WBC 8.2 07/29/2021   HGB 17.6 (H) 07/29/2021   HCT 51.0 07/29/2021   PLT 180 07/29/2021   GLUCOSE 187 (H) 07/29/2021   ALT 18 07/29/2021   AST 16 07/29/2021   NA 139 07/29/2021   K 4.1 07/29/2021   CL 102 07/29/2021   CREATININE 0.60 (L) 07/29/2021  BUN 6 07/29/2021   CO2 26 07/29/2021   TSH 1.010 06/06/2021   INR 0.9 06/28/2021   HGBA1C 11.2 (H) 06/06/2021    Spirometry 06/08/21: FVC 3.15 (67%), post 3.29 (70%). FEV1 2.27 (62%), post 2.58 (71%). FEV1/FVC 72%, post 78%. Summary: - Spirometry Data Is Acceptable and Reproducible - Mild/Moderate  Obstructive Airways Disease with Significant  Broncho-Dilator Response   IMAGES: CXR 07/13/21: FINDINGS: Mild diffuse bronchitic changes. No focal opacity or pleural effusion. Normal heart size. Pneumothorax. IMPRESSION: No active cardiopulmonary disease.   MR Abd/MRCP 06/28/21: IMPRESSION: 1. Prominent pancreatic duct measuring up to 0.6 cm in caliber. As on prior CT, suspect a calculus in the central pancreatic duct measuring approximately 0.5 cm. This was calcified and therefore better appreciated by prior CT. 2. No biliary tract calculi identified. No biliary ductal dilatation. 3. No acute inflammatory findings. 4. There is a 3.3 cm nonenhancing, exophytic fluid signal cyst of the pancreatic tail, consistent with a pancreatic pseudocyst given history of pancreatitis. This is stable on multiple prior examinations dating back to at least 2019 and benign. No specific further follow-up or characterization is required. 5. Mild hepatic steatosis.   EKG: EKG 07/13/21: Normal sinus rhythm ST & T wave abnormality, consider inferior ischemia Abnormal ECG - There is some baseline wanderer and artifact which may affect interpretation. Inferior t wave were flat on 07/12/21 tracing.   EKG 07/12/21: Normal sinus rhythm Possible Left atrial enlargement RSR' or QR pattern in V1 suggests right ventricular conduction delay Nonspecific T wave abnormality Abnormal ECG   CV: ZioXT Long term monitor 06/05/21-06/19/21:  Min HR of 47 bpm, max HR of 197 bpm, and avg HR of 77 bpm. Predominant underlying rhythm was sinus rhythm. 2 SVT runs occurred, the run with the fastest interval lasting 6 beasts with a max rate of 197 bpm (avg 184 bpm); the run with the fastest interval was also the longest.  Isolated SVEs were rare (< 1.0%), SVE couplets were rare (< 1.0%), and SVE triplets were rare (< 1.0%).  Isolated VEs were rare (< 1.0%), and no VE couplets or VE triplets were present.   ETT 06/10/18 (as outlined in 06/10/18 [facility] CE note by Dr.  Nehemiah Massed): "Regular Stress was performed showing Normal test."  Per Dr. Nehemiah Massed, "No further cardiac intervention at this time due to normal stress test without evidence of myocardial ischemia".   Nuclear stress test 01/04/15 ([facility] CE): Normal Lexus scan infusion EKG without evidence of ischemia or arrhythmia  Normal myocardial perfusion without evidence of myocardial ischemia    Echo 01/04/15 ([facility] CE): INTERPRETATION  NORMAL LEFT VENTRICULAR SYSTOLIC FUNCTION WITH AN ESTIMATED EF = 55 %  NORMAL RIGHT VENTRICULAR SYSTOLIC FUNCTION  MILD MITRAL VALVE INSUFFICIENCY  TRACE TRICUSPID VALVE INSUFFICIENCY  NO VALVULAR STENOSIS    RUE Venous US 06/22/13: IMPRESSION:       Areas of nonocclusive thrombus within the basilic and  cephalic veins.  2. The remaining interrogated venous structures demonstrate no evidence of  thrombus.    Past Medical History:  Diagnosis Date   Alcohol abuse 04/29/2019   Allergy    Anxiety    ARDS (adult respiratory distress syndrome) (HCC)    Bipolar disorder (HCC)    Cataract    Chronic kidney disease    per pt - no current problems   COPD (chronic obstructive pulmonary disease) (HCC)    chronic cough and wheezing   Depression    Diabetes mellitus without complication ([place])    type 2  Dizziness    medication related   Dyspnea    with exertion   Elevated lipids    Fatty liver    Hypercholesterolemia    Hypertension    per pt, no current prob-no meds   Pancreatitis    Pneumonia    in past   Pulmonary embolism (HCC)    Schizoaffective disorder [facility])     Past Surgical History:  Procedure Laterality Date   CATARACT EXTRACTION W/PHACO Right 03/26/2018   Procedure: CATARACT EXTRACTION PHACO AND INTRAOCULAR LENS PLACEMENT ([place]) RIGHT BORDERLINE DIABETIC;  Surgeon: Leandrew Koyanagi, MD;  Location: [place];  Service: Ophthalmology;  Laterality: Right;   CATARACT EXTRACTION W/PHACO Left 04/23/2018   Procedure: CATARACT EXTRACTION  PHACO AND INTRAOCULAR LENS PLACEMENT ([place])  LEFT BORDERLINE DIABETIC;  Surgeon: Leandrew Koyanagi, MD;  Location: [place];  Service: Ophthalmology;  Laterality: Left;   COLONOSCOPY     COLONOSCOPY WITH PROPOFOL N/A 08/21/2017   Procedure: COLONOSCOPY WITH PROPOFOL;  Surgeon: Manya Silvas, MD;  Location: [facility] ENDOSCOPY;  Service: Endoscopy;  Laterality: N/A;   EYE SURGERY     HERNIA REPAIR     inguinal and umbilical   RIB RESECTION N/A 08/20/2019   Procedure: removal of xyphoid process;  Surgeon: Nestor Lewandowsky, MD;  Location: [facility] ORS;  Service: Thoracic;  Laterality: N/A;   UMBILICAL HERNIA REPAIR N/A 06/22/2019   Procedure: OPEN HERNIA REPAIR UMBILICAL ADULT;  Surgeon: Olean Ree, MD;  Location: [facility] ORS;  Service: General;  Laterality: N/A;   UPPER GI ENDOSCOPY      MEDICATIONS: No current facility-administered medications for this encounter.    albuterol (PROVENTIL) (2.5 MG/3ML) 0.083% nebulizer solution   albuterol (VENTOLIN HFA) 108 (90 Base) MCG/ACT inhaler   ALPRAZolam (XANAX) 0.5 MG tablet   Azelastine-Fluticasone 137-50 MCG/ACT SUSP   budesonide-formoterol (SYMBICORT) 80-4.5 MCG/ACT inhaler   carboxymethylcellulose (REFRESH PLUS) 0.5 % SOLN   dextromethorphan-guaiFENesin (MUCINEX DM) 30-600 MG 12hr tablet   Dulaglutide (TRULICITY) 3 YY/5.0PT SOPN   INVOKANA 300 MG TABS tablet   meloxicam (MOBIC) 15 MG tablet   OLANZapine (ZYPREXA) 20 MG tablet   sertraline (ZOLOFT) 100 MG tablet   simvastatin (ZOCOR) 80 MG tablet   glucose blood test strip   insulin glargine (LANTUS SOLOSTAR) 100 UNIT/ML Solostar Pen   meclizine (ANTIVERT) 25 MG tablet   ondansetron (ZOFRAN-ODT) 8 MG disintegrating tablet   OneTouch Delica Lancets 46F MISC   oxyCODONE (OXY IR/ROXICODONE) 5 MG immediate release tablet   ULTRACARE PEN NEEDLES 32G X 4 MM MISC    Myra Gianotti, PA-C Surgical Short Stay/Anesthesiology [facility] Phone [phone] [facility] Phone [phone] 08/30/2021  3:44 PM

## 2021-08-31 ENCOUNTER — Ambulatory Visit (HOSPITAL_COMMUNITY): Payer: Medicare Other | Admitting: Vascular Surgery

## 2021-08-31 ENCOUNTER — Telehealth: Payer: Self-pay | Admitting: Gastroenterology

## 2021-08-31 ENCOUNTER — Ambulatory Visit (HOSPITAL_COMMUNITY)
Admission: RE | Admit: 2021-08-31 | Discharge: 2021-08-31 | Disposition: A | Discharge status: Home / Self Care | Payer: Medicare Other | Source: Ambulatory Visit | Attending: Gastroenterology | Admitting: Gastroenterology

## 2021-08-31 ENCOUNTER — Encounter (HOSPITAL_COMMUNITY)
Admission: RE | Disposition: A | Discharge status: Home / Self Care | Payer: Self-pay | Source: Ambulatory Visit | Attending: Gastroenterology

## 2021-08-31 ENCOUNTER — Encounter (HOSPITAL_COMMUNITY): Payer: Self-pay | Admitting: Gastroenterology

## 2021-08-31 ENCOUNTER — Other Ambulatory Visit: Payer: Self-pay

## 2021-08-31 ENCOUNTER — Ambulatory Visit (HOSPITAL_COMMUNITY): Payer: Medicare Other

## 2021-08-31 DIAGNOSIS — K209 Esophagitis, unspecified without bleeding: Secondary | ICD-10-CM | POA: Insufficient documentation

## 2021-08-31 DIAGNOSIS — K87 Disorders of gallbladder, biliary tract and pancreas in diseases classified elsewhere: Secondary | ICD-10-CM | POA: Diagnosis not present

## 2021-08-31 DIAGNOSIS — I471 Supraventricular tachycardia: Secondary | ICD-10-CM | POA: Insufficient documentation

## 2021-08-31 DIAGNOSIS — Z794 Long term (current) use of insulin: Secondary | ICD-10-CM | POA: Diagnosis not present

## 2021-08-31 DIAGNOSIS — Z87891 Personal history of nicotine dependence: Secondary | ICD-10-CM | POA: Insufficient documentation

## 2021-08-31 DIAGNOSIS — K861 Other chronic pancreatitis: Secondary | ICD-10-CM | POA: Diagnosis not present

## 2021-08-31 DIAGNOSIS — K3189 Other diseases of stomach and duodenum: Secondary | ICD-10-CM | POA: Diagnosis not present

## 2021-08-31 DIAGNOSIS — Z7985 Long-term (current) use of injectable non-insulin antidiabetic drugs: Secondary | ICD-10-CM | POA: Diagnosis not present

## 2021-08-31 DIAGNOSIS — B9681 Helicobacter pylori [H. pylori] as the cause of diseases classified elsewhere: Secondary | ICD-10-CM | POA: Diagnosis not present

## 2021-08-31 DIAGNOSIS — K860 Alcohol-induced chronic pancreatitis: Secondary | ICD-10-CM | POA: Insufficient documentation

## 2021-08-31 DIAGNOSIS — Z419 Encounter for procedure for purposes other than remedying health state, unspecified: Secondary | ICD-10-CM

## 2021-08-31 DIAGNOSIS — J449 Chronic obstructive pulmonary disease, unspecified: Secondary | ICD-10-CM | POA: Diagnosis not present

## 2021-08-31 DIAGNOSIS — R935 Abnormal findings on diagnostic imaging of other abdominal regions, including retroperitoneum: Secondary | ICD-10-CM

## 2021-08-31 DIAGNOSIS — I10 Essential (primary) hypertension: Secondary | ICD-10-CM | POA: Diagnosis not present

## 2021-08-31 DIAGNOSIS — K2970 Gastritis, unspecified, without bleeding: Secondary | ICD-10-CM | POA: Insufficient documentation

## 2021-08-31 DIAGNOSIS — Z86711 Personal history of pulmonary embolism: Secondary | ICD-10-CM | POA: Insufficient documentation

## 2021-08-31 DIAGNOSIS — K760 Fatty (change of) liver, not elsewhere classified: Secondary | ICD-10-CM | POA: Insufficient documentation

## 2021-08-31 DIAGNOSIS — Z9889 Other specified postprocedural states: Secondary | ICD-10-CM | POA: Diagnosis not present

## 2021-08-31 DIAGNOSIS — K8689 Other specified diseases of pancreas: Secondary | ICD-10-CM | POA: Diagnosis not present

## 2021-08-31 DIAGNOSIS — E785 Hyperlipidemia, unspecified: Secondary | ICD-10-CM | POA: Diagnosis not present

## 2021-08-31 DIAGNOSIS — E119 Type 2 diabetes mellitus without complications: Secondary | ICD-10-CM | POA: Insufficient documentation

## 2021-08-31 DIAGNOSIS — F259 Schizoaffective disorder, unspecified: Secondary | ICD-10-CM | POA: Insufficient documentation

## 2021-08-31 DIAGNOSIS — K863 Pseudocyst of pancreas: Secondary | ICD-10-CM | POA: Diagnosis not present

## 2021-08-31 DIAGNOSIS — K862 Cyst of pancreas: Secondary | ICD-10-CM | POA: Insufficient documentation

## 2021-08-31 DIAGNOSIS — F319 Bipolar disorder, unspecified: Secondary | ICD-10-CM | POA: Insufficient documentation

## 2021-08-31 DIAGNOSIS — K852 Alcohol induced acute pancreatitis without necrosis or infection: Secondary | ICD-10-CM

## 2021-08-31 DIAGNOSIS — E7800 Pure hypercholesterolemia, unspecified: Secondary | ICD-10-CM | POA: Diagnosis not present

## 2021-08-31 DIAGNOSIS — Z7984 Long term (current) use of oral hypoglycemic drugs: Secondary | ICD-10-CM | POA: Diagnosis not present

## 2021-08-31 DIAGNOSIS — K838 Other specified diseases of biliary tract: Secondary | ICD-10-CM | POA: Insufficient documentation

## 2021-08-31 HISTORY — PX: SPHINCTEROTOMY: SHX5279

## 2021-08-31 HISTORY — PX: BIOPSY: SHX5522

## 2021-08-31 HISTORY — PX: ENDOSCOPIC RETROGRADE CHOLANGIOPANCREATOGRAPHY (ERCP) WITH PROPOFOL: SHX5810

## 2021-08-31 HISTORY — PX: FINE NEEDLE ASPIRATION: SHX5430

## 2021-08-31 HISTORY — PX: UPPER ESOPHAGEAL ENDOSCOPIC ULTRASOUND (EUS): SHX6562

## 2021-08-31 HISTORY — PX: ESOPHAGOGASTRODUODENOSCOPY (EGD) WITH PROPOFOL: SHX5813

## 2021-08-31 LAB — GLUCOSE, CAPILLARY
Glucose-Capillary: 248 mg/dL — ABNORMAL HIGH (ref 70–99)
Glucose-Capillary: 319 mg/dL — ABNORMAL HIGH (ref 70–99)

## 2021-08-31 SURGERY — ENDOSCOPIC RETROGRADE CHOLANGIOPANCREATOGRAPHY (ERCP) WITH PROPOFOL
Anesthesia: General

## 2021-08-31 MED ORDER — INSULIN ASPART 100 UNIT/ML IJ SOLN
INTRAMUSCULAR | Status: AC
  Filled 2021-08-31: qty 1

## 2021-08-31 MED ORDER — CIPROFLOXACIN HCL 500 MG PO TABS: 500.0000 mg | ORAL_TABLET | Freq: Two times a day (BID) | ORAL | 0 refills | Status: AC

## 2021-08-31 MED ORDER — CIPROFLOXACIN IN D5W 400 MG/200ML IV SOLN
INTRAVENOUS | Status: AC
  Filled 2021-08-31: qty 200

## 2021-08-31 MED ORDER — SODIUM CHLORIDE 0.9 % IV SOLN: INTRAVENOUS | Status: DC | PRN

## 2021-08-31 MED ORDER — SODIUM CHLORIDE 0.9 % IV SOLN: INTRAVENOUS | Status: DC

## 2021-08-31 MED ORDER — FENTANYL CITRATE (PF) 100 MCG/2ML IJ SOLN
INTRAMUSCULAR | Status: AC
  Filled 2021-08-31: qty 2

## 2021-08-31 MED ORDER — INDOMETHACIN 50 MG RE SUPP
RECTAL | Status: DC | PRN
  Administered 2021-08-31: 100 mg via RECTAL

## 2021-08-31 MED ORDER — PROPOFOL 10 MG/ML IV BOLUS
INTRAVENOUS | Status: DC | PRN
  Administered 2021-08-31: 160 mg via INTRAVENOUS

## 2021-08-31 MED ORDER — OMEPRAZOLE 40 MG PO CPDR: 40.0000 mg | DELAYED_RELEASE_CAPSULE | Freq: Every day | ORAL | 6 refills | Status: DC

## 2021-08-31 MED ORDER — INSULIN ASPART 100 UNIT/ML IJ SOLN
8.0000 [IU] | Freq: Once | INTRAMUSCULAR | Status: AC
  Administered 2021-08-31: 8 [IU] via SUBCUTANEOUS
  Filled 2021-08-31: qty 0.08

## 2021-08-31 MED ORDER — ROCURONIUM BROMIDE 10 MG/ML (PF) SYRINGE
PREFILLED_SYRINGE | INTRAVENOUS | Status: DC | PRN
  Administered 2021-08-31: 50 mg via INTRAVENOUS
  Administered 2021-08-31: 30 mg via INTRAVENOUS

## 2021-08-31 MED ORDER — CIPROFLOXACIN IN D5W 400 MG/200ML IV SOLN
INTRAVENOUS | Status: DC | PRN
  Administered 2021-08-31: 400 mg via INTRAVENOUS

## 2021-08-31 MED ORDER — SUGAMMADEX SODIUM 200 MG/2ML IV SOLN
INTRAVENOUS | Status: DC | PRN
  Administered 2021-08-31: 200 mg via INTRAVENOUS

## 2021-08-31 MED ORDER — ONDANSETRON HCL 4 MG/2ML IJ SOLN
INTRAMUSCULAR | Status: DC | PRN
  Administered 2021-08-31: 4 mg via INTRAVENOUS

## 2021-08-31 MED ORDER — GLUCAGON HCL RDNA (DIAGNOSTIC) 1 MG IJ SOLR
INTRAMUSCULAR | Status: DC | PRN
  Administered 2021-08-31 (×4): .25 mg via INTRAVENOUS

## 2021-08-31 MED ORDER — LIDOCAINE 2% (20 MG/ML) 5 ML SYRINGE
INTRAMUSCULAR | Status: DC | PRN
  Administered 2021-08-31: 100 mg via INTRAVENOUS

## 2021-08-31 MED ORDER — LACTATED RINGERS IV SOLN: INTRAVENOUS | Status: DC | PRN

## 2021-08-31 MED ORDER — FENTANYL CITRATE (PF) 250 MCG/5ML IJ SOLN
INTRAMUSCULAR | Status: DC | PRN
  Administered 2021-08-31: 25 ug via INTRAVENOUS

## 2021-08-31 MED ORDER — GLUCAGON HCL RDNA (DIAGNOSTIC) 1 MG IJ SOLR
INTRAMUSCULAR | Status: AC
  Filled 2021-08-31: qty 1

## 2021-08-31 MED ORDER — DEXAMETHASONE SODIUM PHOSPHATE 10 MG/ML IJ SOLN
INTRAMUSCULAR | Status: DC | PRN
  Administered 2021-08-31: 10 mg via INTRAVENOUS

## 2021-08-31 MED ORDER — OXYCODONE HCL 5 MG PO TABS: 5.0000 mg | ORAL_TABLET | Freq: Four times a day (QID) | ORAL | 0 refills | Status: DC | PRN

## 2021-08-31 MED ORDER — INDOMETHACIN 50 MG RE SUPP
RECTAL | Status: AC
  Filled 2021-08-31: qty 2

## 2021-08-31 NOTE — H&P (Signed)
GASTROENTEROLOGY PROCEDURE H&P NOTE   Primary Care Physician: Gwyneth Sprout, FNP  HPI: Shaun Odonnell is a 55 y.o. male who presents for EGD/EUS with ERCP for evaluation of chronic pancreatitis, pancreatic duct stone, attempt at PD stone removal versus stenting.  Patient has decreased alcohol consumption significantly but is still drinking on occasion.  He has stopped tobacco use but is vaping.  Last episode of severe abdominal pain 1 week ago.  Weight has been stable.  Past Medical History:  Diagnosis Date   Alcohol abuse 04/29/2019   Allergy    Anxiety    ARDS (adult respiratory distress syndrome) (HCC)    Bipolar disorder (HCC)    Cataract    Chronic kidney disease    per pt - no current problems   COPD (chronic obstructive pulmonary disease) (HCC)    chronic cough and wheezing   Depression    Diabetes mellitus without complication ([place])    type 2   Dizziness    medication related   Dyspnea    with exertion   Elevated lipids    Fatty liver    Hypercholesterolemia    Hypertension    per pt, no current prob-no meds   Pancreatitis    Pneumonia    in past   Pulmonary embolism ([place])    Schizoaffective disorder ([place])    Past Surgical History:  Procedure Laterality Date   CATARACT EXTRACTION W/PHACO Right 03/26/2018   Procedure: CATARACT EXTRACTION PHACO AND INTRAOCULAR LENS PLACEMENT ([place]) RIGHT BORDERLINE DIABETIC;  Surgeon: Leandrew Koyanagi, MD;  Location: [place];  Service: Ophthalmology;  Laterality: Right;   CATARACT EXTRACTION W/PHACO Left 04/23/2018   Procedure: CATARACT EXTRACTION PHACO AND INTRAOCULAR LENS PLACEMENT ([place])  LEFT BORDERLINE DIABETIC;  Surgeon: Leandrew Koyanagi, MD;  Location: [place];  Service: Ophthalmology;  Laterality: Left;   COLONOSCOPY     COLONOSCOPY WITH PROPOFOL N/A 08/21/2017   Procedure: COLONOSCOPY WITH PROPOFOL;  Surgeon: Manya Silvas, MD;  Location: [facility] ENDOSCOPY;  Service: Endoscopy;   Laterality: N/A;   EYE SURGERY     HERNIA REPAIR     inguinal and umbilical   RIB RESECTION N/A 08/20/2019   Procedure: removal of xyphoid process;  Surgeon: Nestor Lewandowsky, MD;  Location: [facility];  Service: Thoracic;  Laterality: N/A;   UMBILICAL HERNIA REPAIR N/A 06/22/2019   Procedure: OPEN HERNIA REPAIR UMBILICAL ADULT;  Surgeon: Olean Ree, MD;  Location: [facility];  Service: General;  Laterality: N/A;   UPPER GI ENDOSCOPY     Current Facility-Administered Medications  Medication Dose Route Frequency Provider Last Rate Last Admin   0.9 %  sodium chloride infusion   Intravenous Continuous Mansouraty, Telford Nab., MD        Current Facility-Administered Medications:    0.9 %  sodium chloride infusion, , Intravenous, Continuous, Mansouraty, Telford Nab., MD Allergies  Allergen Reactions   Fentanyl Nausea And Vomiting   Morphine And Related Shortness Of Breath   Clonazepam Other (See Comments)    Dystonic Reaction   Other reaction(s): Other (See Comments) "Dystonic"; alprazolam is a home med "Dystonic"; alprazolam is a home med Dystonic Reaction   Pimozide Other (See Comments)    Dystonic Reaction  Other reaction(s): Other (See Comments) "Distonic" "Distonic" Dystonic Reaction   Trifluoperazine Other (See Comments)    Dystonic Reaction Other reaction(s): Other (See Comments) "Distonic" "Distonic" Dystonic reaction Dystonic Reaction   Azithromycin Other (See Comments)    Was told not to take Z pak due  to his heart   Montelukast     headache, nausea, feeling a little anxious, panic   Morphine Other (See Comments)    Other reaction(s): Other (See Comments) " I stop breathing"; can take Diluadid " I stop breathing"; can take Diluadid Cnnot tolerate d/t Drug Metabolism   Tramadol Nausea Only   Family History  Problem Relation Age of Onset   Hyperlipidemia Mother    Hypertension Father    Diabetes Maternal Aunt    Dementia Maternal Grandmother    Heart disease  Maternal Grandfather    Heart disease Paternal Grandmother    Stroke Paternal Grandfather    Diabetes Paternal Grandfather    Colon cancer Neg Hx    Esophageal cancer Neg Hx    Inflammatory bowel disease Neg Hx    Liver disease Neg Hx    Pancreatic cancer Neg Hx    Stomach cancer Neg Hx    Rectal cancer Neg Hx    Social History   Socioeconomic History   Marital status: Single    Spouse name: Not on file   Number of children: 0   Years of education: Not on file   Highest education level: Bachelor's degree (e.g., BA, AB, BS)  Occupational History   Occupation: disabled  Tobacco Use   Smoking status: Every Day    Packs/day: 1.50    Years: 34.00    Pack years: 51.00    Types: Cigarettes   Smokeless tobacco: Former    Types: Snuff    Quit date: 1990  Vaping Use   Vaping Use: Every day   Substances: Nicotine, Flavoring  Substance and Sexual Activity   Alcohol use: Yes    Alcohol/week: 6.0 standard drinks    Types: 6 Cans of beer per week    Comment: 3 beers 2x a week   Drug use: Not Currently   Sexual activity: Not Currently  Other Topics Concern   Not on file  Social History Narrative   Not on file   Social Determinants of Health   Financial Resource Strain: Low Risk    Difficulty of Paying Living Expenses: Not hard at all  Food Insecurity: No Food Insecurity   Worried About Charity fundraiser in the Last Year: Never true   Ran Out of Food in the Last Year: Never true  Transportation Needs: No Transportation Needs   Lack of Transportation (Medical): No   Lack of Transportation (Non-Medical): No  Physical Activity: Inactive   Days of Exercise per Week: 0 days   Minutes of Exercise per Session: 0 min  Stress: No Stress Concern Present   Feeling of Stress : Not at all  Social Connections: Socially Isolated   Frequency of Communication with Friends and Family: More than three times a week   Frequency of Social Gatherings with Friends and Family: Once a week    Attends Religious Services: Never   Marine scientist or Organizations: No   Attends Music therapist: Never   Marital Status: Never married  Human resources officer Violence: Not At Risk   Fear of Current or Ex-Partner: No   Emotionally Abused: No   Physically Abused: No   Sexually Abused: No    Physical Exam: Today's Vitals   08/30/21 1033 08/31/21 0847  BP:  126/72  Pulse:  66  Resp:  16  Temp:  97.8 F (36.6 C)  TempSrc:  Temporal  SpO2:  98%  Weight: 80.7 kg 80.7 kg  Height: 5\' 8"  (  1.727 m) 5\' 8"  (1.727 m)  PainSc:  1    Body mass index is 27.05 kg/m. GEN: NAD EYE: Sclerae anicteric ENT: MMM CV: Non-tachycardic GI: Soft, NT/ND NEURO:  Alert & Oriented x 3  Lab Results: No results for input(s): WBC, HGB, HCT, PLT in the last 72 hours. BMET No results for input(s): NA, K, CL, CO2, GLUCOSE, BUN, CREATININE, CALCIUM in the last 72 hours. LFT No results for input(s): PROT, ALBUMIN, AST, ALT, ALKPHOS, BILITOT, BILIDIR, IBILI in the last 72 hours. PT/INR No results for input(s): LABPROT, INR in the last 72 hours.   Impression / Plan: This is a 55 y.o.male who presents for EGD/EUS with ERCP for evaluation of chronic pancreatitis, pancreatic duct stone, attempt at PD stone removal versus stenting.  Patient has decreased alcohol consumption significantly but is still drinking on occasion.  He has stopped tobacco use but is vaping.  Last episode of severe abdominal pain 1 week ago.  Weight has been stable.  The risks of an EUS including intestinal perforation, bleeding, infection, aspiration, and medication effects were discussed as was the possibility it may not give a definitive diagnosis if a biopsy is performed.  When a biopsy of the pancreas is done as part of the EUS, there is an additional risk of pancreatitis at the rate of about 1-2%.  It was explained that procedure related pancreatitis is typically mild, although it can be severe and even life  threatening, which is why we do not perform random pancreatic biopsies and only biopsy a lesion/area we feel is concerning enough to warrant the risk.  The risks of an ERCP were discussed at length, including but not limited to the risk of perforation, bleeding, abdominal pain, post-ERCP pancreatitis (while usually mild can be severe and even life threatening).  The risks and benefits of endoscopic evaluation/treatment were discussed with the patient and/or family; these include but are not limited to the risk of perforation, infection, bleeding, missed lesions, lack of diagnosis, severe illness requiring hospitalization, as well as anesthesia and sedation related illnesses.  The patient's history has been reviewed, patient examined, no change in status, and deemed stable for procedure.  The patient and/or family is agreeable to proceed.    Justice Britain, MD [facility] Advanced Endoscopy Office # [phone]

## 2021-08-31 NOTE — Op Note (Signed)
[facility] Patient Name: Shaun Odonnell Procedure Date : 08/31/2021 MRN: [id number] Attending MD: Justice Britain , MD Date of Birth: Oct 29, 1966 CSN: [id number] Age: 55 Admit Type: Outpatient Procedure:                ERCP Indications:              Pancreatic duct stone, Pancreatic duct stricture,                            Chronic recurrent pancreatitis, Ethanol induced                            chronic recurrent pancreatitis Providers:                Justice Britain, MD, Doristine Johns, RN, Cletis Athens, Technician, Lodema Hong, Technician,                            Vickii Penna, CRNA Referring MD:             [name] MD, MD, Dr Rollene Rotunda, MD Medicines:                General Anesthesia, Indomethacin 100 mg PR,                            Glucagon 1 mg IV, Antibiotics had been administered                            during EUS Complications:            No immediate complications. Estimated Blood Loss:     Estimated blood loss was minimal. Procedure:                Pre-Anesthesia Assessment:                           - Prior to the procedure, a History and Physical                            was performed, and patient medications and                            allergies were reviewed. The patient's tolerance of                            previous anesthesia was also reviewed. The risks                            and benefits of the procedure and the sedation                            options and risks were discussed with the patient.  All questions were answered, and informed consent                            was obtained. Prior Anticoagulants: The patient has                            taken no previous anticoagulant or antiplatelet                            agents. ASA Grade Assessment: III - A patient with                            severe systemic disease. After reviewing the risks                             and benefits, the patient was deemed in                            satisfactory condition to undergo the procedure.                           After obtaining informed consent, the scope was                            passed under direct vision. Throughout the                            procedure, the patient's blood pressure, pulse, and                            oxygen saturations were monitored continuously. The                            TJF-Q180V ([id number]) Olympus duodenoscope was                            introduced through the mouth, and used to inject                            contrast into and to cannulate the ventral                            pancreatic duct. The ERCP was unusually difficult                            due to difficulty passing guidewires through                            pancreatic ductal stenosis. Successful completion                            of the procedure was aided by performing the  maneuvers documented (below) in this report. The                            patient tolerated the procedure. Scope In: Scope Out: Findings:      The scout film was normal.      The esophagus was successfully intubated under direct vision without       detailed examination of the pharynx, larynx, and associated structures,       and upper GI tract. The major papilla was congested and bulging as       previously discussed on EUS report.      A short 0.035 inch Soft Jagwire was passed into the ventral pancreatic       duct on first attempt. The ventral pancreatic duct was only       superficially cannulated as the wire did not want to pass deeply into       the pancreatic duct of the body. I superficially cannulated with the       Hydratome sphincterotome. Contrast was injected. I personally       interpreted the pancreatic duct images. The flow of contrast through the       ducts was poor. Image quality was suboptimal. Contrast extended  to the       pancreatic duct but opacification of the ventral pancreatic duct in the       head of the pancreas and pancreatic duct in the genu of the pancreas was       successful. The ventral pancreatic duct in the head of the pancreas and       pancreatic duct in the genu of the pancreas were dilated moderately at       approximately 6 mm in size (correlates with EUS as well). The ventral       pancreatic duct in the head of the pancreas contained a filling defect       that is likely the noted stone on the EUS. A 4 mm ventral pancreatic       sphincterotomy was made with a monofilament Hydratome sphincterotome       using ERBE electrocautery. There was no post-sphincterotomy bleeding.       When I tried to proceed with a double wire technique to see if I could       allow another wire to pass, this led to loss of the initial wire that       was winding towards the accessory duct. Trials with a short 0.025 inch       Angled Revolution Jagwire and a short 0.025 inch Revolution Straight       Jagwire as well as another short 0.035 inch Soft Jagwire were only able       to pass superficially into the ventral head. Going into long and short       and semi-long positions did not allow for further passage of the wires       to allow for deep PD cannulation. After more than an hour of attempts,       we decided to end the procedure.      The duodenoscope was withdrawn from the patient. Impression:               - The major papilla appeared congested and bulging.                           -  Difficult cannulation as noted above never led to                            deep seeding of the wire into the pancreas.                           - Moderate dilatation of the ventral pancreatic                            duct in the head/genu of the pancreas as a result                            of the noted single pancreatic stone.                           - A pancreatic sphincterotomy was performed but                             this did not improve success at deep cannulation.                           - Procedure completed unsuccessfully. Recommendation:           - The patient will be observed post-procedure,                            until all discharge criteria are met.                           - Discharge patient to home.                           - Patient has a contact number available for                            emergencies. The signs and symptoms of potential                            delayed complications were discussed with the                            patient. Return to normal activities tomorrow.                            Written discharge instructions were provided to the                            patient.                           - Low fat diet.                           - Recommend that we begin patient on Pancreatic  Enzyme Replacement Therapy (Creon 72K with each                            meal and 36K with each snack). We will work on                            trying to get the patient assistance if it is too                            expensive.                           - Recommend continued alcohol abstinence (he has                            done quite well with things over the last 5-6 weeks                            since our clinic visit with minimal severe episodes                            and none that have required hospitalization).                           - Continue Tobacco cessation.                           - Follow up in clinic and repeat CT-Abdomen                            Pancreas protocol in 6-weeks. This will evaluate                            the previous cyst (likely chronic pseudocyst) to                            see if it has recurred and also how the pancreatic                            duct is looking in regards to further dilation.                           - Patient after optimization and  PERT therapy being                            on board, if still having issues and/or episodes of                            severe pain, may benefit from a quaternary care                            evaluation for another attempt at ERCP and stone  extraction, vs if the pancreatic duct continues to                            dilate the role of [place] surgically. Hopefully he                            will just be doing better.                           - The findings and recommendations were discussed                            with the patient.                           - The findings and recommendations were discussed                            with the patient's family. Procedure Code(s):        --- Professional ---                           [phone], Endoscopic retrograde                            cholangiopancreatography (ERCP); with                            sphincterotomy/papillotomy                           [phone], Endoscopic catheterization of the pancreatic                            ductal system, radiological supervision and                            interpretation Diagnosis Code(s):        --- Professional ---                           K86.89, Other specified diseases of pancreas                           K86.1, Other chronic pancreatitis                           K86.0, Alcohol-induced chronic pancreatitis                           K83.8, Other specified diseases of biliary tract CPT copyright 2019 American Medical Association. All rights reserved. The codes documented in this report are preliminary and upon coder review may  be revised to meet current compliance requirements. Justice Britain, MD 08/31/2021 1:12:17 PM Number of Addenda: 0

## 2021-08-31 NOTE — Op Note (Signed)
[facility] Patient Name: Shaun Odonnell Procedure Date : 08/31/2021 MRN: [id number] Attending MD: Justice Britain , MD Date of Birth: 1966/03/11 CSN: [id number] Age: 55 Admit Type: Outpatient Procedure:                Upper EUS Indications:              Pancreatic cyst on MRCP, Abnormal MRCP, Dilated                            pancreatic duct on MRI, Chronic pancreatitis,                            Chronic recurrent pancreatitis, Generalized                            abdominal pain Providers:                Justice Britain, MD, Doristine Johns, RN, Cletis Athens, Technician, Lodema Hong, Technician,                            Vickii Penna, CRNA Referring MD:             [name] MD, MD, Dr Rollene Rotunda, MD Medicines:                General Anesthesia, Cipro 644 mg IV Complications:            No immediate complications. Estimated Blood Loss:     Estimated blood loss was minimal. Procedure:                Pre-Anesthesia Assessment:                           - Prior to the procedure, a History and Physical                            was performed, and patient medications and                            allergies were reviewed. The patient's tolerance of                            previous anesthesia was also reviewed. The risks                            and benefits of the procedure and the sedation                            options and risks were discussed with the patient.                            All questions were answered, and informed consent  was obtained. Prior Anticoagulants: The patient has                            taken no previous anticoagulant or antiplatelet                            agents. ASA Grade Assessment: III - A patient with                            severe systemic disease. After reviewing the risks                            and benefits, the patient was deemed in                             satisfactory condition to undergo the procedure.                           After obtaining informed consent, the endoscope was                            passed under direct vision. Throughout the                            procedure, the patient's blood pressure, pulse, and                            oxygen saturations were monitored continuously. The                            [id number]-H190 ([id number]) Olympus endoscope was introduced                            through the mouth, and advanced to the second part                            of duodenum. The [id number]-Q180V ([id number]) Olympus                            duodenoscope was introduced through the mouth, and                            advanced to the second part of duodenum. The                            [id number]-[id number] ([id number]) Olympus linear ultrasound scope                            was introduced through the mouth, and advanced to                            the duodenum for ultrasound examination from the  stomach and duodenum. The upper EUS was                            accomplished without difficulty. The patient                            tolerated the procedure. Scope In: Scope Out: Findings:      ENDOSCOPIC FINDING: :      No gross lesions were noted in the proximal esophagus and in the mid       esophagus.      LA Grade C (one or more mucosal breaks continuous between tops of 2 or       more mucosal folds, less than 75% circumference) esophagitis with no       bleeding was found in the distal esophagus.      The Z-line was regular and was found 39 cm from the incisors.      Patchy mild inflammation characterized by erythema was found in the       entire examined stomach. Biopsies were taken with a cold forceps for       histology and Helicobacter pylori testing.      No gross lesions were noted in the duodenal bulb, in the first portion       of the duodenum and in the second portion of the  duodenum.      Moderately congested mucosa with bulging was found at the major papilla.      ENDOSONOGRAPHIC FINDING: :      A single hyperechoic foci measuring up to eight mm in diameter       suggestive of stones was found in the pancreatic head/genu region.      The pancreatic duct had a dilated endosonographic appearance and had a       prominently branched endosonographic appearance in the pancreatic head       and genu of the pancreas (due to aforementioned stone in the duct), body       of the pancreas and tail of the pancreas were prominent but not so       severely dilated.      Pancreatic parenchymal abnormalities were noted in the entire pancreas.       These consisted of hyperechoic foci with shadowing, lobularity without       honeycombing and hyperechoic strands.      An anechoic lesion suggestive of a cyst was identified in the pancreatic       tail. It is not in obvious communication with the pancreatic duct. The       lesion measured 40 mm by 30 mm in maximal cross-sectional diameter.       There was a single compartment without septae. The outer wall of the       lesion was thick. There was no associated mass. There was no internal       debris within the fluid-filled cavity. Diagnostic needle aspiration for       fluid was performed. Color Doppler imaging was utilized prior to needle       puncture to confirm a lack of significant vascular structures within the       needle path. One pass was made with the 22 gauge needle using a       transgastric approach. A stylet was used. The amount of fluid collected  was 25 mL and this was drained completely. The fluid was cloudy, yellow       and thin. Samples were sent for amylase concentration, cytology, CEA and       GNAS mutation analysis (if CEA is >192).      No malignant-appearing lymph nodes were visualized in the celiac region       (level 20), peripancreatic region and porta hepatis region.      Endosonographic  imaging in the visualized portion of the liver showed no       mass.      The celiac region was visualized. Impression:               EGD Impression:                           - No gross lesions in esophagus proximally. LA                            Grade C esophagitis with no bleeding distally.                           - Z-line regular, 39 cm from the incisors.                           - Gastritis. Biopsied.                           - No gross lesions in the duodenal bulb, in the                            first portion of the duodenum and in the second                            portion of the duodenum.                           - Congested/bulging ampulla noted.                           EUS Impression:                           - Findings suggestive of a pancreatic stone were                            identified in the pancreatic head/genu region.                           - The pancreatic duct had a dilated endosonographic                            appearance and had a prominently branched                            endosonographic appearance in the pancreatic head  and genu mostly (due to the stone noted) with mild                            prominence of the body and tail duct regions.                           - Pancreatic parenchymal abnormalities consisting                            of hyperechoic foci, lobularity and hyperechoic                            strands were noted in the entire pancreas.                            Consistent with stone in the PD with Chronic                            Pancreatitis.                           - A cystic lesion was seen in the pancreatic tail.                            Fluid was obtained from this exam, and results are                            pending. However, the endosonographic appearance is                            suggestive of a chronic pancreatic pseudocyst vs                            IPMN.  Fine needle aspiration for fluid performed.                           - No malignant-appearing lymph nodes were                            visualized in the celiac region (level 20),                            peripancreatic region and porta hepatis region. Recommendation:           - Proceed to scheduled ERCP for attempt at PD stone                            extraction v stenting.                           - Observe patient's clinical course.                           - Await cytology results, await path results and  await tumor markers.                           - Initiate Omeprazole 40 mg daily.                           - Recommend PERT initiation (see ERCP note).                           - Ciprofloxacin 500 mg twice daily for 3-days to                            decrease risk of post-interventional infectious                            complications.                           - The findings and recommendations were discussed                            with the patient.                           - The findings and recommendations were discussed                            with the patient's family. Procedure Code(s):        --- Professional ---                           [phone], Esophagogastroduodenoscopy, flexible,                            transoral; with transendoscopic ultrasound-guided                            intramural or transmural fine needle                            aspiration/biopsy(s), (includes endoscopic                            ultrasound examination limited to the esophagus,                            stomach or duodenum, and adjacent structures) Diagnosis Code(s):        --- Professional ---                           K20.90, Esophagitis, unspecified without bleeding                           K29.70, Gastritis, unspecified, without bleeding                           K31.89, Other diseases of stomach and duodenum  R93.3, Abnormal findings on diagnostic imaging of                            other parts of digestive tract                           K86.9, Disease of pancreas, unspecified                           K86.2, Cyst of pancreas                           I89.9, Noninfective disorder of lymphatic vessels                            and lymph nodes, unspecified                           K86.89, Other specified diseases of pancreas                           K86.1, Other chronic pancreatitis                           R10.84, Generalized abdominal pain                           R93.2, Abnormal findings on diagnostic imaging of                            liver and biliary tract CPT copyright 2019 [employer]. All rights reserved. The codes documented in this report are preliminary and upon coder review may  be revised to meet current compliance requirements. Justice Britain, MD 08/31/2021 1:29:49 PM Number of Addenda: 0

## 2021-08-31 NOTE — Anesthesia Postprocedure Evaluation (Signed)
Anesthesia Post Note  Patient: Shaun Odonnell  Procedure(s) Performed: ENDOSCOPIC RETROGRADE CHOLANGIOPANCREATOGRAPHY (ERCP) WITH PROPOFOL UPPER ESOPHAGEAL ENDOSCOPIC ULTRASOUND (EUS) FINE NEEDLE ASPIRATION (FNA) LINEAR BIOPSY SPHINCTEROTOMY     Patient location during evaluation: PACU Anesthesia Type: General Level of consciousness: awake Pain management: pain level controlled Vital Signs Assessment: post-procedure vital signs reviewed and stable Respiratory status: spontaneous breathing Cardiovascular status: stable Postop Assessment: no apparent nausea or vomiting Anesthetic complications: no   No notable events documented.  Last Vitals:  Vitals:   08/31/21 1326 08/31/21 1327  BP:  (!) 141/76  Pulse: 75 71  Resp: 13 14  Temp:    SpO2: 93% 93%    Last Pain:  Vitals:   08/31/21 1327  TempSrc:   PainSc: 0-No pain                 Angelena Sand

## 2021-08-31 NOTE — Telephone Encounter (Signed)
Per the ERCP report the pt will follow a low fat diet.  He will eat bland today and advance as tolerated. He will call back with any further questions.

## 2021-08-31 NOTE — Transfer of Care (Signed)
Immediate Anesthesia Transfer of Care Note  Patient: Shaun Odonnell  Procedure(s) Performed: ENDOSCOPIC RETROGRADE CHOLANGIOPANCREATOGRAPHY (ERCP) WITH PROPOFOL UPPER ESOPHAGEAL ENDOSCOPIC ULTRASOUND (EUS) FINE NEEDLE ASPIRATION (FNA) LINEAR BIOPSY SPHINCTEROTOMY  Patient Location: PACU  Anesthesia Type:General  Level of Consciousness: drowsy and patient cooperative  Airway & Oxygen Therapy: Patient Spontanous Breathing and Patient connected to face mask oxygen  Post-op Assessment: Report given to RN and Post -op Vital signs reviewed and stable  Post vital signs: Reviewed and stable  Last Vitals:  Vitals Value Taken Time  BP 154/94 08/31/21 1259  Temp    Pulse 99 08/31/21 1302  Resp 20 08/31/21 1302  SpO2 95 % 08/31/21 1302  Vitals shown include unvalidated device data.  Last Pain:  Vitals:   08/31/21 0847  TempSrc: Temporal  PainSc: 1          Complications: No notable events documented.

## 2021-08-31 NOTE — Telephone Encounter (Signed)
Patient had ERCP today.  Wants to know what his diet restrictions are and for how long.  Please call patient (today if you can) and advise.  Thank you.

## 2021-08-31 NOTE — Telephone Encounter (Signed)
Received page to on call. Patient with abdominal pain after EUS/ERCP today. Was discharged with pain medications, but he reports decreasing efficacy of prescribed PO pain medications. Reviewed notes from EUS/ERCP from earlier today. Patient calls to ask about getting direct admit to [facility] w/o being seen in ER. Explained that is not a service I can provide, but do agree with plan to go to ER for expedited evaluation and possible hospital admission for pain control if that is required. He would prefer to go to  due to proximity to his home. All questions answered to the best of my ability and appreciative of call back.

## 2021-08-31 NOTE — Anesthesia Procedure Notes (Signed)
Procedure Name: Intubation Date/Time: 08/31/2021 10:45 AM Performed by: Thelma Comp, CRNA Pre-anesthesia Checklist: Patient identified, Emergency Drugs available, Suction available and Patient being monitored Patient Re-evaluated:Patient Re-evaluated prior to induction Oxygen Delivery Method: Circle System Utilized Preoxygenation: Pre-oxygenation with 100% oxygen Induction Type: IV induction Ventilation: Mask ventilation without difficulty Laryngoscope Size: Mac and 4 Grade View: Grade I Tube type: Oral Tube size: 7.5 mm Number of attempts: 1 Airway Equipment and Method: Stylet Placement Confirmation: ETT inserted through vocal cords under direct vision, positive ETCO2 and breath sounds checked- equal and bilateral Secured at: 23 cm Tube secured with: Tape Dental Injury: Teeth and Oropharynx as per pre-operative assessment

## 2021-09-01 ENCOUNTER — Encounter: Payer: Self-pay | Admitting: Gastroenterology

## 2021-09-01 ENCOUNTER — Other Ambulatory Visit: Payer: Self-pay | Admitting: Family Medicine

## 2021-09-01 DIAGNOSIS — K0889 Other specified disorders of teeth and supporting structures: Secondary | ICD-10-CM

## 2021-09-01 LAB — CYTOLOGY - NON PAP

## 2021-09-01 LAB — SURGICAL PATHOLOGY

## 2021-09-01 NOTE — Telephone Encounter (Signed)
Thanks for update Patty. GM 

## 2021-09-01 NOTE — Telephone Encounter (Signed)
Left message on machine to call back  

## 2021-09-01 NOTE — Telephone Encounter (Signed)
The pt states he has some discomfort but is able to eat and drink and the pain meds are helping.  He is aware to go to the ED if his symptoms worsen over the weekend.  FYI Dr Rush Landmark

## 2021-09-01 NOTE — Telephone Encounter (Signed)
VC, Thank you for taking this call. It looks like he did not go to any hospital last night. Patty, please call the patient later this morning and check on seeing how he is doing.  If his pain is so significant that he cannot tolerate eating or drinking and is getting dehydrated recommend that he come to the hospital for further evaluation to see if he has postprocedural complications or pancreatitis. Thanks. GM

## 2021-09-01 NOTE — Telephone Encounter (Signed)
Patient is returning your call.  

## 2021-09-02 ENCOUNTER — Telehealth: Payer: Self-pay | Admitting: Gastroenterology

## 2021-09-02 NOTE — Telephone Encounter (Signed)
Patient called the on-call provider line again tonight.  He was again complaining of severe abdominal pain and requesting more pain medication. Specifically, the patient requested Dilaudid by name.  He reported to me he took Dilaudid after a surgery and it "worked well for him".  The patient was tolerating p.o. and denied any fevers or chills.  He had called the on-call provider 2 nights ago as well with severe pain and was advised to go to the emergency room.  However the patient did not go and when he was followed up by his primary gastroenterologist (Dr. Rush Landmark) he reported feeling better. Reviewing the chart, it seems the patient has a history of requesting Dilaudid (see clinic note September 21), and other pain medications (Oct 6th) even before his most recent procedure.  He has multiple encounters recently in the electronic medical record in which he is requesting refills of narcotics are increasing pain meds. I have very low suspicion for any complication from his procedure based on the fact that his symptoms seem to be very similar to his chronic pain.  I told the patient that if he really felt his pain was that much worse than his usual, he would need to go to the emergency room, but he stated he did not want to wait around in the waiting room for hours.  I told him I appreciated that going to the ER is time-consuming, but I do not feel comfortable prescribing him Dilaudid or even increasing his oxycodone dosage over the phone. I am a little concerned that the patient is "doctor shopping" and seeking other providers to prescribe him narcotics.  I told the patient that this is a chronic problem that is best addressed by a pain specialist. In the end, I told the patient that if he wanted more pain medication, he would need to be evaluated in the ER.

## 2021-09-04 ENCOUNTER — Encounter (HOSPITAL_COMMUNITY): Payer: Self-pay | Admitting: Gastroenterology

## 2021-09-04 NOTE — Telephone Encounter (Signed)
Patty, Thank you for the update. Certainly his prerogative to decide to stay rather than go but the longer he waits the more likelihood that if there is overt pancreatitis the higher risk he may have but he has been very well aware of this.  As well there was no guarantee that he was going to hurt but he had a higher chance of discomfort/pancreatitis in the setting of is having gone in and been unable to deeply cannulate the PD.  No additional pain medications will be administered through [place].  If he continues to have issues he needs to have laboratories and repeat imaging performed.  As I reviewed his chart further, he had continued to have alcohol consumption and had not been truthful with me in regards to the amount of alcohol he continued to drink.  If I had truly known this as well as the discussion with the patient's family after the fact, I may not have even attempted the PD stone extraction.  We will see how the patient does, see how he does with pancreas enzyme replacement therapy which we were working on trying to get him patient assistance and see where things stand at the end of the week.  Please check on him on Thursday.  Thanks.  GM

## 2021-09-04 NOTE — Telephone Encounter (Signed)
SEC, Thank you for the update. He had called and was feeling somewhat better so postprocedural pancreatitis was felt to be controlled with the pain medicine that he had been given. I reviewed the chart and he did not end up going to the hospital or ER at [facility] or [facility] that I can see. Patty, please reach out to the patient and see where things are in regards to his current clinical situation. If he is still having significant abdominal pain at this point he should have already gone to the hospital but let me know.  If he is still having pain but is eating and drinking then let him come in for a CMP and lipase/amylase and CBC today if he can or get it done at [facility]. Thanks. GM

## 2021-09-04 NOTE — Addendum Note (Signed)
Addendum  created 09/04/21 0954 by Jacinta Shoe, PA-C   Clinical Note Signed

## 2021-09-04 NOTE — Telephone Encounter (Signed)
I called and spoke with the pt and he tells me that he still has pain that causes him to take pain meds at least every 6 hours. He states he is only able to eat pudding and very soft foods.  He does not want to go to the ED but wants to wait another few days and see how he does.  He also states that " Dr Rush Landmark told me that I was going to hurt because the procedure failed and only agitated things".

## 2021-09-05 LAB — POCT I-STAT, CHEM 8
BUN: 9 mg/dL (ref 6–20)
Calcium, Ion: 1.15 mmol/L (ref 1.15–1.40)
Chloride: 101 mmol/L (ref 98–111)
Creatinine, Ser: 0.5 mg/dL — ABNORMAL LOW (ref 0.61–1.24)
Glucose, Bld: 189 mg/dL — ABNORMAL HIGH (ref 70–99)
HCT: 44 % (ref 39.0–52.0)
Hemoglobin: 15 g/dL (ref 13.0–17.0)
Potassium: 4.2 mmol/L (ref 3.5–5.1)
Sodium: 138 mmol/L (ref 135–145)
TCO2: 25 mmol/L (ref 22–32)

## 2021-09-05 MED ORDER — MELOXICAM 15 MG PO TABS: 15.0000 mg | ORAL_TABLET | Freq: Every day | ORAL | 0 refills | Status: DC

## 2021-09-07 ENCOUNTER — Ambulatory Visit: Payer: Medicare Other | Admitting: Pulmonary Disease

## 2021-09-08 ENCOUNTER — Other Ambulatory Visit: Payer: Self-pay

## 2021-09-08 MED ORDER — PANCRELIPASE (LIP-PROT-AMYL) 36000-114000 UNITS PO CPEP: ORAL_CAPSULE | ORAL | 11 refills | Status: DC

## 2021-09-08 NOTE — Progress Notes (Signed)
Rx for Creon has been sent to pharmacy.Patient assistance form has been sent to the Nurse [facility] as well as per Dr. Rush Landmark.

## 2021-09-12 NOTE — Telephone Encounter (Signed)
The pt called and states he continues to have pain and is having to take "pain meds" at least every 6-8 hours.  He says that it is prescribed to take every 12 hours but he has such pain that he needs to take it at about 8 hours.  He says the pain is not as severe as it has been but he does continue to have pain that is only relieved partly with narcotics.  He states he is going to the ED and wanted to know if he should ask to be admitted.  I advised him that if he chooses to go to the ED he will be evaluated by the ED MD and they will determine what treatment is needed.  He has been advised on several occasions to go to the ED for post procedure pain but has declined.  At this time he says he is on the last of his narcotics prescription and needs to have some pain control.  He also mentions again that Dr Rush Landmark advised him the procedure was "not successful" and he "irritated the organ" and he will be in pain that will need to be controlled.  FYI Dr Rush Landmark

## 2021-09-12 NOTE — Telephone Encounter (Signed)
Patient is calling to follow up on previous message and states he is still having pain and is also still taking the pain medication would like to know if he should still go to the ED like Dr. Rush Landmark suggested. Seeking advise.

## 2021-09-12 NOTE — Progress Notes (Deleted)
Established patient visit   Patient: Shaun Odonnell   DOB: 07/27/1966   55 y.o. Male  MRN: [id number] Visit Date: 09/14/2021  Today's healthcare provider: Lavon Paganini, MD   No chief complaint on file.  Subjective    HPI  Diabetes Mellitus Type II, Follow-up  Lab Results  Component Value Date   HGBA1C 11.2 (H) 06/06/2021   HGBA1C 8.2 (A) 12/13/2020   HGBA1C 8.3 (A) 09/12/2020   Wt Readings from Last 3 Encounters:  08/31/21 177 lb 14.6 oz (80.7 kg)  07/26/21 174 lb 11.2 oz (79.2 kg)  07/19/21 173 lb 4.8 oz (78.6 kg)   Last seen for diabetes 3 {days/wks/mos/yrs:310907} ago.  Management since then includes ***. He reports {excellent/good/fair/poor:19665} compliance with treatment. He {is/is not:21021397} having side effects. {document side effects if present:1} Symptoms: {Yes/No:20286} fatigue {Yes/No:20286} foot ulcerations  {Yes/No:20286} appetite changes {Yes/No:20286} nausea  {Yes/No:20286} paresthesia of the feet  {Yes/No:20286} polydipsia  {Yes/No:20286} polyuria {Yes/No:20286} visual disturbances   {Yes/No:20286} vomiting     Home blood sugar records: {diabetes glucometry results:16657}  Episodes of hypoglycemia? {Yes/No:20286} {enter symptoms and frequency of symptoms if yes:1}   Current insulin regiment: {enter 'none' or type of insulin and number of units taken with each dose of each insulin formulation that the patient is taking:1} Most Recent Eye Exam: *** {Current exercise:16438:::1} {Current diet habits:16563:::1}  Pertinent Labs: Lab Results  Component Value Date   CHOL 153 06/06/2021   HDL 37 (L) 06/06/2021   LDLCALC 85 06/06/2021   TRIG 189 (H) 06/29/2021   CHOLHDL 4.1 06/06/2021   Lab Results  Component Value Date   NA 138 08/31/2021   K 4.2 08/31/2021   CREATININE 0.50 (L) 08/31/2021   GFRNONAA >60 07/29/2021   MICROALBUR negative 05/03/2020   LABMICR 7.6 06/06/2021      ---------------------------------------------------------------------------------------------------   {Link to patient history deactivated due to formatting error:1}  Medications: Outpatient Medications Prior to Visit  Medication Sig   albuterol (PROVENTIL) (2.5 MG/3ML) 0.083% nebulizer solution Take 3 mLs (2.5 mg total) by nebulization every 6 (six) hours as needed for wheezing or shortness of breath.   albuterol (VENTOLIN HFA) 108 (90 Base) MCG/ACT inhaler INHALE 2 PUFFS BY MOUTH EVERY 6 HOURS ASNEEDED WHEEZING/ SHORTNESS OF BREATH   ALPRAZolam (XANAX) 0.5 MG tablet Take 0.5 mg by mouth 4 (four) times daily. Patient takes when wakes up(~0600)/ 1000/ 1600/ 2000   Azelastine-Fluticasone 137-50 MCG/ACT SUSP Place 1 spray into the nose 2 (two) times daily as needed (allergies).   budesonide-formoterol (SYMBICORT) 80-4.5 MCG/ACT inhaler Inhale 2 puffs into the lungs in the morning and at bedtime.   carboxymethylcellulose (REFRESH PLUS) 0.5 % SOLN Place 2 drops into both eyes daily as needed (Dry eyes).   dextromethorphan-guaiFENesin (MUCINEX DM) 30-600 MG 12hr tablet Take 1 tablet by mouth 2 (two) times daily as needed for cough.   Dulaglutide (TRULICITY) 3 PY/1.9JK SOPN Inject 3 mg into the skin once a week. (Patient taking differently: Inject 3 mg into the skin every Friday.)   glucose blood test strip Use as instructed   insulin glargine (LANTUS SOLOSTAR) 100 UNIT/ML Solostar Pen INJECT 10 UNITS SUBCUTANEOUSLY AT BEDTIME   INVOKANA 300 MG TABS tablet TAKE 1 TABLET BY MOUTH ONCE DAILY BEFOREBREAFKAST   lipase/protease/amylase (CREON) 36000 UNITS CPEP capsule Take 2 capsules (72,000 Units total) by mouth 3 (three) times daily with meals. May also take 1 capsule (36,000 Units total) as needed (with snacks).   meclizine (ANTIVERT) 25 MG  tablet Take 1 tablet (25 mg total) by mouth 3 (three) times daily as needed for dizziness.   meloxicam (MOBIC) 15 MG tablet Take 1 tablet (15 mg total) by mouth  daily.   OLANZapine (ZYPREXA) 20 MG tablet Take 20 mg by mouth at bedtime.   omeprazole (PRILOSEC) 40 MG capsule Take 1 capsule (40 mg total) by mouth daily.   ondansetron (ZOFRAN-ODT) 8 MG disintegrating tablet Take 1 tablet (8 mg total) by mouth every 8 (eight) hours as needed for nausea or vomiting.   OneTouch Delica Lancets 03K MISC USE TO CHECK FASTING SUGAR TWICE DAILY   sertraline (ZOLOFT) 100 MG tablet Take 100 mg by mouth daily.    simvastatin (ZOCOR) 80 MG tablet TAKE 1 TABLET BY MOUTH AT BEDTIME   ULTRACARE PEN NEEDLES 32G X 4 MM MISC USE ONCE DAILY WITH INSULIN   No facility-administered medications prior to visit.    Review of Systems  {Labs  Heme  Chem  Endocrine  Serology  Results Review (optional):23779}   Objective    There were no vitals taken for this visit. {Show previous vital signs (optional):23777}  Physical Exam  ***  No results found for any visits on 09/14/21.  Assessment & Plan     ***  No follow-ups on file.      {provider attestation***:1}   Lavon Paganini, MD  [facility] [phone] (phone) [phone] (fax)  [place]

## 2021-09-12 NOTE — Telephone Encounter (Signed)
Patty, Thank you again for speaking with this patient. As I have previously outlined, and as we as a team had previously discussed with the patient, if his pain was progressing or worsening he needed to be evaluated in the emergency department for which she has been told to go on at least 4 occasions since his last procedure. After the last procedure, I gave him a final round of prescription opioid therapy in an effort of trying to keep him out of the hospital first time and was very specific with him and his brother who was with him that I would not be providing any further narcotic medications. As well, after I found out from the patient's family (brother) that his alcohol intake was significantly more than he had previously described to me at the time of his procedure, if I had known that truly I would have had more concern of even attempting ERCP. The patient has chronic pancreatitis and is at risk of having pancreatitis as a result of just his chronic pancreatitis. The patient also seems to be continuing to drink alcohol based on his history and his family history obtained which increases his risk for recurrent pancreatitis too.  If the patient continues to defer on going to the emergency department, he certainly can move forward with getting scheduled laboratories as well as CT imaging to evaluate his pancreas as an outpatient but I cannot give him any further narcotic medication as had been previously discussed with him on his first clinic visit referral/consultation and after my last procedure. He may continue the enzyme replacement therapy which has been prescribed and patient assistance has been set up for him. Complete alcohol cessation is absolute. Continued tobacco cessation is necessary. That is what I can offer at this time.  He certainly can seek care at one of the larger centers to see if they would offer an attempt at ERCP repeat attempt since we could not traverse the pancreatic duct  stone but with his significant alcohol intake still persisting, it is unlikely that he will be offered an ERCP but we certainly can make a referral if that is his desire.  I am forwarding this to his PCP as well as to his referring provider of [facility].  If he reaches back out Patty then please move forward with scheduling a CT scan and laboratories (CBC/CMP/ESR/CRP/lipase/amylase).  No further narcotic medications will be prescribed.  Justice Britain, MD [facility] Office # [phone]

## 2021-09-13 ENCOUNTER — Encounter: Payer: Self-pay | Admitting: Family Medicine

## 2021-09-13 ENCOUNTER — Ambulatory Visit: Payer: Medicare Other | Admitting: Endocrinology

## 2021-09-13 ENCOUNTER — Telehealth: Payer: Self-pay

## 2021-09-13 NOTE — Telephone Encounter (Signed)
Copied from [place] [id number]. Topic: General - Other >> Sep 13, 2021  2:57 PM Valere Dross wrote: Reason for CRM: Pt called in about tomorrows appt 11/10 stating he is on some pain medication and doesn't drive when taking and wanted to give PCP a heads up that he may or may not be able to make it, but he will call in the morning, he also stated he doesn't have to take it all the time due to the procedure not falling through, and is scheduled to get that redone again, please advise.

## 2021-09-14 ENCOUNTER — Ambulatory Visit: Payer: Self-pay | Admitting: Family Medicine

## 2021-09-14 NOTE — Telephone Encounter (Signed)
Patient send a mychart message regarding the same issue. Closing telephone encounter. Please see my chart message.

## 2021-09-17 ENCOUNTER — Telehealth: Payer: Self-pay | Admitting: Internal Medicine

## 2021-09-17 DIAGNOSIS — K852 Alcohol induced acute pancreatitis without necrosis or infection: Secondary | ICD-10-CM

## 2021-09-18 ENCOUNTER — Telehealth (INDEPENDENT_AMBULATORY_CARE_PROVIDER_SITE_OTHER): Payer: Medicare Other | Admitting: Family Medicine

## 2021-09-18 ENCOUNTER — Ambulatory Visit: Payer: Self-pay | Admitting: *Deleted

## 2021-09-18 ENCOUNTER — Encounter: Payer: Self-pay | Admitting: Family Medicine

## 2021-09-18 VITALS — Ht 68.0 in | Wt 175.0 lb

## 2021-09-18 DIAGNOSIS — E1169 Type 2 diabetes mellitus with other specified complication: Secondary | ICD-10-CM

## 2021-09-18 DIAGNOSIS — E785 Hyperlipidemia, unspecified: Secondary | ICD-10-CM | POA: Diagnosis not present

## 2021-09-18 DIAGNOSIS — E1165 Type 2 diabetes mellitus with hyperglycemia: Secondary | ICD-10-CM

## 2021-09-18 NOTE — Telephone Encounter (Signed)
Requested medication (s) are due for refill today:   Provider to review not sure if this will be Dr. Brita Romp or Dr. Neomia Dear with [facility].   Pt has a video appt this morning with Dr. Brita Romp.  Requested medication (s) are on the active medication list:   No not on the list  Future visit scheduled:   Doing a video visit with Dr. Brita Romp 11:20 today (now)   Also scheduled to see Dr. Neomia Dear at [facility] ordered: 08/31/2021 by another provider Justice Britain, Jr MD  Returned because it's a non delegated refill.   Requested Prescriptions  Pending Prescriptions Disp Refills   oxyCODONE (OXY IR/ROXICODONE) 5 MG immediate release tablet [Pharmacy Med Name: OXYCODONE HCL 5 MG TAB] 60 tablet     Sig: TAKE 1 TABLET BY MOUTH EVERY 12 HOURS ASNEEDED FOR SEVERE PAIN     Not Delegated - Analgesics:  Opioid Agonists Failed - 09/17/2021  2:35 PM      Failed - This refill cannot be delegated      Passed - Urine Drug Screen completed in last 360 days      Passed - Valid encounter within last 6 months    Recent Outpatient Visits           Today Type 2 diabetes mellitus with hyperglycemia, without long-term current use of insulin [facility])   [facility] [place], Dionne Bucy, MD   3 weeks ago Type 2 diabetes mellitus with hyperglycemia, with long-term current use of insulin ([place])   [facility] Vigg, Avanti, MD   1 month ago Generalized abdominal pain   [facility] Tally Joe T, FNP   2 months ago Alcohol-induced acute pancreatitis, unspecified complication status   [facility] Gwyneth Sprout, FNP   3 months ago Encounter for annual physical exam   [facility] Bacigalupo, Dionne Bucy, MD       Future Appointments             In 2 weeks Vigg, Avanti, MD [place], [place]

## 2021-09-18 NOTE — Progress Notes (Signed)
MyChart Video Visit converted to telephone visit    Virtual Visit via Telephone Note   This visit type was conducted due to national recommendations for restrictions regarding the COVID-19 Pandemic (e.g. social distancing) in an effort to limit this patient's exposure and mitigate transmission in our community. This patient is at least at moderate risk for complications without adequate follow up. This format is felt to be most appropriate for this patient at this time. Physical exam was limited by quality of the video and audio technology used for the visit.    Patient location: home Provider location: [place] involved in the visit: patient, provider   I discussed the limitations of evaluation and management by telemedicine and the availability of in person appointments. The patient expressed understanding and agreed to proceed.  Interactive audio and video communications were attempted, although failed due to patient's inability to connect to video. Continued visit with audio only interaction with patient agreement.   Patient: Shaun Odonnell   DOB: 1965/12/20   55 y.o. Male  MRN: [id number] Visit Date: 09/18/2021  Today's healthcare provider: Lavon Paganini, MD   Chief Complaint  Patient presents with   Diabetes    Subjective     Diabetes Mellitus Type II, Follow-up  Lab Results  Component Value Date   HGBA1C 11.2 (H) 06/06/2021   HGBA1C 8.2 (A) 12/13/2020   HGBA1C 8.3 (A) 09/12/2020   Wt Readings from Last 3 Encounters:  09/18/21 175 lb (79.4 kg)  08/31/21 177 lb 14.6 oz (80.7 kg)  07/26/21 174 lb 11.2 oz (79.2 kg)   Last seen for diabetes 3 months ago.  Management since then includes no changes. He reports excellent compliance with treatment. He is not having side effects.  Symptoms: No fatigue No foot ulcerations  No appetite changes No nausea  No paresthesia of the feet  No polydipsia  No polyuria No visual disturbances    No vomiting     Home blood sugar records: fasting range: 180-360's  and random  Episodes of hypoglycemia? No    Current insulin regiment: 10 units daily Most Recent Eye Exam: no Current exercise: none Current diet habits: in general, a "healthy" diet    Pertinent Labs: Lab Results  Component Value Date   CHOL 153 06/06/2021   HDL 37 (L) 06/06/2021   LDLCALC 85 06/06/2021   TRIG 189 (H) 06/29/2021   CHOLHDL 4.1 06/06/2021   Lab Results  Component Value Date   NA 138 08/31/2021   K 4.2 08/31/2021   CREATININE 0.50 (L) 08/31/2021   GFRNONAA >60 07/29/2021   MICROALBUR negative 05/03/2020   LABMICR 7.6 06/06/2021     --------------------------------------------------------------------------------------------------- Note: Patient had establish care visit with Dr Neomia Dear at [facility] on 08/18/21. Upcoming f/u with Dr Neomia Dear and lab appt  Has appt with Endocrinology next week for diabetes.  Medications: Outpatient Medications Prior to Visit  Medication Sig   albuterol (PROVENTIL) (2.5 MG/3ML) 0.083% nebulizer solution Take 3 mLs (2.5 mg total) by nebulization every 6 (six) hours as needed for wheezing or shortness of breath.   albuterol (VENTOLIN HFA) 108 (90 Base) MCG/ACT inhaler INHALE 2 PUFFS BY MOUTH EVERY 6 HOURS ASNEEDED WHEEZING/ SHORTNESS OF BREATH   ALPRAZolam (XANAX) 0.5 MG tablet Take 0.5 mg by mouth 4 (four) times daily. Patient takes when wakes up(~0600)/ 1000/ 1600/ 2000   Azelastine-Fluticasone 137-50 MCG/ACT SUSP Place 1 spray into the nose 2 (two) times daily as needed (allergies).  budesonide-formoterol (SYMBICORT) 80-4.5 MCG/ACT inhaler Inhale 2 puffs into the lungs in the morning and at bedtime.   carboxymethylcellulose (REFRESH PLUS) 0.5 % SOLN Place 2 drops into both eyes daily as needed (Dry eyes).   dextromethorphan-guaiFENesin (MUCINEX DM) 30-600 MG 12hr tablet Take 1 tablet by mouth 2 (two) times daily as needed for cough.   Dulaglutide  (TRULICITY) 3 MW/4.1LK SOPN Inject 3 mg into the skin once a week. (Patient taking differently: Inject 3 mg into the skin every Friday.)   glucose blood test strip Use as instructed   insulin glargine (LANTUS SOLOSTAR) 100 UNIT/ML Solostar Pen INJECT 10 UNITS SUBCUTANEOUSLY AT BEDTIME   INVOKANA 300 MG TABS tablet TAKE 1 TABLET BY MOUTH ONCE DAILY BEFOREBREAFKAST   lipase/protease/amylase (CREON) 36000 UNITS CPEP capsule Take 2 capsules (72,000 Units total) by mouth 3 (three) times daily with meals. May also take 1 capsule (36,000 Units total) as needed (with snacks).   meclizine (ANTIVERT) 25 MG tablet Take 1 tablet (25 mg total) by mouth 3 (three) times daily as needed for dizziness.   meloxicam (MOBIC) 15 MG tablet Take 1 tablet (15 mg total) by mouth daily. (Patient taking differently: Take 15 mg by mouth as needed.)   OLANZapine (ZYPREXA) 20 MG tablet Take 20 mg by mouth at bedtime.   omeprazole (PRILOSEC) 40 MG capsule Take 1 capsule (40 mg total) by mouth daily.   ondansetron (ZOFRAN-ODT) 8 MG disintegrating tablet Take 1 tablet (8 mg total) by mouth every 8 (eight) hours as needed for nausea or vomiting.   OneTouch Delica Lancets 44W MISC USE TO CHECK FASTING SUGAR TWICE DAILY   sertraline (ZOLOFT) 100 MG tablet Take 100 mg by mouth daily.    simvastatin (ZOCOR) 80 MG tablet TAKE 1 TABLET BY MOUTH AT BEDTIME   ULTRACARE PEN NEEDLES 32G X 4 MM MISC USE ONCE DAILY WITH INSULIN   No facility-administered medications prior to visit.    Review of Systems  Neurological:  Positive for dizziness.      Objective    Ht 5\' 8"  (1.727 m)   Wt 175 lb (79.4 kg)   BMI 26.61 kg/m     Physical Exam - speaking in full sentences with NAD    Assessment & Plan     Problem List Items Addressed This Visit       Endocrine   Type 2 diabetes mellitus with hyperglycemia, without long-term current use of insulin ([place]) - Primary   Hyperlipidemia associated with type 2 diabetes mellitus ([place])      Discussed patients chronic medical problems and the role of primary care. Patient was dissatisfied with APP care in the past and is really wanting to see an MD/DO.  We discussed that since he established with [facility], he is a patient there now and we should not have 2 primary care offices caring for him. He is planning to keep his upcoming lab and OV appts with Dr Neomia Dear and meet her in person to make sure he is comfortable.  He is worried about what happens if he is not comfortable - discussed that it will be up to [place] whether we accept him back as a new patient at that time.  No changes to meds or labs today as he has appt next week for this with new PCP.  Return for as scheduled, With new PCP.     I discussed the assessment and treatment plan with the patient. The patient was provided an opportunity to ask questions  and all were answered. The patient agreed with the plan and demonstrated an understanding of the instructions.   The patient was advised to call back or seek an in-person evaluation if the symptoms worsen or if the condition fails to improve as anticipated.  I provided 15 minutes of non-face-to-face time during this encounter.  I, Lavon Paganini, MD, have reviewed all documentation for this visit. The documentation on 09/18/21 for the exam, diagnosis, procedures, and orders are all accurate and complete.   Tanise Russman, Dionne Bucy, MD, MPH [facility]

## 2021-09-18 NOTE — Telephone Encounter (Signed)
Pt called in requesting to change his in office visit to a virtual visit this morning at 11:20 with Dr. Brita Romp.   He is being seen for dizziness but is too dizzy to drive into the office.   Doesn't have anyone that can bring him. See triage notes.   He has had dizziness on and off for several months and is being treated with meclizine for it.  I called into [facility] and they were ok with me changing the appt to a virtual.  I wasn't able to change the visit to a [place] office visit.    I called back into [facility] and let them know I wasn't able to change the appt from my end that they would need to change it. The lady I spoke to said she would change it to a virtual as soon as she finished with the pt she had there with her.

## 2021-09-18 NOTE — Telephone Encounter (Signed)
Reason for Disposition  [1] MODERATE dizziness (e.g., interferes with normal activities) AND [2] has NOT been evaluated by physician for this  (Exception: dizziness caused by heat exposure, sudden standing, or poor fluid intake)  Answer Assessment - Initial Assessment Questions 1. DESCRIPTION: "Describe your dizziness."     Pt calling in.   I have an appt with Dr. Brita Romp this morning but I'm too dizzy to drive in. The dizziness has been going on for a while now.   2. LIGHTHEADED: "Do you feel lightheaded?" (e.g., somewhat faint, woozy, weak upon standing)     I'm swimmy headed that started after I woke up.   I have this on and off.   I take Meclizine for it.    I was on so many medications.   My mother did research on metformin and it causes dizziness.   So I'm no longer on it.   It varies as to how long it lasts. He is wanting make his visit virtual.    I don't have anyone that can bring me in. I saw a neurologist for this months ago.   I had a MRI/MRA done it was ok.   I need to see a cardiologist.   I have an endocrinologist appt coming up for my diabetes. 3. VERTIGO: "Do you feel like either you or the room is spinning or tilting?" (i.e. vertigo)     No 4. SEVERITY: "How bad is it?"  "Do you feel like you are going to faint?" "Can you stand and walk?"   - MILD: Feels slightly dizzy, but walking normally.   - MODERATE: Feels unsteady when walking, but not falling; interferes with normal activities (e.g., school, work).   - SEVERE: Unable to walk without falling, or requires assistance to walk without falling; feels like passing out now.      I'm walking fine. 5. ONSET:  "When did the dizziness begin?"     Been on and off several months.   They took me off the metformin and I'm better. 6. AGGRAVATING FACTORS: "Does anything make it worse?" (e.g., standing, change in head position)     Not asked 7. HEART RATE: "Can you tell me your heart rate?" "How many beats in 15 seconds?"  (Note: not  all patients can do this)       Not asked 8. CAUSE: "What do you think is causing the dizziness?"     The dizziness is better off the metformin but I'm still having dizziness on and off.   I'm still able to function. 9. RECURRENT SYMPTOM: "Have you had dizziness before?" If Yes, ask: "When was the last time?" "What happened that time?"     Yes 10. OTHER SYMPTOMS: "Do you have any other symptoms?" (e.g., fever, chest pain, vomiting, diarrhea, bleeding)       No other symptoms 11. PREGNANCY: "Is there any chance you are pregnant?" "When was your last menstrual period?"       N/A  Protocols used: Dizziness - Lightheadedness-A-AH

## 2021-09-18 NOTE — Telephone Encounter (Signed)
Noted  

## 2021-09-19 NOTE — Telephone Encounter (Signed)
Per Dr. Neomia Dear, she will not refill this medication. Refills should come from original PCP at [facility] or a pain clinic.

## 2021-09-19 NOTE — Telephone Encounter (Signed)
Pt called back asking for pain medication.  He said he needs about 5 days of Oxycodone.  CB#  [phone]

## 2021-09-20 ENCOUNTER — Encounter: Payer: Self-pay | Admitting: Gastroenterology

## 2021-09-20 NOTE — Progress Notes (Unsigned)
[facility] labs  CEA 134 ng/mL Amylase 33,091 units/L  Cytopathology Acellular specimen Scant proteinaceous debris present  Based on these values, the cyst is most consistent with a pseudocyst which goes along with the chronicity of that finding on imaging.  An IPMN seems less likely although the CEA was elevated and did not meet the criteria of a an elevated CEA of greater than 192.  Additional work-up including a CT scan should be considered for follow-up as has been outlined previously.  The [facility] will be scanned into the chart and we will make a copy of these and send it to the patient for his records via mail.   Justice Britain, MD [facility] Office # [phone]

## 2021-09-21 ENCOUNTER — Other Ambulatory Visit: Payer: Self-pay | Admitting: Gastroenterology

## 2021-09-21 DIAGNOSIS — Z79899 Other long term (current) drug therapy: Secondary | ICD-10-CM | POA: Diagnosis not present

## 2021-09-21 DIAGNOSIS — K852 Alcohol induced acute pancreatitis without necrosis or infection: Secondary | ICD-10-CM

## 2021-09-26 ENCOUNTER — Ambulatory Visit: Payer: Medicare Other | Admitting: Internal Medicine

## 2021-09-27 ENCOUNTER — Other Ambulatory Visit: Payer: Medicare Other

## 2021-09-28 ENCOUNTER — Telehealth: Payer: Self-pay | Admitting: Family Medicine

## 2021-09-28 DIAGNOSIS — J441 Chronic obstructive pulmonary disease with (acute) exacerbation: Secondary | ICD-10-CM

## 2021-09-28 MED ORDER — PREDNISONE 20 MG PO TABS: ORAL_TABLET | ORAL | 0 refills | Status: DC

## 2021-09-28 MED ORDER — PSEUDOEPH-BROMPHEN-DM 30-2-10 MG/5ML PO SYRP: 5.0000 mL | ORAL_SOLUTION | Freq: Four times a day (QID) | ORAL | 0 refills | Status: DC | PRN

## 2021-09-28 MED ORDER — AMOXICILLIN 500 MG PO CAPS: 500.0000 mg | ORAL_CAPSULE | Freq: Two times a day (BID) | ORAL | 0 refills | Status: DC

## 2021-09-28 NOTE — Telephone Encounter (Signed)
Received On Call Nurse Triage call regarding this patient of Dr Neomia Dear. He is reporting early COPD flare symptoms with productive cough. They state that there is a standing order for Amoxicillin that would be called in and they are asking for Prednisone and Cough Medicine. He requested Hycodan cough syrup however that is controlled and he is aware I cannot prescribe that today on call. Reviewed his chart, I did not see that he was treated or seen for this recently. I did see prior treatments with similar courses to 2020. I will agree to order the Prednisone and non opioid cough syrup to [facility], they also asked if I could send the Amoxicillin since it was "standing" and not yet called in. All meds were sent to [facility]. Follow-up care advised if not improved.  Nobie Putnam, [facility] 09/28/2021, 8:43 AM

## 2021-10-02 ENCOUNTER — Other Ambulatory Visit: Payer: Medicare Other

## 2021-10-02 ENCOUNTER — Other Ambulatory Visit: Payer: Self-pay

## 2021-10-02 DIAGNOSIS — E1165 Type 2 diabetes mellitus with hyperglycemia: Secondary | ICD-10-CM

## 2021-10-02 DIAGNOSIS — K861 Other chronic pancreatitis: Secondary | ICD-10-CM

## 2021-10-03 ENCOUNTER — Telehealth: Payer: Self-pay

## 2021-10-03 ENCOUNTER — Encounter: Payer: Self-pay | Admitting: Internal Medicine

## 2021-10-03 ENCOUNTER — Emergency Department
Admission: EM | Admit: 2021-10-03 | Discharge: 2021-10-03 | Discharge status: Against Medical Advice | Payer: Medicare Other

## 2021-10-03 DIAGNOSIS — K8689 Other specified diseases of pancreas: Secondary | ICD-10-CM

## 2021-10-03 DIAGNOSIS — K860 Alcohol-induced chronic pancreatitis: Secondary | ICD-10-CM

## 2021-10-03 DIAGNOSIS — K862 Cyst of pancreas: Secondary | ICD-10-CM

## 2021-10-03 DIAGNOSIS — R935 Abnormal findings on diagnostic imaging of other abdominal regions, including retroperitoneum: Secondary | ICD-10-CM

## 2021-10-03 DIAGNOSIS — K859 Acute pancreatitis without necrosis or infection, unspecified: Secondary | ICD-10-CM

## 2021-10-03 DIAGNOSIS — R1084 Generalized abdominal pain: Secondary | ICD-10-CM

## 2021-10-03 LAB — COMPREHENSIVE METABOLIC PANEL
ALT: 12 IU/L (ref 0–44)
AST: 12 IU/L (ref 0–40)
Albumin/Globulin Ratio: 2.1 (ref 1.2–2.2)
Albumin: 4.6 g/dL (ref 3.8–4.9)
Alkaline Phosphatase: 131 IU/L — ABNORMAL HIGH (ref 44–121)
BUN/Creatinine Ratio: 18 (ref 9–20)
BUN: 14 mg/dL (ref 6–24)
Bilirubin Total: 0.3 mg/dL (ref 0.0–1.2)
CO2: 22 mmol/L (ref 20–29)
Calcium: 9.6 mg/dL (ref 8.7–10.2)
Chloride: 84 mmol/L — ABNORMAL LOW (ref 96–106)
Creatinine, Ser: 0.76 mg/dL (ref 0.76–1.27)
Globulin, Total: 2.2 g/dL (ref 1.5–4.5)
Glucose: 460 mg/dL — ABNORMAL HIGH (ref 70–99)
Potassium: 5 mmol/L (ref 3.5–5.2)
Sodium: 128 mmol/L — ABNORMAL LOW (ref 134–144)
Total Protein: 6.8 g/dL (ref 6.0–8.5)
eGFR: 107 mL/min/{1.73_m2} (ref >59)

## 2021-10-03 LAB — URINALYSIS, ROUTINE W REFLEX MICROSCOPIC
Bilirubin, UA: NEGATIVE
Leukocytes,UA: NEGATIVE
Nitrite, UA: NEGATIVE
Protein,UA: NEGATIVE
RBC, UA: NEGATIVE
Specific Gravity, UA: <1.005 — ABNORMAL LOW (ref 1.005–1.030)
Urobilinogen, Ur: 0.2 mg/dL (ref 0.2–1.0)
pH, UA: 5.5 (ref 5.0–7.5)

## 2021-10-03 LAB — LIPID PANEL
Chol/HDL Ratio: 6.2 ratio — ABNORMAL HIGH (ref 0.0–5.0)
Cholesterol, Total: 204 mg/dL — ABNORMAL HIGH (ref 100–199)
HDL: 33 mg/dL — ABNORMAL LOW (ref >39)
LDL Chol Calc (NIH): 69 mg/dL (ref 0–99)
Triglycerides: 657 mg/dL (ref 0–149)
VLDL Cholesterol Cal: 102 mg/dL — ABNORMAL HIGH (ref 5–40)

## 2021-10-03 LAB — CBC WITH DIFFERENTIAL/PLATELET
Basophils Absolute: 0 10*3/uL (ref 0.0–0.2)
Basos: 1 %
EOS (ABSOLUTE): 0 10*3/uL (ref 0.0–0.4)
Eos: 0 %
Hematocrit: 46.8 % (ref 37.5–51.0)
Hemoglobin: 15.7 g/dL (ref 13.0–17.7)
Immature Grans (Abs): 0.1 10*3/uL (ref 0.0–0.1)
Immature Granulocytes: 2 %
Lymphocytes Absolute: 0.8 10*3/uL (ref 0.7–3.1)
Lymphs: 19 %
MCH: 32.2 pg (ref 26.6–33.0)
MCHC: 33.5 g/dL (ref 31.5–35.7)
MCV: 96 fL (ref 79–97)
Monocytes Absolute: 0.2 10*3/uL (ref 0.1–0.9)
Monocytes: 5 %
Neutrophils Absolute: 3.1 10*3/uL (ref 1.4–7.0)
Neutrophils: 73 %
Platelets: 143 10*3/uL — ABNORMAL LOW (ref 150–450)
RBC: 4.87 x10E6/uL (ref 4.14–5.80)
RDW: 12.2 % (ref 11.6–15.4)
WBC: 4.2 10*3/uL (ref 3.4–10.8)

## 2021-10-03 LAB — VITAMIN B12: Vitamin B-12: 744 pg/mL (ref 232–1245)

## 2021-10-03 LAB — BAYER DCA HB A1C WAIVED: HB A1C (BAYER DCA - WAIVED): 12.1 % — ABNORMAL HIGH (ref 4.8–5.6)

## 2021-10-03 NOTE — ED Triage Notes (Signed)
Pt states that he can't wait, he's got to go, states that he thought he would be rushed back due to his abnormal labs, pt states that he can't wait now, pt encouraged to drink water, decrease his carb intake, stop drinking the [employer] coffees, and to call his dr back for a repeat blood draw. Pt also states that he has been on prednisone and amoxicillin for a chest cold he's had, pt given information about steroid use and diabetes. Pt states that he will return if he starts feeling bad and states that the will call his dr for a repeat check

## 2021-10-03 NOTE — Telephone Encounter (Signed)
Left message on machine to call back  

## 2021-10-03 NOTE — Telephone Encounter (Signed)
-----   Message from Irving Copas., MD sent at 10/02/2021  4:43 PM EST ----- Regarding: Follow-up Shaun Odonnell, This patient can be scheduled with me for a follow-up in the coming weeks. Before his visit, he needs updated cross-sectional imaging to see how his pancreas looks after our most recent EUS and ERCP. This would be a pancreas protocol CT abdomen. I hope that he has been able to abstain from alcohol completely as I have previously discussed is important for him. I also hope that he has continued to work on tobacco cessation. I am sorry I was not able to help him in regards to accessing the pancreas duct but if he wants to be referred to one of the [facility] advanced endoscopist we can arrange. I am not sure if he will even bring it up, but if he does, we have said we would not be continuing any prescriptions for chronic opioid therapy and we have outlined this previously. He would have to go through his PCP and/or pain clinic.  If he does not have a pain clinic we can certainly put in a referral to pain clinic. Let me know what he decides. Thanks. GM

## 2021-10-03 NOTE — Telephone Encounter (Signed)
Patient made aware of results and verbalized understanding.  Patient stated that he was going to go to ER due to glucose being 460 and having 2+ ketones in urine. after picking up a prescription, I advised to go to the ER and pick up the prescription later and that if he needed to take the prescription then the ER would more than likely give it to him if he let them know he is suppose to be taking one.

## 2021-10-03 NOTE — Telephone Encounter (Signed)
Pt called saying he went to the ER as instructed but the wait was so long he could not wait.   He did get triaged and he talked to the nurse.  She told him that the prednisone would make his glucose go up.  He said the cough medication that was sent in has made his cough worse too.   The nurse in the ED told him if he went home to rest, drink plenty of water and it he didn't feel better or felt worse to come back in.  No fever, just really bad cough.  CB#  [phone]

## 2021-10-03 NOTE — Progress Notes (Signed)
Needs to go to the Er , Glucose 460 , 2+ Ketones in urine please call pt and let him know thnx.

## 2021-10-04 ENCOUNTER — Encounter: Payer: Self-pay | Admitting: Internal Medicine

## 2021-10-04 ENCOUNTER — Telehealth (INDEPENDENT_AMBULATORY_CARE_PROVIDER_SITE_OTHER): Payer: Medicare Other | Admitting: Internal Medicine

## 2021-10-04 ENCOUNTER — Ambulatory Visit: Payer: Medicare Other | Admitting: Internal Medicine

## 2021-10-04 ENCOUNTER — Other Ambulatory Visit: Payer: Self-pay

## 2021-10-04 DIAGNOSIS — R824 Acetonuria: Secondary | ICD-10-CM | POA: Insufficient documentation

## 2021-10-04 DIAGNOSIS — J069 Acute upper respiratory infection, unspecified: Secondary | ICD-10-CM | POA: Diagnosis not present

## 2021-10-04 MED ORDER — CHERATUSSIN AC 100-10 MG/5ML PO SOLN: 5.0000 mL | Freq: Every evening | ORAL | 0 refills | Status: DC

## 2021-10-04 NOTE — Telephone Encounter (Signed)
Pt calling this morning because he did nit stay at the ED as instructed.  I think he waited 45 min he said. Pt states he wants hycodan cough syrup so he can rest. He said he threw the cough syrup prescribed through evisit in the trash b/c it was non narcotic.

## 2021-10-04 NOTE — Telephone Encounter (Signed)
Left message on machine to call back  

## 2021-10-04 NOTE — Progress Notes (Signed)
There were no vitals taken for this visit.   Subjective:    Patient ID: Shaun Odonnell, male    DOB: 30-Apr-1966, 55 y.o.   MRN: [id number]  Chief Complaint  Patient presents with   Cough    Has had cough for past week with sore throat. Patient states that his whole family is positive for Flu A    HPI: [name] is a 55 y.o. male   This visit was completed via telephone due to the restrictions of the COVID-19 pandemic. All issues as above were discussed and addressed but no physical exam was performed. If it was felt that the patient should be evaluated in the office, they were directed there. The patient verbally consented to this visit. Patient was unable to complete an audio/visual visit due to Technical difficulties. Due to the catastrophic nature of the COVID-19 pandemic, this visit was done through audio contact only. Location of the patient: home Location of the provider: work Those involved with this call:  Provider: Charlynne Cousins, MD CMA: Frazier Butt, [facility] Desk/Registration: Myrlene Broker  Time spent on call: 10 minutes on the phone discussing health concerns. 10 minutes total spent in review of patient's record and preparation of their chart. Fsbs - 441 , BP - 112/65 mm hg , HR - 77 bpm  To see endocrinology @ [facility] - next Wednesday     Cough This is a new problem. Associated symptoms include chills, a fever, headaches, nasal congestion, postnasal drip, rhinorrhea and sweats. Pertinent negatives include no chest pain, heartburn, hemoptysis, sore throat, shortness of breath, weight loss or wheezing. Associated symptoms comments: Amoxcillin and pred was sent in by on call physician last week. .  Diabetes He presents for his follow-up (a1c at 12.7) diabetic visit. He has type 2 diabetes mellitus. Hypoglycemia symptoms include headaches and sweats. Pertinent negatives for diabetes include no blurred vision, no chest pain, no fatigue, no foot paresthesias, no  foot ulcerations, no polydipsia, no polyphagia, no polyuria, no visual change, no weakness and no weight loss.   Chief Complaint  Patient presents with   Cough    Has had cough for past week with sore throat. Patient states that his whole family is positive for Flu A    Relevant past medical, surgical, family and social history reviewed and updated as indicated. Interim medical history since our last visit reviewed. Allergies and medications reviewed and updated.  Review of Systems  Constitutional:  Positive for chills and fever. Negative for fatigue and weight loss.  HENT:  Positive for postnasal drip and rhinorrhea. Negative for sore throat.   Eyes:  Negative for blurred vision.  Respiratory:  Positive for cough. Negative for hemoptysis, shortness of breath and wheezing.   Cardiovascular:  Negative for chest pain.  Gastrointestinal:  Negative for heartburn.  Endocrine: Negative for polydipsia, polyphagia and polyuria.  Neurological:  Positive for headaches. Negative for weakness.   Per HPI unless specifically indicated above     Objective:    There were no vitals taken for this visit.  Wt Readings from Last 3 Encounters:  09/18/21 175 lb (79.4 kg)  08/31/21 177 lb 14.6 oz (80.7 kg)  07/26/21 174 lb 11.2 oz (79.2 kg)    Physical Exam  Unable to peform sec to virtual visit.   Results for orders placed or performed in visit on 10/02/21  Urinalysis, Routine w reflex microscopic  Result Value Ref Range   Specific Gravity, UA <1.005 (L) 1.005 -  1.030   pH, UA 5.5 5.0 - 7.5   Color, UA Yellow Yellow   Appearance Ur Clear Clear   Leukocytes,UA Negative Negative   Protein,UA Negative Negative/Trace   Glucose, UA 3+ (A) Negative   Ketones, UA 2+ (A) Negative   RBC, UA Negative Negative   Bilirubin, UA Negative Negative   Urobilinogen, Ur 0.2 0.2 - 1.0 mg/dL   Nitrite, UA Negative Negative  Bayer DCA Hb A1c Waived  Result Value Ref Range   HB A1C (BAYER DCA - WAIVED) 12.1  (H) 4.8 - 5.6 %  Vitamin B12  Result Value Ref Range   Vitamin B-12 744 232 - 1,245 pg/mL  CBC with Differential/Platelet  Result Value Ref Range   WBC 4.2 3.4 - 10.8 x10E3/uL   RBC 4.87 4.14 - 5.80 x10E6/uL   Hemoglobin 15.7 13.0 - 17.7 g/dL   Hematocrit 46.8 37.5 - 51.0 %   MCV 96 79 - 97 fL   MCH 32.2 26.6 - 33.0 pg   MCHC 33.5 31.5 - 35.7 g/dL   RDW 12.2 11.6 - 15.4 %   Platelets 143 (L) 150 - 450 x10E3/uL   Neutrophils 73 Not Estab. %   Lymphs 19 Not Estab. %   Monocytes 5 Not Estab. %   Eos 0 Not Estab. %   Basos 1 Not Estab. %   Neutrophils Absolute 3.1 1.4 - 7.0 x10E3/uL   Lymphocytes Absolute 0.8 0.7 - 3.1 x10E3/uL   Monocytes Absolute 0.2 0.1 - 0.9 x10E3/uL   EOS (ABSOLUTE) 0.0 0.0 - 0.4 x10E3/uL   Basophils Absolute 0.0 0.0 - 0.2 x10E3/uL   Immature Granulocytes 2 Not Estab. %   Immature Grans (Abs) 0.1 0.0 - 0.1 x10E3/uL   Hematology Comments: Note:   Lipid panel  Result Value Ref Range   Cholesterol, Total 204 (H) 100 - 199 mg/dL   Triglycerides 657 (HH) 0 - 149 mg/dL   HDL 33 (L) >39 mg/dL   VLDL Cholesterol Cal 102 (H) 5 - 40 mg/dL   LDL Chol Calc (NIH) 69 0 - 99 mg/dL   Chol/HDL Ratio 6.2 (H) 0.0 - 5.0 ratio  Comprehensive metabolic panel  Result Value Ref Range   Glucose 460 (H) 70 - 99 mg/dL   BUN 14 6 - 24 mg/dL   Creatinine, Ser 0.76 0.76 - 1.27 mg/dL   eGFR 107 >59 mL/min/1.73   BUN/Creatinine Ratio 18 9 - 20   Sodium 128 (L) 134 - 144 mmol/L   Potassium 5.0 3.5 - 5.2 mmol/L   Chloride 84 (L) 96 - 106 mmol/L   CO2 22 20 - 29 mmol/L   Calcium 9.6 8.7 - 10.2 mg/dL   Total Protein 6.8 6.0 - 8.5 g/dL   Albumin 4.6 3.8 - 4.9 g/dL   Globulin, Total 2.2 1.5 - 4.5 g/dL   Albumin/Globulin Ratio 2.1 1.2 - 2.2   Bilirubin Total 0.3 0.0 - 1.2 mg/dL   Alkaline Phosphatase 131 (H) 44 - 121 IU/L   AST 12 0 - 40 IU/L   ALT 12 0 - 44 IU/L        Current Outpatient Medications:    albuterol (PROVENTIL) (2.5 MG/3ML) 0.083% nebulizer solution, Take 3 mLs  (2.5 mg total) by nebulization every 6 (six) hours as needed for wheezing or shortness of breath., Disp: 360 mL, Rfl: 5   albuterol (VENTOLIN HFA) 108 (90 Base) MCG/ACT inhaler, INHALE 2 PUFFS BY MOUTH EVERY 6 HOURS ASNEEDED WHEEZING/ SHORTNESS OF BREATH, Disp: 8.5 g, Rfl:  1   ALPRAZolam (XANAX) 0.5 MG tablet, Take 0.5 mg by mouth 4 (four) times daily. Patient takes when wakes up(~0600)/ 1000/ 1600/ 2000, Disp: , Rfl:    Azelastine-Fluticasone 137-50 MCG/ACT SUSP, Place 1 spray into the nose 2 (two) times daily as needed (allergies)., Disp: , Rfl:    budesonide-formoterol (SYMBICORT) 80-4.5 MCG/ACT inhaler, Inhale 2 puffs into the lungs in the morning and at bedtime., Disp: 1 each, Rfl: 12   carboxymethylcellulose (REFRESH PLUS) 0.5 % SOLN, Place 2 drops into both eyes daily as needed (Dry eyes)., Disp: , Rfl:    Dulaglutide (TRULICITY) 3 JS/9.7WY SOPN, Inject 3 mg into the skin once a week. (Patient taking differently: Inject 3 mg into the skin every Friday.), Disp: 7 mL, Rfl: 0   glucose blood test strip, Use as instructed, Disp: 200 each, Rfl: 12   guaiFENesin-codeine (CHERATUSSIN AC) 100-10 MG/5ML syrup, Take 5 mLs by mouth every evening., Disp: 120 mL, Rfl: 0   insulin glargine (LANTUS SOLOSTAR) 100 UNIT/ML Solostar Pen, INJECT 10 UNITS SUBCUTANEOUSLY AT BEDTIME, Disp: 9 mL, Rfl: 0   INVOKANA 300 MG TABS tablet, TAKE 1 TABLET BY MOUTH ONCE DAILY BEFOREBREAFKAST, Disp: 90 tablet, Rfl: 0   lipase/protease/amylase (CREON) 36000 UNITS CPEP capsule, Take 2 capsules (72,000 Units total) by mouth 3 (three) times daily with meals. May also take 1 capsule (36,000 Units total) as needed (with snacks)., Disp: 240 capsule, Rfl: 11   meclizine (ANTIVERT) 25 MG tablet, Take 1 tablet (25 mg total) by mouth 3 (three) times daily as needed for dizziness., Disp: 90 tablet, Rfl: 3   meloxicam (MOBIC) 15 MG tablet, Take 1 tablet (15 mg total) by mouth daily. (Patient taking differently: Take 15 mg by mouth as  needed.), Disp: 30 tablet, Rfl: 0   OLANZapine (ZYPREXA) 20 MG tablet, Take 20 mg by mouth at bedtime., Disp: , Rfl:    omeprazole (PRILOSEC) 40 MG capsule, Take 1 capsule (40 mg total) by mouth daily., Disp: 30 capsule, Rfl: 6   ondansetron (ZOFRAN-ODT) 8 MG disintegrating tablet, Take 1 tablet (8 mg total) by mouth every 8 (eight) hours as needed for nausea or vomiting., Disp: 20 tablet, Rfl: 0   OneTouch Delica Lancets 63Z MISC, USE TO CHECK FASTING SUGAR TWICE DAILY, Disp: 100 each, Rfl: 1   sertraline (ZOLOFT) 100 MG tablet, Take 100 mg by mouth daily. , Disp: , Rfl:    simvastatin (ZOCOR) 80 MG tablet, TAKE 1 TABLET BY MOUTH AT BEDTIME, Disp: 90 tablet, Rfl: 1   ULTRACARE PEN NEEDLES 32G X 4 MM MISC, USE ONCE DAILY WITH INSULIN, Disp: 90 each, Rfl: 1   amoxicillin (AMOXIL) 500 MG capsule, Take 1 capsule (500 mg total) by mouth 2 (two) times daily. For 7 days (Patient not taking: Reported on 10/04/2021), Disp: 14 capsule, Rfl: 0   brompheniramine-pseudoephedrine-DM 30-2-10 MG/5ML syrup, Take 5 mLs by mouth 4 (four) times daily as needed. (Patient not taking: Reported on 10/04/2021), Disp: 118 mL, Rfl: 0   predniSONE (DELTASONE) 20 MG tablet, Take daily with food. Start with 15m (3 pills) x 2 days, then reduce to 448m(2 pills) x 2 days, then 2067m1 pill) x 3 days (Patient not taking: Reported on 10/04/2021), Disp: 13 tablet, Rfl: 0    Assessment & Plan:  Diabetes : Fasting sugars at 460 with 2+ ketones in his urine . worsening A1c at 12.1 patient is on Trulicity Invokana Lantus 10 units will increase Lantus to 25 units unsure if Trulicity needs  to be discontinued secondary to ongoing pancreatitis issues. Urine shows 2+ ketones will recheck today patient had been sent to the ER yesterday for ketonuria however did not wait as though waiting time in the ER was more than 45 minutes. Discussed with patient that he needs to go to the ER after we recheck his sugars and urine today if we indeed find  more ketones in his urine.  Pt patient is about to head to [facility] ER after having to wait at [facility].  Patient advised to wait his turn and get treatment probably will need a chest x-ray concerns for pneumonia given that he feels very sick and his family has influenza a. Patient advised that he might need IV fluids and IV insulin given that his ketonuria is 2+ and sugars this morning were also 441. Patient verbalized understanding of the above and is agreeable to go with a family member to the ER. Also concerned about ischemic heart disease given that patient is severely diabetic with an A1c of 12.1.  Of note patient is to see [facility] endocrinology next Wednesday.  Problem List Items Addressed This Visit   None Visit Diagnoses     Ketonuria    -  Primary   Relevant Orders   Urinalysis, Routine w reflex microscopic        Orders Placed This Encounter  Procedures   Urinalysis, Routine w reflex microscopic     Meds ordered this encounter  Medications   guaiFENesin-codeine (CHERATUSSIN AC) 100-10 MG/5ML syrup    Sig: Take 5 mLs by mouth every evening.    Dispense:  120 mL    Refill:  0     Follow up plan: No follow-ups on file.

## 2021-10-04 NOTE — Telephone Encounter (Signed)
Noted and d/w pt @ todays visit.

## 2021-10-05 ENCOUNTER — Other Ambulatory Visit: Payer: Medicare Other

## 2021-10-05 ENCOUNTER — Other Ambulatory Visit: Payer: Self-pay

## 2021-10-05 ENCOUNTER — Telehealth: Payer: Self-pay | Admitting: Internal Medicine

## 2021-10-05 DIAGNOSIS — E1165 Type 2 diabetes mellitus with hyperglycemia: Secondary | ICD-10-CM | POA: Diagnosis not present

## 2021-10-05 DIAGNOSIS — K861 Other chronic pancreatitis: Secondary | ICD-10-CM

## 2021-10-05 DIAGNOSIS — R824 Acetonuria: Secondary | ICD-10-CM | POA: Diagnosis not present

## 2021-10-05 LAB — URINALYSIS, ROUTINE W REFLEX MICROSCOPIC
Bilirubin, UA: NEGATIVE
Ketones, UA: NEGATIVE
Leukocytes,UA: NEGATIVE
Nitrite, UA: NEGATIVE
Protein,UA: NEGATIVE
RBC, UA: NEGATIVE
Specific Gravity, UA: <1.005 — ABNORMAL LOW (ref 1.005–1.030)
Urobilinogen, Ur: 0.2 mg/dL (ref 0.2–1.0)
pH, UA: 6 (ref 5.0–7.5)

## 2021-10-05 NOTE — Telephone Encounter (Signed)
Pt is calling to let Dr. Neomia Dear know that he is still sick. Pt reports that the guaiFENesin-codeine (CHERATUSSIN AC) 100-10 MG/5ML syrup [[id number]] is only working for 4 hours. And he ends up coughing imdiately after. Pt is coughing to hard that the his throat is raw and his jawbone hurts. Pt is gargling with salt no working. Preferred Pharmacy- [place], [place] [phone]

## 2021-10-05 NOTE — Telephone Encounter (Signed)
Attempted to call patient, no answer 

## 2021-10-05 NOTE — Telephone Encounter (Signed)
As long as you are not driving and stay home can take the cheratussin q 12hrs , use tessalon pearls 100 mg q 6 hrly. #20

## 2021-10-05 NOTE — Telephone Encounter (Signed)
Left message on machine to call back    Unable to reach the pt will mail letter

## 2021-10-06 ENCOUNTER — Telehealth: Payer: Self-pay

## 2021-10-06 LAB — CBC WITH DIFFERENTIAL
Basophils Absolute: 0.1 10*3/uL (ref 0.0–0.2)
Basos: 1 %
EOS (ABSOLUTE): 0.1 10*3/uL (ref 0.0–0.4)
Eos: 2 %
Hematocrit: 48.4 % (ref 37.5–51.0)
Hemoglobin: 16 g/dL (ref 13.0–17.7)
Immature Grans (Abs): 0.2 10*3/uL — ABNORMAL HIGH (ref 0.0–0.1)
Immature Granulocytes: 3 %
Lymphocytes Absolute: 1.7 10*3/uL (ref 0.7–3.1)
Lymphs: 25 %
MCH: 31.9 pg (ref 26.6–33.0)
MCHC: 33.1 g/dL (ref 31.5–35.7)
MCV: 97 fL (ref 79–97)
Monocytes Absolute: 0.7 10*3/uL (ref 0.1–0.9)
Monocytes: 10 %
Neutrophils Absolute: 3.9 10*3/uL (ref 1.4–7.0)
Neutrophils: 59 %
RBC: 5.01 x10E6/uL (ref 4.14–5.80)
RDW: 12.3 % (ref 11.6–15.4)
WBC: 6.7 10*3/uL (ref 3.4–10.8)

## 2021-10-06 MED ORDER — AMOXICILLIN-POT CLAVULANATE 875-125 MG PO TABS: 1.0000 | ORAL_TABLET | Freq: Two times a day (BID) | ORAL | 0 refills | Status: DC

## 2021-10-06 MED ORDER — FEXOFENADINE HCL 180 MG PO TABS: 180.0000 mg | ORAL_TABLET | Freq: Every day | ORAL | 1 refills | Status: DC

## 2021-10-06 NOTE — Telephone Encounter (Signed)
Called and LVM asking for patient to please return my call.  

## 2021-10-06 NOTE — Telephone Encounter (Signed)
Mychart message sent to patient.

## 2021-10-06 NOTE — Telephone Encounter (Signed)
Called patient he reports that the Cheratussin does not work, only works for like 4 hours, has been taking otc cough syrups with no help, will not take tessalon perles, he is also demanding that you send in the guaifenesin with hydrocodone because he always gets this for his COPD exacerbation.

## 2021-10-06 NOTE — Telephone Encounter (Signed)
Error

## 2021-10-07 ENCOUNTER — Emergency Department: Payer: Medicare Other

## 2021-10-07 ENCOUNTER — Encounter: Payer: Self-pay | Admitting: Emergency Medicine

## 2021-10-07 ENCOUNTER — Other Ambulatory Visit: Payer: Self-pay

## 2021-10-07 DIAGNOSIS — Z5321 Procedure and treatment not carried out due to patient leaving prior to being seen by health care provider: Secondary | ICD-10-CM | POA: Diagnosis not present

## 2021-10-07 DIAGNOSIS — R059 Cough, unspecified: Secondary | ICD-10-CM | POA: Insufficient documentation

## 2021-10-07 DIAGNOSIS — Z20822 Contact with and (suspected) exposure to covid-19: Secondary | ICD-10-CM | POA: Insufficient documentation

## 2021-10-07 LAB — RESP PANEL BY RT-PCR (FLU A&B, COVID) ARPGX2
Influenza A by PCR: NEGATIVE
Influenza B by PCR: NEGATIVE
SARS Coronavirus 2 by RT PCR: NEGATIVE

## 2021-10-07 NOTE — ED Notes (Signed)
No answer when called for triage 

## 2021-10-07 NOTE — ED Triage Notes (Signed)
Pt in with persistent cold-like symptoms, cough since 11/23. States he finished course of Amoxicillin and Prednisone, and just started Augmentin yesterday, as well as cough medicine. States symptoms still no better. Denies any cp or sob. Has not been tested for flu or covid.

## 2021-10-08 ENCOUNTER — Emergency Department
Admission: EM | Admit: 2021-10-08 | Discharge: 2021-10-08 | Disposition: A | Discharge status: Home / Self Care | Payer: Medicare Other | Attending: Emergency Medicine | Admitting: Emergency Medicine

## 2021-10-09 ENCOUNTER — Other Ambulatory Visit: Payer: Self-pay | Admitting: Internal Medicine

## 2021-10-09 ENCOUNTER — Ambulatory Visit: Payer: Medicare Other | Admitting: Internal Medicine

## 2021-10-09 MED ORDER — HYDROCOD POLST-CPM POLST ER 10-8 MG/5ML PO SUER: 5.0000 mL | Freq: Two times a day (BID) | ORAL | 0 refills | Status: DC | PRN

## 2021-10-09 NOTE — Addendum Note (Signed)
Addended by: Timothy Lasso on: 10/09/2021 02:02 PM   Modules accepted: Orders

## 2021-10-09 NOTE — Telephone Encounter (Signed)
The pt returned call and has been scheduled for appt with GM on 11/22/21 at 950 am CT scan order entered and sent to the schedulers.  The pt aware to call if he has not heard from them in 1 week.

## 2021-10-09 NOTE — Telephone Encounter (Signed)
Patient returning your call, states his home number is a better number to reach him at [phone].

## 2021-10-11 ENCOUNTER — Ambulatory Visit: Payer: Medicare Other | Admitting: Endocrinology

## 2021-10-13 ENCOUNTER — Telehealth: Payer: Medicare Other | Admitting: Nurse Practitioner

## 2021-10-13 DIAGNOSIS — J208 Acute bronchitis due to other specified organisms: Secondary | ICD-10-CM | POA: Diagnosis not present

## 2021-10-13 DIAGNOSIS — J441 Chronic obstructive pulmonary disease with (acute) exacerbation: Secondary | ICD-10-CM

## 2021-10-14 MED ORDER — PSEUDOEPH-BROMPHEN-DM 30-2-10 MG/5ML PO SYRP: 5.0000 mL | ORAL_SOLUTION | Freq: Four times a day (QID) | ORAL | 0 refills | Status: DC | PRN

## 2021-10-14 NOTE — Progress Notes (Signed)
We are sorry that you are not feeling well.  Here is how we plan to help!  Based on your presentation I believe you most likely have A cough due to a virus.  This is called viral bronchitis and is best treated by rest, plenty of fluids and control of the cough.  You may use Ibuprofen or Tylenol as directed to help your symptoms.     In addition you may use A prescription cough medication called bromfed. You may take as prescribed We can not send cough syrups with codeine or any controlled substance via evisits or virtual visits. It also appears you recently had a prednisone taper and additional prednisone would be warranted at this time. I have sent a refill of your cough syrup. Please follow up with your PCP in a few days at your next scheduled office visit.    From your responses in the eVisit questionnaire you describe inflammation in the upper respiratory tract which is causing a significant cough.  This is commonly called Bronchitis and has four common causes:   Allergies Viral Infections Acid Reflux Bacterial Infection Allergies, viruses and acid reflux are treated by controlling symptoms or eliminating the cause. An example might be a cough caused by taking certain blood pressure medications. You stop the cough by changing the medication. Another example might be a cough caused by acid reflux. Controlling the reflux helps control the cough.  USE OF BRONCHODILATOR ("RESCUE") INHALERS: There is a risk from using your bronchodilator too frequently.  The risk is that over-reliance on a medication which only relaxes the muscles surrounding the breathing tubes can reduce the effectiveness of medications prescribed to reduce swelling and congestion of the tubes themselves.  Although you feel brief relief from the bronchodilator inhaler, your asthma may actually be worsening with the tubes becoming more swollen and filled with mucus.  This can delay other crucial treatments, such as oral steroid  medications. If you need to use a bronchodilator inhaler daily, several times per day, you should discuss this with your provider.  There are probably better treatments that could be used to keep your asthma under control.     HOME CARE Only take medications as instructed by your medical team. Complete the entire course of an antibiotic. Drink plenty of fluids and get plenty of rest. Avoid close contacts especially the very young and the elderly Cover your mouth if you cough or cough into your sleeve. Always remember to wash your hands A steam or ultrasonic humidifier can help congestion.   GET HELP RIGHT AWAY IF: You develop worsening fever. You become short of breath You cough up blood. Your symptoms persist after you have completed your treatment plan MAKE SURE YOU  Understand these instructions. Will watch your condition. Will get help right away if you are not doing well or get worse.    Thank you for choosing an e-visit.  Your e-visit answers were reviewed by a board certified advanced clinical practitioner to complete your personal care plan. Depending upon the condition, your plan could have included both over the counter or prescription medications.  Please review your pharmacy choice. Make sure the pharmacy is open so you can pick up prescription now. If there is a problem, you may contact your provider through [employer] and have the prescription routed to another pharmacy.  Your safety is important to [place]. If you have drug allergies check your prescription carefully.   For the next 24 hours you can use MyChart  to ask questions about today's visit, request a non-urgent call back, or ask for a work or school excuse. You will get an email in the next two days asking about your experience. I hope that your e-visit has been valuable and will speed your recovery.

## 2021-10-14 NOTE — Progress Notes (Signed)
I have spent 5 minutes in review of e-visit questionnaire, review and updating patient chart, medical decision making and response to patient.  ° °Usher Hedberg W Samira Acero, NP ° °  °

## 2021-10-16 ENCOUNTER — Telehealth: Payer: Self-pay

## 2021-10-16 MED ORDER — HYDROCOD POLST-CPM POLST ER 10-8 MG/5ML PO SUER: 5.0000 mL | Freq: Two times a day (BID) | ORAL | 0 refills | Status: DC | PRN

## 2021-10-16 NOTE — Telephone Encounter (Signed)
Patient calling in to inform PCP  that the directions for his chlorpheniramine-HYDROcodone [facility]) 10-8 MG/5ML SURE  says for him to take it every 12 hours and not every night before bed time and he just wants that to reflect on the bottle when he goes to pick it up. His phone number is [phone]

## 2021-10-17 ENCOUNTER — Encounter: Payer: Self-pay | Admitting: Internal Medicine

## 2021-10-17 ENCOUNTER — Ambulatory Visit (INDEPENDENT_AMBULATORY_CARE_PROVIDER_SITE_OTHER): Payer: Medicare Other | Admitting: Internal Medicine

## 2021-10-17 ENCOUNTER — Other Ambulatory Visit: Payer: Self-pay

## 2021-10-17 VITALS — BP 108/66 | HR 69 | Temp 97.8°F | Ht 68.11 in | Wt 173.0 lb

## 2021-10-17 DIAGNOSIS — J019 Acute sinusitis, unspecified: Secondary | ICD-10-CM

## 2021-10-17 DIAGNOSIS — J069 Acute upper respiratory infection, unspecified: Secondary | ICD-10-CM | POA: Diagnosis not present

## 2021-10-17 DIAGNOSIS — E1165 Type 2 diabetes mellitus with hyperglycemia: Secondary | ICD-10-CM

## 2021-10-17 LAB — URINALYSIS, ROUTINE W REFLEX MICROSCOPIC
Bilirubin, UA: NEGATIVE
Leukocytes,UA: NEGATIVE
Nitrite, UA: NEGATIVE
Protein,UA: NEGATIVE
RBC, UA: NEGATIVE
Specific Gravity, UA: 1.015 (ref 1.005–1.030)
Urobilinogen, Ur: 0.2 mg/dL (ref 0.2–1.0)
pH, UA: 6 (ref 5.0–7.5)

## 2021-10-17 MED ORDER — AMOXICILLIN-POT CLAVULANATE 875-125 MG PO TABS: 1.0000 | ORAL_TABLET | Freq: Two times a day (BID) | ORAL | 0 refills | Status: AC

## 2021-10-17 MED ORDER — FENOFIBRATE 145 MG PO TABS: 145.0000 mg | ORAL_TABLET | Freq: Every day | ORAL | 2 refills | Status: DC

## 2021-10-17 MED ORDER — NICOTINE 21 MG/24HR TD PT24: 21.0000 mg | MEDICATED_PATCH | Freq: Every day | TRANSDERMAL | 0 refills | Status: DC

## 2021-10-17 MED ORDER — LANTUS SOLOSTAR 100 UNIT/ML ~~LOC~~ SOPN: 20.0000 [IU] | PEN_INJECTOR | Freq: Every day | SUBCUTANEOUS | 2 refills | Status: DC

## 2021-10-17 MED ORDER — EMPAGLIFLOZIN 25 MG PO TABS: 25.0000 mg | ORAL_TABLET | Freq: Every day | ORAL | 2 refills | Status: DC

## 2021-10-17 NOTE — Patient Instructions (Signed)
Diabetes Mellitus and Exercise Exercising regularly is important for overall health, especially for people who have diabetes mellitus. Exercising is not only about losing weight. It has many other health benefits, such as increasing muscle strength and bone density and reducing body fat and stress. This leads to improved fitness, flexibility, and endurance, all of which result in better overall health. What are the benefits of exercise if I have diabetes? Exercise has many benefits for people with diabetes. They include: Helping to lower and control blood sugar (glucose). Helping the body to respond better to the hormone insulin by improving insulin sensitivity. Reducing how much insulin the body needs. Lowering the risk for heart disease by: Lowering "bad" cholesterol and triglyceride levels. Increasing "good" cholesterol levels. Lowering blood pressure. Lowering blood glucose levels. What is my activity plan? Your health care provider or certified diabetes educator can help you make a plan for the type and frequency of exercise that works for you. This is called your activity plan. Be sure to: Get at least 150 minutes of medium-intensity or high-intensity exercise each week. Exercises may include brisk walking, biking, or water aerobics. Do stretching and strengthening exercises, such as yoga or weight lifting, at least 2 times a week. Spread out your activity over at least 3 days of the week. Get some form of physical activity each day. Do not go more than 2 days in a row without some kind of physical activity. Avoid being inactive for more than 90 minutes at a time. Take frequent breaks to walk or stretch. Choose exercises or activities that you enjoy. Set realistic goals. Start slowly and gradually increase your exercise intensity over time. How do I manage my diabetes during exercise? Monitor your blood glucose Check your blood glucose before and after exercising. If your blood glucose  is: 240 mg/dL (13.3 mmol/L) or higher before you exercise, check your urine for ketones. These are chemicals created by the liver. If you have ketones in your urine, do not exercise until your blood glucose returns to normal. 100 mg/dL (5.6 mmol/L) or lower, eat a snack containing 15-20 grams of carbohydrate. Check your blood glucose 15 minutes after the snack to make sure that your glucose level is above 100 mg/dL (5.6 mmol/L) before you start your exercise. Know the symptoms of low blood glucose (hypoglycemia) and how to treat it. Your risk for hypoglycemia increases during and after exercise. Follow these tips and your health care provider's instructions Keep a carbohydrate snack that is fast-acting for use before, during, and after exercise to help prevent or treat hypoglycemia. Avoid injecting insulin into areas of the body that are going to be exercised. For example, avoid injecting insulin into: Your arms, when you are about to play tennis. Your legs, when you are about to go jogging. Keep records of your exercise habits. Doing this can help you and your health care provider adjust your diabetes management plan as needed. Write down: Food that you eat before and after you exercise. Blood glucose levels before and after you exercise. The type and amount of exercise you have done. Work with your health care provider when you start a new exercise or activity. He or she may need to: Make sure that the activity is safe for you. Adjust your insulin, other medicines, and food that you eat. Drink plenty of water while you exercise. This prevents loss of water (dehydration) and problems caused by a lot of heat in the body (heat stroke). Where to find more information  American Diabetes Association: www.diabetes.org Summary Exercising regularly is important for overall health, especially for people who have diabetes mellitus. Exercising has many health benefits. It increases muscle strength and bone  density and reduces body fat and stress. It also lowers and controls blood glucose. Your health care provider or certified diabetes educator can help you make an activity plan for the type and frequency of exercise that works for you. Work with your health care provider to make sure any new activity is safe for you. Also work with your health care provider to adjust your insulin, other medicines, and the food you eat. This information is not intended to replace advice given to you by your health care provider. Make sure you discuss any questions you have with your health care provider. Document Revised: 07/20/2019 Document Reviewed: 07/20/2019 Elsevier Patient Education  [place]. Diabetic Ketoacidosis Diabetic ketoacidosis (DKA) is a serious complication of diabetes. This condition develops when there is not enough insulin in the body. Insulin is a hormone that regulates blood sugar (glucose) levels in the body. Normally, insulin allows glucose to enter the cells in the body. The cells break down glucose for energy. Without enough insulin, the body cannot break down glucose and breaks down fats instead. This leads to high blood glucose levels in the body. It also leads to the production of acids that are called ketones. Ketones are poisonous at high levels. If DKA is not treated, it can cause severe dehydration and can lead to a coma or death. What are the causes? This condition develops when a lack of insulin causes the body to break down fats instead of glucose. This may be triggered by: Stress on the body. This stress can be brought on by an illness. Infection. Medicines that raise blood glucose levels. Not taking or skipping doses of diabetes medicines. New onset of type 1 diabetes mellitus. Missing insulin on purpose or by accident. Interruption of insulin through an insulin pump. This can happen if the cannula that connects you to the insulin pump gets dislodged or kinked. What are  the signs or symptoms? Symptoms of this condition include: Early symptoms may include: Excessive thirst or dry mouth or excessive urination. More severe symptoms may include: Abdominal pain, nausea, or vomiting. Vision changes. Fruity or sweet-smelling breath. Irritability or confusion. Rapid breathing. Signs shown by testing include: High blood glucose. High levels of ketones in the body. How is this diagnosed? This condition is diagnosed based on your medical history, a physical exam, and blood tests. You may also have a urine test to check for ketones. How is this treated? This condition may be treated with: Fluid replacement. This may be done with IV fluids to correct dehydration. Correcting high blood glucose with insulin. This may be given through the skin as injections or through an IV. Electrolyte replacement. Electrolytes are minerals in your blood. Electrolytes such as potassium and sodium may be given in pill form or through an IV. Antibiotic medicines. These may be prescribed if your condition was caused by an infection. DKA is a serious medical condition. You may need emergency treatment in the hospital so that you can be monitored closely. Follow these instructions at home: Medicines Take over-the-counter and prescription medicines only as told by your health care provider. Continue to take insulin and other diabetes medicines as told by your health care provider. If you were prescribed an antibiotic medicine, take it as told by your health care provider. Do not stop using the antibiotic  even if you start to feel better. Eating and drinking  Drink enough fluid to keep your urine pale yellow. If you are able to eat, follow your usual diet and drink sugar-free liquids, such as water, tea, and sugar-free soft drinks. You can also have sugar-free gelatin or ice pops. If you are not able to eat, drink liquids that contain sugar in small amounts as you are able. Liquids include  fruit juice, regular soft drinks, and sherbet. Checking ketones and blood glucose  Check your urine for ketones when you are ill and as told by your health care provider. If your blood glucose is 240 mg/dL (13.3 mmol/L) or higher, check your urine ketones every 4 hours. If you have moderate or large ketones, call your health care provider. To check your ketone levels follow these steps: Collect urine in a small cup. Dip a test strip in the urine. Wait for it to change color. Compare the strip to the color chart that comes with the test kit. Check your blood glucose every day, and as often as told by your health care provider. If your blood glucose is high, drink plenty of fluids. This helps flush out ketones. If your blood glucose is above your target for two tests in a row, contact your health care provider. General instructions Carry a medical alert card or wear medical alert jewelry that shows that you have diabetes. Do exercises as told by your health care provider. Do not exercise when your blood glucose is high and you have ketones in your urine. If you get sick, call your health care provider and begin treatment quickly. Your body often needs extra insulin to fight an illness. Check your blood glucose every 4 hours when you are sick. Keep all follow-up visits. This is important. Where to find more information For more information, guidance, and advice, look online here: American Diabetes Association: diabetes.org Association of Diabetes Care & Education Specialists: diabeteseducator.org Contact a health care provider if: Your blood glucose level is higher than 240 mg/dL (13.3 mmol/L) for 2 days in a row. You have moderate or large ketones in your urine. You have a fever. You cannot eat or drink without vomiting. You have been vomiting for more than 2 hours. You continue to have symptoms of DKA. You develop new symptoms. Get help right away if: Your blood glucose monitor reads  high even when you are taking insulin. You faint. You have chest pain or you have trouble breathing. You have sudden trouble speaking or swallowing. You have vomiting or diarrhea that gets worse after 3 hours. You are unable to stay awake or you have trouble thinking. You are severely dehydrated. Symptoms of severe dehydration include: Extreme thirst. Dry mouth. Rapid breathing. These symptoms may represent a serious problem that is an emergency. Do not wait to see if the symptoms will go away. Get medical help right away. Call your local emergency services (911 in the [place] Do not drive yourself to the hospital. Summary DKA is a serious complication of diabetes. This condition develops when there is not enough insulin in the body. This condition is diagnosed based on your medical history, a physical exam, and blood tests. You may also have a urine test to check for ketones. DKA is a serious medical condition. You may need emergency treatment in the hospital to monitor your condition. Contact your health care provider if your blood glucose is higher than 240 mg/dL for 2 days in a row or if you have  moderate or large ketones in your urine. This information is not intended to replace advice given to you by your health care provider. Make sure you discuss any questions you have with your health care provider. Document Revised: 11/01/2020 Document Reviewed: 08/24/2020 Elsevier Patient Education  2022 [employer].

## 2021-10-17 NOTE — Telephone Encounter (Signed)
D/w pt at visit today

## 2021-10-17 NOTE — Progress Notes (Signed)
BP 108/66    Pulse 69    Temp 97.8 F (36.6 C) (Oral)    Ht 5' 8.11" (1.73 m)    Wt 173 lb (78.5 kg)    SpO2 96%    BMI 26.22 kg/m    Subjective:    Patient ID: Shaun Odonnell, male    DOB: [birth year], 55 y.o.   MRN: [id number]  Chief Complaint  Patient presents with   Ketonuria   Hypertension   Sore Throat    Patient states if is from coughing so much, has had since thanksgiving   swollen lymphnode    Started about 3 days ago on the left side of neck    HPI: Shaun Odonnell is a 55 y.o. male  Pt has a ho DM type 2 , HLD , pancreatitis pancreatitic duct is enlarged - is on creon - seeing  Did procedure for stenting to have a CT to have a CAT scan of the abdomen for further investigations on his pancreatic stools/pancreatic issues follow-up and management per general surgery  Hypertension Pertinent negatives include no blurred vision, chest pain, headaches, orthopnea, palpitations or shortness of breath.  Sore Throat  This is a recurrent (LN swollen in left side of the neck submandibular area.) problem. Pertinent negatives include no headaches or shortness of breath.  Diabetes He presents for his follow-up diabetic visit. He has type 2 diabetes mellitus. His disease course has been worsening. Pertinent negatives for hypoglycemia include no headaches. Pertinent negatives for diabetes include no blurred vision, no chest pain, no foot ulcerations and no polydipsia.   Chief Complaint  Patient presents with   Ketonuria   Hypertension   Sore Throat    Patient states if is from coughing so much, has had since thanksgiving   swollen lymphnode    Started about 3 days ago on the left side of neck    Relevant past medical, surgical, family and social history reviewed and updated as indicated. Interim medical history since our last visit reviewed. Allergies and medications reviewed and updated.  Review of Systems  Eyes:  Negative for blurred vision.  Respiratory:  Negative for  shortness of breath.   Cardiovascular:  Negative for chest pain, palpitations and orthopnea.  Endocrine: Negative for polydipsia.  Neurological:  Negative for headaches.   Per HPI unless specifically indicated above     Objective:    BP 108/66    Pulse 69    Temp 97.8 F (36.6 C) (Oral)    Ht 5' 8.11" (1.73 m)    Wt 173 lb (78.5 kg)    SpO2 96%    BMI 26.22 kg/m   Wt Readings from Last 3 Encounters:  10/17/21 173 lb (78.5 kg)  09/18/21 175 lb (79.4 kg)  08/31/21 177 lb 14.6 oz (80.7 kg)    Physical Exam Vitals and nursing note reviewed.  Constitutional:      General: He is not in acute distress.    Appearance: Normal appearance. He is not ill-appearing or diaphoretic.  HENT:     Head: Normocephalic and atraumatic.     Right Ear: Tympanic membrane and external ear normal. There is no impacted cerumen.     Left Ear: External ear normal.     Nose: No congestion or rhinorrhea.     Mouth/Throat:     Pharynx: No oropharyngeal exudate or posterior oropharyngeal erythema.  Eyes:     Conjunctiva/sclera: Conjunctivae normal.     Pupils: Pupils are equal, round, and  reactive to light.  Cardiovascular:     Rate and Rhythm: Normal rate and regular rhythm.     Heart sounds: No murmur heard.   No friction rub. No gallop.  Pulmonary:     Effort: No respiratory distress.     Breath sounds: No stridor. No wheezing or rhonchi.  Chest:     Chest wall: No tenderness.  Abdominal:     General: Abdomen is flat. Bowel sounds are normal.     Palpations: Abdomen is soft. There is no mass.     Tenderness: There is no abdominal tenderness.  Musculoskeletal:     Cervical back: Normal range of motion and neck supple. No rigidity or tenderness.     Left lower leg: No edema.  Skin:    General: Skin is warm and dry.  Neurological:     Mental Status: He is alert.    Results for orders placed or performed in visit on 10/17/21  CBC with Differential/Platelet  Result Value Ref Range   WBC 6.2 3.4  - 10.8 x10E3/uL   RBC 5.33 4.14 - 5.80 x10E6/uL   Hemoglobin 17.4 13.0 - 17.7 g/dL   Hematocrit 51.5 (H) 37.5 - 51.0 %   MCV 97 79 - 97 fL   MCH 32.6 26.6 - 33.0 pg   MCHC 33.8 31.5 - 35.7 g/dL   RDW 12.0 11.6 - 15.4 %   Platelets 198 150 - 450 x10E3/uL   Neutrophils 67 Not Estab. %   Lymphs 21 Not Estab. %   Monocytes 8 Not Estab. %   Eos 2 Not Estab. %   Basos 1 Not Estab. %   Neutrophils Absolute 4.2 1.4 - 7.0 x10E3/uL   Lymphocytes Absolute 1.3 0.7 - 3.1 x10E3/uL   Monocytes Absolute 0.5 0.1 - 0.9 x10E3/uL   EOS (ABSOLUTE) 0.1 0.0 - 0.4 x10E3/uL   Basophils Absolute 0.1 0.0 - 0.2 x10E3/uL   Immature Granulocytes 1 Not Estab. %   Immature Grans (Abs) 0.1 0.0 - 0.1 x10E3/uL  Comprehensive metabolic panel  Result Value Ref Range   Glucose 449 (H) 70 - 99 mg/dL   BUN 11 6 - 24 mg/dL   Creatinine, Ser 0.77 0.76 - 1.27 mg/dL   eGFR 107 >59 mL/min/1.73   BUN/Creatinine Ratio 14 9 - 20   Sodium 132 (L) 134 - 144 mmol/L   Potassium 4.5 3.5 - 5.2 mmol/L   Chloride 88 (L) 96 - 106 mmol/L   CO2 27 20 - 29 mmol/L   Calcium 10.2 8.7 - 10.2 mg/dL   Total Protein 7.6 6.0 - 8.5 g/dL   Albumin 5.0 (H) 3.8 - 4.9 g/dL   Globulin, Total 2.6 1.5 - 4.5 g/dL   Albumin/Globulin Ratio 1.9 1.2 - 2.2   Bilirubin Total 0.3 0.0 - 1.2 mg/dL   Alkaline Phosphatase 153 (H) 44 - 121 IU/L   AST 10 0 - 40 IU/L   ALT 15 0 - 44 IU/L  TSH  Result Value Ref Range   TSH 1.140 0.450 - 4.500 uIU/mL  Urinalysis, Routine w reflex microscopic  Result Value Ref Range   Specific Gravity, UA 1.015 1.005 - 1.030   pH, UA 6.0 5.0 - 7.5   Color, UA Yellow Yellow   Appearance Ur Clear Clear   Leukocytes,UA Negative Negative   Protein,UA Negative Negative/Trace   Glucose, UA 2+ (A) Negative   Ketones, UA Trace (A) Negative   RBC, UA Negative Negative   Bilirubin, UA Negative Negative   Urobilinogen,  Ur 0.2 0.2 - 1.0 mg/dL   Nitrite, UA Negative Negative  Lipid panel  Result Value Ref Range   Cholesterol,  Total 247 (H) 100 - 199 mg/dL   Triglycerides 455 (H) 0 - 149 mg/dL   HDL 41 >39 mg/dL   VLDL Cholesterol Cal 81 (H) 5 - 40 mg/dL   LDL Chol Calc (NIH) 125 (H) 0 - 99 mg/dL   Chol/HDL Ratio 6.0 (H) 0.0 - 5.0 ratio  Urine Microalbumin w/creat. ratio  Result Value Ref Range   Creatinine, Urine 22.0 Not Estab. mg/dL   Microalbumin, Urine 5.3 Not Estab. ug/mL   Microalb/Creat Ratio 24 0 - 29 mg/g creat        Current Outpatient Medications:    albuterol (PROVENTIL) (2.5 MG/3ML) 0.083% nebulizer solution, Take 3 mLs (2.5 mg total) by nebulization every 6 (six) hours as needed for wheezing or shortness of breath., Disp: 360 mL, Rfl: 5   albuterol (VENTOLIN HFA) 108 (90 Base) MCG/ACT inhaler, INHALE 2 PUFFS BY MOUTH EVERY 6 HOURS ASNEEDED WHEEZING/ SHORTNESS OF BREATH, Disp: 8.5 g, Rfl: 1   ALPRAZolam (XANAX) 0.5 MG tablet, Take 0.5 mg by mouth 4 (four) times daily. Patient takes when wakes up(~0600)/ 1000/ 1600/ 2000, Disp: , Rfl:    budesonide-formoterol (SYMBICORT) 80-4.5 MCG/ACT inhaler, Inhale 2 puffs into the lungs in the morning and at bedtime., Disp: 1 each, Rfl: 12   carboxymethylcellulose (REFRESH PLUS) 0.5 % SOLN, Place 2 drops into both eyes daily as needed (Dry eyes)., Disp: , Rfl:    chlorpheniramine-HYDROcodone (TUSSIONEX PENNKINETIC ER) 10-8 MG/5ML SUER, Take 5 mLs by mouth every 12 (twelve) hours as needed for cough., Disp: 115 mL, Rfl: 0   empagliflozin (JARDIANCE) 25 MG TABS tablet, Take 1 tablet (25 mg total) by mouth daily before breakfast., Disp: 30 tablet, Rfl: 2   fenofibrate (TRICOR) 145 MG tablet, Take 1 tablet (145 mg total) by mouth daily., Disp: 30 tablet, Rfl: 2   fexofenadine (ALLEGRA ALLERGY) 180 MG tablet, Take 1 tablet (180 mg total) by mouth daily., Disp: 30 tablet, Rfl: 1   glucose blood test strip, Use as instructed, Disp: 200 each, Rfl: 12   lipase/protease/amylase (CREON) 36000 UNITS CPEP capsule, Take 2 capsules (72,000 Units total) by mouth 3 (three) times  daily with meals. May also take 1 capsule (36,000 Units total) as needed (with snacks)., Disp: 240 capsule, Rfl: 11   nicotine (NICODERM CQ) 21 mg/24hr patch, Place 1 patch (21 mg total) onto the skin daily., Disp: 28 patch, Rfl: 0   OLANZapine (ZYPREXA) 20 MG tablet, Take 20 mg by mouth at bedtime., Disp: , Rfl:    omeprazole (PRILOSEC) 40 MG capsule, Take 1 capsule (40 mg total) by mouth daily., Disp: 30 capsule, Rfl: 6   ondansetron (ZOFRAN-ODT) 8 MG disintegrating tablet, Take 1 tablet (8 mg total) by mouth every 8 (eight) hours as needed for nausea or vomiting., Disp: 20 tablet, Rfl: 0   OneTouch Delica Lancets 54Y MISC, USE TO CHECK FASTING SUGAR TWICE DAILY, Disp: 100 each, Rfl: 1   sertraline (ZOLOFT) 100 MG tablet, Take 100 mg by mouth daily. , Disp: , Rfl:    simvastatin (ZOCOR) 80 MG tablet, TAKE 1 TABLET BY MOUTH AT BEDTIME, Disp: 90 tablet, Rfl: 1   ULTRACARE PEN NEEDLES 32G X 4 MM MISC, USE ONCE DAILY WITH INSULIN, Disp: 90 each, Rfl: 1   Azelastine-Fluticasone 137-50 MCG/ACT SUSP, Place 1 spray into the nose 2 (two) times daily as needed (allergies). (Patient  not taking: Reported on 10/17/2021), Disp: , Rfl:    Dulaglutide (TRULICITY) 3 EH/6.3JS SOPN, Inject 3 mg into the skin once a week for 7 days., Disp: 0.5 mL, Rfl: 0   insulin glargine (LANTUS SOLOSTAR) 100 UNIT/ML Solostar Pen, Inject 20 Units into the skin at bedtime. INJECT 10 UNITS SUBCUTANEOUSLY AT BEDTIME, Disp: 15 mL, Rfl: 2   meclizine (ANTIVERT) 25 MG tablet, Take 1 tablet (25 mg total) by mouth 3 (three) times daily as needed for dizziness. (Patient not taking: Reported on 10/17/2021), Disp: 90 tablet, Rfl: 3   meloxicam (MOBIC) 15 MG tablet, Take 1 tablet (15 mg total) by mouth daily. (Patient not taking: Reported on 10/17/2021), Disp: 30 tablet, Rfl: 0    Assessment & Plan:  Diabetes :  FSBS x 2 days ago was high FSBS - 163 --- 300  worsening A1c at 12.1 patient is on Trulicity Invokana Lantus 10 units unsure if  Trulicity needs to be discontinued secondary to ongoing pancreatitis issues.  Patient to stop Invokana and take Jardiance instead Increase the dose of Lantus to 20 units Has a lot of samples/Trulicity refills will need to DC such inly of ongoing pancreatic issues. Ketonuria the past ; Recheck today sugars have been very high check A1c as well however did not wait as though waiting time in the ER was more than 45 minutes. Discussed with patient that he needs to go to the ER after we recheck his sugars and urine today if we indeed find more ketones in his urine.   Acute sinusitis we will continue Augmentin for 4 more days patient feels about 60% better on his 10-day course to use Tussionex particularly at night    Problem List Items Addressed This Visit       Endocrine   Type 2 diabetes mellitus with hyperglycemia, without long-term current use of insulin (HCC)   Relevant Medications   empagliflozin (JARDIANCE) 25 MG TABS tablet   insulin glargine (LANTUS SOLOSTAR) 100 UNIT/ML Solostar Pen   Other Relevant Orders   CBC with Differential/Platelet (Completed)   Comprehensive metabolic panel (Completed)   TSH (Completed)   Lipid panel (Completed)   Urinalysis, Routine w reflex microscopic (Completed)   Urinalysis, Routine w reflex microscopic   Urine Microalbumin w/creat. ratio (Completed)     Orders Placed This Encounter  Procedures   CBC with Differential/Platelet   Comprehensive metabolic panel   TSH   Lipid panel   Urinalysis, Routine w reflex microscopic   Urinalysis, Routine w reflex microscopic   Urine Microalbumin w/creat. ratio      Meds ordered this encounter  Medications   fenofibrate (TRICOR) 145 MG tablet    Sig: Take 1 tablet (145 mg total) by mouth daily.    Dispense:  30 tablet    Refill:  2   empagliflozin (JARDIANCE) 25 MG TABS tablet    Sig: Take 1 tablet (25 mg total) by mouth daily before breakfast.    Dispense:  30 tablet    Refill:  2   insulin  glargine (LANTUS SOLOSTAR) 100 UNIT/ML Solostar Pen    Sig: Inject 20 Units into the skin at bedtime. INJECT 10 UNITS SUBCUTANEOUSLY AT BEDTIME    Dispense:  15 mL    Refill:  2   amoxicillin-clavulanate (AUGMENTIN) 875-125 MG tablet    Sig: Take 1 tablet by mouth 2 (two) times daily for 4 days.    Dispense:  8 tablet    Refill:  0   nicotine (NICODERM  CQ) 21 mg/24hr patch    Sig: Place 1 patch (21 mg total) onto the skin daily.    Dispense:  28 patch    Refill:  0     Follow up plan: Return in about 4 weeks (around 11/14/2021).

## 2021-10-18 ENCOUNTER — Encounter: Payer: Self-pay | Admitting: Internal Medicine

## 2021-10-18 ENCOUNTER — Other Ambulatory Visit: Payer: Self-pay

## 2021-10-18 ENCOUNTER — Other Ambulatory Visit: Payer: Medicare Other

## 2021-10-18 DIAGNOSIS — E1165 Type 2 diabetes mellitus with hyperglycemia: Secondary | ICD-10-CM | POA: Diagnosis not present

## 2021-10-18 DIAGNOSIS — K8689 Other specified diseases of pancreas: Secondary | ICD-10-CM

## 2021-10-18 LAB — CBC WITH DIFFERENTIAL/PLATELET
Basophils Absolute: 0.1 10*3/uL (ref 0.0–0.2)
Basos: 1 %
EOS (ABSOLUTE): 0.1 10*3/uL (ref 0.0–0.4)
Eos: 2 %
Hematocrit: 51.5 % — ABNORMAL HIGH (ref 37.5–51.0)
Hemoglobin: 17.4 g/dL (ref 13.0–17.7)
Immature Grans (Abs): 0.1 10*3/uL (ref 0.0–0.1)
Immature Granulocytes: 1 %
Lymphocytes Absolute: 1.3 10*3/uL (ref 0.7–3.1)
Lymphs: 21 %
MCH: 32.6 pg (ref 26.6–33.0)
MCHC: 33.8 g/dL (ref 31.5–35.7)
MCV: 97 fL (ref 79–97)
Monocytes Absolute: 0.5 10*3/uL (ref 0.1–0.9)
Monocytes: 8 %
Neutrophils Absolute: 4.2 10*3/uL (ref 1.4–7.0)
Neutrophils: 67 %
Platelets: 198 10*3/uL (ref 150–450)
RBC: 5.33 x10E6/uL (ref 4.14–5.80)
RDW: 12 % (ref 11.6–15.4)
WBC: 6.2 10*3/uL (ref 3.4–10.8)

## 2021-10-18 LAB — COMPREHENSIVE METABOLIC PANEL
ALT: 15 IU/L (ref 0–44)
AST: 10 IU/L (ref 0–40)
Albumin/Globulin Ratio: 1.9 (ref 1.2–2.2)
Albumin: 5 g/dL — ABNORMAL HIGH (ref 3.8–4.9)
Alkaline Phosphatase: 153 IU/L — ABNORMAL HIGH (ref 44–121)
BUN/Creatinine Ratio: 14 (ref 9–20)
BUN: 11 mg/dL (ref 6–24)
Bilirubin Total: 0.3 mg/dL (ref 0.0–1.2)
CO2: 27 mmol/L (ref 20–29)
Calcium: 10.2 mg/dL (ref 8.7–10.2)
Chloride: 88 mmol/L — ABNORMAL LOW (ref 96–106)
Creatinine, Ser: 0.77 mg/dL (ref 0.76–1.27)
Globulin, Total: 2.6 g/dL (ref 1.5–4.5)
Glucose: 449 mg/dL — ABNORMAL HIGH (ref 70–99)
Potassium: 4.5 mmol/L (ref 3.5–5.2)
Sodium: 132 mmol/L — ABNORMAL LOW (ref 134–144)
Total Protein: 7.6 g/dL (ref 6.0–8.5)
eGFR: 107 mL/min/{1.73_m2} (ref >59)

## 2021-10-18 LAB — TSH: TSH: 1.14 u[IU]/mL (ref 0.450–4.500)

## 2021-10-19 ENCOUNTER — Telehealth: Payer: Self-pay | Admitting: Internal Medicine

## 2021-10-19 ENCOUNTER — Ambulatory Visit: Payer: Self-pay | Admitting: *Deleted

## 2021-10-19 DIAGNOSIS — K859 Acute pancreatitis without necrosis or infection, unspecified: Secondary | ICD-10-CM

## 2021-10-19 DIAGNOSIS — K8689 Other specified diseases of pancreas: Secondary | ICD-10-CM

## 2021-10-19 LAB — LIPID PANEL
Chol/HDL Ratio: 6 ratio — ABNORMAL HIGH (ref 0.0–5.0)
Cholesterol, Total: 247 mg/dL — ABNORMAL HIGH (ref 100–199)
HDL: 41 mg/dL (ref >39)
LDL Chol Calc (NIH): 125 mg/dL — ABNORMAL HIGH (ref 0–99)
Triglycerides: 455 mg/dL — ABNORMAL HIGH (ref 0–149)
VLDL Cholesterol Cal: 81 mg/dL — ABNORMAL HIGH (ref 5–40)

## 2021-10-19 LAB — MICROALBUMIN / CREATININE URINE RATIO
Creatinine, Urine: 22 mg/dL
Microalb/Creat Ratio: 24 mg/g creat (ref 0–29)
Microalbumin, Urine: 5.3 ug/mL

## 2021-10-19 LAB — HEMOGLOBIN A1C
Est. average glucose Bld gHb Est-mCnc: 344 mg/dL
Hgb A1c MFr Bld: 13.6 % — ABNORMAL HIGH (ref 4.8–5.6)

## 2021-10-19 NOTE — Telephone Encounter (Signed)
Medication Refill - Medication: Oxycodone 10 mg  Has the patient contacted their pharmacy? No.pt called asking for enough pain med to get him until he has his CT done. (Agent: If no, request that the patient contact the pharmacy for the refill. If patient does not wish to contact the pharmacy document the reason why and proceed with request.) (Agent: If yes, when and what did the pharmacy advise?)  Preferred Pharmacy (with phone number or street name): [facility] Has the patient been seen for an appointment in the last year OR does the patient have an upcoming appointment? Yes.    Agent: Please be advised that RX refills may take up to 3 business days. We ask that you follow-up with your pharmacy.

## 2021-10-19 NOTE — Telephone Encounter (Signed)
He needs to continue simvastatin we werent stoppping this fenofibrate was in addition to this. Pl let him know thnx!!!  Needs to stop invokana and change that to jardiance.

## 2021-10-19 NOTE — Telephone Encounter (Signed)
Answer Assessment - Initial Assessment Questions 1. NAME of MEDICATION: "What medicine are you calling about?"     symbistat 2. QUESTION: "What is your question?" (e.g., double dose of medicine, side effect)     HS or AM 3. PRESCRIBING HCP: "Who prescribed it?" Reason: if prescribed by specialist, call should be referred to that group.     Dr. Neomia Dear 4. SYMPTOMS: "Do you have any symptoms?"     no 5. SEVERITY: If symptoms are present, ask "Are they mild, moderate or severe?"     no 6. PREGNANCY:  "Is there any chance that you are pregnant?" "When was your last menstrual period?"     na  Protocols used: Medication Question Call-A-AH

## 2021-10-19 NOTE — Telephone Encounter (Signed)
°  Chief Complaint: Abdominal Pain Symptoms: Mid abdominal pain, "Under ribs" Frequency: Chronic  "Flare ups" Pertinent Negatives: Patient denies fever, diarrhea,vomiting Disposition: [] ED /[] Urgent Care (no appt availability in office) / [] Appointment(In office/virtual)/ []  [facility]/ [x] Home Care/ [] Refused Recommended Disposition  Additional Notes: Pt requesting Oxycodone be called in for "Flare up of pancreatic pain from stone." After hours call. Assured NT would route to practice for PCPs review.      Reason for Disposition  [1] MODERATE pain (e.g., interferes with normal activities) AND [2] pain comes and goes (cramps) AND [3] present > 24 hours  (Exception: pain with Vomiting or Diarrhea - see that Guideline)  Answer Assessment - Initial Assessment Questions 1. LOCATION: "Where does it hurt?"      Mid abdomen, under rib cage 2. RADIATION: "Does the pain shoot anywhere else?" (e.g., chest, back)     Left to right 3. ONSET: "When did the pain begin?" (Minutes, hours or days ago)      "Long time" flares up 4. SUDDEN: "Gradual or sudden onset?"     Gradual 5. PATTERN "Does the pain come and go, or is it constant?"    - If constant: "Is it getting better, staying the same, or worsening?"      (Note: Constant means the pain never goes away completely; most serious pain is constant and it progresses)     - If intermittent: "How long does it last?" "Do you have pain now?"     (Note: Intermittent means the pain goes away completely between bouts)     Constant 6. SEVERITY: "How bad is the pain?"  (e.g., Scale 1-10; mild, moderate, or severe)    - MILD (1-3): doesn't interfere with normal activities, abdomen soft and not tender to touch     - MODERATE (4-7): interferes with normal activities or awakens from sleep, abdomen tender to touch     - SEVERE (8-10): excruciating pain, doubled over, unable to do any normal activities       6-7/10  "Dull ache" 7. RECURRENT SYMPTOM:  "Have you ever had this type of stomach pain before?" If Yes, ask: "When was the last time?" and "What happened that time?"      Yes flair ups of pancreatic stone  8. CAUSE: "What do you think is causing the stomach pain?"      9. RELIEVING/AGGRAVATING FACTORS: "What makes it better or worse?" (e.g., movement, antacids, bowel movement)      10. OTHER SYMPTOMS: "Do you have any other symptoms?" (e.g., back pain, diarrhea, fever, urination pain, vomiting)       Nausea, gagging  Protocols used: Abdominal Pain - Male-A-AH

## 2021-10-20 NOTE — Telephone Encounter (Signed)
Will need to see pain managament - refills in the meanwhile per gen surgeyr . Pl let pt know  and refer to pain mx. Thnx.

## 2021-10-20 NOTE — Telephone Encounter (Signed)
Patient was made aware that if he wants to have oxycodone prescribed and or refilled he will need to see a pain clinic and has a referral for such. He has been informed numerous times by Dr. Neomia Dear that she will not prescribe this medication.

## 2021-10-20 NOTE — Telephone Encounter (Signed)
I can not see that patient has ever been on this dose of Oxycodone. Patient does have history of 5 mg - but do not see 10 mg. Request sent for reveiw

## 2021-10-20 NOTE — Telephone Encounter (Signed)
Patient made aware that he is to see pain clinic for the oxycodone and verbalized understanding.  Referral in waiting to be signed.

## 2021-10-24 ENCOUNTER — Telehealth: Payer: Self-pay | Admitting: Gastroenterology

## 2021-10-24 ENCOUNTER — Encounter: Payer: Self-pay | Admitting: Internal Medicine

## 2021-10-24 NOTE — Telephone Encounter (Signed)
Inbound call from patient requesting to speak with a nurse please in regards to CT scan he has scheduled.

## 2021-10-24 NOTE — Telephone Encounter (Signed)
The pt wanted to let us know that he rescheduled his CT scan to his area. It will be completed prior to his upcoming appt with Dr Rush Landmark

## 2021-10-26 ENCOUNTER — Ambulatory Visit (HOSPITAL_COMMUNITY): Payer: Medicare Other

## 2021-10-26 ENCOUNTER — Telehealth: Payer: Self-pay | Admitting: Internal Medicine

## 2021-10-26 DIAGNOSIS — E1165 Type 2 diabetes mellitus with hyperglycemia: Secondary | ICD-10-CM

## 2021-10-26 MED ORDER — TRULICITY 3 MG/0.5ML ~~LOC~~ SOAJ: 3.0000 mg | SUBCUTANEOUS | 0 refills | Status: AC

## 2021-10-26 NOTE — Telephone Encounter (Signed)
Patient made aware of results and verbalized understanding.  

## 2021-10-26 NOTE — Telephone Encounter (Signed)
Copied from [place] [phone]. Topic: General - Other >> Oct 26, 2021 12:33 PM Yvette Rack wrote: Reason for CRM: Pt stated he sent a [facility] message to Dr. Neomia Dear regarding sending the reduce dose for Dulaglutide (TRULICITY) 3 CJ/6.7WP SOPN to be sent to [place], [place]  but he has not heard back from anyone. Pt stated he will be out of the medication on Saturday and need the new Rx to be sent to his pharmacy.

## 2021-10-26 NOTE — Telephone Encounter (Signed)
Please let patient know I've sent 1 pen to his pharmacy to wait to see what Dr. Neomia Dear wants to do. She will respond when she returns on Tuesday, but he'll have enough until then- just stay on old dose until she gets back.

## 2021-10-30 ENCOUNTER — Other Ambulatory Visit: Payer: Self-pay | Admitting: Family Medicine

## 2021-10-30 DIAGNOSIS — E1165 Type 2 diabetes mellitus with hyperglycemia: Secondary | ICD-10-CM

## 2021-10-31 DIAGNOSIS — J019 Acute sinusitis, unspecified: Secondary | ICD-10-CM | POA: Insufficient documentation

## 2021-11-01 ENCOUNTER — Encounter: Payer: Self-pay | Admitting: Internal Medicine

## 2021-11-08 ENCOUNTER — Ambulatory Visit: Payer: Medicare Other | Admitting: Internal Medicine

## 2021-11-09 ENCOUNTER — Other Ambulatory Visit: Payer: Self-pay

## 2021-11-09 ENCOUNTER — Telehealth: Payer: Self-pay | Admitting: Internal Medicine

## 2021-11-09 ENCOUNTER — Other Ambulatory Visit: Payer: Medicare Other

## 2021-11-09 DIAGNOSIS — E11649 Type 2 diabetes mellitus with hypoglycemia without coma: Secondary | ICD-10-CM

## 2021-11-09 DIAGNOSIS — E1165 Type 2 diabetes mellitus with hyperglycemia: Secondary | ICD-10-CM

## 2021-11-09 NOTE — Telephone Encounter (Signed)
Cannot reduce trulicity  pl send in refills thnx.  his a1c is 13.6 im referrering him to endocrinology please let him know I think he has had one in the past if he would like to clal them thatll be great  Yolanda Dockendorf

## 2021-11-09 NOTE — Telephone Encounter (Signed)
During pts last visit he discussed with Dr. Neomia Dear decreasing his Trulicity dose/ pt is completely out of medication and advise to use what he had until it was gone and provider would call in the new RX for the lower dosage pens / please advise and send to [facility] asap

## 2021-11-10 ENCOUNTER — Ambulatory Visit: Payer: Medicare Other | Admitting: Internal Medicine

## 2021-11-10 MED ORDER — TRULICITY 3 MG/0.5ML ~~LOC~~ SOAJ: 3.0000 mg | SUBCUTANEOUS | 2 refills | Status: DC

## 2021-11-10 NOTE — Telephone Encounter (Signed)
Patient made aware of results and verbalized understanding.  Refills were sent to pharmacy

## 2021-11-11 ENCOUNTER — Encounter: Payer: Self-pay | Admitting: Emergency Medicine

## 2021-11-11 ENCOUNTER — Other Ambulatory Visit: Payer: Self-pay

## 2021-11-11 DIAGNOSIS — Z5321 Procedure and treatment not carried out due to patient leaving prior to being seen by health care provider: Secondary | ICD-10-CM | POA: Diagnosis not present

## 2021-11-11 DIAGNOSIS — R109 Unspecified abdominal pain: Secondary | ICD-10-CM | POA: Diagnosis present

## 2021-11-11 LAB — COMPREHENSIVE METABOLIC PANEL
ALT: 14 U/L (ref 0–44)
AST: 17 U/L (ref 15–41)
Albumin: 4.5 g/dL (ref 3.5–5.0)
Alkaline Phosphatase: 92 U/L (ref 38–126)
Anion gap: 9 (ref 5–15)
BUN: 12 mg/dL (ref 6–20)
CO2: 29 mmol/L (ref 22–32)
Calcium: 9.3 mg/dL (ref 8.9–10.3)
Chloride: 96 mmol/L — ABNORMAL LOW (ref 98–111)
Creatinine, Ser: 0.64 mg/dL (ref 0.61–1.24)
GFR, Estimated: >60 mL/min (ref >60)
Glucose, Bld: 186 mg/dL — ABNORMAL HIGH (ref 70–99)
Potassium: 3.9 mmol/L (ref 3.5–5.1)
Sodium: 134 mmol/L — ABNORMAL LOW (ref 135–145)
Total Bilirubin: 0.5 mg/dL (ref 0.3–1.2)
Total Protein: 7.7 g/dL (ref 6.5–8.1)

## 2021-11-11 LAB — CBC
HCT: 47.2 % (ref 39.0–52.0)
Hemoglobin: 16.2 g/dL (ref 13.0–17.0)
MCH: 32.2 pg (ref 26.0–34.0)
MCHC: 34.3 g/dL (ref 30.0–36.0)
MCV: 93.8 fL (ref 80.0–100.0)
Platelets: 217 10*3/uL (ref 150–400)
RBC: 5.03 MIL/uL (ref 4.22–5.81)
RDW: 12.4 % (ref 11.5–15.5)
WBC: 11.8 10*3/uL — ABNORMAL HIGH (ref 4.0–10.5)
nRBC: 0 % (ref 0.0–0.2)

## 2021-11-11 LAB — TROPONIN I (HIGH SENSITIVITY): Troponin I (High Sensitivity): 5 ng/L (ref <18)

## 2021-11-11 LAB — LIPASE, BLOOD: Lipase: 244 U/L — ABNORMAL HIGH (ref 11–51)

## 2021-11-11 NOTE — ED Triage Notes (Signed)
Arrived by EMS from home with c/o abdominal. Hx pancreatitis. V/S stable per EMS.

## 2021-11-11 NOTE — ED Triage Notes (Signed)
Pt BIB EMS for abd pain and chest pain since this am; pt reports he has a hx of pancreatitis and feels it may be a flare-up

## 2021-11-11 NOTE — ED Notes (Signed)
EKG performed in triage

## 2021-11-11 NOTE — ED Notes (Signed)
Called pt for repeat troponin and vital signs without answer, pt no longer visualized in lobby.

## 2021-11-11 NOTE — ED Notes (Signed)
Explanation of wait provided to pt multiple times. Pt continues to state he came in an ambulance and thus needed to be seen before others. Explanation of acuity process provided to pt multiple times.

## 2021-11-12 ENCOUNTER — Encounter: Payer: Self-pay | Admitting: Internal Medicine

## 2021-11-12 ENCOUNTER — Emergency Department
Admission: EM | Admit: 2021-11-12 | Discharge: 2021-11-12 | Disposition: A | Discharge status: Home / Self Care | Payer: Medicare Other | Attending: Student in an Organized Health Care Education/Training Program | Admitting: Student in an Organized Health Care Education/Training Program

## 2021-11-12 NOTE — ED Notes (Signed)
No answer when called for repeat vital signs and troponin withou answer, pt no longer visualized in lobby.

## 2021-11-13 ENCOUNTER — Telehealth (INDEPENDENT_AMBULATORY_CARE_PROVIDER_SITE_OTHER): Payer: Commercial Managed Care - HMO | Admitting: Internal Medicine

## 2021-11-13 ENCOUNTER — Telehealth: Payer: Self-pay | Admitting: Gastroenterology

## 2021-11-13 ENCOUNTER — Encounter: Payer: Self-pay | Admitting: Internal Medicine

## 2021-11-13 DIAGNOSIS — K852 Alcohol induced acute pancreatitis without necrosis or infection: Secondary | ICD-10-CM

## 2021-11-13 MED ORDER — OXYCODONE HCL 5 MG PO TABS: 5.0000 mg | ORAL_TABLET | Freq: Four times a day (QID) | ORAL | 0 refills | Status: DC | PRN

## 2021-11-13 NOTE — Telephone Encounter (Signed)
Patient scheduled for 4:20 pm to speak with PCP

## 2021-11-13 NOTE — Telephone Encounter (Signed)
Pt called in complaining of abdominal pain and nausea since Saturday. He did go to the hospital via EMS on Saturday, but left due to long wait times. He has been taking Zofran which has helped with the nausea. Pain has been unrelieved by Tylenol/Advil. He questioned why our office could not prescribe oxycodone, and was educated that we can no longer do that and that he should see his pain clinic, or urgent care/ED. Pt was also advised to continue with full liquids if unable to tolerate diet, and to keep his f/u with [facility] on 11/22/21. Pt is also having a CT done on 11/15/21. No further questions at this time.

## 2021-11-13 NOTE — Telephone Encounter (Signed)
Informed patient that I would reach out to our referral specialist to see what can be done about getting him in to see someone at the pain clinic.

## 2021-11-13 NOTE — Progress Notes (Signed)
There were no vitals taken for this visit.   Subjective:    Patient ID: Shaun Odonnell, male    DOB: 1966/09/16, 56 y.o.   MRN: [id number]  No chief complaint on file.   HPI: Shaun Odonnell is a 56 y.o. male   This visit was completed via telephone due to the restrictions of the COVID-19 pandemic. All issues as above were discussed and addressed but no physical exam was performed. If it was felt that the patient should be evaluated in the office, they were directed there. The patient verbally consented to this visit. Patient was unable to complete an audio/visual visit due to Technical difficulties. Due to the catastrophic nature of the COVID-19 pandemic, this visit was done through audio contact only. Location of the patient: home Location of the provider: work Those involved with this call:  Provider: Charlynne Cousins, MD CMA: Frazier Butt, [place] Desk/Registration: [employer]  Time spent on call: 10 minutes on the phone discussing health concerns. 10 minutes total spent in review of patient's record and preparation of their chart.  Pt says he was hurting and was out of pain medication. Called 911. Is to see gastroenterology. Pt was taken by EMS and had to wait in triage - pt left AMA. Pt was in the ER for acute abdominal pain.  He had Labs Lipase was noted to be 244  Pt hasnt called his GI physician who is supposed to get a CT abdomen on Wednesday for him,   He continues to have abdominal pain today. Per his note today he will see Pain mx for his pain if his medication was refilled today     No chief complaint on file.   Relevant past medical, surgical, family and social history reviewed and updated as indicated. Interim medical history since our last visit reviewed. Allergies and medications reviewed and updated.  Review of Systems  Per HPI unless specifically indicated above     Objective:    There were no vitals taken for this visit.  Wt Readings from Last 3  Encounters:  11/11/21 173 lb (78.5 kg)  10/17/21 173 lb (78.5 kg)  09/18/21 175 lb (79.4 kg)    Physical Exam  Unable to peform sec to virtual visit.   Results for orders placed or performed during the hospital encounter of 11/12/21  Lipase, blood  Result Value Ref Range   Lipase 244 (H) 11 - 51 U/L  Comprehensive metabolic panel  Result Value Ref Range   Sodium 134 (L) 135 - 145 mmol/L   Potassium 3.9 3.5 - 5.1 mmol/L   Chloride 96 (L) 98 - 111 mmol/L   CO2 29 22 - 32 mmol/L   Glucose, Bld 186 (H) 70 - 99 mg/dL   BUN 12 6 - 20 mg/dL   Creatinine, Ser 0.64 0.61 - 1.24 mg/dL   Calcium 9.3 8.9 - 10.3 mg/dL   Total Protein 7.7 6.5 - 8.1 g/dL   Albumin 4.5 3.5 - 5.0 g/dL   AST 17 15 - 41 U/L   ALT 14 0 - 44 U/L   Alkaline Phosphatase 92 38 - 126 U/L   Total Bilirubin 0.5 0.3 - 1.2 mg/dL   GFR, Estimated >60 >60 mL/min   Anion gap 9 5 - 15  CBC  Result Value Ref Range   WBC 11.8 (H) 4.0 - 10.5 K/uL   RBC 5.03 4.22 - 5.81 MIL/uL   Hemoglobin 16.2 13.0 - 17.0 g/dL   HCT 47.2  39.0 - 52.0 %   MCV 93.8 80.0 - 100.0 fL   MCH 32.2 26.0 - 34.0 pg   MCHC 34.3 30.0 - 36.0 g/dL   RDW 12.4 11.5 - 15.5 %   Platelets 217 150 - 400 K/uL   nRBC 0.0 0.0 - 0.2 %  Troponin I (High Sensitivity)  Result Value Ref Range   Troponin I (High Sensitivity) 5 <18 ng/L        Current Outpatient Medications:    albuterol (PROVENTIL) (2.5 MG/3ML) 0.083% nebulizer solution, Take 3 mLs (2.5 mg total) by nebulization every 6 (six) hours as needed for wheezing or shortness of breath., Disp: 360 mL, Rfl: 5   albuterol (VENTOLIN HFA) 108 (90 Base) MCG/ACT inhaler, INHALE 2 PUFFS BY MOUTH EVERY 6 HOURS ASNEEDED WHEEZING/ SHORTNESS OF BREATH, Disp: 8.5 g, Rfl: 1   ALPRAZolam (XANAX) 0.5 MG tablet, Take 0.5 mg by mouth 4 (four) times daily. Patient takes when wakes up(~0600)/ 1000/ 1600/ 2000, Disp: , Rfl:    Azelastine-Fluticasone 137-50 MCG/ACT SUSP, Place 1 spray into the nose 2 (two) times daily as  needed (allergies). (Patient not taking: Reported on 10/17/2021), Disp: , Rfl:    budesonide-formoterol (SYMBICORT) 80-4.5 MCG/ACT inhaler, Inhale 2 puffs into the lungs in the morning and at bedtime., Disp: 1 each, Rfl: 12   carboxymethylcellulose (REFRESH PLUS) 0.5 % SOLN, Place 2 drops into both eyes daily as needed (Dry eyes)., Disp: , Rfl:    chlorpheniramine-HYDROcodone (TUSSIONEX PENNKINETIC ER) 10-8 MG/5ML SUER, Take 5 mLs by mouth every 12 (twelve) hours as needed for cough., Disp: 115 mL, Rfl: 0   Dulaglutide (TRULICITY) 3 XB/2.8UX SOPN, Inject 3 mg as directed once a week., Disp: 4 mL, Rfl: 2   empagliflozin (JARDIANCE) 25 MG TABS tablet, Take 1 tablet (25 mg total) by mouth daily before breakfast., Disp: 30 tablet, Rfl: 2   fenofibrate (TRICOR) 145 MG tablet, Take 1 tablet (145 mg total) by mouth daily., Disp: 30 tablet, Rfl: 2   fexofenadine (ALLEGRA ALLERGY) 180 MG tablet, Take 1 tablet (180 mg total) by mouth daily., Disp: 30 tablet, Rfl: 1   glucose blood test strip, Use as instructed, Disp: 200 each, Rfl: 12   insulin glargine (LANTUS SOLOSTAR) 100 UNIT/ML Solostar Pen, Inject 20 Units into the skin at bedtime. INJECT 10 UNITS SUBCUTANEOUSLY AT BEDTIME, Disp: 15 mL, Rfl: 2   lipase/protease/amylase (CREON) 36000 UNITS CPEP capsule, Take 2 capsules (72,000 Units total) by mouth 3 (three) times daily with meals. May also take 1 capsule (36,000 Units total) as needed (with snacks)., Disp: 240 capsule, Rfl: 11   meclizine (ANTIVERT) 25 MG tablet, Take 1 tablet (25 mg total) by mouth 3 (three) times daily as needed for dizziness. (Patient not taking: Reported on 10/17/2021), Disp: 90 tablet, Rfl: 3   meloxicam (MOBIC) 15 MG tablet, Take 1 tablet (15 mg total) by mouth daily. (Patient not taking: Reported on 10/17/2021), Disp: 30 tablet, Rfl: 0   nicotine (NICODERM CQ) 21 mg/24hr patch, Place 1 patch (21 mg total) onto the skin daily., Disp: 28 patch, Rfl: 0   OLANZapine (ZYPREXA) 20 MG  tablet, Take 20 mg by mouth at bedtime., Disp: , Rfl:    omeprazole (PRILOSEC) 40 MG capsule, Take 1 capsule (40 mg total) by mouth daily., Disp: 30 capsule, Rfl: 6   ondansetron (ZOFRAN-ODT) 8 MG disintegrating tablet, Take 1 tablet (8 mg total) by mouth every 8 (eight) hours as needed for nausea or vomiting., Disp: 20 tablet, Rfl: 0  OneTouch Delica Lancets 82U MISC, USE TO CHECK FASTING SUGAR TWICE DAILY, Disp: 100 each, Rfl: 1   oxyCODONE (OXY IR/ROXICODONE) 5 MG immediate release tablet, Take 1 tablet (5 mg total) by mouth every 6 (six) hours as needed for up to 2 days for severe pain., Disp: 8 tablet, Rfl: 0   sertraline (ZOLOFT) 100 MG tablet, Take 100 mg by mouth daily. , Disp: , Rfl:    simvastatin (ZOCOR) 80 MG tablet, TAKE 1 TABLET BY MOUTH AT BEDTIME, Disp: 90 tablet, Rfl: 1   ULTRACARE PEN NEEDLES 32G X 4 MM MISC, USE ONCE DAILY WITH INSULIN, Disp: 90 each, Rfl: 1    Assessment & Plan:  Pt was made to understand that if his lipase was high at 244  he will need procedures/ scans done sooner than Wednesday.  Will try and call GI nurse  Was advised to call GI asap. He agrees to do this now.  Oxycodone sent in x 2 days Pt made aware that he WILL need to see pain management / GI for further refills of his ongoing pain.  He verbalized understanding of this.    Problem List Items Addressed This Visit       Digestive   Acute pancreatitis   Relevant Medications   oxyCODONE (OXY IR/ROXICODONE) 5 MG immediate release tablet     No orders of the defined types were placed in this encounter.    Meds ordered this encounter  Medications   oxyCODONE (OXY IR/ROXICODONE) 5 MG immediate release tablet    Sig: Take 1 tablet (5 mg total) by mouth every 6 (six) hours as needed for up to 2 days for severe pain.    Dispense:  8 tablet    Refill:  0     Follow up plan: No follow-ups on file.

## 2021-11-13 NOTE — Telephone Encounter (Signed)
Patient called and stated that he went to the ER on Saturday, and was not able to be seen due to the long wait time. Patient is seeking advice if there is anything he can take to help ease the pain. States that he is having abd pain, feels bloated, and cant hardly eat. Patient has an appointment on the 18th. Please advise.

## 2021-11-14 ENCOUNTER — Telehealth: Payer: Self-pay | Admitting: Internal Medicine

## 2021-11-14 ENCOUNTER — Encounter: Payer: Self-pay | Admitting: Internal Medicine

## 2021-11-14 NOTE — Telephone Encounter (Signed)
I called patient to schedule his AWV. While on the phone with patient he stated he is going to be out of pain medication and needs someone to call him. He wants a follow-up call from yesterday's message regarding GI office not prescribing pain medication and pain management taking 2 weeks to review his chart before they will do anything. I advised patient I would have someone clinical call him back in regards to this.

## 2021-11-15 ENCOUNTER — Other Ambulatory Visit: Payer: Self-pay

## 2021-11-15 ENCOUNTER — Encounter: Payer: Self-pay | Admitting: Internal Medicine

## 2021-11-15 ENCOUNTER — Ambulatory Visit
Admission: RE | Admit: 2021-11-15 | Discharge: 2021-11-15 | Disposition: A | Discharge status: Home / Self Care | Payer: Medicare Other | Source: Ambulatory Visit | Attending: Gastroenterology | Admitting: Gastroenterology

## 2021-11-15 ENCOUNTER — Telehealth: Payer: Self-pay

## 2021-11-15 DIAGNOSIS — K8689 Other specified diseases of pancreas: Secondary | ICD-10-CM

## 2021-11-15 DIAGNOSIS — K852 Alcohol induced acute pancreatitis without necrosis or infection: Secondary | ICD-10-CM

## 2021-11-15 DIAGNOSIS — K860 Alcohol-induced chronic pancreatitis: Secondary | ICD-10-CM

## 2021-11-15 DIAGNOSIS — R1084 Generalized abdominal pain: Secondary | ICD-10-CM

## 2021-11-15 DIAGNOSIS — K859 Acute pancreatitis without necrosis or infection, unspecified: Secondary | ICD-10-CM | POA: Diagnosis present

## 2021-11-15 DIAGNOSIS — R935 Abnormal findings on diagnostic imaging of other abdominal regions, including retroperitoneum: Secondary | ICD-10-CM | POA: Diagnosis present

## 2021-11-15 DIAGNOSIS — K862 Cyst of pancreas: Secondary | ICD-10-CM

## 2021-11-15 MED ORDER — OXYCODONE HCL 5 MG PO TABS: 5.0000 mg | ORAL_TABLET | Freq: Four times a day (QID) | ORAL | 0 refills | Status: AC | PRN

## 2021-11-15 MED ORDER — IOHEXOL 300 MG/ML  SOLN
100.0000 mL | Freq: Once | INTRAMUSCULAR | Status: AC | PRN
  Administered 2021-11-15: 100 mL via INTRAVENOUS

## 2021-11-15 NOTE — Telephone Encounter (Signed)
Called patient to ask when his appointment is with Dr. Rush Landmark.  He stated that his appt is on 1/18 at :20. Informed him that Dr. Neomia Dear will fill his oxycodone prescription on for the next 6 days, He is suppose to take 1 Q6hrs. for pain. He was informed that this was the last refill of said medication from Dr. Neomia Dear. He will have to get medication refilled by his GI doctor or from pain management.  Patient verbalized understanding.

## 2021-11-16 ENCOUNTER — Ambulatory Visit (INDEPENDENT_AMBULATORY_CARE_PROVIDER_SITE_OTHER): Payer: Medicare Other | Admitting: *Deleted

## 2021-11-16 ENCOUNTER — Other Ambulatory Visit: Payer: Medicaid Other

## 2021-11-16 DIAGNOSIS — Z0000 Encounter for general adult medical examination without abnormal findings: Secondary | ICD-10-CM

## 2021-11-16 NOTE — Patient Instructions (Signed)
Shaun Odonnell , Thank you for taking time to come for your Medicare Wellness Visit. I appreciate your ongoing commitment to your health goals. Please review the following plan we discussed and let me know if I can assist you in the future.   Screening recommendations/referrals: Colonoscopy: up to date Recommended yearly ophthalmology/optometry visit for glaucoma screening and checkup Recommended yearly dental visit for hygiene and checkup  Vaccinations: Influenza vaccine: up to date Pneumococcal vaccine: up to date Tdap vaccine: up to date Shingles vaccine: up to date     Advanced directives: Education provided  Conditions/risks identified:   Next appointment: 11-29-2021 @ 9:40  [place] 40-64 Years, Male Preventive care refers to lifestyle choices and visits with your health care provider that can promote health and wellness. What does preventive care include? A yearly physical exam. This is also called an annual well check. Dental exams once or twice a year. Routine eye exams. Ask your health care provider how often you should have your eyes checked. Personal lifestyle choices, including: Daily care of your teeth and gums. Regular physical activity. Eating a healthy diet. Avoiding tobacco and drug use. Limiting alcohol use. Practicing safe sex. Taking low-dose aspirin every day starting at age 36. What happens during an annual well check? The services and screenings done by your health care provider during your annual well check will depend on your age, overall health, lifestyle risk factors, and family history of disease. Counseling  Your health care provider may ask you questions about your: Alcohol use. Tobacco use. Drug use. Emotional well-being. Home and relationship well-being. Sexual activity. Eating habits. Work and work Statistician. Screening  You may have the following tests or measurements: Height, weight, and BMI. Blood pressure. Lipid and  cholesterol levels. These may be checked every 5 years, or more frequently if you are over 32 years old. Skin check. Lung cancer screening. You may have this screening every year starting at age 38 if you have a 30-pack-year history of smoking and currently smoke or have quit within the past 15 years. Fecal occult blood test (FOBT) of the stool. You may have this test every year starting at age 66. Flexible sigmoidoscopy or colonoscopy. You may have a sigmoidoscopy every 5 years or a colonoscopy every 10 years starting at age 57. Prostate cancer screening. Recommendations will vary depending on your family history and other risks. Hepatitis C blood test. Hepatitis B blood test. Sexually transmitted disease (STD) testing. Diabetes screening. This is done by checking your blood sugar (glucose) after you have not eaten for a while (fasting). You may have this done every 1-3 years. Discuss your test results, treatment options, and if necessary, the need for more tests with your health care provider. Vaccines  Your health care provider may recommend certain vaccines, such as: Influenza vaccine. This is recommended every year. Tetanus, diphtheria, and acellular pertussis (Tdap, Td) vaccine. You may need a Td booster every 10 years. Zoster vaccine. You may need this after age 38. Pneumococcal 13-valent conjugate (PCV13) vaccine. You may need this if you have certain conditions and have not been vaccinated. Pneumococcal polysaccharide (PPSV23) vaccine. You may need one or two doses if you smoke cigarettes or if you have certain conditions. Talk to your health care provider about which screenings and vaccines you need and how often you need them. This information is not intended to replace advice given to you by your health care provider. Make sure you discuss any questions you have with your  health care provider. Document Released: 11/18/2015 Document Revised: 07/11/2016 Document Reviewed:  08/23/2015 Elsevier Interactive Patient Education  2017 [employer] Prevention in the Home Falls can cause injuries. They can happen to people of all ages. There are many things you can do to make your home safe and to help prevent falls. What can I do on the outside of my home? Regularly fix the edges of walkways and driveways and fix any cracks. Remove anything that might make you trip as you walk through a door, such as a raised step or threshold. Trim any bushes or trees on the path to your home. Use bright outdoor lighting. Clear any walking paths of anything that might make someone trip, such as rocks or tools. Regularly check to see if handrails are loose or broken. Make sure that both sides of any steps have handrails. Any raised decks and porches should have guardrails on the edges. Have any leaves, snow, or ice cleared regularly. Use sand or salt on walking paths during winter. Clean up any spills in your garage right away. This includes oil or grease spills. What can I do in the bathroom? Use night lights. Install grab bars by the toilet and in the tub and shower. Do not use towel bars as grab bars. Use non-skid mats or decals in the tub or shower. If you need to sit down in the shower, use a plastic, non-slip stool. Keep the floor dry. Clean up any water that spills on the floor as soon as it happens. Remove soap buildup in the tub or shower regularly. Attach bath mats securely with double-sided non-slip rug tape. Do not have throw rugs and other things on the floor that can make you trip. What can I do in the bedroom? Use night lights. Make sure that you have a light by your bed that is easy to reach. Do not use any sheets or blankets that are too big for your bed. They should not hang down onto the floor. Have a firm chair that has side arms. You can use this for support while you get dressed. Do not have throw rugs and other things on the floor that can make you  trip. What can I do in the kitchen? Clean up any spills right away. Avoid walking on wet floors. Keep items that you use a lot in easy-to-reach places. If you need to reach something above you, use a strong step stool that has a grab bar. Keep electrical cords out of the way. Do not use floor polish or wax that makes floors slippery. If you must use wax, use non-skid floor wax. Do not have throw rugs and other things on the floor that can make you trip. What can I do with my stairs? Do not leave any items on the stairs. Make sure that there are handrails on both sides of the stairs and use them. Fix handrails that are broken or loose. Make sure that handrails are as long as the stairways. Check any carpeting to make sure that it is firmly attached to the stairs. Fix any carpet that is loose or worn. Avoid having throw rugs at the top or bottom of the stairs. If you do have throw rugs, attach them to the floor with carpet tape. Make sure that you have a light switch at the top of the stairs and the bottom of the stairs. If you do not have them, ask someone to add them for you. What else can  I do to help prevent falls? Wear shoes that: Do not have high heels. Have rubber bottoms. Are comfortable and fit you well. Are closed at the toe. Do not wear sandals. If you use a stepladder: Make sure that it is fully opened. Do not climb a closed stepladder. Make sure that both sides of the stepladder are locked into place. Ask someone to hold it for you, if possible. Clearly mark and make sure that you can see: Any grab bars or handrails. First and last steps. Where the edge of each step is. Use tools that help you move around (mobility aids) if they are needed. These include: Canes. Walkers. Scooters. Crutches. Turn on the lights when you go into a dark area. Replace any light bulbs as soon as they burn out. Set up your furniture so you have a clear path. Avoid moving your furniture  around. If any of your floors are uneven, fix them. If there are any pets around you, be aware of where they are. Review your medicines with your doctor. Some medicines can make you feel dizzy. This can increase your chance of falling. Ask your doctor what other things that you can do to help prevent falls. This information is not intended to replace advice given to you by your health care provider. Make sure you discuss any questions you have with your health care provider. Document Released: 08/18/2009 Document Revised: 03/29/2016 Document Reviewed: 11/26/2014 Elsevier Interactive Patient Education  2017 [employer].

## 2021-11-16 NOTE — Progress Notes (Signed)
Subjective:   Shaun Odonnell is a 56 y.o. male who presents for Medicare Annual/Subsequent preventive examination.  I connected with  Shaun Odonnell on 11/16/21 by a telephone enabled telemedicine application and verified that I am speaking with the correct person using two identifiers.   I discussed the limitations of evaluation and management by telemedicine. The patient expressed understanding and agreed to proceed.  Patient location: home  Provider location: Tele-Health not in office   Review of Systems     Cardiac Risk Factors include: advanced age (>16men, >7 women);diabetes mellitus;obesity (BMI >30kg/m2);sedentary lifestyle;male gender;smoking/ tobacco exposure;hypertension     Objective:    Today's Vitals   11/16/21 1249  PainSc: 3    There is no height or weight on file to calculate BMI.  Advanced Directives 11/16/2021 11/11/2021 08/31/2021 07/29/2021 07/15/2021 07/12/2021 06/28/2021  Does Patient Have a Medical Advance Directive? No No No No No No No  Does patient want to make changes to medical advance directive? - - - - - - -  Would patient like information on creating a medical advance directive? - No - Patient declined No - Patient declined - No - Patient declined No - Patient declined No - Patient declined    Current Medications (verified) Outpatient Encounter Medications as of 11/16/2021  Medication Sig   albuterol (PROVENTIL) (2.5 MG/3ML) 0.083% nebulizer solution Take 3 mLs (2.5 mg total) by nebulization every 6 (six) hours as needed for wheezing or shortness of breath.   albuterol (VENTOLIN HFA) 108 (90 Base) MCG/ACT inhaler INHALE 2 PUFFS BY MOUTH EVERY 6 HOURS ASNEEDED WHEEZING/ SHORTNESS OF BREATH   ALPRAZolam (XANAX) 0.5 MG tablet Take 0.5 mg by mouth 4 (four) times daily. Patient takes when wakes up(~0600)/ 1000/ 1600/ 2000   Azelastine-Fluticasone 137-50 MCG/ACT SUSP Place 1 spray into the nose 2 (two) times daily as needed (allergies).    budesonide-formoterol (SYMBICORT) 80-4.5 MCG/ACT inhaler Inhale 2 puffs into the lungs in the morning and at bedtime.   carboxymethylcellulose (REFRESH PLUS) 0.5 % SOLN Place 2 drops into both eyes daily as needed (Dry eyes).   chlorpheniramine-HYDROcodone (TUSSIONEX PENNKINETIC ER) 10-8 MG/5ML SUER Take 5 mLs by mouth every 12 (twelve) hours as needed for cough.   Dulaglutide (TRULICITY) 3 EL/3.8BO SOPN Inject 3 mg as directed once a week.   empagliflozin (JARDIANCE) 25 MG TABS tablet Take 1 tablet (25 mg total) by mouth daily before breakfast.   fenofibrate (TRICOR) 145 MG tablet Take 1 tablet (145 mg total) by mouth daily.   fexofenadine (ALLEGRA ALLERGY) 180 MG tablet Take 1 tablet (180 mg total) by mouth daily.   glucose blood test strip Use as instructed   insulin glargine (LANTUS SOLOSTAR) 100 UNIT/ML Solostar Pen Inject 20 Units into the skin at bedtime. INJECT 10 UNITS SUBCUTANEOUSLY AT BEDTIME   lipase/protease/amylase (CREON) 36000 UNITS CPEP capsule Take 2 capsules (72,000 Units total) by mouth 3 (three) times daily with meals. May also take 1 capsule (36,000 Units total) as needed (with snacks).   meclizine (ANTIVERT) 25 MG tablet Take 1 tablet (25 mg total) by mouth 3 (three) times daily as needed for dizziness.   meloxicam (MOBIC) 15 MG tablet Take 1 tablet (15 mg total) by mouth daily.   nicotine (NICODERM CQ) 21 mg/24hr patch Place 1 patch (21 mg total) onto the skin daily.   OLANZapine (ZYPREXA) 20 MG tablet Take 20 mg by mouth at bedtime.   omeprazole (PRILOSEC) 40 MG capsule Take 1 capsule (40 mg total)  by mouth daily.   ondansetron (ZOFRAN-ODT) 8 MG disintegrating tablet Take 1 tablet (8 mg total) by mouth every 8 (eight) hours as needed for nausea or vomiting.   OneTouch Delica Lancets 29J MISC USE TO CHECK FASTING SUGAR TWICE DAILY   oxyCODONE (OXY IR/ROXICODONE) 5 MG immediate release tablet Take 1 tablet (5 mg total) by mouth every 6 (six) hours as needed for up to 6 days  for severe pain.   sertraline (ZOLOFT) 100 MG tablet Take 100 mg by mouth daily.    simvastatin (ZOCOR) 80 MG tablet TAKE 1 TABLET BY MOUTH AT BEDTIME   ULTRACARE PEN NEEDLES 32G X 4 MM MISC USE ONCE DAILY WITH INSULIN   No facility-administered encounter medications on file as of 11/16/2021.    Allergies (verified) Fentanyl, Morphine and related, Clonazepam, Pimozide, Trifluoperazine, Azithromycin, Montelukast, Morphine, and Tramadol   History: Past Medical History:  Diagnosis Date   Alcohol abuse 04/29/2019   Allergy    Anxiety    ARDS (adult respiratory distress syndrome) (HCC)    Bipolar disorder (HCC)    Cataract    Chronic kidney disease    per pt - no current problems   COPD (chronic obstructive pulmonary disease) (HCC)    chronic cough and wheezing   Depression    Diabetes mellitus without complication ([place])    type 2   Dizziness    medication related   Dyspnea    with exertion   Elevated lipids    Fatty liver    Hypercholesterolemia    Hypertension    per pt, no current prob-no meds   Pancreatitis    Pneumonia    in past   Pulmonary embolism ([place])    Schizoaffective disorder ([place])    Past Surgical History:  Procedure Laterality Date   BIOPSY  08/31/2021   Procedure: BIOPSY;  Surgeon: Irving Copas., MD;  Location: [place];  Service: Gastroenterology;;   CATARACT EXTRACTION W/PHACO Right 03/26/2018   Procedure: CATARACT EXTRACTION PHACO AND INTRAOCULAR LENS PLACEMENT ([place]) RIGHT BORDERLINE DIABETIC;  Surgeon: Leandrew Koyanagi, MD;  Location: [place];  Service: Ophthalmology;  Laterality: Right;   CATARACT EXTRACTION W/PHACO Left 04/23/2018   Procedure: CATARACT EXTRACTION PHACO AND INTRAOCULAR LENS PLACEMENT ([place])  LEFT BORDERLINE DIABETIC;  Surgeon: Leandrew Koyanagi, MD;  Location: [place];  Service: Ophthalmology;  Laterality: Left;   COLONOSCOPY     COLONOSCOPY WITH PROPOFOL N/A 08/21/2017   Procedure:  COLONOSCOPY WITH PROPOFOL;  Surgeon: Manya Silvas, MD;  [facility] ENDOSCOPY;  Service: Endoscopy;  Laterality: N/A;   ENDOSCOPIC RETROGRADE CHOLANGIOPANCREATOGRAPHY (ERCP) WITH PROPOFOL N/A 08/31/2021   Procedure: ENDOSCOPIC RETROGRADE CHOLANGIOPANCREATOGRAPHY (ERCP) WITH PROPOFOL;  Surgeon: Rush Landmark Telford Nab., MD;  Location: [place];  Service: Gastroenterology;  Laterality: N/A;   ESOPHAGOGASTRODUODENOSCOPY (EGD) WITH PROPOFOL N/A 08/31/2021   Procedure: ESOPHAGOGASTRODUODENOSCOPY (EGD) WITH PROPOFOL;  Surgeon: Rush Landmark Telford Nab., MD;  Location: [place];  Service: Gastroenterology;  Laterality: N/A;   EYE SURGERY     FINE NEEDLE ASPIRATION N/A 08/31/2021   Procedure: FINE NEEDLE ASPIRATION (FNA) LINEAR;  Surgeon: Irving Copas., MD;  Location: [place];  Service: Gastroenterology;  Laterality: N/A;   HERNIA REPAIR     inguinal and umbilical   RIB RESECTION N/A 08/20/2019   Procedure: removal of xyphoid process;  Surgeon: Nestor Lewandowsky, MD;  Location: [facility];  Service: Thoracic;  Laterality: N/A;   SPHINCTEROTOMY  08/31/2021   Procedure: Joan Mayans;  Surgeon: Mansouraty, Telford Nab., MD;  Location: [facility]  ENDOSCOPY;  Service: Gastroenterology;;   UMBILICAL HERNIA REPAIR N/A 06/22/2019   Procedure: OPEN HERNIA REPAIR UMBILICAL ADULT;  Surgeon: Olean Ree, MD;  Location: [facility];  Service: General;  Laterality: N/A;   UPPER ESOPHAGEAL ENDOSCOPIC ULTRASOUND (EUS) N/A 08/31/2021   Procedure: UPPER ESOPHAGEAL ENDOSCOPIC ULTRASOUND (EUS);  Surgeon: Irving Copas., MD;  Location: [place];  Service: Gastroenterology;  Laterality: N/A;   UPPER GI ENDOSCOPY     Family History  Problem Relation Age of Onset   Hyperlipidemia Mother    Hypertension Father    Diabetes Maternal Aunt    Dementia Maternal Grandmother    Heart disease Maternal Grandfather    Heart disease Paternal Grandmother    Stroke Paternal Grandfather    Diabetes Paternal  Grandfather    Colon cancer Neg Hx    Esophageal cancer Neg Hx    Inflammatory bowel disease Neg Hx    Liver disease Neg Hx    Pancreatic cancer Neg Hx    Stomach cancer Neg Hx    Rectal cancer Neg Hx    Social History   Socioeconomic History   Marital status: Single    Spouse name: Not on file   Number of children: 0   Years of education: Not on file   Highest education level: Bachelor's degree (e.g., BA, AB, BS)  Occupational History   Occupation: disabled  Tobacco Use   Smoking status: Every Day    Packs/day: 1.50    Years: 34.00    Pack years: 51.00    Types: Cigarettes   Smokeless tobacco: Former    Types: Snuff    Quit date: 1990  Vaping Use   Vaping Use: Every day   Substances: Nicotine, Flavoring  Substance and Sexual Activity   Alcohol use: Yes    Alcohol/week: 6.0 standard drinks    Types: 6 Cans of beer per week    Comment: 3 beers 2x a week   Drug use: Not Currently   Sexual activity: Not Currently  Other Topics Concern   Not on file  Social History Narrative   Not on file   Social Determinants of Health   Financial Resource Strain: Low Risk    Difficulty of Paying Living Expenses: Not hard at all  Food Insecurity: No Food Insecurity   Worried About Charity fundraiser in the Last Year: Never true   Ran Out of Food in the Last Year: Never true  Transportation Needs: No Transportation Needs   Lack of Transportation (Medical): No   Lack of Transportation (Non-Medical): No  Physical Activity: Inactive   Days of Exercise per Week: 0 days   Minutes of Exercise per Session: 0 min  Stress: No Stress Concern Present   Feeling of Stress : Not at all  Social Connections: Moderately Isolated   Frequency of Communication with Friends and Family: More than three times a week   Frequency of Social Gatherings with Friends and Family: Three times a week   Attends Religious Services: More than 4 times per year   Active Member of Clubs or Organizations: No    Attends Archivist Meetings: Never   Marital Status: Never married    Tobacco Counseling Ready to quit: Not Answered Counseling given: Not Answered   Clinical Intake:  Pre-visit preparation completed: Yes  Pain : No/denies pain Pain Score: 3  Pain Type: Acute pain Pain Location:  (blocked pancreatic duct) Pain Descriptors / Indicators: Constant Pain Frequency: Intermittent     Nutritional  Risks: None Diabetes: Yes CBG done?: No Did pt. bring in CBG monitor from home?: No  How often do you need to have someone help you when you read instructions, pamphlets, or other written materials from your doctor or pharmacy?: 1 - Never  Diabetic?  Yes  Nutrition Risk Assessment:  Has the patient had any N/V/D within the last 2 months?  No  Does the patient have any non-healing wounds?  No  Has the patient had any unintentional weight loss or weight gain?  No   Diabetes:  Is the patient diabetic?  Yes  If diabetic, was a CBG obtained today?  No  Did the patient bring in their glucometer from home?  No  How often do you monitor your CBG's? Sometimes 2x day  .   Financial Strains and Diabetes Management:  Are you having any financial strains with the device, your supplies or your medication? No .  Does the patient want to be seen by Chronic Care Management for management of their diabetes?  No  Would the patient like to be referred to a Nutritionist or for Diabetic Management?  No   Diabetic Exams:  Diabetic Eye Exam: Completed . Overdue for diabetic eye exam. Pt has been advised about the importance in completing this exam.   Diabetic Foot Exam: Completed . Pt has been advised about the importance in completing this exam.   Interpreter Needed?: No  Information entered by :: Leroy Kennedy LPN   Activities of Daily Living In your present state of health, do you have any difficulty performing the following activities: 11/16/2021 07/26/2021  Hearing? N N  Vision? N  N  Difficulty concentrating or making decisions? N Y  Walking or climbing stairs? N N  Dressing or bathing? N N  Doing errands, shopping? N N  Preparing Food and eating ? N -  Using the Toilet? N -  In the past six months, have you accidently leaked urine? Y -  Do you have problems with loss of bowel control? N -  Managing your Medications? N -  Managing your Finances? N -  Housekeeping or managing your Housekeeping? N -  Some recent data might be hidden    Patient Care Team: Charlynne Cousins, MD as PCP - General (Internal Medicine) Leandrew Koyanagi, MD as Referring Physician (Ophthalmology)  Indicate any recent Medical Services you may have received from other than [facility] providers in the past year (date may be approximate).     Assessment:   This is a routine wellness examination for Shaun Odonnell.  Hearing/Vision screen Hearing Screening - Comments:: No trouble hearing Vision Screening - Comments:: Up to date [place]  Dietary issues and exercise activities discussed: Current Exercise Habits: The patient does not participate in regular exercise at present   Goals Addressed             This Visit's Progress    Increase physical activity       Lower blood sugar       Depression Screen PHQ 2/9 Scores 11/16/2021 10/17/2021 07/26/2021 07/19/2021 07/15/2021 12/13/2020 09/12/2020  PHQ - 2 Score 1 1 1 1 4 2 1   PHQ- 9 Score 2 4 4 3 12 2 2     Fall Risk Fall Risk  11/16/2021 10/17/2021 08/28/2021 07/26/2021 07/19/2021  Falls in the past year? 0 1 1 0 0  Number falls in past yr: 0 1 1 0 0  Injury with Fall? 0 1 1 0 0  Comment - - - - -  Risk for fall due to : - - Medication side effect No Fall Risks No Fall Risks  Follow up Falls evaluation completed;Falls prevention discussed Falls evaluation completed Falls evaluation completed - -    FALL RISK PREVENTION PERTAINING TO THE HOME:  Any stairs in or around the home? No  If so, are there any without handrails? No  Home free of  loose throw rugs in walkways, pet beds, electrical cords, etc? Yes  Adequate lighting in your home to reduce risk of falls? Yes   ASSISTIVE DEVICES UTILIZED TO PREVENT FALLS:  Life alert? No  Use of a cane, walker or w/c? No  Grab bars in the bathroom? No  Shower chair or bench in shower? No  Elevated toilet seat or a handicapped toilet? Yes   TIMED UP AND GO:  Was the test performed? No .    Cognitive Function:  Normal cognitive status assessed by direct observation by this Nurse Health Advisor. No abnormalities found.       6CIT Screen 07/15/2021  What Year? 0 points  What month? 0 points  What time? 0 points  Count back from 20 0 points  Months in reverse 0 points  Repeat phrase 2 points  Total Score 2    Immunizations Immunization History  Administered Date(s) Administered   Dtap, Unspecified 10/24/2011   Influenza Split 09/26/2015   Influenza,inj,Quad PF,6+ Mos 08/06/2019, 09/12/2020   Influenza-Unspecified 10/16/2010, 08/09/2021   PFIZER(Purple Top)SARS-COV-2 Vaccination 01/23/2020, 02/16/2020, 11/07/2020   Pneumococcal Polysaccharide-23 10/24/2012   Tdap 09/12/2020    TDAP status: Up to date  Flu Vaccine status: Up to date  Pneumococcal vaccine status: Up to date  Covid-19 vaccine status: Information provided on how to obtain vaccines.   Qualifies for Shingles Vaccine? No   Zostavax completed No   Shingrix Completed?: Yes  Screening Tests Health Maintenance  Topic Date Due   Zoster Vaccines- Shingrix (1 of 2) Never done   COVID-19 Vaccine (4 - Booster for Pfizer series) 01/02/2021   OPHTHALMOLOGY EXAM  06/03/2021   Pneumococcal Vaccine 67-75 Years old (2 - PCV) 05/18/2022 (Originally 10/24/2013)   HEMOGLOBIN A1C  04/18/2022   FOOT EXAM  06/05/2022   URINE MICROALBUMIN  10/18/2022   COLONOSCOPY (Pts 45-40yrs Insurance coverage will need to be confirmed)  08/22/2027   TETANUS/TDAP  09/12/2030   INFLUENZA VACCINE  Completed   HIV Screening   Completed   HPV VACCINES  Aged Out    Health Maintenance  Patient will bring documentation for shingles to upcoming visit  Health Maintenance Due  Topic Date Due   Zoster Vaccines- Shingrix (1 of 2) Never done   COVID-19 Vaccine (4 - Booster for Pfizer series) 01/02/2021   OPHTHALMOLOGY EXAM  06/03/2021    Colorectal cancer screening: Type of screening: Colonoscopy. Completed 2018. Repeat every   years   patient is scheduled for a colonoscopy  Lung Cancer Screening: (Low Dose CT Chest recommended if Age 69-80 years, 30 pack-year currently smoking OR have quit w/in 15years.) does not qualify.   Lung Cancer Screening Referral:   Additional Screening:  Hepatitis C Screening: does not qualify;   Vision Screening: Recommended annual ophthalmology exams for early detection of glaucoma and other disorders of the eye. Is the patient up to date with their annual eye exam?  Yes  Who is the provider or what is the name of the office in which the patient attends annual eye exams? [facility] If pt is not established with a provider, would they  like to be referred to a provider to establish care? No .   Dental Screening: Recommended annual dental exams for proper oral hygiene  Community Resource Referral / Chronic Care Management: CRR required this visit?  No   CCM required this visit?  No      Plan:     I have personally reviewed and noted the following in the patients chart:   Medical and social history Use of alcohol, tobacco or illicit drugs  Current medications and supplements including opioid prescriptions. Patient is currently taking opioid prescriptions. Information provided to patient regarding non-opioid alternatives. Patient advised to discuss non-opioid treatment plan with their provider. Functional ability and status Nutritional status Physical activity Advanced directives List of other physicians Hospitalizations, surgeries, and ER visits in previous 12  months Vitals Screenings to include cognitive, depression, and falls Referrals and appointments  In addition, I have reviewed and discussed with patient certain preventive protocols, quality metrics, and best practice recommendations. A written personalized care plan for preventive services as well as general preventive health recommendations were provided to patient.     Leroy Kennedy, LPN   [id number]/2233   Nurse Notes:

## 2021-11-16 NOTE — Telephone Encounter (Signed)
Referral to a new pain clinic

## 2021-11-17 ENCOUNTER — Ambulatory Visit: Payer: Medicaid Other | Admitting: Internal Medicine

## 2021-11-22 ENCOUNTER — Ambulatory Visit (INDEPENDENT_AMBULATORY_CARE_PROVIDER_SITE_OTHER): Payer: Medicare Other | Admitting: Gastroenterology

## 2021-11-22 ENCOUNTER — Telehealth: Payer: Self-pay | Admitting: Gastroenterology

## 2021-11-22 ENCOUNTER — Encounter: Payer: Self-pay | Admitting: Gastroenterology

## 2021-11-22 ENCOUNTER — Other Ambulatory Visit: Payer: Self-pay | Admitting: Family Medicine

## 2021-11-22 VITALS — BP 110/70 | HR 65 | Ht 68.0 in | Wt 179.0 lb

## 2021-11-22 DIAGNOSIS — R1084 Generalized abdominal pain: Secondary | ICD-10-CM | POA: Diagnosis not present

## 2021-11-22 DIAGNOSIS — K862 Cyst of pancreas: Secondary | ICD-10-CM | POA: Diagnosis not present

## 2021-11-22 DIAGNOSIS — E1169 Type 2 diabetes mellitus with other specified complication: Secondary | ICD-10-CM

## 2021-11-22 DIAGNOSIS — G8929 Other chronic pain: Secondary | ICD-10-CM

## 2021-11-22 DIAGNOSIS — K859 Acute pancreatitis without necrosis or infection, unspecified: Secondary | ICD-10-CM

## 2021-11-22 DIAGNOSIS — K861 Other chronic pancreatitis: Secondary | ICD-10-CM

## 2021-11-22 DIAGNOSIS — K8689 Other specified diseases of pancreas: Secondary | ICD-10-CM

## 2021-11-22 DIAGNOSIS — F1021 Alcohol dependence, in remission: Secondary | ICD-10-CM

## 2021-11-22 DIAGNOSIS — K2100 Gastro-esophageal reflux disease with esophagitis, without bleeding: Secondary | ICD-10-CM

## 2021-11-22 DIAGNOSIS — E785 Hyperlipidemia, unspecified: Secondary | ICD-10-CM

## 2021-11-22 DIAGNOSIS — K860 Alcohol-induced chronic pancreatitis: Secondary | ICD-10-CM | POA: Diagnosis not present

## 2021-11-22 DIAGNOSIS — F17200 Nicotine dependence, unspecified, uncomplicated: Secondary | ICD-10-CM

## 2021-11-22 MED ORDER — OXYCODONE HCL 10 MG PO TABS: 5.0000 mg | ORAL_TABLET | Freq: Four times a day (QID) | ORAL | 0 refills | Status: AC | PRN

## 2021-11-22 MED ORDER — ACETAMINOPHEN 500 MG PO TABS: 500.0000 mg | ORAL_TABLET | Freq: Four times a day (QID) | ORAL | 0 refills | Status: DC | PRN

## 2021-11-22 MED ORDER — IBUPROFEN 400 MG PO TABS: 400.0000 mg | ORAL_TABLET | Freq: Four times a day (QID) | ORAL | 0 refills | Status: DC | PRN

## 2021-11-22 NOTE — Progress Notes (Signed)
o  [place] VISIT   Primary Care Provider [address], [address] [place], [place] [address] [phone]  Referring Provider Dr. Allen Norris  Patient Profile: Shaun Odonnell is a 56 y.o. male with a pmh significant for IDDM, hyperlipidemia, COPD, bipolar disorder, MDD/anxiety, prior PE, colon polyps (TAs), tobacco use disorder (decreasing use), alcohol use disorder (decreasing use), acute on chronic pancreatitis episode secondary to alcohol and formation of pancreatic duct stone/stricture, chronic pancreatic tail pseudocyst, chronic pain syndrome, GERD with esophagitis.  The patient presents to the [facility] for an evaluation and management of problem(s) noted below:  Problem List 1. Alcohol-induced chronic pancreatitis ([place])   2. Generalized abdominal pain   3. Pancreatic duct stones   4. Dilation of pancreatic duct   5. Pancreatic cyst   6. Acute on chronic pancreatitis (HCC)   7. Other chronic pain   8. Alcohol use disorder, severe, in early remission ([place])   9. Tobacco use disorder   10. Gastroesophageal reflux disease with esophagitis without hemorrhage     History of Present Illness Please see prior notes for full details of HPI.  Interval History The patient returns for follow-up.  At the end of October I performed an EUS and ERCP.  EUS showed evidence of pancreatic duct stone with PD dilation upstream.  Pancreatic cyst was sampled with results noted as below showing evidence of a pancreatic pseudocyst that was drained.  ERCP was attempted and deep seating of the pancreatic duct was not possible due to the pancreatic duct stone/stricture in the neck of the pancreas.  I performed a small pancreatic sphincterotomy but after extensive amount of time was unable to cannulate deeply the pancreatic duct.  After the procedure he was given short course of narcotic therapy to try to help keep the patient out of the hospital.  He was given express  distinction that medication was given to try to help prevent him from going to the hospital but would not be available recurrently in the future.  He called on multiple occasions with recurrent abdominal pain and asking for narcotic medications without having been received in the clinic and these were deferred.  I found out after his procedure from his family that he had continued to drink alcohol up until his procedure.  He saw his PCP who gave him a short course of narcotic therapy to try to get into this clinic visit and a chronic pain specialist referral was placed and is in process currently.  He has not followed up with his primary gastroenterologist, Dr. Allen Norris at [facility].  He does tell me that he has now been abstinent of alcohol for at least the last 6 weeks.  His tobacco use is also decreased and he is only using 2 to 3 cigarettes/day when previously he was doing multiple packs per day.  Today he is unaccompanied by his family (he is solely in the room today).  Most recent imaging was performed and showed that the pancreatic cyst had recurred his PD dilation is still present with some acute on chronic pancreatitis.  Patient states he is having significant pain 7-8 out of 10 currently.  He is still taking Creon at recommended dosing.  His bowel movements are normal.  He is not having any blood in stools.  He states that Tylenol and NSAIDs have not been helpful.  He is not on Mobic anymore per his notation even though it was a medication on his symptoms.  He is  not losing significant weight.  He states he just wants to feel better.  He has been on Jardiance in the past and is currently on Trulicity but has been on this for a while.  His A1c remains significantly elevated.  GI Review of Systems Positive as above including bloating at times Negative for odynophagia, dysphagia, alteration of bowel habits, melena, hematochezia   Review of Systems General: Denies fevers/chills/unintentional weight  loss Cardiovascular: Denies chest pain Pulmonary: Denies shortness of breath Gastroenterological: See HPI Genitourinary: Denies darkened urine  Hematological: Denies easy bruising/bleeding Dermatological: Denies jaundice Psychological: Mood is anxious and concerned that he will always be in pain   Medications Current Outpatient Medications  Medication Sig Dispense Refill   acetaminophen (TYLENOL) 500 MG tablet Take 1 tablet (500 mg total) by mouth every 6 (six) hours as needed. 30 tablet 0   albuterol (PROVENTIL) (2.5 MG/3ML) 0.083% nebulizer solution Take 3 mLs (2.5 mg total) by nebulization every 6 (six) hours as needed for wheezing or shortness of breath. 360 mL 5   albuterol (VENTOLIN HFA) 108 (90 Base) MCG/ACT inhaler INHALE 2 PUFFS BY MOUTH EVERY 6 HOURS ASNEEDED WHEEZING/ SHORTNESS OF BREATH 8.5 g 1   ALPRAZolam (XANAX) 0.5 MG tablet Take 0.5 mg by mouth 4 (four) times daily. Patient takes when wakes up(~0600)/ 1000/ 1600/ 2000     budesonide-formoterol (SYMBICORT) 80-4.5 MCG/ACT inhaler Inhale 2 puffs into the lungs in the morning and at bedtime. 1 each 12   carboxymethylcellulose (REFRESH PLUS) 0.5 % SOLN Place 2 drops into both eyes daily as needed (Dry eyes).     Dulaglutide (TRULICITY) 3 DX/8.3JA SOPN Inject 3 mg as directed once a week. 4 mL 2   empagliflozin (JARDIANCE) 25 MG TABS tablet Take 1 tablet (25 mg total) by mouth daily before breakfast. 30 tablet 2   fenofibrate (TRICOR) 145 MG tablet Take 1 tablet (145 mg total) by mouth daily. 30 tablet 2   fexofenadine (ALLEGRA ALLERGY) 180 MG tablet Take 1 tablet (180 mg total) by mouth daily. 30 tablet 1   glucose blood test strip Use as instructed 200 each 12   ibuprofen (ADVIL) 400 MG tablet Take 1 tablet (400 mg total) by mouth every 6 (six) hours as needed. 30 tablet 0   insulin glargine (LANTUS SOLOSTAR) 100 UNIT/ML Solostar Pen Inject 20 Units into the skin at bedtime. INJECT 10 UNITS SUBCUTANEOUSLY AT BEDTIME 15 mL 2    lipase/protease/amylase (CREON) 36000 UNITS CPEP capsule Take 2 capsules (72,000 Units total) by mouth 3 (three) times daily with meals. May also take 1 capsule (36,000 Units total) as needed (with snacks). 240 capsule 11   meloxicam (MOBIC) 15 MG tablet Take 1 tablet (15 mg total) by mouth daily. 30 tablet 0   OLANZapine (ZYPREXA) 20 MG tablet Take 20 mg by mouth at bedtime.     omeprazole (PRILOSEC) 40 MG capsule Take 1 capsule (40 mg total) by mouth daily. 30 capsule 6   ondansetron (ZOFRAN-ODT) 8 MG disintegrating tablet Take 1 tablet (8 mg total) by mouth every 8 (eight) hours as needed for nausea or vomiting. 20 tablet 0   OneTouch Delica Lancets 25K MISC USE TO CHECK FASTING SUGAR TWICE DAILY 100 each 1   oxyCODONE 10 MG TABS Take 0.5 tablets (5 mg total) by mouth every 6 (six) hours as needed for up to 5 days for severe pain or breakthrough pain. 20 tablet 0   sertraline (ZOLOFT) 100 MG tablet Take 100 mg by mouth daily.  ULTRACARE PEN NEEDLES 32G X 4 MM MISC USE ONCE DAILY WITH INSULIN 90 each 1   simvastatin (ZOCOR) 80 MG tablet TAKE 1 TABLET BY MOUTH AT BEDTIME 90 tablet 0   No current facility-administered medications for this visit.    Allergies Allergies  Allergen Reactions   Fentanyl Nausea And Vomiting   Morphine And Related Shortness Of Breath   Clonazepam Other (See Comments)    Dystonic Reaction   Other reaction(s): Other (See Comments) "Dystonic"; alprazolam is a home med "Dystonic"; alprazolam is a home med Dystonic Reaction   Pimozide Other (See Comments)    Dystonic Reaction  Other reaction(s): Other (See Comments) "Distonic" "Distonic" Dystonic Reaction   Trifluoperazine Other (See Comments)    Dystonic Reaction Other reaction(s): Other (See Comments) "Distonic" "Distonic" Dystonic reaction Dystonic Reaction   Azithromycin Other (See Comments)    Was told not to take Z pak due to his heart   Montelukast     headache, nausea, feeling a little  anxious, panic   Morphine Other (See Comments)    Other reaction(s): Other (See Comments) " I stop breathing"; can take Diluadid " I stop breathing"; can take Diluadid Cnnot tolerate d/t Drug Metabolism   Tramadol Nausea Only    Histories Past Medical History:  Diagnosis Date   Alcohol abuse 04/29/2019   Allergy    Anxiety    ARDS (adult respiratory distress syndrome) (HCC)    Bipolar disorder (HCC)    Cataract    Chronic kidney disease    per pt - no current problems   COPD (chronic obstructive pulmonary disease) (HCC)    chronic cough and wheezing   Depression    Diabetes mellitus without complication ([place])    type 2   Dizziness    medication related   Dyspnea    with exertion   Elevated lipids    Fatty liver    Hypercholesterolemia    Hypertension    per pt, no current prob-no meds   Pancreatitis    Pneumonia    in past   Pulmonary embolism ([place])    Schizoaffective disorder ([place])    Past Surgical History:  Procedure Laterality Date   BIOPSY  08/31/2021   Procedure: BIOPSY;  Surgeon: Irving Copas., MD;  Location: [place];  Service: Gastroenterology;;   CATARACT EXTRACTION W/PHACO Right 03/26/2018   Procedure: CATARACT EXTRACTION PHACO AND INTRAOCULAR LENS PLACEMENT ([place]) RIGHT BORDERLINE DIABETIC;  Surgeon: Leandrew Koyanagi, MD;  Location: [place];  Service: Ophthalmology;  Laterality: Right;   CATARACT EXTRACTION W/PHACO Left 04/23/2018   Procedure: CATARACT EXTRACTION PHACO AND INTRAOCULAR LENS PLACEMENT ([place])  LEFT BORDERLINE DIABETIC;  Surgeon: Leandrew Koyanagi, MD;  Location: [place];  Service: Ophthalmology;  Laterality: Left;   COLONOSCOPY     COLONOSCOPY WITH PROPOFOL N/A 08/21/2017   Procedure: COLONOSCOPY WITH PROPOFOL;  Surgeon: Manya Silvas, MD;  Location: [facility];  Service: Endoscopy;  Laterality: N/A;   ENDOSCOPIC RETROGRADE CHOLANGIOPANCREATOGRAPHY (ERCP) WITH PROPOFOL N/A 08/31/2021    Procedure: ENDOSCOPIC RETROGRADE CHOLANGIOPANCREATOGRAPHY (ERCP) WITH PROPOFOL;  Surgeon: Rush Landmark Telford Nab., MD;  Location: [place];  Service: Gastroenterology;  Laterality: N/A;   ESOPHAGOGASTRODUODENOSCOPY (EGD) WITH PROPOFOL N/A 08/31/2021   Procedure: ESOPHAGOGASTRODUODENOSCOPY (EGD) WITH PROPOFOL;  Surgeon: Rush Landmark Telford Nab., MD;  Location: [place];  Service: Gastroenterology;  Laterality: N/A;   EYE SURGERY     FINE NEEDLE ASPIRATION N/A 08/31/2021   Procedure: FINE NEEDLE ASPIRATION (FNA) LINEAR;  Surgeon: Irving Copas., MD;  Location: [facility];  Service: Gastroenterology;  Laterality: N/A;   HERNIA REPAIR     inguinal and umbilical   RIB RESECTION N/A 08/20/2019   Procedure: removal of xyphoid process;  Surgeon: Nestor Lewandowsky, MD;  Location: [facility];  Service: Thoracic;  Laterality: N/A;   SPHINCTEROTOMY  08/31/2021   Procedure: Joan Mayans;  Surgeon: Mansouraty, Telford Nab., MD;  Location: [place];  Service: Gastroenterology;;   UMBILICAL HERNIA REPAIR N/A 06/22/2019   Procedure: OPEN HERNIA REPAIR UMBILICAL ADULT;  Surgeon: Olean Ree, MD;  Location: [facility];  Service: General;  Laterality: N/A;   UPPER ESOPHAGEAL ENDOSCOPIC ULTRASOUND (EUS) N/A 08/31/2021   Procedure: UPPER ESOPHAGEAL ENDOSCOPIC ULTRASOUND (EUS);  Surgeon: Irving Copas., MD;  Location: [place];  Service: Gastroenterology;  Laterality: N/A;   UPPER GI ENDOSCOPY     Social History   Socioeconomic History   Marital status: Single    Spouse name: Not on file   Number of children: 0   Years of education: Not on file   Highest education level: Bachelor's degree (e.g., BA, AB, BS)  Occupational History   Occupation: disabled  Tobacco Use   Smoking status: Every Day    Packs/day: 1.50    Years: 34.00    Pack years: 51.00    Types: Cigarettes   Smokeless tobacco: Former    Types: Snuff    Quit date: 1990  Vaping Use   Vaping Use: Every day    Substances: Nicotine, Flavoring  Substance and Sexual Activity   Alcohol use: Yes    Alcohol/week: 6.0 standard drinks    Types: 6 Cans of beer per week    Comment: 3 beers 2x a week   Drug use: Not Currently   Sexual activity: Not Currently  Other Topics Concern   Not on file  Social History Narrative   Not on file   Social Determinants of Health   Financial Resource Strain: Low Risk    Difficulty of Paying Living Expenses: Not hard at all  Food Insecurity: No Food Insecurity   Worried About Charity fundraiser in the Last Year: Never true   Ran Out of Food in the Last Year: Never true  Transportation Needs: No Transportation Needs   Lack of Transportation (Medical): No   Lack of Transportation (Non-Medical): No  Physical Activity: Inactive   Days of Exercise per Week: 0 days   Minutes of Exercise per Session: 0 min  Stress: No Stress Concern Present   Feeling of Stress : Not at all  Social Connections: Moderately Isolated   Frequency of Communication with Friends and Family: More than three times a week   Frequency of Social Gatherings with Friends and Family: Three times a week   Attends Religious Services: More than 4 times per year   Active Member of Clubs or Organizations: No   Attends Archivist Meetings: Never   Marital Status: Never married  Human resources officer Violence: Not At Risk   Fear of Current or Ex-Partner: No   Emotionally Abused: No   Physically Abused: No   Sexually Abused: No   Family History  Problem Relation Age of Onset   Hyperlipidemia Mother    Hypertension Father    Diabetes Maternal Aunt    Dementia Maternal Grandmother    Heart disease Maternal Grandfather    Heart disease Paternal Grandmother    Stroke Paternal Grandfather    Diabetes Paternal Grandfather    Colon cancer Neg Hx    Esophageal  cancer Neg Hx    Inflammatory bowel disease Neg Hx    Liver disease Neg Hx    Pancreatic cancer Neg Hx    Stomach cancer Neg Hx     Rectal cancer Neg Hx    I have reviewed his medical, social, and family history in detail and updated the electronic medical record as necessary.    PHYSICAL EXAMINATION  BP 110/70    Pulse 65    Ht 5\' 8"  (1.727 m)    Wt 179 lb (81.2 kg)    BMI 27.22 kg/m  Wt Readings from Last 3 Encounters:  11/22/21 179 lb (81.2 kg)  11/11/21 173 lb (78.5 kg)  10/17/21 173 lb (78.5 kg)  GEN: NAD, appears stated age, doesn't appear chronically ill PSYCH: Cooperative, without pressured speech EYE: Conjunctivae pink, sclerae anicteric ENT: MMM CV: Nontachycardic RESP: No audible wheezing GI: NABS, soft, protuberant abdomen, TTP in midepigastrium radiating throughout the abdomen on palpation, with volitional guarding, but no rebound MSK/EXT: No lower extremity edema SKIN: No jaundice NEURO:  Alert & Oriented x 3, no focal deficits   REVIEW OF DATA  I reviewed the following data at the time of this encounter:  GI Procedures and Studies  10/22 EUS EGD Impression: - No gross lesions in esophagus proximally. LA Grade C esophagitis with no bleeding distally. - Z-line regular, 39 cm from the incisors. - Gastritis. Biopsied. - No gross lesions in the duodenal bulb, in the first portion of the duodenum and in the second portion of the duodenum. - Congested/bulging ampulla noted. EUS Impression: - Findings suggestive of a pancreatic stone were identified in the pancreatic head/genu region. - The pancreatic duct had a dilated endosonographic appearance and had a prominently branched endosonographic appearance in the pancreatic head and genu mostly (due to the stone noted) with mild prominence of the body and tail duct regions. - Pancreatic parenchymal abnormalities consisting of hyperechoic foci, lobularity and hyperechoic strands were noted in the entire pancreas. Consistent with stone in the PD with Chronic Pancreatitis. - A cystic lesion was seen in the pancreatic tail. Fluid was obtained from this  exam, and results are pending. However, the endosonographic appearance is suggestive of a chronic pancreatic pseudocyst vs IPMN. Fine needle aspiration for fluid performed. - No malignant-appearing lymph nodes were visualized in the celiac region (level 20), peripancreatic region and porta hepatis region.  CEA 134 ng/mL Amylase 33,091 units/L Cytopathology with acellular scant proteinaceous debris  10/22 ERCP - The major papilla appeared congested and bulging. - Difficult cannulation as noted above never led to deep seeding of the wire into the pancreas. - Moderate dilatation of the ventral pancreatic duct in the head/genu of the pancreas as a result of the noted single pancreatic stone. - A pancreatic sphincterotomy was performed but this did not improve success at deep cannulation. - Procedure completed unsuccessfully.  Laboratory Studies  Reviewed those in epic and care everywhere  Imaging Studies  January 2023 CTAP IMPRESSION: Mild acute on chronic pancreatitis. No evidence of pancreatic necrosis. Stable 3.8 cm pancreatic pseudocyst in pancreatic tail. Chronic splenic vein thrombosis. Decreased hepatic steatosis. Large stool burden noted; recommend clinical correlation for possible constipation. Aortic Atherosclerosis (ICD10-I70.0).   ASSESSMENT  Mr. Pavao is a 56 y.o. male with a pmh significant for IDDM, hyperlipidemia, COPD, bipolar disorder, MDD/anxiety, prior PE, colon polyps (TAs), tobacco use disorder (decreasing use), alcohol use disorder (decreasing use), acute on chronic pancreatitis episode secondary to alcohol and formation of pancreatic duct stone/stricture, chronic  pancreatic tail pseudocyst, chronic pain syndrome, GERD with esophagitis.  The patient is seen today for evaluation and management of:  1. Alcohol-induced chronic pancreatitis ([place])   2. Generalized abdominal pain   3. Pancreatic duct stones   4. Dilation of pancreatic duct   5. Pancreatic cyst    6. Acute on chronic pancreatitis (HCC)   7. Other chronic pain   8. Alcohol use disorder, severe, in early remission ([place])   9. Tobacco use disorder   10. Gastroesophageal reflux disease with esophagitis without hemorrhage    Today, the patient is hemodynamically stable.  Clinically, he continues to experience episodes of acute on chronic pancreatitis.  As noted above, attempt at accessing the pancreatic duct was unsuccessful at ERCP back in October.  I also had found out after the procedure that he was still drinking alcohol, and if I had not known that at the time, I likely would not have even attempted the ERCP at that point.  States he has now been free of all alcohol for 6 weeks.  He is also decreased his tobacco use.  I congratulated him on this achievement as it will hopefully prevent him from having more issues develop in the future.  He already has chronic pancreatitis.  I have we counseled him that a person who has chronic pancreatitis even if they did not have pancreatic duct stones in place is at risk of recurrent episodes of pancreatitis.  Because he also has pancreatic duct stones, plumbing issues could be leading to pancreatitis as well.  I think it is worth an attempt at one of the quaternary center to see if they may be able to access the pancreatic duct.  If they cannot, his anatomy may be amenable to surgical intervention such as Pe[place]ow fenestration.  He is not interested in surgical intervention with the possibility that things may not be absolutely effective but is amenable to another attempt at possible endoscopic therapy.  In my practice, chronic narcotic medications are not how I pursue my medical practice.  I have been very upfront with the patient previously at clinic and after his procedure that my role is not to be a pain specialist.  He has reasons to have chronic pain, unfortunately a result of his past life issues.  At this point, the rereferral to pain management as per his  PCP which is still currently pending per our notation in the chart and reaching out to the facility, as such that I will give him 5 days worth of medication but this is the last time that I am giving any pain medications for the patient unless other therapies or procedures are performed by me.  He understands this and will follow through with hopefully obtaining a pain management service.  I discussed the consideration of a quaternary center evaluation and he has decided to go to [facility] since they are closer to where he lives.  We will place a referral to Dr. Lysle Rubens or Dr. Brandon Melnick at [facility] advanced endoscopy.  The cyst that was aspirated returned, but is consistent with a pseudocyst.  It is not clear to me that this is a true pancreatic duct disruption since he has had episodes of pancreatitis since his last EUS.  He needs to continue to try to decrease his tobacco use as he has done.  He must stay alcohol free as he has done.  I am hopeful that another advanced endoscopist will be able to get into the PD but time will  tell.   In regards to other medications, there is potential risk for patients who have had or do have pancreatitis/chronic pancreatitis history that being on Trulicity as well as Jardiance may be a risk factor for recurrent episodes of pancreatitis.  He has been on Trulicity for a while and he has other reasons for recurrent pancreatitis.  If his PCP or his upcoming endocrinologist feels like they have the opportunity to transition off of Trulicity and not use Jardiance in the future that may be reasonable but I will leave that up to them since he has another reason for his recurrent bouts of pancreatitis.    All patient questions were answered to the best of my ability, and the patient agrees to the aforementioned plan of action with follow-up as indicated.   PLAN  Short 5-day course of oxycodone 5 mg every 6 hour (no additional refills to be given by me in the future) Ibuprofen 400 mg every 6 hours  staggering with Tylenol 500 mg every 6 hours (I am willing to prescribe this if necessary however) Follow-up in pain clinic as per referral that has been placed by PCP for definitive management of chronic pain syndrome Continue on your alcohol cessation journey Continue to decrease tobacco use as you have done Continue Creon 72,000 units with each meal and 36,000 units with each snack Quaternary center referral to [facility] for reattempt at ERCP PD cannulation and stenting Surgical referral for Peustow if unsuccessful was discussed but deferred by patient Consider Trulicity cessation as there may be increased risk for pancreatitis episodes in patients who have history of prior pancreatitis (although patient has known reason for recurrent pancreatitis) Continue PPI once daily at minimum in history of prior GERD with esophagitis Diabetes control as per PCP and upcoming endocrinology referral   Orders Placed This Encounter  Procedures   Ambulatory referral to Gastroenterology    New Prescriptions   ACETAMINOPHEN (TYLENOL) 500 MG TABLET    Take 1 tablet (500 mg total) by mouth every 6 (six) hours as needed.   IBUPROFEN (ADVIL) 400 MG TABLET    Take 1 tablet (400 mg total) by mouth every 6 (six) hours as needed.   OXYCODONE 10 MG TABS    Take 0.5 tablets (5 mg total) by mouth every 6 (six) hours as needed for up to 5 days for severe pain or breakthrough pain.   Modified Medications   Modified Medication Previous Medication   SIMVASTATIN (ZOCOR) 80 MG TABLET simvastatin (ZOCOR) 80 MG tablet      TAKE 1 TABLET BY MOUTH AT BEDTIME    TAKE 1 TABLET BY MOUTH AT BEDTIME    Planned Follow Up Return in about 3 months (around 02/20/2022).   Total Time in Face-to-Face and in Coordination of Care for patient including independent/personal interpretation/review of prior testing, medical history, examination, medication adjustment, communicating results with the patient directly, and documentation with the  EHR is 45 minutes.   Justice Britain, MD [facility] Advanced [facility] Office # [phone]

## 2021-11-22 NOTE — Patient Instructions (Addendum)
We have sent the following medications to your pharmacy for you to pick up at your convenience: Oxycodone   Take Ibuprofen 400mg  every 6 hours as needed for pain.Three hours later Alternate Tylenol 500mg  every 6 hours as needed for pain.     USE OXYCODONE ONLY if the ibuprofen and tylenol is not helping. OUR OFFICE WILL NO LONGER PROVIDE PAIN MEDICATION after today.   You have a open referral to [facility] for pain management that is currently in review .Their office will contact you once a decision has been made. The number to their office is [phone].  You have also been referred to Dr. Gayleen Orem at [facility]. His office will contact you to schedule an appointment. The phone number for his office is [phone], if you have not heard from their office in 1-2 weeks, please contact their office.   Our office will contact you to schedule 3 month follow-up when schedule is available.  Thank you for choosing me and [facility].  Dr. Rush Landmark .

## 2021-11-22 NOTE — Telephone Encounter (Signed)
Requested Prescriptions  Pending Prescriptions Disp Refills   simvastatin (ZOCOR) 80 MG tablet [Pharmacy Med Name: SIMVASTATIN 80 MG TAB] 90 tablet 1    Sig: TAKE 1 TABLET BY MOUTH AT BEDTIME     Cardiovascular:  Antilipid - Statins Failed - 11/22/2021 11:34 AM      Failed - Total Cholesterol in normal range and within 360 days    Cholesterol, Total  Date Value Ref Range Status  10/18/2021 247 (H) 100 - 199 mg/dL Final   Cholesterol  Date Value Ref Range Status  08/03/2014 140 0 - 200 mg/dL Final         Failed - LDL in normal range and within 360 days    Ldl Cholesterol, Calc  Date Value Ref Range Status  08/03/2014 57 0 - 100 mg/dL Final   LDL Chol Calc (NIH)  Date Value Ref Range Status  10/18/2021 125 (H) 0 - 99 mg/dL Final         Failed - Triglycerides in normal range and within 360 days    Triglycerides  Date Value Ref Range Status  10/18/2021 455 (H) 0 - 149 mg/dL Final  08/03/2014 241 (H) 0 - 200 mg/dL Final         Passed - HDL in normal range and within 360 days    HDL Cholesterol  Date Value Ref Range Status  08/03/2014 35 (L) 40 - 60 mg/dL Final   HDL  Date Value Ref Range Status  10/18/2021 41 >39 mg/dL Final         Passed - Patient is not pregnant      Passed - Valid encounter within last 12 months    Recent Outpatient Visits          1 week ago Alcohol-induced acute pancreatitis, unspecified complication status   [facility] Vigg, Avanti, MD   1 month ago Acute non-recurrent sinusitis, unspecified location   [facility] Vigg, Avanti, MD   1 month ago [employer], Avanti, MD   2 months ago Type 2 diabetes mellitus with hyperglycemia, without long-term current use of insulin ([place])   [facility], Dionne Bucy, MD   2 months ago Type 2 diabetes mellitus with hyperglycemia, with long-term current use of insulin ([place])   [place], Avanti, MD       Future Appointments            In 1 week Vigg, Avanti, MD [facility] [name]   In 1 week Chesley Mires, MD [place]

## 2021-11-22 NOTE — Telephone Encounter (Signed)
Patient called said he forgot to mention one of the medications that he is on which is Lunesta 2 mg at bedtime.

## 2021-11-24 ENCOUNTER — Ambulatory Visit: Payer: Self-pay

## 2021-11-24 NOTE — Telephone Encounter (Signed)
°  Chief Complaint: earwax buildup Symptoms: L ear fullness, decreased hearing Frequency: 4 days Pertinent Negatives: Patient denies pain or fever Disposition: [] ED /[] Urgent Care (no appt availability in office) / [] Appointment(In office/virtual)/ []  [place] [facility]/ [x] Home Care/ [] Refused Recommended Disposition /[] [place] [facility]/ []  Follow-up with PCP Additional Notes: Pt will follow care advice but wants Dr. Neomia Dear to be aware and see if he aint no better by Monday, he will call back     Summary: Ear wax build up   Pt is experiencing hearing complications and believes he has packed wax into his ear. He says this has been happening for 4 days, getting progressively worse.  Best contact: [phone]   Requesting a Rx to have the wax dissolved.      Reason for Disposition  [1] Earwax problem AND [2] no improvement using CARE ADVICE  Answer Assessment - Initial Assessment Questions 1. LOCATION: "Which ear is involved?"      L ear  2. SYMPTOMS: "What are the main symptoms?" (e.g., fullness, decreased hearing, itching, discomfort)     Fullness, decreased hearing 3. ONSET: "When did the  sx  start?"     4 days 4. PAIN: "Is there any earache?" "How bad is it?"  (Scale 1-10; or mild, moderate, severe)     NO 5. OBJECTS: "Do you use cotton swabs (Q-tips) in your ear?" "Have you put anything else in your ear?"     yes 6. EARWAX HISTORY: "Have you had problems with earwax before?" If Yes, ask: "What did you do the last time?"     yes  Protocols used: Earwax-A-AH

## 2021-11-25 ENCOUNTER — Encounter: Payer: Self-pay | Admitting: Gastroenterology

## 2021-11-25 DIAGNOSIS — K2100 Gastro-esophageal reflux disease with esophagitis, without bleeding: Secondary | ICD-10-CM | POA: Insufficient documentation

## 2021-11-25 DIAGNOSIS — K859 Acute pancreatitis without necrosis or infection, unspecified: Secondary | ICD-10-CM | POA: Insufficient documentation

## 2021-11-25 DIAGNOSIS — G8929 Other chronic pain: Secondary | ICD-10-CM | POA: Insufficient documentation

## 2021-11-27 ENCOUNTER — Encounter: Payer: Self-pay | Admitting: Gastroenterology

## 2021-11-28 ENCOUNTER — Other Ambulatory Visit: Payer: Commercial Managed Care - HMO

## 2021-11-28 NOTE — Telephone Encounter (Signed)
Patty, Can you please check in with the referral to [facility] advanced endoscopy to ensure that they have received it for this patient to undergo repeat ERCP attempt for pancreas duct stone and pancreas duct stricture treatment? Can you also check with the plain clinic referral that had been placed previously to see if they are still working on it. You can let the patient know what you hear in regards to where things stand with those referrals and let him know that we certainly want him to be doing well but we are tied in regards to what is available at other facilities and other groups in regards to clinic appointments and available referrals. Thanks. GM

## 2021-11-28 NOTE — Telephone Encounter (Signed)
Rovonda thank you

## 2021-11-28 NOTE — Telephone Encounter (Signed)
Patty, Can you please check in with the referral to [facility] advanced endoscopy to ensure that they have received it for this patient to undergo repeat ERCP attempt for pancreas duct stone and pancreas duct stricture treatment? Can you also check with the plain clinic referral that had been placed previously to see if they are still working on it. You can let the patient know what you hear in regards to where things stand with those referrals. Thanks. GM

## 2021-11-28 NOTE — Telephone Encounter (Signed)
Rovonda have you heard anything from [facility] about this pt referral? Did you make a referral to pain clinic?  I cant see when he asked for that or if it was done.

## 2021-11-29 ENCOUNTER — Ambulatory Visit: Payer: Commercial Managed Care - HMO | Admitting: Internal Medicine

## 2021-11-29 ENCOUNTER — Other Ambulatory Visit: Payer: Medicare Other

## 2021-11-29 ENCOUNTER — Ambulatory Visit: Payer: Self-pay

## 2021-11-29 ENCOUNTER — Other Ambulatory Visit: Payer: Self-pay

## 2021-11-29 NOTE — Telephone Encounter (Signed)
°  Chief Complaint: bloody drainage from L ear Symptoms: wax build up and digging out with qtip Frequency: todays Pertinent Negatives: Patient denies ear pain Disposition: [] ED /[] Urgent Care (no appt availability in office) / [x] Appointment(In office/virtual)/ []  [place] [facility]/ [] Home Care/ [] Refused Recommended Disposition /[] [place] [facility]/ []  Follow-up with PCP Additional Notes: Pt already scheduled for appt 11/30/21 at 1420. Pt has been using peroxide and used abx gtts he had from years ago. Hasnt tried debrox ear gtts as advised last NT call.    Reason for Disposition  Bloody discharge or unexplained bleeding from ear canal  Answer Assessment - Initial Assessment Questions 1. LOCATION: "Which ear is involved?"     L ear  2. ONSET: "When did the ear start hurting"      Several days ago 3. SEVERITY: "How bad is the pain?"  (Scale 1-10; mild, moderate or severe)   - MILD (1-3): doesn't interfere with normal activities    - MODERATE (4-7): interferes with normal activities or awakens from sleep    - SEVERE (8-10): excruciating pain, unable to do any normal activities      moderate 4. URI SYMPTOMS: "Do you have a runny nose or cough?"     Not really 5. FEVER: "Do you have a fever?" If Yes, ask: "What is your temperature, how was it measured, and when did it start?"     No 6. CAUSE: "Have you been swimming recently?", "How often do you use Q-TIPS?", "Have you had any recent air travel or scuba diving?"     Used qtips to dig wax out and now has bloody drainage 7. OTHER SYMPTOMS: "Do you have any other symptoms?" (e.g., headache, stiff neck, dizziness, vomiting, runny nose, decreased hearing)     Overall fatigue  Protocols used: Earache-A-AH

## 2021-11-30 ENCOUNTER — Ambulatory Visit (INDEPENDENT_AMBULATORY_CARE_PROVIDER_SITE_OTHER): Payer: Medicare Other | Admitting: Nurse Practitioner

## 2021-11-30 ENCOUNTER — Other Ambulatory Visit: Payer: Self-pay

## 2021-11-30 ENCOUNTER — Encounter: Payer: Self-pay | Admitting: Nurse Practitioner

## 2021-11-30 VITALS — BP 106/68 | HR 63 | Temp 98.4°F | Ht 67.99 in | Wt 182.0 lb

## 2021-11-30 DIAGNOSIS — H6593 Unspecified nonsuppurative otitis media, bilateral: Secondary | ICD-10-CM | POA: Diagnosis not present

## 2021-11-30 MED ORDER — METHYLPREDNISOLONE 4 MG PO TBPK: ORAL_TABLET | ORAL | 0 refills | Status: DC

## 2021-11-30 NOTE — Progress Notes (Signed)
BP 106/68    Pulse 63    Temp 98.4 F (36.9 C) (Oral)    Ht 5' 7.99" (1.727 m)    Wt 182 lb (82.6 kg)    SpO2 96%    BMI 27.68 kg/m    Subjective:    Patient ID: Shaun Odonnell, male    DOB: 11/20/1965, 56 y.o.   MRN: [id number]  HPI: Shaun Odonnell is a 56 y.o. male  Chief Complaint  Patient presents with   Ear Pain    Left ear pain since Monday. He states that it felt clogged so he has been using peroxide in his ear and cleaning with a Qtip, the last tip he dug in his ear there was blood on the end.    EAR PAIN Duration: days Involved ear(s): left Severity:  4/10  Quality:  sharp Fever: no Otorrhea: yes- blood on q tip Upper respiratory infection symptoms: no Pruritus: yes Hearing loss: yes Water immersion no Using Q-tips: yes Recurrent otitis media: no Status: stable Treatments attempted:  Peroxide and Qtips. Hasn't taken any medications.  Relevant past medical, surgical, family and social history reviewed and updated as indicated. Interim medical history since our last visit reviewed. Allergies and medications reviewed and updated.  Review of Systems  Per HPI unless specifically indicated above     Objective:    BP 106/68    Pulse 63    Temp 98.4 F (36.9 C) (Oral)    Ht 5' 7.99" (1.727 m)    Wt 182 lb (82.6 kg)    SpO2 96%    BMI 27.68 kg/m   Wt Readings from Last 3 Encounters:  11/30/21 182 lb (82.6 kg)  11/22/21 179 lb (81.2 kg)  11/11/21 173 lb (78.5 kg)    Physical Exam Vitals and nursing note reviewed.  Constitutional:      General: He is not in acute distress.    Appearance: Normal appearance. He is not ill-appearing, toxic-appearing or diaphoretic.  HENT:     Head: Normocephalic.     Right Ear: External ear normal. A middle ear effusion is present.     Left Ear: External ear normal. Drainage (dried blood in canal) present. A middle ear effusion is present.     Nose: Nose normal. No congestion or rhinorrhea.     Mouth/Throat:     Mouth:  Mucous membranes are moist.  Eyes:     General:        Right eye: No discharge.        Left eye: No discharge.     Extraocular Movements: Extraocular movements intact.     Conjunctiva/sclera: Conjunctivae normal.     Pupils: Pupils are equal, round, and reactive to light.  Cardiovascular:     Rate and Rhythm: Normal rate and regular rhythm.     Heart sounds: No murmur heard. Pulmonary:     Effort: Pulmonary effort is normal. No respiratory distress.     Breath sounds: Normal breath sounds. No wheezing, rhonchi or rales.  Abdominal:     General: Abdomen is flat. Bowel sounds are normal.  Musculoskeletal:     Cervical back: Normal range of motion and neck supple.  Skin:    General: Skin is warm and dry.     Capillary Refill: Capillary refill takes less than 2 seconds.  Neurological:     General: No focal deficit present.     Mental Status: He is alert and oriented to person, place, and time.  Psychiatric:        Mood and Affect: Mood normal.        Behavior: Behavior normal.        Thought Content: Thought content normal.        Judgment: Judgment normal.    Results for orders placed or performed during the hospital encounter of 11/12/21  Lipase, blood  Result Value Ref Range   Lipase 244 (H) 11 - 51 U/L  Comprehensive metabolic panel  Result Value Ref Range   Sodium 134 (L) 135 - 145 mmol/L   Potassium 3.9 3.5 - 5.1 mmol/L   Chloride 96 (L) 98 - 111 mmol/L   CO2 29 22 - 32 mmol/L   Glucose, Bld 186 (H) 70 - 99 mg/dL   BUN 12 6 - 20 mg/dL   Creatinine, Ser 0.64 0.61 - 1.24 mg/dL   Calcium 9.3 8.9 - 10.3 mg/dL   Total Protein 7.7 6.5 - 8.1 g/dL   Albumin 4.5 3.5 - 5.0 g/dL   AST 17 15 - 41 U/L   ALT 14 0 - 44 U/L   Alkaline Phosphatase 92 38 - 126 U/L   Total Bilirubin 0.5 0.3 - 1.2 mg/dL   GFR, Estimated >60 >60 mL/min   Anion gap 9 5 - 15  CBC  Result Value Ref Range   WBC 11.8 (H) 4.0 - 10.5 K/uL   RBC 5.03 4.22 - 5.81 MIL/uL   Hemoglobin 16.2 13.0 - 17.0 g/dL    HCT 47.2 39.0 - 52.0 %   MCV 93.8 80.0 - 100.0 fL   MCH 32.2 26.0 - 34.0 pg   MCHC 34.3 30.0 - 36.0 g/dL   RDW 12.4 11.5 - 15.5 %   Platelets 217 150 - 400 K/uL   nRBC 0.0 0.0 - 0.2 %  Troponin I (High Sensitivity)  Result Value Ref Range   Troponin I (High Sensitivity) 5 <18 ng/L      Assessment & Plan:   Problem List Items Addressed This Visit   None Visit Diagnoses     Fluid level behind tympanic membrane of both ears    -  Primary   Steroids dose pack sent to the pharmacy. Recommend OTC zyrtec to help with congestion. Letter written for work. FU if symptoms do not improve.        Follow up plan: Return if symptoms worsen or fail to improve.

## 2021-12-01 ENCOUNTER — Ambulatory Visit: Payer: Medicare Other | Admitting: Nurse Practitioner

## 2021-12-04 ENCOUNTER — Other Ambulatory Visit: Payer: Self-pay

## 2021-12-04 ENCOUNTER — Encounter: Payer: Self-pay | Admitting: Pulmonary Disease

## 2021-12-04 ENCOUNTER — Ambulatory Visit (INDEPENDENT_AMBULATORY_CARE_PROVIDER_SITE_OTHER): Payer: Medicare Other | Admitting: Pulmonary Disease

## 2021-12-04 VITALS — BP 130/70 | HR 77 | Temp 98.2°F | Ht 68.0 in | Wt 176.0 lb

## 2021-12-04 DIAGNOSIS — J4540 Moderate persistent asthma, uncomplicated: Secondary | ICD-10-CM | POA: Diagnosis not present

## 2021-12-04 DIAGNOSIS — F17200 Nicotine dependence, unspecified, uncomplicated: Secondary | ICD-10-CM

## 2021-12-04 DIAGNOSIS — J432 Centrilobular emphysema: Secondary | ICD-10-CM | POA: Diagnosis not present

## 2021-12-04 NOTE — Patient Instructions (Signed)
Symbicort two puffs in the morning and two puffs in the evening, and rinse your mouth after each use.  Albuterol two puffs every 6 hours as needed for cough, wheeze, chest congestion, or shortness of breath.  Follow up in 6 months.

## 2021-12-04 NOTE — Progress Notes (Signed)
[facility]  Chief Complaint  Patient presents with   Follow-up    Constitutional:  BP 130/70 (BP Location: Left Arm, Patient Position: Sitting, Cuff Size: Normal)    Pulse 77    Temp 98.2 F (36.8 C) (Oral)    Ht 5\' 8"  (1.727 m)    Wt 176 lb (79.8 kg)    SpO2 98%    BMI 26.76 kg/m   Past Medical History:  ETOH, Allergies, Anxiety, Pancreatitis complicated by ARDS and PE in 2003, Bipolar, DM, Cataract, Depression, HLD, HTN, Schizoaffective  Past Surgical History:  He  has a past surgical history that includes Colonoscopy with propofol (N/A, 08/21/2017); Cataract extraction w/PHACO (Right, 03/26/2018); Cataract extraction w/PHACO (Left, 04/23/2018); Eye surgery; Umbilical hernia repair (N/A, 06/22/2019); Upper gi endoscopy; Hernia repair; Rib resection (N/A, 08/20/2019); Colonoscopy; Endoscopic retrograde cholangiopancreatography (ercp) with propofol (N/A, 08/31/2021); Upper esophageal endoscopic ultrasound (eus) (N/A, 08/31/2021); Fine needle aspiration (N/A, 08/31/2021); biopsy (08/31/2021); Sphincterotomy (08/31/2021); and Esophagogastroduodenoscopy (egd) with propofol (N/A, 08/31/2021).  Brief Summary:  Shaun Odonnell is a 56 y.o. male smoker with cough.       Subjective:   I last saw him in July 2022.  PFT from August showed borderline obstruction, positive bronchodilator response, and mild diffusion defect.  Chest xray from 10/07/21 showed Lt base scarring.  He is followed by GI for chronic alcoholic pancreatitis with pseudocyst.  Has occasional cough and sputum.  Not having as much wheeze.  Sleeping okay.    Down to 1/2 ppd cigarettes per day.  Starting using vaps about a month ago and this has helped him cut down on tobacco cigarettes.  He plans to stop vaping once he stop cigarettes.  Physical Exam:   Appearance - well kempt   ENMT - no sinus tenderness, no oral exudate, no LAN, Mallampati 3 airway, no  stridor  Respiratory - equal breath sounds bilaterally, no wheezing or rales  CV - s1s2 regular rate and rhythm, no murmurs  Ext - no clubbing, no edema  Skin - no rashes  Psych - normal mood and affect    Pulmonary testing:  PFT 06/08/21 >> 2.58 (71%), FEV1% 78, TLC 4.82 (73%), DLCO 73%, +BD  Chest Imaging:  CT angio chest 09/25/16 >> apical emphysema, fatty liver  Cardiac Tests:  Echo 01/04/15 >> EF 55%, mild MR  Social History:  He  reports that he has been smoking cigarettes. He has a 68.00 pack-year smoking history. He quit smokeless tobacco use about 33 years ago.  His smokeless tobacco use included snuff. He reports current alcohol use of about 6.0 standard drinks per week. He reports that he does not currently use drugs.  Family History:  His family history includes Dementia in his maternal grandmother; Diabetes in his maternal aunt and paternal grandfather; Heart disease in his maternal grandfather and paternal grandmother; Hyperlipidemia in his mother; Hypertension in his father; Stroke in his paternal grandfather.     Assessment/Plan:   Chronic cough. - from asthma and emphysema - spiriva was ineffective and breo made him feel anxious - continue symbicort 80 two puffs bid - prn albuterol - discussed roles for his different inhalers  Tobacco abuse. - he plans to gradually quit and is using vaps as a bridge to assist with this  Chronic alcoholic pancreatitis. - followed by Dr. Justice Britain with [facility]  Time Spent Involved in Patient Care on Day of Examination:  36 minutes  Follow up:   Patient  Instructions  Symbicort two puffs in the morning and two puffs in the evening, and rinse your mouth after each use.  Albuterol two puffs every 6 hours as needed for cough, wheeze, chest congestion, or shortness of breath.  Follow up in 6 months.  Medication List:   Allergies as of 12/04/2021       Reactions   Fentanyl Nausea And  Vomiting   Morphine And Related Shortness Of Breath   Clonazepam Other (See Comments)   Dystonic Reaction Other reaction(s): Other (See Comments) "Dystonic"; alprazolam is a home med "Dystonic"; alprazolam is a home med Dystonic Reaction   Pimozide Other (See Comments)   Dystonic Reaction Other reaction(s): Other (See Comments) "Distonic" "Distonic" Dystonic Reaction   Trifluoperazine Other (See Comments)   Dystonic Reaction Other reaction(s): Other (See Comments) "Distonic" "Distonic" Dystonic reaction Dystonic Reaction   Azithromycin Other (See Comments)   Was told not to take Z pak due to his heart   Montelukast    headache, nausea, feeling a little anxious, panic   Morphine Other (See Comments)   Other reaction(s): Other (See Comments) " I stop breathing"; can take Diluadid " I stop breathing"; can take Diluadid Cnnot tolerate d/t Drug Metabolism   Tramadol Nausea Only        Medication List        Accurate as of December 04, 2021 10:51 AM. If you have any questions, ask your nurse or doctor.          STOP taking these medications    fexofenadine 180 MG tablet Commonly known as: Allegra Allergy Stopped by: Chesley Mires, MD   meloxicam 15 MG tablet Commonly known as: MOBIC Stopped by: Chesley Mires, MD       TAKE these medications    acetaminophen 500 MG tablet Commonly known as: TYLENOL Take 1 tablet (500 mg total) by mouth every 6 (six) hours as needed.   albuterol 108 (90 Base) MCG/ACT inhaler Commonly known as: VENTOLIN HFA INHALE 2 PUFFS BY MOUTH EVERY 6 HOURS ASNEEDED WHEEZING/ SHORTNESS OF BREATH   albuterol (2.5 MG/3ML) 0.083% nebulizer solution Commonly known as: PROVENTIL Take 3 mLs (2.5 mg total) by nebulization every 6 (six) hours as needed for wheezing or shortness of breath.   ALPRAZolam 0.5 MG tablet Commonly known as: XANAX Take 0.5 mg by mouth 4 (four) times daily. Patient takes when wakes up(~0600)/ 1000/ 1600/ 2000    budesonide-formoterol 80-4.5 MCG/ACT inhaler Commonly known as: Symbicort Inhale 2 puffs into the lungs in the morning and at bedtime.   carboxymethylcellulose 0.5 % Soln Commonly known as: REFRESH PLUS Place 2 drops into both eyes daily as needed (Dry eyes).   empagliflozin 25 MG Tabs tablet Commonly known as: Jardiance Take 1 tablet (25 mg total) by mouth daily before breakfast.   eszopiclone 2 MG Tabs tablet Commonly known as: LUNESTA Take 2 mg by mouth at bedtime as needed.   fenofibrate 145 MG tablet Commonly known as: Tricor Take 1 tablet (145 mg total) by mouth daily.   glucose blood test strip Use as instructed   ibuprofen 400 MG tablet Commonly known as: ADVIL Take 1 tablet (400 mg total) by mouth every 6 (six) hours as needed.   Lantus SoloStar 100 UNIT/ML Solostar Pen Generic drug: insulin glargine Inject 20 Units into the skin at bedtime. INJECT 10 UNITS SUBCUTANEOUSLY AT BEDTIME   lipase/protease/amylase 36000 UNITS Cpep capsule Commonly known as: Creon Take 2 capsules (72,000 Units total) by mouth 3 (three) times daily  with meals. May also take 1 capsule (36,000 Units total) as needed (with snacks).   methylPREDNISolone 4 MG Tbpk tablet Commonly known as: MEDROL DOSEPAK Take as directed   OLANZapine 20 MG tablet Commonly known as: ZYPREXA Take 20 mg by mouth at bedtime.   omeprazole 40 MG capsule Commonly known as: PRILOSEC Take 1 capsule (40 mg total) by mouth daily.   ondansetron 8 MG disintegrating tablet Commonly known as: ZOFRAN-ODT Take 1 tablet (8 mg total) by mouth every 8 (eight) hours as needed for nausea or vomiting.   OneTouch Delica Lancets 52C Misc USE TO CHECK FASTING SUGAR TWICE DAILY   sertraline 100 MG tablet Commonly known as: ZOLOFT Take 100 mg by mouth daily.   simvastatin 80 MG tablet Commonly known as: ZOCOR TAKE 1 TABLET BY MOUTH AT BEDTIME   Trulicity 3 EY/2.2VV Sopn Generic drug: Dulaglutide Inject 3 mg as  directed once a week.   Ultracare Pen Needles 32G X 4 MM Misc Generic drug: Insulin Pen Needle USE ONCE DAILY WITH INSULIN        Signature:  Chesley Mires, MD [place] Pager - [phone] 12/04/2021, 10:51 AM

## 2021-12-05 ENCOUNTER — Ambulatory Visit: Payer: Medicare Other | Admitting: Endocrinology

## 2021-12-07 ENCOUNTER — Other Ambulatory Visit: Payer: Self-pay

## 2021-12-07 ENCOUNTER — Encounter: Payer: Self-pay | Admitting: Gastroenterology

## 2021-12-07 DIAGNOSIS — Z86010 Personal history of colonic polyps: Secondary | ICD-10-CM

## 2021-12-07 MED ORDER — PEG 3350-KCL-NA BICARB-NACL 420 G PO SOLR: 4000.0000 mL | Freq: Once | ORAL | 0 refills | Status: AC

## 2021-12-07 NOTE — Progress Notes (Signed)
Gastroenterology Pre-Procedure Review  Request Date: 12/15/2021 Requesting Physician: Dr. Allen Norris  PATIENT REVIEW QUESTIONS: The patient responded to the following health history questions as indicated:    1. Are you having any GI issues? no 2. Do you have a personal history of Polyps? yes (last colonoscopy) 3. Do you have a family history of Colon Cancer or Polyps? yes (polyps) 4. Diabetes Mellitus? yes (type 2) 5. Joint replacements in the past 12 months?no 6. Major health problems in the past 3 months?no 7. Any artificial heart valves, MVP, or defibrillator?no    MEDICATIONS & ALLERGIES:    Patient reports the following regarding taking any anticoagulation/antiplatelet therapy:   Plavix, Coumadin, Eliquis, Xarelto, Lovenox, Pradaxa, Brilinta, or Effient? no Aspirin? no  Patient confirms/reports the following medications:  Current Outpatient Medications  Medication Sig Dispense Refill   acetaminophen (TYLENOL) 500 MG tablet Take 1 tablet (500 mg total) by mouth every 6 (six) hours as needed. 30 tablet 0   albuterol (PROVENTIL) (2.5 MG/3ML) 0.083% nebulizer solution Take 3 mLs (2.5 mg total) by nebulization every 6 (six) hours as needed for wheezing or shortness of breath. 360 mL 5   albuterol (VENTOLIN HFA) 108 (90 Base) MCG/ACT inhaler INHALE 2 PUFFS BY MOUTH EVERY 6 HOURS ASNEEDED WHEEZING/ SHORTNESS OF BREATH 8.5 g 1   ALPRAZolam (XANAX) 0.5 MG tablet Take 0.5 mg by mouth 4 (four) times daily. Patient takes when wakes up(~0600)/ 1000/ 1600/ 2000     budesonide-formoterol (SYMBICORT) 80-4.5 MCG/ACT inhaler Inhale 2 puffs into the lungs in the morning and at bedtime. 1 each 12   carboxymethylcellulose (REFRESH PLUS) 0.5 % SOLN Place 2 drops into both eyes daily as needed (Dry eyes).     Dulaglutide (TRULICITY) 3 UR/4.2HC SOPN Inject 3 mg as directed once a week. 4 mL 2   empagliflozin (JARDIANCE) 25 MG TABS tablet Take 1 tablet (25 mg total) by mouth daily before breakfast. 30 tablet 2    eszopiclone (LUNESTA) 2 MG TABS tablet Take 2 mg by mouth at bedtime as needed.     fenofibrate (TRICOR) 145 MG tablet Take 1 tablet (145 mg total) by mouth daily. 30 tablet 2   glucose blood test strip Use as instructed 200 each 12   ibuprofen (ADVIL) 400 MG tablet Take 1 tablet (400 mg total) by mouth every 6 (six) hours as needed. 30 tablet 0   insulin glargine (LANTUS SOLOSTAR) 100 UNIT/ML Solostar Pen Inject 20 Units into the skin at bedtime. INJECT 10 UNITS SUBCUTANEOUSLY AT BEDTIME 15 mL 2   lipase/protease/amylase (CREON) 36000 UNITS CPEP capsule Take 2 capsules (72,000 Units total) by mouth 3 (three) times daily with meals. May also take 1 capsule (36,000 Units total) as needed (with snacks). 240 capsule 11   methylPREDNISolone (MEDROL DOSEPAK) 4 MG TBPK tablet Take as directed 21 tablet 0   OLANZapine (ZYPREXA) 20 MG tablet Take 20 mg by mouth at bedtime.     omeprazole (PRILOSEC) 40 MG capsule Take 1 capsule (40 mg total) by mouth daily. 30 capsule 6   ondansetron (ZOFRAN-ODT) 8 MG disintegrating tablet Take 1 tablet (8 mg total) by mouth every 8 (eight) hours as needed for nausea or vomiting. 20 tablet 0   OneTouch Delica Lancets 62B MISC USE TO CHECK FASTING SUGAR TWICE DAILY 100 each 1   sertraline (ZOLOFT) 100 MG tablet Take 100 mg by mouth daily.      simvastatin (ZOCOR) 80 MG tablet TAKE 1 TABLET BY MOUTH AT BEDTIME 90 tablet 0  ULTRACARE PEN NEEDLES 32G X 4 MM MISC USE ONCE DAILY WITH INSULIN 90 each 1   No current facility-administered medications for this visit.    Patient confirms/reports the following allergies:  Allergies  Allergen Reactions   Fentanyl Nausea And Vomiting   Morphine And Related Shortness Of Breath   Clonazepam Other (See Comments)    Dystonic Reaction   Other reaction(s): Other (See Comments) "Dystonic"; alprazolam is a home med "Dystonic"; alprazolam is a home med Dystonic Reaction   Pimozide Other (See Comments)    Dystonic  Reaction  Other reaction(s): Other (See Comments) "Distonic" "Distonic" Dystonic Reaction   Trifluoperazine Other (See Comments)    Dystonic Reaction Other reaction(s): Other (See Comments) "Distonic" "Distonic" Dystonic reaction Dystonic Reaction   Azithromycin Other (See Comments)    Was told not to take Z pak due to his heart   Montelukast     headache, nausea, feeling a little anxious, panic   Morphine Other (See Comments)    Other reaction(s): Other (See Comments) " I stop breathing"; can take Diluadid " I stop breathing"; can take Diluadid Cnnot tolerate d/t Drug Metabolism   Tramadol Nausea Only    No orders of the defined types were placed in this encounter.   AUTHORIZATION INFORMATION Primary Insurance: 1D#: Group #:  Secondary Insurance: 1D#: Group #:  SCHEDULE INFORMATION: Date: 12/15/2021 Time: Location:[facility]

## 2021-12-08 ENCOUNTER — Ambulatory Visit: Payer: Medicare Other | Admitting: Internal Medicine

## 2021-12-11 ENCOUNTER — Ambulatory Visit (INDEPENDENT_AMBULATORY_CARE_PROVIDER_SITE_OTHER): Payer: Medicare Other | Admitting: Internal Medicine

## 2021-12-11 ENCOUNTER — Encounter: Payer: Self-pay | Admitting: Internal Medicine

## 2021-12-11 ENCOUNTER — Other Ambulatory Visit: Payer: Self-pay

## 2021-12-11 VITALS — BP 145/77 | HR 76 | Temp 98.4°F | Ht 67.99 in | Wt 182.0 lb

## 2021-12-11 DIAGNOSIS — E119 Type 2 diabetes mellitus without complications: Secondary | ICD-10-CM

## 2021-12-11 DIAGNOSIS — E1165 Type 2 diabetes mellitus with hyperglycemia: Secondary | ICD-10-CM

## 2021-12-11 LAB — BAYER DCA HB A1C WAIVED: HB A1C (BAYER DCA - WAIVED): 11.9 % — ABNORMAL HIGH (ref 4.8–5.6)

## 2021-12-11 MED ORDER — LANTUS SOLOSTAR 100 UNIT/ML ~~LOC~~ SOPN: 30.0000 [IU] | PEN_INJECTOR | Freq: Every day | SUBCUTANEOUS | 2 refills | Status: DC

## 2021-12-11 MED ORDER — CYCLOBENZAPRINE HCL 5 MG PO TABS: 5.0000 mg | ORAL_TABLET | Freq: Every evening | ORAL | 0 refills | Status: DC

## 2021-12-11 MED ORDER — DICLOFENAC SODIUM 1 % EX GEL: 2.0000 g | Freq: Four times a day (QID) | CUTANEOUS | 2 refills | Status: DC

## 2021-12-11 NOTE — Progress Notes (Signed)
Pl let him know this is high still but better from 13.6 -- -11.9  and need to increase his insulin as d/w him at visit

## 2021-12-11 NOTE — Progress Notes (Signed)
BP (!) 145/77    Pulse 76    Temp 98.4 F (36.9 C) (Oral)    Ht 5' 7.99" (1.727 m)    Wt 182 lb (82.6 kg)    SpO2 96%    BMI 27.68 kg/m    Subjective:    Patient ID: Shaun Odonnell, male    DOB: 04-Jun-1966, 56 y.o.   MRN: [id number]  Chief Complaint  Patient presents with   [place] last Thursday, was doing deep knee bends and then fell over a strip of carpet. Having a hard time walking. Patient states that he can barely walk.     HPI: Shaun Odonnell is a 56 y.o. male  Fall Incident onset: fell after he did some knee bends puled every muslce in upper thigh and groin.  Diabetes He presents for his follow-up diabetic visit. He has type 2 diabetes mellitus.   Chief Complaint  Patient presents with   [place] last Thursday, was doing deep knee bends and then fell over a strip of carpet. Having a hard time walking. Patient states that he can barely walk.     Relevant past medical, surgical, family and social history reviewed and updated as indicated. Interim medical history since our last visit reviewed. Allergies and medications reviewed and updated.  Review of Systems  Per HPI unless specifically indicated above     Objective:    BP (!) 145/77    Pulse 76    Temp 98.4 F (36.9 C) (Oral)    Ht 5' 7.99" (1.727 m)    Wt 182 lb (82.6 kg)    SpO2 96%    BMI 27.68 kg/m   Wt Readings from Last 3 Encounters:  12/11/21 182 lb (82.6 kg)  12/04/21 176 lb (79.8 kg)  11/30/21 182 lb (82.6 kg)    Physical Exam Vitals and nursing note reviewed.  Constitutional:      General: He is not in acute distress.    Appearance: Normal appearance. He is not ill-appearing or diaphoretic.  HENT:     Head: Normocephalic and atraumatic.     Right Ear: Tympanic membrane and external ear normal. There is no impacted cerumen.     Left Ear: External ear normal.     Nose: No congestion or rhinorrhea.     Mouth/Throat:     Pharynx: No oropharyngeal exudate or posterior oropharyngeal  erythema.  Eyes:     Conjunctiva/sclera: Conjunctivae normal.     Pupils: Pupils are equal, round, and reactive to light.  Cardiovascular:     Rate and Rhythm: Normal rate and regular rhythm.     Heart sounds: No murmur heard.   No friction rub. No gallop.  Pulmonary:     Effort: No respiratory distress.     Breath sounds: No stridor. No wheezing or rhonchi.  Chest:     Chest wall: No tenderness.  Abdominal:     General: Abdomen is flat. Bowel sounds are normal.     Palpations: Abdomen is soft. There is no mass.     Tenderness: There is no abdominal tenderness.  Musculoskeletal:     Cervical back: Normal range of motion and neck supple. No rigidity or tenderness.     Left lower leg: No edema.  Skin:    General: Skin is warm and dry.  Neurological:     General: No focal deficit present.     Mental Status: He is alert and oriented to  person, place, and time.     Sensory: No sensory deficit.  Psychiatric:        Mood and Affect: Mood normal.        Behavior: Behavior normal.        Thought Content: Thought content normal.    Results for orders placed or performed during the hospital encounter of 11/12/21  Lipase, blood  Result Value Ref Range   Lipase 244 (H) 11 - 51 U/L  Comprehensive metabolic panel  Result Value Ref Range   Sodium 134 (L) 135 - 145 mmol/L   Potassium 3.9 3.5 - 5.1 mmol/L   Chloride 96 (L) 98 - 111 mmol/L   CO2 29 22 - 32 mmol/L   Glucose, Bld 186 (H) 70 - 99 mg/dL   BUN 12 6 - 20 mg/dL   Creatinine, Ser 0.64 0.61 - 1.24 mg/dL   Calcium 9.3 8.9 - 10.3 mg/dL   Total Protein 7.7 6.5 - 8.1 g/dL   Albumin 4.5 3.5 - 5.0 g/dL   AST 17 15 - 41 U/L   ALT 14 0 - 44 U/L   Alkaline Phosphatase 92 38 - 126 U/L   Total Bilirubin 0.5 0.3 - 1.2 mg/dL   GFR, Estimated >60 >60 mL/min   Anion gap 9 5 - 15  CBC  Result Value Ref Range   WBC 11.8 (H) 4.0 - 10.5 K/uL   RBC 5.03 4.22 - 5.81 MIL/uL   Hemoglobin 16.2 13.0 - 17.0 g/dL   HCT 47.2 39.0 - 52.0 %   MCV  93.8 80.0 - 100.0 fL   MCH 32.2 26.0 - 34.0 pg   MCHC 34.3 30.0 - 36.0 g/dL   RDW 12.4 11.5 - 15.5 %   Platelets 217 150 - 400 K/uL   nRBC 0.0 0.0 - 0.2 %  Troponin I (High Sensitivity)  Result Value Ref Range   Troponin I (High Sensitivity) 5 <18 ng/L        Current Outpatient Medications:    acetaminophen (TYLENOL) 500 MG tablet, Take 1 tablet (500 mg total) by mouth every 6 (six) hours as needed., Disp: 30 tablet, Rfl: 0   albuterol (PROVENTIL) (2.5 MG/3ML) 0.083% nebulizer solution, Take 3 mLs (2.5 mg total) by nebulization every 6 (six) hours as needed for wheezing or shortness of breath., Disp: 360 mL, Rfl: 5   albuterol (VENTOLIN HFA) 108 (90 Base) MCG/ACT inhaler, INHALE 2 PUFFS BY MOUTH EVERY 6 HOURS ASNEEDED WHEEZING/ SHORTNESS OF BREATH, Disp: 8.5 g, Rfl: 1   ALPRAZolam (XANAX) 0.5 MG tablet, Take 0.5 mg by mouth 4 (four) times daily. Patient takes when wakes up(~0600)/ 1000/ 1600/ 2000, Disp: , Rfl:    budesonide-formoterol (SYMBICORT) 80-4.5 MCG/ACT inhaler, Inhale 2 puffs into the lungs in the morning and at bedtime., Disp: 1 each, Rfl: 12   carboxymethylcellulose (REFRESH PLUS) 0.5 % SOLN, Place 2 drops into both eyes daily as needed (Dry eyes)., Disp: , Rfl:    Dulaglutide (TRULICITY) 3 SW/5.4OE SOPN, Inject 3 mg as directed once a week., Disp: 4 mL, Rfl: 2   empagliflozin (JARDIANCE) 25 MG TABS tablet, Take 1 tablet (25 mg total) by mouth daily before breakfast., Disp: 30 tablet, Rfl: 2   eszopiclone (LUNESTA) 2 MG TABS tablet, Take 2 mg by mouth at bedtime as needed., Disp: , Rfl:    fenofibrate (TRICOR) 145 MG tablet, Take 1 tablet (145 mg total) by mouth daily., Disp: 30 tablet, Rfl: 2   glucose blood test strip, Use as  instructed, Disp: 200 each, Rfl: 12   ibuprofen (ADVIL) 400 MG tablet, Take 1 tablet (400 mg total) by mouth every 6 (six) hours as needed., Disp: 30 tablet, Rfl: 0   insulin glargine (LANTUS SOLOSTAR) 100 UNIT/ML Solostar Pen, Inject 20  Units into the skin at bedtime. INJECT 10 UNITS SUBCUTANEOUSLY AT BEDTIME, Disp: 15 mL, Rfl: 2   lipase/protease/amylase (CREON) 36000 UNITS CPEP capsule, Take 2 capsules (72,000 Units total) by mouth 3 (three) times daily with meals. May also take 1 capsule (36,000 Units total) as needed (with snacks)., Disp: 240 capsule, Rfl: 11   OLANZapine (ZYPREXA) 20 MG tablet, Take 20 mg by mouth at bedtime., Disp: , Rfl:    omeprazole (PRILOSEC) 40 MG capsule, Take 1 capsule (40 mg total) by mouth daily., Disp: 30 capsule, Rfl: 6   ondansetron (ZOFRAN-ODT) 8 MG disintegrating tablet, Take 1 tablet (8 mg total) by mouth every 8 (eight) hours as needed for nausea or vomiting., Disp: 20 tablet, Rfl: 0   OneTouch Delica Lancets 86L MISC, USE TO CHECK FASTING SUGAR TWICE DAILY, Disp: 100 each, Rfl: 1   sertraline (ZOLOFT) 100 MG tablet, Take 100 mg by mouth daily. , Disp: , Rfl:    simvastatin (ZOCOR) 80 MG tablet, TAKE 1 TABLET BY MOUTH AT BEDTIME, Disp: 90 tablet, Rfl: 0   ULTRACARE PEN NEEDLES 32G X 4 MM MISC, USE ONCE DAILY WITH INSULIN, Disp: 90 each, Rfl: 1    Assessment & Plan:  Fall  Msule aches, will start voltaren gel use 3-4 times a day and flexeril at night.  2. DM  Increase lantus 30 units q am, continue trulicity and jardiance 25 mg. Sees endocrine x 2 weeks  check HbA1c,  urine  microalbumin  diabetic diet plan given to pt  adviced regarding hypoglycemia and instructions given to pt today on how to prevent and treat the same if it were to occur. pt acknowledges the plan and voices understanding of the same.  exercise plan given and encouraged.   advice diabetic yearly podiatry, ophthalmology , nutritionist , dental check q 6 months,  3.Chronic alcoholic pancreatitis. Sees  Dr. Valarie Merino Mansouraty with [facility] fu and mx per such of note last Lipase high at 233 11/2021   Problem List Items Addressed This Visit   None    No orders of the defined types were placed in  this encounter.    No orders of the defined types were placed in this encounter.    Follow up plan: No follow-ups on file.

## 2021-12-12 ENCOUNTER — Other Ambulatory Visit: Payer: Self-pay | Admitting: Internal Medicine

## 2021-12-12 ENCOUNTER — Telehealth: Payer: Self-pay

## 2021-12-12 ENCOUNTER — Other Ambulatory Visit: Payer: Self-pay

## 2021-12-12 ENCOUNTER — Telehealth: Payer: Self-pay | Admitting: Internal Medicine

## 2021-12-12 MED ORDER — PEG 3350-KCL-NA BICARB-NACL 420 G PO SOLR: 4000.0000 mL | Freq: Once | ORAL | 0 refills | Status: AC

## 2021-12-12 NOTE — Addendum Note (Signed)
Addended by: Eliseo Squires on: 12/12/2021 10:08 AM   Modules accepted: Orders

## 2021-12-12 NOTE — Telephone Encounter (Signed)
Requested Prescriptions  Pending Prescriptions Disp Refills   JARDIANCE 25 MG TABS tablet [Pharmacy Med Name: JARDIANCE 25 MG TAB] 30 tablet 2    Sig: TAKE 1 TABLET BY MOUTH ONCE DAILY BEFOREBREAKFAST     Endocrinology:  Diabetes - SGLT2 Inhibitors Failed - 12/12/2021  1:38 PM      Failed - HBA1C is between 0 and 7.9 and within 180 days    Hemoglobin A1C  Date Value Ref Range Status  08/03/2014 5.7 4.2 - 6.3 % Final    Comment:    The American Diabetes Association recommends that a primary goal of therapy should be <7% and that physicians should reevaluate the treatment regimen in patients with HbA1c values consistently >8%.    HB A1C (BAYER DCA - WAIVED)  Date Value Ref Range Status  12/11/2021 11.9 (H) 4.8 - 5.6 % Final    Comment:             Prediabetes: 5.7 - 6.4          Diabetes: >6.4          Glycemic control for adults with diabetes: <7.0          Passed - Cr in normal range and within 360 days    Creatinine  Date Value Ref Range Status  11/26/2014 0.79 0.60 - 1.30 mg/dL Final   Creatinine, Ser  Date Value Ref Range Status  11/11/2021 0.64 0.61 - 1.24 mg/dL Final         Passed - eGFR in normal range and within 360 days    EGFR (African American)  Date Value Ref Range Status  11/26/2014 >60 >29m/min Final  06/15/2014 >60  Final   GFR calc Af Amer  Date Value Ref Range Status  12/13/2020 128 >59 mL/min/1.73 Final    Comment:    **In accordance with recommendations from the NKF-ASN Task force,**   [facility] is in the process of updating its eGFR calculation to the   2021 CKD-EPI creatinine equation that estimates kidney function   without a race variable.    EGFR (Non-African Amer.)  Date Value Ref Range Status  11/26/2014 >60 >666mmin Final    Comment:    eGFR values <6085min/1.73 m2 may be an indication of chronic kidney disease (CKD). Calculated eGFR, using the MRDR Study equation, is useful in  patients with stable renal function. The eGFR  calculation will not be reliable in acutely ill patients when serum creatinine is changing rapidly. It is not useful in patients on dialysis. The eGFR calculation may not be applicable to patients at the low and high extremes of body sizes, pregnant women, and vegetarians.   06/15/2014 >60  Final    Comment:    eGFR values <62m54mn/1.73 m2 may be an indication of chronic kidney disease (CKD). Calculated eGFR is useful in patients with stable renal function. The eGFR calculation will not be reliable in acutely ill patients when serum creatinine is changing rapidly. It is not useful in  patients on dialysis. The eGFR calculation may not be applicable to patients at the low and high extremes of body sizes, pregnant women, and vegetarians.    GFR, Estimated  Date Value Ref Range Status  11/11/2021 >60 >60 mL/min Final    Comment:    (NOTE) Calculated using the CKD-EPI Creatinine Equation (2021)    eGFR  Date Value Ref Range Status  10/17/2021 107 >59 mL/min/1.73 Final         Passed - Valid encounter within  last 6 months    Recent Outpatient Visits          Yesterday Diabetes mellitus without complication ([place])   [place] Vigg, Avanti, MD   1 week ago Fluid level behind tympanic membrane of both ears   [facility], NP   4 weeks ago Alcohol-induced acute pancreatitis, unspecified complication status   [facility] Vigg, Avanti, MD   1 month ago Acute non-recurrent sinusitis, unspecified location   [facility] Vigg, Avanti, MD   2 months ago [facility] Vigg, Avanti, MD      Future Appointments            In 3 months Vigg, Avanti, MD [facility]

## 2021-12-12 NOTE — Telephone Encounter (Signed)
Patient left voicemail and has some questions about the date of his procedure and then about the prep

## 2021-12-12 NOTE — Telephone Encounter (Signed)
Copied from [place] [phone]. Topic: General - Other >> Dec 12, 2021  8:43 AM Valere Dross wrote: Reason for CRM: Pt called in stating once he picked up his medication for the insulin glargine (LANTUS SOLOSTAR) 100 UNIT/ML Solostar Pen it was pointed out for him to take 30 units daily, but then 10 units again before bed time, and pt states he was never instructed on those instructions and requested a call to make sure if that is correct, please advise.

## 2021-12-12 NOTE — Progress Notes (Signed)
30 untis total please call and let him know thnx -- just ask him what he was taking I have it as if he was taking 20 untis once a day

## 2021-12-12 NOTE — Telephone Encounter (Signed)
Message sent to PCP.

## 2021-12-12 NOTE — Telephone Encounter (Signed)
CALLED PATIENT BACK AND ANSWERED ALL QUESTIONS ABOUT HIS PREP

## 2021-12-12 NOTE — Progress Notes (Signed)
RESENT PREP AND COMMUNICATIONS

## 2021-12-15 ENCOUNTER — Ambulatory Visit: Payer: Medicare Other | Admitting: Anesthesiology

## 2021-12-15 ENCOUNTER — Encounter: Payer: Self-pay | Admitting: Internal Medicine

## 2021-12-15 ENCOUNTER — Ambulatory Visit
Admission: RE | Admit: 2021-12-15 | Discharge: 2021-12-15 | Disposition: A | Discharge status: Home / Self Care | Payer: Medicare Other | Source: Ambulatory Visit | Attending: Gastroenterology | Admitting: Gastroenterology

## 2021-12-15 ENCOUNTER — Encounter: Payer: Self-pay | Admitting: Gastroenterology

## 2021-12-15 ENCOUNTER — Encounter
Admission: RE | Disposition: A | Discharge status: Home / Self Care | Payer: Self-pay | Source: Ambulatory Visit | Attending: Gastroenterology

## 2021-12-15 ENCOUNTER — Other Ambulatory Visit: Payer: Self-pay | Admitting: Internal Medicine

## 2021-12-15 ENCOUNTER — Other Ambulatory Visit: Payer: Self-pay

## 2021-12-15 DIAGNOSIS — Z860100 Personal history of colon polyps, unspecified: Secondary | ICD-10-CM

## 2021-12-15 DIAGNOSIS — D122 Benign neoplasm of ascending colon: Secondary | ICD-10-CM | POA: Insufficient documentation

## 2021-12-15 DIAGNOSIS — F17210 Nicotine dependence, cigarettes, uncomplicated: Secondary | ICD-10-CM | POA: Diagnosis not present

## 2021-12-15 DIAGNOSIS — D126 Benign neoplasm of colon, unspecified: Secondary | ICD-10-CM | POA: Diagnosis not present

## 2021-12-15 DIAGNOSIS — K219 Gastro-esophageal reflux disease without esophagitis: Secondary | ICD-10-CM | POA: Diagnosis not present

## 2021-12-15 DIAGNOSIS — K635 Polyp of colon: Secondary | ICD-10-CM | POA: Diagnosis not present

## 2021-12-15 DIAGNOSIS — F418 Other specified anxiety disorders: Secondary | ICD-10-CM | POA: Diagnosis not present

## 2021-12-15 DIAGNOSIS — I10 Essential (primary) hypertension: Secondary | ICD-10-CM | POA: Diagnosis not present

## 2021-12-15 DIAGNOSIS — Z1211 Encounter for screening for malignant neoplasm of colon: Secondary | ICD-10-CM | POA: Insufficient documentation

## 2021-12-15 DIAGNOSIS — E119 Type 2 diabetes mellitus without complications: Secondary | ICD-10-CM | POA: Diagnosis not present

## 2021-12-15 DIAGNOSIS — E785 Hyperlipidemia, unspecified: Secondary | ICD-10-CM | POA: Insufficient documentation

## 2021-12-15 DIAGNOSIS — J449 Chronic obstructive pulmonary disease, unspecified: Secondary | ICD-10-CM | POA: Diagnosis not present

## 2021-12-15 HISTORY — PX: COLONOSCOPY WITH PROPOFOL: SHX5780

## 2021-12-15 HISTORY — PX: POLYPECTOMY: SHX5525

## 2021-12-15 LAB — GLUCOSE, CAPILLARY
Glucose-Capillary: 148 mg/dL — ABNORMAL HIGH (ref 70–99)
Glucose-Capillary: 150 mg/dL — ABNORMAL HIGH (ref 70–99)

## 2021-12-15 SURGERY — COLONOSCOPY WITH PROPOFOL
Anesthesia: General | Site: Rectum

## 2021-12-15 MED ORDER — SODIUM CHLORIDE 0.9 % IV SOLN: INTRAVENOUS | Status: DC

## 2021-12-15 MED ORDER — LIDOCAINE HCL (CARDIAC) PF 100 MG/5ML IV SOSY
PREFILLED_SYRINGE | INTRAVENOUS | Status: DC | PRN
  Administered 2021-12-15: 50 mg via INTRAVENOUS

## 2021-12-15 MED ORDER — CYCLOBENZAPRINE HCL 5 MG PO TABS: 5.0000 mg | ORAL_TABLET | Freq: Every evening | ORAL | 0 refills | Status: DC

## 2021-12-15 MED ORDER — PROPOFOL 10 MG/ML IV BOLUS
INTRAVENOUS | Status: DC | PRN
  Administered 2021-12-15 (×2): 40 mg via INTRAVENOUS
  Administered 2021-12-15: 30 mg via INTRAVENOUS
  Administered 2021-12-15: 40 mg via INTRAVENOUS
  Administered 2021-12-15: 150 mg via INTRAVENOUS
  Administered 2021-12-15: 20 mg via INTRAVENOUS
  Administered 2021-12-15: 40 mg via INTRAVENOUS
  Administered 2021-12-15: 30 mg via INTRAVENOUS
  Administered 2021-12-15: 20 mg via INTRAVENOUS

## 2021-12-15 MED ORDER — STERILE WATER FOR IRRIGATION IR SOLN
Status: DC | PRN
  Administered 2021-12-15: 1

## 2021-12-15 MED ORDER — ACETAMINOPHEN 10 MG/ML IV SOLN: 1000.0000 mg | Freq: Once | INTRAVENOUS | Status: DC | PRN

## 2021-12-15 MED ORDER — ONDANSETRON HCL 4 MG/2ML IJ SOLN: 4.0000 mg | Freq: Once | INTRAMUSCULAR | Status: DC | PRN

## 2021-12-15 MED ORDER — LACTATED RINGERS IV SOLN: INTRAVENOUS | Status: DC

## 2021-12-15 SURGICAL SUPPLY — 8 items
GOWN CVR UNV OPN BCK APRN NK (MISCELLANEOUS) ×4 IMPLANT
GOWN ISOL THUMB LOOP REG UNIV (MISCELLANEOUS) ×4
KIT PRC NS LF DISP ENDO (KITS) ×1 IMPLANT
KIT PROCEDURE OLYMPUS (KITS) ×2
MANIFOLD NEPTUNE II (INSTRUMENTS) ×2 IMPLANT
SNARE COLD EXACTO (MISCELLANEOUS) ×1 IMPLANT
TRAP ETRAP POLY (MISCELLANEOUS) ×1 IMPLANT
WATER STERILE IRR 250ML POUR (IV SOLUTION) ×2 IMPLANT

## 2021-12-15 NOTE — Op Note (Signed)
[facility] Patient Name: Shaun Odonnell Procedure Date: 12/15/2021 7:09 AM MRN: [id number] Account #: [id number] Date of Birth: 01-25-1966 Admit Type: Outpatient Age: 56 Room: [facility] OR ROOM 01 Gender: Male Note Status: Finalized Instrument Name: [id number] Procedure:             Colonoscopy Indications:           High risk colon cancer surveillance: Personal history                         of colonic polyps Providers:             [name] MD, MD Referring MD:          Charlynne Cousins (Referring MD) Medicines:             Propofol per Anesthesia Complications:         No immediate complications. Procedure:             Pre-Anesthesia Assessment:                        - Prior to the procedure, a History and Physical was                         performed, and patient medications and allergies were                         reviewed. The patient's tolerance of previous                         anesthesia was also reviewed. The risks and benefits                         of the procedure and the sedation options and risks                         were discussed with the patient. All questions were                         answered, and informed consent was obtained. Prior                         Anticoagulants: The patient has taken no previous                         anticoagulant or antiplatelet agents. ASA Grade                         Assessment: II - A patient with mild systemic disease.                         After reviewing the risks and benefits, the patient                         was deemed in satisfactory condition to undergo the                         procedure.  After obtaining informed consent, the colonoscope was                         passed under direct vision. Throughout the procedure,                         the patient's blood pressure, pulse, and oxygen                         saturations were monitored  continuously. The                         Colonoscope was introduced through the anus and                         advanced to the the cecum, identified by appendiceal                         orifice and ileocecal valve. The colonoscopy was                         performed without difficulty. The patient tolerated                         the procedure well. The quality of the bowel                         preparation was excellent. Findings:      The perianal and digital rectal examinations were normal.      Two sessile polyps were found in the ascending colon. The polyps were 2       to 5 mm in size. These polyps were removed with a cold snare. Resection       and retrieval were complete.      A 7 mm polyp was found in the descending colon. The polyp was sessile.       The polyp was removed with a cold snare. Resection and retrieval were       complete.      Non-bleeding internal hemorrhoids were found during retroflexion. The       hemorrhoids were Grade I (internal hemorrhoids that do not prolapse). Impression:            - Two 2 to 5 mm polyps in the ascending colon, removed                         with a cold snare. Resected and retrieved.                        - One 7 mm polyp in the descending colon, removed with                         a cold snare. Resected and retrieved.                        - Non-bleeding internal hemorrhoids. Recommendation:        - Discharge patient to home.                        -  Resume previous diet.                        - Continue present medications.                        - Await pathology results.                        - Repeat colonoscopy in 5 years for surveillance. Procedure Code(s):     --- Professional ---                        [phone], Colonoscopy, flexible; with removal of                         tumor(s), polyp(s), or other lesion(s) by snare                         technique Diagnosis Code(s):     --- Professional ---                         Z86.010, Personal history of colonic polyps                        K63.5, Polyp of colon CPT copyright 2019 American Medical Association. All rights reserved. The codes documented in this report are preliminary and upon coder review may  be revised to meet current compliance requirements. [name] MD, MD 12/15/2021 7:51:56 AM This report has been signed electronically. Number of Addenda: 0 Note Initiated On: 12/15/2021 7:09 AM Scope Withdrawal Time: 0 hours 12 minutes 15 seconds  Total Procedure Duration: 0 hours 17 minutes 20 seconds  Estimated Blood Loss:  Estimated blood loss: none.      [facility]

## 2021-12-15 NOTE — Anesthesia Preprocedure Evaluation (Signed)
Anesthesia Evaluation  Patient identified by MRN, date of birth, ID band Patient awake    Reviewed: Allergy & Precautions, NPO status , Patient's Chart, lab work & pertinent test results, reviewed documented beta blocker date and time   History of Anesthesia Complications Negative for: history of anesthetic complications  Airway Mallampati: II  TM Distance: >3 FB Neck ROM: Full    Dental  (+)    Pulmonary COPD, Current SmokerPatient did not abstain from smoking.,    breath sounds clear to auscultation       Cardiovascular hypertension, (-) angina(-) DOE + dysrhythmias Supra Ventricular Tachycardia  Rhythm:Regular Rate:Normal   HLD   Neuro/Psych PSYCHIATRIC DISORDERS Anxiety Depression Bipolar Disorder Schizophrenia    GI/Hepatic neg GERD  ,(+)     substance abuse  alcohol use, Fatty liver   Endo/Other  diabetes  Renal/GU CRFRenal disease     Musculoskeletal   Abdominal   Peds  Hematology  (+) Blood dyscrasia (H/o PE), ,   Anesthesia Other Findings   Reproductive/Obstetrics                            Anesthesia Physical Anesthesia Plan  ASA: 3  Anesthesia Plan: General   Post-op Pain Management:    Induction: Intravenous  PONV Risk Score and Plan: 1 and Propofol infusion, TIVA and Treatment may vary due to age or medical condition  Airway Management Planned: Natural Airway and Nasal Cannula  Additional Equipment:   Intra-op Plan:   Post-operative Plan:   Informed Consent: I have reviewed the patients History and Physical, chart, labs and discussed the procedure including the risks, benefits and alternatives for the proposed anesthesia with the patient or authorized representative who has indicated his/her understanding and acceptance.       Plan Discussed with: CRNA and Anesthesiologist  Anesthesia Plan Comments:         Anesthesia Quick Evaluation

## 2021-12-15 NOTE — Addendum Note (Signed)
Addended by: Donzetta Kohut A on: 12/15/2021 01:18 PM   Modules accepted: Orders

## 2021-12-15 NOTE — H&P (Signed)
[name], MD [address] [place], [place] [address] Phone:[phone] Fax : [phone]  Primary Care Physician:  Charlynne Cousins, MD Primary Gastroenterologist:  Dr. Allen Norris  Pre-Procedure History & Physical: HPI:  Shaun Odonnell is a 56 y.o. male is here for an colonoscopy.   Past Medical History:  Diagnosis Date   Alcohol abuse 04/29/2019   Allergy    Anxiety    ARDS (adult respiratory distress syndrome) (HCC)    Bipolar disorder (HCC)    Cataract    Chronic kidney disease    per pt - no current problems   COPD (chronic obstructive pulmonary disease) (HCC)    chronic cough and wheezing   Depression    Diabetes mellitus without complication ([place])    type 2   Dizziness    medication related   Dyspnea    with exertion   Elevated lipids    Fatty liver    Hypercholesterolemia    Hypertension    per pt, no current prob-no meds   Pancreatitis    Pneumonia    in past   Pulmonary embolism ([place])    Schizoaffective disorder ([place])     Past Surgical History:  Procedure Laterality Date   BIOPSY  08/31/2021   Procedure: BIOPSY;  Surgeon: Irving Copas., MD;  Location: [place];  Service: Gastroenterology;;   CATARACT EXTRACTION W/PHACO Right 03/26/2018   Procedure: CATARACT EXTRACTION PHACO AND INTRAOCULAR LENS PLACEMENT ([place]) RIGHT BORDERLINE DIABETIC;  Surgeon: Leandrew Koyanagi, MD;  Location: [place];  Service: Ophthalmology;  Laterality: Right;   CATARACT EXTRACTION W/PHACO Left 04/23/2018   Procedure: CATARACT EXTRACTION PHACO AND INTRAOCULAR LENS PLACEMENT ([place])  LEFT BORDERLINE DIABETIC;  Surgeon: Leandrew Koyanagi, MD;  Location: [place];  Service: Ophthalmology;  Laterality: Left;   COLONOSCOPY     COLONOSCOPY WITH PROPOFOL N/A 08/21/2017   Procedure: COLONOSCOPY WITH PROPOFOL;  Surgeon: Manya Silvas, MD;  Location: [facility];  Service: Endoscopy;  Laterality: N/A;   ENDOSCOPIC RETROGRADE  CHOLANGIOPANCREATOGRAPHY (ERCP) WITH PROPOFOL N/A 08/31/2021   Procedure: ENDOSCOPIC RETROGRADE CHOLANGIOPANCREATOGRAPHY (ERCP) WITH PROPOFOL;  Surgeon: Rush Landmark Telford Nab., MD;  Location: [place];  Service: Gastroenterology;  Laterality: N/A;   ESOPHAGOGASTRODUODENOSCOPY (EGD) WITH PROPOFOL N/A 08/31/2021   Procedure: ESOPHAGOGASTRODUODENOSCOPY (EGD) WITH PROPOFOL;  Surgeon: Rush Landmark Telford Nab., MD;  Location: [place];  Service: Gastroenterology;  Laterality: N/A;   EYE SURGERY     FINE NEEDLE ASPIRATION N/A 08/31/2021   Procedure: FINE NEEDLE ASPIRATION (FNA) LINEAR;  Surgeon: Irving Copas., MD;  Location: [place];  Service: Gastroenterology;  Laterality: N/A;   HERNIA REPAIR     inguinal and umbilical   RIB RESECTION N/A 08/20/2019   Procedure: removal of xyphoid process;  Surgeon: Nestor Lewandowsky, MD;  Location: [facility];  Service: Thoracic;  Laterality: N/A;   SPHINCTEROTOMY  08/31/2021   Procedure: Joan Mayans;  Surgeon: Mansouraty, Telford Nab., MD;  Location: [place];  Service: Gastroenterology;;   UMBILICAL HERNIA REPAIR N/A 06/22/2019   Procedure: OPEN HERNIA REPAIR UMBILICAL ADULT;  Surgeon: Olean Ree, MD;  Location: [facility];  Service: General;  Laterality: N/A;   UPPER ESOPHAGEAL ENDOSCOPIC ULTRASOUND (EUS) N/A 08/31/2021   Procedure: UPPER ESOPHAGEAL ENDOSCOPIC ULTRASOUND (EUS);  Surgeon: Irving Copas., MD;  Location: [place];  Service: Gastroenterology;  Laterality: N/A;   UPPER GI ENDOSCOPY      Prior to Admission medications   Medication Sig Start Date End Date Taking? Authorizing Provider  acetaminophen (TYLENOL) 500 MG  tablet Take 1 tablet (500 mg total) by mouth every 6 (six) hours as needed. 11/22/21  Yes Mansouraty, Telford Nab., MD  albuterol (VENTOLIN HFA) 108 (90 Base) MCG/ACT inhaler INHALE 2 PUFFS BY MOUTH EVERY 6 HOURS ASNEEDED WHEEZING/ SHORTNESS OF BREATH 03/02/21  Yes Birdie Sons, MD  ALPRAZolam Duanne Moron)  0.5 MG tablet Take 0.5 mg by mouth 4 (four) times daily. Patient takes when wakes up(~0600)/ 1000/ 1600/ 2000 06/26/18  Yes [provider]  budesonide-formoterol (SYMBICORT) 80-4.5 MCG/ACT inhaler Inhale 2 puffs into the lungs in the morning and at bedtime. 05/18/21  Yes Chesley Mires, MD  carboxymethylcellulose (REFRESH PLUS) 0.5 % SOLN Place 2 drops into both eyes daily as needed (Dry eyes).   Yes [provider]  cyclobenzaprine (FLEXERIL) 5 MG tablet Take 1 tablet (5 mg total) by mouth every evening for 7 days. 12/11/21 12/18/21 Yes Vigg, Avanti, MD  diclofenac Sodium (VOLTAREN) 1 % GEL Apply 2 g topically 4 (four) times daily. 12/11/21  Yes Vigg, Avanti, MD  Dulaglutide (TRULICITY) 3 YK/9.9IP SOPN Inject 3 mg as directed once a week. 11/10/21  Yes Vigg, Avanti, MD  eszopiclone (LUNESTA) 2 MG TABS tablet Take 2 mg by mouth at bedtime as needed. 11/08/21  Yes [provider]  fenofibrate (TRICOR) 145 MG tablet Take 1 tablet (145 mg total) by mouth daily. 10/17/21  Yes Vigg, Avanti, MD  glucose blood test strip Use as instructed 12/17/19  Yes Pollak, Adriana M, PA-C  ibuprofen (ADVIL) 400 MG tablet Take 1 tablet (400 mg total) by mouth every 6 (six) hours as needed. 11/22/21  Yes Mansouraty, Telford Nab., MD  insulin glargine (LANTUS SOLOSTAR) 100 UNIT/ML Solostar Pen Inject 30 Units into the skin daily. INJECT 10 UNITS SUBCUTANEOUSLY AT BEDTIME 12/11/21  Yes Vigg, Avanti, MD  JARDIANCE 25 MG TABS tablet TAKE 1 TABLET BY MOUTH ONCE DAILY BEFOREBREAKFAST 12/12/21  Yes Vigg, Avanti, MD  lipase/protease/amylase (CREON) 36000 UNITS CPEP capsule Take 2 capsules (72,000 Units total) by mouth 3 (three) times daily with meals. May also take 1 capsule (36,000 Units total) as needed (with snacks). 09/08/21  Yes Mansouraty, Telford Nab., MD  OLANZapine (ZYPREXA) 20 MG tablet Take 20 mg by mouth at bedtime. 04/04/21  Yes [provider]  omeprazole (PRILOSEC) 40 MG capsule Take 1 capsule (40 mg  total) by mouth daily. 08/31/21  Yes Mansouraty, Telford Nab., MD  ondansetron (ZOFRAN-ODT) 8 MG disintegrating tablet Take 1 tablet (8 mg total) by mouth every 8 (eight) hours as needed for nausea or vomiting. 08/28/21  Yes Vigg, Avanti, MD  OneTouch Delica Lancets 38S MISC USE TO CHECK FASTING SUGAR TWICE DAILY 12/07/20  Yes Carles Collet M, PA-C  sertraline (ZOLOFT) 100 MG tablet Take 100 mg by mouth daily.    Yes [provider]  simvastatin (ZOCOR) 80 MG tablet TAKE 1 TABLET BY MOUTH AT BEDTIME 11/22/21  Yes Vigg, Avanti, MD  ULTRACARE PEN NEEDLES 32G X 4 MM MISC USE ONCE DAILY WITH INSULIN 07/27/21  Yes Tally Joe T, FNP  albuterol (PROVENTIL) (2.5 MG/3ML) 0.083% nebulizer solution Take 3 mLs (2.5 mg total) by nebulization every 6 (six) hours as needed for wheezing or shortness of breath. 05/18/21   Chesley Mires, MD    Allergies as of 12/07/2021 - Review Complete 12/07/2021  Allergen Reaction Noted   Fentanyl Nausea And Vomiting 01/12/2019   Morphine and related Shortness Of Breath 06/02/2015   Clonazepam Other (See Comments) 10/16/2014   Pimozide Other (See Comments) 10/16/2014  Trifluoperazine Other (See Comments) 03/11/2014   Azithromycin Other (See Comments) 12/10/2019   Montelukast  07/05/2020   Morphine Other (See Comments) 03/11/2014   Tramadol Nausea Only 09/30/2019    Family History  Problem Relation Age of Onset   Hyperlipidemia Mother    Hypertension Father    Diabetes Maternal Aunt    Dementia Maternal Grandmother    Heart disease Maternal Grandfather    Heart disease Paternal Grandmother    Stroke Paternal Grandfather    Diabetes Paternal Grandfather    Colon cancer Neg Hx    Esophageal cancer Neg Hx    Inflammatory bowel disease Neg Hx    Liver disease Neg Hx    Pancreatic cancer Neg Hx    Stomach cancer Neg Hx    Rectal cancer Neg Hx     Social History   Socioeconomic History   Marital status: Single    Spouse name: Not on file   Number of  children: 0   Years of education: Not on file   Highest education level: Bachelor's degree (e.g., BA, AB, BS)  Occupational History   Occupation: disabled  Tobacco Use   Smoking status: Every Day    Packs/day: 2.00    Years: 34.00    Pack years: 68.00    Types: Cigarettes   Smokeless tobacco: Former    Types: Snuff    Quit date: 1990   Tobacco comments:    Patient currently vapes and now smokes .5 or less daily.   Vaping Use   Vaping Use: Every day   Substances: Nicotine, Flavoring  Substance and Sexual Activity   Alcohol use: Yes    Alcohol/week: 6.0 standard drinks    Types: 6 Cans of beer per week    Comment: 3 beers 2x a week   Drug use: Not Currently   Sexual activity: Not Currently  Other Topics Concern   Not on file  Social History Narrative   Not on file   Social Determinants of Health   Financial Resource Strain: Low Risk    Difficulty of Paying Living Expenses: Not hard at all  Food Insecurity: No Food Insecurity   Worried About Charity fundraiser in the Last Year: Never true   Ran Out of Food in the Last Year: Never true  Transportation Needs: No Transportation Needs   Lack of Transportation (Medical): No   Lack of Transportation (Non-Medical): No  Physical Activity: Inactive   Days of Exercise per Week: 0 days   Minutes of Exercise per Session: 0 min  Stress: No Stress Concern Present   Feeling of Stress : Not at all  Social Connections: Moderately Isolated   Frequency of Communication with Friends and Family: More than three times a week   Frequency of Social Gatherings with Friends and Family: Three times a week   Attends Religious Services: More than 4 times per year   Active Member of Clubs or Organizations: No   Attends Archivist Meetings: Never   Marital Status: Never married  Human resources officer Violence: Not At Risk   Fear of Current or Ex-Partner: No   Emotionally Abused: No   Physically Abused: No   Sexually Abused: No     Review of Systems: See HPI, otherwise negative ROS  Physical Exam: BP 122/83    Pulse 72    Temp (!) 97.3 F (36.3 C) (Temporal)    Resp 20    Ht 5\' 8"  (1.727 m)    Wt 81.2  kg    SpO2 97%    BMI 27.22 kg/m  General:   Alert,  pleasant and cooperative in NAD Head:  Normocephalic and atraumatic. Neck:  Supple; no masses or thyromegaly. Lungs:  Clear throughout to auscultation.    Heart:  Regular rate and rhythm. Abdomen:  Soft, nontender and nondistended. Normal bowel sounds, without guarding, and without rebound.   Neurologic:  Alert and  oriented x4;  grossly normal neurologically.  Impression/Plan: Shaun Odonnell is here for an colonoscopy to be performed for a history of adenomatous polyps on 2018   Risks, benefits, limitations, and alternatives regarding  colonoscopy have been reviewed with the patient.  Questions have been answered.  All parties agreeable.   [name], MD  12/15/2021, 7:04 AM

## 2021-12-15 NOTE — Anesthesia Procedure Notes (Signed)
Date/Time: 12/15/2021 7:28 AM Performed by: Cameron Ali, CRNA Pre-anesthesia Checklist: Patient identified, Emergency Drugs available, Suction available, Timeout performed and Patient being monitored Patient Re-evaluated:Patient Re-evaluated prior to induction Oxygen Delivery Method: Nasal cannula Placement Confirmation: positive ETCO2

## 2021-12-15 NOTE — Transfer of Care (Signed)
Immediate Anesthesia Transfer of Care Note  Patient: Shaun Odonnell  Procedure(s) Performed: COLONOSCOPY WITH BIOPSY (Rectum) POLYPECTOMY (Rectum)  Patient Location: PACU  Anesthesia Type: General  Level of Consciousness: awake, alert  and patient cooperative  Airway and Oxygen Therapy: Patient Spontanous Breathing and Patient connected to supplemental oxygen  Post-op Assessment: Post-op Vital signs reviewed, Patient's Cardiovascular Status Stable, Respiratory Function Stable, Patent Airway and No signs of Nausea or vomiting  Post-op Vital Signs: Reviewed and stable  Complications: No notable events documented.

## 2021-12-15 NOTE — Anesthesia Postprocedure Evaluation (Signed)
Anesthesia Post Note  Patient: Shaun Odonnell  Procedure(s) Performed: COLONOSCOPY WITH BIOPSY (Rectum) POLYPECTOMY (Rectum)     Patient location during evaluation: PACU Anesthesia Type: General Level of consciousness: awake and alert Pain management: pain level controlled Vital Signs Assessment: post-procedure vital signs reviewed and stable Respiratory status: spontaneous breathing, nonlabored ventilation, respiratory function stable and patient connected to nasal cannula oxygen Cardiovascular status: blood pressure returned to baseline and stable Postop Assessment: no apparent nausea or vomiting Anesthetic complications: no   No notable events documented.  Oziel Beitler A  Jacqualin Shirkey

## 2021-12-18 ENCOUNTER — Other Ambulatory Visit: Payer: Self-pay | Admitting: Internal Medicine

## 2021-12-18 ENCOUNTER — Encounter: Payer: Self-pay | Admitting: Gastroenterology

## 2021-12-18 LAB — SURGICAL PATHOLOGY

## 2021-12-18 MED ORDER — CYCLOBENZAPRINE HCL 5 MG PO TABS: 5.0000 mg | ORAL_TABLET | Freq: Every day | ORAL | 0 refills | Status: AC

## 2021-12-22 ENCOUNTER — Other Ambulatory Visit: Payer: Self-pay | Admitting: Internal Medicine

## 2021-12-22 ENCOUNTER — Encounter: Payer: Self-pay | Admitting: Internal Medicine

## 2021-12-25 ENCOUNTER — Telehealth: Payer: Self-pay

## 2021-12-25 DIAGNOSIS — R1084 Generalized abdominal pain: Secondary | ICD-10-CM

## 2021-12-25 DIAGNOSIS — K921 Melena: Secondary | ICD-10-CM

## 2021-12-25 NOTE — Telephone Encounter (Signed)
Pt had a colonoscopy done on 12/15/21. Pt has been having dark, tarry stools since 12/18/21. Stools are solid. Pt is having lower abdominal pain, nausea and cold chills. Pt wanted to wait to see if this resolved, but it hasn't. Please advise.

## 2021-12-26 ENCOUNTER — Telehealth: Payer: Self-pay

## 2021-12-26 NOTE — Telephone Encounter (Signed)
[name], MD  You 11 hours ago (8:24 PM)   But the patient know that black is usually from old blood higher up in the colon.  It also be seen if he is taking iron or Pepto-Bismol.  If he is not taking iron or Pepto-Bismol then he should have a blood count checked to make sure he is not bleeding from something in the upper GI tract.  If his blood count is low and or he is not taking any Pepto-Bismol or iron then he should be set up for an upper endoscopy.  The removal of the 3 small polyps should not cause black stools.

## 2021-12-26 NOTE — Telephone Encounter (Signed)
Pt states he has not been taking any iron supplements or Pepto Bismol... Pt already has an EGD and ERCP scheduled with [facility] 01/08/22... Pt will come in for CBC tomorrow in the meantime

## 2021-12-26 NOTE — Telephone Encounter (Signed)
Patient left a voicemail and states he does not know if he really needs blood work because he is no longer having pain

## 2021-12-27 NOTE — Telephone Encounter (Signed)
Called pt lmovm 

## 2021-12-31 ENCOUNTER — Encounter: Payer: Self-pay | Admitting: Emergency Medicine

## 2021-12-31 ENCOUNTER — Other Ambulatory Visit: Payer: Self-pay

## 2021-12-31 ENCOUNTER — Emergency Department
Admission: EM | Admit: 2021-12-31 | Discharge: 2021-12-31 | Disposition: A | Discharge status: Home / Self Care | Payer: Medicare Other | Attending: Emergency Medicine | Admitting: Emergency Medicine

## 2021-12-31 DIAGNOSIS — S61412A Laceration without foreign body of left hand, initial encounter: Secondary | ICD-10-CM | POA: Insufficient documentation

## 2021-12-31 DIAGNOSIS — S6992XA Unspecified injury of left wrist, hand and finger(s), initial encounter: Secondary | ICD-10-CM | POA: Diagnosis present

## 2021-12-31 DIAGNOSIS — E119 Type 2 diabetes mellitus without complications: Secondary | ICD-10-CM | POA: Insufficient documentation

## 2021-12-31 DIAGNOSIS — W260XXA Contact with knife, initial encounter: Secondary | ICD-10-CM | POA: Insufficient documentation

## 2021-12-31 MED ORDER — CEPHALEXIN 500 MG PO CAPS: 500.0000 mg | ORAL_CAPSULE | Freq: Two times a day (BID) | ORAL | 0 refills | Status: DC

## 2021-12-31 MED ORDER — LIDOCAINE HCL (PF) 1 % IJ SOLN
5.0000 mL | Freq: Once | INTRAMUSCULAR | Status: DC
  Filled 2021-12-31: qty 5

## 2021-12-31 NOTE — ED Triage Notes (Signed)
Pt presents to the ED for laceration to L palm. Pt states he cut it on a kitchen knife today. Last tetanus was within in the 5 years. Bleeding controlled. Pt is A&Ox4 and NAD.

## 2021-12-31 NOTE — Discharge Instructions (Addendum)
-  Take all of antibiotics as prescribed. -Return to the emergency department, urgent care, or your PCP for suture removal in 10 days. -Return to the emergency department at any time if you begin to experience any new or worsening symptoms. -Avoid significant movements with your hand to prevent suture rupture.

## 2021-12-31 NOTE — ED Provider Notes (Signed)
[facility] Provider Note    None    (approximate)   History   Chief Complaint Laceration   HPI Shaun Odonnell is a 56 y.o. male, history of pancreatitis, bipolar depression, diabetes, tobacco abuse, type 2 diabetes, schizoaffective disorder, presents to the emergency department for evaluation of laceration.  Patient states that he was cutting cabbage earlier today with a kitchen knife when he accidentally cut the palmar aspect of his right hand.  Bleeding controlled on scene with direct pressure.  Denies any numbness/tingling in his fingers.  He is up-to-date on his tetanus.  Denies fever/chills, wrist pain, forearm pain, nail injury, or rashes/lesions.  History Limitations: No limitations.      Physical Exam  Triage Vital Signs: ED Triage Vitals  Enc Vitals Group     BP 12/31/21 1514 (!) 145/79     Pulse Rate 12/31/21 1514 75     Resp 12/31/21 1514 18     Temp 12/31/21 1514 97.9 F (36.6 C)     Temp Source 12/31/21 1514 Oral     SpO2 12/31/21 1514 98 %     Weight 12/31/21 1511 185 lb (83.9 kg)     Height 12/31/21 1511 5\' 7"  (1.702 m)     Head Circumference --      Peak Flow --      Pain Score --      Pain Loc --      Pain Edu? --      Excl. in [place]? --     Most recent vital signs: Vitals:   12/31/21 1514  BP: (!) 145/79  Pulse: 75  Resp: 18  Temp: 97.9 F (36.6 C)  SpO2: 98%    General: Awake, NAD.  CV: Good peripheral perfusion.  Resp: Normal effort.  Abd: Soft, non-tender. No distention.  Neuro: At baseline. No gross neurological deficits. Other: 2 cm linear laceration on the thenar eminence of the right hand.  No active bleeding or discharge.  No foreign bodies noted.  Cap refill less than 2 in distal thumb.  Sensation intact in all digits.  Physical Exam    ED Results / Procedures / Treatments  Labs (all labs ordered are listed, but only abnormal results are displayed) Labs Reviewed - No data to display   EKG Not  applicable.   RADIOLOGY  ED Provider Interpretation: Not applicable.  No results found.  PROCEDURES:  Critical Care performed: None.  Procedures    MEDICATIONS ORDERED IN ED: Medications - No data to display   IMPRESSION / MDM / [place] / ED COURSE  I reviewed the triage vital signs and the nursing notes.                              Shaun Odonnell is a 56 y.o. male, history of pancreatitis, bipolar depression, diabetes, tobacco abuse, type 2 diabetes, schizoaffective disorder, presents to the emergency department for evaluation of laceration.  Patient states that he was cutting cabbage earlier today with a kitchen knife when he accidentally cut the palmar aspect of his right hand.  Bleeding controlled on scene with direct pressure.  Differential diagnosis includes, but is not limited to, laceration, metacarpal fracture, nerve injury, vascular injury.  ED Course Patient appears well.  Vital signs within normal limits.  NAD.  2 cm laceration present.  No active bleeding or discharge.  Wound anesthetized and cleansed with copious irrigation.  See above for repair details.  Patient tolerated the procedure well.  Assessment/Plan Presentation consistent with uncomplicated laceration to the thenar eminence of the left hand.  Exam not suggestive of underlying fracture, tendon injury, or significant vascular injury.  Patient tolerated repair well.  Given the location of the injury and the mechanism, we will go ahead and cover for infection with cephalexin.  No other illnesses or injuries identified.  We will plan to discharge this patient.  Patient was provided with anticipatory guidance, return precautions, and educational material. Encouraged the patient to return to the emergency department at any time if they begin to experience any new or worsening symptoms.       FINAL CLINICAL IMPRESSION(S) / ED DIAGNOSES   Final diagnoses:  Laceration of left hand, foreign  body presence unspecified, initial encounter     Rx / DC Orders   ED Discharge Orders          Ordered    cephALEXin (KEFLEX) 500 MG capsule  2 times daily        12/31/21 1658             Note:  This document was prepared using Dragon voice recognition software and may include unintentional dictation errors.   Teodoro Spray, [place] 12/31/21 2336    Nena Polio, MD 01/01/22 [phone]

## 2022-01-01 ENCOUNTER — Encounter: Payer: Self-pay | Admitting: Internal Medicine

## 2022-01-01 ENCOUNTER — Telehealth: Payer: Self-pay | Admitting: Internal Medicine

## 2022-01-01 NOTE — Telephone Encounter (Signed)
See patient MyChart message regarding recent injury in his hand, patient needs Tylenol 3 with codeine. Needs something stronger to treat the pain. Cannot take ibuprofen because he has an ERCP scheduled for Monday.   [place], [place] [address]  Phone: [phone] Fax: [phone]

## 2022-01-01 NOTE — Telephone Encounter (Signed)
Patient called in very upset due to the My Chart response from Dr Neomia Dear CMA he is asking if Dr Neomia Dear can please call him at her earliest convenience. Can be reached at Ph# [phone]

## 2022-01-01 NOTE — Telephone Encounter (Signed)
Needs to take otc ibuprufen q 8 hrly altrnate with tylenol for pain. Pl let pt know no pain meds per clinic policy. Thnx.

## 2022-01-02 ENCOUNTER — Telehealth: Payer: Self-pay | Admitting: *Deleted

## 2022-01-02 NOTE — Telephone Encounter (Signed)
Patient stated he was in pain and wanted pain medication due to fact that he was unable to take ibuprofen.   I message was answered back to patient from yesterday that this was not practice policy.

## 2022-01-02 NOTE — Telephone Encounter (Signed)
Transition Care Management Follow-up Telephone Call Date of discharge and from where: [facility] 12-31-2021 How have you been since you were released from the hospital? Feeling a little better with some pain. Any questions or concerns? No  Items Reviewed: Did the pt receive and understand the discharge instructions provided? Yes  Medications obtained and verified? Yes  Other? No  Any new allergies since your discharge? No  Dietary orders reviewed? No Do you have support at home? Yes   Home Care and Equipment/Supplies: Were home health services ordered? not applicable If so, what is the name of the agency?   Has the agency set up a time to come to the patient's home? not applicable Were any new equipment or medical supplies ordered?  No What is the name of the medical supply agency?  Were you able to get the supplies/equipment? not applicable Do you have any questions related to the use of the equipment or supplies? No  Functional Questionnaire: (I = Independent and D = Dependent) ADLs: I  Bathing/Dressing- I  Meal Prep- I  Eating- I  Maintaining continence- I  Transferring/Ambulation- I  Managing Meds- I  Follow up appointments reviewed:  PCP Hospital f/u appt confirmed? Yes  Scheduled to see 01-10-2022 on  @ 9:00. [facility] f/u appt confirmed? No  Scheduled to see Are transportation arrangements needed? No  If their condition worsens, is the pt aware to call PCP or go to the Emergency Dept.? No Was the patient provided with contact information for the PCP's office or ED? No Was to pt encouraged to call back with questions or concerns? No

## 2022-01-05 ENCOUNTER — Other Ambulatory Visit: Payer: Self-pay

## 2022-01-05 ENCOUNTER — Ambulatory Visit (INDEPENDENT_AMBULATORY_CARE_PROVIDER_SITE_OTHER): Payer: Medicare Other | Admitting: Endocrinology

## 2022-01-05 VITALS — BP 128/84 | HR 65 | Ht 67.0 in | Wt 185.6 lb

## 2022-01-05 DIAGNOSIS — J441 Chronic obstructive pulmonary disease with (acute) exacerbation: Secondary | ICD-10-CM

## 2022-01-05 DIAGNOSIS — E1165 Type 2 diabetes mellitus with hyperglycemia: Secondary | ICD-10-CM

## 2022-01-05 LAB — POCT GLYCOSYLATED HEMOGLOBIN (HGB A1C): Hemoglobin A1C: 10.8 % — AB (ref 4.0–5.6)

## 2022-01-05 MED ORDER — LANTUS SOLOSTAR 100 UNIT/ML ~~LOC~~ SOPN: 30.0000 [IU] | PEN_INJECTOR | SUBCUTANEOUS | 2 refills | Status: DC

## 2022-01-05 MED ORDER — DEXCOM G6 TRANSMITTER MISC: 1.0000 | Freq: Once | 1 refills | Status: AC

## 2022-01-05 MED ORDER — DEXCOM G6 SENSOR MISC: 1.0000 | 3 refills | Status: DC

## 2022-01-05 NOTE — Patient Instructions (Addendum)
good diet and exercise significantly improve the control of your diabetes.  please let me know if you wish to be referred to a dietician.  high blood sugar is very risky to your health.  you should see an eye doctor and dentist every year.  It is very important to get all recommended vaccinations.  ?Controlling your blood pressure and cholesterol drastically reduces the damage diabetes does to your body.  Those who smoke should quit.  Please discuss these with your doctor.  ?I have sent a prescription to your pharmacy, for the continuous glucose monitor. ?We will need to take this complex situation in stages. ?For now, please stop taking the Trulicity, and: ?Please continue the same other medications. ?Please come back for a follow-up appointment in 1 month.  ?

## 2022-01-05 NOTE — Progress Notes (Signed)
Subjective:    Patient ID: Shaun Odonnell, male    DOB: 06/29/1966, 56 y.o.   MRN: [id number]  HPI pt is referred by Dr Neomia Dear, for diabetes.  Pt states DM was dx'ed in 7829; it is complicated by PN and PAD; he has been on insulin since 2018; pt says his diet is improved, but exercise is not good; he has never had pancreatic surgery, severe hypoglycemia or DKA.  He has chronic pancreatitis.   He also takes [employer] and [place].  He says Lantus is 30 units each morning, and none in the evening.  He brings a record of his cbg's which I have reviewed today.  Cbg varies from 99-433.  There is no trend throughout the day.  He eats meals at 8AM, 1PM, and 5PM.   Past Medical History:  Diagnosis Date   Alcohol abuse 04/29/2019   Allergy    Anxiety    ARDS (adult respiratory distress syndrome) (HCC)    Bipolar disorder (HCC)    Cataract    Chronic kidney disease    per pt - no current problems   COPD (chronic obstructive pulmonary disease) (HCC)    chronic cough and wheezing   Depression    Diabetes mellitus without complication (HCC)    type 2   Dizziness    medication related   Dyspnea    with exertion   Elevated lipids    Fatty liver    Hypercholesterolemia    Hypertension    per pt, no current prob-no meds   Pancreatitis    Pneumonia    in past   Pulmonary embolism ([place])    Schizoaffective disorder ([place])     Past Surgical History:  Procedure Laterality Date   BIOPSY  08/31/2021   Procedure: BIOPSY;  Surgeon: Irving Copas., MD;  Location: [place];  Service: Gastroenterology;;   CATARACT EXTRACTION W/PHACO Right 03/26/2018   Procedure: CATARACT EXTRACTION PHACO AND INTRAOCULAR LENS PLACEMENT ([place]) RIGHT BORDERLINE DIABETIC;  Surgeon: Leandrew Koyanagi, MD;  Location: [place];  Service: Ophthalmology;  Laterality: Right;   CATARACT EXTRACTION W/PHACO Left 04/23/2018   Procedure: CATARACT EXTRACTION PHACO AND INTRAOCULAR LENS PLACEMENT ([place])  LEFT  BORDERLINE DIABETIC;  Surgeon: Leandrew Koyanagi, MD;  Location: [place];  Service: Ophthalmology;  Laterality: Left;   COLONOSCOPY     COLONOSCOPY WITH PROPOFOL N/A 08/21/2017   Procedure: COLONOSCOPY WITH PROPOFOL;  Surgeon: Manya Silvas, MD;  Location: [facility];  Service: Endoscopy;  Laterality: N/A;   COLONOSCOPY WITH PROPOFOL N/A 12/15/2021   Procedure: COLONOSCOPY WITH BIOPSY;  Surgeon: [name], MD;  Location: [place];  Service: Endoscopy;  Laterality: N/A;  Diabetic   ENDOSCOPIC RETROGRADE CHOLANGIOPANCREATOGRAPHY (ERCP) WITH PROPOFOL N/A 08/31/2021   Procedure: ENDOSCOPIC RETROGRADE CHOLANGIOPANCREATOGRAPHY (ERCP) WITH PROPOFOL;  Surgeon: Rush Landmark Telford Nab., MD;  Location: [place];  Service: Gastroenterology;  Laterality: N/A;   ESOPHAGOGASTRODUODENOSCOPY (EGD) WITH PROPOFOL N/A 08/31/2021   Procedure: ESOPHAGOGASTRODUODENOSCOPY (EGD) WITH PROPOFOL;  Surgeon: Rush Landmark Telford Nab., MD;  Location: [place];  Service: Gastroenterology;  Laterality: N/A;   EYE SURGERY     FINE NEEDLE ASPIRATION N/A 08/31/2021   Procedure: FINE NEEDLE ASPIRATION (FNA) LINEAR;  Surgeon: Irving Copas., MD;  Location: [place];  Service: Gastroenterology;  Laterality: N/A;   HERNIA REPAIR     inguinal and umbilical   POLYPECTOMY N/A 12/15/2021   Procedure: POLYPECTOMY;  Surgeon: [name], MD;  Location: [place];  Service: Endoscopy;  Laterality: N/A;  RIB RESECTION N/A 08/20/2019   Procedure: removal of xyphoid process;  Surgeon: Nestor Lewandowsky, MD;  Location: [facility];  Service: Thoracic;  Laterality: N/A;   SPHINCTEROTOMY  08/31/2021   Procedure: Joan Mayans;  Surgeon: Mansouraty, Telford Nab., MD;  Location: [place];  Service: Gastroenterology;;   UMBILICAL HERNIA REPAIR N/A 06/22/2019   Procedure: OPEN HERNIA REPAIR UMBILICAL ADULT;  Surgeon: Olean Ree, MD;  Location: [facility];  Service: General;  Laterality:  N/A;   UPPER ESOPHAGEAL ENDOSCOPIC ULTRASOUND (EUS) N/A 08/31/2021   Procedure: UPPER ESOPHAGEAL ENDOSCOPIC ULTRASOUND (EUS);  Surgeon: Irving Copas., MD;  Location: [place];  Service: Gastroenterology;  Laterality: N/A;   UPPER GI ENDOSCOPY      Social History   Socioeconomic History   Marital status: Single    Spouse name: Not on file   Number of children: 0   Years of education: Not on file   Highest education level: Bachelor's degree (e.g., BA, AB, BS)  Occupational History   Occupation: disabled  Tobacco Use   Smoking status: Every Day    Packs/day: 2.00    Years: 34.00    Pack years: 68.00    Types: Cigarettes   Smokeless tobacco: Former    Types: Snuff    Quit date: 1990   Tobacco comments:    Patient currently vapes and now smokes .5 or less daily.   Vaping Use   Vaping Use: Every day   Substances: Nicotine, Flavoring  Substance and Sexual Activity   Alcohol use: Yes    Alcohol/week: 6.0 standard drinks    Types: 6 Cans of beer per week    Comment: 3 beers 2x a week   Drug use: Not Currently   Sexual activity: Not Currently  Other Topics Concern   Not on file  Social History Narrative   Not on file   Social Determinants of Health   Financial Resource Strain: Low Risk    Difficulty of Paying Living Expenses: Not hard at all  Food Insecurity: No Food Insecurity   Worried About Charity fundraiser in the Last Year: Never true   Ran Out of Food in the Last Year: Never true  Transportation Needs: No Transportation Needs   Lack of Transportation (Medical): No   Lack of Transportation (Non-Medical): No  Physical Activity: Inactive   Days of Exercise per Week: 0 days   Minutes of Exercise per Session: 0 min  Stress: No Stress Concern Present   Feeling of Stress : Not at all  Social Connections: Moderately Isolated   Frequency of Communication with Friends and Family: More than three times a week   Frequency of Social Gatherings with Friends  and Family: Three times a week   Attends Religious Services: More than 4 times per year   Active Member of Clubs or Organizations: No   Attends Archivist Meetings: Never   Marital Status: Never married  Human resources officer Violence: Not At Risk   Fear of Current or Ex-Partner: No   Emotionally Abused: No   Physically Abused: No   Sexually Abused: No    Current Outpatient Medications on File Prior to Visit  Medication Sig Dispense Refill   acetaminophen (TYLENOL) 500 MG tablet Take 1 tablet (500 mg total) by mouth every 6 (six) hours as needed. 30 tablet 0   albuterol (PROVENTIL) (2.5 MG/3ML) 0.083% nebulizer solution Take 3 mLs (2.5 mg total) by nebulization every 6 (six) hours as needed for wheezing or shortness of breath. [place]  mL 5   albuterol (VENTOLIN HFA) 108 (90 Base) MCG/ACT inhaler INHALE 2 PUFFS BY MOUTH EVERY 6 HOURS ASNEEDED WHEEZING/ SHORTNESS OF BREATH 8.5 g 1   ALPRAZolam (XANAX) 0.5 MG tablet Take 0.5 mg by mouth 4 (four) times daily. Patient takes when wakes up(~0600)/ 1000/ 1600/ 2000     budesonide-formoterol (SYMBICORT) 80-4.5 MCG/ACT inhaler Inhale 2 puffs into the lungs in the morning and at bedtime. 1 each 12   carboxymethylcellulose (REFRESH PLUS) 0.5 % SOLN Place 2 drops into both eyes daily as needed (Dry eyes).     cephALEXin (KEFLEX) 500 MG capsule Take 1 capsule (500 mg total) by mouth 2 (two) times daily for 10 days. 20 capsule 0   diclofenac Sodium (VOLTAREN) 1 % GEL Apply 2 g topically 4 (four) times daily. 2 g 2   eszopiclone (LUNESTA) 2 MG TABS tablet Take 2 mg by mouth at bedtime as needed.     fenofibrate (TRICOR) 145 MG tablet Take 1 tablet (145 mg total) by mouth daily. 30 tablet 2   glucose blood test strip Use as instructed 200 each 12   ibuprofen (ADVIL) 400 MG tablet Take 1 tablet (400 mg total) by mouth every 6 (six) hours as needed. 30 tablet 0   JARDIANCE 25 MG TABS tablet TAKE 1 TABLET BY MOUTH ONCE DAILY BEFOREBREAKFAST 30 tablet 2    lipase/protease/amylase (CREON) 36000 UNITS CPEP capsule Take 2 capsules (72,000 Units total) by mouth 3 (three) times daily with meals. May also take 1 capsule (36,000 Units total) as needed (with snacks). 240 capsule 11   OLANZapine (ZYPREXA) 20 MG tablet Take 20 mg by mouth at bedtime.     omeprazole (PRILOSEC) 40 MG capsule Take 1 capsule (40 mg total) by mouth daily. 30 capsule 6   ondansetron (ZOFRAN-ODT) 8 MG disintegrating tablet Take 1 tablet (8 mg total) by mouth every 8 (eight) hours as needed for nausea or vomiting. 20 tablet 0   OneTouch Delica Lancets 82U MISC USE TO CHECK FASTING SUGAR TWICE DAILY 100 each 1   sertraline (ZOLOFT) 100 MG tablet Take 100 mg by mouth daily.      simvastatin (ZOCOR) 80 MG tablet TAKE 1 TABLET BY MOUTH AT BEDTIME 90 tablet 0   ULTRACARE PEN NEEDLES 32G X 4 MM MISC USE ONCE DAILY WITH INSULIN 90 each 1   No current facility-administered medications on file prior to visit.    Allergies  Allergen Reactions   Fentanyl Nausea And Vomiting   Morphine And Related Shortness Of Breath   Clonazepam Other (See Comments)    Other reaction(s): "Dystonic"; alprazolam is a home med Dystonic Reaction   Pimozide Other (See Comments)    Other reaction(s): "Distonic" Dystonic Reaction   Trifluoperazine Other (See Comments)    Other (See Comments) "Distonic" Dystonic Reaction   Azithromycin Other (See Comments)    Was told not to take Z pak due to his heart   Montelukast     headache, nausea, feeling a little anxious, panic   Morphine Other (See Comments)    Other reaction(s): " I stop breathing"; can take Diluadid Cnnot tolerate d/t Drug Metabolism   Tramadol Nausea Only    Family History  Problem Relation Age of Onset   Hyperlipidemia Mother    Hypertension Father    Diabetes Maternal Aunt    Dementia Maternal Grandmother    Heart disease Maternal Grandfather    Heart disease Paternal Grandmother    Stroke Paternal Grandfather  Diabetes  Paternal Grandfather    Colon cancer Neg Hx    Esophageal cancer Neg Hx    Inflammatory bowel disease Neg Hx    Liver disease Neg Hx    Pancreatic cancer Neg Hx    Stomach cancer Neg Hx    Rectal cancer Neg Hx     BP 128/84    Pulse 65    Ht 5\' 7"  (1.702 m)    Wt 185 lb 9.6 oz (84.2 kg)    SpO2 99%    BMI 29.07 kg/m     Review of Systems denies weight loss, sob, n/v, and memory loss.    Objective:   Physical Exam VITAL SIGNS:  See vs page GENERAL: no distress Pulses: dorsalis pedis intact bilat.   MSK: no deformity of the feet CV: no leg edema Skin:  no ulcer on the feet.  normal color and temp on the feet.  Neuro: sensation is intact to touch on the feet.    Lab Results  Component Value Date   HGBA1C 10.8 (A) 01/05/2022   Lab Results  Component Value Date   CREATININE 0.64 11/11/2021   BUN 12 11/11/2021   NA 134 (L) 11/11/2021   K 3.9 11/11/2021   CL 96 (L) 11/11/2021   CO2 29 11/11/2021   I have reviewed outside records, and summarized: Pt was noted to have elevated A1c, and referred here.  He has been receiving rx for pancreatitis, most recently due to panc duct stone.     Assessment & Plan:  DM, due to chronic pancreatitis: uncontrolled  Patient Instructions  good diet and exercise significantly improve the control of your diabetes.  please let me know if you wish to be referred to a dietician.  high blood sugar is very risky to your health.  you should see an eye doctor and dentist every year.  It is very important to get all recommended vaccinations.  Controlling your blood pressure and cholesterol drastically reduces the damage diabetes does to your body.  Those who smoke should quit.  Please discuss these with your doctor.  I have sent a prescription to your pharmacy, for the continuous glucose monitor. We will need to take this complex situation in stages. For now, please stop taking the Trulicity, and: Please continue the same other medications. Please  come back for a follow-up appointment in 1 month.

## 2022-01-06 ENCOUNTER — Other Ambulatory Visit: Payer: Self-pay | Admitting: Internal Medicine

## 2022-01-08 DIAGNOSIS — Z881 Allergy status to other antibiotic agents status: Secondary | ICD-10-CM | POA: Diagnosis not present

## 2022-01-08 DIAGNOSIS — Z885 Allergy status to narcotic agent status: Secondary | ICD-10-CM | POA: Diagnosis not present

## 2022-01-08 DIAGNOSIS — Z79899 Other long term (current) drug therapy: Secondary | ICD-10-CM | POA: Diagnosis not present

## 2022-01-08 DIAGNOSIS — K8689 Other specified diseases of pancreas: Secondary | ICD-10-CM | POA: Diagnosis not present

## 2022-01-08 DIAGNOSIS — E119 Type 2 diabetes mellitus without complications: Secondary | ICD-10-CM | POA: Diagnosis not present

## 2022-01-08 DIAGNOSIS — K861 Other chronic pancreatitis: Secondary | ICD-10-CM | POA: Diagnosis not present

## 2022-01-08 DIAGNOSIS — Z888 Allergy status to other drugs, medicaments and biological substances status: Secondary | ICD-10-CM | POA: Diagnosis not present

## 2022-01-08 DIAGNOSIS — F17200 Nicotine dependence, unspecified, uncomplicated: Secondary | ICD-10-CM | POA: Diagnosis not present

## 2022-01-08 DIAGNOSIS — Z794 Long term (current) use of insulin: Secondary | ICD-10-CM | POA: Diagnosis not present

## 2022-01-08 DIAGNOSIS — J449 Chronic obstructive pulmonary disease, unspecified: Secondary | ICD-10-CM | POA: Diagnosis not present

## 2022-01-08 NOTE — Telephone Encounter (Signed)
Requested Prescriptions  ?Pending Prescriptions Disp Refills  ?? fenofibrate (TRICOR) 145 MG tablet [Pharmacy Med Name: FENOFIBRATE 145 MG TAB] 90 tablet 0  ?  Sig: TAKE 1 TABLET BY MOUTH ONCE DAILY  ?  ? Cardiovascular:  Antilipid - Fibric Acid Derivatives Failed - 01/06/2022  3:09 PM  ?  ?  Failed - WBC in normal range and within 360 days  ?  WBC  ?Date Value Ref Range Status  ?11/11/2021 11.8 (H) 4.0 - 10.5 K/uL Final  ?   ?  ?  Failed - Lipid Panel in normal range within the last 12 months  ?  Cholesterol, Total  ?Date Value Ref Range Status  ?10/18/2021 247 (H) 100 - 199 mg/dL Final  ? ?Cholesterol  ?Date Value Ref Range Status  ?08/03/2014 140 0 - 200 mg/dL Final  ? ?Ldl Cholesterol, Calc  ?Date Value Ref Range Status  ?08/03/2014 57 0 - 100 mg/dL Final  ? ?LDL Chol Calc (NIH)  ?Date Value Ref Range Status  ?10/18/2021 125 (H) 0 - 99 mg/dL Final  ? ?HDL Cholesterol  ?Date Value Ref Range Status  ?08/03/2014 35 (L) 40 - 60 mg/dL Final  ? ?HDL  ?Date Value Ref Range Status  ?10/18/2021 41 >39 mg/dL Final  ? ?Triglycerides  ?Date Value Ref Range Status  ?10/18/2021 455 (H) 0 - 149 mg/dL Final  ?08/03/2014 241 (H) 0 - 200 mg/dL Final  ? ?  ?  ?  Passed - ALT in normal range and within 360 days  ?  ALT  ?Date Value Ref Range Status  ?11/11/2021 14 0 - 44 U/L Final  ? ?SGPT (ALT)  ?Date Value Ref Range Status  ?11/26/2014 75 (H) U/L Final  ?  Comment:  ?  14-63 ?NOTE: New Reference Range ?05/25/14 ?  ?   ?  ?  Passed - AST in normal range and within 360 days  ?  AST  ?Date Value Ref Range Status  ?11/11/2021 17 15 - 41 U/L Final  ? ?SGOT(AST)  ?Date Value Ref Range Status  ?11/26/2014 50 (H) 15 - 37 Unit/L Final  ?   ?  ?  Passed - Cr in normal range and within 360 days  ?  Creatinine  ?Date Value Ref Range Status  ?11/26/2014 0.79 0.60 - 1.30 mg/dL Final  ? ?Creatinine, Ser  ?Date Value Ref Range Status  ?11/11/2021 0.64 0.61 - 1.24 mg/dL Final  ?   ?  ?  Passed - HGB in normal range and within 360 days  ?   Hemoglobin  ?Date Value Ref Range Status  ?11/11/2021 16.2 13.0 - 17.0 g/dL Final  ?10/17/2021 17.4 13.0 - 17.7 g/dL Final  ?   ?  ?  Passed - HCT in normal range and within 360 days  ?  HCT  ?Date Value Ref Range Status  ?11/11/2021 47.2 39.0 - 52.0 % Final  ? ?Hematocrit  ?Date Value Ref Range Status  ?10/17/2021 51.5 (H) 37.5 - 51.0 % Final  ?   ?  ?  Passed - PLT in normal range and within 360 days  ?  Platelets  ?Date Value Ref Range Status  ?11/11/2021 217 150 - 400 K/uL Final  ?10/17/2021 198 150 - 450 x10E3/uL Final  ?   ?  ?  Passed - eGFR is 30 or above and within 360 days  ?  EGFR (African American)  ?Date Value Ref Range Status  ?11/26/2014 >60 >16m/min Final  ?06/15/2014 >60  Final  ? ?GFR calc Af Amer  ?Date Value Ref Range Status  ?12/13/2020 128 >59 mL/min/1.73 Final  ?  Comment:  ?  **In accordance with recommendations from the NKF-ASN Task force,** ?  [facility] is in the process of updating its eGFR calculation to the ?  2021 CKD-EPI creatinine equation that estimates kidney function ?  without a race variable. ?  ? ?EGFR (Non-African Amer.)  ?Date Value Ref Range Status  ?11/26/2014 >60 >48m/min Final  ?  Comment:  ?  eGFR values <646mmin/1.73 m2 may be an indication of chronic ?kidney disease (CKD). ?Calculated eGFR, using the MRDR Study equation, is useful in  ?patients with stable renal function. ?The eGFR calculation will not be reliable in acutely ill patients ?when serum creatinine is changing rapidly. It is not useful in ?patients on dialysis. The eGFR calculation may not be applicable ?to patients at the low and high extremes of body sizes, pregnant ?women, and vegetarians. ?  ?06/15/2014 >60  Final  ?  Comment:  ?  eGFR values <6052min/1.73 m2 may be an indication of chronic ?kidney disease (CKD). ?Calculated eGFR is useful in patients with stable renal function. ?The eGFR calculation will not be reliable in acutely ill patients ?when serum creatinine is changing rapidly. It is not  useful in  ?patients on dialysis. The eGFR calculation may not be applicable ?to patients at the low and high extremes of body sizes, pregnant ?women, and vegetarians. ?  ? ?GFR, Estimated  ?Date Value Ref Range Status  ?11/11/2021 >60 >60 mL/min Final  ?  Comment:  ?  (NOTE) ?Calculated using the CKD-EPI Creatinine Equation (2021) ?  ? ?eGFR  ?Date Value Ref Range Status  ?10/17/2021 107 >59 mL/min/1.73 Final  ?   ?  ?  Passed - Valid encounter within last 12 months  ?  Recent Outpatient Visits   ?      ? 4 weeks ago Diabetes mellitus without complication ([facility]? [facility]  ? 1 month ago Fluid level behind tympanic membrane of both ears  ? [facility]  ? 1 month ago Alcohol-induced acute pancreatitis, unspecified complication status  ? [facility]  ? 2 months ago Acute non-recurrent sinusitis, unspecified location  ? [facility] Vigg, Avanti, MD  ? 3 months ago Ketonuria  ? [facility] Vigg, Avanti, MD  ?  ?  ?Future Appointments   ?        ? In 2 days [facility]  ? [facility] 2 months [facility]  ?  ? ?  ?  ?  ? ? ?

## 2022-01-09 ENCOUNTER — Encounter: Payer: Self-pay | Admitting: Endocrinology

## 2022-01-09 ENCOUNTER — Other Ambulatory Visit: Payer: Self-pay | Admitting: Gastroenterology

## 2022-01-09 ENCOUNTER — Telehealth: Payer: Self-pay

## 2022-01-09 ENCOUNTER — Other Ambulatory Visit: Payer: Self-pay | Admitting: Internal Medicine

## 2022-01-09 DIAGNOSIS — R110 Nausea: Secondary | ICD-10-CM

## 2022-01-09 NOTE — Telephone Encounter (Signed)
Incoming call from pt and wanted to know if ellison would prescribe him some pain meds for his Chronic Pancreatitis.  He was unable to get in contact with the office that they referred him out to for pain meds. I told him that [facility] does not prescribe pt pain meds and that was out of the scop of his practice. He would need to F/U with his referral to pain meds or with his PCP. Pt understtod and thank me for calling him back so quickly. ?

## 2022-01-09 NOTE — Telephone Encounter (Signed)
Pt LVM and stated that he has surgery yesterday [facility] and his BS are over 200. He just started the Dexcom G6 today. BS was up to 346 and holding steady @ 272. ? ?Pt would like to know what to do to get BS down. ?

## 2022-01-09 NOTE — Telephone Encounter (Signed)
Patient has now been informed to take an extra 10 units of Lantus and will call us if sugars tend to stay high ?

## 2022-01-10 ENCOUNTER — Ambulatory Visit (INDEPENDENT_AMBULATORY_CARE_PROVIDER_SITE_OTHER): Payer: Medicare Other | Admitting: Internal Medicine

## 2022-01-10 ENCOUNTER — Encounter: Payer: Self-pay | Admitting: Gastroenterology

## 2022-01-10 ENCOUNTER — Other Ambulatory Visit: Payer: Self-pay

## 2022-01-10 ENCOUNTER — Encounter: Payer: Self-pay | Admitting: Internal Medicine

## 2022-01-10 ENCOUNTER — Other Ambulatory Visit: Payer: Self-pay | Admitting: Gastroenterology

## 2022-01-10 VITALS — BP 117/63 | HR 76 | Temp 98.1°F | Wt 186.0 lb

## 2022-01-10 DIAGNOSIS — Z4802 Encounter for removal of sutures: Secondary | ICD-10-CM | POA: Diagnosis not present

## 2022-01-10 MED ORDER — ONDANSETRON 8 MG PO TBDP: 8.0000 mg | ORAL_TABLET | Freq: Three times a day (TID) | ORAL | 0 refills | Status: DC | PRN

## 2022-01-10 NOTE — Progress Notes (Signed)
? ?BP 117/63   Pulse 76   Temp 98.1 ?F (36.7 ?C) (Oral)   Wt 186 lb (84.4 kg)   SpO2 94%   BMI 29.13 kg/m?   ? ?Subjective:  ? ? Patient ID: Shaun Odonnell, male    DOB: 10-Dec-1965, 56 y.o.   MRN: [id number] ? ?Chief Complaint  ?Patient presents with  ?? Suture / Staple Removal  ?  6 stiches in the palm of the L hand   ? ? ?HPI: ?Shaun Odonnell is a 56 y.o. male ? ?Suture / Staple Removal ?Treated in ED: sutures placed on 2/26 per ER on the palmar side of the right hand sene in the ER. He tried oral antibiotics (2 cm laceration present) since the wound repair. The treatment provided mild relief. There has been no drainage from the wound. There is no redness present. There is no swelling present. There is no pain present. He has no difficulty moving the affected extremity or digit.  ? ?Chief Complaint  ?Patient presents with  ?? Suture / Staple Removal  ?  6 stiches in the palm of the L hand   ? ? ?Relevant past medical, surgical, family and social history reviewed and updated as indicated. Interim medical history since our last visit reviewed. ?Allergies and medications reviewed and updated. ? ?Review of Systems ? ?Per HPI unless specifically indicated above ? ?   ?Objective:  ?  ?BP 117/63   Pulse 76   Temp 98.1 ?F (36.7 ?C) (Oral)   Wt 186 lb (84.4 kg)   SpO2 94%   BMI 29.13 kg/m?   ?Wt Readings from Last 3 Encounters:  ?01/10/22 186 lb (84.4 kg)  ?01/05/22 185 lb 9.6 oz (84.2 kg)  ?12/31/21 185 lb (83.9 kg)  ?  ?Physical Exam ?Vitals and nursing note reviewed.  ?Constitutional:   ?   General: He is not in acute distress. ?   Appearance: Normal appearance. He is not ill-appearing or diaphoretic.  ?Pulmonary:  ?   Breath sounds: No wheezing or rhonchi.  ?Chest:  ?   Chest wall: No tenderness.  ?Musculoskeletal:  ?   Cervical back: No rigidity or tenderness.  ?   Left lower leg: No edema.  ?Skin: ?   General: Skin is warm and dry.  ?   Coloration: Skin is not jaundiced or pale.  ?   Findings: Lesion  present. No bruising, erythema or rash.  ?Neurological:  ?   Mental Status: He is alert.  ? ? ?Results for orders placed or performed in visit on 01/05/22  ?POCT glycosylated hemoglobin (Hb A1C)  ?Result Value Ref Range  ? Hemoglobin A1C 10.8 (A) 4.0 - 5.6 %  ? HbA1c POC (<> result, manual entry)    ? HbA1c, POC (prediabetic range)    ? HbA1c, POC (controlled diabetic range)    ? ?   ? ? ?Current Outpatient Medications:  ??  acetaminophen (TYLENOL) 500 MG tablet, Take 1 tablet (500 mg total) by mouth every 6 (six) hours as needed., Disp: 30 tablet, Rfl: 0 ??  albuterol (PROVENTIL) (2.5 MG/3ML) 0.083% nebulizer solution, Take 3 mLs (2.5 mg total) by nebulization every 6 (six) hours as needed for wheezing or shortness of breath., Disp: 360 mL, Rfl: 5 ??  albuterol (VENTOLIN HFA) 108 (90 Base) MCG/ACT inhaler, INHALE 2 PUFFS BY MOUTH EVERY 6 HOURS ASNEEDED WHEEZING/ SHORTNESS OF BREATH, Disp: 8.5 g, Rfl: 1 ??  ALPRAZolam (XANAX) 0.5 MG tablet, Take 0.5 mg by mouth  4 (four) times daily. Patient takes when wakes up(~0600)/ 1000/ 1600/ 2000, Disp: , Rfl:  ??  budesonide-formoterol (SYMBICORT) 80-4.5 MCG/ACT inhaler, Inhale 2 puffs into the lungs in the morning and at bedtime., Disp: 1 each, Rfl: 12 ??  carboxymethylcellulose (REFRESH PLUS) 0.5 % SOLN, Place 2 drops into both eyes daily as needed (Dry eyes)., Disp: , Rfl:  ??  Continuous Blood Gluc Sensor (DEXCOM G6 SENSOR) MISC, 1 Device by Does not apply route See admin instructions. Change every 10 days, Disp: 9 each, Rfl: 3 ??  diclofenac Sodium (VOLTAREN) 1 % GEL, Apply 2 g topically 4 (four) times daily., Disp: 2 g, Rfl: 2 ??  eszopiclone (LUNESTA) 2 MG TABS tablet, Take 2 mg by mouth at bedtime as needed., Disp: , Rfl:  ??  fenofibrate (TRICOR) 145 MG tablet, TAKE 1 TABLET BY MOUTH ONCE DAILY, Disp: 90 tablet, Rfl: 0 ??  glucose blood test strip, Use as instructed, Disp: 200 each, Rfl: 12 ??  ibuprofen (ADVIL) 400 MG tablet, Take 1 tablet (400 mg total) by mouth  every 6 (six) hours as needed., Disp: 30 tablet, Rfl: 0 ??  insulin glargine (LANTUS SOLOSTAR) 100 UNIT/ML Solostar Pen, Inject 30 Units into the skin every morning. And pen needles 1/day (Patient taking differently: Inject 40 Units into the skin every morning. And pen needles 1/day), Disp: 30 mL, Rfl: 2 ??  JARDIANCE 25 MG TABS tablet, TAKE 1 TABLET BY MOUTH ONCE DAILY BEFOREBREAKFAST, Disp: 30 tablet, Rfl: 2 ??  lipase/protease/amylase (CREON) 36000 UNITS CPEP capsule, Take 2 capsules (72,000 Units total) by mouth 3 (three) times daily with meals. May also take 1 capsule (36,000 Units total) as needed (with snacks)., Disp: 240 capsule, Rfl: 11 ??  OLANZapine (ZYPREXA) 20 MG tablet, Take 20 mg by mouth at bedtime., Disp: , Rfl:  ??  omeprazole (PRILOSEC) 40 MG capsule, Take 1 capsule (40 mg total) by mouth daily., Disp: 30 capsule, Rfl: 6 ??  ondansetron (ZOFRAN-ODT) 8 MG disintegrating tablet, Take 1 tablet (8 mg total) by mouth every 8 (eight) hours as needed for nausea or vomiting., Disp: 20 tablet, Rfl: 0 ??  OneTouch Delica Lancets 37S MISC, USE TO CHECK FASTING SUGAR TWICE DAILY, Disp: 100 each, Rfl: 1 ??  sertraline (ZOLOFT) 100 MG tablet, Take 100 mg by mouth daily. , Disp: , Rfl:  ??  simvastatin (ZOCOR) 80 MG tablet, TAKE 1 TABLET BY MOUTH AT BEDTIME, Disp: 90 tablet, Rfl: 0 ??  ULTRACARE PEN NEEDLES 32G X 4 MM MISC, USE ONCE DAILY WITH INSULIN, Disp: 90 each, Rfl: 1  ? ? ?Assessment & Plan:  ?Patient presents for suture removal. The wound is well healed without signs of infection.  The sutures are removed after it was cleaned out with betadine x 2 and alcohol swabs.  Wound care and activity instructions given. Return prn.  ? ? ? ?

## 2022-01-11 ENCOUNTER — Telehealth: Payer: Self-pay

## 2022-01-11 ENCOUNTER — Encounter: Payer: Self-pay | Admitting: Endocrinology

## 2022-01-11 NOTE — Telephone Encounter (Signed)
Please see patient's MyChart message, for update on his care at [facility]. ?

## 2022-01-12 ENCOUNTER — Ambulatory Visit: Payer: Self-pay

## 2022-01-12 ENCOUNTER — Telehealth: Payer: Self-pay

## 2022-01-12 NOTE — Telephone Encounter (Signed)
I am sorry that he has had issues with the other pain management clinic that he has had previously. ?I will place his primary gastroenterologist on this as well. ?I think that any pain management clinic in [place] or [facility] is that is available is reasonable. ?Thank you. ?GM ?

## 2022-01-12 NOTE — Telephone Encounter (Signed)
Chart reviewed. ?Agree with his issues post ERCP at [facility], though he does suffer from chronic pancreatitis, he needs to further communicate with them as to next steps or may need additional/updated imaging. ?I have placed his primary GI, Dr. Allen Norris on this as well. ?GM ? ?
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
## 2022-01-12 NOTE — Telephone Encounter (Signed)
Patient called requesting Dr. Rush Landmark to referr him to [facility]. I asked if he had heard from the Pain clinic that his PCP had referred him to, and he said they called him and did an assessment over the phone. Stated they just wanted to do injections and other procedures, and he wants oral pain mgt. Please advise ?

## 2022-01-12 NOTE — Telephone Encounter (Signed)
Informed patient that he would need to reach out to his GI doctor that performed the Colonoscopy if he believes that he needs a new antibiotic if the one that was prescribed by them was making him ill. Patient verbalized understanding ?

## 2022-01-18 ENCOUNTER — Other Ambulatory Visit: Payer: Self-pay | Admitting: Surgery

## 2022-01-18 ENCOUNTER — Encounter: Payer: Self-pay | Admitting: Endocrinology

## 2022-01-18 NOTE — Telephone Encounter (Signed)
Night Nurse line documentation received that his sugars have been elevated with a reading of 445 last night. He is currently taking his medications as prescribed and denied any vision changes, nausea or vomiting. Please advise ?

## 2022-01-19 ENCOUNTER — Encounter: Payer: Self-pay | Admitting: Endocrinology

## 2022-01-19 ENCOUNTER — Other Ambulatory Visit: Payer: Self-pay

## 2022-01-20 ENCOUNTER — Other Ambulatory Visit: Payer: Self-pay | Admitting: Endocrinology

## 2022-01-20 MED ORDER — HUMULIN N KWIKPEN 100 UNIT/ML ~~LOC~~ SUPN: 80.0000 [IU] | PEN_INJECTOR | SUBCUTANEOUS | 3 refills | Status: DC

## 2022-01-22 ENCOUNTER — Ambulatory Visit: Payer: Self-pay

## 2022-01-22 ENCOUNTER — Ambulatory Visit: Payer: Medicare Other

## 2022-01-22 NOTE — Telephone Encounter (Signed)
Pt called for pain medication for ERCP pain due to a pancreatic stone in his pancreatic duct / but he is not Dr. Parks Ranger pt yet/ he stated he cant ask Dr. Neomia Dear which is his current PCP / he stated the last time he asked Dr. Neomia Dear gave him meds as a curtesy / he is taking OTC meds that is not helping / please advise  ? ? ?Chief Complaint: Abdominal pain from "pancreatic stone." Has new pt. Appointment with Dr. Parks Ranger 02/06/22. Asking for pain medication from him/ to be seen sooner. Explained he would need to be seen to be prescribed any medications. ?Symptoms: Pain above belly button ?Frequency: Started 2 weeks ago. Sees GI for this. ?Pertinent Negatives: Patient denies  ?Disposition: '[]'$ ED /'[]'$ Urgent Care (no appt availability in office) / '[]'$ Appointment(In office/virtual)/ '[]'$  [place] [facility]/ '[]'$ Home Care/ '[]'$ Refused Recommended Disposition /'[]'$ [place] [facility]/ '[]'$  Follow-up with PCP ?Additional Notes: Please advise pt.  ? ?Answer Assessment - Initial Assessment Questions ?1. LOCATION: "Where does it hurt?"  ?    Above belly button ?2. RADIATION: "Does the pain shoot anywhere else?" (e.g., chest, back) ?    No ?3. ONSET: "When did the pain begin?" (Minutes, hours or days ago)  ?    A couple of weeks ?4. SUDDEN: "Gradual or sudden onset?" ?    Sudden ?5. PATTERN "Does the pain come and go, or is it constant?" ?   - If constant: "Is it getting better, staying the same, or worsening?"  ?    (Note: Constant means the pain never goes away completely; most serious pain is constant and it progresses)  ?   - If intermittent: "How long does it last?" "Do you have pain now?" ?    (Note: Intermittent means the pain goes away completely between bouts) ?    Constant ?6. SEVERITY: "How bad is the pain?"  (e.g., Scale 1-10; mild, moderate, or severe) ?   - MILD (1-3): doesn't interfere with normal activities, abdomen soft and not tender to touch  ?   - MODERATE (4-7): interferes with normal activities or awakens  from sleep, abdomen tender to touch  ?   - SEVERE (8-10): excruciating pain, doubled over, unable to do any normal activities   ?    Moderate-severe ?7. RECURRENT SYMPTOM: "Have you ever had this type of stomach pain before?" If Yes, ask: "When was the last time?" and "What happened that time?"  ?    No ?8. CAUSE: "What do you think is causing the stomach pain?" ?    Pancreatic pain ?9. RELIEVING/AGGRAVATING FACTORS: "What makes it better or worse?" (e.g., movement, antacids, bowel movement) ?    No ?10. OTHER SYMPTOMS: "Do you have any other symptoms?" (e.g., back pain, diarrhea, fever, urination pain, vomiting) ?      No ? ?Protocols used: Abdominal Pain - Male-A-AH ? ?

## 2022-01-23 ENCOUNTER — Other Ambulatory Visit: Payer: Self-pay

## 2022-01-23 ENCOUNTER — Ambulatory Visit
Admission: RE | Admit: 2022-01-23 | Discharge: 2022-01-23 | Disposition: A | Discharge status: Home / Self Care | Payer: Medicare Other | Source: Ambulatory Visit | Attending: Surgery | Admitting: Surgery

## 2022-01-23 DIAGNOSIS — R109 Unspecified abdominal pain: Secondary | ICD-10-CM | POA: Diagnosis not present

## 2022-01-23 DIAGNOSIS — R1013 Epigastric pain: Secondary | ICD-10-CM | POA: Diagnosis not present

## 2022-01-23 DIAGNOSIS — I700 Atherosclerosis of aorta: Secondary | ICD-10-CM | POA: Diagnosis not present

## 2022-01-23 LAB — POCT I-STAT CREATININE: Creatinine, Ser: 0.8 mg/dL (ref 0.61–1.24)

## 2022-01-23 MED ORDER — IOHEXOL 300 MG/ML  SOLN
100.0000 mL | Freq: Once | INTRAMUSCULAR | Status: AC | PRN
Start: 2022-01-23 — End: 2022-01-23
  Administered 2022-01-23: 100 mL via INTRAVENOUS

## 2022-01-23 NOTE — Telephone Encounter (Signed)
Since he has not been seen by Dr. Raliegh Ip, he will need to call his current doctor until he is established with Dr Raliegh Ip.  ?

## 2022-01-24 ENCOUNTER — Encounter: Payer: Self-pay | Admitting: Endocrinology

## 2022-01-24 ENCOUNTER — Other Ambulatory Visit: Payer: Self-pay | Admitting: Endocrinology

## 2022-01-24 MED ORDER — HUMULIN 70/30 KWIKPEN (70-30) 100 UNIT/ML ~~LOC~~ SUPN: 100.0000 [IU] | PEN_INJECTOR | Freq: Every day | SUBCUTANEOUS | 3 refills | Status: DC

## 2022-01-25 ENCOUNTER — Encounter: Payer: Self-pay | Admitting: Endocrinology

## 2022-01-26 ENCOUNTER — Other Ambulatory Visit: Payer: Self-pay | Admitting: Surgery

## 2022-01-26 ENCOUNTER — Other Ambulatory Visit (HOSPITAL_COMMUNITY): Payer: Self-pay | Admitting: Surgery

## 2022-01-26 DIAGNOSIS — K750 Abscess of liver: Secondary | ICD-10-CM

## 2022-01-29 ENCOUNTER — Encounter: Payer: Self-pay | Admitting: Endocrinology

## 2022-01-30 ENCOUNTER — Encounter: Payer: Self-pay | Admitting: Endocrinology

## 2022-01-30 ENCOUNTER — Ambulatory Visit: Payer: Self-pay | Admitting: *Deleted

## 2022-01-30 NOTE — Telephone Encounter (Signed)
Spoke with patient and informed him that as he is well aware of that Dr. Neomia Dear will not prescribe pain medications and that if he is have some adverse side effects from his ERCP then he needs to call that specialist and to talk with them. Patient verbalized understanding.  ?

## 2022-01-30 NOTE — Telephone Encounter (Signed)
Summary: Requesting pain medication/infection in his liver duct  ? Pt stated he had an ERCP done a few weeks ago. Pt stated he now has an infection in his liver duct and is requesting pain medication. Pt stated [facility] suggested he call his PCP and request medication. Pt also mentioned he is currently on an antibiotic levaquin.  ? ?Pt seeking clinical advice.   ?  ? ?Reason for Disposition ? [1] Caller has URGENT medicine question about med that PCP or specialist prescribed AND [2] triager unable to answer question ? ?Answer Assessment - Initial Assessment Questions ?1. NAME of MEDICATION: "What medicine are you calling about?" ?    Patient is post procedure ERCP- patient has infection presently( CT scan)- he is being treated with antibiotic. Patient is requesting additional pain medication ?2. QUESTION: "What is your question?" (e.g., double dose of medicine, side effect) ?    Request for pain medication ?3. PRESCRIBING HCP: "Who prescribed it?" Reason: if prescribed by specialist, call should be referred to that group. ?    Patient had 5 days worth of pain medication ?4. SYMPTOMS: "Do you have any symptoms?" ?    Pain with infection ?5. SEVERITY: If symptoms are present, ask "Are they mild, moderate or severe?" ?    Pain rated 3-4 - he states it is getting better ?6. PREGNANCY:  "Is there any chance that you are pregnant?" "When was your last menstrual period?" ? ?Protocols used: Medication Question Call-A-AH ? ?

## 2022-01-30 NOTE — Telephone Encounter (Signed)
?  Chief Complaint: medication request ?Symptoms: abdominal pain ?Frequency:   ?Pertinent Negatives: Patient denies   ?Disposition: '[]'$ ED /'[]'$ Urgent Care (no appt availability in office) / '[]'$ Appointment(In office/virtual)/ '[]'$  [facility] [facility]/ '[]'$ Home Care/ '[x]'$ Refused Recommended Disposition /'[]'$ [place] [facility]/ '[]'$  Follow-up with PCP ?Additional Notes: Patient was advised to call PCP for help with his pain management. Patient was offered appointment with provider- but he declined. Patient states she can review the CT scan and see what the problem is- advised I would send message to provider- but she may still want to see him before treating him.   ?

## 2022-02-01 ENCOUNTER — Ambulatory Visit: Payer: Self-pay | Admitting: *Deleted

## 2022-02-01 ENCOUNTER — Ambulatory Visit: Payer: Medicare Other | Admitting: Endocrinology

## 2022-02-01 NOTE — Telephone Encounter (Signed)
?  Chief Complaint: left side pain requesting pain medication or something to help manage pain ?Symptoms: left  abdominal pain , left ribs, back area. Vomited x 1 today  ?Frequency: ongoing  ?Pertinent Negatives: Patient denies pain now  ?Disposition: '[]'$ ED /'[]'$ Urgent Care (no appt availability in office) / '[x]'$ Appointment(In office/virtual)/ '[]'$  [place] [facility]/ '[]'$ Home Care/ '[]'$ Refused Recommended Disposition /'[]'$ [place] [facility]/ '[]'$  Follow-up with PCP ?Additional Notes:  ? ?Requesting referral to pain clinic and would like to know what other options he has to manage pain. ? ? Reason for Disposition ? [1] MILD pain (e.g., does not interfere with normal activities) AND [2] pain comes and goes (cramps) [3] present > 48 hours  (Exception: this same abdominal pain is a chronic symptom recurrent or ongoing AND present > 4 weeks) ? ?Answer Assessment - Initial Assessment Questions ?1. LOCATION: "Where does it hurt?"  ?    Left side abdominal pain left back area ?2. RADIATION: "Does the pain shoot anywhere else?" (e.g., chest, back) ?    Chest , center of ribs  ?3. ONSET: "When did the pain begin?" (Minutes, hours or days ago)  ?    ongoing ?4. SUDDEN: "Gradual or sudden onset?" ?    na ?5. PATTERN "Does the pain come and go, or is it constant?" ?   - If constant: "Is it getting better, staying the same, or worsening?"  ?    (Note: Constant means the pain never goes away completely; most serious pain is constant and it progresses)  ?   - If intermittent: "How long does it last?" "Do you have pain now?" ?    (Note: Intermittent means the pain goes away completely between bouts) ?    Comes and goes  ?6. SEVERITY: "How bad is the pain?"  (e.g., Scale 1-10; mild, moderate, or severe) ?   - MILD (1-3): doesn't interfere with normal activities, abdomen soft and not tender to touch  ?   - MODERATE (4-7): interferes with normal activities or awakens from sleep, abdomen tender to touch  ?   - SEVERE (8-10): excruciating  pain, doubled over, unable to do any normal activities   ?    None now  ?7. RECURRENT SYMPTOM: "Have you ever had this type of stomach pain before?" If Yes, ask: "When was the last time?" and "What happened that time?"  ?    Yes has been seen in ED. ?8. CAUSE: "What do you think is causing the stomach pain?" ?    Not sure  ?9. RELIEVING/AGGRAVATING FACTORS: "What makes it better or worse?" (e.g., movement, antacids, bowel movement) ?    na ?10. OTHER SYMPTOMS: "Do you have any other symptoms?" (e.g., back pain, diarrhea, fever, urination pain, vomiting) ?      Vomiting x 1 ? ?Protocols used: Abdominal Pain - Male-A-AH ? ?

## 2022-02-02 ENCOUNTER — Ambulatory Visit: Payer: Medicare Other | Admitting: Internal Medicine

## 2022-02-02 NOTE — Telephone Encounter (Signed)
Copied from [place] [phone]. Topic: Referral - Status ?>> Feb 01, 2022  4:29 PM Yvette Rack wrote: ?Reason for CRM: Pt asked that a referral be sent to [facility] [place], [place], [place] [address] phone# [phone] ?

## 2022-02-06 ENCOUNTER — Ambulatory Visit: Payer: Medicare Other | Admitting: Family Medicine

## 2022-02-09 ENCOUNTER — Other Ambulatory Visit: Payer: Self-pay | Admitting: Internal Medicine

## 2022-02-09 ENCOUNTER — Encounter: Payer: Self-pay | Admitting: Internal Medicine

## 2022-02-09 DIAGNOSIS — E1169 Type 2 diabetes mellitus with other specified complication: Secondary | ICD-10-CM

## 2022-02-09 NOTE — Telephone Encounter (Signed)
Called pharm, pt filled new rx for 90 day supply today. ?Requested Prescriptions  ?Pending Prescriptions Disp Refills  ?? fenofibrate (TRICOR) 145 MG tablet [Pharmacy Med Name: FENOFIBRATE 145 MG TAB] 90 tablet 0  ?  Sig: TAKE 1 TABLET BY MOUTH ONCE DAILY  ?  ? Cardiovascular:  Antilipid - Fibric Acid Derivatives Failed - 02/09/2022  8:40 AM  ?  ?  Failed - WBC in normal range and within 360 days  ?  WBC  ?Date Value Ref Range Status  ?11/11/2021 11.8 (H) 4.0 - 10.5 K/uL Final  ?   ?  ?  Failed - Lipid Panel in normal range within the last 12 months  ?  Cholesterol, Total  ?Date Value Ref Range Status  ?10/18/2021 247 (H) 100 - 199 mg/dL Final  ? ?Cholesterol  ?Date Value Ref Range Status  ?08/03/2014 140 0 - 200 mg/dL Final  ? ?Ldl Cholesterol, Calc  ?Date Value Ref Range Status  ?08/03/2014 57 0 - 100 mg/dL Final  ? ?LDL Chol Calc (NIH)  ?Date Value Ref Range Status  ?10/18/2021 125 (H) 0 - 99 mg/dL Final  ? ?HDL Cholesterol  ?Date Value Ref Range Status  ?08/03/2014 35 (L) 40 - 60 mg/dL Final  ? ?HDL  ?Date Value Ref Range Status  ?10/18/2021 41 >39 mg/dL Final  ? ?Triglycerides  ?Date Value Ref Range Status  ?10/18/2021 455 (H) 0 - 149 mg/dL Final  ?08/03/2014 241 (H) 0 - 200 mg/dL Final  ? ?  ?  ?  Passed - ALT in normal range and within 360 days  ?  ALT  ?Date Value Ref Range Status  ?11/11/2021 14 0 - 44 U/L Final  ? ?SGPT (ALT)  ?Date Value Ref Range Status  ?11/26/2014 75 (H) U/L Final  ?  Comment:  ?  14-63 ?NOTE: New Reference Range ?05/25/14 ?  ?   ?  ?  Passed - AST in normal range and within 360 days  ?  AST  ?Date Value Ref Range Status  ?11/11/2021 17 15 - 41 U/L Final  ? ?SGOT(AST)  ?Date Value Ref Range Status  ?11/26/2014 50 (H) 15 - 37 Unit/L Final  ?   ?  ?  Passed - Cr in normal range and within 360 days  ?  Creatinine  ?Date Value Ref Range Status  ?11/26/2014 0.79 0.60 - 1.30 mg/dL Final  ? ?Creatinine, Ser  ?Date Value Ref Range Status  ?01/23/2022 0.80 0.61 - 1.24 mg/dL Final  ?   ?  ?   Passed - HGB in normal range and within 360 days  ?  Hemoglobin  ?Date Value Ref Range Status  ?11/11/2021 16.2 13.0 - 17.0 g/dL Final  ?10/17/2021 17.4 13.0 - 17.7 g/dL Final  ?   ?  ?  Passed - HCT in normal range and within 360 days  ?  HCT  ?Date Value Ref Range Status  ?11/11/2021 47.2 39.0 - 52.0 % Final  ? ?Hematocrit  ?Date Value Ref Range Status  ?10/17/2021 51.5 (H) 37.5 - 51.0 % Final  ?   ?  ?  Passed - PLT in normal range and within 360 days  ?  Platelets  ?Date Value Ref Range Status  ?11/11/2021 217 150 - 400 K/uL Final  ?10/17/2021 198 150 - 450 x10E3/uL Final  ?   ?  ?  Passed - eGFR is 30 or above and within 360 days  ?  EGFR (African American)  ?Date Value  Ref Range Status  ?11/26/2014 >60 >14m/min Final  ?06/15/2014 >60  Final  ? ?GFR calc Af AWyvonnia Lora ?Date Value Ref Range Status  ?12/13/2020 128 >59 mL/min/1.73 Final  ?  Comment:  ?  **In accordance with recommendations from the NKF-ASN Task force,** ?  [facility] is in the process of updating its eGFR calculation to the ?  2021 CKD-EPI creatinine equation that estimates kidney function ?  without a race variable. ?  ? ?EGFR (Non-African Amer.)  ?Date Value Ref Range Status  ?11/26/2014 >60 >658mmin Final  ?  Comment:  ?  eGFR values <6031min/1.73 m2 may be an indication of chronic ?kidney disease (CKD). ?Calculated eGFR, using the MRDR Study equation, is useful in  ?patients with stable renal function. ?The eGFR calculation will not be reliable in acutely ill patients ?when serum creatinine is changing rapidly. It is not useful in ?patients on dialysis. The eGFR calculation may not be applicable ?to patients at the low and high extremes of body sizes, pregnant ?women, and vegetarians. ?  ?06/15/2014 >60  Final  ?  Comment:  ?  eGFR values <69m23mn/1.73 m2 may be an indication of chronic ?kidney disease (CKD). ?Calculated eGFR is useful in patients with stable renal function. ?The eGFR calculation will not be reliable in acutely ill patients ?when  serum creatinine is changing rapidly. It is not useful in  ?patients on dialysis. The eGFR calculation may not be applicable ?to patients at the low and high extremes of body sizes, pregnant ?women, and vegetarians. ?  ? ?GFR, Estimated  ?Date Value Ref Range Status  ?11/11/2021 >60 >60 mL/min Final  ?  Comment:  ?  (NOTE) ?Calculated using the CKD-EPI Creatinine Equation (2021) ?  ? ?eGFR  ?Date Value Ref Range Status  ?10/17/2021 107 >59 mL/min/1.73 Final  ?   ?  ?  Passed - Valid encounter within last 12 months  ?  Recent Outpatient Visits   ?      ? 1 month [facility]  [facility]  ? 2 months ago Diabetes mellitus without complication [facility]HCC)[place] [facility]  ? 2 months ago Fluid level behind tympanic membrane of both ears  ? [facility]  ? 2 months ago Alcohol-induced acute pancreatitis, unspecified complication status  ? [facility]  ? 3 months ago Acute non-recurrent sinusitis, unspecified location  ? [facility] Vigg, Avanti, MD  ?  ?  ?Future Appointments   ?        ? In 1 month [facility]  ?  ? ?  ?  ?  ? ? ?

## 2022-02-09 NOTE — Telephone Encounter (Signed)
Patient is requesting refill of Simvastatin '80mg'$  ?

## 2022-02-12 MED ORDER — SIMVASTATIN 80 MG PO TABS: 80.0000 mg | ORAL_TABLET | Freq: Every day | ORAL | 0 refills | Status: DC

## 2022-02-15 ENCOUNTER — Encounter: Payer: Self-pay | Admitting: Endocrinology

## 2022-02-15 ENCOUNTER — Ambulatory Visit: Admission: RE | Admit: 2022-02-15 | Payer: Medicare Other | Source: Ambulatory Visit

## 2022-02-16 ENCOUNTER — Telehealth: Payer: Self-pay | Admitting: Internal Medicine

## 2022-02-16 NOTE — Telephone Encounter (Signed)
Received a call through team health regarding patient's concerns with hypoglycemia ? ? ?Spoke to the patient on 02/16/2022 at 5 AM ? ?Patient is currently on NPH every morning ? ? ?He is supposed to take 85 units every morning but due to hypoglycemia over the past week he had self reduced it to 65 units every morning ? ? ?Yesterday he took 60 units of NPH in the morning but 2 hours later his BG was >200 MG/DL and he added an additional 10 units ? ?By nighttime his BG was as low as 40 mg/DL ? ? ?Patient ate a full bag of candy which resulted in nausea and an episode of vomiting ? ? ? ?I have advised the patient to take NPH 65 units daily from now on ? ?He was also advised to avoid stacking his insulin ? ?Per his medication list currently it list Humulin 70/30 but the patient tells me this has been discontinued and he is currently on NPH only ? ? ?I have also encouraged the patient to do a fingerstick to confirm CGM readings ? ? ?We also reviewed proper way of correcting hypoglycemia ? ?HOW TO TREAT LOW BLOOD SUGARS (Blood sugar LESS THAN 70 MG/DL) ?Please follow the RULE OF 15 for the treatment of hypoglycemia treatment (when your (blood sugars are less than 70 mg/dL)  ? ?STEP 1: Take 15 grams of carbohydrates when your blood sugar is low, which includes:  ?3-4 GLUCOSE TABS  OR ?3-4 OZ OF JUICE OR REGULAR SODA OR ?  ? ?STEP 2: RECHECK blood sugar in 15 MINUTES ?STEP 3: If your blood sugar is still low at the 15 minute recheck --> then, go back to STEP 1 and treat AGAIN with another 15 grams of carbohydrates. ? ?

## 2022-02-19 ENCOUNTER — Telehealth: Payer: Self-pay | Admitting: Internal Medicine

## 2022-02-19 NOTE — Telephone Encounter (Signed)
Received a call from access nurse on 02/18/2022 around 1800 with the patient call regarding hypoglycemia BG 324 mg/DL ? ? ?The patient took 65 units of NPH every morning ? ?Patient had already called a few days prior with severe hypoglycemia ? ? ?Patient stated that he has NPH and Novolin mix ? ? ? ?I have advised the RN to contact the patient and advised him to avoid any sugar sweetened beverages, any sugar containing products, to encourage hydration and take 15-20-minute walk ? ?Patient to recheck BG at 8 PM, if BG> 300 mg/day may take 4 units of NPH ? ? ? ?Patient was advised to avoid mixing NPH and Novolin mix due to high risk of hypoglycemia and I am concerned that this has happened in the past ? ? ?Patient vies to contact the office and schedule a follow-up appointment with his endocrinologist ? ? ? ?Mack Guise, MD ? ?[facility]  ?[facility] ?[address] ?[place], [place] [address] ?Phone: [phone] ?FAX: [phone] ? ?

## 2022-02-22 ENCOUNTER — Ambulatory Visit
Admission: RE | Admit: 2022-02-22 | Discharge: 2022-02-22 | Disposition: A | Discharge status: Home / Self Care | Payer: Medicare Other | Source: Ambulatory Visit | Attending: Surgery | Admitting: Surgery

## 2022-02-22 ENCOUNTER — Encounter: Payer: Self-pay | Admitting: Internal Medicine

## 2022-02-22 DIAGNOSIS — K750 Abscess of liver: Secondary | ICD-10-CM | POA: Insufficient documentation

## 2022-02-22 DIAGNOSIS — I700 Atherosclerosis of aorta: Secondary | ICD-10-CM | POA: Diagnosis not present

## 2022-02-22 DIAGNOSIS — R109 Unspecified abdominal pain: Secondary | ICD-10-CM | POA: Diagnosis not present

## 2022-02-22 HISTORY — DX: Unspecified asthma, uncomplicated: J45.909

## 2022-02-22 MED ORDER — IOHEXOL 300 MG/ML  SOLN
100.0000 mL | Freq: Once | INTRAMUSCULAR | Status: AC | PRN
  Administered 2022-02-22: 100 mL via INTRAVENOUS

## 2022-03-05 ENCOUNTER — Encounter: Payer: Self-pay | Admitting: Endocrinology

## 2022-03-05 ENCOUNTER — Other Ambulatory Visit: Payer: Self-pay

## 2022-03-05 ENCOUNTER — Telehealth: Payer: Self-pay

## 2022-03-05 DIAGNOSIS — E1165 Type 2 diabetes mellitus with hyperglycemia: Secondary | ICD-10-CM

## 2022-03-05 MED ORDER — ULTRACARE PEN NEEDLES 32G X 4 MM MISC: 1 refills | Status: DC

## 2022-03-05 NOTE — Telephone Encounter (Signed)
Attempted to contact the patient regarding insulin adjustments and received no answer. Sent MyChart message ?

## 2022-03-05 NOTE — Telephone Encounter (Signed)
Patient called back regarding his high blood sugars and says that he has taken an extra 4 units (on top of his prescribed 65 units/day) to help bring his sugars down since they were still registering as "HIGH". Informed the patient to continue to drink plenty of water/fluids until further advice is received  ?

## 2022-03-07 MED ORDER — ULTRACARE PEN NEEDLES 32G X 4 MM MISC: 1 refills | Status: DC

## 2022-03-07 NOTE — Addendum Note (Signed)
Addended by: Sarina Ill on: 03/07/2022 03:48 PM ? ? Modules accepted: Orders ? ?

## 2022-03-09 ENCOUNTER — Telehealth: Payer: Self-pay | Admitting: Gastroenterology

## 2022-03-09 ENCOUNTER — Other Ambulatory Visit: Payer: Self-pay | Admitting: Internal Medicine

## 2022-03-09 NOTE — Telephone Encounter (Signed)
Patient made aware that Dr. Rush Landmark is currently out of the office & we will be in touch with recommendations once he has returned.  ?

## 2022-03-09 NOTE — Telephone Encounter (Signed)
Inbound call from patient stating that Dr. Rush Landmark referred him to [facility] to have a procedure done and he was able to have it completed. Patient is seeking advice if Dr. Rush Landmark is still wanting him to take  creon. Please advise.  ?

## 2022-03-09 NOTE — Telephone Encounter (Signed)
Requested Prescriptions  ?Pending Prescriptions Disp Refills  ?? JARDIANCE 25 MG TABS tablet [Pharmacy Med Name: JARDIANCE 25 MG TAB] 30 tablet 2  ?  Sig: TAKE 1 TABLET BY MOUTH ONCE DAILY BEFOREBREAKFAST  ?  ? Endocrinology:  Diabetes - SGLT2 Inhibitors Failed - 03/09/2022 10:06 AM  ?  ?  Failed - HBA1C is between 0 and 7.9 and within 180 days  ?  Hemoglobin A1C  ?Date Value Ref Range Status  ?01/05/2022 10.8 (A) 4.0 - 5.6 % Final  ?08/03/2014 5.7 4.2 - 6.3 % Final  ?  Comment:  ?  The American Diabetes Association recommends that a primary goal of ?therapy should be <7% and that physicians should reevaluate the ?treatment regimen in patients with HbA1c values consistently >8%. ?  ? ?HB A1C (BAYER DCA - WAIVED)  ?Date Value Ref Range Status  ?12/11/2021 11.9 (H) 4.8 - 5.6 % Final  ?  Comment:  ?           Prediabetes: 5.7 - 6.4 ?         Diabetes: >6.4 ?         Glycemic control for adults with diabetes: <7.0 ?  ?   ?  ?  Passed - Cr in normal range and within 360 days  ?  Creatinine  ?Date Value Ref Range Status  ?11/26/2014 0.79 0.60 - 1.30 mg/dL Final  ? ?Creatinine, Ser  ?Date Value Ref Range Status  ?01/23/2022 0.80 0.61 - 1.24 mg/dL Final  ?   ?  ?  Passed - eGFR in normal range and within 360 days  ?  EGFR (African American)  ?Date Value Ref Range Status  ?11/26/2014 >60 >16m/min Final  ?06/15/2014 >60  Final  ? ?GFR calc Af AWyvonnia Lora ?Date Value Ref Range Status  ?12/13/2020 128 >59 mL/min/1.73 Final  ?  Comment:  ?  **In accordance with recommendations from the NKF-ASN Task force,** ?  [facility] is in the process of updating its eGFR calculation to the ?  2021 CKD-EPI creatinine equation that estimates kidney function ?  without a race variable. ?  ? ?EGFR (Non-African Amer.)  ?Date Value Ref Range Status  ?11/26/2014 >60 >669mmin Final  ?  Comment:  ?  eGFR values <6038min/1.73 m2 may be an indication of chronic ?kidney disease (CKD). ?Calculated eGFR, using the MRDR Study equation, is useful in  ?patients  with stable renal function. ?The eGFR calculation will not be reliable in acutely ill patients ?when serum creatinine is changing rapidly. It is not useful in ?patients on dialysis. The eGFR calculation may not be applicable ?to patients at the low and high extremes of body sizes, pregnant ?women, and vegetarians. ?  ?06/15/2014 >60  Final  ?  Comment:  ?  eGFR values <63m32mn/1.73 m2 may be an indication of chronic ?kidney disease (CKD). ?Calculated eGFR is useful in patients with stable renal function. ?The eGFR calculation will not be reliable in acutely ill patients ?when serum creatinine is changing rapidly. It is not useful in  ?patients on dialysis. The eGFR calculation may not be applicable ?to patients at the low and high extremes of body sizes, pregnant ?women, and vegetarians. ?  ? ?GFR, Estimated  ?Date Value Ref Range Status  ?11/11/2021 >60 >60 mL/min Final  ?  Comment:  ?  (NOTE) ?Calculated using the CKD-EPI Creatinine Equation (2021) ?  ? ?eGFR  ?Date Value Ref Range Status  ?10/17/2021 107 >59 mL/min/1.73 Final  ?   ?  ?  Passed - Valid encounter within last 6 months  ?  Recent Outpatient Visits   ?      ? 1 month ago Visit for suture removal  ? [facility] Vigg, Avanti, MD  ? 2 months ago Diabetes mellitus without complication ([place])  ? [facility] Vigg, Avanti, MD  ? 3 months ago Fluid level behind tympanic membrane of both ears  ? [place], NP  ? 3 months ago Alcohol-induced acute pancreatitis, unspecified complication status  ? [facility] Vigg, Avanti, MD  ? 4 months ago Acute non-recurrent sinusitis, unspecified location  ? [facility] Vigg, Avanti, MD  ?  ?  ?Future Appointments   ?        ? In 4 days Vigg, Avanti, MD [facility]  ?  ? ?  ?  ?  ? ? ?

## 2022-03-11 NOTE — Telephone Encounter (Signed)
If he is not having issues with affording it, would continue taking it. ?Thanks. ?GM ?

## 2022-03-12 ENCOUNTER — Ambulatory Visit: Payer: Medicare Other | Admitting: Internal Medicine

## 2022-03-12 NOTE — Telephone Encounter (Signed)
The patient has been notified of this information and all questions answered.  ?The pt will continue creon as prescribed. ?

## 2022-03-13 ENCOUNTER — Encounter: Payer: Self-pay | Admitting: Internal Medicine

## 2022-03-13 ENCOUNTER — Ambulatory Visit (INDEPENDENT_AMBULATORY_CARE_PROVIDER_SITE_OTHER): Payer: Medicare Other | Admitting: Internal Medicine

## 2022-03-13 VITALS — BP 120/77 | HR 77 | Temp 99.1°F | Ht 67.01 in | Wt 212.4 lb

## 2022-03-13 DIAGNOSIS — G8929 Other chronic pain: Secondary | ICD-10-CM

## 2022-03-13 DIAGNOSIS — R102 Pelvic and perineal pain unspecified side: Secondary | ICD-10-CM

## 2022-03-13 DIAGNOSIS — R1011 Right upper quadrant pain: Secondary | ICD-10-CM

## 2022-03-13 LAB — URINALYSIS, ROUTINE W REFLEX MICROSCOPIC
Bilirubin, UA: NEGATIVE
Ketones, UA: NEGATIVE
Leukocytes,UA: NEGATIVE
Nitrite, UA: NEGATIVE
Protein,UA: NEGATIVE
RBC, UA: NEGATIVE
Specific Gravity, UA: <1.005 — ABNORMAL LOW (ref 1.005–1.030)
Urobilinogen, Ur: 0.2 mg/dL (ref 0.2–1.0)
pH, UA: 6 (ref 5.0–7.5)

## 2022-03-13 NOTE — Progress Notes (Signed)
? ?BP 120/77   Pulse 77   Temp 99.1 ?F (37.3 ?C) (Oral)   Ht 5' 7.01" (1.702 m)   Wt 212 lb 6.4 oz (96.3 kg)   SpO2 95%   BMI 33.26 kg/m?   ? ?Subjective:  ? ? Patient ID: Shaun Odonnell, male    DOB: [birth year], 56 y.o.   MRN: [id number] ? ?Chief Complaint  ?Patient presents with  ?? Diabetes  ?? Alcohol-induced acute pancreatitis  ?? ketonuria  ?? RLQ pain  ?  Started on Saturday. Happens after he eats  ? ? ?HPI: ?Shaun Odonnell is a 56 y.o. male ? ?Lower abdominal pain - RUQ pain / lower abdominal pain. Called his GI physicians and has a  ? ?GI Problem ?The primary symptoms include abdominal pain, nausea and diarrhea. Primary symptoms do not include fever, vomiting, melena, hematemesis, jaundice, hematochezia, dysuria, myalgias, arthralgias or rash.  ? ?Chief Complaint  ?Patient presents with  ?? Diabetes  ?? Alcohol-induced acute pancreatitis  ?? ketonuria  ?? RLQ pain  ?  Started on Saturday. Happens after he eats  ? ? ?Relevant past medical, surgical, family and social history reviewed and updated as indicated. Interim medical history since our last visit reviewed. ?Allergies and medications reviewed and updated. ? ?Review of Systems  ?Constitutional:  Negative for fever.  ?Gastrointestinal:  Positive for abdominal pain, diarrhea and nausea. Negative for hematemesis, hematochezia, jaundice, melena and vomiting.  ?Genitourinary:  Negative for dysuria.  ?Musculoskeletal:  Negative for arthralgias and myalgias.  ?Skin:  Negative for rash.  ? ?Per HPI unless specifically indicated above ? ?   ?Objective:  ?  ?BP 120/77   Pulse 77   Temp 99.1 ?F (37.3 ?C) (Oral)   Ht 5' 7.01" (1.702 m)   Wt 212 lb 6.4 oz (96.3 kg)   SpO2 95%   BMI 33.26 kg/m?   ?Wt Readings from Last 3 Encounters:  ?03/13/22 212 lb 6.4 oz (96.3 kg)  ?01/10/22 186 lb (84.4 kg)  ?01/05/22 185 lb 9.6 oz (84.2 kg)  ?  ?Physical Exam ?Vitals and nursing note reviewed.  ?Constitutional:   ?   General: He is not in acute distress. ?    Appearance: Normal appearance. He is not ill-appearing or diaphoretic.  ?HENT:  ?   Head: Normocephalic and atraumatic.  ?   Right Ear: Tympanic membrane and external ear normal. There is no impacted cerumen.  ?   Left Ear: External ear normal.  ?   Nose: No congestion or rhinorrhea.  ?   Mouth/Throat:  ?   Pharynx: No oropharyngeal exudate or posterior oropharyngeal erythema.  ?Eyes:  ?   Conjunctiva/sclera: Conjunctivae normal.  ?   Pupils: Pupils are equal, round, and reactive to light.  ?Cardiovascular:  ?   Rate and Rhythm: Normal rate and regular rhythm.  ?   Heart sounds: No murmur heard. ?  No friction rub. No gallop.  ?Pulmonary:  ?   Effort: No respiratory distress.  ?   Breath sounds: No wheezing.  ?Abdominal:  ?   General: There is no distension.  ?   Palpations: There is no mass.  ?   Tenderness: There is no abdominal tenderness. There is no rebound.  ?   Hernia: No hernia is present.  ?   Comments: Tenderness to palpation on the RUQ and pelvic area   ?Musculoskeletal:     ?   General: No swelling or signs of injury.  ?   Cervical back:  Normal range of motion and neck supple. No rigidity or tenderness.  ?   Left lower leg: No edema.  ?Skin: ?   General: Skin is warm.  ?Neurological:  ?   Mental Status: He is alert.  ? ? ?Results for orders placed or performed in visit on 03/13/22  ?Urinalysis, Routine w reflex microscopic  ?Result Value Ref Range  ? Specific Gravity, UA <1.005 (L) 1.005 - 1.030  ? pH, UA 6.0 5.0 - 7.5  ? Color, UA Yellow Yellow  ? Appearance Ur Clear Clear  ? Leukocytes,UA Negative Negative  ? Protein,UA Negative Negative/Trace  ? Glucose, UA 3+ (A) Negative  ? Ketones, UA Negative Negative  ? RBC, UA Negative Negative  ? Bilirubin, UA Negative Negative  ? Urobilinogen, Ur 0.2 0.2 - 1.0 mg/dL  ? Nitrite, UA Negative Negative  ? ?   ? ? ?Current Outpatient Medications:  ??  acetaminophen (TYLENOL) 500 MG tablet, Take 1 tablet (500 mg total) by mouth every 6 (six) hours as needed., Disp:  30 tablet, Rfl: 0 ??  albuterol (PROVENTIL) (2.5 MG/3ML) 0.083% nebulizer solution, Take 3 mLs (2.5 mg total) by nebulization every 6 (six) hours as needed for wheezing or shortness of breath., Disp: 360 mL, Rfl: 5 ??  albuterol (VENTOLIN HFA) 108 (90 Base) MCG/ACT inhaler, INHALE 2 PUFFS BY MOUTH EVERY 6 HOURS ASNEEDED WHEEZING/ SHORTNESS OF BREATH, Disp: 8.5 g, Rfl: 1 ??  ALPRAZolam (XANAX) 0.5 MG tablet, Take 0.5 mg by mouth 4 (four) times daily. Patient takes when wakes up(~0600)/ 1000/ 1600/ 2000, Disp: , Rfl:  ??  budesonide-formoterol (SYMBICORT) 80-4.5 MCG/ACT inhaler, Inhale 2 puffs into the lungs in the morning and at bedtime., Disp: 1 each, Rfl: 12 ??  carboxymethylcellulose (REFRESH PLUS) 0.5 % SOLN, Place 2 drops into both eyes daily as needed (Dry eyes)., Disp: , Rfl:  ??  Continuous Blood Gluc Sensor (DEXCOM G6 SENSOR) MISC, 1 Device by Does not apply route See admin instructions. Change every 10 days, Disp: 9 each, Rfl: 3 ??  diclofenac Sodium (VOLTAREN) 1 % GEL, Apply 2 g topically 4 (four) times daily., Disp: 2 g, Rfl: 2 ??  fenofibrate (TRICOR) 145 MG tablet, TAKE 1 TABLET BY MOUTH ONCE DAILY, Disp: 90 tablet, Rfl: 0 ??  glucose blood test strip, Use as instructed, Disp: 200 each, Rfl: 12 ??  ibuprofen (ADVIL) 400 MG tablet, Take 1 tablet (400 mg total) by mouth every 6 (six) hours as needed., Disp: 30 tablet, Rfl: 0 ??  insulin isophane & regular human KwikPen (HUMULIN 70/30 KWIKPEN) (70-30) 100 UNIT/ML KwikPen, Inject 100 Units into the skin daily with breakfast., Disp: 105 mL, Rfl: 3 ??  Insulin Pen Needle (ULTRACARE PEN NEEDLES) 32G X 4 MM MISC, Use to inject insulin 2 times daily, Disp: 90 each, Rfl: 1 ??  JARDIANCE 25 MG TABS tablet, TAKE 1 TABLET BY MOUTH ONCE DAILY BEFOREBREAKFAST, Disp: 30 tablet, Rfl: 2 ??  lipase/protease/amylase (CREON) 36000 UNITS CPEP capsule, Take 2 capsules (72,000 Units total) by mouth 3 (three) times daily with meals. May also take 1 capsule (36,000 Units total)  as needed (with snacks)., Disp: 240 capsule, Rfl: 11 ??  OLANZapine (ZYPREXA) 20 MG tablet, Take 20 mg by mouth at bedtime., Disp: , Rfl:  ??  omeprazole (PRILOSEC) 40 MG capsule, Take 1 capsule (40 mg total) by mouth daily., Disp: 30 capsule, Rfl: 6 ??  ondansetron (ZOFRAN-ODT) 8 MG disintegrating tablet, Take 1 tablet (8 mg total) by mouth every 8 (eight)  hours as needed for nausea or vomiting., Disp: 20 tablet, Rfl: 0 ??  OneTouch Delica Lancets 62B MISC, USE TO CHECK FASTING SUGAR TWICE DAILY, Disp: 100 each, Rfl: 1 ??  sertraline (ZOLOFT) 100 MG tablet, Take 100 mg by mouth daily. , Disp: , Rfl:  ??  simvastatin (ZOCOR) 80 MG tablet, Take 1 tablet (80 mg total) by mouth at bedtime., Disp: 30 tablet, Rfl: 0 ??  Eszopiclone 3 MG TABS, Take 3 mg by mouth at bedtime as needed., Disp: , Rfl:   ? ?The previously noted hypoattenuated rim enhancing collection in ?the left lobe of the liver is not appreciated on today's exam. There ?is mild residual increased attenuation in the area. ?2. Improved main pancreatic ductal dilation and inflammatory changes ?in the pancreas. ?3. The cystic structure associated with the tail of the pancreas, ?thought to represent a pseudocyst, is slightly increased in size ?measuring 3.8 cm in diameter. ?4. Bulbous thickening of the head of the pancreas, etiology ?uncertain, possibly due to chronic pancreatitis. ?5. Chronic mucosal thickening of the gastric wall. ?6. Aortic atherosclerosis. ?  ?Aortic Atherosclerosis (ICD10-I70.0). ?Assessment & Plan:  ?Chronic abdominal pain :not seeing pain management. Cannot find someone who can help per pt.  ?Has been taking imodium. ?Diarrhea better now but had 10-15 loose stools Saturday, Sunday and Monday  ?Will check CBC and CMP sec to recent diarrhea.  ? ?Problem List Items Addressed This Visit   ? ?  ? Other  ? Other chronic pain - Primary  ? Relevant Orders  ? Ambulatory referral to Pain Clinic  ? Right upper quadrant abdominal pain  ? Relevant  Orders  ? CBC With Differential/Platelet  ? Comprehensive metabolic panel  ? Urinalysis, Routine w reflex microscopic (Completed)  ? CBC with Differential/Platelet  ? Pelvic pain  ? Relevant Orders  ? CBC with Differenti

## 2022-03-14 LAB — CBC WITH DIFFERENTIAL/PLATELET
Basophils Absolute: 0 10*3/uL (ref 0.0–0.2)
Basos: 1 %
EOS (ABSOLUTE): 0.1 10*3/uL (ref 0.0–0.4)
Eos: 1 %
Hematocrit: 43 % (ref 37.5–51.0)
Hemoglobin: 14 g/dL (ref 13.0–17.7)
Immature Grans (Abs): 0 10*3/uL (ref 0.0–0.1)
Immature Granulocytes: 0 %
Lymphocytes Absolute: 0.8 10*3/uL (ref 0.7–3.1)
Lymphs: 13 %
MCH: 30.4 pg (ref 26.6–33.0)
MCHC: 32.6 g/dL (ref 31.5–35.7)
MCV: 94 fL (ref 79–97)
Monocytes Absolute: 0.8 10*3/uL (ref 0.1–0.9)
Monocytes: 12 %
Neutrophils Absolute: 4.9 10*3/uL (ref 1.4–7.0)
Neutrophils: 73 %
Platelets: 163 10*3/uL (ref 150–450)
RBC: 4.6 x10E6/uL (ref 4.14–5.80)
RDW: 12.8 % (ref 11.6–15.4)
WBC: 6.7 10*3/uL (ref 3.4–10.8)

## 2022-03-14 LAB — COMPREHENSIVE METABOLIC PANEL
ALT: 13 IU/L (ref 0–44)
AST: 20 IU/L (ref 0–40)
Albumin/Globulin Ratio: 1.8 (ref 1.2–2.2)
Albumin: 4.2 g/dL (ref 3.8–4.9)
Alkaline Phosphatase: 84 IU/L (ref 44–121)
BUN/Creatinine Ratio: 14 (ref 9–20)
BUN: 11 mg/dL (ref 6–24)
Bilirubin Total: 0.3 mg/dL (ref 0.0–1.2)
CO2: 21 mmol/L (ref 20–29)
Calcium: 8.9 mg/dL (ref 8.7–10.2)
Chloride: 98 mmol/L (ref 96–106)
Creatinine, Ser: 0.78 mg/dL (ref 0.76–1.27)
Globulin, Total: 2.4 g/dL (ref 1.5–4.5)
Glucose: 184 mg/dL — ABNORMAL HIGH (ref 70–99)
Potassium: 3.9 mmol/L (ref 3.5–5.2)
Sodium: 135 mmol/L (ref 134–144)
Total Protein: 6.6 g/dL (ref 6.0–8.5)
eGFR: 107 mL/min/{1.73_m2} (ref >59)

## 2022-03-15 ENCOUNTER — Ambulatory Visit: Payer: Medicare Other | Admitting: Endocrinology

## 2022-03-16 ENCOUNTER — Encounter: Payer: Self-pay | Admitting: Internal Medicine

## 2022-03-19 DIAGNOSIS — M9901 Segmental and somatic dysfunction of cervical region: Secondary | ICD-10-CM | POA: Diagnosis not present

## 2022-03-19 DIAGNOSIS — M9902 Segmental and somatic dysfunction of thoracic region: Secondary | ICD-10-CM | POA: Diagnosis not present

## 2022-03-19 DIAGNOSIS — M62830 Muscle spasm of back: Secondary | ICD-10-CM | POA: Diagnosis not present

## 2022-03-19 DIAGNOSIS — M542 Cervicalgia: Secondary | ICD-10-CM | POA: Diagnosis not present

## 2022-03-20 ENCOUNTER — Other Ambulatory Visit: Payer: Self-pay | Admitting: Internal Medicine

## 2022-03-20 ENCOUNTER — Other Ambulatory Visit: Payer: Self-pay

## 2022-03-20 ENCOUNTER — Encounter: Payer: Self-pay | Admitting: Internal Medicine

## 2022-03-20 DIAGNOSIS — E1169 Type 2 diabetes mellitus with other specified complication: Secondary | ICD-10-CM

## 2022-03-20 NOTE — Telephone Encounter (Signed)
Will need to obtain notes from the past and what kind of injuries this pt had. He can take naproxen and will refer to Physical therapy for further eval and tx. Pl let pt know thnx.

## 2022-03-20 NOTE — Telephone Encounter (Signed)
LVM with Dr. Levada Dy recommendations ?

## 2022-03-20 NOTE — Telephone Encounter (Signed)
Can we refer him to ortho please he needs to be evaluated. We will not be able to send this in he can use naproxen as I had stated and its NOT a narcotic. Pl let pt know. Thnx. He needs an antiinflammatory med and this should be great to start until he sees a speicalist who can rx him somethieng else after a proper evaluation

## 2022-03-21 NOTE — Telephone Encounter (Signed)
Requested Prescriptions  ?Pending Prescriptions Disp Refills  ?? simvastatin (ZOCOR) 80 MG tablet [Pharmacy Med Name: SIMVASTATIN 80 MG TAB] 90 tablet   ?  Sig: TAKE 1 TABLET BY MOUTH AT BEDTIME  ?  ? Cardiovascular:  Antilipid - Statins Failed - 03/20/2022 11:16 AM  ?  ?  Failed - Lipid Panel in normal range within the last 12 months  ?  Cholesterol, Total  ?Date Value Ref Range Status  ?10/18/2021 247 (H) 100 - 199 mg/dL Final  ? ?Cholesterol  ?Date Value Ref Range Status  ?08/03/2014 140 0 - 200 mg/dL Final  ? ?Ldl Cholesterol, Calc  ?Date Value Ref Range Status  ?08/03/2014 57 0 - 100 mg/dL Final  ? ?LDL Chol Calc (NIH)  ?Date Value Ref Range Status  ?10/18/2021 125 (H) 0 - 99 mg/dL Final  ? ?HDL Cholesterol  ?Date Value Ref Range Status  ?08/03/2014 35 (L) 40 - 60 mg/dL Final  ? ?HDL  ?Date Value Ref Range Status  ?10/18/2021 41 >39 mg/dL Final  ? ?Triglycerides  ?Date Value Ref Range Status  ?10/18/2021 455 (H) 0 - 149 mg/dL Final  ?08/03/2014 241 (H) 0 - 200 mg/dL Final  ? ?  ?  ?  Passed - Patient is not pregnant  ?  ?  Passed - Valid encounter within last 12 months  ?  Recent Outpatient Visits   ?      ? 1 week ago Other chronic pain  ? [facility] Vigg, Avanti, MD  ? 2 months ago Visit for suture removal  ? [facility] Vigg, Avanti, MD  ? 3 months ago Diabetes mellitus without complication ([place])  ? [facility] Vigg, Avanti, MD  ? 3 months ago Fluid level behind tympanic membrane of both ears  ? [place], NP  ? 4 months ago Alcohol-induced acute pancreatitis, unspecified complication status  ? [facility] Vigg, Avanti, MD  ?  ?  ? ?  ?  ?  ? ?

## 2022-03-22 ENCOUNTER — Other Ambulatory Visit: Payer: Self-pay | Admitting: Gastroenterology

## 2022-03-26 DIAGNOSIS — M9902 Segmental and somatic dysfunction of thoracic region: Secondary | ICD-10-CM | POA: Diagnosis not present

## 2022-03-26 DIAGNOSIS — M542 Cervicalgia: Secondary | ICD-10-CM | POA: Diagnosis not present

## 2022-03-26 DIAGNOSIS — M62830 Muscle spasm of back: Secondary | ICD-10-CM | POA: Diagnosis not present

## 2022-03-26 DIAGNOSIS — M9901 Segmental and somatic dysfunction of cervical region: Secondary | ICD-10-CM | POA: Diagnosis not present

## 2022-04-03 DIAGNOSIS — M9901 Segmental and somatic dysfunction of cervical region: Secondary | ICD-10-CM | POA: Diagnosis not present

## 2022-04-03 DIAGNOSIS — M9902 Segmental and somatic dysfunction of thoracic region: Secondary | ICD-10-CM | POA: Diagnosis not present

## 2022-04-03 DIAGNOSIS — M62830 Muscle spasm of back: Secondary | ICD-10-CM | POA: Diagnosis not present

## 2022-04-03 DIAGNOSIS — M542 Cervicalgia: Secondary | ICD-10-CM | POA: Diagnosis not present

## 2022-04-04 DIAGNOSIS — M9902 Segmental and somatic dysfunction of thoracic region: Secondary | ICD-10-CM | POA: Diagnosis not present

## 2022-04-04 DIAGNOSIS — M9901 Segmental and somatic dysfunction of cervical region: Secondary | ICD-10-CM | POA: Diagnosis not present

## 2022-04-04 DIAGNOSIS — M542 Cervicalgia: Secondary | ICD-10-CM | POA: Diagnosis not present

## 2022-04-04 DIAGNOSIS — M62830 Muscle spasm of back: Secondary | ICD-10-CM | POA: Diagnosis not present

## 2022-04-15 ENCOUNTER — Telehealth: Payer: Self-pay | Admitting: Internal Medicine

## 2022-04-15 NOTE — Telephone Encounter (Signed)
Received a call from the pt through after hours team with c/o hyperglycemia on Saturday 6/10th at 4 am   BG was 385 mg/dL , he had taken 75 units of NPH half an hour prior    He was advised to change NPH to 40 units BID and was also advised to use regular insulin per correction scale ( BG-130/30) before each meal   He was advised to give insulin 3-4 hours prior to rechecking glucose due to long half life   Pt called again on Sunday 6/11th at 7 AM with hyperglycemia again BG 399 . He again had just taken 7 units of regular insulin half an hour prior and had a cup of coffee   The team was advised to covey to the pt that to increase NPH 45 units BID   Continue to use regular insulin per correction factor (BG-130/30) and to wait 3-4 hours prior to rechecking glucose due to long half life     [place], MD  [facility]  [facility] [address] [place], [place] [address] Phone: [phone] FAX: [phone]

## 2022-04-16 ENCOUNTER — Encounter: Payer: Self-pay | Admitting: Internal Medicine

## 2022-04-16 ENCOUNTER — Telehealth: Payer: Self-pay

## 2022-04-16 NOTE — Telephone Encounter (Signed)
Disregard mychart message already forwarded to you.

## 2022-04-16 NOTE — Telephone Encounter (Signed)
Patient called in states that Dr Kelton Pillar added Humulin R to his insulin routine. Patient fasting blood sugar was 160 he took Humulin NPH. Drunk a few cups of coffee and spiked to 264 took 3 units of Humulin R and had chest palpitations and doesn't feel comfortable taking the Humulin R.

## 2022-04-17 DIAGNOSIS — M542 Cervicalgia: Secondary | ICD-10-CM | POA: Diagnosis not present

## 2022-04-17 DIAGNOSIS — M9901 Segmental and somatic dysfunction of cervical region: Secondary | ICD-10-CM | POA: Diagnosis not present

## 2022-04-17 DIAGNOSIS — M9902 Segmental and somatic dysfunction of thoracic region: Secondary | ICD-10-CM | POA: Diagnosis not present

## 2022-04-17 DIAGNOSIS — M62830 Muscle spasm of back: Secondary | ICD-10-CM | POA: Diagnosis not present

## 2022-04-18 DIAGNOSIS — M62830 Muscle spasm of back: Secondary | ICD-10-CM | POA: Diagnosis not present

## 2022-04-18 DIAGNOSIS — M542 Cervicalgia: Secondary | ICD-10-CM | POA: Diagnosis not present

## 2022-04-18 DIAGNOSIS — M9902 Segmental and somatic dysfunction of thoracic region: Secondary | ICD-10-CM | POA: Diagnosis not present

## 2022-04-18 DIAGNOSIS — M9901 Segmental and somatic dysfunction of cervical region: Secondary | ICD-10-CM | POA: Diagnosis not present

## 2022-04-23 DIAGNOSIS — E119 Type 2 diabetes mellitus without complications: Secondary | ICD-10-CM | POA: Diagnosis not present

## 2022-04-23 DIAGNOSIS — T85590A Other mechanical complication of bile duct prosthesis, initial encounter: Secondary | ICD-10-CM | POA: Diagnosis not present

## 2022-04-23 DIAGNOSIS — K861 Other chronic pancreatitis: Secondary | ICD-10-CM | POA: Diagnosis not present

## 2022-04-23 DIAGNOSIS — K8689 Other specified diseases of pancreas: Secondary | ICD-10-CM | POA: Diagnosis not present

## 2022-04-23 DIAGNOSIS — Z4659 Encounter for fitting and adjustment of other gastrointestinal appliance and device: Secondary | ICD-10-CM | POA: Diagnosis not present

## 2022-05-03 ENCOUNTER — Other Ambulatory Visit: Payer: Self-pay | Admitting: Internal Medicine

## 2022-05-05 LAB — HM DIABETES EYE EXAM

## 2022-05-07 ENCOUNTER — Other Ambulatory Visit: Payer: Self-pay | Admitting: Internal Medicine

## 2022-05-09 ENCOUNTER — Telehealth: Payer: Self-pay | Admitting: Internal Medicine

## 2022-05-09 ENCOUNTER — Encounter: Payer: Self-pay | Admitting: Nurse Practitioner

## 2022-05-09 ENCOUNTER — Telehealth (INDEPENDENT_AMBULATORY_CARE_PROVIDER_SITE_OTHER): Payer: Medicare Other | Admitting: Nurse Practitioner

## 2022-05-09 DIAGNOSIS — R051 Acute cough: Secondary | ICD-10-CM | POA: Diagnosis not present

## 2022-05-09 MED ORDER — AMOXICILLIN-POT CLAVULANATE 875-125 MG PO TABS: 1.0000 | ORAL_TABLET | Freq: Two times a day (BID) | ORAL | 0 refills | Status: AC

## 2022-05-09 MED ORDER — BENZONATATE 100 MG PO CAPS: 100.0000 mg | ORAL_CAPSULE | Freq: Three times a day (TID) | ORAL | 0 refills | Status: DC | PRN

## 2022-05-09 NOTE — Assessment & Plan Note (Signed)
Acute, with history of sinusitis and multiple co morbidities on chart.  At this time recommend he Covid test at home and alert provider to results, as may need to treat for this if present.  Will send in Augmentin due to his multiple co morbidities and current illness, concern for worsening.  Tessalon sent for cough.  Recommend: - Increased rest - Increasing Fluids - Acetaminophen as needed for fever/pain.  - Mucinex.  - Saline sinus flushes or a neti pot.  - Humidifying the air Return to office for worsening or ongoing.

## 2022-05-09 NOTE — Telephone Encounter (Signed)
FYI to provider

## 2022-05-09 NOTE — Telephone Encounter (Signed)
Pt stated was told to call back to let Jolene know if the COVID test was positive or Negative.  Pt stated it was negative.    Please advise.

## 2022-05-09 NOTE — Telephone Encounter (Signed)
Patient notified of Shaun Odonnell's message.   °

## 2022-05-09 NOTE — Progress Notes (Signed)
There were no vitals taken for this visit.   Subjective:    Patient ID: Shaun Odonnell, male    DOB: 02-Nov-1966, 56 y.o.   MRN: [id number]  HPI: Shaun Odonnell is a 56 y.o. male  Chief Complaint  Patient presents with   Chills   Cough   Generalized Body Aches   Nasal Congestion    Patient says "he is pretty sure he does not have Covid." Patient says his dad was recently placed in a nursing skilled facility and he has been there with him and pretty sure he may caught a "bug" there. Patient says he has not tried any medication over the counter. Patient is requesting a antibiotic.    Emesis   This visit was completed via video visit through MyChart due to the restrictions of the COVID-19 pandemic. All issues as above were discussed and addressed. Physical exam was done as above through visual confirmation on video through MyChart. If it was felt that the patient should be evaluated in the office, they were directed there. The patient verbally consented to this visit. Location of the patient: home Location of the provider: work Those involved with this call:  Provider: Marnee Guarneri, DNP CMA: Irena Reichmann, [place] Desk/Registration: [employer]  Time spent on call:  20 minutes with patient face to face via video conference. More than 50% of this time was spent in counseling and coordination of care. 15 minutes total spent in review of patient's record and preparation of their chart.  I verified patient identity using two factors (patient name and date of birth). Patient consents verbally to being seen via telemedicine visit today.    UPPER RESPIRATORY TRACT INFECTION Woke-up this morning with emesis and fever + myalgias.  Has not performed Covid testing at home. Has had both shots and booster.  His father is in skilled nursing facility and he has been present there.  Significant history of sinus infections and overall multiple co morbidities. Worst symptom: body aches Fever:  yes -- myalgias Cough: yes Shortness of breath: no Wheezing: no Chest pain: no Chest tightness: no Chest congestion: no Nasal congestion: yes Runny nose: yes Post nasal drip: yes Sneezing: no Sore throat: no Swollen glands: no Sinus pressure: yes Headache: no Face pain: no Toothache: no Ear pain: none Ear pressure: none Eyes red/itching:no Eye drainage/crusting: no  Vomiting: yes Rash: no Fatigue: yes Sick contacts:  as above Strep contacts: no  Context: stable Recurrent sinusitis: no Relief with OTC cold/cough medications: no  Treatments attempted: none tried    Relevant past medical, surgical, family and social history reviewed and updated as indicated. Interim medical history since our last visit reviewed. Allergies and medications reviewed and updated.  Review of Systems  Constitutional:  Positive for appetite change, chills and fatigue. Negative for activity change, diaphoresis and fever.  HENT:  Positive for congestion, postnasal drip, rhinorrhea and sinus pressure. Negative for ear discharge, ear pain, sinus pain, sneezing, sore throat and voice change.   Respiratory:  Positive for cough. Negative for chest tightness, shortness of breath and wheezing.   Cardiovascular:  Negative for chest pain, palpitations and leg swelling.  Gastrointestinal:  Positive for nausea and vomiting. Negative for abdominal distention, abdominal pain, constipation and diarrhea.  Skin: Negative.   Neurological: Negative.   Psychiatric/Behavioral: Negative.      Per HPI unless specifically indicated above     Objective:    There were no vitals taken for this visit.  Wt  Readings from Last 3 Encounters:  03/13/22 212 lb 6.4 oz (96.3 kg)  01/10/22 186 lb (84.4 kg)  01/05/22 185 lb 9.6 oz (84.2 kg)    Physical Exam Vitals and nursing note reviewed.  Constitutional:      General: He is awake. He is not in acute distress.    Appearance: He is well-developed. He is ill-appearing. He  is not toxic-appearing.  HENT:     Head: Normocephalic.     Right Ear: Hearing normal. No drainage.     Left Ear: Hearing normal. No drainage.  Eyes:     General: Lids are normal.        Right eye: No discharge.        Left eye: No discharge.     Conjunctiva/sclera: Conjunctivae normal.  Pulmonary:     Effort: Pulmonary effort is normal. No accessory muscle usage or respiratory distress.  Musculoskeletal:     Cervical back: Normal range of motion.  Neurological:     Mental Status: He is alert and oriented to person, place, and time.  Psychiatric:        Mood and Affect: Mood normal.        Behavior: Behavior normal. Behavior is cooperative.        Thought Content: Thought content normal.        Judgment: Judgment normal.    Results for orders placed or performed in visit on 03/13/22  Comprehensive metabolic panel  Result Value Ref Range   Glucose 184 (H) 70 - 99 mg/dL   BUN 11 6 - 24 mg/dL   Creatinine, Ser 0.78 0.76 - 1.27 mg/dL   eGFR 107 >59 mL/min/1.73   BUN/Creatinine Ratio 14 9 - 20   Sodium 135 134 - 144 mmol/L   Potassium 3.9 3.5 - 5.2 mmol/L   Chloride 98 96 - 106 mmol/L   CO2 21 20 - 29 mmol/L   Calcium 8.9 8.7 - 10.2 mg/dL   Total Protein 6.6 6.0 - 8.5 g/dL   Albumin 4.2 3.8 - 4.9 g/dL   Globulin, Total 2.4 1.5 - 4.5 g/dL   Albumin/Globulin Ratio 1.8 1.2 - 2.2   Bilirubin Total 0.3 0.0 - 1.2 mg/dL   Alkaline Phosphatase 84 44 - 121 IU/L   AST 20 0 - 40 IU/L   ALT 13 0 - 44 IU/L  Urinalysis, Routine w reflex microscopic  Result Value Ref Range   Specific Gravity, UA <1.005 (L) 1.005 - 1.030   pH, UA 6.0 5.0 - 7.5   Color, UA Yellow Yellow   Appearance Ur Clear Clear   Leukocytes,UA Negative Negative   Protein,UA Negative Negative/Trace   Glucose, UA 3+ (A) Negative   Ketones, UA Negative Negative   RBC, UA Negative Negative   Bilirubin, UA Negative Negative   Urobilinogen, Ur 0.2 0.2 - 1.0 mg/dL   Nitrite, UA Negative Negative  CBC with  Differential/Platelet  Result Value Ref Range   WBC 6.7 3.4 - 10.8 x10E3/uL   RBC 4.60 4.14 - 5.80 x10E6/uL   Hemoglobin 14.0 13.0 - 17.7 g/dL   Hematocrit 43.0 37.5 - 51.0 %   MCV 94 79 - 97 fL   MCH 30.4 26.6 - 33.0 pg   MCHC 32.6 31.5 - 35.7 g/dL   RDW 12.8 11.6 - 15.4 %   Platelets 163 150 - 450 x10E3/uL   Neutrophils 73 Not Estab. %   Lymphs 13 Not Estab. %   Monocytes 12 Not Estab. %   Eos  1 Not Estab. %   Basos 1 Not Estab. %   Neutrophils Absolute 4.9 1.4 - 7.0 x10E3/uL   Lymphocytes Absolute 0.8 0.7 - 3.1 x10E3/uL   Monocytes Absolute 0.8 0.1 - 0.9 x10E3/uL   EOS (ABSOLUTE) 0.1 0.0 - 0.4 x10E3/uL   Basophils Absolute 0.0 0.0 - 0.2 x10E3/uL   Immature Granulocytes 0 Not Estab. %   Immature Grans (Abs) 0.0 0.0 - 0.1 x10E3/uL      Assessment & Plan:   Problem List Items Addressed This Visit       Other   Acute cough - Primary    Acute, with history of sinusitis and multiple co morbidities on chart.  At this time recommend he Covid test at home and alert provider to results, as may need to treat for this if present.  Will send in Augmentin due to his multiple co morbidities and current illness, concern for worsening.  Tessalon sent for cough.  Recommend: - Increased rest - Increasing Fluids - Acetaminophen as needed for fever/pain.  - Mucinex.  - Saline sinus flushes or a neti pot.  - Humidifying the air Return to office for worsening or ongoing.       I discussed the assessment and treatment plan with the patient. The patient was provided an opportunity to ask questions and all were answered. The patient agreed with the plan and demonstrated an understanding of the instructions.   The patient was advised to call back or seek an in-person evaluation if the symptoms worsen or if the condition fails to improve as anticipated.   I provided 21+ minutes of time during this encounter.   Follow up plan: Return if symptoms worsen or fail to improve.

## 2022-05-09 NOTE — Patient Instructions (Signed)

## 2022-05-10 ENCOUNTER — Telehealth: Payer: Self-pay

## 2022-05-10 NOTE — Telephone Encounter (Signed)
Prior authorization was initiated via CoverMyMeds for prescription of Benzonatate 100 MG capsule.   KEY: [id number]  Patient was denied. Please advise?

## 2022-05-11 ENCOUNTER — Other Ambulatory Visit: Payer: Self-pay

## 2022-05-11 DIAGNOSIS — M1712 Unilateral primary osteoarthritis, left knee: Secondary | ICD-10-CM | POA: Diagnosis not present

## 2022-05-11 DIAGNOSIS — S8002XA Contusion of left knee, initial encounter: Secondary | ICD-10-CM | POA: Diagnosis not present

## 2022-05-11 MED ORDER — FENOFIBRATE 145 MG PO TABS: 145.0000 mg | ORAL_TABLET | Freq: Every day | ORAL | 0 refills | Status: DC

## 2022-05-11 NOTE — Telephone Encounter (Signed)
Noted  

## 2022-05-11 NOTE — Telephone Encounter (Signed)
Left message for patient to give our office a call back to discuss Jolene's recommendations. Advised patient to give our office a call back to discuss.   OK to [facility] to give note if patient calls back.

## 2022-05-11 NOTE — Telephone Encounter (Signed)
Patient states his cough is better- will use the OTC if needed. Patient still has runny nose- advised continue OTC treatment, netipot, saline rinse.Patient reports negative COVID test. Patient advised to call back if symptoms get worse.  FYI: Patient does report he was seen at Ortho- diagnosed with arthritis in both knees- they wrapped injured knee and prescribed Meloxicam for inflammation.

## 2022-05-14 ENCOUNTER — Encounter: Payer: Self-pay | Admitting: Internal Medicine

## 2022-05-22 ENCOUNTER — Telehealth: Payer: Self-pay

## 2022-05-22 ENCOUNTER — Emergency Department: Payer: Medicare Other

## 2022-05-22 ENCOUNTER — Ambulatory Visit: Payer: Self-pay

## 2022-05-22 ENCOUNTER — Emergency Department
Admission: EM | Admit: 2022-05-22 | Discharge: 2022-05-22 | Disposition: A | Discharge status: Home / Self Care | Payer: Medicare Other | Attending: Emergency Medicine | Admitting: Emergency Medicine

## 2022-05-22 ENCOUNTER — Other Ambulatory Visit: Payer: Self-pay | Admitting: Internal Medicine

## 2022-05-22 ENCOUNTER — Other Ambulatory Visit: Payer: Self-pay

## 2022-05-22 DIAGNOSIS — R112 Nausea with vomiting, unspecified: Secondary | ICD-10-CM | POA: Diagnosis not present

## 2022-05-22 DIAGNOSIS — R1013 Epigastric pain: Secondary | ICD-10-CM | POA: Diagnosis present

## 2022-05-22 DIAGNOSIS — R1084 Generalized abdominal pain: Secondary | ICD-10-CM | POA: Insufficient documentation

## 2022-05-22 DIAGNOSIS — J449 Chronic obstructive pulmonary disease, unspecified: Secondary | ICD-10-CM | POA: Insufficient documentation

## 2022-05-22 DIAGNOSIS — J45909 Unspecified asthma, uncomplicated: Secondary | ICD-10-CM | POA: Insufficient documentation

## 2022-05-22 DIAGNOSIS — K8689 Other specified diseases of pancreas: Secondary | ICD-10-CM | POA: Diagnosis not present

## 2022-05-22 DIAGNOSIS — I129 Hypertensive chronic kidney disease with stage 1 through stage 4 chronic kidney disease, or unspecified chronic kidney disease: Secondary | ICD-10-CM | POA: Diagnosis not present

## 2022-05-22 DIAGNOSIS — I10 Essential (primary) hypertension: Secondary | ICD-10-CM | POA: Diagnosis not present

## 2022-05-22 DIAGNOSIS — R079 Chest pain, unspecified: Secondary | ICD-10-CM | POA: Diagnosis not present

## 2022-05-22 DIAGNOSIS — E1122 Type 2 diabetes mellitus with diabetic chronic kidney disease: Secondary | ICD-10-CM | POA: Insufficient documentation

## 2022-05-22 DIAGNOSIS — N189 Chronic kidney disease, unspecified: Secondary | ICD-10-CM | POA: Diagnosis not present

## 2022-05-22 DIAGNOSIS — K2289 Other specified disease of esophagus: Secondary | ICD-10-CM | POA: Diagnosis not present

## 2022-05-22 LAB — BASIC METABOLIC PANEL
Anion gap: 8 (ref 5–15)
BUN: 13 mg/dL (ref 6–20)
CO2: 26 mmol/L (ref 22–32)
Calcium: 9.5 mg/dL (ref 8.9–10.3)
Chloride: 101 mmol/L (ref 98–111)
Creatinine, Ser: 0.85 mg/dL (ref 0.61–1.24)
GFR, Estimated: >60 mL/min (ref >60)
Glucose, Bld: 125 mg/dL — ABNORMAL HIGH (ref 70–99)
Potassium: 4.2 mmol/L (ref 3.5–5.1)
Sodium: 135 mmol/L (ref 135–145)

## 2022-05-22 LAB — URINALYSIS, ROUTINE W REFLEX MICROSCOPIC
Bilirubin Urine: NEGATIVE
Glucose, UA: >=500 mg/dL — AB
Hgb urine dipstick: NEGATIVE
Ketones, ur: NEGATIVE mg/dL
Leukocytes,Ua: NEGATIVE
Nitrite: NEGATIVE
Protein, ur: NEGATIVE mg/dL
Specific Gravity, Urine: 1.023 (ref 1.005–1.030)
pH: 6 (ref 5.0–8.0)

## 2022-05-22 LAB — CBC
HCT: 46.6 % (ref 39.0–52.0)
Hemoglobin: 15.5 g/dL (ref 13.0–17.0)
MCH: 30.2 pg (ref 26.0–34.0)
MCHC: 33.3 g/dL (ref 30.0–36.0)
MCV: 90.8 fL (ref 80.0–100.0)
Platelets: 218 10*3/uL (ref 150–400)
RBC: 5.13 MIL/uL (ref 4.22–5.81)
RDW: 12.6 % (ref 11.5–15.5)
WBC: 7.1 10*3/uL (ref 4.0–10.5)
nRBC: 0.3 % — ABNORMAL HIGH (ref 0.0–0.2)

## 2022-05-22 LAB — LIPASE, BLOOD: Lipase: 60 U/L — ABNORMAL HIGH (ref 11–51)

## 2022-05-22 LAB — TROPONIN I (HIGH SENSITIVITY)
Troponin I (High Sensitivity): 4 ng/L (ref <18)
Troponin I (High Sensitivity): 4 ng/L (ref <18)

## 2022-05-22 LAB — HEPATIC FUNCTION PANEL
ALT: 16 U/L (ref 0–44)
AST: 19 U/L (ref 15–41)
Albumin: 4.2 g/dL (ref 3.5–5.0)
Alkaline Phosphatase: 90 U/L (ref 38–126)
Bilirubin, Direct: 0.1 mg/dL (ref 0.0–0.2)
Indirect Bilirubin: 0.4 mg/dL (ref 0.3–0.9)
Total Bilirubin: 0.5 mg/dL (ref 0.3–1.2)
Total Protein: 8.2 g/dL — ABNORMAL HIGH (ref 6.5–8.1)

## 2022-05-22 LAB — CBG MONITORING, ED: Glucose-Capillary: 78 mg/dL (ref 70–99)

## 2022-05-22 MED ORDER — HYDROMORPHONE HCL 1 MG/ML IJ SOLN
1.0000 mg | Freq: Once | INTRAMUSCULAR | Status: AC
  Administered 2022-05-22: 1 mg via INTRAVENOUS
  Filled 2022-05-22: qty 1

## 2022-05-22 MED ORDER — IOHEXOL 300 MG/ML  SOLN
100.0000 mL | Freq: Once | INTRAMUSCULAR | Status: AC | PRN
  Administered 2022-05-22: 100 mL via INTRAVENOUS

## 2022-05-22 MED ORDER — KETONE TEST VI STRP: 1.0000 | ORAL_STRIP | Freq: Every day | 1 refills | Status: DC

## 2022-05-22 MED ORDER — HYDROMORPHONE HCL 1 MG/ML IJ SOLN
1.0000 mg | Freq: Once | INTRAMUSCULAR | Status: DC
  Filled 2022-05-22 (×2): qty 1

## 2022-05-22 MED ORDER — SODIUM CHLORIDE 0.9 % IV BOLUS
1000.0000 mL | Freq: Once | INTRAVENOUS | Status: AC
  Administered 2022-05-22: 1000 mL via INTRAVENOUS

## 2022-05-22 MED ORDER — OXYCODONE HCL 5 MG PO TABS: 5.0000 mg | ORAL_TABLET | Freq: Three times a day (TID) | ORAL | 0 refills | Status: AC | PRN

## 2022-05-22 NOTE — Progress Notes (Unsigned)
Ketone strips

## 2022-05-22 NOTE — Telephone Encounter (Signed)
Patient currently at [facility].

## 2022-05-22 NOTE — ED Provider Notes (Signed)
[facility] Provider Note    Event Date/Time   First MD Initiated Contact with Patient 05/22/22 1533     (approximate)   History   Chest Pain   HPI  Shaun Odonnell is a 56 y.o. male past medical history of alcohol use disorder, chronic pancreatitis s/p pancreatic duct stenting diabetes, COPD, CKD presents with abdominal and chest pain.  Symptoms started about 4 days ago.  He endorses pain in the epigastric region rating to the bilateral flanks and bilateral lower abdominal region also radiates into the upper chest.  Pain is rather constant associated with nausea and vomiting.  Over the last 4 days had been minimally able to tolerate p.o. but today is no longer vomiting has been able to drink water.  Last night he did drink a beer which she thinks helped his pain.  Denies diarrhea or blood in his stool no hematemesis no fevers chills or urinary symptoms.  Pain feels similar to prior episodes of pancreatitis.     Past Medical History:  Diagnosis Date   Alcohol abuse 04/29/2019   Allergy    Anxiety    ARDS (adult respiratory distress syndrome) (HCC)    Asthma    Bipolar disorder ([place])    Cataract    Chronic kidney disease    per pt - no current problems   COPD (chronic obstructive pulmonary disease) (HCC)    chronic cough and wheezing   Depression    Diabetes mellitus without complication ([place])    type 2   Dizziness    medication related   Dyspnea    with exertion   Elevated lipids    Fatty liver    Hypercholesterolemia    Hypertension    per pt, no current prob-no meds   Pancreatitis    Pneumonia    in past   Pulmonary embolism ([place])    Schizoaffective disorder ([place])     Patient Active Problem List   Diagnosis Date Noted   Acute cough 05/09/2022   History of colonic polyps    Polyp of ascending colon    Other chronic pain 11/25/2021   Gastroesophageal reflux disease with esophagitis without hemorrhage 11/25/2021   Alcohol use  disorder, moderate, dependence ([place]) 07/06/2021   Nicotine dependence, cigarettes, w unsp disorders 07/06/2021   Abnormal MRI of pancreas 07/06/2021   Recurrent acute pancreatitis 07/06/2021   Dilation of pancreatic duct 07/06/2021   Pancreatic cyst 07/06/2021   Abnormal CT of the abdomen 07/06/2021   Paroxysmal SVT (supraventricular tachycardia) ([place]) 06/30/2021   Pancreatic stones 06/28/2021   Diabetes mellitus without complication ([place]) 63/89/3734   Hypertriglyceridemia 06/28/2021   Schizoaffective disorder ([place])    Palpitations 06/05/2021   Hyperlipidemia associated with type 2 diabetes mellitus ([place]) 09/17/2019   Xyphoidalgia    Type 2 diabetes mellitus with hyperglycemia, without long-term current use of insulin ([place]) 04/29/2019   Chronic obstructive pulmonary disease ([place]) 04/29/2019   Bipolar depression ([place]) 28/76/8115   Umbilical hernia without obstruction and without gangrene 04/23/2019   Drug abuse, opioid type ([place]) 08/14/2014   Fatty liver, alcoholic 72/62/0355   Alcohol-induced chronic pancreatitis ([place]) 06/17/2014   Hypertension associated with diabetes ([place]) 06/17/2014     Physical Exam  Triage Vital Signs: ED Triage Vitals  Enc Vitals Group     BP 05/22/22 1337 122/79     Pulse Rate 05/22/22 1337 89     Resp 05/22/22 1337 16     Temp 05/22/22 1337 97.6 F (36.4  C)     Temp Source 05/22/22 1337 Oral     SpO2 05/22/22 1337 96 %     Weight 05/22/22 1338 212 lb 4.9 oz (96.3 kg)     Height 05/22/22 1338 '5\' 7"'$  (1.702 m)     Head Circumference --      Peak Flow --      Pain Score 05/22/22 1337 3     Pain Loc --      Pain Edu? --      Excl. in [place]? --     Most recent vital signs: Vitals:   05/22/22 1737 05/22/22 1745  BP:    Pulse:    Resp:    Temp: 98.5 F (36.9 C)   SpO2:  93%     General: Awake, no distress.  CV:  Good peripheral perfusion.  Resp:  Normal effort.  Abd:  No distention.  Mild tenderness to palpation in the epigastric and  bilateral upper quadrants no guarding abdomen soft Neuro:             Awake, Alert, Oriented x 3  Other:     ED Results / Procedures / Treatments  Labs (all labs ordered are listed, but only abnormal results are displayed) Labs Reviewed  BASIC METABOLIC PANEL - Abnormal; Notable for the following components:      Result Value   Glucose, Bld 125 (*)    All other components within normal limits  CBC - Abnormal; Notable for the following components:   nRBC 0.3 (*)    All other components within normal limits  HEPATIC FUNCTION PANEL - Abnormal; Notable for the following components:   Total Protein 8.2 (*)    All other components within normal limits  LIPASE, BLOOD - Abnormal; Notable for the following components:   Lipase 60 (*)    All other components within normal limits  URINALYSIS, ROUTINE W REFLEX MICROSCOPIC  CBG MONITORING, ED  TROPONIN I (HIGH SENSITIVITY)  TROPONIN I (HIGH SENSITIVITY)     EKG  EKG reviewed and interpreted by myself shows normal sinus rhythm normal axis normal intervals T wave inversions in the inferior leads and lateral precordial leads, appears similar to prior   RADIOLOGY CT abdomen pelvis reviewed and interpreted by myself shows pancreatic ductal dilatation no acute process   PROCEDURES:  Critical Care performed: No  Procedures  The patient is on the cardiac monitor to evaluate for evidence of arrhythmia and/or significant heart rate changes.   MEDICATIONS ORDERED IN ED: Medications  HYDROmorphone (DILAUDID) injection 1 mg (1 mg Intravenous Not Given 05/22/22 1606)  sodium chloride 0.9 % bolus 1,000 mL (1,000 mLs Intravenous New Bag/Given 05/22/22 1606)  iohexol (OMNIPAQUE) 300 MG/ML solution 100 mL (100 mLs Intravenous Contrast Given 05/22/22 1612)  HYDROmorphone (DILAUDID) injection 1 mg (1 mg Intravenous Given 05/22/22 1721)     IMPRESSION / MDM / ASSESSMENT AND PLAN / ED COURSE  I reviewed the triage vital signs and the nursing  notes.                              Patient's presentation is most consistent with acute presentation with potential threat to life or bodily function.  Differential diagnosis includes, but is not limited to, chronic pancreatitis flare, pancreatic obstruction, gastritis, hepatitis, biliary colic   The patient is a 56 year old male with history of chronic pancreatitis status post stenting which has now been removed who presents with upper  abdominal and chest pain.  Symptoms going on for the past 4 days feel typical of his prior flares of chronic pancreatitis.  Has had nausea and vomiting but today is now tolerating p.o.  Has continued to drink beer but infrequently.  Says that beer actually helps his pancreatitis pain.  He is mildly tachypneic but vitals otherwise within normal limits he looks well and comfortable on exam abdomen is mildly tender in the midepigastric region overall is benign.  His EKG does have some T wave inversions in the inferior leads, biphasic T waves in the lateral precordial leads which appear similar to prior.  I suspect that patient's pain is related to his chronic pancreatitis.  Given his complicated history we will obtain a CT abdomen pelvis with contrast check LFTs and lipase.  His BMP CBC and troponin are all reassuring.  We will treat with IV Dilaudid and fluid bolus.    Lipase is normal, as are LFTs.  Patient's CT scan essentially has no acute process.  The pancreatic ducts are still dilated but this is stable from prior.  There is an abrupt cut off which is also stable from prior.  Discussed this findings with the patient he is currently following with Dr. Verl Blalock with GI I recommend he follow-up with him.  There is some concern for potential esophagitis on CT I recommend that he stop taking the ibuprofen continue the omeprazole.  Patient has had difficulty getting into see pain management which I think is part of the issue.  His primary doctor has not yet referred him.  I  will write him a short prescription for oxycodone just to bridge him during his acute painful episode but I did discuss with him the dangers of chronic opiate use.  He will follow-up with his primary doctor tomorrow.  FINAL CLINICAL IMPRESSION(S) / ED DIAGNOSES   Final diagnoses:  Generalized abdominal pain     Rx / DC Orders   ED Discharge Orders          Ordered    oxyCODONE (ROXICODONE) 5 MG immediate release tablet  Every 8 hours PRN        05/22/22 1832             Note:  This document was prepared using Dragon voice recognition software and may include unintentional dictation errors.   Rada Hay, MD 05/22/22 [id number]

## 2022-05-22 NOTE — ED Triage Notes (Addendum)
Pt here with upper GI/lower CP that started a few days ago. Pt states the pain extended to his bilateral flank sides. Pt endorses nausea as well. Pt had pancreatic stents placed in March then removed last month.

## 2022-05-22 NOTE — Discharge Instructions (Signed)
Your CAT scan showed dilation of your pancreatic duct similar to prior.  Your blood work was all reassuring your urine sample did not show any evidence of infection.  I have written you a 5-day prescription for oxycodone.  Please follow-up with your primary care doctor to be referred to pain management.  Please also follow-up with Dr. Verl Blalock with GI.

## 2022-05-22 NOTE — Telephone Encounter (Signed)
Patient states that he is having pain in his kidneys and pancreas and would like to have UA strips and supplies sent into pharmacy so he can check his ketones.

## 2022-05-22 NOTE — Telephone Encounter (Signed)
Patient advised and verbalized understanding 

## 2022-05-22 NOTE — Telephone Encounter (Signed)
  Chief Complaint: upper mid abdomen with radiation to right rear back Symptoms: vomiting, severe pain, gas Frequency: 3 days Pertinent Negatives: Patient denies diarrhea,fever, urination pain Disposition: '[x]'$ ED /'[]'$ Urgent Care (no appt availability in office) / '[]'$ Appointment(In office/virtual)/ '[]'$  [place] [facility]/ '[]'$ Home Care/ '[]'$ Refused Recommended Disposition /'[]'$  Mobile Bus/ '[]'$  Follow-up with PCP Additional Notes: advised to go to ED and to call back after to make hospital f/u call- needs to establish with new PCP (former Dr Neomia Dear pt)      Reason for Disposition  [1] SEVERE pain (e.g., excruciating) AND [2] present > 1 hour  Answer Assessment - Initial Assessment Questions 1. LOCATION: "Where does it hurt?"      Upper abdomen 2. RADIATION: "Does the pain shoot anywhere else?" (e.g., chest, back)    Right rear back 3. ONSET: "When did the pain begin?" (Minutes, hours or days ago)      3 days ago 4. SUDDEN: "Gradual or sudden onset?"     gradually 5. PATTERN "Does the pain come and go, or is it constant?"    - If it comes and goes: "How long does it last?" "Do you have pain now?"     (Note: Comes and goes means the pain is intermittent. It goes away completely between bouts.)    - If constant: "Is it getting better, staying the same, or getting worse?"      (Note: Constant means the pain never goes away completely; most serious pain is constant and gets worse.)      constant 6. SEVERITY: "How bad is the pain?"  (e.g., Scale 1-10; mild, moderate, or severe)    - MILD (1-3): Doesn't interfere with normal activities, abdomen soft and not tender to touch.     - MODERATE (4-7): Interferes with normal activities or awakens from sleep, abdomen tender to touch.     - SEVERE (8-10): Excruciating pain, doubled over, unable to do any normal activities.       Severe 7.RECURRENT SYMPTOM: "Have you ever had this type of stomach pain before?" If Yes, ask: "When was the last  time?" and "What happened that time?"      Pancreatic duct pain 8. CAUSE: "What do you think is causing the stomach pain?"     Duct pain 9. RELIEVING/AGGRAVATING FACTORS: "What makes it better or worse?" (e.g., antacids, bending or twisting motion, bowel movement)     Tylenol /ibuprofen alternates 10. OTHER SYMPTOMS: "Do you have any other symptoms?" (e.g., back pain, diarrhea, fever, urination pain, vomiting)       Back pain, gas, vomiting  Protocols used: Abdominal Pain - Male-A-AH

## 2022-05-24 ENCOUNTER — Telehealth: Payer: Self-pay | Admitting: Emergency Medicine

## 2022-05-24 MED ORDER — OXYCODONE HCL 5 MG PO TABS: 5.0000 mg | ORAL_TABLET | Freq: Three times a day (TID) | ORAL | 0 refills | Status: AC | PRN

## 2022-05-24 NOTE — Telephone Encounter (Signed)
Had sent 5 days of opioid script to pts pharmacy as a bridge to when he can see his pcp/pain management, but only 3 days worth of pills were sent. Pt calling to have an additional 2 days and since we had agreed on 5 days I have sent an additional 2 days to his pharmacy.

## 2022-05-28 ENCOUNTER — Telehealth: Payer: Self-pay | Admitting: *Deleted

## 2022-05-28 NOTE — Telephone Encounter (Signed)
Transition Care Management Unsuccessful Follow-up Telephone Call  Date of discharge and from where:  [facility] 05-22-2022  Attempts:  1st Attempt  Reason for unsuccessful TCM follow-up call:  Left voice message Patient is schedule for a hospital follow up 05-30-2022 2:40  [place]

## 2022-05-29 NOTE — Telephone Encounter (Signed)
Transition Care Management Unsuccessful Follow-up Telephone Call  Date of discharge and from where:  [facility] 05-22-2022  Attempts:  2nd Attempt  Reason for unsuccessful TCM follow-up call:  Left voice message

## 2022-05-30 ENCOUNTER — Telehealth: Payer: Self-pay | Admitting: Endocrinology

## 2022-05-30 ENCOUNTER — Inpatient Hospital Stay: Payer: Medicare Other | Admitting: Nurse Practitioner

## 2022-05-30 DIAGNOSIS — K700 Alcoholic fatty liver: Secondary | ICD-10-CM

## 2022-05-30 DIAGNOSIS — E1159 Type 2 diabetes mellitus with other circulatory complications: Secondary | ICD-10-CM

## 2022-05-30 DIAGNOSIS — F17219 Nicotine dependence, cigarettes, with unspecified nicotine-induced disorders: Secondary | ICD-10-CM

## 2022-05-30 DIAGNOSIS — E1169 Type 2 diabetes mellitus with other specified complication: Secondary | ICD-10-CM

## 2022-05-30 DIAGNOSIS — J432 Centrilobular emphysema: Secondary | ICD-10-CM

## 2022-05-30 DIAGNOSIS — F319 Bipolar disorder, unspecified: Secondary | ICD-10-CM

## 2022-05-30 DIAGNOSIS — G8929 Other chronic pain: Secondary | ICD-10-CM

## 2022-05-30 DIAGNOSIS — K2100 Gastro-esophageal reflux disease with esophagitis, without bleeding: Secondary | ICD-10-CM

## 2022-05-30 DIAGNOSIS — E1165 Type 2 diabetes mellitus with hyperglycemia: Secondary | ICD-10-CM

## 2022-05-30 DIAGNOSIS — K860 Alcohol-induced chronic pancreatitis: Secondary | ICD-10-CM

## 2022-05-30 DIAGNOSIS — F1020 Alcohol dependence, uncomplicated: Secondary | ICD-10-CM

## 2022-05-30 NOTE — Telephone Encounter (Signed)
Received a call at 5:30 PM regarding blood sugars staying around 350 since about 2 PM today despite taking 7 units of Humulin R U-100 which is sliding scale.  Advised team health nurse to have him give another 7 units Humulin R

## 2022-05-31 ENCOUNTER — Ambulatory Visit (INDEPENDENT_AMBULATORY_CARE_PROVIDER_SITE_OTHER): Payer: Medicare Other | Admitting: Internal Medicine

## 2022-05-31 VITALS — HR 66 | Temp 98.1°F | Ht 68.0 in | Wt 209.4 lb

## 2022-05-31 DIAGNOSIS — E1165 Type 2 diabetes mellitus with hyperglycemia: Secondary | ICD-10-CM | POA: Diagnosis not present

## 2022-05-31 LAB — POCT GLYCOSYLATED HEMOGLOBIN (HGB A1C): Hemoglobin A1C: 7.9 % — AB (ref 4.0–5.6)

## 2022-05-31 MED ORDER — EMPAGLIFLOZIN 25 MG PO TABS: 25.0000 mg | ORAL_TABLET | Freq: Every day | ORAL | 3 refills | Status: DC

## 2022-05-31 MED ORDER — DEXCOM G6 SENSOR MISC: 1.0000 | 3 refills | Status: DC

## 2022-05-31 MED ORDER — TOUJEO MAX SOLOSTAR 300 UNIT/ML ~~LOC~~ SOPN
74.0000 [IU] | PEN_INJECTOR | Freq: Every day | SUBCUTANEOUS | 3 refills | Status: DC
Start: 2022-05-31 — End: 2022-07-21

## 2022-05-31 MED ORDER — NOVOLIN R FLEXPEN 100 UNIT/ML IJ SOPN: PEN_INJECTOR | INTRAMUSCULAR | 6 refills | Status: DC

## 2022-05-31 MED ORDER — DEXCOM G6 TRANSMITTER MISC: 1.0000 | 3 refills | Status: DC

## 2022-05-31 NOTE — Progress Notes (Signed)
Name: Shaun Odonnell  Age/ Sex: 56 y.o., male   MRN/ DOB: [id number], December 20, 1965     PCP: [facility]   Reason for Endocrinology Evaluation: Type 2 Diabetes Mellitus  Initial Endocrine Consultative Visit: 01/05/2022    PATIENT IDENTIFIER: Shaun Odonnell is a 56 y.o. male with a past medical history of T2DM, Hx chronic pancreatitis, schizoaffective disorder, Hx ETOH abuse. The patient has followed with Endocrinology clinic since 01/05/2022 for consultative assistance with management of his diabetes.  DIABETIC HISTORY:  Shaun Odonnell was diagnosed with DM 2006, and started insulin therapy in 2018. His hemoglobin A1c has ranged from 11.9% in 2023, peaking at 12.1% in 2022.   SUBJECTIVE:   During the last visit (05/22/2022): Saw Dr. Loanne Shaun Odonnell   Today (05/31/2022): Shaun Odonnell is here for follow-up on diabetes management.  He checks his blood sugars multiple  times daily, through CGM. The patient has not had hypoglycemic episodes since the last clinic visit  Has a recent bout of pancreatitis requiring an ED visit , symptoms have resolved  HOME DIABETES REGIMEN:  Jardiance 25 mg daily Humulin -N 37 units twice daily Humulin-R (BG -130/30) 3 times daily before every meal   Statin: Yes ACE-I/ARB: No   DEXCOM: Unable to download today    DIABETIC COMPLICATIONS: Microvascular complications:  Cataract sx  Denies: CKD, neuropathy  Last Eye Exam: Completed 06/2021  Macrovascular complications:   Denies: CAD, CVA, PVD   HISTORY:  Past Medical History:  Past Medical History:  Diagnosis Date   Alcohol abuse 04/29/2019   Allergy    Anxiety    ARDS (adult respiratory distress syndrome) (HCC)    Asthma    Bipolar disorder (HCC)    Cataract    Chronic kidney disease    per pt - no current problems   COPD (chronic obstructive pulmonary disease) (HCC)    chronic cough and wheezing   Depression    Diabetes mellitus without complication ([facility])    type 2    Dizziness    medication related   Dyspnea    with exertion   Elevated lipids    Fatty liver    Hypercholesterolemia    Hypertension    per pt, no current prob-no meds   Pancreatitis    Pneumonia    in past   Pulmonary embolism ([place])    Schizoaffective disorder ([place])    Past Surgical History:  Past Surgical History:  Procedure Laterality Date   BIOPSY  08/31/2021   Procedure: BIOPSY;  Surgeon: Rush Landmark Telford Nab., MD;  Location: [place];  Service: Gastroenterology;;   CATARACT EXTRACTION W/PHACO Right 03/26/2018   Procedure: CATARACT EXTRACTION PHACO AND INTRAOCULAR LENS PLACEMENT ([place]) RIGHT BORDERLINE DIABETIC;  Surgeon: Leandrew Koyanagi, MD;  Location: [place];  Service: Ophthalmology;  Laterality: Right;   CATARACT EXTRACTION W/PHACO Left 04/23/2018   Procedure: CATARACT EXTRACTION PHACO AND INTRAOCULAR LENS PLACEMENT ([place])  LEFT BORDERLINE DIABETIC;  Surgeon: Leandrew Koyanagi, MD;  Location: [facility];  Service: Ophthalmology;  Laterality: Left;   COLONOSCOPY     COLONOSCOPY WITH PROPOFOL N/A 08/21/2017   Procedure: COLONOSCOPY WITH PROPOFOL;  Surgeon: Manya Silvas, MD;  Location[facility];  Service: Endoscopy;  Laterality: N/A;   COLONOSCOPY WITH PROPOFOL N/A 12/15/2021   Procedure: COLONOSCOPY WITH BIOPSY;  Surgeon: [name], MD;  Location: [place];  Service: Endoscopy;  Laterality: N/A;  Diabetic   ENDOSCOPIC RETROGRADE CHOLANGIOPANCREATOGRAPHY (ERCP) WITH PROPOFOL N/A 08/31/2021   Procedure: ENDOSCOPIC RETROGRADE  CHOLANGIOPANCREATOGRAPHY (ERCP) WITH PROPOFOL;  Surgeon: Mansouraty, Telford Nab., MD;  Location: [place];  Service: Gastroenterology;  Laterality: N/A;   ESOPHAGOGASTRODUODENOSCOPY (EGD) WITH PROPOFOL N/A 08/31/2021   Procedure: ESOPHAGOGASTRODUODENOSCOPY (EGD) WITH PROPOFOL;  Surgeon: Rush Landmark Telford Nab., MD;  Location: [place];  Service: Gastroenterology;  Laterality: N/A;   EYE SURGERY      FINE NEEDLE ASPIRATION N/A 08/31/2021   Procedure: FINE NEEDLE ASPIRATION (FNA) LINEAR;  Surgeon: Irving Copas., MD;  Location: [place];  Service: Gastroenterology;  Laterality: N/A;   HERNIA REPAIR     inguinal and umbilical   POLYPECTOMY N/A 12/15/2021   Procedure: POLYPECTOMY;  Surgeon: [name], MD;  Location: [place];  Service: Endoscopy;  Laterality: N/A;   RIB RESECTION N/A 08/20/2019   Procedure: removal of xyphoid process;  Surgeon: Nestor Lewandowsky, MD;  Location: [facility];  Service: Thoracic;  Laterality: N/A;   SPHINCTEROTOMY  08/31/2021   Procedure: Joan Mayans;  Surgeon: Mansouraty, Telford Nab., MD;  Location: [place];  Service: Gastroenterology;;   STENT REMOVAL     UMBILICAL HERNIA REPAIR N/A 06/22/2019   Procedure: OPEN HERNIA REPAIR UMBILICAL ADULT;  Surgeon: Olean Ree, MD;  Location: [facility];  Service: General;  Laterality: N/A;   UPPER ESOPHAGEAL ENDOSCOPIC ULTRASOUND (EUS) N/A 08/31/2021   Procedure: UPPER ESOPHAGEAL ENDOSCOPIC ULTRASOUND (EUS);  Surgeon: Irving Copas., MD;  Location: [place];  Service: Gastroenterology;  Laterality: N/A;   UPPER GI ENDOSCOPY     Social History:  reports that he has been smoking cigarettes. He has a 17.00 pack-year smoking history. He quit smokeless tobacco use about 33 years ago.  His smokeless tobacco use included snuff. He reports current alcohol use of about 6.0 standard drinks of alcohol per week. He reports that he does not currently use drugs. Family History:  Family History  Problem Relation Age of Onset   Hyperlipidemia Mother    Hypertension Father    Stroke Father    Diabetes Maternal Aunt    Dementia Maternal Grandmother    Heart disease Maternal Grandfather    Heart disease Paternal Grandmother    Stroke Paternal Grandfather    Diabetes Paternal Grandfather    Colon cancer Neg Hx    Esophageal cancer Neg Hx    Inflammatory bowel disease Neg Hx    Liver  disease Neg Hx    Pancreatic cancer Neg Hx    Stomach cancer Neg Hx    Rectal cancer Neg Hx      HOME MEDICATIONS: Allergies as of 05/31/2022       Reactions   Fentanyl Nausea And Vomiting   Morphine And Related Shortness Of Breath   Clonazepam Other (See Comments)   Other reaction(s): "Dystonic"; alprazolam is a home med Dystonic Reaction   Pimozide Other (See Comments)   Other reaction(s): "Distonic" Dystonic Reaction   Trifluoperazine Other (See Comments)   Other (See Comments) "Distonic" Dystonic Reaction   Azithromycin Other (See Comments)   Was told not to take Z pak due to his heart   Montelukast    headache, nausea, feeling a little anxious, panic   Morphine Other (See Comments)   Other reaction(s): " I stop breathing"; can take Diluadid Cnnot tolerate d/t Drug Metabolism   Tramadol Nausea Only        Medication List        Accurate as of May 31, 2022  1:56 PM. If you have any questions, ask your nurse or doctor.  STOP taking these medications    HumuLIN 70/30 KwikPen (70-30) 100 UNIT/ML KwikPen Generic drug: insulin isophane & regular human KwikPen   HumuLIN N 100 UNIT/ML injection Generic drug: insulin NPH Human   HumuLIN R 100 units/mL injection Generic drug: insulin regular Replaced by: NovoLIN R FlexPen ReliOn 100 UNIT/ML KwikPen       TAKE these medications    acetaminophen 500 MG tablet Commonly known as: TYLENOL Take 1 tablet (500 mg total) by mouth every 6 (six) hours as needed.   albuterol 108 (90 Base) MCG/ACT inhaler Commonly known as: VENTOLIN HFA INHALE 2 PUFFS BY MOUTH EVERY 6 HOURS ASNEEDED WHEEZING/ SHORTNESS OF BREATH   albuterol (2.5 MG/3ML) 0.083% nebulizer solution Commonly known as: PROVENTIL Take 3 mLs (2.5 mg total) by nebulization every 6 (six) hours as needed for wheezing or shortness of breath.   ALPRAZolam 0.5 MG tablet Commonly known as: XANAX Take 0.5 mg by mouth 4 (four) times daily. Patient  takes when wakes up(~0600)/ 1000/ 1600/ 2000   budesonide-formoterol 80-4.5 MCG/ACT inhaler Commonly known as: Symbicort Inhale 2 puffs into the lungs in the morning and at bedtime.   carboxymethylcellulose 0.5 % Soln Commonly known as: REFRESH PLUS Place 2 drops into both eyes daily as needed (Dry eyes).   Dexcom G6 Sensor Misc 1 Device by Does not apply route See admin instructions. Change every 10 days   Dexcom G6 Transmitter Misc   diclofenac Sodium 1 % Gel Commonly known as: Voltaren Apply 2 g topically 4 (four) times daily.   empagliflozin 25 MG Tabs tablet Commonly known as: Jardiance Take 1 tablet (25 mg total) by mouth daily. What changed: See the new instructions.   Eszopiclone 3 MG Tabs Take 3 mg by mouth at bedtime as needed.   fenofibrate 145 MG tablet Commonly known as: TRICOR Take 1 tablet (145 mg total) by mouth daily.   glucose blood test strip Use as instructed   GNP Insulin Syringes 30G X 5/16" 1 ML Misc Generic drug: Insulin Syringe-Needle U-100   ibuprofen 400 MG tablet Commonly known as: ADVIL Take 1 tablet (400 mg total) by mouth every 6 (six) hours as needed.   Ketone Test Strp 1 each by In Vitro route daily in the afternoon.   lipase/protease/amylase 36000 UNITS Cpep capsule Commonly known as: Creon Take 2 capsules (72,000 Units total) by mouth 3 (three) times daily with meals. May also take 1 capsule (36,000 Units total) as needed (with snacks).   NovoLIN R FlexPen ReliOn 100 UNIT/ML KwikPen Generic drug: Insulin Regular Human Max daily 50 units per scale Replaces: HumuLIN R 100 units/mL injection   OLANZapine 20 MG tablet Commonly known as: ZYPREXA Take 20 mg by mouth at bedtime.   omeprazole 40 MG capsule Commonly known as: PRILOSEC TAKE 1 CAPSULE BY MOUTH ONCE DAILY   ondansetron 8 MG disintegrating tablet Commonly known as: ZOFRAN-ODT Take 1 tablet (8 mg total) by mouth every 8 (eight) hours as needed for nausea or  vomiting.   OneTouch Delica Lancets 44I Misc USE TO CHECK FASTING SUGAR TWICE DAILY   sertraline 100 MG tablet Commonly known as: ZOLOFT Take 100 mg by mouth daily.   simvastatin 80 MG tablet Commonly known as: ZOCOR TAKE 1 TABLET BY MOUTH AT BEDTIME   Toujeo Max SoloStar 300 UNIT/ML Solostar Pen Generic drug: insulin glargine (2 Unit Dial) Inject 74 Units into the skin daily in the afternoon.   Ultracare Pen Needles 32G X 4 MM Misc Generic drug: Insulin  Pen Needle Use to inject insulin 2 times daily         OBJECTIVE:   Vital Signs: Pulse 66   Temp 98.1 F (36.7 C)   Ht '5\' 8"'$  (1.727 m)   Wt 209 lb 6.4 oz (95 kg)   SpO2 97%   BMI 31.84 kg/m   Wt Readings from Last 3 Encounters:  05/31/22 209 lb 6.4 oz (95 kg)  05/22/22 212 lb 4.9 oz (96.3 kg)  03/13/22 212 lb 6.4 oz (96.3 kg)     Exam: General: Pt appears well and is in NAD  Neck: General: Supple without adenopathy. Thyroid: Thyroid size normal.  No goiter or nodules appreciated.   Lungs: Clear with good BS bilat with no rales, rhonchi, or wheezes  Heart: RRR   Abdomen: Normoactive bowel sounds, soft, nontender, without masses or organomegaly palpable  Extremities: No pretibial edema.   Neuro: MS is good with appropriate affect, pt is alert and Ox3     DATA REVIEWED:  Lab Results  Component Value Date   HGBA1C 7.9 (A) 05/31/2022   HGBA1C 10.8 (A) 01/05/2022   HGBA1C 11.9 (H) 12/11/2021   Lab Results  Component Value Date   MICROALBUR negative 05/03/2020   LDLCALC 125 (H) 10/18/2021   CREATININE 0.85 05/22/2022   Lab Results  Component Value Date   MICRALBCREAT 24 10/18/2021     Lab Results  Component Value Date   CHOL 247 (H) 10/18/2021   HDL 41 10/18/2021   LDLCALC 125 (H) 10/18/2021   TRIG 455 (H) 10/18/2021   CHOLHDL 6.0 (H) 10/18/2021         ASSESSMENT / PLAN / RECOMMENDATIONS:   1) Type 2 Diabetes Mellitus, Sub-Optimally controlled, Without complications - Most recent A1c of  7.9 %. Goal A1c < 7.0 %.     -His A1c has trended down from 10.8% to 7.9% -He cannot take insulin mix -He would like to switch to insulin pens, he is currently on NPH and regular vials, and a couple times he almost switch taking insulin -Patient has Hx of pancreatitis, hence not a candidate for DPP 4 inhibitors, GLP-1 agonist nor Mounjaro -He was also under the impression that insulin vials can stay at room temperature once opened, but I did explain to the patient that insulin pens may remain at home temperature once open but the vials has to stay refrigerated at all times -I am going to attempt to switch his NPH to Toujeo as below -I will also attempt to prescribe regular insulin in the pen form   MEDICATIONS: Continue Jardiance 25 mg daily Stop NPH Start Toujeo 74 units daily Novolin-R 10 units 3 times daily before every meal Correction factor: Regular insulin (BG -130/25)  EDUCATION / INSTRUCTIONS: BG monitoring instructions: Patient is instructed to check his blood sugars 3 times a day, before each meal. Call [facility] if: BG persistently < 70  I reviewed the Rule of 15 for the treatment of hypoglycemia in detail with the patient. Literature supplied.   2) Diabetic complications:  Eye: Does not have known diabetic retinopathy.  Neuro/ Feet: Does not have known diabetic peripheral neuropathy .  Renal: Patient does not have known baseline CKD. He   is not on an ACEI/ARB at present.   F/U in 4 months   Signed electronically by: Mack Guise, MD  [facility] End[facility]in[facility]  [facility] [address] [place], [place] [address] Phone: [phone] FAX: [phone]   CC:  [facility] [place] [address] Phone: [phone]  Fax: [phone]  Return to Endocrinology clinic as below: Future Appointments  Date Time Provider [place]  06/05/2022  9:20 AM Jon Billings, NP [facility] [place]   06/05/2022 10:40 AM Venita Lick, NP [facility]  07/05/2022  9:15 AM Chesley Mires, MD [facility] None  10/23/2022 11:50 AM Glyndon Tursi, Melanie Crazier, MD [facility] None

## 2022-05-31 NOTE — Patient Instructions (Addendum)
Continue Jardiance 25 mg daily  Switch Humulin- N (NPH) to Toujeo 74 units ONCE daily  Take Novolin -R ( Regular ) 10 units Before each meal  Novolin-R ( Regular  correctional insulin: ADD extra units on insulin to your meal-time Regular insulin  dose if your blood sugars are higher than 155. Use the scale below to help guide you:   Blood sugar before meal Number of units to inject  Less than 155 0 unit  156 -  180 1 units  181 -  205 2 units  206 -  230 3 units  231 -  255 4 units  256 -  280 5 units  281 -  305 6 units  306 -  330 7 units  331 -  355 8 units  256 - 380 10 units

## 2022-06-02 ENCOUNTER — Other Ambulatory Visit: Payer: Self-pay | Admitting: Internal Medicine

## 2022-06-03 NOTE — Patient Instructions (Signed)

## 2022-06-04 ENCOUNTER — Other Ambulatory Visit: Payer: Self-pay | Admitting: Internal Medicine

## 2022-06-04 ENCOUNTER — Encounter: Payer: Self-pay | Admitting: Nurse Practitioner

## 2022-06-04 ENCOUNTER — Ambulatory Visit: Payer: Medicare Other | Admitting: Nurse Practitioner

## 2022-06-04 NOTE — Progress Notes (Deleted)
   There were no vitals taken for this visit.   Subjective:    Patient ID: CELVIN TANEY, male    DOB: 1966-03-25, 56 y.o.   MRN: [id number]  HPI: KONSTANTINOS CORDOBA is a 56 y.o. male  No chief complaint on file.  HYPERTENSION / HYPERLIPIDEMIA Satisfied with current treatment? {Blank single:19197::"yes","no"} Duration of hypertension: {Blank single:19197::"chronic","months","years"} BP monitoring frequency: {Blank single:19197::"not checking","rarely","daily","weekly","monthly","a few times a day","a few times a week","a few times a month"} BP range:  BP medication side effects: {Blank single:19197::"yes","no"} Past BP meds: {Blank MVHQIONG:29528::"UXLK","GMWNUUVOZD","GUYQIHKVQQ/VZDGLOVFIE","PPIRJJOA","CZYSAYTKZS","WFUXNATFTD/DUKG","URKYHCWCBJ (bystolic)","carvedilol","chlorthalidone","clonidine","diltiazem","exforge HCT","HCTZ","irbesartan (avapro)","labetalol","lisinopril","lisinopril-HCTZ","losartan (cozaar)","methyldopa","nifedipine","olmesartan (benicar)","olmesartan-HCTZ","quinapril","ramipril","spironalactone","tekturna","valsartan","valsartan-HCTZ","verapamil"} Duration of hyperlipidemia: {Blank single:19197::"chronic","months","years"} Cholesterol medication side effects: {Blank single:19197::"yes","no"} Cholesterol supplements: {Blank multiple:19196::"none","fish oil","niacin","red yeast rice"} Past cholesterol medications: {Blank multiple:19196::"none","atorvastain (lipitor)","lovastatin (mevacor)","pravastatin (pravachol)","rosuvastatin (crestor)","simvastatin (zocor)","vytorin","fenofibrate (tricor)","gemfibrozil","ezetimide (zetia)","niaspan","lovaza"} Medication compliance: {Blank single:19197::"excellent compliance","good compliance","fair compliance","poor compliance"} Aspirin: {Blank single:19197::"yes","no"} Recent stressors: {Blank single:19197::"yes","no"} Recurrent headaches: {Blank single:19197::"yes","no"} Visual changes: {Blank  single:19197::"yes","no"} Palpitations: {Blank single:19197::"yes","no"} Dyspnea: {Blank single:19197::"yes","no"} Chest pain: {Blank single:19197::"yes","no"} Lower extremity edema: {Blank single:19197::"yes","no"} Dizzy/lightheaded: {Blank single:19197::"yes","no"}  DIABETES Last A1c was 7.9%.  Hypoglycemic episodes:{Blank single:19197::"yes","no"} Polydipsia/polyuria: {Blank single:19197::"yes","no"} Visual disturbance: {Blank single:19197::"yes","no"} Chest pain: {Blank single:19197::"yes","no"} Paresthesias: {Blank single:19197::"yes","no"} Glucose Monitoring: {Blank single:19197::"yes","no"}  Accucheck frequency: {Blank single:19197::"Not Checking","Daily","BID","TID"}  Fasting glucose:  Post prandial:  Evening:  Before meals: Taking Insulin?: {Blank single:19197::"yes","no"}  Long acting insulin:  Short acting insulin: Blood Pressure Monitoring: {Blank single:19197::"not checking","rarely","daily","weekly","monthly","a few times a day","a few times a week","a few times a month"} Retinal Examination: {Blank single:19197::"Up to Date","Not up to Date"} Foot Exam: {Blank single:19197::"Up to Date","Not up to Date"} Diabetic Education: {Blank single:19197::"Completed","Not Completed"} Pneumovax: {Blank single:19197::"Up to Date","Not up to Date","unknown"} Influenza: {Blank single:19197::"Up to Date","Not up to Date","unknown"} Aspirin: {Blank single:19197::"yes","no"}  CHRONIC PANCREATITIS Was seen in the ER on 7/18 with abdominal pain.    Relevant past medical, surgical, family and social history reviewed and updated as indicated. Interim medical history since our last visit reviewed. Allergies and medications reviewed and updated.  Review of Systems  Per HPI unless specifically indicated above     Objective:    There were no vitals taken for this visit.  Wt Readings from Last 3 Encounters:  05/31/22 209 lb 6.4 oz (95 kg)  05/22/22 212 lb 4.9 oz (96.3 kg)   03/13/22 212 lb 6.4 oz (96.3 kg)    Physical Exam  Results for orders placed or performed in visit on 05/31/22  POCT glycosylated hemoglobin (Hb A1C)  Result Value Ref Range   Hemoglobin A1C 7.9 (A) 4.0 - 5.6 %   HbA1c POC (<> result, manual entry)     HbA1c, POC (prediabetic range)     HbA1c, POC (controlled diabetic range)        Assessment & Plan:   Problem List Items Addressed This Visit       Cardiovascular and Mediastinum   Paroxysmal SVT (supraventricular tachycardia) ([place]) - Primary   Hypertension associated with diabetes ([place])     Respiratory   Chronic obstructive pulmonary disease ([place])     Endocrine   Type 2 diabetes mellitus with hyperglycemia, without long-term current use of insulin ([place])   Hyperlipidemia associated with type 2 diabetes mellitus ([place])     Other   Bipolar depression ([place])   Alcohol use disorder, moderate, dependence ([place])   Drug abuse, opioid type ([place])     Follow up plan: No follow-ups on file.

## 2022-06-05 ENCOUNTER — Encounter: Payer: Self-pay | Admitting: Nurse Practitioner

## 2022-06-05 ENCOUNTER — Other Ambulatory Visit (HOSPITAL_COMMUNITY): Payer: Self-pay

## 2022-06-05 ENCOUNTER — Ambulatory Visit (INDEPENDENT_AMBULATORY_CARE_PROVIDER_SITE_OTHER): Payer: Medicare Other | Admitting: Nurse Practitioner

## 2022-06-05 ENCOUNTER — Telehealth: Payer: Self-pay | Admitting: Pharmacy Technician

## 2022-06-05 ENCOUNTER — Telehealth: Payer: Self-pay

## 2022-06-05 ENCOUNTER — Ambulatory Visit: Payer: Medicare Other | Admitting: Nurse Practitioner

## 2022-06-05 VITALS — BP 124/74 | HR 71 | Temp 97.5°F | Ht 68.0 in | Wt 206.6 lb

## 2022-06-05 DIAGNOSIS — J449 Chronic obstructive pulmonary disease, unspecified: Secondary | ICD-10-CM | POA: Diagnosis not present

## 2022-06-05 DIAGNOSIS — F17219 Nicotine dependence, cigarettes, with unspecified nicotine-induced disorders: Secondary | ICD-10-CM | POA: Diagnosis not present

## 2022-06-05 DIAGNOSIS — K2100 Gastro-esophageal reflux disease with esophagitis, without bleeding: Secondary | ICD-10-CM | POA: Diagnosis not present

## 2022-06-05 DIAGNOSIS — K860 Alcohol-induced chronic pancreatitis: Secondary | ICD-10-CM

## 2022-06-05 DIAGNOSIS — I152 Hypertension secondary to endocrine disorders: Secondary | ICD-10-CM

## 2022-06-05 DIAGNOSIS — E1159 Type 2 diabetes mellitus with other circulatory complications: Secondary | ICD-10-CM

## 2022-06-05 DIAGNOSIS — E1165 Type 2 diabetes mellitus with hyperglycemia: Secondary | ICD-10-CM

## 2022-06-05 DIAGNOSIS — F1111 Opioid abuse, in remission: Secondary | ICD-10-CM | POA: Insufficient documentation

## 2022-06-05 DIAGNOSIS — F1020 Alcohol dependence, uncomplicated: Secondary | ICD-10-CM

## 2022-06-05 DIAGNOSIS — I471 Supraventricular tachycardia: Secondary | ICD-10-CM

## 2022-06-05 DIAGNOSIS — F319 Bipolar disorder, unspecified: Secondary | ICD-10-CM

## 2022-06-05 DIAGNOSIS — E785 Hyperlipidemia, unspecified: Secondary | ICD-10-CM | POA: Diagnosis not present

## 2022-06-05 DIAGNOSIS — E1169 Type 2 diabetes mellitus with other specified complication: Secondary | ICD-10-CM | POA: Diagnosis not present

## 2022-06-05 LAB — MICROALBUMIN, URINE WAIVED
Creatinine, Urine Waived: 50 mg/dL (ref 10–300)
Microalb, Ur Waived: 10 mg/L (ref 0–19)
Microalb/Creat Ratio: <30 mg/g (ref <30)

## 2022-06-05 NOTE — Assessment & Plan Note (Signed)
Chronic, ongoing, followed by endocrinology -- recent A1c with them 7.9%.  Urine ALB 10 on check today (August 2023).  At this time continue collaboration with endo and current regimen as ordered by them.  Recommend he check BS 3-4 times daily and document for visits or bring monitor.  Return in 6 months.

## 2022-06-05 NOTE — Assessment & Plan Note (Addendum)
Chronic, ongoing with intermittent flares.  Last flare 05/22/22 -- he denies heavy alcohol use over past year -- occasional beer only.  Will check on referral to pain clinic, reached out to referral coordinator on this -- she will send to Dr. Primus Bravo in [place].  Would benefit from pain management and continue to collaborate with GI.  Recheck labs today.  With his benzo use he is not a good candidate for long term opioid use, which have discussed with him.

## 2022-06-05 NOTE — Telephone Encounter (Signed)
Patient Advocate Encounter   Received notification from Office that prior authorization for Novolin R Flexpen is required/requested.   PA submitted on 06/05/22 to [facility] via [place] Status is pending  Pharmacy Patient Advocate Fax:  [phone]

## 2022-06-05 NOTE — Assessment & Plan Note (Signed)
Chronic, ongoing.  Not currently using inhalers.  Is followed by pulmonary, continue this collaboration.  Recommend annual lung cancer screening and educated him on this, he is interested and order placed.

## 2022-06-05 NOTE — Telephone Encounter (Signed)
Left HIPAA compliant VM for pt. Just letting him know we were working on getting the PA issue resolved.

## 2022-06-05 NOTE — Telephone Encounter (Signed)
Patient Advocate Encounter  Prior Authorization for Novolin R has been approved.    PA# PA Case ID: [id number] Effective dates:  through 11/04/22  Per Test Claim Patients co-pay is $0 .   Spoke with Pharmacy to Process.  Patient Advocate Fax:  [phone]

## 2022-06-05 NOTE — Assessment & Plan Note (Signed)
Chronic, denies heavy alcohol use in over a year.  Does have occasional beer.  Recommend complete cessation of alcohol use for pancreas health and overall health.

## 2022-06-05 NOTE — Assessment & Plan Note (Signed)
Chronic, ongoing.  Followed by psychiatry, continue this collaboration and medication regimen as prescribed by them.

## 2022-06-05 NOTE — Assessment & Plan Note (Signed)
Chronic, ongoing.  Continue current medication regimen and adjust as needed.  Monitor triglycerides closely with his chronic pancreatitis.

## 2022-06-05 NOTE — Progress Notes (Addendum)
BP 124/74   Pulse 71   Temp (!) 97.5 F (36.4 C) (Oral)   Ht 5' 8"  (1.727 m)   Wt 206 lb 9.6 oz (93.7 kg)   SpO2 94%   BMI 31.41 kg/m    Subjective:    Patient ID: Shaun Odonnell, male    DOB: Mar 17, 1966, 56 y.o.   MRN: [id number]  HPI: [name] is a 56 y.o. male  Chief Complaint  Patient presents with   Diabetes    Patient says he is scheduled for an upcoming appointment in a couple of weeks.    Hyperlipidemia   Hypertension   COPD   Gastroesophageal Reflux   DIABETES Followed by endocrinology with last visit 05/31/22.  They stopped Trulicity due to pancreatitis and started Jardiance 25 MG daily.  Recent A1c with them was 7.9%.   Hypoglycemic episodes: x 1 over past 3 weeks Polydipsia/polyuria: no Visual disturbance: no Chest pain: no Paresthesias: no Glucose Monitoring: yes  Accucheck frequency: TID  Fasting glucose: 120 range  Post prandial:  Evening: Varies, sometimes elevated, normally around 170 range  Before meals: Taking Insulin?: yes  Long acting insulin: 74 units Toujeo  Short acting insulin: Humalin R on sliding scale Blood Pressure Monitoring: not checking Retinal Examination: Not up to Date == scheduled [facility] upcoming Foot Exam: Up to Date Pneumovax: Up to Date Influenza: Up to Date Aspirin: no   HYPERTENSION / HYPERLIPIDEMIA No BP medications at this time.  Taking Fenofibrate and Simvastatin daily. Satisfied with current treatment? yes Duration of hypertension: chronic BP monitoring frequency: not checking BP range:  BP medication side effects: no Past BP meds: unknown Duration of hyperlipidemia: chronic Cholesterol medication side effects: no Cholesterol supplements: none Past cholesterol medications: unknown Medication compliance: good compliance Aspirin: no Recent stressors: no Recurrent headaches: no Visual changes: no Palpitations: no Dyspnea: no Chest pain: no Lower extremity edema: no Dizzy/lightheaded: no    COPD He is a past smoker, quit 3 months ago.  Smoked for 30 years, was about 2 PPD.  He did see pulmonary in past, last 12/04/21 -- used Symbicort and Albuterol at one time.  Returns to pulmonary end of this month. COPD status: stable Satisfied with current treatment?: yes Oxygen use: no Dyspnea frequency: none Cough frequency: none Rescue inhaler frequency:   Limitation of activity: no Productive cough: none Last Spirometry: with pulmonary Pneumovax: Up to Date Influenza: Up to Date   GERD Followed by GI with last visit 11/22/21.  Recent hospitalization for possible acute pancreatitis issue, has chronic pancreatitis with history of stenting which were them later removed with stones.  Was given short burst of Oxycodone in ER on 05/22/22.  Continues on Omeprazole for GERD.  Does have history of heavier alcohol use, last > 1 year ago.  Drank about 9-12 beers a day at that time.  At this time reports no heavy alcohol use, has two bottles of beer on hand to drink on occasion.  Was to see pain clinic for chronic pain issues with his chronic pancreatitis -- referral placed 03/13/22 -- has not heard from anyone, he is looking for oral treatment from pain clinic vs injections. GERD control status: stable Satisfied with current treatment? yes Heartburn frequency: none Medication side effects: no  Medication compliance: stable Dysphagia: no Odynophagia:  no Hematemesis: no Blood in stool: no EGD: yes   BIPOLAR DISORDER Currently followed by [place].  Last visit a few months ago. Dealing at this time with his  father who is ill.  Mood status: stable Satisfied with current treatment?: yes Symptom severity: moderate  Duration of current treatment : chronic Side effects: no Medication compliance: good compliance Psychotherapy/counseling: none Depressed mood: no Anxious mood: no Anhedonia: no Significant weight loss or gain: no Insomnia: occasional Fatigue: no Feelings of  worthlessness or guilt: no Impaired concentration/indecisiveness: no Suicidal ideations: no Hopelessness: no Crying spells: no    06/05/2022   10:51 AM 03/13/2022   10:49 AM 01/10/2022    9:09 AM 12/11/2021    3:45 PM 11/30/2021    2:29 PM  Depression screen PHQ 2/9  Decreased Interest 1 1 1 1 1   Down, Depressed, Hopeless 1 1 1  0 1  PHQ - 2 Score 2 2 2 1 2   Altered sleeping 0 0 0 1 1  Tired, decreased energy 1 1 1 1 1   Change in appetite 0 0 0 0 0  Feeling bad or failure about yourself  0 0 0 0 0  Trouble concentrating 0 1 1 1 1   Moving slowly or fidgety/restless 0 0 0 0 0  Suicidal thoughts 0 0 0 0 0  PHQ-9 Score 3 4 4 4 5   Difficult doing work/chores Not difficult at all Not difficult at all Not difficult at all  Not difficult at all       06/05/2022   10:51 AM 03/13/2022   10:49 AM 01/10/2022    9:09 AM 12/11/2021    3:50 PM  GAD 7 : Generalized Anxiety Score  Nervous, Anxious, on Edge 0 0 1 0  Control/stop worrying 0 0 1 0  Worry too much - different things 1 0 1 0  Trouble relaxing 0 0 1 1  Restless 0 0 0 0  Easily annoyed or irritable 1 1 1  0  Afraid - awful might happen 0 0 0 0  Total GAD 7 Score 2 1 5 1   Anxiety Difficulty Not difficult at all Not difficult at all Not difficult at all Not difficult at all      Relevant past medical, surgical, family and social history reviewed and updated as indicated. Interim medical history since our last visit reviewed. Allergies and medications reviewed and updated.  Review of Systems  Constitutional:  Negative for activity change, diaphoresis, fatigue and fever.  Respiratory:  Negative for cough, chest tightness, shortness of breath and wheezing.   Cardiovascular:  Negative for chest pain, palpitations and leg swelling.  Gastrointestinal: Negative.   Endocrine: Negative for cold intolerance, heat intolerance, polydipsia, polyphagia and polyuria.  Neurological:  Negative for dizziness, syncope, weakness, light-headedness, numbness and  headaches.  Psychiatric/Behavioral: Negative.      Per HPI unless specifically indicated above     Objective:    BP 124/74   Pulse 71   Temp (!) 97.5 F (36.4 C) (Oral)   Ht 5' 8"  (1.727 m)   Wt 206 lb 9.6 oz (93.7 kg)   SpO2 94%   BMI 31.41 kg/m   Wt Readings from Last 3 Encounters:  06/05/22 206 lb 9.6 oz (93.7 kg)  05/31/22 209 lb 6.4 oz (95 kg)  05/22/22 212 lb 4.9 oz (96.3 kg)    Physical Exam Vitals and nursing note reviewed.  Constitutional:      General: He is awake. He is not in acute distress.    Appearance: He is well-developed and well-groomed. He is obese. He is not ill-appearing or toxic-appearing.  HENT:     Head: Normocephalic and atraumatic.  Right Ear: Hearing normal. No drainage.     Left Ear: Hearing normal. No drainage.  Eyes:     General: Lids are normal.        Right eye: No discharge.        Left eye: No discharge.     Conjunctiva/sclera: Conjunctivae normal.     Pupils: Pupils are equal, round, and reactive to light.  Neck:     Thyroid: No thyromegaly.     Vascular: No carotid bruit.  Cardiovascular:     Rate and Rhythm: Normal rate and regular rhythm.     Heart sounds: Normal heart sounds, S1 normal and S2 normal. No murmur heard.    No gallop.  Pulmonary:     Effort: Pulmonary effort is normal. No accessory muscle usage or respiratory distress.     Breath sounds: Normal breath sounds.  Abdominal:     General: Bowel sounds are normal. There is no distension.     Palpations: Abdomen is soft. There is no hepatomegaly or splenomegaly.     Tenderness: There is no abdominal tenderness.  Musculoskeletal:        General: Normal range of motion.     Cervical back: Normal range of motion and neck supple.     Right lower leg: No edema.     Left lower leg: No edema.  Lymphadenopathy:     Cervical: No cervical adenopathy.  Skin:    General: Skin is warm and dry.     Capillary Refill: Capillary refill takes less than 2 seconds.   Neurological:     Mental Status: He is alert and oriented to person, place, and time.     Deep Tendon Reflexes: Reflexes are normal and symmetric.  Psychiatric:        Mood and Affect: Mood normal.        Speech: Speech normal.        Behavior: Behavior normal. Behavior is cooperative.        Thought Content: Thought content normal.    Diabetic Foot Exam - Simple   Simple Foot Form Visual Inspection No deformities, no ulcerations, no other skin breakdown bilaterally: Yes Sensation Testing Intact to touch and monofilament testing bilaterally: Yes Pulse Check Posterior Tibialis and Dorsalis pulse intact bilaterally: Yes Comments      Results for orders placed or performed in visit on 06/05/22  Microalbumin, Urine Waived  Result Value Ref Range   Microalb, Ur Waived 10 0 - 19 mg/L   Creatinine, Urine Waived 50 10 - 300 mg/dL   Microalb/Creat Ratio <30 <30 mg/g  CBC with Differential/Platelet  Result Value Ref Range   WBC 7.4 3.4 - 10.8 x10E3/uL   RBC 5.25 4.14 - 5.80 x10E6/uL   Hemoglobin 15.8 13.0 - 17.7 g/dL   Hematocrit 48.7 37.5 - 51.0 %   MCV 93 79 - 97 fL   MCH 30.1 26.6 - 33.0 pg   MCHC 32.4 31.5 - 35.7 g/dL   RDW 12.9 11.6 - 15.4 %   Platelets 253 150 - 450 x10E3/uL   Neutrophils 68 Not Estab. %   Lymphs 19 Not Estab. %   Monocytes 6 Not Estab. %   Eos 5 Not Estab. %   Basos 1 Not Estab. %   Neutrophils Absolute 5.1 1.4 - 7.0 x10E3/uL   Lymphocytes Absolute 1.4 0.7 - 3.1 x10E3/uL   Monocytes Absolute 0.4 0.1 - 0.9 x10E3/uL   EOS (ABSOLUTE) 0.4 0.0 - 0.4 x10E3/uL   Basophils Absolute 0.1  0.0 - 0.2 x10E3/uL   Immature Granulocytes 1 Not Estab. %   Immature Grans (Abs) 0.1 0.0 - 0.1 x10E3/uL  Comprehensive metabolic panel  Result Value Ref Range   Glucose 187 (H) 70 - 99 mg/dL   BUN 15 6 - 24 mg/dL   Creatinine, Ser 0.92 0.76 - 1.27 mg/dL   eGFR 99 >59 mL/min/1.73   BUN/Creatinine Ratio 16 9 - 20   Sodium 138 134 - 144 mmol/L   Potassium 4.6 3.5 - 5.2  mmol/L   Chloride 97 96 - 106 mmol/L   CO2 25 20 - 29 mmol/L   Calcium 9.8 8.7 - 10.2 mg/dL   Total Protein 7.5 6.0 - 8.5 g/dL   Albumin 4.7 3.8 - 4.9 g/dL   Globulin, Total 2.8 1.5 - 4.5 g/dL   Albumin/Globulin Ratio 1.7 1.2 - 2.2   Bilirubin Total 0.3 0.0 - 1.2 mg/dL   Alkaline Phosphatase 108 44 - 121 IU/L   AST 19 0 - 40 IU/L   ALT 21 0 - 44 IU/L  Lipid Panel w/o Chol/HDL Ratio  Result Value Ref Range   Cholesterol, Total 200 (H) 100 - 199 mg/dL   Triglycerides 258 (H) 0 - 149 mg/dL   HDL 54 >39 mg/dL   VLDL Cholesterol Cal 44 (H) 5 - 40 mg/dL   LDL Chol Calc (NIH) 102 (H) 0 - 99 mg/dL  TSH  Result Value Ref Range   TSH 0.598 0.450 - 4.500 uIU/mL  Vitamin B12  Result Value Ref Range   Vitamin B-12 676 232 - 1,245 pg/mL  Magnesium  Result Value Ref Range   Magnesium 2.3 1.6 - 2.3 mg/dL  Lipase  Result Value Ref Range   Lipase 159 (H) 13 - 78 U/L  Amylase  Result Value Ref Range   Amylase 123 (H) 31 - 110 U/L      Assessment & Plan:   Problem List Items Addressed This Visit       Cardiovascular and Mediastinum   Hypertension associated with diabetes ([place])    Chronic, stable with no current medication and BP at goal today.  Recommend she monitor BP at least a few mornings a week at home and document.  DASH diet at home.  Would benefit from addition of ARB in future for kidney protection, avoid ACE due to COPD. Labs today: CBC, CMP, Urine ALB, and Lipid + TSH.  Return in 6 months.       Relevant Orders   Microalbumin, Urine Waived (Completed)   CBC with Differential/Platelet (Completed)     Respiratory   Chronic obstructive pulmonary disease (HCC)    Chronic, ongoing.  Not currently using inhalers.  Is followed by pulmonary, continue this collaboration.  Recommend annual lung cancer screening and educated him on this, he is interested and order placed.      Relevant Orders   Ambulatory Referral Lung Cancer Screening [facility]     Digestive    Alcohol-induced chronic pancreatitis ([place])    Chronic, ongoing with intermittent flares.  Last flare 05/22/22 -- he denies heavy alcohol use over past year -- occasional beer only.  Will check on referral to pain clinic, reached out to referral coordinator on this -- she will send to Dr. Primus Bravo in [place].  Would benefit from pain management and continue to collaborate with GI.  Recheck labs today.  With his benzo use he is not a good candidate for long term opioid use, which have discussed with him.  Relevant Orders   Comprehensive metabolic panel (Completed)   Lipase (Completed)   Amylase (Completed)   Gastroesophageal reflux disease with esophagitis without hemorrhage    Chronic, ongoing.  Well-controlled with PPI.  Risks of PPI use were discussed with patient including bone loss, C. Diff diarrhea, pneumonia, infections, CKD, electrolyte abnormalities. Verbalizes understanding and chooses to continue the medication.  Mag level today.      Relevant Orders   Magnesium (Completed)     Endocrine   Hyperlipidemia associated with type 2 diabetes mellitus (HCC)    Chronic, ongoing.  Continue current medication regimen and adjust as needed.  Monitor triglycerides closely with his chronic pancreatitis.        Relevant Orders   Microalbumin, Urine Waived (Completed)   Comprehensive metabolic panel (Completed)   Lipid Panel w/o Chol/HDL Ratio (Completed)   Type 2 diabetes mellitus with hyperglycemia, without long-term current use of insulin (HCC) - Primary    Chronic, ongoing, followed by endocrinology -- recent A1c with them 7.9%.  Urine ALB 10 on check today (August 2023).  At this time continue collaboration with endo and current regimen as ordered by them.  Recommend he check BS 3-4 times daily and document for visits or bring monitor.  Return in 6 months.      Relevant Orders   Microalbumin, Urine Waived (Completed)   TSH (Completed)     Nervous and Auditory   Nicotine dependence,  cigarettes, w unsp disorders    Quit 3 months ago, recommend continued cessation -- will placed referral to lung cancer screening program.      Relevant Orders   Ambulatory Referral Lung Cancer Screening [facility]     Other   Alcohol use disorder, moderate, dependence ([place])    Chronic, denies heavy alcohol use in over a year.  Does have occasional beer.  Recommend complete cessation of alcohol use for pancreas health and overall health.      Relevant Orders   Vitamin B12 (Completed)   Bipolar depression (HCC)    Chronic, ongoing.  Followed by psychiatry, continue this collaboration and medication regimen as prescribed by them.        Follow up plan: Return in about 6 months (around 12/06/2022) for T2DM, HTN/HLD, GERD, PANCREATITIS, MOOD, PAIN.

## 2022-06-05 NOTE — Assessment & Plan Note (Signed)
Quit 3 months ago, recommend continued cessation -- will placed referral to lung cancer screening program.

## 2022-06-05 NOTE — Assessment & Plan Note (Signed)
Chronic, stable with no current medication and BP at goal today.  Recommend she monitor BP at least a few mornings a week at home and document.  DASH diet at home.  Would benefit from addition of ARB in future for kidney protection, avoid ACE due to COPD. Labs today: CBC, CMP, Urine ALB, and Lipid + TSH.  Return in 6 months.

## 2022-06-05 NOTE — Telephone Encounter (Signed)
Patient called in states he needs PA for Novolin R. Please start PA

## 2022-06-05 NOTE — Assessment & Plan Note (Signed)
Chronic, ongoing.  Well-controlled with PPI.  Risks of PPI use were discussed with patient including bone loss, C. Diff diarrhea, pneumonia, infections, CKD, electrolyte abnormalities. Verbalizes understanding and chooses to continue the medication.  Mag level today.

## 2022-06-06 ENCOUNTER — Ambulatory Visit: Payer: Self-pay | Admitting: *Deleted

## 2022-06-06 ENCOUNTER — Encounter: Payer: Self-pay | Admitting: Nurse Practitioner

## 2022-06-06 LAB — COMPREHENSIVE METABOLIC PANEL
ALT: 21 IU/L (ref 0–44)
AST: 19 IU/L (ref 0–40)
Albumin/Globulin Ratio: 1.7 (ref 1.2–2.2)
Albumin: 4.7 g/dL (ref 3.8–4.9)
Alkaline Phosphatase: 108 IU/L (ref 44–121)
BUN/Creatinine Ratio: 16 (ref 9–20)
BUN: 15 mg/dL (ref 6–24)
Bilirubin Total: 0.3 mg/dL (ref 0.0–1.2)
CO2: 25 mmol/L (ref 20–29)
Calcium: 9.8 mg/dL (ref 8.7–10.2)
Chloride: 97 mmol/L (ref 96–106)
Creatinine, Ser: 0.92 mg/dL (ref 0.76–1.27)
Globulin, Total: 2.8 g/dL (ref 1.5–4.5)
Glucose: 187 mg/dL — ABNORMAL HIGH (ref 70–99)
Potassium: 4.6 mmol/L (ref 3.5–5.2)
Sodium: 138 mmol/L (ref 134–144)
Total Protein: 7.5 g/dL (ref 6.0–8.5)
eGFR: 99 mL/min/{1.73_m2} (ref >59)

## 2022-06-06 LAB — LIPID PANEL W/O CHOL/HDL RATIO
Cholesterol, Total: 200 mg/dL — ABNORMAL HIGH (ref 100–199)
HDL: 54 mg/dL (ref >39)
LDL Chol Calc (NIH): 102 mg/dL — ABNORMAL HIGH (ref 0–99)
Triglycerides: 258 mg/dL — ABNORMAL HIGH (ref 0–149)
VLDL Cholesterol Cal: 44 mg/dL — ABNORMAL HIGH (ref 5–40)

## 2022-06-06 LAB — CBC WITH DIFFERENTIAL/PLATELET
Basophils Absolute: 0.1 10*3/uL (ref 0.0–0.2)
Basos: 1 %
EOS (ABSOLUTE): 0.4 10*3/uL (ref 0.0–0.4)
Eos: 5 %
Hematocrit: 48.7 % (ref 37.5–51.0)
Hemoglobin: 15.8 g/dL (ref 13.0–17.7)
Immature Grans (Abs): 0.1 10*3/uL (ref 0.0–0.1)
Immature Granulocytes: 1 %
Lymphocytes Absolute: 1.4 10*3/uL (ref 0.7–3.1)
Lymphs: 19 %
MCH: 30.1 pg (ref 26.6–33.0)
MCHC: 32.4 g/dL (ref 31.5–35.7)
MCV: 93 fL (ref 79–97)
Monocytes Absolute: 0.4 10*3/uL (ref 0.1–0.9)
Monocytes: 6 %
Neutrophils Absolute: 5.1 10*3/uL (ref 1.4–7.0)
Neutrophils: 68 %
Platelets: 253 10*3/uL (ref 150–450)
RBC: 5.25 x10E6/uL (ref 4.14–5.80)
RDW: 12.9 % (ref 11.6–15.4)
WBC: 7.4 10*3/uL (ref 3.4–10.8)

## 2022-06-06 LAB — TSH: TSH: 0.598 u[IU]/mL (ref 0.450–4.500)

## 2022-06-06 LAB — AMYLASE: Amylase: 123 U/L — ABNORMAL HIGH (ref 31–110)

## 2022-06-06 LAB — VITAMIN B12: Vitamin B-12: 676 pg/mL (ref 232–1245)

## 2022-06-06 LAB — LIPASE: Lipase: 159 U/L — ABNORMAL HIGH (ref 13–78)

## 2022-06-06 LAB — MAGNESIUM: Magnesium: 2.3 mg/dL (ref 1.6–2.3)

## 2022-06-06 NOTE — Progress Notes (Signed)
Contacted via MyChart   Good evening Shaun Odonnell, your labs have returned: - Kidney function, creatinine and eGFR, remains normal, as is liver function, AST and ALT.   - Cholesterol levels are above goal, are you taking your Simvastatin daily?  If so I recommend we switch this to Rosuvastatin and monitor closely as Simvastatin is not lowering levels to goal range. - Lipase and amylase are elevated -- these are your pancreas labs.  When do you return to GI?  I highly recommend you schedule visit ASAP to work on your chronic pancreatitis.  Also no alcohol use, even if hurting, as this could worsen your chronic pancreas issues and lead to more harm.  If worsening pain presents then immediately head into ER for fluids and assessment  Focus on more liquid diet at this time for the next couple of days.   - Remainder of labs are all stable.  Any questions?

## 2022-06-06 NOTE — Telephone Encounter (Signed)
Called and left vm for patient.

## 2022-06-06 NOTE — Telephone Encounter (Signed)
Reason for Disposition  Prescription request for new medicine (not a refill)  Answer Assessment - Initial Assessment Questions 1. LOCATION: "Where does it hurt?"      Upper abdominal pain-midline 2. RADIATION: "Does the pain shoot anywhere else?" (e.g., chest, back)     No  3. ONSET: "When did the pain begin?" (Minutes, hours or days ago)      05/22/22- pancreatitis 4. SUDDEN: "Gradual or sudden onset?"     Gradual- yesterday was hurting a little 5. PATTERN "Does the pain come and go, or is it constant?"    - If it comes and goes: "How long does it last?" "Do you have pain now?"     (Note: Comes and goes means the pain is intermittent. It goes away completely between bouts.)    - If constant: "Is it getting better, staying the same, or getting worse?"      (Note: Constant means the pain never goes away completely; most serious pain is constant and gets worse.)      constant 6. SEVERITY: "How bad is the pain?"  (e.g., Scale 1-10; mild, moderate, or severe)    - MILD (1-3): Doesn't interfere with normal activities, abdomen soft and not tender to touch.     - MODERATE (4-7): Interferes with normal activities or awakens from sleep, abdomen tender to touch.     - SEVERE (8-10): Excruciating pain, doubled over, unable to do any normal activities.       Mild/moderate 7. RECURRENT SYMPTOM: "Have you ever had this type of stomach pain before?" If Yes, ask: "When was the last time?" and "What happened that time?"      Yes- hx pancreatitis  8. CAUSE: "What do you think is causing the stomach pain?"     above 9. RELIEVING/AGGRAVATING FACTORS: "What makes it better or worse?" (e.g., antacids, bending or twisting motion, bowel movement)     Using Tylenol/Ibuprofen 10. OTHER SYMPTOMS: "Do you have any other symptoms?" (e.g., back pain, diarrhea, fever, urination pain, vomiting)       no  Answer Assessment - Initial Assessment Questions 1. NAME of MEDICINE: "What medicine(s) are you calling about?"      Pain medication- Oxycodone 2. QUESTION: "What is your question?" (e.g., double dose of medicine, side effect)     Patient states he has reached out to pain clinic also- still waiting to hear back.  3. PRESCRIBER: "Who prescribed the medicine?" Reason: if prescribed by specialist, call should be referred to that group.       4. SYMPTOMS: "Do you have any symptoms?" If Yes, ask: "What symptoms are you having?"  "How bad are the symptoms (e.g., mild, moderate, severe)     Increased pain today- more than yesterday- concerned about labs and taking Tylenol/Ibuprofen- is requesting short term pain medication until he can be seen at pain clinic  Patient was seen in office yesterday: Reports more pain today- requesting short term pain medication Inquiring about ABN labs Patient has left message at pain clinic- no answer- just machine both time he called  Protocols used: Abdominal Pain - Male-A-AH, Medication Question Call-A-AH

## 2022-06-06 NOTE — Telephone Encounter (Signed)
Pt called, is begging for short supply of pain medicine d/t being unable to come in when he is taking care of his father who have a bad stroke and having to help transport his mother back and forth and staying roughing 10 hrs. Pt was advised that he doesn't understand why he needs another visit when he was there yesterday and has lab results that show elevated lipase and triglycerides. Pt has been dealing with this pain for several years. I advised pt of needing appt or could go to ED but he refuses to go to ED and having to wait long time. Pt states he was in pain yesterday during visit but downplayed pain but didn't realize pain was going to get this intense again. I advised pt that I will send message back and see if theres anything that can be done and have someone fu back in the morning. Pt verbalized understanding.

## 2022-06-06 NOTE — Telephone Encounter (Signed)
Pt states he can't come in at the moment for an appointment but he will call back to schedule something. I also let pt know that Jolene will be reaching out to him today via mychart for lab results

## 2022-06-06 NOTE — Telephone Encounter (Signed)
  Chief Complaint: abdominal pain- slightly worse today- was seen in office yesterday- requesting pain medication Symptoms: upper midline abdominal pain Frequency: chronic- ongoing Pertinent Negatives: Patient denies back pain, diarrhea, fever, urination pain, vomiting Disposition: '[]'$ ED /'[]'$ Urgent Care (no appt availability in office) / '[]'$ Appointment(In office/virtual)/ '[]'$  [facility]/ '[]'$ Home Care/ '[]'$ Refused Recommended Disposition /'[]'$ [facility]/ '[x]'$  Follow-up with PCP Additional Notes: Patient has several concerns after visit yesterday

## 2022-06-07 MED ORDER — HYDROCODONE-ACETAMINOPHEN 5-325 MG PO TABS: 1.0000 | ORAL_TABLET | Freq: Four times a day (QID) | ORAL | 0 refills | Status: DC | PRN

## 2022-06-07 MED ORDER — GABAPENTIN 600 MG PO TABS
300.0000 mg | ORAL_TABLET | Freq: Every day | ORAL | 0 refills | Status: DC
Start: 2022-06-07 — End: 2022-07-05

## 2022-06-07 NOTE — Telephone Encounter (Signed)
Sent patient a message via MyChart.

## 2022-06-07 NOTE — Addendum Note (Signed)
Addended by: Marnee Guarneri T on: 06/07/2022 02:11 PM   Modules accepted: Orders

## 2022-06-11 ENCOUNTER — Encounter: Payer: Self-pay | Admitting: Nurse Practitioner

## 2022-06-14 MED ORDER — HYDROCODONE-ACETAMINOPHEN 5-325 MG PO TABS: 1.0000 | ORAL_TABLET | Freq: Three times a day (TID) | ORAL | 0 refills | Status: AC | PRN

## 2022-06-14 NOTE — Addendum Note (Signed)
Addended by: Marnee Guarneri T on: 06/14/2022 06:31 PM   Modules accepted: Orders

## 2022-06-15 ENCOUNTER — Telehealth: Payer: Self-pay | Admitting: *Deleted

## 2022-06-15 DIAGNOSIS — Z961 Presence of intraocular lens: Secondary | ICD-10-CM | POA: Diagnosis not present

## 2022-06-15 MED ORDER — ROSUVASTATIN CALCIUM 40 MG PO TABS: 40.0000 mg | ORAL_TABLET | Freq: Every day | ORAL | 3 refills | Status: DC

## 2022-06-15 NOTE — Telephone Encounter (Signed)
Called and LVM letting patient know that RX has been sent in for him.

## 2022-06-15 NOTE — Addendum Note (Signed)
Addended by: Marnee Guarneri T on: 06/15/2022 02:31 PM   Modules accepted: Orders

## 2022-06-15 NOTE — Telephone Encounter (Signed)
Pt called due to getting a letter about lab results and if he is taking Simvastatin daily. Pt states he already sent a message stating he is taking it daily and has been waiting for the new med, Rosuvastatin, to be called in. I assured him I would make sure Jolene receives message that he will continue Simvastatin until he receives Rosuvastatin from pharm. Asking to have it called in today.

## 2022-06-20 ENCOUNTER — Encounter: Payer: Self-pay | Admitting: Internal Medicine

## 2022-06-22 DIAGNOSIS — M170 Bilateral primary osteoarthritis of knee: Secondary | ICD-10-CM | POA: Diagnosis not present

## 2022-06-24 ENCOUNTER — Encounter: Payer: Self-pay | Admitting: Internal Medicine

## 2022-06-25 MED ORDER — HYDROCODONE-ACETAMINOPHEN 5-325 MG PO TABS: 1.0000 | ORAL_TABLET | Freq: Three times a day (TID) | ORAL | 0 refills | Status: AC | PRN

## 2022-06-25 NOTE — Addendum Note (Signed)
Addended by: Venita Lick on: 06/25/2022 08:49 AM   Modules accepted: Orders

## 2022-07-02 ENCOUNTER — Other Ambulatory Visit: Payer: Self-pay | Admitting: Gastroenterology

## 2022-07-02 ENCOUNTER — Encounter: Payer: Self-pay | Admitting: Nurse Practitioner

## 2022-07-03 MED ORDER — HYDROCODONE-ACETAMINOPHEN 5-325 MG PO TABS: 1.0000 | ORAL_TABLET | Freq: Four times a day (QID) | ORAL | 0 refills | Status: AC | PRN

## 2022-07-05 ENCOUNTER — Encounter: Payer: Self-pay | Admitting: Internal Medicine

## 2022-07-05 ENCOUNTER — Ambulatory Visit (INDEPENDENT_AMBULATORY_CARE_PROVIDER_SITE_OTHER): Payer: Medicare Other | Admitting: Pulmonary Disease

## 2022-07-05 ENCOUNTER — Other Ambulatory Visit: Payer: Self-pay

## 2022-07-05 ENCOUNTER — Encounter: Payer: Self-pay | Admitting: Pulmonary Disease

## 2022-07-05 VITALS — BP 120/70 | HR 55 | Temp 97.8°F | Ht 68.0 in | Wt 211.0 lb

## 2022-07-05 DIAGNOSIS — F17200 Nicotine dependence, unspecified, uncomplicated: Secondary | ICD-10-CM

## 2022-07-05 DIAGNOSIS — J432 Centrilobular emphysema: Secondary | ICD-10-CM | POA: Diagnosis not present

## 2022-07-05 DIAGNOSIS — J4520 Mild intermittent asthma, uncomplicated: Secondary | ICD-10-CM | POA: Diagnosis not present

## 2022-07-05 DIAGNOSIS — E1165 Type 2 diabetes mellitus with hyperglycemia: Secondary | ICD-10-CM

## 2022-07-05 MED ORDER — ULTRACARE PEN NEEDLES 32G X 4 MM MISC: 1 refills | Status: DC

## 2022-07-05 NOTE — Progress Notes (Signed)
[facility]  Chief Complaint  Patient presents with   Follow-up    Constitutional:  BP 120/70 (BP Location: Left Arm, Patient Position: Sitting, Cuff Size: Large)   Pulse (!) 55   Temp 97.8 F (36.6 C) (Oral)   Ht '5\' 8"'$  (1.727 m)   Wt 211 lb (95.7 kg)   SpO2 94%   BMI 32.08 kg/m   Past Medical History:  ETOH, Allergies, Anxiety, Pancreatitis complicated by ARDS and PE in 2003, Bipolar, DM, Cataract, Depression, HLD, HTN, Schizoaffective  Past Surgical History:  He  has a past surgical history that includes Colonoscopy with propofol (N/A, 08/21/2017); Cataract extraction w/PHACO (Right, 03/26/2018); Cataract extraction w/PHACO (Left, 04/23/2018); Eye surgery; Umbilical hernia repair (N/A, 06/22/2019); Upper gi endoscopy; Hernia repair; Rib resection (N/A, 08/20/2019); Colonoscopy; Endoscopic retrograde cholangiopancreatography (ercp) with propofol (N/A, 08/31/2021); Upper esophageal endoscopic ultrasound (eus) (N/A, 08/31/2021); Fine needle aspiration (N/A, 08/31/2021); biopsy (08/31/2021); Sphincterotomy (08/31/2021); Esophagogastroduodenoscopy (egd) with propofol (N/A, 08/31/2021); Colonoscopy with propofol (N/A, 12/15/2021); polypectomy (N/A, 12/15/2021); and Stent removal.  Brief Summary:  Shaun Odonnell is a 56 y.o. male smoker with cough.       Subjective:   He stopped smoking tobacco cigarettes 3 months ago.  Breathing much better.  Not having cough, wheeze, sputum, or chest congestion.  Hasn't needed to use his inhalers.  He is still vaping, but not adding any juice.  He plans to gradually decrease how much of this he is using.  Chest xray from 05/22/22 showed stable Lt base scarring.  Physical Exam:   Appearance - well kempt   ENMT - no sinus tenderness, no oral exudate, no LAN, Mallampati 3 airway, no stridor  Respiratory - equal breath sounds bilaterally, no wheezing or rales  CV - s1s2 regular rate and rhythm, no  murmurs  Ext - no clubbing, no edema  Skin - no rashes  Psych - normal mood and affect    Pulmonary testing:  PFT 06/08/21 >> 2.58 (71%), FEV1% 78, TLC 4.82 (73%), DLCO 73%, +BD  Chest Imaging:  CT angio chest 09/25/16 >> apical emphysema, fatty liver  Cardiac Tests:  Echo 01/04/15 >> EF 55%, mild MR  Social History:  He  reports that he quit smoking about 3 months ago. His smoking use included cigarettes. He has a 17.00 pack-year smoking history. He quit smokeless tobacco use about 33 years ago.  His smokeless tobacco use included snuff. He reports current alcohol use of about 6.0 standard drinks of alcohol per week. He reports that he does not currently use drugs.  Family History:  His family history includes Dementia in his maternal grandmother; Diabetes in his maternal aunt and paternal grandfather; Heart disease in his maternal grandfather and paternal grandmother; Hyperlipidemia in his mother; Hypertension in his father; Stroke in his father and paternal grandfather.     Assessment/Plan:   Chronic cough. - from asthma and emphysema - much improved since he stopped smoking tobacco cigarettes - spiriva was ineffective and breo made him feel anxious - he can use symbicort  or albuterol prn  Vaping non tobacco product. - he has used this to successfully quit smoking tobacco cigarettes - his plan is to gradually quit vaping  Chronic alcoholic pancreatitis. - followed by Dr. Justice Britain with [facility]  Time Spent Involved in Patient Care on Day of Examination:  26 minutes  Follow up:   Patient Instructions  Follow up in 1 year  Medication List:   Allergies  as of 07/05/2022       Reactions   Fentanyl Nausea And Vomiting   Morphine And Related Shortness Of Breath   Clonazepam Other (See Comments)   Other reaction(s): "Dystonic"; alprazolam is a home med Dystonic Reaction   Pimozide Other (See Comments)   Other  reaction(s): "Distonic" Dystonic Reaction   Trifluoperazine Other (See Comments)   Other (See Comments) "Distonic" Dystonic Reaction   Azithromycin Other (See Comments)   Was told not to take Z pak due to his heart   Montelukast    headache, nausea, feeling a little anxious, panic   Morphine Other (See Comments)   Other reaction(s): " I stop breathing"; can take Diluadid Cnnot tolerate d/t Drug Metabolism   Tramadol Nausea Only        Medication List        Accurate as of July 05, 2022  9:32 AM. If you have any questions, ask your nurse or doctor.          STOP taking these medications    gabapentin 600 MG tablet Commonly known as: NEURONTIN Stopped by: Chesley Mires, MD       TAKE these medications    acetaminophen 500 MG tablet Commonly known as: TYLENOL Take 1 tablet (500 mg total) by mouth every 6 (six) hours as needed.   albuterol 108 (90 Base) MCG/ACT inhaler Commonly known as: VENTOLIN HFA INHALE 2 PUFFS BY MOUTH EVERY 6 HOURS ASNEEDED WHEEZING/ SHORTNESS OF BREATH   albuterol (2.5 MG/3ML) 0.083% nebulizer solution Commonly known as: PROVENTIL Take 3 mLs (2.5 mg total) by nebulization every 6 (six) hours as needed for wheezing or shortness of breath.   ALPRAZolam 0.5 MG tablet Commonly known as: XANAX Take 0.5 mg by mouth 4 (four) times daily. Patient takes when wakes up(~0600)/ 1000/ 1600/ 2000   budesonide-formoterol 80-4.5 MCG/ACT inhaler Commonly known as: Symbicort Inhale 2 puffs into the lungs in the morning and at bedtime.   carboxymethylcellulose 0.5 % Soln Commonly known as: REFRESH PLUS Place 2 drops into both eyes daily as needed (Dry eyes).   celecoxib 200 MG capsule Commonly known as: CELEBREX Take 200 mg by mouth daily.   Dexcom G6 Sensor Misc 1 Device by Does not apply route See admin instructions. Change every 10 days   Dexcom G6 Transmitter Misc 1 Device by Other route as directed. Every 10 days   diclofenac Sodium 1 %  Gel Commonly known as: Voltaren Apply 2 g topically 4 (four) times daily.   empagliflozin 25 MG Tabs tablet Commonly known as: Jardiance Take 1 tablet (25 mg total) by mouth daily.   Eszopiclone 3 MG Tabs Take 3 mg by mouth at bedtime as needed.   fenofibrate 145 MG tablet Commonly known as: TRICOR Take 1 tablet (145 mg total) by mouth daily.   glucose blood test strip Use as instructed   GNP Insulin Syringes 30G X 5/16" 1 ML Misc Generic drug: Insulin Syringe-Needle U-100 USE AS DIRECTED. WITH BOTH INSULINS 5 TIMES A DAY   HYDROcodone-acetaminophen 5-325 MG tablet Commonly known as: Norco Take 1 tablet by mouth every 6 (six) hours as needed for up to 5 days for moderate pain.   ibuprofen 400 MG tablet Commonly known as: ADVIL Take 1 tablet (400 mg total) by mouth every 6 (six) hours as needed.   Ketone Test Strp 1 each by In Vitro route daily in the afternoon.   NovoLIN R FlexPen ReliOn 100 UNIT/ML KwikPen Generic drug: Insulin Regular Human Max  daily 50 units per scale What changed: additional instructions   OLANZapine 20 MG tablet Commonly known as: ZYPREXA Take 20 mg by mouth at bedtime.   omeprazole 40 MG capsule Commonly known as: PRILOSEC TAKE 1 CAPSULE BY MOUTH ONCE DAILY   ondansetron 8 MG disintegrating tablet Commonly known as: ZOFRAN-ODT Take 1 tablet (8 mg total) by mouth every 8 (eight) hours as needed for nausea or vomiting.   OneTouch Delica Lancets 85F Misc USE TO CHECK FASTING SUGAR TWICE DAILY   rosuvastatin 40 MG tablet Commonly known as: CRESTOR Take 1 tablet (40 mg total) by mouth daily.   sertraline 100 MG tablet Commonly known as: ZOLOFT Take 100 mg by mouth daily.   Toujeo Max SoloStar 300 UNIT/ML Solostar Pen Generic drug: insulin glargine (2 Unit Dial) Inject 74 Units into the skin daily in the afternoon. What changed:  how much to take when to take this   Ultracare Pen Needles 32G X 4 MM Misc Generic drug: Insulin Pen  Needle Use to inject insulin as needed What changed: additional instructions Changed by: Jefferson Fuel, CMA        Signature:  Chesley Mires, MD Media Pager - [phone] 07/05/2022, 9:32 AM

## 2022-07-05 NOTE — Patient Instructions (Signed)
Follow up in 1 year.

## 2022-07-10 NOTE — Progress Notes (Deleted)
New patient visit   Patient: Shaun Odonnell   DOB: 1965-12-10   56 y.o. Male  MRN: [id number] Visit Date: 07/12/2022  Today's healthcare provider: Gwyneth Sprout, FNP   No chief complaint on file.  Subjective    Shaun Odonnell is a 56 y.o. male who presents today as a new patient to establish care.  HPI  ***  Past Medical History:  Diagnosis Date   Alcohol abuse 04/29/2019   Allergy    Anxiety    ARDS (adult respiratory distress syndrome) (HCC)    Asthma    Bipolar disorder (HCC)    Cataract    Chronic kidney disease    per pt - no current problems   COPD (chronic obstructive pulmonary disease) (HCC)    chronic cough and wheezing   Depression    Diabetes mellitus without complication ([place])    type 2   Dizziness    medication related   Dyspnea    with exertion   Elevated lipids    Fatty liver    Hypercholesterolemia    Hypertension    per pt, no current prob-no meds   Pancreatitis    Pneumonia    in past   Pulmonary embolism ([place])    Schizoaffective disorder ([place])    Past Surgical History:  Procedure Laterality Date   BIOPSY  08/31/2021   Procedure: BIOPSY;  Surgeon: Irving Copas., MD;  Location: [place];  Service: Gastroenterology;;   CATARACT EXTRACTION W/PHACO Right 03/26/2018   Procedure: CATARACT EXTRACTION PHACO AND INTRAOCULAR LENS PLACEMENT ([place]) RIGHT BORDERLINE DIABETIC;  Surgeon: Leandrew Koyanagi, MD;  Location: [place];  Service: Ophthalmology;  Laterality: Right;   CATARACT EXTRACTION W/PHACO Left 04/23/2018   Procedure: CATARACT EXTRACTION PHACO AND INTRAOCULAR LENS PLACEMENT ([place])  LEFT BORDERLINE DIABETIC;  Surgeon: Leandrew Koyanagi, MD;  Location: [facility];  Service: Ophthalmology;  Laterality: Left;   COLONOSCOPY     COLONOSCOPY WITH PROPOFOL N/A 08/21/2017   Procedure: COLONOSCOPY WITH PROPOFOL;  Surgeon: Manya Silvas, MD;  [facility] ENDOSCOPY;  Service: Endoscopy;  Laterality:  N/A;   COLONOSCOPY WITH PROPOFOL N/A 12/15/2021   Procedure: COLONOSCOPY WITH BIOPSY;  Surgeon: [name], MD;  Location: [place];  Service: Endoscopy;  Laterality: N/A;  Diabetic   ENDOSCOPIC RETROGRADE CHOLANGIOPANCREATOGRAPHY (ERCP) WITH PROPOFOL N/A 08/31/2021   Procedure: ENDOSCOPIC RETROGRADE CHOLANGIOPANCREATOGRAPHY (ERCP) WITH PROPOFOL;  Surgeon: Rush Landmark Telford Nab., MD;  Location: [place];  Service: Gastroenterology;  Laterality: N/A;   ESOPHAGOGASTRODUODENOSCOPY (EGD) WITH PROPOFOL N/A 08/31/2021   Procedure: ESOPHAGOGASTRODUODENOSCOPY (EGD) WITH PROPOFOL;  Surgeon: Rush Landmark Telford Nab., MD;  Location: [place];  Service: Gastroenterology;  Laterality: N/A;   EYE SURGERY     FINE NEEDLE ASPIRATION N/A 08/31/2021   Procedure: FINE NEEDLE ASPIRATION (FNA) LINEAR;  Surgeon: Irving Copas., MD;  Location: [place];  Service: Gastroenterology;  Laterality: N/A;   HERNIA REPAIR     inguinal and umbilical   POLYPECTOMY N/A 12/15/2021   Procedure: POLYPECTOMY;  Surgeon: [name], MD;  Location: [place];  Service: Endoscopy;  Laterality: N/A;   RIB RESECTION N/A 08/20/2019   Procedure: removal of xyphoid process;  Surgeon: Nestor Lewandowsky, MD;  Location: [facility];  Service: Thoracic;  Laterality: N/A;   SPHINCTEROTOMY  08/31/2021   Procedure: Joan Mayans;  Surgeon: Mansouraty, Telford Nab., MD;  Location: [place];  Service: Gastroenterology;;   STENT REMOVAL     UMBILICAL HERNIA REPAIR N/A 06/22/2019   Procedure: OPEN  HERNIA REPAIR UMBILICAL ADULT;  Surgeon: Olean Ree, MD;  Location: [facility];  Service: General;  Laterality: N/A;   UPPER ESOPHAGEAL ENDOSCOPIC ULTRASOUND (EUS) N/A 08/31/2021   Procedure: UPPER ESOPHAGEAL ENDOSCOPIC ULTRASOUND (EUS);  Surgeon: Irving Copas., MD;  Location: [place];  Service: Gastroenterology;  Laterality: N/A;   UPPER GI ENDOSCOPY     Family Status  Relation Name Status    Mother  Alive   Father  Alive   Mat 64  Alive   MGM  (Not Specified)   MGF  (Not Specified)   PGM  (Not Specified)   PGF  Deceased   Neg Hx  (Not Specified)   Family History  Problem Relation Age of Onset   Hyperlipidemia Mother    Hypertension Father    Stroke Father    Diabetes Maternal Aunt    Dementia Maternal Grandmother    Heart disease Maternal Grandfather    Heart disease Paternal Grandmother    Stroke Paternal Grandfather    Diabetes Paternal Grandfather    Colon cancer Neg Hx    Esophageal cancer Neg Hx    Inflammatory bowel disease Neg Hx    Liver disease Neg Hx    Pancreatic cancer Neg Hx    Stomach cancer Neg Hx    Rectal cancer Neg Hx    Social History   Socioeconomic History   Marital status: Single    Spouse name: Not on file   Number of children: 0   Years of education: Not on file   Highest education level: Bachelor's degree (e.g., BA, AB, BS)  Occupational History   Occupation: disabled  Tobacco Use   Smoking status: Former    Packs/day: 0.50    Years: 34.00    Total pack years: 17.00    Types: Cigarettes    Quit date: 04/04/2022    Years since quitting: 0.2   Smokeless tobacco: Former    Types: Snuff    Quit date: 1990   Tobacco comments:    Still vaping daily.  Stopped smoking.  07/05/2022  Vaping Use   Vaping Use: Every day   Substances: Nicotine, Flavoring  Substance and Sexual Activity   Alcohol use: Yes    Alcohol/week: 6.0 standard drinks of alcohol    Types: 6 Cans of beer per week    Comment: 3 beers 2x a week   Drug use: Not Currently   Sexual activity: Not Currently  Other Topics Concern   Not on file  Social History Narrative   Not on file   Social Determinants of Health   Financial Resource Strain: Low Risk  (11/16/2021)   Overall Financial Resource Strain (CARDIA)    Difficulty of Paying Living Expenses: Not hard at all  Food Insecurity: No Food Insecurity (11/16/2021)   Hunger Vital Sign    Worried About Running  Out of Food in the Last Year: Never true    Ran Out of Food in the Last Year: Never true  Transportation Needs: No Transportation Needs (11/16/2021)   PRAPARE - Hydrologist (Medical): No    Lack of Transportation (Non-Medical): No  Physical Activity: Inactive (11/16/2021)   Exercise Vital Sign    Days of Exercise per Week: 0 days    Minutes of Exercise per Session: 0 min  Stress: No Stress Concern Present (11/16/2021)   [place]    Feeling of Stress : Not at all  Social Connections:  Moderately Isolated (11/16/2021)   Social Connection and Isolation Panel [NHANES]    Frequency of Communication with Friends and Family: More than three times a week    Frequency of Social Gatherings with Friends and Family: Three times a week    Attends Religious Services: More than 4 times per year    Active Member of Clubs or Organizations: No    Attends Archivist Meetings: Never    Marital Status: Never married   Outpatient Medications Prior to Visit  Medication Sig   acetaminophen (TYLENOL) 500 MG tablet Take 1 tablet (500 mg total) by mouth every 6 (six) hours as needed.   Acetone, Urine, Test (KETONE TEST) STRP 1 each by In Vitro route daily in the afternoon.   albuterol (PROVENTIL) (2.5 MG/3ML) 0.083% nebulizer solution Take 3 mLs (2.5 mg total) by nebulization every 6 (six) hours as needed for wheezing or shortness of breath. (Patient not taking: Reported on 07/05/2022)   albuterol (VENTOLIN HFA) 108 (90 Base) MCG/ACT inhaler INHALE 2 PUFFS BY MOUTH EVERY 6 HOURS ASNEEDED WHEEZING/ SHORTNESS OF BREATH (Patient not taking: Reported on 07/05/2022)   ALPRAZolam (XANAX) 0.5 MG tablet Take 0.5 mg by mouth 4 (four) times daily. Patient takes when wakes up(~0600)/ 1000/ 1600/ 2000   budesonide-formoterol (SYMBICORT) 80-4.5 MCG/ACT inhaler Inhale 2 puffs into the lungs in the morning and at bedtime. (Patient  not taking: Reported on 07/05/2022)   carboxymethylcellulose (REFRESH PLUS) 0.5 % SOLN Place 2 drops into both eyes daily as needed (Dry eyes).   celecoxib (CELEBREX) 200 MG capsule Take 200 mg by mouth daily.   Continuous Blood Gluc Sensor (DEXCOM G6 SENSOR) MISC 1 Device by Does not apply route See admin instructions. Change every 10 days   Continuous Blood Gluc Transmit (DEXCOM G6 TRANSMITTER) MISC 1 Device by Other route as directed. Every 10 days   diclofenac Sodium (VOLTAREN) 1 % GEL Apply 2 g topically 4 (four) times daily.   empagliflozin (JARDIANCE) 25 MG TABS tablet Take 1 tablet (25 mg total) by mouth daily.   Eszopiclone 3 MG TABS Take 3 mg by mouth at bedtime as needed.   fenofibrate (TRICOR) 145 MG tablet Take 1 tablet (145 mg total) by mouth daily.   glucose blood test strip Use as instructed   GNP INSULIN SYRINGES 30G X 5/16" 1 ML MISC USE AS DIRECTED. WITH BOTH INSULINS 5 TIMES A DAY   ibuprofen (ADVIL) 400 MG tablet Take 1 tablet (400 mg total) by mouth every 6 (six) hours as needed.   insulin glargine, 2 Unit Dial, (TOUJEO MAX SOLOSTAR) 300 UNIT/ML Solostar Pen Inject 74 Units into the skin daily in the afternoon. (Patient taking differently: Inject 60 Units into the skin every morning.)   Insulin Pen Needle (ULTRACARE PEN NEEDLES) 32G X 4 MM MISC Use to inject insulin as needed   Insulin Regular Human (NOVOLIN R FLEXPEN RELION) 100 UNIT/ML KwikPen Max daily 50 units per scale (Patient taking differently: Max daily 50 units per scale 10 units ac meals and addition on sliding scale.)   OLANZapine (ZYPREXA) 20 MG tablet Take 20 mg by mouth at bedtime.   omeprazole (PRILOSEC) 40 MG capsule TAKE 1 CAPSULE BY MOUTH ONCE DAILY   ondansetron (ZOFRAN-ODT) 8 MG disintegrating tablet Take 1 tablet (8 mg total) by mouth every 8 (eight) hours as needed for nausea or vomiting.   OneTouch Delica Lancets 40N MISC USE TO CHECK FASTING SUGAR TWICE DAILY   rosuvastatin (CRESTOR) 40 MG tablet Take  1 tablet (40 mg total) by mouth daily.   sertraline (ZOLOFT) 100 MG tablet Take 100 mg by mouth daily.    No facility-administered medications prior to visit.   Allergies  Allergen Reactions   Fentanyl Nausea And Vomiting   Morphine And Related Shortness Of Breath   Clonazepam Other (See Comments)    Other reaction(s): "Dystonic"; alprazolam is a home med Dystonic Reaction   Pimozide Other (See Comments)    Other reaction(s): "Distonic" Dystonic Reaction   Trifluoperazine Other (See Comments)    Other (See Comments) "Distonic" Dystonic Reaction   Azithromycin Other (See Comments)    Was told not to take Z pak due to his heart   Montelukast     headache, nausea, feeling a little anxious, panic   Morphine Other (See Comments)    Other reaction(s): " I stop breathing"; can take Diluadid Cnnot tolerate d/t Drug Metabolism   Tramadol Nausea Only    Immunization History  Administered Date(s) Administered   Dtap, Unspecified 10/24/2011   Influenza Split 09/26/2015   Influenza,inj,Quad PF,6+ Mos 08/06/2019, 09/12/2020   Influenza-Unspecified 10/16/2010, 08/09/2021   PFIZER(Purple Top)SARS-COV-2 Vaccination 01/23/2020, 02/16/2020, 11/07/2020   Pneumococcal Polysaccharide-23 10/24/2012   Tdap 09/12/2020    Health Maintenance  Topic Date Due   Zoster Vaccines- Shingrix (1 of 2) Never done   COVID-19 Vaccine (4 - Pfizer risk series) 01/02/2021   OPHTHALMOLOGY EXAM  06/03/2021   INFLUENZA VACCINE  02/03/2023 (Originally 06/05/2022)   HEMOGLOBIN A1C  12/01/2022   FOOT EXAM  06/06/2023   URINE MICROALBUMIN  06/06/2023   COLONOSCOPY (Pts 45-52yr Insurance coverage will need to be confirmed)  12/15/2026   TETANUS/TDAP  09/12/2030   HIV Screening  Completed   HPV VACCINES  Aged Out    Patient Care Team: Practice, [facility] as PCP - General BLeandrew Koyanagi MD as Referring Physician (Ophthalmology)  Review of Systems  {Labs  Heme  Chem  Endocrine  Serology   Results Review (optional):23779}   Objective    There were no vitals taken for this visit. {Show previous vital signs (optional):23777}  Physical Exam ***  Depression Screen    06/05/2022   10:51 AM 03/13/2022   10:49 AM 01/10/2022    9:09 AM 12/11/2021    3:45 PM  PHQ 2/9 Scores  PHQ - 2 Score '2 2 2 1  '$ PHQ- 9 Score '3 4 4 4   '$ No results found for any visits on 07/12/22.  Assessment & Plan     ***  No follow-ups on file.     {provider attestation***:1}   EGwyneth Sprout [phone](phone) [phone](fax)  [place]

## 2022-07-12 ENCOUNTER — Ambulatory Visit: Payer: Medicare Other | Admitting: Family Medicine

## 2022-07-18 ENCOUNTER — Encounter: Payer: Self-pay | Admitting: Internal Medicine

## 2022-07-19 ENCOUNTER — Other Ambulatory Visit: Payer: Self-pay

## 2022-07-19 ENCOUNTER — Inpatient Hospital Stay
Admission: EM | Admit: 2022-07-19 | Discharge: 2022-07-21 | DRG: 439 | Disposition: A | Discharge status: Home / Self Care | Payer: Medicare Other | Attending: Internal Medicine | Admitting: Internal Medicine

## 2022-07-19 ENCOUNTER — Emergency Department: Payer: Medicare Other

## 2022-07-19 DIAGNOSIS — I10 Essential (primary) hypertension: Secondary | ICD-10-CM | POA: Diagnosis not present

## 2022-07-19 DIAGNOSIS — E11649 Type 2 diabetes mellitus with hypoglycemia without coma: Secondary | ICD-10-CM | POA: Diagnosis not present

## 2022-07-19 DIAGNOSIS — E785 Hyperlipidemia, unspecified: Secondary | ICD-10-CM | POA: Diagnosis not present

## 2022-07-19 DIAGNOSIS — J449 Chronic obstructive pulmonary disease, unspecified: Secondary | ICD-10-CM | POA: Diagnosis not present

## 2022-07-19 DIAGNOSIS — E1165 Type 2 diabetes mellitus with hyperglycemia: Secondary | ICD-10-CM | POA: Diagnosis present

## 2022-07-19 DIAGNOSIS — Z833 Family history of diabetes mellitus: Secondary | ICD-10-CM | POA: Diagnosis not present

## 2022-07-19 DIAGNOSIS — K861 Other chronic pancreatitis: Secondary | ICD-10-CM | POA: Diagnosis present

## 2022-07-19 DIAGNOSIS — Z79899 Other long term (current) drug therapy: Secondary | ICD-10-CM | POA: Diagnosis not present

## 2022-07-19 DIAGNOSIS — K859 Acute pancreatitis without necrosis or infection, unspecified: Principal | ICD-10-CM | POA: Diagnosis present

## 2022-07-19 DIAGNOSIS — F319 Bipolar disorder, unspecified: Secondary | ICD-10-CM | POA: Diagnosis present

## 2022-07-19 DIAGNOSIS — Z794 Long term (current) use of insulin: Secondary | ICD-10-CM | POA: Diagnosis not present

## 2022-07-19 DIAGNOSIS — N200 Calculus of kidney: Secondary | ICD-10-CM | POA: Diagnosis not present

## 2022-07-19 DIAGNOSIS — D696 Thrombocytopenia, unspecified: Secondary | ICD-10-CM | POA: Diagnosis not present

## 2022-07-19 DIAGNOSIS — E119 Type 2 diabetes mellitus without complications: Secondary | ICD-10-CM

## 2022-07-19 DIAGNOSIS — R1013 Epigastric pain: Secondary | ICD-10-CM | POA: Diagnosis not present

## 2022-07-19 DIAGNOSIS — Z885 Allergy status to narcotic agent status: Secondary | ICD-10-CM | POA: Diagnosis not present

## 2022-07-19 DIAGNOSIS — R110 Nausea: Secondary | ICD-10-CM

## 2022-07-19 DIAGNOSIS — Z8349 Family history of other endocrine, nutritional and metabolic diseases: Secondary | ICD-10-CM

## 2022-07-19 DIAGNOSIS — Z888 Allergy status to other drugs, medicaments and biological substances status: Secondary | ICD-10-CM | POA: Diagnosis not present

## 2022-07-19 DIAGNOSIS — R1110 Vomiting, unspecified: Secondary | ICD-10-CM | POA: Diagnosis not present

## 2022-07-19 DIAGNOSIS — Z823 Family history of stroke: Secondary | ICD-10-CM

## 2022-07-19 DIAGNOSIS — K863 Pseudocyst of pancreas: Secondary | ICD-10-CM

## 2022-07-19 DIAGNOSIS — Z8249 Family history of ischemic heart disease and other diseases of the circulatory system: Secondary | ICD-10-CM | POA: Diagnosis not present

## 2022-07-19 DIAGNOSIS — F32A Depression, unspecified: Secondary | ICD-10-CM

## 2022-07-19 DIAGNOSIS — E781 Pure hyperglyceridemia: Secondary | ICD-10-CM | POA: Diagnosis present

## 2022-07-19 DIAGNOSIS — F17290 Nicotine dependence, other tobacco product, uncomplicated: Secondary | ICD-10-CM | POA: Diagnosis not present

## 2022-07-19 DIAGNOSIS — F419 Anxiety disorder, unspecified: Secondary | ICD-10-CM | POA: Diagnosis present

## 2022-07-19 DIAGNOSIS — Z7984 Long term (current) use of oral hypoglycemic drugs: Secondary | ICD-10-CM | POA: Diagnosis not present

## 2022-07-19 DIAGNOSIS — R109 Unspecified abdominal pain: Secondary | ICD-10-CM | POA: Diagnosis not present

## 2022-07-19 LAB — GLUCOSE, CAPILLARY
Glucose-Capillary: 184 mg/dL — ABNORMAL HIGH (ref 70–99)
Glucose-Capillary: 66 mg/dL — ABNORMAL LOW (ref 70–99)
Glucose-Capillary: 110 mg/dL — ABNORMAL HIGH (ref 70–99)
Glucose-Capillary: 153 mg/dL — ABNORMAL HIGH (ref 70–99)
Glucose-Capillary: 63 mg/dL — ABNORMAL LOW (ref 70–99)
Glucose-Capillary: 67 mg/dL — ABNORMAL LOW (ref 70–99)
Glucose-Capillary: 119 mg/dL — ABNORMAL HIGH (ref 70–99)

## 2022-07-19 LAB — URINALYSIS, ROUTINE W REFLEX MICROSCOPIC
Bacteria, UA: NONE SEEN
Bilirubin Urine: NEGATIVE
Glucose, UA: >=500 mg/dL — AB
Hgb urine dipstick: NEGATIVE
Ketones, ur: 5 mg/dL — AB
Leukocytes,Ua: NEGATIVE
Nitrite: NEGATIVE
Protein, ur: NEGATIVE mg/dL
Specific Gravity, Urine: 1.033 — ABNORMAL HIGH (ref 1.005–1.030)
Squamous Epithelial / HPF: NONE SEEN (ref 0–5)
WBC, UA: NONE SEEN WBC/hpf (ref 0–5)
pH: 6 (ref 5.0–8.0)

## 2022-07-19 LAB — CBC
HCT: 44.7 % (ref 39.0–52.0)
HCT: 41.1 % (ref 39.0–52.0)
Hemoglobin: 15.1 g/dL (ref 13.0–17.0)
Hemoglobin: 13.5 g/dL (ref 13.0–17.0)
MCH: 30.3 pg (ref 26.0–34.0)
MCH: 29.9 pg (ref 26.0–34.0)
MCHC: 33.8 g/dL (ref 30.0–36.0)
MCHC: 32.8 g/dL (ref 30.0–36.0)
MCV: 89.8 fL (ref 80.0–100.0)
MCV: 90.9 fL (ref 80.0–100.0)
Platelets: 138 10*3/uL — ABNORMAL LOW (ref 150–400)
Platelets: 107 10*3/uL — ABNORMAL LOW (ref 150–400)
RBC: 4.98 MIL/uL (ref 4.22–5.81)
RBC: 4.52 MIL/uL (ref 4.22–5.81)
RDW: 13.2 % (ref 11.5–15.5)
RDW: 13.2 % (ref 11.5–15.5)
WBC: 7 10*3/uL (ref 4.0–10.5)
WBC: 5.5 10*3/uL (ref 4.0–10.5)
nRBC: 0 % (ref 0.0–0.2)
nRBC: 0 % (ref 0.0–0.2)

## 2022-07-19 LAB — COMPREHENSIVE METABOLIC PANEL
ALT: 19 U/L (ref 0–44)
AST: 22 U/L (ref 15–41)
Albumin: 4.3 g/dL (ref 3.5–5.0)
Alkaline Phosphatase: 74 U/L (ref 38–126)
Anion gap: 13 (ref 5–15)
BUN: 19 mg/dL (ref 6–20)
CO2: 21 mmol/L — ABNORMAL LOW (ref 22–32)
Calcium: 9.1 mg/dL (ref 8.9–10.3)
Chloride: 98 mmol/L (ref 98–111)
Creatinine, Ser: 0.82 mg/dL (ref 0.61–1.24)
GFR, Estimated: >60 mL/min (ref >60)
Glucose, Bld: 281 mg/dL — ABNORMAL HIGH (ref 70–99)
Potassium: 4 mmol/L (ref 3.5–5.1)
Sodium: 132 mmol/L — ABNORMAL LOW (ref 135–145)
Total Bilirubin: 0.7 mg/dL (ref 0.3–1.2)
Total Protein: 7.9 g/dL (ref 6.5–8.1)

## 2022-07-19 LAB — CREATININE, SERUM
Creatinine, Ser: 0.58 mg/dL — ABNORMAL LOW (ref 0.61–1.24)
GFR, Estimated: >60 mL/min (ref >60)

## 2022-07-19 LAB — HIV ANTIBODY (ROUTINE TESTING W REFLEX): HIV Screen 4th Generation wRfx: NONREACTIVE

## 2022-07-19 LAB — LIPASE, BLOOD: Lipase: 662 U/L — ABNORMAL HIGH (ref 11–51)

## 2022-07-19 MED ORDER — FENOFIBRATE 160 MG PO TABS
160.0000 mg | ORAL_TABLET | Freq: Every day | ORAL | Status: DC
  Administered 2022-07-20 – 2022-07-21 (×2): 160 mg via ORAL
  Filled 2022-07-19 (×2): qty 1

## 2022-07-19 MED ORDER — HYDROMORPHONE HCL 1 MG/ML IJ SOLN
1.0000 mg | INTRAMUSCULAR | Status: DC | PRN
  Administered 2022-07-19 – 2022-07-21 (×11): 1 mg via INTRAVENOUS
  Filled 2022-07-19 (×12): qty 1

## 2022-07-19 MED ORDER — SERTRALINE HCL 50 MG PO TABS
100.0000 mg | ORAL_TABLET | Freq: Every day | ORAL | Status: DC
  Administered 2022-07-20 – 2022-07-21 (×2): 100 mg via ORAL
  Filled 2022-07-19 (×2): qty 2

## 2022-07-19 MED ORDER — ZOLPIDEM TARTRATE 5 MG PO TABS: 5.0000 mg | ORAL_TABLET | Freq: Every evening | ORAL | Status: DC | PRN

## 2022-07-19 MED ORDER — SODIUM CHLORIDE 0.9 % IV BOLUS
1000.0000 mL | Freq: Once | INTRAVENOUS | Status: AC
  Administered 2022-07-19: 1000 mL via INTRAVENOUS

## 2022-07-19 MED ORDER — DEXTROSE 50 % IV SOLN
12.5000 g | INTRAVENOUS | Status: AC
  Administered 2022-07-19: 12.5 g via INTRAVENOUS
  Filled 2022-07-19: qty 50

## 2022-07-19 MED ORDER — INSULIN ASPART 100 UNIT/ML IJ SOLN: 0.0000 [IU] | Freq: Three times a day (TID) | INTRAMUSCULAR | Status: DC

## 2022-07-19 MED ORDER — HYDROMORPHONE HCL 1 MG/ML IJ SOLN
1.0000 mg | Freq: Once | INTRAMUSCULAR | Status: AC
  Administered 2022-07-19: 1 mg via INTRAVENOUS
  Filled 2022-07-19: qty 1

## 2022-07-19 MED ORDER — ONDANSETRON 8 MG PO TBDP: 8.0000 mg | ORAL_TABLET | Freq: Three times a day (TID) | ORAL | Status: DC | PRN

## 2022-07-19 MED ORDER — MAGNESIUM HYDROXIDE 400 MG/5ML PO SUSP: 30.0000 mL | Freq: Every day | ORAL | Status: DC | PRN

## 2022-07-19 MED ORDER — ROSUVASTATIN CALCIUM 10 MG PO TABS
40.0000 mg | ORAL_TABLET | Freq: Every day | ORAL | Status: DC
  Administered 2022-07-19 – 2022-07-20 (×2): 40 mg via ORAL
  Filled 2022-07-19 (×3): qty 4

## 2022-07-19 MED ORDER — ONDANSETRON HCL 4 MG/2ML IJ SOLN
4.0000 mg | Freq: Four times a day (QID) | INTRAMUSCULAR | Status: DC | PRN
  Administered 2022-07-19: 4 mg via INTRAVENOUS
  Filled 2022-07-19: qty 2

## 2022-07-19 MED ORDER — ONDANSETRON HCL 4 MG/2ML IJ SOLN
4.0000 mg | Freq: Once | INTRAMUSCULAR | Status: AC
  Administered 2022-07-19: 4 mg via INTRAVENOUS
  Filled 2022-07-19: qty 2

## 2022-07-19 MED ORDER — PANTOPRAZOLE SODIUM 40 MG PO TBEC
40.0000 mg | DELAYED_RELEASE_TABLET | Freq: Every day | ORAL | Status: DC
  Administered 2022-07-20 – 2022-07-21 (×2): 40 mg via ORAL
  Filled 2022-07-19 (×2): qty 1

## 2022-07-19 MED ORDER — OLANZAPINE 5 MG PO TABS
20.0000 mg | ORAL_TABLET | Freq: Every day | ORAL | Status: DC
  Administered 2022-07-19 – 2022-07-20 (×2): 20 mg via ORAL
  Filled 2022-07-19 (×2): qty 4

## 2022-07-19 MED ORDER — KETOROLAC TROMETHAMINE 15 MG/ML IJ SOLN
15.0000 mg | Freq: Four times a day (QID) | INTRAMUSCULAR | Status: DC | PRN
  Administered 2022-07-19 – 2022-07-20 (×2): 15 mg via INTRAVENOUS
  Filled 2022-07-19 (×2): qty 1

## 2022-07-19 MED ORDER — INSULIN GLARGINE-YFGN 100 UNIT/ML ~~LOC~~ SOLN: 60.0000 [IU] | Freq: Every morning | SUBCUTANEOUS | Status: DC

## 2022-07-19 MED ORDER — TRAZODONE HCL 50 MG PO TABS: 25.0000 mg | ORAL_TABLET | Freq: Every evening | ORAL | Status: DC | PRN

## 2022-07-19 MED ORDER — LACTATED RINGERS IV BOLUS
1000.0000 mL | Freq: Once | INTRAVENOUS | Status: AC
  Administered 2022-07-19: 1000 mL via INTRAVENOUS

## 2022-07-19 MED ORDER — ACETAMINOPHEN 325 MG PO TABS: 650.0000 mg | ORAL_TABLET | Freq: Four times a day (QID) | ORAL | Status: DC | PRN

## 2022-07-19 MED ORDER — ACETAMINOPHEN 650 MG RE SUPP: 650.0000 mg | Freq: Four times a day (QID) | RECTAL | Status: DC | PRN

## 2022-07-19 MED ORDER — HYDROMORPHONE HCL 1 MG/ML IJ SOLN: 1.0000 mg | INTRAMUSCULAR | Status: DC | PRN

## 2022-07-19 MED ORDER — DEXTROSE 50 % IV SOLN
1.0000 | Freq: Once | INTRAVENOUS | Status: AC
  Administered 2022-07-19: 50 mL via INTRAVENOUS
  Filled 2022-07-19: qty 50

## 2022-07-19 MED ORDER — ENOXAPARIN SODIUM 40 MG/0.4ML IJ SOSY
40.0000 mg | PREFILLED_SYRINGE | INTRAMUSCULAR | Status: DC
  Administered 2022-07-19 – 2022-07-20 (×2): 40 mg via SUBCUTANEOUS
  Filled 2022-07-19 (×2): qty 0.4

## 2022-07-19 MED ORDER — IOHEXOL 300 MG/ML  SOLN
100.0000 mL | Freq: Once | INTRAMUSCULAR | Status: AC | PRN
  Administered 2022-07-19: 100 mL via INTRAVENOUS

## 2022-07-19 MED ORDER — SODIUM CHLORIDE 0.9 % IV SOLN: INTRAVENOUS | Status: DC

## 2022-07-19 MED ORDER — DEXTROSE-NACL 5-0.9 % IV SOLN: INTRAVENOUS | Status: DC

## 2022-07-19 MED ORDER — ONDANSETRON HCL 4 MG PO TABS: 4.0000 mg | ORAL_TABLET | Freq: Four times a day (QID) | ORAL | Status: DC | PRN

## 2022-07-19 MED ORDER — HYDROMORPHONE HCL 1 MG/ML IJ SOLN
0.5000 mg | Freq: Once | INTRAMUSCULAR | Status: AC
  Administered 2022-07-19: 0.5 mg via INTRAVENOUS
  Filled 2022-07-19: qty 0.5

## 2022-07-19 MED ORDER — ALPRAZOLAM 0.5 MG PO TABS
0.5000 mg | ORAL_TABLET | Freq: Four times a day (QID) | ORAL | Status: DC
  Administered 2022-07-19 – 2022-07-21 (×8): 0.5 mg via ORAL
  Filled 2022-07-19 (×8): qty 1

## 2022-07-19 MED ORDER — INSULIN ASPART 100 UNIT/ML IJ SOLN: 0.0000 [IU] | Freq: Every day | INTRAMUSCULAR | Status: DC

## 2022-07-19 NOTE — Inpatient Diabetes Management (Signed)
Inpatient Diabetes Program Recommendations  AACE/ADA: New Consensus Statement on Inpatient Glycemic Control (2015)  Target Ranges:  Prepandial:   less than 140 mg/dL      Peak postprandial:   less than 180 mg/dL (1-2 hours)      Critically ill patients:  140 - 180 mg/dL   Lab Results  Component Value Date   GLUCAP 184 (H) 07/19/2022   HGBA1C 7.9 (A) 05/31/2022    Review of Glycemic Control  Latest Reference Range & Units 07/19/22 14:56 07/19/22 15:08  Glucose-Capillary 70 - 99 mg/dL 66 (L) 184 (H)   Diabetes history: DM 2 Outpatient Diabetes medications:  Jardiance 25 mg daily, Toujeo 60 units q AM, Novolin R- 10-50 units bid Current orders for Inpatient glycemic control:  Semglee 60 units q AM  Inpatient Diabetes Program Recommendations:    Please add Novolog sensitive tid with meals and HS.  May need reduction in Semglee to 40 units daily.  Thanks,  Adah Perl, RN, BC-ADM Inpatient Diabetes Coordinator Pager [phone]  (8a-5p)

## 2022-07-19 NOTE — Assessment & Plan Note (Signed)
Triglycerides under good control with medications.  Continue medications.  Triglyceride level 21.

## 2022-07-19 NOTE — H&P (Signed)
[place]   PATIENT NAME: Shaun Odonnell    MR#:  [id number]  DATE OF BIRTH:  August 04, 1966  DATE OF ADMISSION:  07/19/2022  PRIMARY CARE PHYSICIAN: [place]   Patient is coming from: Home  REQUESTING/REFERRING PHYSICIAN: Valora Piccolo, MD  CHIEF COMPLAINT:   Chief Complaint  Patient presents with   Abdominal Pain    HISTORY OF PRESENT ILLNESS:  Shaun Odonnell is a 56 y.o., male with medical history significant for anxiety, asthma, bipolar disorder, chronic pancreatitis, type 2 diabetes mellitus and dyslipidemia as well as hypertension, who presented to the ER with acute onset of recurrent nausea and vomiting for the last 3 to 4 days with associated upper abdominal pain felt as a band in the upper abdomen and radiating to his back.  He denied any dysuria, oliguria or hematuria but has been having some urgency.  He admits to hot flashes and denied fever.  He admits to mild diarrhea without melena or bright red bleeding per rectum.  No chest pain or palpitations.  No cough or wheezing or dyspnea.  ED Course: When he did ER, BP was 146/88 with otherwise normal vital signs.  Labs revealed some 1.32 and CO2 21 with blood glucose of 281.  Serum lipase was 662 and CBC was within normal except for thrombocytopenia.  UA came back negative with high specific gravity though. EKG as reviewed by me : EKG showed normal sinus rhythm with a rate of 86 with T wave inversion laterally. Imaging: Abdominal and pelvic CT scan revealed the following: 1. Inflammatory change surrounding the pancreatic head/uncinate process most suspicious for acute on chronic pancreatitis with secondary inflammation of the adjacent duodenum. 2. Unchanged dilation of the main pancreatic duct measuring up to 7 mm with narrowing of the more distal duct. 3. Unchanged cystic lesion in the pancreatic tail measuring up to 3.3 cm, likely a pseudocyst. 4. Fatty infiltration of the liver. 5. Punctate  nonobstructing left lower pole renal stone.  The patient was a total of 1.5 mg of IV Dilaudid and 4 mg IV Zofran as well as 1 L bolus of IV normal saline.  He will be admitted to a medical bed for further evaluation and management. PAST MEDICAL HISTORY:   Past Medical History:  Diagnosis Date   Alcohol abuse 04/29/2019   Allergy    Anxiety    ARDS (adult respiratory distress syndrome) (HCC)    Asthma    Bipolar disorder (HCC)    Cataract    Chronic kidney disease    per pt - no current problems   COPD (chronic obstructive pulmonary disease) (HCC)    chronic cough and wheezing   Depression    Diabetes mellitus without complication ([place])    type 2   Dizziness    medication related   Dyspnea    with exertion   Elevated lipids    Fatty liver    Hypercholesterolemia    Hypertension    per pt, no current prob-no meds   Pancreatitis    Pneumonia    in past   Pulmonary embolism ([facility])    Schizoaffective disorder ([place])     PAST SURGICAL HISTORY:   Past Surgical History:  Procedure Laterality Date   BIOPSY  08/31/2021   Procedure: BIOPSY;  Surgeon: Rush Landmark Telford Nab., MD;  Location: [place];  Service: Gastroenterology;;   CATARACT EXTRACTION W/PHACO Right 03/26/2018   Procedure: CATARACT EXTRACTION PHACO AND INTRAOCULAR LENS PLACEMENT ([place]) RIGHT  BORDERLINE DIABETIC;  Surgeon: Leandrew Koyanagi, MD;  Location: [place];  Service: Ophthalmology;  Laterality: Right;   CATARACT EXTRACTION W/PHACO Left 04/23/2018   Procedure: CATARACT EXTRACTION PHACO AND INTRAOCULAR LENS PLACEMENT ([place])  LEFT BORDERLINE DIABETIC;  Surgeon: Leandrew Koyanagi, MD;  Location: [place];  Service: Ophthalmology;  Laterality: Left;   COLONOSCOPY     COLONOSCOPY WITH PROPOFOL N/A 08/21/2017   Procedure: COLONOSCOPY WITH PROPOFOL;  Surgeon: Manya Silvas, MD;  [facility];  Service: Endoscopy;  Laterality: N/A;   COLONOSCOPY WITH PROPOFOL N/A  12/15/2021   Procedure: COLONOSCOPY WITH BIOPSY;  Surgeon: [name], MD;  Location: [place];  Service: Endoscopy;  Laterality: N/A;  Diabetic   ENDOSCOPIC RETROGRADE CHOLANGIOPANCREATOGRAPHY (ERCP) WITH PROPOFOL N/A 08/31/2021   Procedure: ENDOSCOPIC RETROGRADE CHOLANGIOPANCREATOGRAPHY (ERCP) WITH PROPOFOL;  Surgeon: Rush Landmark Telford Nab., MD;  Location: [place];  Service: Gastroenterology;  Laterality: N/A;   ESOPHAGOGASTRODUODENOSCOPY (EGD) WITH PROPOFOL N/A 08/31/2021   Procedure: ESOPHAGOGASTRODUODENOSCOPY (EGD) WITH PROPOFOL;  Surgeon: Rush Landmark Telford Nab., MD;  Location: [place];  Service: Gastroenterology;  Laterality: N/A;   EYE SURGERY     FINE NEEDLE ASPIRATION N/A 08/31/2021   Procedure: FINE NEEDLE ASPIRATION (FNA) LINEAR;  Surgeon: Irving Copas., MD;  Location: [place];  Service: Gastroenterology;  Laterality: N/A;   HERNIA REPAIR     inguinal and umbilical   POLYPECTOMY N/A 12/15/2021   Procedure: POLYPECTOMY;  Surgeon: [name], MD;  Location: [place];  Service: Endoscopy;  Laterality: N/A;   RIB RESECTION N/A 08/20/2019   Procedure: removal of xyphoid process;  Surgeon: Nestor Lewandowsky, MD;  Location: [facility];  Service: Thoracic;  Laterality: N/A;   SPHINCTEROTOMY  08/31/2021   Procedure: Joan Mayans;  Surgeon: Mansouraty, Telford Nab., MD;  Location: [place];  Service: Gastroenterology;;   STENT REMOVAL     UMBILICAL HERNIA REPAIR N/A 06/22/2019   Procedure: OPEN HERNIA REPAIR UMBILICAL ADULT;  Surgeon: Olean Ree, MD;  Location: [facility];  Service: General;  Laterality: N/A;   UPPER ESOPHAGEAL ENDOSCOPIC ULTRASOUND (EUS) N/A 08/31/2021   Procedure: UPPER ESOPHAGEAL ENDOSCOPIC ULTRASOUND (EUS);  Surgeon: Irving Copas., MD;  Location: [place];  Service: Gastroenterology;  Laterality: N/A;   UPPER GI ENDOSCOPY      SOCIAL HISTORY:   Social History   Tobacco Use   Smoking status: Former     Packs/day: 0.50    Years: 34.00    Total pack years: 17.00    Types: Cigarettes    Quit date: 04/04/2022    Years since quitting: 0.2   Smokeless tobacco: Former    Types: Snuff    Quit date: 1990   Tobacco comments:    Still vaping daily.  Stopped smoking.  07/05/2022  Substance Use Topics   Alcohol use: Yes    Alcohol/week: 6.0 standard drinks of alcohol    Types: 6 Cans of beer per week    Comment: 3 beers 2x a week    FAMILY HISTORY:   Family History  Problem Relation Age of Onset   Hyperlipidemia Mother    Hypertension Father    Stroke Father    Diabetes Maternal Aunt    Dementia Maternal Grandmother    Heart disease Maternal Grandfather    Heart disease Paternal Grandmother    Stroke Paternal Grandfather    Diabetes Paternal Grandfather    Colon cancer Neg Hx    Esophageal cancer Neg Hx    Inflammatory bowel disease Neg Hx  Liver disease Neg Hx    Pancreatic cancer Neg Hx    Stomach cancer Neg Hx    Rectal cancer Neg Hx     DRUG ALLERGIES:   Allergies  Allergen Reactions   Fentanyl Nausea And Vomiting   Morphine And Related Shortness Of Breath   Neurontin [Gabapentin] Other (See Comments)    Caused severe over sedation   Clonazepam Other (See Comments)    Other reaction(s): "Dystonic"; alprazolam is a home med Dystonic Reaction   Pimozide Other (See Comments)    Other reaction(s): "Distonic" Dystonic Reaction   Trifluoperazine Other (See Comments)    Other (See Comments) "Distonic" Dystonic Reaction   Azithromycin Other (See Comments)    Was told not to take Z pak due to his heart   Montelukast     headache, nausea, feeling a little anxious, panic   Morphine Other (See Comments)    Other reaction(s): " I stop breathing"; can take Diluadid Cnnot tolerate d/t Drug Metabolism   Tramadol Nausea Only    REVIEW OF SYSTEMS:   ROS As per history of present illness. All pertinent systems were reviewed above. Constitutional, HEENT,  cardiovascular, respiratory, GI, GU, musculoskeletal, neuro, psychiatric, endocrine, integumentary and hematologic systems were reviewed and are otherwise negative/unremarkable except for positive findings mentioned above in the HPI.   MEDICATIONS AT HOME:   Prior to Admission medications   Medication Sig Start Date End Date Taking? Authorizing Provider  acetaminophen (TYLENOL) 500 MG tablet Take 1 tablet (500 mg total) by mouth every 6 (six) hours as needed. 11/22/21  Yes Mansouraty, Telford Nab., MD  ALPRAZolam Duanne Moron) 0.5 MG tablet Take 0.5 mg by mouth 4 (four) times daily. Patient takes when wakes up(~0600)/ 1000/ 1600/ 2000 06/26/18  Yes [provider]  celecoxib (CELEBREX) 200 MG capsule Take 200 mg by mouth daily. 06/22/22  Yes [provider]  diclofenac Sodium (VOLTAREN) 1 % GEL Apply 2 g topically 4 (four) times daily. 12/11/21  Yes Vigg, Avanti, MD  empagliflozin (JARDIANCE) 25 MG TABS tablet Take 1 tablet (25 mg total) by mouth daily. 05/31/22  Yes Shamleffer, Melanie Crazier, MD  Eszopiclone 3 MG TABS Take 3 mg by mouth at bedtime as needed. 02/23/22  Yes [provider]  fenofibrate (TRICOR) 145 MG tablet Take 1 tablet (145 mg total) by mouth daily. 05/11/22  Yes Cannady, Jolene T, NP  ibuprofen (ADVIL) 400 MG tablet Take 1 tablet (400 mg total) by mouth every 6 (six) hours as needed. Patient taking differently: Take 400 mg by mouth every 6 (six) hours as needed for mild pain. 11/22/21  Yes Mansouraty, Telford Nab., MD  insulin glargine, 2 Unit Dial, (TOUJEO MAX SOLOSTAR) 300 UNIT/ML Solostar Pen Inject 74 Units into the skin daily in the afternoon. Patient taking differently: Inject 60 Units into the skin every morning. 05/31/22  Yes Shamleffer, Melanie Crazier, MD  Insulin Regular Human (NOVOLIN R FLEXPEN RELION) 100 UNIT/ML KwikPen Max daily 50 units per scale Patient taking differently: Inject 10-50 Units into the skin 2 (two) times daily before a meal. Max daily  50 units per scale 10 units ac meals and addition on sliding scale. 05/31/22  Yes Shamleffer, Melanie Crazier, MD  OLANZapine (ZYPREXA) 20 MG tablet Take 20 mg by mouth at bedtime. 04/04/21  Yes [provider]  omeprazole (PRILOSEC) 40 MG capsule TAKE 1 CAPSULE BY MOUTH ONCE DAILY 07/02/22  Yes Mansouraty, Telford Nab., MD  ondansetron (ZOFRAN-ODT) 8 MG disintegrating tablet Take 1 tablet (8 mg  total) by mouth every 8 (eight) hours as needed for nausea or vomiting. 01/10/22  Yes Vigg, Avanti, MD  rosuvastatin (CRESTOR) 40 MG tablet Take 1 tablet (40 mg total) by mouth daily. 06/15/22  Yes Cannady, Jolene T, NP  sertraline (ZOLOFT) 100 MG tablet Take 100 mg by mouth daily.    Yes [provider]  Acetone, Urine, Test (KETONE TEST) STRP 1 each by In Vitro route daily in the afternoon. 05/22/22   Shamleffer, Melanie Crazier, MD  albuterol (PROVENTIL) (2.5 MG/3ML) 0.083% nebulizer solution Take 3 mLs (2.5 mg total) by nebulization every 6 (six) hours as needed for wheezing or shortness of breath. Patient not taking: Reported on 07/05/2022 05/18/21   Chesley Mires, MD  albuterol (VENTOLIN HFA) 108 (90 Base) MCG/ACT inhaler INHALE 2 PUFFS BY MOUTH EVERY 6 HOURS ASNEEDED WHEEZING/ SHORTNESS OF BREATH Patient not taking: Reported on 07/05/2022 03/02/21   Birdie Sons, MD  budesonide-formoterol [facility]) 80-4.5 MCG/ACT inhaler Inhale 2 puffs into the lungs in the morning and at bedtime. Patient not taking: Reported on 07/05/2022 05/18/21   Chesley Mires, MD  carboxymethylcellulose (REFRESH PLUS) 0.5 % SOLN Place 2 drops into both eyes daily as needed (Dry eyes). Patient not taking: Reported on 07/19/2022    [provider]  Continuous Blood Gluc Sensor (DEXCOM G6 SENSOR) MISC 1 Device by Does not apply route See admin instructions. Change every 10 days 05/31/22   Shamleffer, Melanie Crazier, MD  Continuous Blood Gluc Transmit (DEXCOM G6 TRANSMITTER) MISC 1 Device by Other route as directed.  Every 10 days 05/31/22   Shamleffer, Melanie Crazier, MD  glucose blood test strip Use as instructed 12/17/19   Trinna Post, PA-C  [facility] INSULIN SYRINGES 30G X 5/16" 1 ML MISC USE AS DIRECTED. WITH BOTH INSULINS 5 TIMES A DAY 06/04/22   Shamleffer, Melanie Crazier, MD  Insulin Pen Needle (ULTRACARE PEN NEEDLES) 32G X 4 MM MISC Use to inject insulin as needed 07/05/22   Shamleffer, Melanie Crazier, MD  OneTouch Delica Lancets 30Q MISC USE TO CHECK FASTING SUGAR TWICE DAILY 12/07/20   Trinna Post, PA-C      VITAL SIGNS:  Blood pressure (!) 130/119, pulse 70, temperature 98.2 F (36.8 C), resp. rate 16, height '5\' 8"'$  (1.727 m), weight 93.4 kg, SpO2 95 %.  PHYSICAL EXAMINATION:  Physical Exam  GENERAL:  56 y.o.-year-old patient lying in the bed with no acute distress.  EYES: Pupils equal, round, reactive to light and accommodation. No scleral icterus. Extraocular muscles intact.  HEENT: Head atraumatic, normocephalic. Oropharynx and nasopharynx clear.  NECK:  Supple, no jugular venous distention. No thyroid enlargement, no tenderness.  LUNGS: Normal breath sounds bilaterally, no wheezing, rales,rhonchi or crepitation. No use of accessory muscles of respiration.  CARDIOVASCULAR: Regular rate and rhythm, S1, S2 normal. No murmurs, rubs, or gallops.  ABDOMEN: Soft, nondistended, with epigastric and left upper quadrant abdominal tenderness without advantageous guarding or rigidity.  Bowel sounds present. No organomegaly or mass.  EXTREMITIES: No pedal edema, cyanosis, or clubbing.  NEUROLOGIC: Cranial nerves II through XII are intact. Muscle strength 5/5 in all extremities. Sensation intact. Gait not checked.  PSYCHIATRIC: The patient is alert and oriented x 3.  Normal affect and good eye contact. SKIN: No obvious rash, lesion, or ulcer.   LABORATORY PANEL:   CBC Recent Labs  Lab 07/19/22 1213  WBC 5.5  HGB 13.5  HCT 41.1  PLT 107*    ------------------------------------------------------------------------------------------------------------------  Chemistries  Recent Labs  Lab 07/19/22  0809 07/19/22 1213  NA 132*  --   K 4.0  --   CL 98  --   CO2 21*  --   GLUCOSE 281*  --   BUN 19  --   CREATININE 0.82 0.58*  CALCIUM 9.1  --   AST 22  --   ALT 19  --   ALKPHOS 74  --   BILITOT 0.7  --    ------------------------------------------------------------------------------------------------------------------  Cardiac Enzymes No results for input(s): "TROPONINI" in the last 168 hours. ------------------------------------------------------------------------------------------------------------------  RADIOLOGY:  CT Abdomen Pelvis W Contrast  Result Date: 07/19/2022 CLINICAL DATA:  Upper abdominal pain for 3 days with nausea and vomiting. EXAM: CT ABDOMEN AND PELVIS WITH CONTRAST TECHNIQUE: Multidetector CT imaging of the abdomen and pelvis was performed using the standard protocol following bolus administration of intravenous contrast. RADIATION DOSE REDUCTION: This exam was performed according to the departmental dose-optimization program which includes automated exposure control, adjustment of the mA and/or kV according to patient size and/or use of iterative reconstruction technique. CONTRAST:  159m OMNIPAQUE IOHEXOL 300 MG/ML  SOLN COMPARISON:  CT abdomen/pelvis 05/22/2022 FINDINGS: Lower chest: The lung bases are clear. The imaged heart is unremarkable. Hepatobiliary: The liver is diffusely hypoattenuating consistent with fatty infiltration. There are no focal lesions. There is no biliary ductal dilatation. Pancreas: There is inflammatory change surrounding the pancreatic head/uncinate process which is new from the prior study. The main pancreatic duct is again dilated measuring up to 7 mm in the region of the head with narrowing of the more distal duct, unchanged. Small calcifications in the pancreatic head are  unchanged. The 3.3 cm cystic lesion in the pancreatic tail is unchanged in size or appearance. There is no surrounding inflammatory change. Spleen: Unremarkable. Adrenals/Urinary Tract: The adrenals are unremarkable. There is a punctate nonobstructing left lower pole renal stone (5-62). There are no other stones in either kidney or along the course of either ureter. There are no focal lesions. There is no hydronephrosis or hydroureter. There is symmetric excretion of contrast into the collecting systems on the delayed images. The bladder is unremarkable. Stomach/Bowel: The stomach is unremarkable. There is mucosal hyperemia and mild wall thickening in the duodenum likely related to the adjacent pancreatitis. There is no other abnormal bowel wall thickening or inflammatory change. The appendix is not definitively identified. Vascular/Lymphatic: There is scattered mild calcified plaque in the nonaneurysmal abdominal aorta. Major branch vessels are patent. The main portal and splenic veins are patent. There is no abdominopelvic lymphadenopathy. Reproductive: The prostate and seminal vesicles are unremarkable. Other: There is trace free fluid in the midabdomen likely related to the pancreatitis. There is no free intraperitoneal air. Musculoskeletal: There is no acute osseous abnormality or suspicious osseous lesion. IMPRESSION: 1. Inflammatory change surrounding the pancreatic head/uncinate process most suspicious for acute on chronic pancreatitis with secondary inflammation of the adjacent duodenum. 2. Unchanged dilation of the main pancreatic duct measuring up to 7 mm with narrowing of the more distal duct. 3. Unchanged cystic lesion in the pancreatic tail measuring up to 3.3 cm, likely a pseudocyst. 4. Fatty infiltration of the liver. 5. Punctate nonobstructing left lower pole renal stone. Electronically Signed   By: PValetta MoleM.D.   On: 07/19/2022 11:20      IMPRESSION AND PLAN:  Assessment and Plan: Acute  on chronic pancreatitis ([facility] - The patient will be admitted to a medical bed. - He will be kept NPO. - We will follow serial lipase levels. - Pain management  will be provided.   Uncontrolled type 2 diabetes mellitus with hyperglycemia, with long-term current use of insulin (HCC) With NovoLog- The patient will be placed on supplemental coverage with NovoLog. - We will continue basal coverage.  Hypertriglyceridemia - This could be contributing to his pancreatitis. -We will continue Tricor and Crestor and follow triglycerides level  Anxiety and depression - We will continue Xanax and Zoloft.       DVT prophylaxis: Lovenox.  Advanced Care Planning:  Code Status: full code.  Family Communication:  The plan of care was discussed in details with the patient (and family). I answered all questions. The patient agreed to proceed with the above mentioned plan. Further management will depend upon hospital course. Disposition Plan: Back to previous home environment Consults called: none.  All the records are reviewed and case discussed with ED provider.  Status is: Inpatient    At the time of the admission, it appears that the appropriate admission status for this patient is inpatient.  This is judged to be reasonable and necessary in order to provide the required intensity of service to ensure the patient's safety given the presenting symptoms, physical exam findings and initial radiographic and laboratory data in the context of comorbid conditions.  The patient requires inpatient status due to high intensity of service, high risk of further deterioration and high frequency of surveillance required.  I certify that at the time of admission, it is my clinical judgment that the patient will require inpatient hospital care extending more than 2 midnights.                            Dispo: The patient is from: Home              Anticipated d/c is to: Home              Patient currently is  not medically stable to d/c.              Difficult to place patient: No  Christel Mormon M.D on 07/19/2022 at 3:12 PM  [facility]   From 7 PM-7 AM, contact night-coverage [email or web address]  CC: Primary care physician; [place]

## 2022-07-19 NOTE — Assessment & Plan Note (Signed)
-   The patient will be placed on supplement coverage with NovoLog 

## 2022-07-19 NOTE — Progress Notes (Signed)
Patients alerted this nurse that he felt his blood sugar was low. RN checked blood sugar - result is 63. Standing hypoglycemia protocol initiate. D50 12.5 gram administered. RN will recheck blood sugar in 15 mins per protocol.

## 2022-07-19 NOTE — Assessment & Plan Note (Signed)
With NovoLog- The patient will be placed on supplemental coverage with NovoLog. - We will continue basal coverage.

## 2022-07-19 NOTE — Assessment & Plan Note (Signed)
Continue Xanax and Zoloft. ?

## 2022-07-19 NOTE — ED Provider Notes (Signed)
[facility] Provider Note   Event Date/Time   First MD Initiated Contact with Patient 07/19/22 [phone]     (approximate) History  Abdominal Pain  HPI Shaun Odonnell is a 56 y.o. male with a past medical history of alcohol abuse and chronic pancreatitis who presents for midepigastric pain that has been present over the last week and worsening over that time.  Patient states that he has been attempting to cut down on his alcohol intake but does state having a beer prior to the onset of the symptoms.  Patient endorses associated nausea and vomiting. ROS: Patient currently denies any vision changes, tinnitus, difficulty speaking, facial droop, sore throat, chest pain, shortness of breath, abdominal pain, diarrhea, dysuria, or weakness/numbness/paresthesias in any extremity   Physical Exam  Triage Vital Signs: ED Triage Vitals  Enc Vitals Group     BP 07/19/22 0813 (!) 146/88     Pulse Rate 07/19/22 0813 90     Resp 07/19/22 0813 18     Temp 07/19/22 0816 98.1 F (36.7 C)     Temp Source 07/19/22 0816 Oral     SpO2 07/19/22 0813 95 %     Weight 07/19/22 0814 206 lb (93.4 kg)     Height 07/19/22 0814 '5\' 8"'$  (1.727 m)     Head Circumference --      Peak Flow --      Pain Score 07/19/22 0813 7     Pain Loc --      Pain Edu? --      Excl. in [place]? --    Most recent vital signs: Vitals:   07/19/22 1300 07/19/22 1352  BP: 127/79 (!) 130/119  Pulse: 75 70  Resp: 14 16  Temp:  98.2 F (36.8 C)  SpO2: 92% 95%   General: Awake, oriented x4. CV:  Good peripheral perfusion.  Resp:  Normal effort.  Abd:  No distention.  Tenderness to palpation over midepigastric region Other:  Middle-aged obese Caucasian male laying in bed in no acute distress ED Results / Procedures / Treatments  Labs (all labs ordered are listed, but only abnormal results are displayed) Labs Reviewed  LIPASE, BLOOD - Abnormal; Notable for the following components:      Result Value   Lipase  662 (*)    All other components within normal limits  COMPREHENSIVE METABOLIC PANEL - Abnormal; Notable for the following components:   Sodium 132 (*)    CO2 21 (*)    Glucose, Bld 281 (*)    All other components within normal limits  CBC - Abnormal; Notable for the following components:   Platelets 138 (*)    All other components within normal limits  URINALYSIS, ROUTINE W REFLEX MICROSCOPIC - Abnormal; Notable for the following components:   Color, Urine STRAW (*)    APPearance CLEAR (*)    Specific Gravity, Urine 1.033 (*)    Glucose, UA >=500 (*)    Ketones, ur 5 (*)    All other components within normal limits  CBC - Abnormal; Notable for the following components:   Platelets 107 (*)    All other components within normal limits  CREATININE, SERUM - Abnormal; Notable for the following components:   Creatinine, Ser 0.58 (*)    All other components within normal limits  GLUCOSE, CAPILLARY - Abnormal; Notable for the following components:   Glucose-Capillary 66 (*)    All other components within normal limits  GLUCOSE, CAPILLARY - Abnormal; Notable  for the following components:   Glucose-Capillary 184 (*)    All other components within normal limits  HIV ANTIBODY (ROUTINE TESTING W REFLEX)   EKG ED ECG REPORT I, Naaman Plummer, the attending physician, personally viewed and interpreted this ECG. Date: 07/19/2022 EKG Time: 0824 Rate: 86 Rhythm: normal sinus rhythm QRS Axis: normal Intervals: normal ST/T Wave abnormalities: normal Narrative Interpretation: no evidence of acute ischemia RADIOLOGY ED MD interpretation: CT of the abdomen and pelvis with IV contrast shows inflammatory change surrounding the pancreatic head and uncinate process likely from acute on chronic pancreatitis as well as unchanged dilation of the main pancreatic duct.  There is also an unchanged cystic lesion in the pancreatic tail measuring up to 3.3 cm likely reflecting a pseudocyst -Agree with  radiology assessment Official radiology report(s): CT Abdomen Pelvis W Contrast  Result Date: 07/19/2022 CLINICAL DATA:  Upper abdominal pain for 3 days with nausea and vomiting. EXAM: CT ABDOMEN AND PELVIS WITH CONTRAST TECHNIQUE: Multidetector CT imaging of the abdomen and pelvis was performed using the standard protocol following bolus administration of intravenous contrast. RADIATION DOSE REDUCTION: This exam was performed according to the departmental dose-optimization program which includes automated exposure control, adjustment of the mA and/or kV according to patient size and/or use of iterative reconstruction technique. CONTRAST:  117m OMNIPAQUE IOHEXOL 300 MG/ML  SOLN COMPARISON:  CT abdomen/pelvis 05/22/2022 FINDINGS: Lower chest: The lung bases are clear. The imaged heart is unremarkable. Hepatobiliary: The liver is diffusely hypoattenuating consistent with fatty infiltration. There are no focal lesions. There is no biliary ductal dilatation. Pancreas: There is inflammatory change surrounding the pancreatic head/uncinate process which is new from the prior study. The main pancreatic duct is again dilated measuring up to 7 mm in the region of the head with narrowing of the more distal duct, unchanged. Small calcifications in the pancreatic head are unchanged. The 3.3 cm cystic lesion in the pancreatic tail is unchanged in size or appearance. There is no surrounding inflammatory change. Spleen: Unremarkable. Adrenals/Urinary Tract: The adrenals are unremarkable. There is a punctate nonobstructing left lower pole renal stone (5-62). There are no other stones in either kidney or along the course of either ureter. There are no focal lesions. There is no hydronephrosis or hydroureter. There is symmetric excretion of contrast into the collecting systems on the delayed images. The bladder is unremarkable. Stomach/Bowel: The stomach is unremarkable. There is mucosal hyperemia and mild wall thickening in the  duodenum likely related to the adjacent pancreatitis. There is no other abnormal bowel wall thickening or inflammatory change. The appendix is not definitively identified. Vascular/Lymphatic: There is scattered mild calcified plaque in the nonaneurysmal abdominal aorta. Major branch vessels are patent. The main portal and splenic veins are patent. There is no abdominopelvic lymphadenopathy. Reproductive: The prostate and seminal vesicles are unremarkable. Other: There is trace free fluid in the midabdomen likely related to the pancreatitis. There is no free intraperitoneal air. Musculoskeletal: There is no acute osseous abnormality or suspicious osseous lesion. IMPRESSION: 1. Inflammatory change surrounding the pancreatic head/uncinate process most suspicious for acute on chronic pancreatitis with secondary inflammation of the adjacent duodenum. 2. Unchanged dilation of the main pancreatic duct measuring up to 7 mm with narrowing of the more distal duct. 3. Unchanged cystic lesion in the pancreatic tail measuring up to 3.3 cm, likely a pseudocyst. 4. Fatty infiltration of the liver. 5. Punctate nonobstructing left lower pole renal stone. Electronically Signed   By: PValetta MoleM.D.   On:  07/19/2022 11:20   PROCEDURES: Critical Care performed: No .1-3 Lead EKG Interpretation  Performed by: Naaman Plummer, MD Authorized by: Naaman Plummer, MD     Interpretation: normal     ECG rate:  71   ECG rate assessment: normal     Rhythm: sinus rhythm     Ectopy: none     Conduction: normal    MEDICATIONS ORDERED IN ED: Medications  HYDROmorphone (DILAUDID) injection 1 mg (1 mg Intravenous Given 07/19/22 1202)  enoxaparin (LOVENOX) injection 40 mg (40 mg Subcutaneous Given 07/19/22 1357)  0.9 %  sodium chloride infusion ( Intravenous Restarted 07/19/22 1351)  acetaminophen (TYLENOL) tablet 650 mg (has no administration in time range)    Or  acetaminophen (TYLENOL) suppository 650 mg (has no administration in  time range)  traZODone (DESYREL) tablet 25 mg (has no administration in time range)  magnesium hydroxide (MILK OF MAGNESIA) suspension 30 mL (has no administration in time range)  ondansetron (ZOFRAN) tablet 4 mg (has no administration in time range)    Or  ondansetron (ZOFRAN) injection 4 mg (has no administration in time range)  ketorolac (TORADOL) 15 MG/ML injection 15 mg (15 mg Intravenous Given 07/19/22 1357)  fenofibrate tablet 160 mg (has no administration in time range)  rosuvastatin (CRESTOR) tablet 40 mg (has no administration in time range)  ALPRAZolam (XANAX) tablet 0.5 mg (0.5 mg Oral Given 07/19/22 1426)  zolpidem (AMBIEN) tablet 5 mg (has no administration in time range)  sertraline (ZOLOFT) tablet 100 mg (has no administration in time range)  OLANZapine (ZYPREXA) tablet 20 mg (has no administration in time range)  pantoprazole (PROTONIX) EC tablet 40 mg (has no administration in time range)  dextrose 5 %-0.9 % sodium chloride infusion ( Intravenous New Bag/Given 07/19/22 1459)  sodium chloride 0.9 % bolus 1,000 mL (0 mLs Intravenous Stopped 07/19/22 1025)  ondansetron (ZOFRAN) injection 4 mg (4 mg Intravenous Given 07/19/22 0913)  HYDROmorphone (DILAUDID) injection 1 mg (1 mg Intravenous Given 07/19/22 0913)  HYDROmorphone (DILAUDID) injection 0.5 mg (0.5 mg Intravenous Given 07/19/22 1022)  lactated ringers bolus 1,000 mL (0 mLs Intravenous Stopped 07/19/22 1225)  iohexol (OMNIPAQUE) 300 MG/ML solution 100 mL (100 mLs Intravenous Contrast Given 07/19/22 1055)  dextrose 50 % solution 50 mL (50 mLs Intravenous Given 07/19/22 1457)   IMPRESSION / MDM / ASSESSMENT AND PLAN / ED COURSE  I reviewed the triage vital signs and the nursing notes.                             The patient is on the cardiac monitor to evaluate for evidence of arrhythmia and/or significant heart rate changes. Patient's presentation is most consistent with acute presentation with potential threat to life or  bodily function. Given history and exam I have a low suspicion for AAA, SBO, appendicitis, mesenteric ischemia, nephrolithiasis, pyelonephritis, or diverticulitis. I have a moderate concern for pancreatitis, gastritis vs non-bleeding peptic ulcer, or hepatobiliary disease and thus will obtain labs and imaging.  ED Workup: CBC, BMP, LFTs, Lipase, LDH  No signs of severe pancreatitis on exam such as ecchymosis of periumbilical region or flanks, hypoxia, or tachypneia.  Disposition: Admit for bowel rest, continued analgesia, monitoring.   FINAL CLINICAL IMPRESSION(S) / ED DIAGNOSES   Final diagnoses:  Acute on chronic pancreatitis ([place])   Rx / DC Orders   ED Discharge Orders     None      Note:  This  document was prepared using Systems analyst and may include unintentional dictation errors.   Naaman Plummer, MD 07/19/22 [phone]

## 2022-07-19 NOTE — Assessment & Plan Note (Addendum)
-   The patient will be admitted to a medical bed. - The patient has associated stable pancreatic pseudocyst and dilatation of the pancreatic duct without evidence for obstruction. - He will be kept NPO. - We will follow serial lipase levels. - Pain management will be provided.

## 2022-07-19 NOTE — ED Triage Notes (Signed)
Pt states he has been having upper abd pain x3 days across the whole top- pt has had some vomiting and diarrhea with it as well- pt states that this has happened before and has an appointment with GI

## 2022-07-20 DIAGNOSIS — K863 Pseudocyst of pancreas: Secondary | ICD-10-CM | POA: Diagnosis not present

## 2022-07-20 DIAGNOSIS — E11649 Type 2 diabetes mellitus with hypoglycemia without coma: Secondary | ICD-10-CM

## 2022-07-20 DIAGNOSIS — E119 Type 2 diabetes mellitus without complications: Secondary | ICD-10-CM

## 2022-07-20 DIAGNOSIS — E781 Pure hyperglyceridemia: Secondary | ICD-10-CM | POA: Diagnosis not present

## 2022-07-20 DIAGNOSIS — K859 Acute pancreatitis without necrosis or infection, unspecified: Secondary | ICD-10-CM | POA: Diagnosis not present

## 2022-07-20 LAB — COMPREHENSIVE METABOLIC PANEL
ALT: 37 U/L (ref 0–44)
AST: 67 U/L — ABNORMAL HIGH (ref 15–41)
Albumin: 3.7 g/dL (ref 3.5–5.0)
Alkaline Phosphatase: 127 U/L — ABNORMAL HIGH (ref 38–126)
Anion gap: 3 — ABNORMAL LOW (ref 5–15)
BUN: 13 mg/dL (ref 6–20)
CO2: 28 mmol/L (ref 22–32)
Calcium: 8.6 mg/dL — ABNORMAL LOW (ref 8.9–10.3)
Chloride: 106 mmol/L (ref 98–111)
Creatinine, Ser: 0.62 mg/dL (ref 0.61–1.24)
GFR, Estimated: >60 mL/min (ref >60)
Glucose, Bld: 76 mg/dL (ref 70–99)
Potassium: 4.1 mmol/L (ref 3.5–5.1)
Sodium: 137 mmol/L (ref 135–145)
Total Bilirubin: 0.4 mg/dL (ref 0.3–1.2)
Total Protein: 7 g/dL (ref 6.5–8.1)

## 2022-07-20 LAB — GLUCOSE, CAPILLARY
Glucose-Capillary: 68 mg/dL — ABNORMAL LOW (ref 70–99)
Glucose-Capillary: 187 mg/dL — ABNORMAL HIGH (ref 70–99)
Glucose-Capillary: 68 mg/dL — ABNORMAL LOW (ref 70–99)
Glucose-Capillary: 121 mg/dL — ABNORMAL HIGH (ref 70–99)
Glucose-Capillary: 137 mg/dL — ABNORMAL HIGH (ref 70–99)

## 2022-07-20 LAB — CBC
HCT: 41.7 % (ref 39.0–52.0)
Hemoglobin: 13.4 g/dL (ref 13.0–17.0)
MCH: 29.8 pg (ref 26.0–34.0)
MCHC: 32.1 g/dL (ref 30.0–36.0)
MCV: 92.7 fL (ref 80.0–100.0)
Platelets: 126 10*3/uL — ABNORMAL LOW (ref 150–400)
RBC: 4.5 MIL/uL (ref 4.22–5.81)
RDW: 13.2 % (ref 11.5–15.5)
WBC: 5 10*3/uL (ref 4.0–10.5)
nRBC: 0 % (ref 0.0–0.2)

## 2022-07-20 LAB — LIPID PANEL
Cholesterol: 113 mg/dL (ref 0–200)
HDL: 47 mg/dL (ref >40)
LDL Cholesterol: 42 mg/dL (ref 0–99)
Total CHOL/HDL Ratio: 2.4 RATIO
Triglycerides: 121 mg/dL (ref <150)
VLDL: 24 mg/dL (ref 0–40)

## 2022-07-20 LAB — LIPASE, BLOOD: Lipase: 115 U/L — ABNORMAL HIGH (ref 11–51)

## 2022-07-20 MED ORDER — DEXTROSE 50 % IV SOLN
25.0000 g | Freq: Once | INTRAVENOUS | Status: AC
  Administered 2022-07-20: 25 g via INTRAVENOUS
  Filled 2022-07-20: qty 50

## 2022-07-20 MED ORDER — DEXTROSE 50 % IV SOLN: 12.5000 g | INTRAVENOUS | Status: DC

## 2022-07-20 NOTE — Plan of Care (Signed)

## 2022-07-20 NOTE — Progress Notes (Signed)
Inpatient Diabetes Program Recommendations  AACE/ADA: New Consensus Statement on Inpatient Glycemic Control (2015)  Target Ranges:  Prepandial:   less than 140 mg/dL      Peak postprandial:   less than 180 mg/dL (1-2 hours)      Critically ill patients:  140 - 180 mg/dL   Lab Results  Component Value Date   GLUCAP 187 (H) 07/20/2022   HGBA1C 7.9 (A) 05/31/2022    Review of Glycemic Control  Latest Reference Range & Units 07/20/22 06:22 07/20/22 07:49 07/20/22 08:25  Glucose-Capillary 70 - 99 mg/dL 68 (L) 68 (L) 187 (H)  Diabetes history: DM 2 Outpatient Diabetes medications:  Jardiance 25 mg daily, Toujeo 60 units q AM, Novolin R- 10-50 units bid Current orders for Inpatient glycemic control:  Novolog 0-9 units tid with meals and HS  Inpatient Diabetes Program Recommendations:   Note patient continues to have hypoglycemia.  Agree with current orders.  Likely needs reduction in home DM medications at discharge.  Will follow.   Thanks,  Adah Perl, RN, BC-ADM Inpatient Diabetes Coordinator Pager [phone]  (8a-5p)

## 2022-07-20 NOTE — Progress Notes (Signed)
Patient latest blood sugar is 68 mgdL, NP B. Randol Kern notified.

## 2022-07-20 NOTE — Plan of Care (Signed)
  Problem: Education: Goal: Knowledge of General Education information will improve Description: Including pain rating scale, medication(s)/side effects and non-pharmacologic comfort measures Outcome: Progressing   Problem: Health Behavior/Discharge Planning: Goal: Ability to manage health-related needs will improve Outcome: Progressing   Problem: Clinical Measurements: Goal: Ability to maintain clinical measurements within normal limits will improve Outcome: Progressing Goal: Diagnostic test results will improve Outcome: Progressing Goal: Respiratory complications will improve Outcome: Progressing Goal: Cardiovascular complication will be avoided Outcome: Progressing   Problem: Activity: Goal: Risk for activity intolerance will decrease Outcome: Progressing   Problem: Coping: Goal: Level of anxiety will decrease Outcome: Progressing   Problem: Elimination: Goal: Will not experience complications related to urinary retention Outcome: Progressing

## 2022-07-20 NOTE — Assessment & Plan Note (Addendum)
Patient was not given any insulin during the hospital course he was actually on D5 drip overnight.  The patient has a Dexcom meter and he will follow-up for sugars.  Case discussed with diabetes coordinator and will give 3 units of short acting insulin prior to meals and 15 units of Toujeo insulin daily.  Advised to follow-up with his endocrinologist and adjust insulins.

## 2022-07-20 NOTE — Progress Notes (Signed)
  Progress Note   Patient: Shaun Odonnell KWI:[id number] DOB: 14-Dec-1965 DOA: 07/19/2022     1 DOS: the patient was seen and examined on 07/20/2022   Assessment and Plan: Acute on chronic pancreatitis ([place]) - IV fluid hydration - The patient has stable pancreatic pseudocyst and dilatation of the pancreatic duct without evidence for obstruction. - Advance diet as tolerated.   Uncontrolled type 2 diabetes mellitus with hypoglycemia, with long-term current use of insulin ([place]) We have pushed a D5 this morning.  Continue dextrose and IV fluids.  Hold insulin at this time.  Holding Jardiance at this time.  Likely will need a lot lower dose of Tradjenta upon discharge.  Hypertriglyceridemia Triglycerides under good control with medications.  Continue medications.  Triglyceride level 21.  Pancreatic pseudocyst Will need outpatient follow-up for this  Anxiety and depression Continue Xanax and Zoloft.        Subjective: Patient was feeling better this morning.  Wanted to advance diet.  Has history of pancreatitis with pancreatic stones.  Came in with acute on chronic pancreatitis.  Patient also has some low blood sugars.  Physical Exam: Vitals:   07/19/22 1352 07/19/22 1929 07/20/22 0347 07/20/22 0740  BP: (!) 130/119 124/82 121/79 (!) 140/77  Pulse: 70 (!) 59 68 77  Resp: '16 17 17 20  '$ Temp: 98.2 F (36.8 C) 98.3 F (36.8 C) 98.3 F (36.8 C) 98.2 F (36.8 C)  TempSrc:      SpO2: 95% 96% 94% 95%  Weight:      Height:       Physical Exam HENT:     Head: Normocephalic.     Mouth/Throat:     Pharynx: No oropharyngeal exudate.  Eyes:     General: Lids are normal.     Conjunctiva/sclera: Conjunctivae normal.  Cardiovascular:     Rate and Rhythm: Normal rate and regular rhythm.     Heart sounds: Normal heart sounds, S1 normal and S2 normal.  Pulmonary:     Breath sounds: No decreased breath sounds, wheezing, rhonchi or rales.  Abdominal:     Tenderness: There is abdominal  tenderness in the epigastric area.  Musculoskeletal:     Right lower leg: No swelling.     Left lower leg: No swelling.  Skin:    General: Skin is warm.     Findings: No rash.  Neurological:     Mental Status: He is alert and oriented to person, place, and time.     Data Reviewed: Creatinine 0.62, lipase 662 and down to 115, AST 67, triglycerides 121, platelets 126, hemoglobin 13.4, sugar this morning 68 on D5 drip   Disposition: Status is: Inpatient Remains inpatient appropriate because: Treating for pancreatitis with IV fluids.  Watching for hypoglycemia on D5 drip.  Planned Discharge Destination: Home    Time spent: 28 minutes  Author: Loletha Grayer, MD 07/20/2022 3:04 PM  For on call review [email or web address].

## 2022-07-20 NOTE — Assessment & Plan Note (Signed)
Will need outpatient follow-up for this

## 2022-07-20 NOTE — Progress Notes (Signed)
Patient blood sugar '67mg'$ dL, NP B. Morisson notified, D50 12.5g administered as ordered. Blood sugar recheck after 15 mins.

## 2022-07-20 NOTE — TOC Progression Note (Signed)
Transition of Care [facility]) - Progression Note    Patient Details  Name: Shaun Odonnell MRN: [id number] Date of Birth: 12/18/1965  Transition of Care [facility]) CM/SW [place], RN Phone Number: 07/20/2022, 12:26 PM  Clinical Narrative:     Transition of Care (TOC) Screening Note   Patient Details  Name: [name] Date of Birth: [birth year]   Transition of Care [facility]) CM/SW Contact:    Conception Oms, RN Phone Number: 07/20/2022, 12:26 PM    Transition of Care Department [facility]) has reviewed patient and no TOC needs have been identified at this time. We will continue to monitor patient advancement through interdisciplinary progression rounds. If new patient transition needs arise, please place a TOC consult.          Expected Discharge Plan and Services                                                 Social Determinants of Health (SDOH) Interventions    Readmission Risk Interventions     No data to display

## 2022-07-21 DIAGNOSIS — K863 Pseudocyst of pancreas: Secondary | ICD-10-CM | POA: Diagnosis not present

## 2022-07-21 DIAGNOSIS — E781 Pure hyperglyceridemia: Secondary | ICD-10-CM | POA: Diagnosis not present

## 2022-07-21 DIAGNOSIS — E11649 Type 2 diabetes mellitus with hypoglycemia without coma: Secondary | ICD-10-CM | POA: Diagnosis not present

## 2022-07-21 DIAGNOSIS — K859 Acute pancreatitis without necrosis or infection, unspecified: Secondary | ICD-10-CM | POA: Diagnosis not present

## 2022-07-21 LAB — BASIC METABOLIC PANEL
Anion gap: 9 (ref 5–15)
BUN: 11 mg/dL (ref 6–20)
CO2: 25 mmol/L (ref 22–32)
Calcium: 9 mg/dL (ref 8.9–10.3)
Chloride: 104 mmol/L (ref 98–111)
Creatinine, Ser: 0.72 mg/dL (ref 0.61–1.24)
GFR, Estimated: >60 mL/min (ref >60)
Glucose, Bld: 162 mg/dL — ABNORMAL HIGH (ref 70–99)
Potassium: 3.9 mmol/L (ref 3.5–5.1)
Sodium: 138 mmol/L (ref 135–145)

## 2022-07-21 LAB — CBC
HCT: 42.3 % (ref 39.0–52.0)
Hemoglobin: 13.7 g/dL (ref 13.0–17.0)
MCH: 30 pg (ref 26.0–34.0)
MCHC: 32.4 g/dL (ref 30.0–36.0)
MCV: 92.6 fL (ref 80.0–100.0)
Platelets: 117 10*3/uL — ABNORMAL LOW (ref 150–400)
RBC: 4.57 MIL/uL (ref 4.22–5.81)
RDW: 13.2 % (ref 11.5–15.5)
WBC: 5.5 10*3/uL (ref 4.0–10.5)
nRBC: 0 % (ref 0.0–0.2)

## 2022-07-21 LAB — LIPASE, BLOOD: Lipase: 103 U/L — ABNORMAL HIGH (ref 11–51)

## 2022-07-21 LAB — GLUCOSE, CAPILLARY: Glucose-Capillary: 147 mg/dL — ABNORMAL HIGH (ref 70–99)

## 2022-07-21 MED ORDER — ONDANSETRON 8 MG PO TBDP: 8.0000 mg | ORAL_TABLET | Freq: Three times a day (TID) | ORAL | 0 refills | Status: DC | PRN

## 2022-07-21 MED ORDER — TOUJEO MAX SOLOSTAR 300 UNIT/ML ~~LOC~~ SOPN: 15.0000 [IU] | PEN_INJECTOR | Freq: Every day | SUBCUTANEOUS | 3 refills | Status: DC

## 2022-07-21 MED ORDER — OXYCODONE HCL 5 MG PO TABS: 5.0000 mg | ORAL_TABLET | Freq: Four times a day (QID) | ORAL | Status: DC | PRN

## 2022-07-21 MED ORDER — OXYCODONE HCL 5 MG PO TABS: 5.0000 mg | ORAL_TABLET | Freq: Four times a day (QID) | ORAL | 0 refills | Status: DC | PRN

## 2022-07-21 MED ORDER — NOVOLIN R FLEXPEN 100 UNIT/ML IJ SOPN: PEN_INJECTOR | INTRAMUSCULAR | 6 refills | Status: DC

## 2022-07-21 NOTE — Discharge Summary (Signed)
Physician Discharge Summary   Patient: Shaun Odonnell MRN: [id number] DOB: Jun 20, 1966  Admit date:     07/19/2022  Discharge date: 07/21/22  Discharge Physician: Loletha Grayer   PCP: [place]   Recommendations at discharge:   Follow-up with your medical doctor as scheduled. Patient states that he is scheduling with pain management. Follow-up with your endocrinologist 1 week  Discharge Diagnoses: Active Problems:   Acute on chronic pancreatitis ([place])   Uncontrolled type 2 diabetes mellitus with hypoglycemia, with long-term current use of insulin (HCC)   Hypertriglyceridemia   Anxiety and depression   Pancreatic pseudocyst    Hospital Course: Patient was admitted to the hospital on 07/19/2022 and discharged on rate 62,023.  Came in with abdominal pain and acute on chronic pancreatitis.  CT scan showed inflammatory changes surrounding the pancreatic head suspicious for acute on chronic pancreatitis.  Unchanged dilatation of the main pancreatic duct, unchanged cystic lesion in the pancreatic tail measuring 3.3 cm likely pseudocyst.  The patient was feeling better and wanted to advance his diet very quickly.  His lipase trended down from 662 down to 103.  The patient also had hypoglycemia requiring D5 drip and holding his diabetic medications.  The patient will likely go home on 07/21/2022 as he was feeling better and wanted a prescription for pain medications to go home with  Assessment and Plan: Acute on chronic pancreatitis [facility]) - The patient is feeling better.  Was given IV fluids during the hospitalization.  He tolerated advance diet and wanted to go home on oral oxycodone. - The patient has stable pancreatic pseudocyst and dilatation of the pancreatic duct without evidence for obstruction.    Uncontrolled type 2 diabetes mellitus with hypoglycemia, with long-term current use of insulin ([place]) Patient was not given any insulin during the hospital course he was  actually on D5 drip overnight.  The patient has a Dexcom meter and he will follow-up for sugars.  Case discussed with diabetes coordinator and will give 3 units of short acting insulin prior to meals and 15 units of Toujeo insulin daily.  Advised to follow-up with his endocrinologist and adjust insulins.  Hypertriglyceridemia Triglycerides under good control with medications.  Continue medications.  Triglyceride level 21.  Pancreatic pseudocyst Will need outpatient follow-up for this  Anxiety and depression Continue Xanax and Zoloft.         Consultants: None Procedures performed: None Disposition: Home Diet recommendation:  Cardiac and Carb modified diet DISCHARGE MEDICATION: Allergies as of 07/21/2022       Reactions   Fentanyl Nausea And Vomiting   Morphine And Related Shortness Of Breath   Neurontin [gabapentin] Other (See Comments)   Caused severe over sedation   Clonazepam Other (See Comments)   Other reaction(s): "Dystonic"; alprazolam is a home med Dystonic Reaction   Pimozide Other (See Comments)   Other reaction(s): "Distonic" Dystonic Reaction   Trifluoperazine Other (See Comments)   Other (See Comments) "Distonic" Dystonic Reaction   Azithromycin Other (See Comments)   Was told not to take Z pak due to his heart   Montelukast    headache, nausea, feeling a little anxious, panic   Morphine Other (See Comments)   Other reaction(s): " I stop breathing"; can take Diluadid Cnnot tolerate d/t Drug Metabolism   Tramadol Nausea Only        Medication List     STOP taking these medications    acetaminophen 500 MG tablet Commonly known as: TYLENOL   albuterol (  2.5 MG/3ML) 0.083% nebulizer solution Commonly known as: PROVENTIL   albuterol 108 (90 Base) MCG/ACT inhaler Commonly known as: VENTOLIN HFA   budesonide-formoterol 80-4.5 MCG/ACT inhaler Commonly known as: Symbicort   carboxymethylcellulose 0.5 % Soln Commonly known as: REFRESH PLUS    celecoxib 200 MG capsule Commonly known as: CELEBREX   diclofenac Sodium 1 % Gel Commonly known as: Voltaren   empagliflozin 25 MG Tabs tablet Commonly known as: Jardiance   ibuprofen 400 MG tablet Commonly known as: ADVIL   Ketone Test Strp       TAKE these medications    ALPRAZolam 0.5 MG tablet Commonly known as: XANAX Take 0.5 mg by mouth 4 (four) times daily. Patient takes when wakes up(~0600)/ 1000/ 1600/ 2000   Dexcom G6 Sensor Misc 1 Device by Does not apply route See admin instructions. Change every 10 days   Dexcom G6 Transmitter Misc 1 Device by Other route as directed. Every 10 days   Eszopiclone 3 MG Tabs Take 3 mg by mouth at bedtime as needed.   fenofibrate 145 MG tablet Commonly known as: TRICOR Take 1 tablet (145 mg total) by mouth daily.   glucose blood test strip Use as instructed   GNP Insulin Syringes 30G X 5/16" 1 ML Misc Generic drug: Insulin Syringe-Needle U-100 USE AS DIRECTED. WITH BOTH INSULINS 5 TIMES A DAY   NovoLIN R FlexPen ReliOn 100 UNIT/ML KwikPen Generic drug: Insulin Regular Human 3 units with meals What changed: additional instructions   OLANZapine 20 MG tablet Commonly known as: ZYPREXA Take 20 mg by mouth at bedtime.   omeprazole 40 MG capsule Commonly known as: PRILOSEC TAKE 1 CAPSULE BY MOUTH ONCE DAILY   ondansetron 8 MG disintegrating tablet Commonly known as: ZOFRAN-ODT Take 1 tablet (8 mg total) by mouth every 8 (eight) hours as needed for nausea or vomiting.   OneTouch Delica Lancets 76A Misc USE TO CHECK FASTING SUGAR TWICE DAILY   oxyCODONE 5 MG immediate release tablet Commonly known as: Oxy IR/ROXICODONE Take 1 tablet (5 mg total) by mouth every 6 (six) hours as needed for severe pain.   rosuvastatin 40 MG tablet Commonly known as: CRESTOR Take 1 tablet (40 mg total) by mouth daily.   sertraline 100 MG tablet Commonly known as: ZOLOFT Take 100 mg by mouth daily.   Toujeo Max SoloStar 300  UNIT/ML Solostar Pen Generic drug: insulin glargine (2 Unit Dial) Inject 15 Units into the skin daily in the afternoon. What changed: how much to take   Ultracare Pen Needles 32G X 4 MM Misc Generic drug: Insulin Pen Needle Use to inject insulin as needed        Follow-up Information     your medical doctor Follow up in 1 week(s).          your endocrinologist Follow up in 1 week(s).                 Discharge Exam: Filed Weights   07/19/22 0814  Weight: 93.4 kg   Physical Exam HENT:     Head: Normocephalic.     Mouth/Throat:     Pharynx: No oropharyngeal exudate.  Eyes:     General: Lids are normal.     Conjunctiva/sclera: Conjunctivae normal.  Cardiovascular:     Rate and Rhythm: Normal rate and regular rhythm.     Heart sounds: Normal heart sounds, S1 normal and S2 normal.  Pulmonary:     Breath sounds: No decreased breath sounds, wheezing, rhonchi or  rales.  Abdominal:     Tenderness: There is abdominal tenderness in the epigastric area.  Musculoskeletal:     Right lower leg: No swelling.     Left lower leg: No swelling.  Skin:    General: Skin is warm.     Findings: No rash.  Neurological:     Mental Status: He is alert and oriented to person, place, and time.      Condition at discharge: stable  The results of significant diagnostics from this hospitalization (including imaging, microbiology, ancillary and laboratory) are listed below for reference.   Imaging Studies: CT Abdomen Pelvis W Contrast  Result Date: 07/19/2022 CLINICAL DATA:  Upper abdominal pain for 3 days with nausea and vomiting. EXAM: CT ABDOMEN AND PELVIS WITH CONTRAST TECHNIQUE: Multidetector CT imaging of the abdomen and pelvis was performed using the standard protocol following bolus administration of intravenous contrast. RADIATION DOSE REDUCTION: This exam was performed according to the departmental dose-optimization program which includes automated exposure control,  adjustment of the mA and/or kV according to patient size and/or use of iterative reconstruction technique. CONTRAST:  166m OMNIPAQUE IOHEXOL 300 MG/ML  SOLN COMPARISON:  CT abdomen/pelvis 05/22/2022 FINDINGS: Lower chest: The lung bases are clear. The imaged heart is unremarkable. Hepatobiliary: The liver is diffusely hypoattenuating consistent with fatty infiltration. There are no focal lesions. There is no biliary ductal dilatation. Pancreas: There is inflammatory change surrounding the pancreatic head/uncinate process which is new from the prior study. The main pancreatic duct is again dilated measuring up to 7 mm in the region of the head with narrowing of the more distal duct, unchanged. Small calcifications in the pancreatic head are unchanged. The 3.3 cm cystic lesion in the pancreatic tail is unchanged in size or appearance. There is no surrounding inflammatory change. Spleen: Unremarkable. Adrenals/Urinary Tract: The adrenals are unremarkable. There is a punctate nonobstructing left lower pole renal stone (5-62). There are no other stones in either kidney or along the course of either ureter. There are no focal lesions. There is no hydronephrosis or hydroureter. There is symmetric excretion of contrast into the collecting systems on the delayed images. The bladder is unremarkable. Stomach/Bowel: The stomach is unremarkable. There is mucosal hyperemia and mild wall thickening in the duodenum likely related to the adjacent pancreatitis. There is no other abnormal bowel wall thickening or inflammatory change. The appendix is not definitively identified. Vascular/Lymphatic: There is scattered mild calcified plaque in the nonaneurysmal abdominal aorta. Major branch vessels are patent. The main portal and splenic veins are patent. There is no abdominopelvic lymphadenopathy. Reproductive: The prostate and seminal vesicles are unremarkable. Other: There is trace free fluid in the midabdomen likely related to the  pancreatitis. There is no free intraperitoneal air. Musculoskeletal: There is no acute osseous abnormality or suspicious osseous lesion. IMPRESSION: 1. Inflammatory change surrounding the pancreatic head/uncinate process most suspicious for acute on chronic pancreatitis with secondary inflammation of the adjacent duodenum. 2. Unchanged dilation of the main pancreatic duct measuring up to 7 mm with narrowing of the more distal duct. 3. Unchanged cystic lesion in the pancreatic tail measuring up to 3.3 cm, likely a pseudocyst. 4. Fatty infiltration of the liver. 5. Punctate nonobstructing left lower pole renal stone. Electronically Signed   By: PValetta MoleM.D.   On: 07/19/2022 11:20    Microbiology: Results for orders placed or performed during the hospital encounter of 10/08/21  Resp Panel by RT-PCR (Flu A&B, Covid) Nasopharyngeal Swab     Status: None  Collection Time: 10/07/21 10:56 PM   Specimen: Nasopharyngeal Swab; Nasopharyngeal(NP) swabs in vial transport medium  Result Value Ref Range Status   SARS Coronavirus 2 by RT PCR NEGATIVE NEGATIVE Final    Comment: (NOTE) SARS-CoV-2 target nucleic acids are NOT DETECTED.  The SARS-CoV-2 RNA is generally detectable in upper respiratory specimens during the acute phase of infection. The lowest concentration of SARS-CoV-2 viral copies this assay can detect is 138 copies/mL. A negative result does not preclude SARS-Cov-2 infection and should not be used as the sole basis for treatment or other patient management decisions. A negative result may occur with  improper specimen collection/handling, submission of specimen other than nasopharyngeal swab, presence of viral mutation(s) within the areas targeted by this assay, and inadequate number of viral copies(<138 copies/mL). A negative result must be combined with clinical observations, patient history, and epidemiological information. The expected result is Negative.  Fact Sheet for Patients:   EntrepreneurPulse.com.au  Fact Sheet for Healthcare Providers:  IncredibleEmployment.be  This test is no t yet approved or cleared by the [name] FDA and  has been authorized for detection and/or diagnosis of SARS-CoV-2 by FDA under an Emergency Use Authorization (EUA). This EUA will remain  in effect (meaning this test can be used) for the duration of the COVID-19 declaration under Section 564(b)(1) of the Act, 21 U.S.C.section 360bbb-3(b)(1), unless the authorization is terminated  or revoked sooner.       Influenza A by PCR NEGATIVE NEGATIVE Final   Influenza B by PCR NEGATIVE NEGATIVE Final    Comment: (NOTE) The Xpert Xpress SARS-CoV-2/FLU/RSV plus assay is intended as an aid in the diagnosis of influenza from Nasopharyngeal swab specimens and should not be used as a sole basis for treatment. Nasal washings and aspirates are unacceptable for Xpert Xpress SARS-CoV-2/FLU/RSV testing.  Fact Sheet for Patients: EntrepreneurPulse.com.au  Fact Sheet for Healthcare Providers: IncredibleEmployment.be  This test is not yet approved or cleared by the [name] FDA and has been authorized for detection and/or diagnosis of SARS-CoV-2 by FDA under an Emergency Use Authorization (EUA). This EUA will remain in effect (meaning this test can be used) for the duration of the COVID-19 declaration under Section 564(b)(1) of the Act, 21 U.S.C. section 360bbb-3(b)(1), unless the authorization is terminated or revoked.  Performed at [facility], [address] [place], [place] [address]     Labs: CBC: Recent Labs  Lab 07/19/22 0809 07/19/22 1213 07/20/22 0636 07/21/22 0454  WBC 7.0 5.5 5.0 5.5  HGB 15.1 13.5 13.4 13.7  HCT 44.7 41.1 41.7 42.3  MCV 89.8 90.9 92.7 92.6  PLT 138* 107* 126* 361*   Basic Metabolic Panel: Recent Labs  Lab 07/19/22 0809 07/19/22 1213 07/20/22 0636  07/21/22 0454  NA 132*  --  137 138  K 4.0  --  4.1 3.9  CL 98  --  106 104  CO2 21*  --  28 25  GLUCOSE 281*  --  76 162*  BUN 19  --  13 11  CREATININE 0.82 0.58* 0.62 0.72  CALCIUM 9.1  --  8.6* 9.0   Liver Function Tests: Recent Labs  Lab 07/19/22 0809 07/20/22 0636  AST 22 67*  ALT 19 37  ALKPHOS 74 127*  BILITOT 0.7 0.4  PROT 7.9 7.0  ALBUMIN 4.3 3.7   CBG: Recent Labs  Lab 07/20/22 0749 07/20/22 0825 07/20/22 1108 07/20/22 2108 07/21/22 0727  GLUCAP 68* 187* 121* 137* 147*    Discharge time spent:  greater than 30 minutes.  Signed: Loletha Grayer, MD [facility] 07/21/2022

## 2022-07-21 NOTE — Plan of Care (Signed)
Problem: Education: Goal: Knowledge of General Education information will improve Description: Including pain rating scale, medication(s)/side effects and non-pharmacologic comfort measures 07/21/2022 1035 by Alferd Apa, RN Outcome: Adequate for Discharge 07/21/2022 0820 by Alferd Apa, RN Outcome: Progressing   Problem: Health Behavior/Discharge Planning: Goal: Ability to manage health-related needs will improve 07/21/2022 1035 by Alferd Apa, RN Outcome: Adequate for Discharge 07/21/2022 0820 by Alferd Apa, RN Outcome: Progressing   Problem: Clinical Measurements: Goal: Ability to maintain clinical measurements within normal limits will improve 07/21/2022 1035 by Alferd Apa, RN Outcome: Adequate for Discharge 07/21/2022 0820 by Alferd Apa, RN Outcome: Progressing Goal: Will remain free from infection 07/21/2022 1035 by Alferd Apa, RN Outcome: Adequate for Discharge 07/21/2022 0820 by Alferd Apa, RN Outcome: Progressing Goal: Diagnostic test results will improve 07/21/2022 1035 by Alferd Apa, RN Outcome: Adequate for Discharge 07/21/2022 0820 by Alferd Apa, RN Outcome: Progressing Goal: Respiratory complications will improve 07/21/2022 1035 by Alferd Apa, RN Outcome: Adequate for Discharge 07/21/2022 0820 by Alferd Apa, RN Outcome: Progressing Goal: Cardiovascular complication will be avoided 07/21/2022 1035 by Alferd Apa, RN Outcome: Adequate for Discharge 07/21/2022 0820 by Alferd Apa, RN Outcome: Progressing   Problem: Activity: Goal: Risk for activity intolerance will decrease 07/21/2022 1035 by Alferd Apa, RN Outcome: Adequate for Discharge 07/21/2022 0820 by Alferd Apa, RN Outcome: Progressing   Problem: Nutrition: Goal: Adequate nutrition will be maintained 07/21/2022 1035 by Alferd Apa, RN Outcome: Adequate for Discharge 07/21/2022 0820 by Alferd Apa, RN Outcome: Progressing   Problem: Coping: Goal: Level of anxiety  will decrease 07/21/2022 1035 by Alferd Apa, RN Outcome: Adequate for Discharge 07/21/2022 0820 by Alferd Apa, RN Outcome: Progressing   Problem: Elimination: Goal: Will not experience complications related to bowel motility 07/21/2022 1035 by Alferd Apa, RN Outcome: Adequate for Discharge 07/21/2022 0820 by Alferd Apa, RN Outcome: Progressing Goal: Will not experience complications related to urinary retention 07/21/2022 1035 by Alferd Apa, RN Outcome: Adequate for Discharge 07/21/2022 0820 by Alferd Apa, RN Outcome: Progressing   Problem: Pain Managment: Goal: General experience of comfort will improve 07/21/2022 1035 by Alferd Apa, RN Outcome: Adequate for Discharge 07/21/2022 0820 by Alferd Apa, RN Outcome: Progressing   Problem: Safety: Goal: Ability to remain free from injury will improve 07/21/2022 1035 by Alferd Apa, RN Outcome: Adequate for Discharge 07/21/2022 0820 by Alferd Apa, RN Outcome: Progressing   Problem: Skin Integrity: Goal: Risk for impaired skin integrity will decrease 07/21/2022 1035 by Alferd Apa, RN Outcome: Adequate for Discharge 07/21/2022 0820 by Alferd Apa, RN Outcome: Progressing   Problem: Education: Goal: Ability to describe self-care measures that may prevent or decrease complications (Diabetes Survival Skills Education) will improve 07/21/2022 1035 by Alferd Apa, RN Outcome: Adequate for Discharge 07/21/2022 0820 by Alferd Apa, RN Outcome: Progressing Goal: Individualized Educational Video(s) 07/21/2022 1035 by Alferd Apa, RN Outcome: Adequate for Discharge 07/21/2022 0820 by Alferd Apa, RN Outcome: Progressing   Problem: Coping: Goal: Ability to adjust to condition or change in health will improve 07/21/2022 1035 by Alferd Apa, RN Outcome: Adequate for Discharge 07/21/2022 0820 by Alferd Apa, RN Outcome: Progressing   Problem: Fluid Volume: Goal: Ability to maintain a balanced intake and  output will improve 07/21/2022 1035 by Alferd Apa, RN Outcome: Adequate for Discharge 07/21/2022 0820 by Vertis Kelch,  Alfredo Batty, RN Outcome: Progressing   Problem: Health Behavior/Discharge Planning: Goal: Ability to identify and utilize available resources and services will improve 07/21/2022 1035 by Abdulkarim Eberlin, Alfredo Batty, RN Outcome: Adequate for Discharge 07/21/2022 0820 by Alferd Apa, RN Outcome: Progressing Goal: Ability to manage health-related needs will improve 07/21/2022 1035 by Alferd Apa, RN Outcome: Adequate for Discharge 07/21/2022 0820 by Alferd Apa, RN Outcome: Progressing   Problem: Metabolic: Goal: Ability to maintain appropriate glucose levels will improve 07/21/2022 1035 by Alferd Apa, RN Outcome: Adequate for Discharge 07/21/2022 0820 by Alferd Apa, RN Outcome: Progressing   Problem: Nutritional: Goal: Maintenance of adequate nutrition will improve 07/21/2022 1035 by Alferd Apa, RN Outcome: Adequate for Discharge 07/21/2022 0820 by Alferd Apa, RN Outcome: Progressing Goal: Progress toward achieving an optimal weight will improve 07/21/2022 1035 by Alferd Apa, RN Outcome: Adequate for Discharge 07/21/2022 0820 by Alferd Apa, RN Outcome: Progressing   Problem: Skin Integrity: Goal: Risk for impaired skin integrity will decrease 07/21/2022 1035 by Alferd Apa, RN Outcome: Adequate for Discharge 07/21/2022 0820 by Alferd Apa, RN Outcome: Progressing   Problem: Tissue Perfusion: Goal: Adequacy of tissue perfusion will improve 07/21/2022 1035 by Alferd Apa, RN Outcome: Adequate for Discharge 07/21/2022 0820 by Alferd Apa, RN Outcome: Progressing

## 2022-07-21 NOTE — Plan of Care (Signed)

## 2022-07-21 NOTE — Inpatient Diabetes Management (Signed)
Inpatient Diabetes Program Recommendations  AACE/ADA: New Consensus Statement on Inpatient Glycemic Control (2015)  Target Ranges:  Prepandial:   less than 140 mg/dL      Peak postprandial:   less than 180 mg/dL (1-2 hours)      Critically ill patients:  140 - 180 mg/dL   Lab Results  Component Value Date   GLUCAP 147 (H) 07/21/2022   HGBA1C 7.9 (A) 05/31/2022    Review of Glycemic Control  Diabetes history: DM2 Outpatient Diabetes medications: Toujeo 60 units, Novolin R 10 units tid ac meals + correction scale  Inpatient Diabetes Program Recommendations:   Received page from Dr. Leslye Peer and discussed discharge insulin doses. Agree with plan to D/C on Toujeo 15 units qd, Novolin R 3 units tid meal coverage, Jardiance 25 Spoke with pt. Via phone (DM coordinator working remotely on weekend call) Patient agrees to send message via portal to Dr. Kelton Pillar and call for earlier appointment than scheduled 10-23-22.  Patient discussed his dosing and understands his insulin requirements are less due to eating less with dx pancreatitis. Discussed if fasting CBG <90, notify Dr. Kelton Pillar for possible decrease in basal. Reviewed with patient to not take Jardiance if nausea and vomiting and call his doctor. Patient has a dexcom sensor and takes results to endocrinologist appointments. Patient states understanding.  Thank you, Nani Gasser. Lamont Tant, RN, MSN, CDE  Diabetes Coordinator Inpatient Glycemic Control Team Team Pager [phone] (8am-5pm) 07/21/2022 10:31 AM

## 2022-07-22 ENCOUNTER — Emergency Department
Admission: EM | Admit: 2022-07-22 | Discharge: 2022-07-22 | Disposition: A | Discharge status: Home / Self Care | Payer: Medicare Other | Attending: Emergency Medicine | Admitting: Emergency Medicine

## 2022-07-22 ENCOUNTER — Other Ambulatory Visit: Payer: Self-pay

## 2022-07-22 DIAGNOSIS — N189 Chronic kidney disease, unspecified: Secondary | ICD-10-CM | POA: Diagnosis not present

## 2022-07-22 DIAGNOSIS — J449 Chronic obstructive pulmonary disease, unspecified: Secondary | ICD-10-CM | POA: Diagnosis not present

## 2022-07-22 DIAGNOSIS — K861 Other chronic pancreatitis: Secondary | ICD-10-CM | POA: Diagnosis not present

## 2022-07-22 DIAGNOSIS — E1122 Type 2 diabetes mellitus with diabetic chronic kidney disease: Secondary | ICD-10-CM | POA: Diagnosis not present

## 2022-07-22 DIAGNOSIS — I10 Essential (primary) hypertension: Secondary | ICD-10-CM | POA: Diagnosis not present

## 2022-07-22 DIAGNOSIS — R1013 Epigastric pain: Secondary | ICD-10-CM | POA: Diagnosis not present

## 2022-07-22 LAB — COMPREHENSIVE METABOLIC PANEL
ALT: 35 U/L (ref 0–44)
AST: 25 U/L (ref 15–41)
Albumin: 4.5 g/dL (ref 3.5–5.0)
Alkaline Phosphatase: 153 U/L — ABNORMAL HIGH (ref 38–126)
Anion gap: 13 (ref 5–15)
BUN: 17 mg/dL (ref 6–20)
CO2: 23 mmol/L (ref 22–32)
Calcium: 9.4 mg/dL (ref 8.9–10.3)
Chloride: 97 mmol/L — ABNORMAL LOW (ref 98–111)
Creatinine, Ser: 0.78 mg/dL (ref 0.61–1.24)
GFR, Estimated: >60 mL/min (ref >60)
Glucose, Bld: 163 mg/dL — ABNORMAL HIGH (ref 70–99)
Potassium: 4.1 mmol/L (ref 3.5–5.1)
Sodium: 133 mmol/L — ABNORMAL LOW (ref 135–145)
Total Bilirubin: 0.8 mg/dL (ref 0.3–1.2)
Total Protein: 9.1 g/dL — ABNORMAL HIGH (ref 6.5–8.1)

## 2022-07-22 LAB — CBC
HCT: 48.3 % (ref 39.0–52.0)
Hemoglobin: 15.6 g/dL (ref 13.0–17.0)
MCH: 29.4 pg (ref 26.0–34.0)
MCHC: 32.3 g/dL (ref 30.0–36.0)
MCV: 91 fL (ref 80.0–100.0)
Platelets: 191 10*3/uL (ref 150–400)
RBC: 5.31 MIL/uL (ref 4.22–5.81)
RDW: 13.2 % (ref 11.5–15.5)
WBC: 9.1 10*3/uL (ref 4.0–10.5)
nRBC: 0 % (ref 0.0–0.2)

## 2022-07-22 LAB — LIPASE, BLOOD: Lipase: 49 U/L (ref 11–51)

## 2022-07-22 MED ORDER — ONDANSETRON HCL 4 MG/2ML IJ SOLN
4.0000 mg | Freq: Once | INTRAMUSCULAR | Status: AC
  Administered 2022-07-22: 4 mg via INTRAVENOUS
  Filled 2022-07-22: qty 2

## 2022-07-22 MED ORDER — OXYCODONE-ACETAMINOPHEN 5-325 MG PO TABS
1.0000 | ORAL_TABLET | Freq: Once | ORAL | Status: AC
  Administered 2022-07-22: 1 via ORAL
  Filled 2022-07-22: qty 1

## 2022-07-22 MED ORDER — HYDROMORPHONE HCL 1 MG/ML IJ SOLN
1.0000 mg | Freq: Once | INTRAMUSCULAR | Status: AC
  Administered 2022-07-22: 1 mg via INTRAVENOUS
  Filled 2022-07-22: qty 1

## 2022-07-22 MED ORDER — LACTATED RINGERS IV BOLUS
1000.0000 mL | Freq: Once | INTRAVENOUS | Status: AC
  Administered 2022-07-22: 1000 mL via INTRAVENOUS

## 2022-07-22 NOTE — ED Triage Notes (Signed)
Pt with recent hospitalization for pancreatitis. Pt was dcd yesterday and pain started recurring on the way home. Pt states he started vomiting again this am. He states he has been trying to increase fluids and take pain meds but pain meds aren't helping. Pt states he wants to be admitted back in the hospital for the pain and to get fluids. Pt states the tylenol and ibuprofen are hurting his stomach. Pt states pain is across upper abdomen.

## 2022-07-22 NOTE — ED Provider Notes (Signed)
[facility] Provider Note    Event Date/Time   First MD Initiated Contact with Patient 07/22/22 1505     (approximate)   History   Chief Complaint Abdominal Pain   HPI  Shaun Odonnell is a 56 y.o. male with past medical history of hyperlipidemia, diabetes, COPD, PE, CKD, chronic pancreatitis, and schizoaffective disorder who presents to the ED complaining of abdominal pain.  Patient reports that he was just admitted to the hospital for acute on chronic pancreatitis, was discharged home yesterday when he states he was feeling better.  He states that he went back to eating solid foods, and is concerned that he may have done so too soon.  He states that today he has been dealing with increasing pain primarily in his epigastric area with nausea and multiple episodes of vomiting.  He denies any fevers, has not had any changes in his bowel movements, and denies any dysuria, hematuria, or flank pain.  He has not had any chest pain or shortness of breath.  He states he has been taking prescribed pain and nausea medication at home without significant relief.     Physical Exam   Triage Vital Signs: ED Triage Vitals  Enc Vitals Group     BP 07/22/22 1309 120/88     Pulse Rate 07/22/22 1309 (!) 103     Resp 07/22/22 1309 18     Temp 07/22/22 1309 98.5 F (36.9 C)     Temp Source 07/22/22 1309 Oral     SpO2 07/22/22 1309 95 %     Weight 07/22/22 1310 206 lb (93.4 kg)     Height 07/22/22 1310 '5\' 8"'$  (1.727 m)     Head Circumference --      Peak Flow --      Pain Score 07/22/22 1309 8     Pain Loc --      Pain Edu? --      Excl. in [place]? --     Most recent vital signs: Vitals:   07/22/22 1709 07/22/22 1852  BP:  124/86  Pulse:  88  Resp:  17  Temp: 98.4 F (36.9 C) 98.2 F (36.8 C)  SpO2:  98%    Constitutional: Alert and oriented. Eyes: Conjunctivae are normal. Head: Atraumatic. Nose: No congestion/rhinnorhea. Mouth/Throat: Mucous membranes are  moist.  Cardiovascular: Normal rate, regular rhythm. Grossly normal heart sounds.  2+ radial pulses bilaterally. Respiratory: Normal respiratory effort.  No retractions. Lungs CTAB. Gastrointestinal: Soft and tender to palpation in the epigastrium with no rebound or guarding. No distention. Musculoskeletal: No lower extremity tenderness nor edema.  Neurologic:  Normal speech and language. No gross focal neurologic deficits are appreciated.    ED Results / Procedures / Treatments   Labs (all labs ordered are listed, but only abnormal results are displayed) Labs Reviewed  COMPREHENSIVE METABOLIC PANEL - Abnormal; Notable for the following components:      Result Value   Sodium 133 (*)    Chloride 97 (*)    Glucose, Bld 163 (*)    Total Protein 9.1 (*)    Alkaline Phosphatase 153 (*)    All other components within normal limits  LIPASE, BLOOD  CBC     EKG  ED ECG REPORT I, Blake Divine, the attending physician, personally viewed and interpreted this ECG.   Date: 07/22/2022  EKG Time: 13:13  Rate: 98  Rhythm: normal sinus rhythm  Axis: Normal  Intervals:none  ST&T Change: None  PROCEDURES:  Critical Care performed: No  Procedures   MEDICATIONS ORDERED IN ED: Medications  oxyCODONE-acetaminophen (PERCOCET/ROXICET) 5-325 MG per tablet 1 tablet (1 tablet Oral Given 07/22/22 1548)  HYDROmorphone (DILAUDID) injection 1 mg (1 mg Intravenous Given 07/22/22 1638)  ondansetron (ZOFRAN) injection 4 mg (4 mg Intravenous Given 07/22/22 1637)  lactated ringers bolus 1,000 mL (0 mLs Intravenous Stopped 07/22/22 1852)     IMPRESSION / MDM / ASSESSMENT AND PLAN / ED COURSE  I reviewed the triage vital signs and the nursing notes.                              56 y.o. male with past medical history of hyperlipidemia, diabetes, CKD, PE, COPD, chronic pancreatitis, and schizoaffective disorder who presents to the ED with worsening pain in his epigastrium along with nausea and  vomiting after recently being admitted to the hospital for acute on chronic pancreatitis.  Patient's presentation is most consistent with acute presentation with potential threat to life or bodily function.  Differential diagnosis includes, but is not limited to, acute pancreatitis, chronic pancreatitis, gastritis, cholecystitis, biliary colic, hepatitis, bowel obstruction, electrolyte abnormality, AKI.  Patient nontoxic-appearing and in no acute distress, vital signs are unremarkable.  Pain is reproducible with palpation of his epigastric area and seems consistent with ongoing acute on chronic pancreatitis for his she was discharged from the hospital yesterday.  Labs are reassuring with no significant electrolyte abnormality or AKI, LFTs are unremarkable and his lipase appears to have normalized.  He has no significant anemia or leukocytosis, do not feel repeat CT imaging of his abdomen needed today as recent CT was consistent with pancreatitis along with unchanged pseudocyst.  Patient was given IV Dilaudid for pain as well as IV Phenergan for nausea, now reports he is feeling better and is tolerating oral intake without difficulty.  He is requesting to be discharged home, which is reasonable given reassuring work-up.  He was counseled to return to the ED for new or worsening symptoms, otherwise follow-up with his PCP.  Patient agrees with plan.      FINAL CLINICAL IMPRESSION(S) / ED DIAGNOSES   Final diagnoses:  Chronic pancreatitis, unspecified pancreatitis type ([place])     Rx / DC Orders   ED Discharge Orders     None        Note:  This document was prepared using Dragon voice recognition software and may include unintentional dictation errors.   Blake Divine, MD 07/22/22 2350

## 2022-07-24 ENCOUNTER — Encounter: Payer: Self-pay | Admitting: Internal Medicine

## 2022-07-24 NOTE — Telephone Encounter (Signed)
Patient has been scheduled for 07/26/22

## 2022-07-26 ENCOUNTER — Emergency Department: Payer: Medicare Other

## 2022-07-26 ENCOUNTER — Encounter: Payer: Self-pay | Admitting: Emergency Medicine

## 2022-07-26 ENCOUNTER — Inpatient Hospital Stay
Admission: EM | Admit: 2022-07-26 | Discharge: 2022-07-30 | DRG: 439 | Discharge status: Against Medical Advice | Payer: Medicare Other | Attending: Internal Medicine | Admitting: Internal Medicine

## 2022-07-26 ENCOUNTER — Other Ambulatory Visit: Payer: Self-pay

## 2022-07-26 ENCOUNTER — Ambulatory Visit: Payer: Medicare Other | Admitting: Internal Medicine

## 2022-07-26 DIAGNOSIS — R1110 Vomiting, unspecified: Secondary | ICD-10-CM | POA: Diagnosis not present

## 2022-07-26 DIAGNOSIS — E66811 Obesity, class 1: Secondary | ICD-10-CM

## 2022-07-26 DIAGNOSIS — J449 Chronic obstructive pulmonary disease, unspecified: Secondary | ICD-10-CM | POA: Diagnosis present

## 2022-07-26 DIAGNOSIS — F1010 Alcohol abuse, uncomplicated: Secondary | ICD-10-CM | POA: Diagnosis present

## 2022-07-26 DIAGNOSIS — Z8639 Personal history of other endocrine, nutritional and metabolic disease: Secondary | ICD-10-CM | POA: Diagnosis not present

## 2022-07-26 DIAGNOSIS — Z823 Family history of stroke: Secondary | ICD-10-CM | POA: Diagnosis not present

## 2022-07-26 DIAGNOSIS — E669 Obesity, unspecified: Secondary | ICD-10-CM | POA: Diagnosis not present

## 2022-07-26 DIAGNOSIS — Z833 Family history of diabetes mellitus: Secondary | ICD-10-CM | POA: Diagnosis not present

## 2022-07-26 DIAGNOSIS — K760 Fatty (change of) liver, not elsewhere classified: Secondary | ICD-10-CM | POA: Diagnosis present

## 2022-07-26 DIAGNOSIS — Z8249 Family history of ischemic heart disease and other diseases of the circulatory system: Secondary | ICD-10-CM | POA: Diagnosis not present

## 2022-07-26 DIAGNOSIS — Z961 Presence of intraocular lens: Secondary | ICD-10-CM | POA: Diagnosis not present

## 2022-07-26 DIAGNOSIS — K2100 Gastro-esophageal reflux disease with esophagitis, without bleeding: Secondary | ICD-10-CM | POA: Diagnosis present

## 2022-07-26 DIAGNOSIS — F3130 Bipolar disorder, current episode depressed, mild or moderate severity, unspecified: Secondary | ICD-10-CM | POA: Diagnosis present

## 2022-07-26 DIAGNOSIS — N189 Chronic kidney disease, unspecified: Secondary | ICD-10-CM | POA: Diagnosis not present

## 2022-07-26 DIAGNOSIS — E785 Hyperlipidemia, unspecified: Secondary | ICD-10-CM | POA: Diagnosis not present

## 2022-07-26 DIAGNOSIS — Z794 Long term (current) use of insulin: Secondary | ICD-10-CM

## 2022-07-26 DIAGNOSIS — F419 Anxiety disorder, unspecified: Secondary | ICD-10-CM | POA: Diagnosis present

## 2022-07-26 DIAGNOSIS — E1169 Type 2 diabetes mellitus with other specified complication: Secondary | ICD-10-CM | POA: Diagnosis present

## 2022-07-26 DIAGNOSIS — E119 Type 2 diabetes mellitus without complications: Secondary | ICD-10-CM

## 2022-07-26 DIAGNOSIS — Z87891 Personal history of nicotine dependence: Secondary | ICD-10-CM

## 2022-07-26 DIAGNOSIS — F319 Bipolar disorder, unspecified: Secondary | ICD-10-CM | POA: Diagnosis present

## 2022-07-26 DIAGNOSIS — K861 Other chronic pancreatitis: Secondary | ICD-10-CM | POA: Diagnosis present

## 2022-07-26 DIAGNOSIS — I10 Essential (primary) hypertension: Secondary | ICD-10-CM | POA: Diagnosis present

## 2022-07-26 DIAGNOSIS — E871 Hypo-osmolality and hyponatremia: Secondary | ICD-10-CM

## 2022-07-26 DIAGNOSIS — I129 Hypertensive chronic kidney disease with stage 1 through stage 4 chronic kidney disease, or unspecified chronic kidney disease: Secondary | ICD-10-CM | POA: Diagnosis not present

## 2022-07-26 DIAGNOSIS — E7800 Pure hypercholesterolemia, unspecified: Secondary | ICD-10-CM | POA: Diagnosis present

## 2022-07-26 DIAGNOSIS — R109 Unspecified abdominal pain: Secondary | ICD-10-CM | POA: Diagnosis not present

## 2022-07-26 DIAGNOSIS — Z9842 Cataract extraction status, left eye: Secondary | ICD-10-CM | POA: Diagnosis not present

## 2022-07-26 DIAGNOSIS — I700 Atherosclerosis of aorta: Secondary | ICD-10-CM | POA: Diagnosis not present

## 2022-07-26 DIAGNOSIS — Z83438 Family history of other disorder of lipoprotein metabolism and other lipidemia: Secondary | ICD-10-CM | POA: Diagnosis not present

## 2022-07-26 DIAGNOSIS — F32A Depression, unspecified: Secondary | ICD-10-CM | POA: Diagnosis present

## 2022-07-26 DIAGNOSIS — R1010 Upper abdominal pain, unspecified: Secondary | ICD-10-CM | POA: Diagnosis present

## 2022-07-26 DIAGNOSIS — Z79899 Other long term (current) drug therapy: Secondary | ICD-10-CM

## 2022-07-26 DIAGNOSIS — E11649 Type 2 diabetes mellitus with hypoglycemia without coma: Secondary | ICD-10-CM | POA: Diagnosis not present

## 2022-07-26 DIAGNOSIS — Z8709 Personal history of other diseases of the respiratory system: Secondary | ICD-10-CM

## 2022-07-26 DIAGNOSIS — Z9841 Cataract extraction status, right eye: Secondary | ICD-10-CM

## 2022-07-26 DIAGNOSIS — R110 Nausea: Secondary | ICD-10-CM | POA: Diagnosis not present

## 2022-07-26 DIAGNOSIS — K5900 Constipation, unspecified: Secondary | ICD-10-CM | POA: Diagnosis present

## 2022-07-26 DIAGNOSIS — K863 Pseudocyst of pancreas: Secondary | ICD-10-CM | POA: Diagnosis not present

## 2022-07-26 DIAGNOSIS — Z885 Allergy status to narcotic agent status: Secondary | ICD-10-CM

## 2022-07-26 DIAGNOSIS — K859 Acute pancreatitis without necrosis or infection, unspecified: Principal | ICD-10-CM | POA: Diagnosis present

## 2022-07-26 DIAGNOSIS — Z888 Allergy status to other drugs, medicaments and biological substances status: Secondary | ICD-10-CM

## 2022-07-26 DIAGNOSIS — Z5321 Procedure and treatment not carried out due to patient leaving prior to being seen by health care provider: Secondary | ICD-10-CM | POA: Diagnosis not present

## 2022-07-26 LAB — URINALYSIS, ROUTINE W REFLEX MICROSCOPIC
Bilirubin Urine: NEGATIVE
Glucose, UA: >1000 mg/dL — AB
Ketones, ur: 15 mg/dL — AB
Leukocytes,Ua: NEGATIVE
Nitrite: NEGATIVE
Protein, ur: NEGATIVE mg/dL
Specific Gravity, Urine: 1.015 (ref 1.005–1.030)
pH: 6.5 (ref 5.0–8.0)

## 2022-07-26 LAB — CBC
HCT: 46.3 % (ref 39.0–52.0)
Hemoglobin: 15 g/dL (ref 13.0–17.0)
MCH: 29.9 pg (ref 26.0–34.0)
MCHC: 32.4 g/dL (ref 30.0–36.0)
MCV: 92.2 fL (ref 80.0–100.0)
Platelets: 248 10*3/uL (ref 150–400)
RBC: 5.02 MIL/uL (ref 4.22–5.81)
RDW: 13.2 % (ref 11.5–15.5)
WBC: 10.1 10*3/uL (ref 4.0–10.5)
nRBC: 0 % (ref 0.0–0.2)

## 2022-07-26 LAB — COMPREHENSIVE METABOLIC PANEL
ALT: 22 U/L (ref 0–44)
AST: 17 U/L (ref 15–41)
Albumin: 4.1 g/dL (ref 3.5–5.0)
Alkaline Phosphatase: 110 U/L (ref 38–126)
Anion gap: 11 (ref 5–15)
BUN: 14 mg/dL (ref 6–20)
CO2: 26 mmol/L (ref 22–32)
Calcium: 9.3 mg/dL (ref 8.9–10.3)
Chloride: 101 mmol/L (ref 98–111)
Creatinine, Ser: 0.82 mg/dL (ref 0.61–1.24)
GFR, Estimated: >60 mL/min (ref >60)
Glucose, Bld: 221 mg/dL — ABNORMAL HIGH (ref 70–99)
Potassium: 3.7 mmol/L (ref 3.5–5.1)
Sodium: 138 mmol/L (ref 135–145)
Total Bilirubin: 0.5 mg/dL (ref 0.3–1.2)
Total Protein: 8.1 g/dL (ref 6.5–8.1)

## 2022-07-26 LAB — GLUCOSE, CAPILLARY
Glucose-Capillary: 60 mg/dL — ABNORMAL LOW (ref 70–99)
Glucose-Capillary: 117 mg/dL — ABNORMAL HIGH (ref 70–99)

## 2022-07-26 LAB — PHOSPHORUS: Phosphorus: 3.2 mg/dL (ref 2.5–4.6)

## 2022-07-26 LAB — CBG MONITORING, ED
Glucose-Capillary: 118 mg/dL — ABNORMAL HIGH (ref 70–99)
Glucose-Capillary: 89 mg/dL (ref 70–99)

## 2022-07-26 LAB — MAGNESIUM: Magnesium: 2 mg/dL (ref 1.7–2.4)

## 2022-07-26 LAB — LIPASE, BLOOD: Lipase: 39 U/L (ref 11–51)

## 2022-07-26 MED ORDER — SODIUM CHLORIDE 0.9 % IV SOLN: INTRAVENOUS | Status: DC

## 2022-07-26 MED ORDER — ONDANSETRON HCL 4 MG/2ML IJ SOLN
4.0000 mg | Freq: Four times a day (QID) | INTRAMUSCULAR | Status: DC | PRN
  Administered 2022-07-29: 4 mg via INTRAVENOUS
  Filled 2022-07-26: qty 2

## 2022-07-26 MED ORDER — KETOROLAC TROMETHAMINE 15 MG/ML IJ SOLN
15.0000 mg | Freq: Once | INTRAMUSCULAR | Status: AC
  Administered 2022-07-26: 15 mg via INTRAVENOUS
  Filled 2022-07-26: qty 1

## 2022-07-26 MED ORDER — ACETAMINOPHEN 325 MG PO TABS
650.0000 mg | ORAL_TABLET | Freq: Four times a day (QID) | ORAL | Status: DC | PRN
  Filled 2022-07-26: qty 2

## 2022-07-26 MED ORDER — INSULIN ASPART 100 UNIT/ML IJ SOLN
0.0000 [IU] | Freq: Three times a day (TID) | INTRAMUSCULAR | Status: DC
  Administered 2022-07-27: 3 [IU] via SUBCUTANEOUS
  Administered 2022-07-27 – 2022-07-28 (×2): 1 [IU] via SUBCUTANEOUS
  Administered 2022-07-28: 5 [IU] via SUBCUTANEOUS
  Administered 2022-07-29 (×2): 2 [IU] via SUBCUTANEOUS
  Filled 2022-07-26 (×7): qty 1

## 2022-07-26 MED ORDER — HEPARIN SODIUM (PORCINE) 5000 UNIT/ML IJ SOLN
5000.0000 [IU] | Freq: Three times a day (TID) | INTRAMUSCULAR | Status: DC
  Administered 2022-07-26 – 2022-07-29 (×11): 5000 [IU] via SUBCUTANEOUS
  Filled 2022-07-26 (×11): qty 1

## 2022-07-26 MED ORDER — ONDANSETRON HCL 4 MG/2ML IJ SOLN
4.0000 mg | Freq: Once | INTRAMUSCULAR | Status: AC
  Administered 2022-07-26: 4 mg via INTRAVENOUS
  Filled 2022-07-26: qty 2

## 2022-07-26 MED ORDER — IOHEXOL 300 MG/ML  SOLN
100.0000 mL | Freq: Once | INTRAMUSCULAR | Status: AC | PRN
  Administered 2022-07-26: 100 mL via INTRAVENOUS

## 2022-07-26 MED ORDER — ROSUVASTATIN CALCIUM 20 MG PO TABS
40.0000 mg | ORAL_TABLET | Freq: Every evening | ORAL | Status: DC
  Administered 2022-07-26 – 2022-07-29 (×4): 40 mg via ORAL
  Filled 2022-07-26 (×5): qty 2

## 2022-07-26 MED ORDER — HYDROMORPHONE HCL 1 MG/ML IJ SOLN
1.0000 mg | Freq: Once | INTRAMUSCULAR | Status: AC
  Administered 2022-07-26: 1 mg via INTRAVENOUS
  Filled 2022-07-26: qty 1

## 2022-07-26 MED ORDER — OLANZAPINE 10 MG PO TABS
20.0000 mg | ORAL_TABLET | Freq: Every day | ORAL | Status: DC
  Administered 2022-07-26 – 2022-07-29 (×4): 20 mg via ORAL
  Filled 2022-07-26 (×5): qty 2

## 2022-07-26 MED ORDER — DEXTROSE 50 % IV SOLN
INTRAVENOUS | Status: AC
  Administered 2022-07-26: 12.5 g via INTRAVENOUS
  Filled 2022-07-26: qty 50

## 2022-07-26 MED ORDER — OXYCODONE HCL 5 MG PO TABS
5.0000 mg | ORAL_TABLET | ORAL | Status: DC | PRN
  Administered 2022-07-26 – 2022-07-27 (×3): 5 mg via ORAL
  Filled 2022-07-26 (×5): qty 1

## 2022-07-26 MED ORDER — ALPRAZOLAM 0.5 MG PO TABS
0.5000 mg | ORAL_TABLET | Freq: Three times a day (TID) | ORAL | Status: DC | PRN
  Administered 2022-07-26 – 2022-07-30 (×8): 0.5 mg via ORAL
  Filled 2022-07-26 (×9): qty 1

## 2022-07-26 MED ORDER — PANTOPRAZOLE SODIUM 40 MG PO TBEC
40.0000 mg | DELAYED_RELEASE_TABLET | Freq: Two times a day (BID) | ORAL | Status: DC
  Administered 2022-07-26 – 2022-07-29 (×7): 40 mg via ORAL
  Filled 2022-07-26 (×7): qty 1

## 2022-07-26 MED ORDER — SODIUM CHLORIDE 0.9 % IV BOLUS
1000.0000 mL | Freq: Once | INTRAVENOUS | Status: AC
  Administered 2022-07-26: 1000 mL via INTRAVENOUS

## 2022-07-26 MED ORDER — PANTOPRAZOLE SODIUM 40 MG IV SOLR
40.0000 mg | Freq: Once | INTRAVENOUS | Status: AC
  Administered 2022-07-26: 40 mg via INTRAVENOUS
  Filled 2022-07-26: qty 10

## 2022-07-26 MED ORDER — INSULIN GLARGINE-YFGN 100 UNIT/ML ~~LOC~~ SOLN
4.0000 [IU] | Freq: Every day | SUBCUTANEOUS | Status: DC
  Administered 2022-07-27 – 2022-07-29 (×3): 4 [IU] via SUBCUTANEOUS
  Filled 2022-07-26 (×4): qty 0.04

## 2022-07-26 MED ORDER — ALBUTEROL SULFATE (2.5 MG/3ML) 0.083% IN NEBU: 3.0000 mL | INHALATION_SOLUTION | Freq: Four times a day (QID) | RESPIRATORY_TRACT | Status: DC | PRN

## 2022-07-26 MED ORDER — DEXTROSE 50 % IV SOLN: 12.5000 g | INTRAVENOUS | Status: AC

## 2022-07-26 MED ORDER — PANCRELIPASE (LIP-PROT-AMYL) 12000-38000 UNITS PO CPEP
24000.0000 [IU] | ORAL_CAPSULE | Freq: Three times a day (TID) | ORAL | Status: DC
  Administered 2022-07-26 – 2022-07-29 (×12): 24000 [IU] via ORAL
  Filled 2022-07-26 (×13): qty 2

## 2022-07-26 MED ORDER — SERTRALINE HCL 50 MG PO TABS
100.0000 mg | ORAL_TABLET | Freq: Every day | ORAL | Status: DC
  Administered 2022-07-27 – 2022-07-29 (×3): 100 mg via ORAL
  Filled 2022-07-26 (×3): qty 2

## 2022-07-26 MED ORDER — ONDANSETRON HCL 4 MG PO TABS
4.0000 mg | ORAL_TABLET | Freq: Four times a day (QID) | ORAL | Status: DC | PRN
  Administered 2022-07-28: 4 mg via ORAL
  Filled 2022-07-26: qty 1

## 2022-07-26 MED ORDER — ACETAMINOPHEN 650 MG RE SUPP: 650.0000 mg | Freq: Four times a day (QID) | RECTAL | Status: DC | PRN

## 2022-07-26 MED ORDER — HYDROMORPHONE HCL 1 MG/ML IJ SOLN
1.0000 mg | INTRAMUSCULAR | Status: DC | PRN
  Administered 2022-07-26 – 2022-07-29 (×21): 1 mg via INTRAVENOUS
  Filled 2022-07-26 (×21): qty 1

## 2022-07-26 NOTE — Assessment & Plan Note (Signed)
Continue PPI ?

## 2022-07-26 NOTE — Plan of Care (Signed)

## 2022-07-26 NOTE — ED Provider Notes (Signed)
[facility] Provider Note    Event Date/Time   First MD Initiated Contact with Patient 07/26/22 0815     (approximate)   History   Chief Complaint: Abdominal Pain   HPI  Shaun Odonnell is a 56 y.o. male with a history of alcoholic pancreatitis, hypertension, diabetes, pancreatic pseudocyst, bipolar disorder who comes the ED complaining of abdominal pain.  He describes it as generalized, worse in the upper abdomen and on the right side of the abdomen.  Been worsening for the last few days.  Notably, the patient was recently hospitalized for acute on chronic pancreatitis, discharged on September 16 5 days ago, seen again on the emergency department on September 17 4 days ago.  He reports that today he is having vomiting and unable to tolerate oral intake with any food or fluids.  Denies fever chest pain shortness of breath body aches or cough.     Physical Exam   Triage Vital Signs: ED Triage Vitals  Enc Vitals Group     BP 07/26/22 0751 (!) 171/91     Pulse Rate 07/26/22 0751 95     Resp 07/26/22 0751 18     Temp 07/26/22 0751 97.7 F (36.5 C)     Temp src --      SpO2 07/26/22 0751 97 %     Weight 07/26/22 0753 206 lb (93.4 kg)     Height 07/26/22 0753 '5\' 8"'$  (1.727 m)     Head Circumference --      Peak Flow --      Pain Score 07/26/22 0753 9     Pain Loc --      Pain Edu? --      Excl. in [place]? --     Most recent vital signs: Vitals:   07/26/22 0930 07/26/22 1030  BP: (!) 156/95 129/83  Pulse: 73 63  Resp: 18 18  Temp:    SpO2: 100% 95%    General: Awake, no distress.  Nontoxic CV:  Good peripheral perfusion.  Regular rate and rhythm Resp:  Normal effort.  Clear to auscultation bilaterally Abd:  No distention.  Soft, truncal obesity.  No peritoneal signs.  There is diffuse tenderness without focality. Other:  No lower extremity edema.  Moist oral mucosa.   ED Results / Procedures / Treatments   Labs (all labs ordered are listed,  but only abnormal results are displayed) Labs Reviewed  COMPREHENSIVE METABOLIC PANEL - Abnormal; Notable for the following components:      Result Value   Glucose, Bld 221 (*)    All other components within normal limits  URINALYSIS, ROUTINE W REFLEX MICROSCOPIC - Abnormal; Notable for the following components:   Color, Urine YELLOW (*)    Glucose, UA >1,000 (*)    Hgb urine dipstick TRACE (*)    Ketones, ur 15 (*)    Bacteria, UA RARE (*)    All other components within normal limits  LIPASE, BLOOD  CBC     EKG    RADIOLOGY CT abdomen pelvis interpreted by me, shows pancreatic inflammation with pseudocyst.  No free air, no large abscess.  No bowel obstruction.  Radiology report reviewed.   PROCEDURES:  Procedures   MEDICATIONS ORDERED IN ED: Medications  sodium chloride 0.9 % bolus 1,000 mL (1,000 mLs Intravenous New Bag/Given 07/26/22 0912)  ondansetron (ZOFRAN) injection 4 mg (4 mg Intravenous Given 07/26/22 0906)  ketorolac (TORADOL) 15 MG/ML injection 15 mg (15 mg Intravenous Given  07/26/22 0908)  pantoprazole (PROTONIX) injection 40 mg (40 mg Intravenous Given 07/26/22 0909)  iohexol (OMNIPAQUE) 300 MG/ML solution 100 mL (100 mLs Intravenous Contrast Given 07/26/22 0916)  HYDROmorphone (DILAUDID) injection 1 mg (1 mg Intravenous Given 07/26/22 1019)  HYDROmorphone (DILAUDID) injection 1 mg (1 mg Intravenous Given 07/26/22 1122)     IMPRESSION / MDM / ASSESSMENT AND PLAN / ED COURSE  I reviewed the triage vital signs and the nursing notes.                              Differential diagnosis includes, but is not limited to, acute on chronic pancreatitis, gastritis, GI perforation, intra-abdominal abscess, dehydration  Patient's presentation is most consistent with severe exacerbation of chronic illness.  Patient presents with worsening of abdominal pain likely related to chronic pancreatitis.  Not able to tolerate oral intake and will need IV fluids and parenteral  medications to control symptoms.  Labs are reassuring.  Will obtain CT scan again today to look for interval change in pancreatitis severity.  With abdominal pain being more generalized this may be viral with prevalence of GI viral syndromes in the community being very high right now.  ----------------------------------------- 11:29 AM on 07/26/2022 ----------------------------------------- CT scan shows signs of acute on chronic pancreatitis with new small pseudocyst developing, no infectious changes.  Pain is still intractable.  Giving another dose of IV Dilaudid for now, will need to admit for further management.  Case discussed with the hospitalist.       FINAL CLINICAL IMPRESSION(S) / ED DIAGNOSES   Final diagnoses:  Acute on chronic pancreatitis ([place])  Type 2 diabetes mellitus without complication, with long-term current use of insulin ([place])     Rx / DC Orders   ED Discharge Orders     None        Note:  This document was prepared using Dragon voice recognition software and may include unintentional dictation errors.   Carrie Mew, MD 07/26/22 1130

## 2022-07-26 NOTE — Assessment & Plan Note (Signed)
-   Heart healthy diet discussed with patient -Continue the use of statin.

## 2022-07-26 NOTE — Assessment & Plan Note (Signed)
-   Stable -No wheezing demonstrating good saturation on room air -Continue as needed bronchodilators.

## 2022-07-26 NOTE — Assessment & Plan Note (Signed)
-   Recent A1c 7.9 -Sliding scale insulin and Semglee will be started.  Dose adjusted while patient is n.p.o. -Follow CBGs fluctuation and further adjust hypoglycemic regimen as needed

## 2022-07-26 NOTE — Assessment & Plan Note (Signed)
-   Stable mood. -Continue as needed Xanax and the use of Zoloft.

## 2022-07-26 NOTE — Assessment & Plan Note (Signed)
-   Heart healthy diet discussed with patient -Continue the use of a statins.

## 2022-07-26 NOTE — Progress Notes (Signed)
Pt offered non-slip socks and refused. Educated on the importance of wearing socks to prevent falls.

## 2022-07-26 NOTE — Assessment & Plan Note (Addendum)
-   Patient with history of chronic pancreatitis -Recent admission for the same; reports that soon as he gets home had difficulty tolerating diet and with worsening pain. -He has underlying history of hyperlipidemia/hypertriglyceridemia; the main cause for his pancreatitis has been driven by alcohol. Reports that he is barely drinking any this days, and none since his discharge. No withdrawal ever reported. -Patient will be admitted to [facility] -Will keep N.p.o. except for sips with medications and ice chips. -Provide fluid resuscitation, analgesics and antiemetics. -Started on Creon 3 times daily. -CT abdomen demonstrating mild swelling/inflammatory changes in his pancreas suggestive of pancreatitis but no other concerning findings. -Patient is afebrile and with normal WBCs.

## 2022-07-26 NOTE — ED Notes (Signed)
Pt complains of upper and right side abd pain and mid back pain. States n/v and inability to keep food down since Sunday. Denies diarrhea.

## 2022-07-26 NOTE — ED Triage Notes (Signed)
Pt to ER with c/o abdominal pain. Pt had recent admission for pancreatitis.  States was seen here again on Sunday for continued pain.  Pt states pain has continued since that time.  Pt states n/v accompanies pain.

## 2022-07-26 NOTE — Progress Notes (Deleted)
Name: Shaun Odonnell  Age/ Sex: 56 y.o., male   MRN/ DOB: [id number], 1966/03/10     PCP: [place]   Reason for Endocrinology Evaluation: Type 2 Diabetes Mellitus  Initial Endocrine Consultative Visit: 01/05/2022    PATIENT IDENTIFIER: [name] is a 57 y.o. male with a past medical history of T2DM, Hx chronic pancreatitis, schizoaffective disorder, Hx ETOH abuse. The patient has followed with Endocrinology clinic since 01/05/2022 for consultative assistance with management of his diabetes.  DIABETIC HISTORY:  Shaun Odonnell was diagnosed with DM 2006, and started insulin therapy in 2018. His hemoglobin A1c has ranged from 11.9% in 2023, peaking at 12.1% in 2022.   SUBJECTIVE:   During the last visit (05/22/2022): Saw Dr. Loanne Drilling   Today (07/26/2022): Shaun Odonnell is here for follow-up on diabetes management.  He checks his blood sugars multiple  times daily, through CGM. The patient has not had hypoglycemic episodes since the last clinic visit   He presented to the ED on 07/21/2022 for acute on chronic pancreatitis, CT showed inflammatory changes   At discharge he was sent home with 15 units of Toujeo and 3 units of prandial insulin with each meal due to hypoglycemia requiring D50  HOME DIABETES REGIMEN:  Jardiance 25 mg daily Toujeo 74 units daily-has been taking 15 units Novolin-R 10 units 3 times daily before every meal Correction factor: Regular insulin (BG -130/25)      Statin: Yes ACE-I/ARB: No   DEXCOM: Unable to download today    DIABETIC COMPLICATIONS: Microvascular complications:  Cataract sx  Denies: CKD, neuropathy  Last Eye Exam: Completed 06/2021  Macrovascular complications:   Denies: CAD, CVA, PVD   HISTORY:  Past Medical History:  Past Medical History:  Diagnosis Date   Alcohol abuse 04/29/2019   Allergy    Anxiety    ARDS (adult respiratory distress syndrome) (HCC)    Asthma    Bipolar disorder (HCC)    Cataract     Chronic kidney disease    per pt - no current problems   COPD (chronic obstructive pulmonary disease) (HCC)    chronic cough and wheezing   Depression    Diabetes mellitus without complication ([place])    type 2   Dizziness    medication related   Dyspnea    with exertion   Elevated lipids    Fatty liver    Hypercholesterolemia    Hypertension    per pt, no current prob-no meds   Pancreatitis    Pneumonia    in past   Pulmonary embolism ([place])    Schizoaffective disorder ([place])    Past Surgical History:  Past Surgical History:  Procedure Laterality Date   BIOPSY  08/31/2021   Procedure: BIOPSY;  Surgeon: Rush Landmark Telford Nab., MD;  Location: [place];  Service: Gastroenterology;;   CATARACT EXTRACTION W/PHACO Right 03/26/2018   Procedure: CATARACT EXTRACTION PHACO AND INTRAOCULAR LENS PLACEMENT ([place]) RIGHT BORDERLINE DIABETIC;  Surgeon: Leandrew Koyanagi, MD;  Location: [place];  Service: Ophthalmology;  Laterality: Right;   CATARACT EXTRACTION W/PHACO Left 04/23/2018   Procedure: CATARACT EXTRACTION PHACO AND INTRAOCULAR LENS PLACEMENT ([place])  LEFT BORDERLINE DIABETIC;  Surgeon: Leandrew Koyanagi, MD;  Location: [place];  Service: Ophthalmology;  Laterality: Left;   COLONOSCOPY     COLONOSCOPY WITH PROPOFOL N/A 08/21/2017   Procedure: COLONOSCOPY WITH PROPOFOL;  Surgeon: Manya Silvas, MD;  [facility];  Service: Endoscopy;  Laterality: N/A;   COLONOSCOPY WITH  PROPOFOL N/A 12/15/2021   Procedure: COLONOSCOPY WITH BIOPSY;  Surgeon: [name], MD;  Location: [place];  Service: Endoscopy;  Laterality: N/A;  Diabetic   ENDOSCOPIC RETROGRADE CHOLANGIOPANCREATOGRAPHY (ERCP) WITH PROPOFOL N/A 08/31/2021   Procedure: ENDOSCOPIC RETROGRADE CHOLANGIOPANCREATOGRAPHY (ERCP) WITH PROPOFOL;  Surgeon: Rush Landmark Telford Nab., MD;  Location: [place];  Service: Gastroenterology;  Laterality: N/A;   ESOPHAGOGASTRODUODENOSCOPY  (EGD) WITH PROPOFOL N/A 08/31/2021   Procedure: ESOPHAGOGASTRODUODENOSCOPY (EGD) WITH PROPOFOL;  Surgeon: Rush Landmark Telford Nab., MD;  Location: [place];  Service: Gastroenterology;  Laterality: N/A;   EYE SURGERY     FINE NEEDLE ASPIRATION N/A 08/31/2021   Procedure: FINE NEEDLE ASPIRATION (FNA) LINEAR;  Surgeon: Irving Copas., MD;  Location: [place];  Service: Gastroenterology;  Laterality: N/A;   HERNIA REPAIR     inguinal and umbilical   POLYPECTOMY N/A 12/15/2021   Procedure: POLYPECTOMY;  Surgeon: [name], MD;  Location: [place];  Service: Endoscopy;  Laterality: N/A;   RIB RESECTION N/A 08/20/2019   Procedure: removal of xyphoid process;  Surgeon: Nestor Lewandowsky, MD;  Location: [facility];  Service: Thoracic;  Laterality: N/A;   SPHINCTEROTOMY  08/31/2021   Procedure: Joan Mayans;  Surgeon: Mansouraty, Telford Nab., MD;  Location: [place];  Service: Gastroenterology;;   STENT REMOVAL     UMBILICAL HERNIA REPAIR N/A 06/22/2019   Procedure: OPEN HERNIA REPAIR UMBILICAL ADULT;  Surgeon: Olean Ree, MD;  Location: [facility];  Service: General;  Laterality: N/A;   UPPER ESOPHAGEAL ENDOSCOPIC ULTRASOUND (EUS) N/A 08/31/2021   Procedure: UPPER ESOPHAGEAL ENDOSCOPIC ULTRASOUND (EUS);  Surgeon: Irving Copas., MD;  Location: [place];  Service: Gastroenterology;  Laterality: N/A;   UPPER GI ENDOSCOPY     Social History:  reports that he quit smoking about 3 months ago. His smoking use included cigarettes. He has a 17.00 pack-year smoking history. He quit smokeless tobacco use about 33 years ago.  His smokeless tobacco use included snuff. He reports current alcohol use of about 6.0 standard drinks of alcohol per week. He reports that he does not currently use drugs. Family History:  Family History  Problem Relation Age of Onset   Hyperlipidemia Mother    Hypertension Father    Stroke Father    Diabetes Maternal Aunt    Dementia  Maternal Grandmother    Heart disease Maternal Grandfather    Heart disease Paternal Grandmother    Stroke Paternal Grandfather    Diabetes Paternal Grandfather    Colon cancer Neg Hx    Esophageal cancer Neg Hx    Inflammatory bowel disease Neg Hx    Liver disease Neg Hx    Pancreatic cancer Neg Hx    Stomach cancer Neg Hx    Rectal cancer Neg Hx      HOME MEDICATIONS: Allergies as of 07/26/2022       Reactions   Fentanyl Nausea And Vomiting   Morphine And Related Shortness Of Breath   Neurontin [gabapentin] Other (See Comments)   Caused severe over sedation   Clonazepam Other (See Comments)   Other reaction(s): "Dystonic"; alprazolam is a home med Dystonic Reaction   Pimozide Other (See Comments)   Other reaction(s): "Distonic" Dystonic Reaction   Trifluoperazine Other (See Comments)   Other (See Comments) "Distonic" Dystonic Reaction   Azithromycin Other (See Comments)   Was told not to take Z pak due to his heart   Montelukast    headache, nausea, feeling a little anxious, panic   Morphine Other (See Comments)  Other reaction(s): " I stop breathing"; can take Diluadid Cnnot tolerate d/t Drug Metabolism   Tramadol Nausea Only        Medication List        Accurate as of July 26, 2022  7:26 AM. If you have any questions, ask your nurse or doctor.          ALPRAZolam 0.5 MG tablet Commonly known as: XANAX Take 0.5 mg by mouth 4 (four) times daily. Patient takes when wakes up(~0600)/ 1000/ 1600/ 2000   Dexcom G6 Sensor Misc 1 Device by Does not apply route See admin instructions. Change every 10 days   Dexcom G6 Transmitter Misc 1 Device by Other route as directed. Every 10 days   Eszopiclone 3 MG Tabs Take 3 mg by mouth at bedtime as needed.   fenofibrate 145 MG tablet Commonly known as: TRICOR Take 1 tablet (145 mg total) by mouth daily.   glucose blood test strip Use as instructed   GNP Insulin Syringes 30G X 5/16" 1 ML  Misc Generic drug: Insulin Syringe-Needle U-100 USE AS DIRECTED. WITH BOTH INSULINS 5 TIMES A DAY   NovoLIN R FlexPen ReliOn 100 UNIT/ML KwikPen Generic drug: Insulin Regular Human 3 units with meals   OLANZapine 20 MG tablet Commonly known as: ZYPREXA Take 20 mg by mouth at bedtime.   omeprazole 40 MG capsule Commonly known as: PRILOSEC TAKE 1 CAPSULE BY MOUTH ONCE DAILY   ondansetron 8 MG disintegrating tablet Commonly known as: ZOFRAN-ODT Take 1 tablet (8 mg total) by mouth every 8 (eight) hours as needed for nausea or vomiting.   OneTouch Delica Lancets 20U Misc USE TO CHECK FASTING SUGAR TWICE DAILY   oxyCODONE 5 MG immediate release tablet Commonly known as: Oxy IR/ROXICODONE Take 1 tablet (5 mg total) by mouth every 6 (six) hours as needed for severe pain.   rosuvastatin 40 MG tablet Commonly known as: CRESTOR Take 1 tablet (40 mg total) by mouth daily.   sertraline 100 MG tablet Commonly known as: ZOLOFT Take 100 mg by mouth daily.   Toujeo Max SoloStar 300 UNIT/ML Solostar Pen Generic drug: insulin glargine (2 Unit Dial) Inject 15 Units into the skin daily in the afternoon.   Ultracare Pen Needles 32G X 4 MM Misc Generic drug: Insulin Pen Needle Use to inject insulin as needed         OBJECTIVE:   Vital Signs: There were no vitals taken for this visit.  Wt Readings from Last 3 Encounters:  07/22/22 206 lb (93.4 kg)  07/19/22 206 lb (93.4 kg)  07/05/22 211 lb (95.7 kg)     Exam: General: Pt appears well and is in NAD  Neck: General: Supple without adenopathy. Thyroid: Thyroid size normal.  No goiter or nodules appreciated.   Lungs: Clear with good BS bilat with no rales, rhonchi, or wheezes  Heart: RRR   Abdomen: Normoactive bowel sounds, soft, nontender, without masses or organomegaly palpable  Extremities: No pretibial edema.   Neuro: MS is good with appropriate affect, pt is alert and Ox3     DATA REVIEWED:  Lab Results  Component  Value Date   HGBA1C 7.9 (A) 05/31/2022   HGBA1C 10.8 (A) 01/05/2022   HGBA1C 11.9 (H) 12/11/2021   Lab Results  Component Value Date   MICROALBUR 10 06/05/2022   LDLCALC 42 07/20/2022   CREATININE 0.78 07/22/2022   Lab Results  Component Value Date   MICRALBCREAT <30 06/05/2022     Lab Results  Component Value Date   CHOL 113 07/20/2022   HDL 47 07/20/2022   LDLCALC 42 07/20/2022   TRIG 121 07/20/2022   CHOLHDL 2.4 07/20/2022         ASSESSMENT / PLAN / RECOMMENDATIONS:   1) Type 2 Diabetes Mellitus, Sub-Optimally controlled, Without complications - Most recent A1c of 7.9 %. Goal A1c < 7.0 %.     -His A1c has trended down from 10.8% to 7.9% -He cannot take insulin mix -He would like to switch to insulin pens, he is currently on NPH and regular vials, and a couple times he almost switch taking insulin -Patient has Hx of pancreatitis, hence not a candidate for DPP 4 inhibitors, GLP-1 agonist nor Mounjaro -He was also under the impression that insulin vials can stay at room temperature once opened, but I did explain to the patient that insulin pens may remain at home temperature once open but the vials has to stay refrigerated at all times -I am going to attempt to switch his NPH to Toujeo as below -I will also attempt to prescribe regular insulin in the pen form   MEDICATIONS: Continue Jardiance 25 mg daily Stop NPH Start Toujeo 74 units daily Novolin-R 10 units 3 times daily before every meal Correction factor: Regular insulin (BG -130/25)  EDUCATION / INSTRUCTIONS: BG monitoring instructions: Patient is instructed to check his blood sugars 3 times a day, before each meal. Call [place] [facility] if: BG persistently < 70  I reviewed the Rule of 15 for the treatment of hypoglycemia in detail with the patient. Literature supplied.   2) Diabetic complications:  Eye: Does not have known diabetic retinopathy.  Neuro/ Feet: Does not have known diabetic  peripheral neuropathy .  Renal: Patient does not have known baseline CKD. He   is not on an ACEI/ARB at present.   F/U in 4 months   Signed electronically by: Mack Guise, MD  [facility]  [facility] [address] [place], [place] [address] Phone: [phone] FAX: [phone]   CC: [place] [address] [place] [address] Phone: [phone]  Fax: [phone]  Return to Endocrinology clinic as below: Future Appointments  Date Time Provider [place]  07/26/2022 10:50 AM Vilda Zollner, Melanie Crazier, MD [facility] None  08/07/2022  2:15 PM Jonathon Bellows, MD [facility] None  10/23/2022 11:50 AM Costa Jha, Melanie Crazier, MD [facility] None  10/31/2022  9:00 AM Gwyneth Sprout, FNP [facility]

## 2022-07-26 NOTE — H&P (Addendum)
History and Physical    Patient: Shaun Odonnell DOB: [birth year] DOA: 07/26/2022 DOS: the patient was seen and examined on 07/26/2022 PCP: [place]  Patient coming from: Home  Chief Complaint:  Chief Complaint  Patient presents with   Abdominal Pain   HPI: Shaun Odonnell is a 56 y.o. male with medical history significant of class I obesity, history of COPD, chronic pancreatitis (alcohol induced), history of alcohol abuse (has been alcohol free for the last week prior to admission), type 2 diabetes mellitus, hyperlipidemia, anxiety/depression and fatty liver; who presented to the emergency department secondary to abdominal pain with associated nausea/vomiting.  Patient expressed symptoms are characteristic and similar to previous flares of pancreatitis.  He was just discharged on 07/21/22 due to acute on chronic pancreatitis admission.   Patient reported that since his discharge he is having experiencing worsening symptoms and has stroke all to be able to tolerate diet and medications.  Patient reports no shortness of breath, no hematemesis, no fever/chills, no sick contacts, no melena, no hematochezia, no dysuria, no hematuria, no focal weaknesses or any other complaints.  In the ED work-up demonstrating CT scan with changes resembling acute pancreatitis process and a stable pseudocyst.  TRH has been consulted to place patient in the hospital for further evaluation and management.    Review of Systems: As mentioned in the history of present illness. All other systems reviewed and are negative. Past Medical History:  Diagnosis Date   Alcohol abuse 04/29/2019   Allergy    Anxiety    ARDS (adult respiratory distress syndrome) (HCC)    Asthma    Bipolar disorder (HCC)    Cataract    Chronic kidney disease    per pt - no current problems   COPD (chronic obstructive pulmonary disease) (HCC)    chronic cough and wheezing   Depression    Diabetes mellitus  without complication ([place])    type 2   Dizziness    medication related   Dyspnea    with exertion   Elevated lipids    Fatty liver    Hypercholesterolemia    Hypertension    per pt, no current prob-no meds   Pancreatitis    Pneumonia    in past   Pulmonary embolism ([place])    Schizoaffective disorder ([place])    Past Surgical History:  Procedure Laterality Date   BIOPSY  08/31/2021   Procedure: BIOPSY;  Surgeon: Irving Copas., MD;  Location: [place];  Service: Gastroenterology;;   CATARACT EXTRACTION W/PHACO Right 03/26/2018   Procedure: CATARACT EXTRACTION PHACO AND INTRAOCULAR LENS PLACEMENT ([place]) RIGHT BORDERLINE DIABETIC;  Surgeon: Leandrew Koyanagi, MD;  Location: [place];  Service: Ophthalmology;  Laterality: Right;   CATARACT EXTRACTION W/PHACO Left 04/23/2018   Procedure: CATARACT EXTRACTION PHACO AND INTRAOCULAR LENS PLACEMENT ([place])  LEFT BORDERLINE DIABETIC;  Surgeon: Leandrew Koyanagi, MD;  Location: [place];  Service: Ophthalmology;  Laterality: Left;   COLONOSCOPY     COLONOSCOPY WITH PROPOFOL N/A 08/21/2017   Procedure: COLONOSCOPY WITH PROPOFOL;  Surgeon: Manya Silvas, MD;  Location: [facility] ENDOSCOPY;  Service: Endoscopy;  Laterality: N/A;   COLONOSCOPY WITH PROPOFOL N/A 12/15/2021   Procedure: COLONOSCOPY WITH BIOPSY;  Surgeon: [name], MD;  Location: [place];  Service: Endoscopy;  Laterality: N/A;  Diabetic   ENDOSCOPIC RETROGRADE CHOLANGIOPANCREATOGRAPHY (ERCP) WITH PROPOFOL N/A 08/31/2021   Procedure: ENDOSCOPIC RETROGRADE CHOLANGIOPANCREATOGRAPHY (ERCP) WITH PROPOFOL;  Surgeon: Irving Copas., MD;  Location:  [place] [facility]OSCOPY;  Service: Gastroenterology;  Laterality: N/A;   ESOPHAGOGASTRODUODENOSCOPY (EGD) WITH PROPOFOL N/A 08/31/2021   Procedure: ESOPHAGOGASTRODUODENOSCOPY (EGD) WITH PROPOFOL;  Surgeon: Rush Landmark Telford Nab., MD;  Location: [place];  Service: Gastroenterology;  Laterality:  N/A;   EYE SURGERY     FINE NEEDLE ASPIRATION N/A 08/31/2021   Procedure: FINE NEEDLE ASPIRATION (FNA) LINEAR;  Surgeon: Irving Copas., MD;  Location: [place];  Service: Gastroenterology;  Laterality: N/A;   HERNIA REPAIR     inguinal and umbilical   POLYPECTOMY N/A 12/15/2021   Procedure: POLYPECTOMY;  Surgeon: [name], MD;  Location: [place];  Service: Endoscopy;  Laterality: N/A;   RIB RESECTION N/A 08/20/2019   Procedure: removal of xyphoid process;  Surgeon: Nestor Lewandowsky, MD;  Location: [facility];  Service: Thoracic;  Laterality: N/A;   SPHINCTEROTOMY  08/31/2021   Procedure: Joan Mayans;  Surgeon: Mansouraty, Telford Nab., MD;  Location: [place];  Service: Gastroenterology;;   STENT REMOVAL     UMBILICAL HERNIA REPAIR N/A 06/22/2019   Procedure: OPEN HERNIA REPAIR UMBILICAL ADULT;  Surgeon: Olean Ree, MD;  Location: [facility];  Service: General;  Laterality: N/A;   UPPER ESOPHAGEAL ENDOSCOPIC ULTRASOUND (EUS) N/A 08/31/2021   Procedure: UPPER ESOPHAGEAL ENDOSCOPIC ULTRASOUND (EUS);  Surgeon: Irving Copas., MD;  Location: [place];  Service: Gastroenterology;  Laterality: N/A;   UPPER GI ENDOSCOPY     Social History:  reports that he quit smoking about 3 months ago. His smoking use included cigarettes. He has a 17.00 pack-year smoking history. He quit smokeless tobacco use about 33 years ago.  His smokeless tobacco use included snuff. He reports current alcohol use of about 6.0 standard drinks of alcohol per week. He reports that he does not currently use drugs.  Allergies  Allergen Reactions   Fentanyl Nausea And Vomiting   Morphine And Related Shortness Of Breath   Neurontin [Gabapentin] Other (See Comments)    Caused severe over sedation.  Loss of faculties   Clonazepam Other (See Comments)    Other reaction(s): "Dystonic"; alprazolam is a home med Dystonic Reaction   Pimozide Other (See Comments)    Other  reaction(s): "Distonic" Dystonic Reaction   Trifluoperazine Other (See Comments)    Other (See Comments) "Distonic" Dystonic Reaction   Azithromycin Other (See Comments)    Was told not to take Z pak due to his heart   Montelukast     headache, nausea, feeling a little anxious, panic   Morphine Other (See Comments)    Other reaction(s): " I stop breathing"; can take Diluadid Cnnot tolerate d/t Drug Metabolism   Tramadol Nausea Only    Family History  Problem Relation Age of Onset   Hyperlipidemia Mother    Hypertension Father    Stroke Father    Diabetes Maternal Aunt    Dementia Maternal Grandmother    Heart disease Maternal Grandfather    Heart disease Paternal Grandmother    Stroke Paternal Grandfather    Diabetes Paternal Grandfather    Colon cancer Neg Hx    Esophageal cancer Neg Hx    Inflammatory bowel disease Neg Hx    Liver disease Neg Hx    Pancreatic cancer Neg Hx    Stomach cancer Neg Hx    Rectal cancer Neg Hx     Prior to Admission medications   Medication Sig Start Date End Date Taking? Authorizing Provider  albuterol (ACCUNEB) 0.63 MG/3ML nebulizer solution Take 1 ampule by nebulization every 6 (six) hours  as needed for wheezing.   Yes [provider]  ALPRAZolam Duanne Moron) 0.5 MG tablet Take 0.5 mg by mouth 4 (four) times daily. Patient takes when wakes up(~0600)/ 1000/ 1600/ 2000 06/26/18  Yes [provider]  celecoxib (CELEBREX) 200 MG capsule Take 200 mg by mouth 2 (two) times daily.   Yes [provider]  Eszopiclone 3 MG TABS Take 3 mg by mouth at bedtime as needed. 02/23/22  Yes [provider]  insulin glargine, 2 Unit Dial, (TOUJEO MAX SOLOSTAR) 300 UNIT/ML Solostar Pen Inject 15 Units into the skin daily in the afternoon. Patient taking differently: Inject 60 Units into the skin daily in the afternoon. 07/21/22  Yes Wieting, Richard, MD  Insulin Regular Human (NOVOLIN R FLEXPEN RELION) 100 UNIT/ML KwikPen 3 units  with meals 07/21/22  Yes Wieting, Richard, MD  OLANZapine (ZYPREXA) 20 MG tablet Take 20 mg by mouth at bedtime. 04/04/21  Yes [provider]  omeprazole (PRILOSEC) 40 MG capsule TAKE 1 CAPSULE BY MOUTH ONCE DAILY 07/02/22  Yes Mansouraty, Telford Nab., MD  ondansetron (ZOFRAN-ODT) 8 MG disintegrating tablet Take 1 tablet (8 mg total) by mouth every 8 (eight) hours as needed for nausea or vomiting. 07/21/22  Yes Wieting, Richard, MD  rosuvastatin (CRESTOR) 40 MG tablet Take 1 tablet (40 mg total) by mouth daily. 06/15/22  Yes Cannady, Jolene T, NP  sertraline (ZOLOFT) 100 MG tablet Take 100 mg by mouth daily.    Yes [provider]  Continuous Blood Gluc Sensor (DEXCOM G6 SENSOR) MISC 1 Device by Does not apply route See admin instructions. Change every 10 days 05/31/22   Shamleffer, Melanie Crazier, MD  Continuous Blood Gluc Transmit (DEXCOM G6 TRANSMITTER) MISC 1 Device by Other route as directed. Every 10 days 05/31/22   Shamleffer, Melanie Crazier, MD  fenofibrate (TRICOR) 145 MG tablet Take 1 tablet (145 mg total) by mouth daily. 05/11/22   Marnee Guarneri T, NP  glucose blood test strip Use as instructed 12/17/19   Trinna Post, PA-C  [facility] INSULIN SYRINGES 30G X 5/16" 1 ML MISC USE AS DIRECTED. WITH BOTH INSULINS 5 TIMES A DAY 06/04/22   Shamleffer, Melanie Crazier, MD  Insulin Pen Needle (ULTRACARE PEN NEEDLES) 32G X 4 MM MISC Use to inject insulin as needed 07/05/22   Shamleffer, Melanie Crazier, MD  OneTouch Delica Lancets 40N MISC USE TO CHECK FASTING SUGAR TWICE DAILY 12/07/20   Trinna Post, PA-C  oxyCODONE (OXY IR/ROXICODONE) 5 MG immediate release tablet Take 1 tablet (5 mg total) by mouth every 6 (six) hours as needed for severe pain. Patient not taking: Reported on 07/26/2022 07/21/22   Loletha Grayer, MD    Physical Exam: Vitals:   07/26/22 0753 07/26/22 0930 07/26/22 1030 07/26/22 1145  BP:  (!) 156/95 129/83   Pulse:  73 63   Resp:  18 18   Temp:    98.1 F  (36.7 C)  TempSrc:    Oral  SpO2:  100% 95%   Weight: 93.4 kg     Height: '5\' 8"'$  (1.727 m)      General exam: Alert, awake, oriented x 3; expressing to be nauseated; currently no vomiting.  Afebrile.  No chest pain or shortness of breath. Respiratory system: Clear to auscultation. Respiratory effort normal.  Good aeration on room air; no using accessory muscles. Cardiovascular system:RRR. No murmurs, rubs, gallops.  No JVD. Gastrointestinal system: Abdomen is obese, nondistended, soft and mildly tender to deep palpation in his epigastric/right  upper quadrant area.  No organomegaly or masses felt. Normal bowel sounds heard. Central nervous system: Alert and oriented. No focal neurological deficits. Extremities: No cyanosis or clubbing. Skin: No petechiae. Psychiatry: Judgement and insight appear normal. Mood & affect appropriate.   Data Reviewed: Lipase: 39 (not necessarily expecting elevation in the setting of chronic process). Comprehensive metabolic panel: Sodium 546, potassium 3.7, bicarb 26, chloride 101, CBGs 162 220, BUN 14 and creatinine 0.82.  Normal LFTs appreciated on exam. CBC: WBCs 10.1, hemoglobin 15.0 and platelet count 248 K.  Assessment and Plan: Acute on chronic pancreatitis ([place]) - Patient with history of chronic pancreatitis -Recent admission for the same; reports that soon as he gets home had difficulty tolerating diet and with worsening pain. -He has underlying history of hyperlipidemia/hypertriglyceridemia; the main cause for his pancreatitis has been driven by alcohol. Reports that he is barely drinking any this days, and none since his discharge. No withdrawal ever reported. -Patient will be admitted to [facility] -Will keep N.p.o. except for sips with medications and ice chips. -Provide fluid resuscitation, analgesics and antiemetics. -Started on Creon 3 times daily. -CT abdomen demonstrating mild swelling/inflammatory changes in his pancreas suggestive of  pancreatitis but no other concerning findings. -Patient is afebrile and with normal WBCs.  History of COPD - Stable -No wheezing demonstrating good saturation on room air -Continue as needed bronchodilators.  History of insulin dependent diabetes mellitus - Recent A1c 7.9 -Sliding scale insulin and Semglee will be started.  Dose adjusted while patient is n.p.o. -Follow CBGs fluctuation and further adjust hypoglycemic regimen as needed   Anxiety and depression - Stable mood. -Continue as needed Xanax and the use of Zoloft.  Gastroesophageal reflux disease with esophagitis without hemorrhage - Continue PPI.  Hyperlipidemia associated with type 2 diabetes mellitus ([place]) - Heart healthy diet discussed with patient -Continue the use of statin.  Bipolar depression (HCC)-resolved as of 07/26/2022 - Heart healthy diet discussed with patient -Continue the use of a statins.   Advance Care Planning:   Code Status: Full Code   Consults: None  Family Communication: No family at bedside.  Severity of Illness: The appropriate patient status for this patient is INPATIENT. Inpatient status is judged to be reasonable and necessary in order to provide the required intensity of service to ensure the patient's safety. The patient's presenting symptoms, physical exam findings, and initial radiographic and laboratory data in the context of their chronic comorbidities is felt to place them at high risk for further clinical deterioration. Furthermore, it is not anticipated that the patient will be medically stable for discharge from the hospital within 2 midnights of admission.   * I certify that at the point of admission it is my clinical judgment that the patient will require inpatient hospital care spanning beyond 2 midnights from the point of admission due to high intensity of service, high risk for further deterioration and high frequency of surveillance required.*  Author: Barton Dubois,  MD 07/26/2022 12:10 PM  For on call review [email or web address].

## 2022-07-27 ENCOUNTER — Inpatient Hospital Stay: Payer: Medicare Other

## 2022-07-27 DIAGNOSIS — F319 Bipolar disorder, unspecified: Secondary | ICD-10-CM

## 2022-07-27 DIAGNOSIS — K863 Pseudocyst of pancreas: Secondary | ICD-10-CM | POA: Diagnosis not present

## 2022-07-27 DIAGNOSIS — K861 Other chronic pancreatitis: Secondary | ICD-10-CM | POA: Diagnosis not present

## 2022-07-27 DIAGNOSIS — K859 Acute pancreatitis without necrosis or infection, unspecified: Secondary | ICD-10-CM | POA: Diagnosis not present

## 2022-07-27 LAB — CBC
HCT: 39.4 % (ref 39.0–52.0)
Hemoglobin: 12.9 g/dL — ABNORMAL LOW (ref 13.0–17.0)
MCH: 30.6 pg (ref 26.0–34.0)
MCHC: 32.7 g/dL (ref 30.0–36.0)
MCV: 93.6 fL (ref 80.0–100.0)
Platelets: 208 10*3/uL (ref 150–400)
RBC: 4.21 MIL/uL — ABNORMAL LOW (ref 4.22–5.81)
RDW: 13.5 % (ref 11.5–15.5)
WBC: 10 10*3/uL (ref 4.0–10.5)
nRBC: 0 % (ref 0.0–0.2)

## 2022-07-27 LAB — GLUCOSE, CAPILLARY
Glucose-Capillary: 104 mg/dL — ABNORMAL HIGH (ref 70–99)
Glucose-Capillary: 67 mg/dL — ABNORMAL LOW (ref 70–99)
Glucose-Capillary: 228 mg/dL — ABNORMAL HIGH (ref 70–99)
Glucose-Capillary: 136 mg/dL — ABNORMAL HIGH (ref 70–99)
Glucose-Capillary: 144 mg/dL — ABNORMAL HIGH (ref 70–99)

## 2022-07-27 LAB — COMPREHENSIVE METABOLIC PANEL
ALT: 24 U/L (ref 0–44)
AST: 29 U/L (ref 15–41)
Albumin: 3.3 g/dL — ABNORMAL LOW (ref 3.5–5.0)
Alkaline Phosphatase: 162 U/L — ABNORMAL HIGH (ref 38–126)
Anion gap: 10 (ref 5–15)
BUN: 14 mg/dL (ref 6–20)
CO2: 24 mmol/L (ref 22–32)
Calcium: 8.5 mg/dL — ABNORMAL LOW (ref 8.9–10.3)
Chloride: 102 mmol/L (ref 98–111)
Creatinine, Ser: 0.7 mg/dL (ref 0.61–1.24)
GFR, Estimated: >60 mL/min (ref >60)
Glucose, Bld: 107 mg/dL — ABNORMAL HIGH (ref 70–99)
Potassium: 3.7 mmol/L (ref 3.5–5.1)
Sodium: 136 mmol/L (ref 135–145)
Total Bilirubin: 1.5 mg/dL — ABNORMAL HIGH (ref 0.3–1.2)
Total Protein: 6.9 g/dL (ref 6.5–8.1)

## 2022-07-27 MED ORDER — DEXTROSE IN LACTATED RINGERS 5 % IV SOLN: INTRAVENOUS | Status: DC

## 2022-07-27 MED ORDER — OXYCODONE HCL 5 MG PO TABS
10.0000 mg | ORAL_TABLET | ORAL | Status: DC | PRN
  Administered 2022-07-27 – 2022-07-29 (×11): 10 mg via ORAL
  Filled 2022-07-27 (×11): qty 2

## 2022-07-27 MED ORDER — DEXTROSE 50 % IV SOLN
25.0000 mL | Freq: Every day | INTRAVENOUS | Status: DC | PRN
  Administered 2022-07-27: 25 mL via INTRAVENOUS

## 2022-07-27 NOTE — Discharge Instructions (Signed)
                  Intensive Outpatient Programs  [facility]    [facility] [address]     [address] [place],  [place]     [place], [place] [phone]      [phone]  [facility]   [facility]  (Inpatient and outpatient)  [phone] (Suboxone and Methadone) [address]           [phone]           [facility]    [facility] - Intensive Outpatient [address]     [address] [place], [place] [address]     [place], [place]  [phone]      [phone]  [facility] (Outpatient, Inpatient, Chemical  [facility] (Groups and [facility]idental) (insurance only) [phone]    [place], [place]          [phone]       [facility]    [facility] (for caregivers and family) [address]     [address] [place], [place]     [place], [place] [phone]      [phone]  Residential Treatment Programs  [facility]  [facility](2 years) Residential: 90 days)  [facility] [address]      [address] [place], [place]     [place], [place] [phone]      [phone] or [phone]  [facility]    [facility] [address]      [address] [place], [place]     [place], [place] [phone]      [phone]  [facility]   [facility] ([facility]) [address]     [address] [place], [place] [address]     [place], [place] [phone]      [phone] Admissions: 8am-3pm M-F  [facility]: Residential Program (90 Days)              [facility]: [facility]  [place], [place]     [place], [place]  [phone] or [phone]    (Walk in Hours over the weekend or by referral)   [facility]: [facility]:[phone] (for crisis  response 24 hours a day) 

## 2022-07-27 NOTE — Progress Notes (Signed)
Paged Dr. Sidney Ace to clarify if Pts. Semglee should be administered after his episode of hypoglycemia earlier this evening. Semglee should be held for 24 hours after the episode, per Dr. Sidney Ace.

## 2022-07-27 NOTE — Hospital Course (Signed)
Shaun Odonnell is a 56 y.o. male with medical history significant of class I obesity, history of COPD, chronic pancreatitis (alcohol induced), history of alcohol abuse (has been alcohol free for the last week prior to admission), type 2 diabetes mellitus, hyperlipidemia, anxiety/depression and fatty liver; who presented to the emergency department secondary to abdominal pain with associated nausea/vomiting.  CT scan showed acute on chronic pancreatitis with stable 3.2 cm pancreatic pseudocyst in pancreatic tail. New 3 cm poorly defined low-attenuation area between the pancreatic uncinate process and transverse duodenum. Patient is admitted to the hospital for symptomatic treatment.

## 2022-07-27 NOTE — Progress Notes (Signed)
Inpatient Diabetes Program Recommendations  AACE/ADA: New Consensus Statement on Inpatient Glycemic Control (2015)  Target Ranges:  Prepandial:   less than 140 mg/dL      Peak postprandial:   less than 180 mg/dL (1-2 hours)      Critically ill patients:  140 - 180 mg/dL   Lab Results  Component Value Date   GLUCAP 228 (H) 07/27/2022   HGBA1C 7.9 (A) 05/31/2022    Review of Glycemic Control  Latest Reference Range & Units 07/26/22 17:06 07/26/22 20:02 07/26/22 21:02 07/27/22 07:41 07/27/22 08:20 07/27/22 12:03  Glucose-Capillary 70 - 99 mg/dL 89 60 (L) 117 (H) 67 (L) 104 (H) 228 (H)   Diabetes history: DM 2 Outpatient Diabetes medications:  Regular 3 units tid with meals, Toujeo 60 units daily Current orders for Inpatient glycemic control:  Novolog 0-9 units tid with meals Semglee 4 units q HS Inpatient Diabetes Program Recommendations:   Agree with current orders.  No recommendations at this time.   Thanks,  Adah Perl, RN, BC-ADM Inpatient Diabetes Coordinator Pager [phone]  (8a-5p)

## 2022-07-27 NOTE — Plan of Care (Signed)

## 2022-07-27 NOTE — Progress Notes (Signed)
  Progress Note   Patient: Shaun Odonnell DJT:[id number] DOB: [birth year] DOA: 07/26/2022     1 DOS: the patient was seen and examined on 07/27/2022   Brief hospital course: Shaun Odonnell is a 56 y.o. male with medical history significant of class I obesity, history of COPD, chronic pancreatitis (alcohol induced), history of alcohol abuse (has been alcohol free for the last week prior to admission), type 2 diabetes mellitus, hyperlipidemia, anxiety/depression and fatty liver; who presented to the emergency department secondary to abdominal pain with associated nausea/vomiting.  CT scan showed acute on chronic pancreatitis with stable 3.2 cm pancreatic pseudocyst in pancreatic tail. New 3 cm poorly defined low-attenuation area between the pancreatic uncinate process and transverse duodenum. Patient is admitted to the hospital for symptomatic treatment.  Assessment and Plan: Acute on chronic pancreatitis ([place]) Pancreatic pseudocysts. Patient with history of chronic pancreatitis with recurrent acute pancreatitis.  He was treated in [facility], previously had a stent placed in the pancreatic duct, and then removed.  He still has recurrent pancreatitis after that.  He had a history of alcohol abuse, but has not been drinking lately.  Most recent triglycerides was normal. I will continue IV fluids, symptom control.  I will also start clear liquid diet.  Continue pancreatic enzymes and Protonix. Patient can follow-up with general surgery as outpatient for pancreatic pseudocyst.   COPD Currently stable, no exacerbation.   Type 2 dependent diabetes mellitus with hypoglycemia. - Recent A1c 7.9 Patient had hypoglycemia this morning, added D5 to fluids.    Anxiety and depression Resume home medicines.   Gastroesophageal reflux disease with esophagitis without hemorrhage On Protonix twice a day.  Bipolar depression ([place]) Stable.      Subjective:  Patient still complaining significant  abdominal pain and abdominal distention.  He does not have chronic diarrhea.  He was nauseous, no vomiting.  Physical Exam: Vitals:   07/26/22 1529 07/26/22 1727 07/26/22 2002 07/27/22 0450  BP: (!) 142/71 (!) 156/79 (!) 164/87 129/64  Pulse: 60 (!) 53 66 73  Resp: '16 16 18 17  '$ Temp: 98.2 F (36.8 C) 98.1 F (36.7 C) 97.9 F (36.6 C) 99 F (37.2 C)  TempSrc: Axillary Axillary    SpO2: 95% 100% 96% 91%  Weight:      Height:       General exam: Appears calm and comfortable  Respiratory system: Clear to auscultation. Respiratory effort normal. Cardiovascular system: S1 & S2 heard, RRR. No JVD, murmurs, rubs, gallops or clicks. No pedal edema. Gastrointestinal system: Abdomen is distended, soft and  gastric tender. No organomegaly or masses felt. Normal bowel sounds heard. Central nervous system: Alert and oriented. No focal neurological deficits. Extremities: Symmetric 5 x 5 power. Skin: No rashes, lesions or ulcers Psychiatry: Judgement and insight appear normal. Mood & affect appropriate.   Data Reviewed:  Reviewed CT abdomen/pelvis, ultrasound results, lab results.  Family Communication: None  Disposition: Status is: Inpatient Remains inpatient appropriate because: Severity of disease, IV treatment.  Planned Discharge Destination: Home    Time spent: 55 minutes  Author: Sharen Hones, MD 07/27/2022 11:36 AM  For on call review [email or web address].

## 2022-07-27 NOTE — Progress Notes (Signed)
Pt.  is concerned about his abdominal distention and severe back pain and is worried that something may have been missed on his CT scan and asked if an ultrasound could be done. Dr. Sidney Ace paged for orders.

## 2022-07-28 DIAGNOSIS — K863 Pseudocyst of pancreas: Secondary | ICD-10-CM | POA: Diagnosis not present

## 2022-07-28 DIAGNOSIS — K861 Other chronic pancreatitis: Secondary | ICD-10-CM | POA: Diagnosis not present

## 2022-07-28 DIAGNOSIS — E871 Hypo-osmolality and hyponatremia: Secondary | ICD-10-CM

## 2022-07-28 DIAGNOSIS — F319 Bipolar disorder, unspecified: Secondary | ICD-10-CM | POA: Diagnosis not present

## 2022-07-28 DIAGNOSIS — K859 Acute pancreatitis without necrosis or infection, unspecified: Secondary | ICD-10-CM | POA: Diagnosis not present

## 2022-07-28 LAB — BASIC METABOLIC PANEL
Anion gap: 16 — ABNORMAL HIGH (ref 5–15)
BUN: 7 mg/dL (ref 6–20)
CO2: 22 mmol/L (ref 22–32)
Calcium: 9 mg/dL (ref 8.9–10.3)
Chloride: 96 mmol/L — ABNORMAL LOW (ref 98–111)
Creatinine, Ser: 0.65 mg/dL (ref 0.61–1.24)
GFR, Estimated: >60 mL/min (ref >60)
Glucose, Bld: 111 mg/dL — ABNORMAL HIGH (ref 70–99)
Potassium: 3.7 mmol/L (ref 3.5–5.1)
Sodium: 134 mmol/L — ABNORMAL LOW (ref 135–145)

## 2022-07-28 LAB — GLUCOSE, CAPILLARY
Glucose-Capillary: 109 mg/dL — ABNORMAL HIGH (ref 70–99)
Glucose-Capillary: 144 mg/dL — ABNORMAL HIGH (ref 70–99)
Glucose-Capillary: 255 mg/dL — ABNORMAL HIGH (ref 70–99)
Glucose-Capillary: 231 mg/dL — ABNORMAL HIGH (ref 70–99)

## 2022-07-28 LAB — PHOSPHORUS: Phosphorus: 2.8 mg/dL (ref 2.5–4.6)

## 2022-07-28 LAB — MAGNESIUM: Magnesium: 2 mg/dL (ref 1.7–2.4)

## 2022-07-28 MED ORDER — ONDANSETRON 4 MG PO TBDP
4.0000 mg | ORAL_TABLET | Freq: Four times a day (QID) | ORAL | Status: DC | PRN
  Administered 2022-07-28 – 2022-07-29 (×2): 4 mg via ORAL
  Filled 2022-07-28 (×3): qty 1

## 2022-07-28 MED ORDER — LACTULOSE 10 GM/15ML PO SOLN
20.0000 g | Freq: Once | ORAL | Status: AC
  Administered 2022-07-28: 20 g via ORAL
  Filled 2022-07-28: qty 30

## 2022-07-28 MED ORDER — SODIUM CHLORIDE 0.9 % IV SOLN: INTRAVENOUS | Status: DC

## 2022-07-28 NOTE — Progress Notes (Signed)
  Progress Note   Patient: Shaun Odonnell ZJQ:[id number] DOB: 1966-09-16 DOA: 07/26/2022     2 DOS: the patient was seen and examined on 07/28/2022   Brief hospital course: DEAKON FRIX is a 56 y.o. male with medical history significant of class I obesity, history of COPD, chronic pancreatitis (alcohol induced), history of alcohol abuse (has been alcohol free for the last week prior to admission), type 2 diabetes mellitus, hyperlipidemia, anxiety/depression and fatty liver; who presented to the emergency department secondary to abdominal pain with associated nausea/vomiting.  CT scan showed acute on chronic pancreatitis with stable 3.2 cm pancreatic pseudocyst in pancreatic tail. New 3 cm poorly defined low-attenuation area between the pancreatic uncinate process and transverse duodenum. Patient is admitted to the hospital for symptomatic treatment.  Assessment and Plan: Acute on chronic pancreatitis ([place]) Pancreatic pseudocysts. Hyponatremia. Patient with history of chronic pancreatitis with recurrent acute pancreatitis.  He was treated in [facility], previously had a stent placed in the pancreatic duct, and then removed.  He still has recurrent pancreatitis after that.  He had a history of alcohol abuse, but has not been drinking lately.  Most recent triglycerides was normal. He is still complaining of abdominal pain and nausea.  Was able to tolerate a clear liquid.  At this point, I will advance to soft diet, continue fluids.  She has mild hyponatremia after giving lactated Ringer, will change to normal saline. I have discussed with the patient, I will need to discontinue IV Dilaudid by tomorrow, and continue oral pain medicine after. Patient is also on Creon as well as Protonix.   COPD Currently stable, no exacerbation.   Type 2 dependent diabetes mellitus with hypoglycemia. - Recent A1c 7.9 Patient no longer has any hypoglycemia, tolerating diet.  I will discontinue D5.  Continue  normal saline for now.    Anxiety and depression Resume home medicines.   Gastroesophageal reflux disease with esophagitis without hemorrhage On Protonix twice a day.   Bipolar depression ([place]) Stable.      Subjective:  Patient still complaining of nausea, no vomiting.  Still complaining intermittent abdominal pain.  Tolerating liquid diet.  Physical Exam: Vitals:   07/27/22 1947 07/28/22 0443 07/28/22 0815 07/28/22 1147  BP: (!) 150/104 (!) 162/82 (!) 152/74 (!) 150/78  Pulse: 76 78 85 75  Resp: '20 18 16 17  '$ Temp: 98 F (36.7 C) 98.7 F (37.1 C) 98.8 F (37.1 C) 98 F (36.7 C)  TempSrc:      SpO2: 95% 94% 92% 93%  Weight:      Height:       General exam: Appears calm and comfortable  Respiratory system: Clear to auscultation. Respiratory effort normal. Cardiovascular system: S1 & S2 heard, RRR. No JVD, murmurs, rubs, gallops or clicks. No pedal edema. Gastrointestinal system: Abdomen is nondistended, soft and nontender. No organomegaly or masses felt. Normal bowel sounds heard. Central nervous system: Alert and oriented. No focal neurological deficits. Extremities: Symmetric 5 x 5 power. Skin: No rashes, lesions or ulcers Psychiatry: Judgement and insight appear normal. Mood & affect appropriate.   Data Reviewed:  Lab results reviewed.  Family Communication:   Disposition: Status is: Inpatient Remains inpatient appropriate because: Severity of disease, IV treatment.  Planned Discharge Destination: Home    Time spent: 35 minutes  Author: Sharen Hones, MD 07/28/2022 1:46 PM  For on call review [email or web address].

## 2022-07-29 DIAGNOSIS — F319 Bipolar disorder, unspecified: Secondary | ICD-10-CM | POA: Diagnosis not present

## 2022-07-29 DIAGNOSIS — K859 Acute pancreatitis without necrosis or infection, unspecified: Secondary | ICD-10-CM | POA: Diagnosis not present

## 2022-07-29 DIAGNOSIS — K861 Other chronic pancreatitis: Secondary | ICD-10-CM | POA: Diagnosis not present

## 2022-07-29 DIAGNOSIS — K863 Pseudocyst of pancreas: Secondary | ICD-10-CM | POA: Diagnosis not present

## 2022-07-29 LAB — BASIC METABOLIC PANEL
Anion gap: 14 (ref 5–15)
BUN: 5 mg/dL — ABNORMAL LOW (ref 6–20)
CO2: 23 mmol/L (ref 22–32)
Calcium: 8.5 mg/dL — ABNORMAL LOW (ref 8.9–10.3)
Chloride: 96 mmol/L — ABNORMAL LOW (ref 98–111)
Creatinine, Ser: 0.69 mg/dL (ref 0.61–1.24)
GFR, Estimated: >60 mL/min (ref >60)
Glucose, Bld: 135 mg/dL — ABNORMAL HIGH (ref 70–99)
Potassium: 4.2 mmol/L (ref 3.5–5.1)
Sodium: 133 mmol/L — ABNORMAL LOW (ref 135–145)

## 2022-07-29 LAB — MAGNESIUM: Magnesium: 2 mg/dL (ref 1.7–2.4)

## 2022-07-29 LAB — GLUCOSE, CAPILLARY
Glucose-Capillary: 102 mg/dL — ABNORMAL HIGH (ref 70–99)
Glucose-Capillary: 183 mg/dL — ABNORMAL HIGH (ref 70–99)
Glucose-Capillary: 189 mg/dL — ABNORMAL HIGH (ref 70–99)
Glucose-Capillary: 218 mg/dL — ABNORMAL HIGH (ref 70–99)

## 2022-07-29 LAB — PHOSPHORUS: Phosphorus: 2.4 mg/dL — ABNORMAL LOW (ref 2.5–4.6)

## 2022-07-29 MED ORDER — OXYCODONE HCL 5 MG PO TABS
5.0000 mg | ORAL_TABLET | Freq: Once | ORAL | Status: AC
  Administered 2022-07-29: 5 mg via ORAL
  Filled 2022-07-29: qty 1

## 2022-07-29 MED ORDER — HYDROMORPHONE HCL 1 MG/ML IJ SOLN
1.0000 mg | Freq: Once | INTRAMUSCULAR | Status: AC
  Administered 2022-07-29: 1 mg via INTRAVENOUS
  Filled 2022-07-29: qty 1

## 2022-07-29 MED ORDER — BISACODYL 5 MG PO TBEC
5.0000 mg | DELAYED_RELEASE_TABLET | Freq: Every day | ORAL | Status: DC | PRN
  Administered 2022-07-29: 5 mg via ORAL
  Filled 2022-07-29: qty 1

## 2022-07-29 MED ORDER — POLYETHYLENE GLYCOL 3350 17 G PO PACK
17.0000 g | PACK | Freq: Every day | ORAL | Status: DC
  Administered 2022-07-29: 17 g via ORAL
  Filled 2022-07-29: qty 1

## 2022-07-29 MED ORDER — LACTULOSE 10 GM/15ML PO SOLN
20.0000 g | Freq: Once | ORAL | Status: AC
  Administered 2022-07-29: 20 g via ORAL
  Filled 2022-07-29: qty 30

## 2022-07-29 MED ORDER — POTASSIUM & SODIUM PHOSPHATES 280-160-250 MG PO PACK
1.0000 | PACK | Freq: Three times a day (TID) | ORAL | Status: DC
  Administered 2022-07-29 (×3): 1 via ORAL
  Filled 2022-07-29 (×4): qty 1

## 2022-07-29 MED ORDER — OXYCODONE HCL 5 MG PO TABS
15.0000 mg | ORAL_TABLET | ORAL | Status: DC | PRN
  Administered 2022-07-29 – 2022-07-30 (×4): 15 mg via ORAL
  Filled 2022-07-29 (×4): qty 3

## 2022-07-29 MED ORDER — MAGNESIUM HYDROXIDE 400 MG/5ML PO SUSP: 30.0000 mL | Freq: Every day | ORAL | Status: DC | PRN

## 2022-07-29 MED ORDER — FLEET ENEMA 7-19 GM/118ML RE ENEM: 1.0000 | ENEMA | Freq: Every day | RECTAL | Status: DC | PRN

## 2022-07-29 MED ORDER — OXYCODONE HCL 5 MG PO TABS
10.0000 mg | ORAL_TABLET | ORAL | Status: DC | PRN
  Administered 2022-07-29: 10 mg via ORAL
  Filled 2022-07-29: qty 2

## 2022-07-29 NOTE — Progress Notes (Signed)
  Progress Note   Patient: Shaun Odonnell OQH:[id number] DOB: Feb 23, 1966 DOA: 07/26/2022     3 DOS: the patient was seen and examined on 07/29/2022   Brief hospital course: Shaun Odonnell is a 56 y.o. male with medical history significant of class I obesity, history of COPD, chronic pancreatitis (alcohol induced), history of alcohol abuse (has been alcohol free for the last week prior to admission), type 2 diabetes mellitus, hyperlipidemia, anxiety/depression and fatty liver; who presented to the emergency department secondary to abdominal pain with associated nausea/vomiting.  CT scan showed acute on chronic pancreatitis with stable 3.2 cm pancreatic pseudocyst in pancreatic tail. New 3 cm poorly defined low-attenuation area between the pancreatic uncinate process and transverse duodenum. Patient is admitted to the hospital for symptomatic treatment.  Assessment and Plan: Acute on chronic pancreatitis ([place]) Pancreatic pseudocysts. Constipation. Patient with history of chronic pancreatitis with recurrent acute pancreatitis.  He was treated in [facility], previously had a stent placed in the pancreatic duct, and then removed.  He still has recurrent pancreatitis after that.  He had a history of alcohol abuse, but has not been drinking lately.  Most recent triglycerides was normal. Patient was started on soft diet yesterday, he seems to be tolerating better.  But still complaining of severe abdominal pain. I discontinued Dilaudid as discussed with him yesterday, increased dose of oral oxycodone as needed. Patient had increased constipation, started lactulose. Patient had adequate p.o. intake, discontinue IV fluids.  Hyponatremia. Hypophosphatemia. Patient has a mild hypophosphatemia, will give oral.   COPD Currently stable, no exacerbation.   Type 2 dependent diabetes mellitus with hypoglycemia. - Recent A1c 7.9 Continue current sliding scale insulin regimen.    Anxiety and  depression Resume home medicines.   Gastroesophageal reflux disease with esophagitis without hemorrhage On Protonix twice a day.   Bipolar depression ([place]) Stable.      Subjective:  Patient still complaining of nausea, no vomiting.  Tolerating soft diet. Still complaining of abdominal pain, intermittent.  Physical Exam: Vitals:   07/28/22 1147 07/28/22 1645 07/28/22 2103 07/29/22 0608  BP: (!) 150/78 (!) 151/72 (!) 141/78 (!) 141/75  Pulse: 75 76 79 71  Resp: '17 16 18 16  '$ Temp: 98 F (36.7 C) 98.5 F (36.9 C) 97.8 F (36.6 C) 98.7 F (37.1 C)  TempSrc:  Oral Oral   SpO2: 93% 94% 92% 97%  Weight:      Height:       General exam: Appears calm and comfortable  Respiratory system: Clear to auscultation. Respiratory effort normal. Cardiovascular system: S1 & S2 heard, RRR. No JVD, murmurs, rubs, gallops or clicks. No pedal edema. Gastrointestinal system: Abdomen is nondistended, soft and nontender. No organomegaly or masses felt. Normal bowel sounds heard. Central nervous system: Alert and oriented. No focal neurological deficits. Extremities: Symmetric 5 x 5 power. Skin: No rashes, lesions or ulcers Psychiatry: Judgement and insight appear normal. Mood & affect appropriate.   Data Reviewed:  Lab results reviewed.  Family Communication: None  Disposition: Status is: Inpatient Remains inpatient appropriate because: Severity of disease, symptomatic control.  Planned Discharge Destination: Home    Time spent: 35 minutes  Author: Sharen Hones, MD 07/29/2022 12:30 PM  For on call review [email or web address].

## 2022-07-29 NOTE — Progress Notes (Signed)
Patient remains alert and oriented x4. Complains of abdominal pain frequently. Concerned that patient is focused on the frequency that he is allowed to have pain meds rather than allowing the dose of pain medicine given to work. For instance, patient asks for next dose of pain meds as I am administering a dose of medication. Patient states that he does not want to come off of dilaudid. States that doctor told him that the plan was to transition off of dilaudid in 24 hours. Patient stated that if he transitions off of dilaudid, then his oxycodone dose needs to be increased. Informed patient to consult with morning provider regarding concerns about medications. I educated patient regarding his transition home and IV medications. Patient states that everytime he receives IV and po meds, he feels a shooting pain in abdomen again. Stated that he informed MD. Assessment of patient did not reveal any new findings. Gave zofran due to patient complaining  of nausea w/o vomiting. Receives pain meds frequently when awake. He denied additional needs.

## 2022-07-29 NOTE — Progress Notes (Signed)
Patient c/o sever pain after being taken off of IV pain medication this morning. Dr. Roosevelt Locks paged about patient's c.o pain. Dr. Roosevelt Locks gave a verbal one time order for '1mg'$  Dilaudid. Also instructed that he must wait for a full 3 hours after dilaudid to receive his next oxycodone dose.

## 2022-07-29 NOTE — Progress Notes (Signed)
   07/29/22 1220  Provider Notification  Provider Name/Title Dr. Roosevelt Locks  Date Provider Notified 07/29/22  Time Provider Notified 1220  Method of Notification Page  Notification Reason Requested by patient/family (patient still in pain. requesting additional pain medication.)  Provider response See new orders (give extra '5mg'$  oxy now, increased dose to 3 tablets every 3 hours.)  Date of Provider Response 07/29/22  Time of Provider Response 1220

## 2022-07-30 ENCOUNTER — Encounter: Payer: Self-pay | Admitting: Emergency Medicine

## 2022-07-30 ENCOUNTER — Other Ambulatory Visit: Payer: Self-pay

## 2022-07-30 ENCOUNTER — Telehealth (HOSPITAL_BASED_OUTPATIENT_CLINIC_OR_DEPARTMENT_OTHER): Payer: Self-pay | Admitting: *Deleted

## 2022-07-30 DIAGNOSIS — Z5321 Procedure and treatment not carried out due to patient leaving prior to being seen by health care provider: Secondary | ICD-10-CM | POA: Diagnosis not present

## 2022-07-30 DIAGNOSIS — R1010 Upper abdominal pain, unspecified: Secondary | ICD-10-CM | POA: Diagnosis not present

## 2022-07-30 DIAGNOSIS — R110 Nausea: Secondary | ICD-10-CM | POA: Diagnosis not present

## 2022-07-30 NOTE — Progress Notes (Signed)
Patient called the work phone and stated "I have discharged myself". When asked where he was, he stated "I drove myself home because I felt disrespected by some staff and uncomfortable in the bed". Wanted to call and remind me to have the MD call his pain medication script to his pharmacy. MD made aware. Charge made aware.

## 2022-07-30 NOTE — Discharge Summary (Signed)
  Physician Discharge Summary   Patient: Shaun Odonnell MRN: [id number] DOB: Jul 14, 1966  Admit date:     07/26/2022  Discharge date: 07/30/22  Discharge Physician: Sharen Hones   PCP: [facility]   Patient left hospital against medical advice. Please see RN and NP note early this morning. Upon starting my shift, I have asked unit secretary to notify police to check on him to see if he still has peripheral IV line. Need to remove it if he still has it.   Signed: Sharen Hones, MD [facility] 07/30/2022

## 2022-07-30 NOTE — Progress Notes (Addendum)
       CROSS COVER NOTE  NAME: Shaun Odonnell MRN: [id number] DOB : 06-Sep-1966                                                   Against Medical Advice  Notified by nursing staff that [name] called and informed RN that he left care and drove himself home. RN was last in room around 0330. Earlier tonight [name] requested IV dilaudid be added back to his [facility] q3H PRN, at which time I declined based on documented pain scores showing improved pain relief/lower pain scores tonight on increased dose of PO Oxycodone. RN reports patient is requesting medical staff call him to discuss pain medication, patient will need to reach out to PCP for any prescriptions needs after eloping from care. There was no opportunity to get patient to sign AMA form or to provide warning that leaving care is against medical advice.    Neomia Glass DNP, MBA, FNP-BC Nurse Practitioner [place]

## 2022-07-30 NOTE — ED Triage Notes (Signed)
Patient ambulatory to triage with steady gait, without difficulty or distress noted; pt reports upper abd pain since Thursday accomp by nausea; hx of same with pancreatitis

## 2022-07-30 NOTE — Telephone Encounter (Signed)
Contacted via phone by patient who states he discharged himself from the hospital this morning and is requesting pain medication.  Advised patient that pain medication will not be provided.

## 2022-07-31 ENCOUNTER — Emergency Department
Admission: EM | Admit: 2022-07-31 | Discharge: 2022-07-31 | Discharge status: Against Medical Advice | Payer: Medicare Other | Attending: Emergency Medicine | Admitting: Emergency Medicine

## 2022-07-31 DIAGNOSIS — K861 Other chronic pancreatitis: Secondary | ICD-10-CM | POA: Diagnosis not present

## 2022-07-31 DIAGNOSIS — R748 Abnormal levels of other serum enzymes: Secondary | ICD-10-CM | POA: Diagnosis not present

## 2022-07-31 DIAGNOSIS — Z87891 Personal history of nicotine dependence: Secondary | ICD-10-CM | POA: Diagnosis not present

## 2022-07-31 DIAGNOSIS — F419 Anxiety disorder, unspecified: Secondary | ICD-10-CM | POA: Insufficient documentation

## 2022-07-31 DIAGNOSIS — E871 Hypo-osmolality and hyponatremia: Secondary | ICD-10-CM | POA: Diagnosis not present

## 2022-07-31 DIAGNOSIS — E785 Hyperlipidemia, unspecified: Secondary | ICD-10-CM | POA: Diagnosis not present

## 2022-07-31 DIAGNOSIS — R112 Nausea with vomiting, unspecified: Secondary | ICD-10-CM | POA: Diagnosis not present

## 2022-07-31 DIAGNOSIS — K863 Pseudocyst of pancreas: Secondary | ICD-10-CM | POA: Diagnosis not present

## 2022-07-31 DIAGNOSIS — E119 Type 2 diabetes mellitus without complications: Secondary | ICD-10-CM | POA: Diagnosis not present

## 2022-07-31 DIAGNOSIS — R110 Nausea: Secondary | ICD-10-CM | POA: Diagnosis not present

## 2022-07-31 DIAGNOSIS — Z881 Allergy status to other antibiotic agents status: Secondary | ICD-10-CM | POA: Diagnosis not present

## 2022-07-31 DIAGNOSIS — R053 Chronic cough: Secondary | ICD-10-CM | POA: Diagnosis not present

## 2022-07-31 DIAGNOSIS — Z79891 Long term (current) use of opiate analgesic: Secondary | ICD-10-CM | POA: Diagnosis not present

## 2022-07-31 DIAGNOSIS — R1084 Generalized abdominal pain: Secondary | ICD-10-CM | POA: Diagnosis not present

## 2022-07-31 DIAGNOSIS — I10 Essential (primary) hypertension: Secondary | ICD-10-CM | POA: Diagnosis not present

## 2022-07-31 DIAGNOSIS — Z885 Allergy status to narcotic agent status: Secondary | ICD-10-CM | POA: Diagnosis not present

## 2022-07-31 DIAGNOSIS — K838 Other specified diseases of biliary tract: Secondary | ICD-10-CM | POA: Diagnosis not present

## 2022-07-31 DIAGNOSIS — Z794 Long term (current) use of insulin: Secondary | ICD-10-CM | POA: Diagnosis not present

## 2022-07-31 DIAGNOSIS — G894 Chronic pain syndrome: Secondary | ICD-10-CM | POA: Diagnosis not present

## 2022-07-31 DIAGNOSIS — R1010 Upper abdominal pain, unspecified: Secondary | ICD-10-CM | POA: Diagnosis not present

## 2022-07-31 DIAGNOSIS — R000 Tachycardia, unspecified: Secondary | ICD-10-CM | POA: Diagnosis not present

## 2022-07-31 DIAGNOSIS — F1090 Alcohol use, unspecified, uncomplicated: Secondary | ICD-10-CM | POA: Diagnosis not present

## 2022-07-31 DIAGNOSIS — Z5321 Procedure and treatment not carried out due to patient leaving prior to being seen by health care provider: Secondary | ICD-10-CM | POA: Diagnosis not present

## 2022-07-31 DIAGNOSIS — Z79899 Other long term (current) drug therapy: Secondary | ICD-10-CM | POA: Diagnosis not present

## 2022-07-31 DIAGNOSIS — R319 Hematuria, unspecified: Secondary | ICD-10-CM | POA: Diagnosis not present

## 2022-07-31 DIAGNOSIS — K859 Acute pancreatitis without necrosis or infection, unspecified: Secondary | ICD-10-CM | POA: Diagnosis not present

## 2022-07-31 LAB — COMPREHENSIVE METABOLIC PANEL
ALT: 74 U/L — ABNORMAL HIGH (ref 0–44)
AST: 21 U/L (ref 15–41)
Albumin: 4 g/dL (ref 3.5–5.0)
Alkaline Phosphatase: 407 U/L — ABNORMAL HIGH (ref 38–126)
Anion gap: 21 — ABNORMAL HIGH (ref 5–15)
BUN: 8 mg/dL (ref 6–20)
CO2: 20 mmol/L — ABNORMAL LOW (ref 22–32)
Calcium: 9.6 mg/dL (ref 8.9–10.3)
Chloride: 92 mmol/L — ABNORMAL LOW (ref 98–111)
Creatinine, Ser: 1.06 mg/dL (ref 0.61–1.24)
GFR, Estimated: >60 mL/min (ref >60)
Glucose, Bld: 189 mg/dL — ABNORMAL HIGH (ref 70–99)
Potassium: 4.3 mmol/L (ref 3.5–5.1)
Sodium: 133 mmol/L — ABNORMAL LOW (ref 135–145)
Total Bilirubin: 2.4 mg/dL — ABNORMAL HIGH (ref 0.3–1.2)
Total Protein: 8.8 g/dL — ABNORMAL HIGH (ref 6.5–8.1)

## 2022-07-31 LAB — ETHANOL: Alcohol, Ethyl (B): <10 mg/dL (ref <10)

## 2022-07-31 LAB — URINALYSIS, ROUTINE W REFLEX MICROSCOPIC
Bacteria, UA: NONE SEEN
Bilirubin Urine: NEGATIVE
Glucose, UA: >=500 mg/dL — AB
Hgb urine dipstick: NEGATIVE
Ketones, ur: 80 mg/dL — AB
Leukocytes,Ua: NEGATIVE
Nitrite: NEGATIVE
Protein, ur: 100 mg/dL — AB
Specific Gravity, Urine: 1.029 (ref 1.005–1.030)
pH: 5 (ref 5.0–8.0)

## 2022-07-31 LAB — CBC WITH DIFFERENTIAL/PLATELET
Abs Immature Granulocytes: 0.27 10*3/uL — ABNORMAL HIGH (ref 0.00–0.07)
Basophils Absolute: 0.1 10*3/uL (ref 0.0–0.1)
Basophils Relative: 1 %
Eosinophils Absolute: 0.1 10*3/uL (ref 0.0–0.5)
Eosinophils Relative: 1 %
HCT: 48.1 % (ref 39.0–52.0)
Hemoglobin: 15.4 g/dL (ref 13.0–17.0)
Immature Granulocytes: 2 %
Lymphocytes Relative: 10 %
Lymphs Abs: 1.1 10*3/uL (ref 0.7–4.0)
MCH: 29.5 pg (ref 26.0–34.0)
MCHC: 32 g/dL (ref 30.0–36.0)
MCV: 92.1 fL (ref 80.0–100.0)
Monocytes Absolute: 1.1 10*3/uL — ABNORMAL HIGH (ref 0.1–1.0)
Monocytes Relative: 10 %
Neutro Abs: 8.8 10*3/uL — ABNORMAL HIGH (ref 1.7–7.7)
Neutrophils Relative %: 76 %
Platelets: 358 10*3/uL (ref 150–400)
RBC: 5.22 MIL/uL (ref 4.22–5.81)
RDW: 13 % (ref 11.5–15.5)
WBC: 11.4 10*3/uL — ABNORMAL HIGH (ref 4.0–10.5)
nRBC: 0 % (ref 0.0–0.2)

## 2022-07-31 LAB — LIPASE, BLOOD: Lipase: 36 U/L (ref 11–51)

## 2022-07-31 NOTE — ED Notes (Signed)
No answer when called several times from lobby 

## 2022-08-01 DIAGNOSIS — R319 Hematuria, unspecified: Secondary | ICD-10-CM | POA: Insufficient documentation

## 2022-08-02 ENCOUNTER — Telehealth: Payer: Self-pay

## 2022-08-02 NOTE — Telephone Encounter (Signed)
Copied from [place] [id number]. Topic: Appointment Scheduling - Scheduling Inquiry for Clinic >> Aug 02, 2022  3:09 PM [place] B wrote: Reason for CRM: Mitzi Hansen from [facility] trying to schedule hosp fu, Patient isnt discharged yet from [facility]. Please call patient back to see when he can schedule

## 2022-08-02 NOTE — Telephone Encounter (Signed)
Called the number that was provided.  Stated that they were a physician.  Advised that he is not a patient at [facility].  He is still a pt at [facility] as far as we can see.

## 2022-08-03 ENCOUNTER — Telehealth: Payer: Self-pay | Admitting: *Deleted

## 2022-08-03 NOTE — Patient Outreach (Signed)
  Care Coordination [facility] Note Transition Care Management Unsuccessful Follow-up Telephone Call  Date of discharge and from where:  [phone]  Attempts:  1st Attempt  Reason for unsuccessful TCM follow-up call:  Left voice message  [facility] [phone]

## 2022-08-07 ENCOUNTER — Other Ambulatory Visit: Payer: Self-pay

## 2022-08-07 ENCOUNTER — Ambulatory Visit: Payer: Medicare Other | Admitting: Gastroenterology

## 2022-08-13 ENCOUNTER — Ambulatory Visit
Admission: RE | Admit: 2022-08-13 | Discharge: 2022-08-13 | Discharge status: Against Medical Advice | Payer: Medicare Other | Source: Ambulatory Visit

## 2022-08-13 ENCOUNTER — Other Ambulatory Visit: Payer: Self-pay | Admitting: Nurse Practitioner

## 2022-08-14 ENCOUNTER — Ambulatory Visit
Admission: RE | Admit: 2022-08-14 | Discharge: 2022-08-14 | Discharge status: Against Medical Advice | Payer: Medicare Other | Source: Ambulatory Visit

## 2022-08-14 NOTE — ED Triage Notes (Signed)
Pt has an appointment with pain management with Dr. Mohammed Kindle in [place] on Monday 08/20/22.  Pt states that on 07/31/22 he had blood in his urine but upon discharge on 08/02/22 he had no blood in his urine. Pt denies seeing any blood while at home.

## 2022-08-14 NOTE — ED Notes (Signed)
Patient is being discharged from the Urgent Care and sent to the Emergency Department via Personal Vehicle . Per Jerene Pitch, [place], patient is in need of higher level of care due to Pancreatitis. Patient is aware and verbalizes understanding of plan of care. There were no vitals filed for this visit.

## 2022-08-14 NOTE — ED Triage Notes (Signed)
Pt was discharged from [facility] on 08/02/22 for a Psudeocyst due to chronic pancreatitis.   Pt states that he has continued pain in the upper left quadrant with nausea and vomiting.  Pt is asking for an extension of medication for pain - Oxycodone HCl 10.  Pt has a upcoming PCP appointments in December 2023.

## 2022-08-14 NOTE — ED Notes (Signed)
Pt presented today with a history of Chronic Pancreatitis and asked for a week long refill of Pain Medication - Oxycodone. Pt sees pain management on Monday and has a PCP appointment in December. Explained to the patient that our office does not refill medication to that degree and informed him that we do not carry pain medication here at this site. Spoke with [place], [place], and explained to the patient that she is concerned for the higher level of care and that the patient will need further workup than what we can provide at this urgent care. Pt was advised to be seen at the [facility] ER. Pt verbalized understanding and states that he will return to the [facility] for a lessened wait time. Pt showed apprehension at returning to the hospital due to waiting and possible results, but agreed to go. Pt had no further questions and verbalized understanding of instructions.

## 2022-08-16 DIAGNOSIS — R1011 Right upper quadrant pain: Secondary | ICD-10-CM | POA: Diagnosis not present

## 2022-08-16 DIAGNOSIS — Z5329 Procedure and treatment not carried out because of patient's decision for other reasons: Secondary | ICD-10-CM | POA: Diagnosis not present

## 2022-08-16 DIAGNOSIS — R1110 Vomiting, unspecified: Secondary | ICD-10-CM | POA: Diagnosis not present

## 2022-08-17 DIAGNOSIS — Z885 Allergy status to narcotic agent status: Secondary | ICD-10-CM | POA: Diagnosis not present

## 2022-08-17 DIAGNOSIS — Z794 Long term (current) use of insulin: Secondary | ICD-10-CM | POA: Diagnosis not present

## 2022-08-17 DIAGNOSIS — K859 Acute pancreatitis without necrosis or infection, unspecified: Secondary | ICD-10-CM | POA: Diagnosis not present

## 2022-08-17 DIAGNOSIS — R140 Abdominal distension (gaseous): Secondary | ICD-10-CM | POA: Diagnosis not present

## 2022-08-17 DIAGNOSIS — K838 Other specified diseases of biliary tract: Secondary | ICD-10-CM | POA: Diagnosis not present

## 2022-08-17 DIAGNOSIS — I2699 Other pulmonary embolism without acute cor pulmonale: Secondary | ICD-10-CM | POA: Diagnosis not present

## 2022-08-17 DIAGNOSIS — K8591 Acute pancreatitis with uninfected necrosis, unspecified: Secondary | ICD-10-CM | POA: Diagnosis not present

## 2022-08-17 DIAGNOSIS — E785 Hyperlipidemia, unspecified: Secondary | ICD-10-CM | POA: Diagnosis not present

## 2022-08-17 DIAGNOSIS — R1010 Upper abdominal pain, unspecified: Secondary | ICD-10-CM | POA: Diagnosis not present

## 2022-08-17 DIAGNOSIS — R112 Nausea with vomiting, unspecified: Secondary | ICD-10-CM | POA: Diagnosis not present

## 2022-08-17 DIAGNOSIS — E119 Type 2 diabetes mellitus without complications: Secondary | ICD-10-CM | POA: Diagnosis not present

## 2022-08-17 DIAGNOSIS — R110 Nausea: Secondary | ICD-10-CM | POA: Diagnosis not present

## 2022-08-17 DIAGNOSIS — Z79899 Other long term (current) drug therapy: Secondary | ICD-10-CM | POA: Diagnosis not present

## 2022-08-17 DIAGNOSIS — Z881 Allergy status to other antibiotic agents status: Secondary | ICD-10-CM | POA: Diagnosis not present

## 2022-08-17 DIAGNOSIS — K861 Other chronic pancreatitis: Secondary | ICD-10-CM | POA: Diagnosis not present

## 2022-08-17 DIAGNOSIS — R918 Other nonspecific abnormal finding of lung field: Secondary | ICD-10-CM | POA: Diagnosis not present

## 2022-08-18 DIAGNOSIS — K861 Other chronic pancreatitis: Secondary | ICD-10-CM | POA: Diagnosis not present

## 2022-08-18 DIAGNOSIS — I2609 Other pulmonary embolism with acute cor pulmonale: Secondary | ICD-10-CM | POA: Diagnosis not present

## 2022-08-18 DIAGNOSIS — K859 Acute pancreatitis without necrosis or infection, unspecified: Secondary | ICD-10-CM | POA: Diagnosis not present

## 2022-08-20 ENCOUNTER — Other Ambulatory Visit: Payer: Self-pay | Admitting: General Practice

## 2022-08-20 ENCOUNTER — Other Ambulatory Visit: Payer: Self-pay | Admitting: Nurse Practitioner

## 2022-08-20 DIAGNOSIS — R209 Unspecified disturbances of skin sensation: Secondary | ICD-10-CM | POA: Diagnosis not present

## 2022-08-20 DIAGNOSIS — G8929 Other chronic pain: Secondary | ICD-10-CM | POA: Diagnosis not present

## 2022-08-20 DIAGNOSIS — I499 Cardiac arrhythmia, unspecified: Secondary | ICD-10-CM | POA: Diagnosis not present

## 2022-08-20 DIAGNOSIS — Z833 Family history of diabetes mellitus: Secondary | ICD-10-CM | POA: Diagnosis not present

## 2022-08-20 DIAGNOSIS — Z79891 Long term (current) use of opiate analgesic: Secondary | ICD-10-CM | POA: Diagnosis not present

## 2022-08-20 DIAGNOSIS — R109 Unspecified abdominal pain: Secondary | ICD-10-CM | POA: Diagnosis not present

## 2022-08-20 DIAGNOSIS — E0840 Diabetes mellitus due to underlying condition with diabetic neuropathy, unspecified: Secondary | ICD-10-CM | POA: Diagnosis not present

## 2022-08-20 NOTE — Telephone Encounter (Signed)
Medication Refill - Medication: fenofibrate (TRICOR) 145 MG tablet  Has the patient contacted their pharmacy? Yes.   However, pt was dismissed from [facility] in August.  Pt has new pt appt at [facility] the end of December.  But he is out of this medication, and requesting a 90 day Rx be sent in to pharmacy to get him through to his appt.  Preferred Pharmacy (with phone number or street name): [facility] [place], [place].  Has the patient been seen for an appointment in the last year OR does the patient have an upcoming appointment? yes  Agent: Please be advised that RX refills may take up to 3 business days. We ask that you follow-up with your pharmacy.

## 2022-08-21 NOTE — Telephone Encounter (Signed)
Unable to refill per protocol, medication was refused, patient dismissed. Duplicate request. Will refuse.  Requested Prescriptions  Pending Prescriptions Disp Refills  . fenofibrate (TRICOR) 145 MG tablet 90 tablet 0    Sig: Take 1 tablet (145 mg total) by mouth daily.     Cardiovascular:  Antilipid - Fibric Acid Derivatives Failed - 08/20/2022 11:18 AM      Failed - ALT in normal range and within 360 days    ALT  Date Value Ref Range Status  07/31/2022 74 (H) 0 - 44 U/L Final   SGPT (ALT)  Date Value Ref Range Status  11/26/2014 75 (H) U/L Final    Comment:    14-63 NOTE: New Reference Range 05/25/14          Failed - WBC in normal range and within 360 days    WBC  Date Value Ref Range Status  07/31/2022 11.4 (H) 4.0 - 10.5 K/uL Final         Failed - Lipid Panel in normal range within the last 12 months    Cholesterol, Total  Date Value Ref Range Status  06/05/2022 200 (H) 100 - 199 mg/dL Final   Cholesterol  Date Value Ref Range Status  07/20/2022 113 0 - 200 mg/dL Final  08/03/2014 140 0 - 200 mg/dL Final   Ldl Cholesterol, Calc  Date Value Ref Range Status  08/03/2014 57 0 - 100 mg/dL Final   LDL Chol Calc (NIH)  Date Value Ref Range Status  06/05/2022 102 (H) 0 - 99 mg/dL Final   LDL Cholesterol  Date Value Ref Range Status  07/20/2022 42 0 - 99 mg/dL Final    Comment:           Total Cholesterol/HDL:CHD Risk Coronary Heart Disease Risk Table                     Men   Women  1/2 Average Risk   3.4   3.3  Average Risk       5.0   4.4  2 X Average Risk   9.6   7.1  3 X Average Risk  23.4   11.0        Use the calculated Patient Ratio above and the CHD Risk Table to determine the patient's CHD Risk.        ATP III CLASSIFICATION (LDL):  <100     mg/dL   Optimal  100-129  mg/dL   Near or Above                    Optimal  130-159  mg/dL   Borderline  160-189  mg/dL   High  >190     mg/dL   Very High Performed at [facility], [place], [place] [address]    HDL Cholesterol  Date Value Ref Range Status  08/03/2014 35 (L) 40 - 60 mg/dL Final   HDL  Date Value Ref Range Status  07/20/2022 47 >40 mg/dL Final  06/05/2022 54 >39 mg/dL Final   Triglycerides  Date Value Ref Range Status  07/20/2022 121 <150 mg/dL Final  08/03/2014 241 (H) 0 - 200 mg/dL Final         Passed - AST in normal range and within 360 days    AST  Date Value Ref Range Status  07/31/2022 21 15 - 41 U/L Final   SGOT(AST)  Date Value Ref Range Status  11/26/2014 50 (  H) 15 - 37 Unit/L Final         Passed - Cr in normal range and within 360 days    Creatinine  Date Value Ref Range Status  11/26/2014 0.79 0.60 - 1.30 mg/dL Final   Creatinine, Ser  Date Value Ref Range Status  07/31/2022 1.06 0.61 - 1.24 mg/dL Final         Passed - HGB in normal range and within 360 days    Hemoglobin  Date Value Ref Range Status  07/31/2022 15.4 13.0 - 17.0 g/dL Final  06/05/2022 15.8 13.0 - 17.7 g/dL Final         Passed - HCT in normal range and within 360 days    HCT  Date Value Ref Range Status  07/31/2022 48.1 39.0 - 52.0 % Final   Hematocrit  Date Value Ref Range Status  06/05/2022 48.7 37.5 - 51.0 % Final         Passed - PLT in normal range and within 360 days    Platelets  Date Value Ref Range Status  07/31/2022 358 150 - 400 K/uL Final  06/05/2022 253 150 - 450 x10E3/uL Final         Passed - eGFR is 30 or above and within 360 days    EGFR (African American)  Date Value Ref Range Status  11/26/2014 >60 >7m/min Final  06/15/2014 >60  Final   GFR calc Af Amer  Date Value Ref Range Status  12/13/2020 128 >59 mL/min/1.73 Final    Comment:    **In accordance with recommendations from the NKF-ASN Task force,**   [facility] is in the process of updating its eGFR calculation to the   2021 CKD-EPI creatinine equation that estimates kidney function   without a race variable.    EGFR (Non-African Amer.)   Date Value Ref Range Status  11/26/2014 >60 >656mmin Final    Comment:    eGFR values <6049min/1.73 m2 may be an indication of chronic kidney disease (CKD). Calculated eGFR, using the MRDR Study equation, is useful in  patients with stable renal function. The eGFR calculation will not be reliable in acutely ill patients when serum creatinine is changing rapidly. It is not useful in patients on dialysis. The eGFR calculation may not be applicable to patients at the low and high extremes of body sizes, pregnant women, and vegetarians.   06/15/2014 >60  Final    Comment:    eGFR values <82m64mn/1.73 m2 may be an indication of chronic kidney disease (CKD). Calculated eGFR is useful in patients with stable renal function. The eGFR calculation will not be reliable in acutely ill patients when serum creatinine is changing rapidly. It is not useful in  patients on dialysis. The eGFR calculation may not be applicable to patients at the low and high extremes of body sizes, pregnant women, and vegetarians.    GFR, Estimated  Date Value Ref Range Status  07/31/2022 >60 >60 mL/min Final    Comment:    (NOTE) Calculated using the CKD-EPI Creatinine Equation (2021)    eGFR  Date Value Ref Range Status  06/05/2022 99 >59 mL/min/1.73 Final         Passed - Valid encounter within last 12 months    Recent Outpatient Visits          2 months ago Type 2 diabetes mellitus with hyperglycemia, without long-term current use of insulin ([facility]   3 months ago  Acute cough   [facility] [place], [place] T, NP   5 months ago Other chronic pain   [facility] Vigg, Avanti, MD   7 months ago Visit for suture removal   [place], MD   8 months ago Diabetes mellitus without complication [facility])   [place] Vigg, Avanti, MD      Future Appointments            In 2 months Gwyneth Sprout, [place], [facility]

## 2022-08-22 ENCOUNTER — Telehealth: Payer: Self-pay | Admitting: General Practice

## 2022-08-22 NOTE — Telephone Encounter (Signed)
Patient has scheduled an appt through [facility] for a new pt appt in December.  He had also scheduled an appt for an ov on 08/23/2022 through mychart.  I called pt to cancel appt for tomorrow because he is no longer a pt here and would need to be seen as a new pt before a regular ov.  I advised pt that I would speak with the office manager to see if he is able to keep the new pt appt in Dec due to him leaving [facility] and establishing at [facility].  Please advise

## 2022-08-23 ENCOUNTER — Ambulatory Visit: Payer: Medicare Other | Admitting: Family Medicine

## 2022-08-24 ENCOUNTER — Other Ambulatory Visit: Payer: Self-pay | Admitting: *Deleted

## 2022-08-25 DIAGNOSIS — K8689 Other specified diseases of pancreas: Secondary | ICD-10-CM | POA: Diagnosis not present

## 2022-08-25 DIAGNOSIS — R9431 Abnormal electrocardiogram [ECG] [EKG]: Secondary | ICD-10-CM | POA: Diagnosis not present

## 2022-08-25 DIAGNOSIS — Z794 Long term (current) use of insulin: Secondary | ICD-10-CM | POA: Diagnosis not present

## 2022-08-25 DIAGNOSIS — K859 Acute pancreatitis without necrosis or infection, unspecified: Secondary | ICD-10-CM | POA: Diagnosis not present

## 2022-08-25 DIAGNOSIS — E119 Type 2 diabetes mellitus without complications: Secondary | ICD-10-CM | POA: Diagnosis not present

## 2022-08-25 DIAGNOSIS — R112 Nausea with vomiting, unspecified: Secondary | ICD-10-CM | POA: Diagnosis not present

## 2022-08-25 DIAGNOSIS — K861 Other chronic pancreatitis: Secondary | ICD-10-CM | POA: Diagnosis not present

## 2022-08-25 DIAGNOSIS — R319 Hematuria, unspecified: Secondary | ICD-10-CM | POA: Diagnosis not present

## 2022-08-25 DIAGNOSIS — R1013 Epigastric pain: Secondary | ICD-10-CM | POA: Diagnosis not present

## 2022-08-25 DIAGNOSIS — Z7901 Long term (current) use of anticoagulants: Secondary | ICD-10-CM | POA: Diagnosis not present

## 2022-08-25 DIAGNOSIS — R109 Unspecified abdominal pain: Secondary | ICD-10-CM | POA: Diagnosis not present

## 2022-08-25 DIAGNOSIS — M545 Low back pain, unspecified: Secondary | ICD-10-CM | POA: Diagnosis not present

## 2022-08-25 DIAGNOSIS — Z87891 Personal history of nicotine dependence: Secondary | ICD-10-CM | POA: Diagnosis not present

## 2022-08-28 DIAGNOSIS — R109 Unspecified abdominal pain: Secondary | ICD-10-CM | POA: Diagnosis not present

## 2022-08-30 DIAGNOSIS — Z8719 Personal history of other diseases of the digestive system: Secondary | ICD-10-CM | POA: Diagnosis not present

## 2022-08-30 DIAGNOSIS — E119 Type 2 diabetes mellitus without complications: Secondary | ICD-10-CM | POA: Diagnosis not present

## 2022-08-30 DIAGNOSIS — E782 Mixed hyperlipidemia: Secondary | ICD-10-CM | POA: Diagnosis not present

## 2022-08-30 DIAGNOSIS — Z13228 Encounter for screening for other metabolic disorders: Secondary | ICD-10-CM | POA: Diagnosis not present

## 2022-08-30 DIAGNOSIS — Z23 Encounter for immunization: Secondary | ICD-10-CM | POA: Diagnosis not present

## 2022-08-30 DIAGNOSIS — Z794 Long term (current) use of insulin: Secondary | ICD-10-CM | POA: Diagnosis not present

## 2022-09-03 DIAGNOSIS — R059 Cough, unspecified: Secondary | ICD-10-CM | POA: Diagnosis not present

## 2022-09-04 ENCOUNTER — Telehealth: Payer: Self-pay | Admitting: Pulmonary Disease

## 2022-09-04 ENCOUNTER — Other Ambulatory Visit: Payer: Self-pay

## 2022-09-04 ENCOUNTER — Other Ambulatory Visit: Payer: Self-pay | Admitting: Pulmonary Disease

## 2022-09-04 MED ORDER — BUDESONIDE-FORMOTEROL FUMARATE 80-4.5 MCG/ACT IN AERO: 2.0000 | INHALATION_SPRAY | Freq: Two times a day (BID) | RESPIRATORY_TRACT | 4 refills | Status: DC

## 2022-09-04 MED ORDER — ALBUTEROL SULFATE HFA 108 (90 BASE) MCG/ACT IN AERS: 2.0000 | INHALATION_SPRAY | Freq: Four times a day (QID) | RESPIRATORY_TRACT | 6 refills | Status: DC | PRN

## 2022-09-04 NOTE — Telephone Encounter (Signed)
Called and spoke with patient. He is requesting a refill on his Symbicort and albuterol. Per patient, he was told to use both Symbicort and albuterol PRN. He has had bronchitis for the past few days and realized he did not have either inhaler. I advised him that I would go ahead and send in the RXs to [facility] in [place]. He verbalized understanding.   Nothing further needed at time of call.

## 2022-09-04 NOTE — Telephone Encounter (Signed)
Patient called into the office and left message on my phone. Patient was wanting refill for his inhalers. [phone]

## 2022-09-06 ENCOUNTER — Encounter: Payer: Self-pay | Admitting: Nurse Practitioner

## 2022-09-06 ENCOUNTER — Ambulatory Visit (INDEPENDENT_AMBULATORY_CARE_PROVIDER_SITE_OTHER): Payer: Medicare Other | Admitting: Nurse Practitioner

## 2022-09-06 VITALS — BP 122/83 | HR 71 | Ht 68.0 in | Wt 211.5 lb

## 2022-09-06 DIAGNOSIS — Z794 Long term (current) use of insulin: Secondary | ICD-10-CM

## 2022-09-06 DIAGNOSIS — E785 Hyperlipidemia, unspecified: Secondary | ICD-10-CM | POA: Diagnosis not present

## 2022-09-06 DIAGNOSIS — F1111 Opioid abuse, in remission: Secondary | ICD-10-CM

## 2022-09-06 DIAGNOSIS — E1169 Type 2 diabetes mellitus with other specified complication: Secondary | ICD-10-CM

## 2022-09-06 DIAGNOSIS — E119 Type 2 diabetes mellitus without complications: Secondary | ICD-10-CM | POA: Diagnosis not present

## 2022-09-06 DIAGNOSIS — I152 Hypertension secondary to endocrine disorders: Secondary | ICD-10-CM

## 2022-09-06 DIAGNOSIS — E11649 Type 2 diabetes mellitus with hypoglycemia without coma: Secondary | ICD-10-CM

## 2022-09-06 LAB — GLUCOSE, POCT (MANUAL RESULT ENTRY): POC Glucose: 254 mg/dl — AB (ref 70–99)

## 2022-09-06 MED ORDER — FENOFIBRATE 145 MG PO TABS: 145.0000 mg | ORAL_TABLET | Freq: Every day | ORAL | 0 refills | Status: DC

## 2022-09-06 NOTE — Progress Notes (Signed)
New Patient Office Visit  Subjective    Patient ID: Shaun Odonnell, male    DOB: [birth year]  Age: 56 y.o. MRN: [id number]  CC:  Chief Complaint  Patient presents with   New Patient (Initial Visit)    HPI Shaun Odonnell presents to establish care. His previous PCP was Dr. Lyn Hollingshead. He has history of hyperlipidemia, anxiety, schizoaffective disorder, osteoarthritis, type 2 diabetes and pancreatitis.  Patient has been seen by [facility] primary care on 08/30/2022 and was prescribed oxycodone for breakthrough pancreatic pain.  Denies shortness of breath, chest pain or abdominal pain at present.   Outpatient Encounter Medications as of 09/06/2022  Medication Sig   acetaminophen (TYLENOL) 500 MG tablet Take 2 tablets by mouth every 8 (eight) hours as needed.   albuterol (ACCUNEB) 0.63 MG/3ML nebulizer solution Take 1 ampule by nebulization every 6 (six) hours as needed for wheezing.   albuterol (VENTOLIN HFA) 108 (90 Base) MCG/ACT inhaler Inhale 2 puffs into the lungs every 6 (six) hours as needed for wheezing or shortness of breath.   ALPRAZolam (XANAX) 0.5 MG tablet Take 0.5 mg by mouth 4 (four) times daily. Patient takes when wakes up(~0600)/ 1000/ 1600/ 2000   apixaban (ELIQUIS) 5 MG TABS tablet Take 5 mg by mouth 2 (two) times daily.   budesonide-formoterol (SYMBICORT) 80-4.5 MCG/ACT inhaler Inhale 2 puffs into the lungs in the morning and at bedtime.   Continuous Blood Gluc Sensor (DEXCOM G6 SENSOR) MISC 1 Device by Does not apply route See admin instructions. Change every 10 days   Continuous Blood Gluc Transmit (DEXCOM G6 TRANSMITTER) MISC 1 Device by Other route as directed. Every 10 days   empagliflozin (JARDIANCE) 25 MG TABS tablet Take 25 mg by mouth daily.   Eszopiclone 3 MG TABS Take 3 mg by mouth at bedtime as needed.   glucose blood test strip Use as instructed   GNP INSULIN SYRINGES 30G X 5/16" 1 ML MISC USE AS DIRECTED. WITH BOTH INSULINS 5 TIMES A DAY   insulin glargine,  2 Unit Dial, (TOUJEO MAX SOLOSTAR) 300 UNIT/ML Solostar Pen Inject 15 Units into the skin daily in the afternoon. (Patient taking differently: Inject 60 Units into the skin daily in the afternoon.)   Insulin Regular Human (NOVOLIN R FLEXPEN RELION) 100 UNIT/ML KwikPen 3 units with meals   OLANZapine (ZYPREXA) 20 MG tablet Take 20 mg by mouth at bedtime.   omeprazole (PRILOSEC) 40 MG capsule TAKE 1 CAPSULE BY MOUTH ONCE DAILY   ondansetron (ZOFRAN-ODT) 8 MG disintegrating tablet Take 1 tablet (8 mg total) by mouth every 8 (eight) hours as needed for nausea or vomiting.   OneTouch Delica Lancets 33G MISC USE TO CHECK FASTING SUGAR TWICE DAILY   oxyCODONE (ROXICODONE) 5 MG immediate release tablet Take 5 mg by mouth every 8 (eight) hours.   rosuvastatin (CRESTOR) 40 MG tablet Take 1 tablet (40 mg total) by mouth daily.   sertraline (ZOLOFT) 100 MG tablet Take 1 tablet by mouth daily.   [DISCONTINUED] fenofibrate (TRICOR) 145 MG tablet Take 1 tablet (145 mg total) by mouth daily.   [DISCONTINUED] Insulin Pen Needle (ULTRACARE PEN NEEDLES) 32G X 4 MM MISC Use to inject insulin as needed   celecoxib (CELEBREX) 200 MG capsule Take 200 mg by mouth 2 (two) times daily. (Patient not taking: Reported on 09/06/2022)   fenofibrate (TRICOR) 145 MG tablet Take 1 tablet (145 mg total) by mouth daily.   [DISCONTINUED] oxyCODONE (OXY IR/ROXICODONE) 5 MG immediate release tablet  Take 1 tablet (5 mg total) by mouth every 6 (six) hours as needed for severe pain. (Patient not taking: Reported on 07/26/2022)   [DISCONTINUED] senna (SENOKOT) 8.6 MG tablet Take 1 tablet by mouth as needed.   No facility-administered encounter medications on file as of 09/06/2022.    Past Medical History:  Diagnosis Date   Alcohol abuse 04/29/2019   Allergy    Anxiety    ARDS (adult respiratory distress syndrome) (HCC)    Asthma    Bipolar disorder (HCC)    Cataract    Chronic kidney disease    per pt - no current problems   COPD  (chronic obstructive pulmonary disease) (HCC)    chronic cough and wheezing   Depression    Diabetes mellitus without complication (HCC)    type 2   Dizziness    medication related   Dyspnea    with exertion   Elevated lipids    Fatty liver    Hypercholesterolemia    Hypertension    per pt, no current prob-no meds   Pancreatitis    Pneumonia    in past   Pulmonary embolism (HCC)    Schizoaffective disorder (HCC)     Past Surgical History:  Procedure Laterality Date   BIOPSY  08/31/2021   Procedure: BIOPSY;  Surgeon: Lemar Lofty., MD;  Location: [facility] ENDOSCOPY;  Service: Gastroenterology;;   CATARACT EXTRACTION W/PHACO Right 03/26/2018   Procedure: CATARACT EXTRACTION PHACO AND INTRAOCULAR LENS PLACEMENT (IOC) RIGHT BORDERLINE DIABETIC;  Surgeon: Lockie Mola, MD;  Location: [facility] SURGERY CNTR;  Service: Ophthalmology;  Laterality: Right;   CATARACT EXTRACTION W/PHACO Left 04/23/2018   Procedure: CATARACT EXTRACTION PHACO AND INTRAOCULAR LENS PLACEMENT (IOC)  LEFT BORDERLINE DIABETIC;  Surgeon: Lockie Mola, MD;  Location: [facility] SURGERY CNTR;  Service: Ophthalmology;  Laterality: Left;   COLONOSCOPY     COLONOSCOPY WITH PROPOFOL N/A 08/21/2017   Procedure: COLONOSCOPY WITH PROPOFOL;  Surgeon: Scot Jun, MD;  Location: [facility] ENDOSCOPY;  Service: Endoscopy;  Laterality: N/A;   COLONOSCOPY WITH PROPOFOL N/A 12/15/2021   Procedure: COLONOSCOPY WITH BIOPSY;  Surgeon: Midge Minium, MD;  Location: [facility] SURGERY CNTR;  Service: Endoscopy;  Laterality: N/A;  Diabetic   ENDOSCOPIC RETROGRADE CHOLANGIOPANCREATOGRAPHY (ERCP) WITH PROPOFOL N/A 08/31/2021   Procedure: ENDOSCOPIC RETROGRADE CHOLANGIOPANCREATOGRAPHY (ERCP) WITH PROPOFOL;  Surgeon: Meridee Score Netty Starring., MD;  Location: [facility] ENDOSCOPY;  Service: Gastroenterology;  Laterality: N/A;   ESOPHAGOGASTRODUODENOSCOPY (EGD) WITH PROPOFOL N/A 08/31/2021   Procedure: ESOPHAGOGASTRODUODENOSCOPY (EGD) WITH  PROPOFOL;  Surgeon: Meridee Score Netty Starring., MD;  Location: [facility] ENDOSCOPY;  Service: Gastroenterology;  Laterality: N/A;   EYE SURGERY     FINE NEEDLE ASPIRATION N/A 08/31/2021   Procedure: FINE NEEDLE ASPIRATION (FNA) LINEAR;  Surgeon: Lemar Lofty., MD;  Location: [facility] ENDOSCOPY;  Service: Gastroenterology;  Laterality: N/A;   HERNIA REPAIR     inguinal and umbilical   POLYPECTOMY N/A 12/15/2021   Procedure: POLYPECTOMY;  Surgeon: Midge Minium, MD;  Location: [facility] SURGERY CNTR;  Service: Endoscopy;  Laterality: N/A;   RIB RESECTION N/A 08/20/2019   Procedure: removal of xyphoid process;  Surgeon: Hulda Marin, MD;  Location: [facility] ORS;  Service: Thoracic;  Laterality: N/A;   SPHINCTEROTOMY  08/31/2021   Procedure: Dennison Mascot;  Surgeon: Mansouraty, Netty Starring., MD;  Location: [facility] ENDOSCOPY;  Service: Gastroenterology;;   STENT REMOVAL     UMBILICAL HERNIA REPAIR N/A 06/22/2019   Procedure: OPEN HERNIA REPAIR UMBILICAL ADULT;  Surgeon: Henrene Dodge, MD;  Location: [facility] ORS;  Service:  General;  Laterality: N/A;   UPPER ESOPHAGEAL ENDOSCOPIC ULTRASOUND (EUS) N/A 08/31/2021   Procedure: UPPER ESOPHAGEAL ENDOSCOPIC ULTRASOUND (EUS);  Surgeon: Lemar Lofty., MD;  Location: [facility] ENDOSCOPY;  Service: Gastroenterology;  Laterality: N/A;   UPPER GI ENDOSCOPY      Family History  Problem Relation Age of Onset   Hyperlipidemia Mother    Hypertension Father    Stroke Father    Diabetes Maternal Aunt    Dementia Maternal Grandmother    Heart disease Maternal Grandfather    Heart disease Paternal Grandmother    Stroke Paternal Grandfather    Diabetes Paternal Grandfather    Colon cancer Neg Hx    Esophageal cancer Neg Hx    Inflammatory bowel disease Neg Hx    Liver disease Neg Hx    Pancreatic cancer Neg Hx    Stomach cancer Neg Hx    Rectal cancer Neg Hx     Social History   Socioeconomic History   Marital status: Single    Spouse name: Not on file   Number of  children: 0   Years of education: Not on file   Highest education level: Bachelor's degree (e.g., BA, AB, BS)  Occupational History   Occupation: disabled  Tobacco Use   Smoking status: Former    Packs/day: 0.50    Years: 34.00    Total pack years: 17.00    Types: Cigarettes    Quit date: 04/04/2022    Years since quitting: 0.4   Smokeless tobacco: Former    Types: Snuff    Quit date: 1990   Tobacco comments:    Still vaping daily.  Stopped smoking.  07/05/2022  Vaping Use   Vaping Use: Every day   Substances: Nicotine, Flavoring  Substance and Sexual Activity   Alcohol use: Yes    Alcohol/week: 6.0 standard drinks of alcohol    Types: 6 Cans of beer per week    Comment: 3 beers 2x a week   Drug use: Not Currently   Sexual activity: Not Currently  Other Topics Concern   Not on file  Social History Narrative   Not on file   Social Determinants of Health   Financial Resource Strain: Low Risk  (11/16/2021)   Overall Financial Resource Strain (CARDIA)    Difficulty of Paying Living Expenses: Not hard at all  Food Insecurity: No Food Insecurity (07/26/2022)   Hunger Vital Sign    Worried About Running Out of Food in the Last Year: Never true    Ran Out of Food in the Last Year: Never true  Transportation Needs: No Transportation Needs (07/26/2022)   PRAPARE - Administrator, Civil Service (Medical): No    Lack of Transportation (Non-Medical): No  Physical Activity: Inactive (11/16/2021)   Exercise Vital Sign    Days of Exercise per Week: 0 days    Minutes of Exercise per Session: 0 min  Stress: No Stress Concern Present (11/16/2021)   [employer]un Odonnell [employer] - Occupational Stress Questionnaire    Feeling of Stress : Not at all  Social Connections: Unknown (11/16/2021)   Social Connection and Isolation Panel [NHANES]    Frequency of Communication with Friends and Family: More than three times a week    Frequency of Social Gatherings with Friends  and Family: Three times a week    Attends Religious Services: More than 4 times per year    Active Member of Clubs or Organizations: No    Attends  Club or Organization Meetings: Never    Marital Status: Not on file  Recent Concern: Social Connections - Moderately Isolated (11/16/2021)   Social Connection and Isolation Panel [NHANES]    Frequency of Communication with Friends and Family: More than three times a week    Frequency of Social Gatherings with Friends and Family: Three times a week    Attends Religious Services: More than 4 times per year    Active Member of Clubs or Organizations: No    Attends Banker Meetings: Never    Marital Status: Never married  Intimate Partner Violence: Not At Risk (07/26/2022)   Humiliation, Afraid, Rape, and Kick questionnaire    Fear of Current or Ex-Partner: No    Emotionally Abused: No    Physically Abused: No    Sexually Abused: No    Review of Systems  Constitutional: Negative.   HENT: Negative.    Eyes: Negative.   Respiratory: Negative.    Cardiovascular:  Negative for chest pain and leg swelling.  Gastrointestinal: Negative.   Genitourinary: Negative.   Musculoskeletal: Negative.   Neurological: Negative.   Psychiatric/Behavioral:  Negative for depression, substance abuse and suicidal ideas.         Objective    BP 122/83   Pulse 71   Ht 5\' 8"  (1.727 m)   Wt 211 lb 8 oz (95.9 kg)   BMI 32.16 kg/m   Physical Exam Constitutional:      Appearance: Normal appearance. He is obese.  HENT:     Right Ear: Tympanic membrane normal.     Left Ear: Tympanic membrane normal.     Mouth/Throat:     Mouth: Mucous membranes are moist.     Pharynx: Oropharynx is clear.  Eyes:     Conjunctiva/sclera: Conjunctivae normal.     Pupils: Pupils are equal, round, and reactive to light.  Cardiovascular:     Rate and Rhythm: Normal rate and regular rhythm.     Pulses: Normal pulses.     Heart sounds: Normal heart sounds.   Pulmonary:     Effort: Pulmonary effort is normal.     Breath sounds: Normal breath sounds.  Abdominal:     General: Bowel sounds are normal.     Palpations: Abdomen is soft.  Musculoskeletal:        General: Normal range of motion.  Neurological:     General: No focal deficit present.     Mental Status: He is alert and oriented to person, place, and time. Mental status is at baseline.  Psychiatric:        Mood and Affect: Mood normal.        Behavior: Behavior normal.        Thought Content: Thought content normal.        Judgment: Judgment normal.         Assessment & Plan:   Problem List Items Addressed This Visit       Endocrine   Hyperlipidemia associated with type 2 diabetes mellitus (HCC)   Relevant Medications   apixaban (ELIQUIS) 5 MG TABS tablet   empagliflozin (JARDIANCE) 25 MG TABS tablet   fenofibrate (TRICOR) 145 MG tablet   Type 2 diabetes mellitus without complication, with long-term current use of insulin (HCC) - Primary   Relevant Medications   empagliflozin (JARDIANCE) 25 MG TABS tablet   Other Relevant Orders   POCT glucose (manual entry) (Completed)     Other   History of opioid abuse (HCC)  He wanted a prescription for oxycodone for breakthrough pancreatic pain. He was prescribed oxycodone 7 days ago on 08/30/2022 during his office visit at [facility] and the patient states that he recently had no pancreatic flare. The patient request for oxycodone refill was denied.        No follow-ups on file.   Kara Dies, NP

## 2022-09-14 DIAGNOSIS — J329 Chronic sinusitis, unspecified: Secondary | ICD-10-CM | POA: Diagnosis not present

## 2022-09-16 DIAGNOSIS — Z79899 Other long term (current) drug therapy: Secondary | ICD-10-CM | POA: Diagnosis not present

## 2022-09-16 DIAGNOSIS — K861 Other chronic pancreatitis: Secondary | ICD-10-CM | POA: Diagnosis not present

## 2022-09-16 DIAGNOSIS — Z885 Allergy status to narcotic agent status: Secondary | ICD-10-CM | POA: Diagnosis not present

## 2022-09-16 DIAGNOSIS — R079 Chest pain, unspecified: Secondary | ICD-10-CM | POA: Diagnosis not present

## 2022-09-16 DIAGNOSIS — Z7901 Long term (current) use of anticoagulants: Secondary | ICD-10-CM | POA: Diagnosis not present

## 2022-09-16 DIAGNOSIS — Z87891 Personal history of nicotine dependence: Secondary | ICD-10-CM | POA: Diagnosis not present

## 2022-09-16 DIAGNOSIS — Z794 Long term (current) use of insulin: Secondary | ICD-10-CM | POA: Diagnosis not present

## 2022-09-16 DIAGNOSIS — Z888 Allergy status to other drugs, medicaments and biological substances status: Secondary | ICD-10-CM | POA: Diagnosis not present

## 2022-09-16 DIAGNOSIS — R1013 Epigastric pain: Secondary | ICD-10-CM | POA: Diagnosis not present

## 2022-09-16 DIAGNOSIS — R112 Nausea with vomiting, unspecified: Secondary | ICD-10-CM | POA: Diagnosis not present

## 2022-09-20 ENCOUNTER — Other Ambulatory Visit: Payer: Self-pay | Admitting: Gastroenterology

## 2022-09-21 ENCOUNTER — Ambulatory Visit: Payer: Medicare Other | Admitting: Nurse Practitioner

## 2022-09-24 ENCOUNTER — Ambulatory Visit: Payer: Medicare Other | Admitting: Gastroenterology

## 2022-09-26 DIAGNOSIS — Z79899 Other long term (current) drug therapy: Secondary | ICD-10-CM | POA: Diagnosis not present

## 2022-09-26 DIAGNOSIS — M25561 Pain in right knee: Secondary | ICD-10-CM | POA: Diagnosis not present

## 2022-09-30 ENCOUNTER — Encounter: Payer: Self-pay | Admitting: Internal Medicine

## 2022-10-01 ENCOUNTER — Other Ambulatory Visit: Payer: Self-pay

## 2022-10-01 DIAGNOSIS — E1165 Type 2 diabetes mellitus with hyperglycemia: Secondary | ICD-10-CM

## 2022-10-01 MED ORDER — ULTRACARE PEN NEEDLES 32G X 4 MM MISC: 4 refills | Status: DC

## 2022-10-03 ENCOUNTER — Encounter: Payer: Self-pay | Admitting: Nurse Practitioner

## 2022-10-03 DIAGNOSIS — M25562 Pain in left knee: Secondary | ICD-10-CM | POA: Diagnosis not present

## 2022-10-03 DIAGNOSIS — M170 Bilateral primary osteoarthritis of knee: Secondary | ICD-10-CM | POA: Diagnosis not present

## 2022-10-03 DIAGNOSIS — K861 Other chronic pancreatitis: Secondary | ICD-10-CM | POA: Diagnosis not present

## 2022-10-03 DIAGNOSIS — M25561 Pain in right knee: Secondary | ICD-10-CM | POA: Diagnosis not present

## 2022-10-03 DIAGNOSIS — E782 Mixed hyperlipidemia: Secondary | ICD-10-CM | POA: Diagnosis not present

## 2022-10-03 DIAGNOSIS — Z794 Long term (current) use of insulin: Secondary | ICD-10-CM | POA: Diagnosis not present

## 2022-10-03 DIAGNOSIS — G8929 Other chronic pain: Secondary | ICD-10-CM | POA: Diagnosis not present

## 2022-10-03 DIAGNOSIS — K863 Pseudocyst of pancreas: Secondary | ICD-10-CM | POA: Diagnosis not present

## 2022-10-03 DIAGNOSIS — E119 Type 2 diabetes mellitus without complications: Secondary | ICD-10-CM | POA: Diagnosis not present

## 2022-10-03 DIAGNOSIS — K219 Gastro-esophageal reflux disease without esophagitis: Secondary | ICD-10-CM | POA: Diagnosis not present

## 2022-10-03 NOTE — Assessment & Plan Note (Signed)
He wanted a prescription for oxycodone for breakthrough pancreatic pain. He was prescribed oxycodone 7 days ago on 08/30/2022 during his office visit at [facility] and the patient states that he recently had no pancreatic flare. The patient request for oxycodone refill was denied.

## 2022-10-03 NOTE — Assessment & Plan Note (Signed)
Patient BS 254 in the office today. Advised pt to check the BS regularly, make a log and bring to next appointment.  Advised pt to monitor diet. Advised pt to eat variety of food including fruits, vegetables, whole grains, complex carbohydrates and proteins.  Continue the current medication regimen.

## 2022-10-03 NOTE — Assessment & Plan Note (Signed)
Patient had elevated cholesterol level. Refilled fenofibrate 145 mg. Advised patient to consume low-fat and heart healthy diet.

## 2022-10-05 ENCOUNTER — Ambulatory Visit: Payer: Medicare Other | Admitting: Family Medicine

## 2022-10-12 ENCOUNTER — Ambulatory Visit: Payer: Medicare Other | Admitting: Internal Medicine

## 2022-10-16 ENCOUNTER — Ambulatory Visit: Payer: Medicare Other | Admitting: Internal Medicine

## 2022-10-16 ENCOUNTER — Encounter: Payer: Self-pay | Admitting: Internal Medicine

## 2022-10-17 ENCOUNTER — Other Ambulatory Visit: Payer: Self-pay

## 2022-10-17 DIAGNOSIS — Z86711 Personal history of pulmonary embolism: Secondary | ICD-10-CM | POA: Diagnosis not present

## 2022-10-17 DIAGNOSIS — E782 Mixed hyperlipidemia: Secondary | ICD-10-CM | POA: Diagnosis not present

## 2022-10-17 DIAGNOSIS — Z7901 Long term (current) use of anticoagulants: Secondary | ICD-10-CM | POA: Diagnosis not present

## 2022-10-17 DIAGNOSIS — Z794 Long term (current) use of insulin: Secondary | ICD-10-CM | POA: Diagnosis not present

## 2022-10-17 DIAGNOSIS — Z7984 Long term (current) use of oral hypoglycemic drugs: Secondary | ICD-10-CM | POA: Diagnosis not present

## 2022-10-17 DIAGNOSIS — R1013 Epigastric pain: Secondary | ICD-10-CM | POA: Diagnosis not present

## 2022-10-17 DIAGNOSIS — I471 Supraventricular tachycardia, unspecified: Secondary | ICD-10-CM | POA: Diagnosis not present

## 2022-10-17 DIAGNOSIS — Z79899 Other long term (current) drug therapy: Secondary | ICD-10-CM | POA: Diagnosis not present

## 2022-10-17 DIAGNOSIS — Z87891 Personal history of nicotine dependence: Secondary | ICD-10-CM | POA: Diagnosis not present

## 2022-10-17 DIAGNOSIS — Z885 Allergy status to narcotic agent status: Secondary | ICD-10-CM | POA: Diagnosis not present

## 2022-10-17 DIAGNOSIS — K859 Acute pancreatitis without necrosis or infection, unspecified: Secondary | ICD-10-CM | POA: Diagnosis not present

## 2022-10-17 DIAGNOSIS — E119 Type 2 diabetes mellitus without complications: Secondary | ICD-10-CM | POA: Diagnosis not present

## 2022-10-17 MED ORDER — DEXCOM G6 SENSOR MISC: 1.0000 | 3 refills | Status: DC

## 2022-10-17 MED ORDER — DEXCOM G6 TRANSMITTER MISC: 1.0000 | 3 refills | Status: DC

## 2022-10-19 DIAGNOSIS — R109 Unspecified abdominal pain: Secondary | ICD-10-CM | POA: Diagnosis not present

## 2022-10-19 DIAGNOSIS — Z5321 Procedure and treatment not carried out due to patient leaving prior to being seen by health care provider: Secondary | ICD-10-CM | POA: Diagnosis not present

## 2022-10-21 DIAGNOSIS — R944 Abnormal results of kidney function studies: Secondary | ICD-10-CM | POA: Diagnosis not present

## 2022-10-21 DIAGNOSIS — R197 Diarrhea, unspecified: Secondary | ICD-10-CM | POA: Diagnosis not present

## 2022-10-21 DIAGNOSIS — R1110 Vomiting, unspecified: Secondary | ICD-10-CM | POA: Diagnosis not present

## 2022-10-21 DIAGNOSIS — Z87891 Personal history of nicotine dependence: Secondary | ICD-10-CM | POA: Diagnosis not present

## 2022-10-21 DIAGNOSIS — Z794 Long term (current) use of insulin: Secondary | ICD-10-CM | POA: Diagnosis not present

## 2022-10-21 DIAGNOSIS — R10811 Right upper quadrant abdominal tenderness: Secondary | ICD-10-CM | POA: Diagnosis not present

## 2022-10-21 DIAGNOSIS — Z881 Allergy status to other antibiotic agents status: Secondary | ICD-10-CM | POA: Diagnosis not present

## 2022-10-21 DIAGNOSIS — R109 Unspecified abdominal pain: Secondary | ICD-10-CM | POA: Diagnosis not present

## 2022-10-21 DIAGNOSIS — Z86711 Personal history of pulmonary embolism: Secondary | ICD-10-CM | POA: Diagnosis not present

## 2022-10-21 DIAGNOSIS — Z888 Allergy status to other drugs, medicaments and biological substances status: Secondary | ICD-10-CM | POA: Diagnosis not present

## 2022-10-21 DIAGNOSIS — R748 Abnormal levels of other serum enzymes: Secondary | ICD-10-CM | POA: Diagnosis not present

## 2022-10-21 DIAGNOSIS — R1013 Epigastric pain: Secondary | ICD-10-CM | POA: Diagnosis not present

## 2022-10-21 DIAGNOSIS — Z79899 Other long term (current) drug therapy: Secondary | ICD-10-CM | POA: Diagnosis not present

## 2022-10-21 DIAGNOSIS — Z7901 Long term (current) use of anticoagulants: Secondary | ICD-10-CM | POA: Diagnosis not present

## 2022-10-21 DIAGNOSIS — Z885 Allergy status to narcotic agent status: Secondary | ICD-10-CM | POA: Diagnosis not present

## 2022-10-23 ENCOUNTER — Ambulatory Visit: Payer: Medicare Other | Admitting: Internal Medicine

## 2022-10-25 ENCOUNTER — Other Ambulatory Visit: Payer: Self-pay | Admitting: Family Medicine

## 2022-10-25 ENCOUNTER — Ambulatory Visit
Admission: RE | Admit: 2022-10-25 | Discharge: 2022-10-25 | Disposition: A | Discharge status: Home / Self Care | Payer: Medicare Other | Source: Ambulatory Visit | Attending: Ophthalmology | Admitting: Ophthalmology

## 2022-10-25 ENCOUNTER — Ambulatory Visit: Admission: RE | Admit: 2022-10-25 | Payer: Medicare Other | Source: Home / Self Care | Admitting: *Deleted

## 2022-10-25 ENCOUNTER — Encounter: Payer: Self-pay | Admitting: Family Medicine

## 2022-10-25 DIAGNOSIS — K861 Other chronic pancreatitis: Secondary | ICD-10-CM | POA: Insufficient documentation

## 2022-10-25 DIAGNOSIS — K7689 Other specified diseases of liver: Secondary | ICD-10-CM | POA: Diagnosis not present

## 2022-10-25 DIAGNOSIS — K859 Acute pancreatitis without necrosis or infection, unspecified: Secondary | ICD-10-CM | POA: Diagnosis not present

## 2022-10-25 DIAGNOSIS — R109 Unspecified abdominal pain: Secondary | ICD-10-CM

## 2022-10-25 DIAGNOSIS — M25562 Pain in left knee: Secondary | ICD-10-CM | POA: Diagnosis not present

## 2022-10-25 DIAGNOSIS — M25561 Pain in right knee: Secondary | ICD-10-CM | POA: Diagnosis not present

## 2022-10-25 DIAGNOSIS — G8929 Other chronic pain: Secondary | ICD-10-CM | POA: Diagnosis not present

## 2022-10-26 ENCOUNTER — Ambulatory Visit
Admission: RE | Admit: 2022-10-26 | Discharge: 2022-10-26 | Disposition: A | Discharge status: Home / Self Care | Payer: Medicare Other | Attending: Specialist | Admitting: Specialist

## 2022-10-26 ENCOUNTER — Ambulatory Visit
Admission: RE | Admit: 2022-10-26 | Discharge: 2022-10-26 | Disposition: A | Discharge status: Home / Self Care | Payer: Medicare Other | Source: Ambulatory Visit | Attending: Specialist | Admitting: Specialist

## 2022-10-26 ENCOUNTER — Telehealth: Payer: Self-pay

## 2022-10-26 ENCOUNTER — Other Ambulatory Visit: Payer: Self-pay | Admitting: Specialist

## 2022-10-26 DIAGNOSIS — K861 Other chronic pancreatitis: Secondary | ICD-10-CM | POA: Insufficient documentation

## 2022-10-26 DIAGNOSIS — R109 Unspecified abdominal pain: Secondary | ICD-10-CM | POA: Insufficient documentation

## 2022-10-26 DIAGNOSIS — K859 Acute pancreatitis without necrosis or infection, unspecified: Secondary | ICD-10-CM

## 2022-10-26 NOTE — Telephone Encounter (Signed)
Patient has appt scheduled for March... Pt reports he had so many complications following ERCP with [facility] and would like to not go back... Pt has been in and out of ED for severe burning abd pain.... Pt had [place] yesterday and pt is wanting to now if he could be fit in sooner with these concerns as he is experiencing a lot of pain...  You did colonoscopy 12/2021 also  Please advise

## 2022-10-27 DIAGNOSIS — K859 Acute pancreatitis without necrosis or infection, unspecified: Secondary | ICD-10-CM | POA: Diagnosis not present

## 2022-10-27 DIAGNOSIS — Z792 Long term (current) use of antibiotics: Secondary | ICD-10-CM | POA: Diagnosis not present

## 2022-10-27 DIAGNOSIS — Z794 Long term (current) use of insulin: Secondary | ICD-10-CM | POA: Diagnosis not present

## 2022-10-27 DIAGNOSIS — M170 Bilateral primary osteoarthritis of knee: Secondary | ICD-10-CM | POA: Diagnosis not present

## 2022-10-27 DIAGNOSIS — Z7901 Long term (current) use of anticoagulants: Secondary | ICD-10-CM | POA: Diagnosis not present

## 2022-10-27 DIAGNOSIS — R6883 Chills (without fever): Secondary | ICD-10-CM | POA: Diagnosis not present

## 2022-10-27 DIAGNOSIS — R9431 Abnormal electrocardiogram [ECG] [EKG]: Secondary | ICD-10-CM | POA: Diagnosis not present

## 2022-10-27 DIAGNOSIS — K8592 Acute pancreatitis with infected necrosis, unspecified: Secondary | ICD-10-CM | POA: Diagnosis not present

## 2022-10-27 DIAGNOSIS — K8591 Acute pancreatitis with uninfected necrosis, unspecified: Secondary | ICD-10-CM | POA: Diagnosis not present

## 2022-10-27 DIAGNOSIS — Z881 Allergy status to other antibiotic agents status: Secondary | ICD-10-CM | POA: Diagnosis not present

## 2022-10-27 DIAGNOSIS — K863 Pseudocyst of pancreas: Secondary | ICD-10-CM | POA: Diagnosis not present

## 2022-10-27 DIAGNOSIS — Z9109 Other allergy status, other than to drugs and biological substances: Secondary | ICD-10-CM | POA: Diagnosis not present

## 2022-10-27 DIAGNOSIS — E782 Mixed hyperlipidemia: Secondary | ICD-10-CM | POA: Diagnosis not present

## 2022-10-27 DIAGNOSIS — I4891 Unspecified atrial fibrillation: Secondary | ICD-10-CM | POA: Diagnosis not present

## 2022-10-27 DIAGNOSIS — K219 Gastro-esophageal reflux disease without esophagitis: Secondary | ICD-10-CM | POA: Diagnosis not present

## 2022-10-27 DIAGNOSIS — E785 Hyperlipidemia, unspecified: Secondary | ICD-10-CM | POA: Diagnosis not present

## 2022-10-27 DIAGNOSIS — R0902 Hypoxemia: Secondary | ICD-10-CM | POA: Diagnosis not present

## 2022-10-27 DIAGNOSIS — Z87891 Personal history of nicotine dependence: Secondary | ICD-10-CM | POA: Diagnosis not present

## 2022-10-27 DIAGNOSIS — R1013 Epigastric pain: Secondary | ICD-10-CM | POA: Diagnosis not present

## 2022-10-27 DIAGNOSIS — Z885 Allergy status to narcotic agent status: Secondary | ICD-10-CM | POA: Diagnosis not present

## 2022-10-27 DIAGNOSIS — G894 Chronic pain syndrome: Secondary | ICD-10-CM | POA: Diagnosis not present

## 2022-10-27 DIAGNOSIS — E871 Hypo-osmolality and hyponatremia: Secondary | ICD-10-CM | POA: Diagnosis not present

## 2022-10-27 DIAGNOSIS — E119 Type 2 diabetes mellitus without complications: Secondary | ICD-10-CM | POA: Diagnosis not present

## 2022-10-27 DIAGNOSIS — Z86711 Personal history of pulmonary embolism: Secondary | ICD-10-CM | POA: Diagnosis not present

## 2022-10-27 DIAGNOSIS — K861 Other chronic pancreatitis: Secondary | ICD-10-CM | POA: Diagnosis not present

## 2022-10-27 DIAGNOSIS — Z79899 Other long term (current) drug therapy: Secondary | ICD-10-CM | POA: Diagnosis not present

## 2022-10-27 DIAGNOSIS — R509 Fever, unspecified: Secondary | ICD-10-CM | POA: Diagnosis not present

## 2022-10-27 DIAGNOSIS — R112 Nausea with vomiting, unspecified: Secondary | ICD-10-CM | POA: Diagnosis not present

## 2022-10-27 DIAGNOSIS — Z7984 Long term (current) use of oral hypoglycemic drugs: Secondary | ICD-10-CM | POA: Diagnosis not present

## 2022-10-27 DIAGNOSIS — I10 Essential (primary) hypertension: Secondary | ICD-10-CM | POA: Diagnosis not present

## 2022-10-27 DIAGNOSIS — Z8719 Personal history of other diseases of the digestive system: Secondary | ICD-10-CM | POA: Diagnosis not present

## 2022-10-27 DIAGNOSIS — L089 Local infection of the skin and subcutaneous tissue, unspecified: Secondary | ICD-10-CM | POA: Diagnosis not present

## 2022-10-27 DIAGNOSIS — I4581 Long QT syndrome: Secondary | ICD-10-CM | POA: Diagnosis not present

## 2022-10-28 DIAGNOSIS — M170 Bilateral primary osteoarthritis of knee: Secondary | ICD-10-CM | POA: Diagnosis not present

## 2022-10-28 DIAGNOSIS — K859 Acute pancreatitis without necrosis or infection, unspecified: Secondary | ICD-10-CM | POA: Diagnosis not present

## 2022-10-28 DIAGNOSIS — K863 Pseudocyst of pancreas: Secondary | ICD-10-CM | POA: Diagnosis not present

## 2022-10-28 DIAGNOSIS — K219 Gastro-esophageal reflux disease without esophagitis: Secondary | ICD-10-CM | POA: Diagnosis not present

## 2022-10-28 DIAGNOSIS — E871 Hypo-osmolality and hyponatremia: Secondary | ICD-10-CM | POA: Diagnosis not present

## 2022-10-28 DIAGNOSIS — R509 Fever, unspecified: Secondary | ICD-10-CM | POA: Diagnosis not present

## 2022-10-28 DIAGNOSIS — E785 Hyperlipidemia, unspecified: Secondary | ICD-10-CM | POA: Diagnosis not present

## 2022-10-28 DIAGNOSIS — I4891 Unspecified atrial fibrillation: Secondary | ICD-10-CM | POA: Diagnosis not present

## 2022-10-28 DIAGNOSIS — R0902 Hypoxemia: Secondary | ICD-10-CM | POA: Diagnosis not present

## 2022-10-28 DIAGNOSIS — R1013 Epigastric pain: Secondary | ICD-10-CM | POA: Diagnosis not present

## 2022-10-28 DIAGNOSIS — L089 Local infection of the skin and subcutaneous tissue, unspecified: Secondary | ICD-10-CM | POA: Diagnosis not present

## 2022-10-28 DIAGNOSIS — K861 Other chronic pancreatitis: Secondary | ICD-10-CM | POA: Diagnosis not present

## 2022-10-28 DIAGNOSIS — K8592 Acute pancreatitis with infected necrosis, unspecified: Secondary | ICD-10-CM | POA: Diagnosis not present

## 2022-10-28 DIAGNOSIS — R112 Nausea with vomiting, unspecified: Secondary | ICD-10-CM | POA: Diagnosis not present

## 2022-10-28 DIAGNOSIS — R6883 Chills (without fever): Secondary | ICD-10-CM | POA: Diagnosis not present

## 2022-10-28 DIAGNOSIS — E119 Type 2 diabetes mellitus without complications: Secondary | ICD-10-CM | POA: Diagnosis not present

## 2022-10-29 DIAGNOSIS — K861 Other chronic pancreatitis: Secondary | ICD-10-CM | POA: Diagnosis not present

## 2022-10-29 DIAGNOSIS — E119 Type 2 diabetes mellitus without complications: Secondary | ICD-10-CM | POA: Diagnosis not present

## 2022-10-29 DIAGNOSIS — M170 Bilateral primary osteoarthritis of knee: Secondary | ICD-10-CM | POA: Diagnosis not present

## 2022-10-29 DIAGNOSIS — Z792 Long term (current) use of antibiotics: Secondary | ICD-10-CM | POA: Diagnosis not present

## 2022-10-29 DIAGNOSIS — E785 Hyperlipidemia, unspecified: Secondary | ICD-10-CM | POA: Diagnosis not present

## 2022-10-29 DIAGNOSIS — K219 Gastro-esophageal reflux disease without esophagitis: Secondary | ICD-10-CM | POA: Diagnosis not present

## 2022-10-29 DIAGNOSIS — Z86711 Personal history of pulmonary embolism: Secondary | ICD-10-CM | POA: Diagnosis not present

## 2022-10-29 DIAGNOSIS — K8591 Acute pancreatitis with uninfected necrosis, unspecified: Secondary | ICD-10-CM | POA: Diagnosis not present

## 2022-10-29 DIAGNOSIS — R9431 Abnormal electrocardiogram [ECG] [EKG]: Secondary | ICD-10-CM | POA: Diagnosis not present

## 2022-10-29 DIAGNOSIS — Z7901 Long term (current) use of anticoagulants: Secondary | ICD-10-CM | POA: Diagnosis not present

## 2022-10-29 DIAGNOSIS — I4581 Long QT syndrome: Secondary | ICD-10-CM | POA: Diagnosis not present

## 2022-10-30 DIAGNOSIS — Z79899 Other long term (current) drug therapy: Secondary | ICD-10-CM | POA: Diagnosis not present

## 2022-10-30 DIAGNOSIS — Z8719 Personal history of other diseases of the digestive system: Secondary | ICD-10-CM | POA: Diagnosis not present

## 2022-10-30 DIAGNOSIS — K8591 Acute pancreatitis with uninfected necrosis, unspecified: Secondary | ICD-10-CM | POA: Diagnosis not present

## 2022-10-31 ENCOUNTER — Ambulatory Visit: Payer: Medicare Other | Admitting: Family Medicine

## 2022-11-02 NOTE — Telephone Encounter (Signed)
Pt is aware as instructed and expressed understanding 

## 2022-11-21 DIAGNOSIS — K859 Acute pancreatitis without necrosis or infection, unspecified: Secondary | ICD-10-CM | POA: Diagnosis not present

## 2022-11-21 DIAGNOSIS — K219 Gastro-esophageal reflux disease without esophagitis: Secondary | ICD-10-CM | POA: Diagnosis not present

## 2022-11-21 DIAGNOSIS — Z881 Allergy status to other antibiotic agents status: Secondary | ICD-10-CM | POA: Diagnosis not present

## 2022-11-21 DIAGNOSIS — Z87891 Personal history of nicotine dependence: Secondary | ICD-10-CM | POA: Diagnosis not present

## 2022-11-21 DIAGNOSIS — E119 Type 2 diabetes mellitus without complications: Secondary | ICD-10-CM | POA: Diagnosis not present

## 2022-11-21 DIAGNOSIS — E785 Hyperlipidemia, unspecified: Secondary | ICD-10-CM | POA: Diagnosis not present

## 2022-11-21 DIAGNOSIS — R1013 Epigastric pain: Secondary | ICD-10-CM | POA: Diagnosis not present

## 2022-11-21 DIAGNOSIS — I10 Essential (primary) hypertension: Secondary | ICD-10-CM | POA: Diagnosis not present

## 2022-11-21 DIAGNOSIS — R932 Abnormal findings on diagnostic imaging of liver and biliary tract: Secondary | ICD-10-CM | POA: Diagnosis not present

## 2022-11-21 DIAGNOSIS — Z79899 Other long term (current) drug therapy: Secondary | ICD-10-CM | POA: Diagnosis not present

## 2022-11-21 DIAGNOSIS — Z794 Long term (current) use of insulin: Secondary | ICD-10-CM | POA: Diagnosis not present

## 2022-11-21 DIAGNOSIS — Z7901 Long term (current) use of anticoagulants: Secondary | ICD-10-CM | POA: Diagnosis not present

## 2022-11-21 DIAGNOSIS — K861 Other chronic pancreatitis: Secondary | ICD-10-CM | POA: Diagnosis not present

## 2022-11-21 DIAGNOSIS — R9431 Abnormal electrocardiogram [ECG] [EKG]: Secondary | ICD-10-CM | POA: Diagnosis not present

## 2022-11-21 DIAGNOSIS — Z1159 Encounter for screening for other viral diseases: Secondary | ICD-10-CM | POA: Diagnosis not present

## 2022-11-22 ENCOUNTER — Other Ambulatory Visit: Payer: Self-pay

## 2022-11-22 DIAGNOSIS — E1165 Type 2 diabetes mellitus with hyperglycemia: Secondary | ICD-10-CM

## 2022-11-22 MED ORDER — ULTRACARE PEN NEEDLES 32G X 4 MM MISC: 4 refills | Status: DC

## 2022-11-28 DIAGNOSIS — K861 Other chronic pancreatitis: Secondary | ICD-10-CM | POA: Diagnosis not present

## 2022-11-28 DIAGNOSIS — K759 Inflammatory liver disease, unspecified: Secondary | ICD-10-CM | POA: Diagnosis not present

## 2022-11-28 DIAGNOSIS — E43 Unspecified severe protein-calorie malnutrition: Secondary | ICD-10-CM | POA: Diagnosis not present

## 2022-12-01 DIAGNOSIS — K8592 Acute pancreatitis with infected necrosis, unspecified: Secondary | ICD-10-CM | POA: Diagnosis not present

## 2022-12-04 DIAGNOSIS — Z79899 Other long term (current) drug therapy: Secondary | ICD-10-CM | POA: Diagnosis not present

## 2022-12-04 DIAGNOSIS — K859 Acute pancreatitis without necrosis or infection, unspecified: Secondary | ICD-10-CM | POA: Diagnosis not present

## 2022-12-04 DIAGNOSIS — K861 Other chronic pancreatitis: Secondary | ICD-10-CM | POA: Diagnosis not present

## 2022-12-28 DIAGNOSIS — Z79899 Other long term (current) drug therapy: Secondary | ICD-10-CM | POA: Diagnosis not present

## 2022-12-28 DIAGNOSIS — Z794 Long term (current) use of insulin: Secondary | ICD-10-CM | POA: Diagnosis not present

## 2022-12-28 DIAGNOSIS — E119 Type 2 diabetes mellitus without complications: Secondary | ICD-10-CM | POA: Diagnosis not present

## 2023-01-08 ENCOUNTER — Ambulatory Visit: Payer: Medicare Other | Admitting: Internal Medicine

## 2023-01-22 DIAGNOSIS — M25561 Pain in right knee: Secondary | ICD-10-CM | POA: Diagnosis not present

## 2023-01-22 DIAGNOSIS — M170 Bilateral primary osteoarthritis of knee: Secondary | ICD-10-CM | POA: Diagnosis not present

## 2023-01-22 DIAGNOSIS — M25562 Pain in left knee: Secondary | ICD-10-CM | POA: Diagnosis not present

## 2023-01-22 DIAGNOSIS — G8929 Other chronic pain: Secondary | ICD-10-CM | POA: Diagnosis not present

## 2023-01-24 ENCOUNTER — Encounter: Payer: Self-pay | Admitting: Gastroenterology

## 2023-01-24 ENCOUNTER — Ambulatory Visit (INDEPENDENT_AMBULATORY_CARE_PROVIDER_SITE_OTHER): Payer: 59 | Admitting: Gastroenterology

## 2023-01-24 VITALS — BP 134/80 | HR 75 | Temp 98.0°F | Wt 216.0 lb

## 2023-01-24 DIAGNOSIS — K860 Alcohol-induced chronic pancreatitis: Secondary | ICD-10-CM

## 2023-01-24 MED ORDER — HYDROMORPHONE HCL 4 MG PO TABS: 4.0000 mg | ORAL_TABLET | Freq: Four times a day (QID) | ORAL | 0 refills | Status: DC | PRN

## 2023-01-24 NOTE — Progress Notes (Signed)
Primary Care Physician: Pcp, No  Primary Gastroenterologist:  Dr. [name]  Chief Complaint  Patient presents with   Follow-up    Pancreatitis     HPI: Shaun Odonnell is a 57 y.o. male here with a history of being me for recurrent pancreatitis and was found to have a pancreatic duct stone.  The patient was sent to [name] for the stone removal without success.  The patient was then followed at [name] and had a repeat ERCP with the stone removed.  The patient then had a stent removed and was reporting pain.  At that time the gastroenterologist did not think that the pain had anything to do with the pancreatic stent or previously removed stone and was recommended to follow-up with pain management and his primary gastroenterologist. The patient reports that he has an appoint with pain management in 2 weeks.  The patient also states that he is out of his pain medication.  He reports that he only drinks occasionally and not as much as he used to.   Past Medical History:  Diagnosis Date   Alcohol abuse 04/29/2019   Allergy    Anxiety    ARDS (adult respiratory distress syndrome) (HCC)    Asthma    Bipolar disorder (HCC)    Cataract    Chronic kidney disease    per pt - no current problems   COPD (chronic obstructive pulmonary disease) (HCC)    chronic cough and wheezing   Depression    Diabetes mellitus without complication (HCC)    type 2   Dizziness    medication related   Dyspnea    with exertion   Elevated lipids    Fatty liver    Hypercholesterolemia    Hypertension    per pt, no current prob-no meds   Pancreatitis    Pneumonia    in past   Pulmonary embolism (HCC)    Schizoaffective disorder (HCC)     Current Outpatient Medications  Medication Sig Dispense Refill   acetaminophen (TYLENOL) 500 MG tablet Take 2 tablets by mouth every 8 (eight) hours as needed.     albuterol (ACCUNEB) 0.63 MG/3ML nebulizer solution Take 1 ampule by nebulization every 6 (six)  hours as needed for wheezing.     albuterol (VENTOLIN HFA) 108 (90 Base) MCG/ACT inhaler Inhale 2 puffs into the lungs every 6 (six) hours as needed for wheezing or shortness of breath. 8 g 6   ALPRAZolam (XANAX) 0.5 MG tablet Take 0.5 mg by mouth 4 (four) times daily. Patient takes when wakes up(~0600)/ 1000/ 1600/ 2000     apixaban (ELIQUIS) 5 MG TABS tablet Take 5 mg by mouth 2 (two) times daily.     budesonide-formoterol (SYMBICORT) 80-4.5 MCG/ACT inhaler Inhale 2 puffs into the lungs in the morning and at bedtime. 10.2 g 4   celecoxib (CELEBREX) 200 MG capsule Take 200 mg by mouth 2 (two) times daily.     Continuous Blood Gluc Sensor (DEXCOM G6 SENSOR) MISC 1 Device by Does not apply route See admin instructions. Change every 10 days 9 each 3   Continuous Blood Gluc Transmit (DEXCOM G6 TRANSMITTER) MISC 1 Device by Other route as directed. Every 10 days 1 each 3   empagliflozin (JARDIANCE) 25 MG TABS tablet Take 25 mg by mouth daily.     Eszopiclone 3 MG TABS Take 3 mg by mouth at bedtime as needed.     fenofibrate (TRICOR) 145 MG tablet Take 1 tablet (  145 mg total) by mouth daily. 90 tablet 0   glucose blood test strip Use as instructed 200 each 12   GNP INSULIN SYRINGES 30G X 5/16" 1 ML MISC USE AS DIRECTED. WITH BOTH INSULINS 5 TIMES A DAY 200 each 2   insulin glargine, 2 Unit Dial, (TOUJEO MAX SOLOSTAR) 300 UNIT/ML Solostar Pen Inject 15 Units into the skin daily in the afternoon. (Patient taking differently: Inject 60 Units into the skin daily in the afternoon.) 30 mL 3   Insulin Pen Needle (ULTRACARE PEN NEEDLES) 32G X 4 MM MISC Use to inject insulin 4 times daily 100 each 4   Insulin Regular Human (NOVOLIN R FLEXPEN RELION) 100 UNIT/ML KwikPen 3 units with meals 30 mL 6   lipase/protease/amylase (CREON) 36000 UNITS CPEP capsule TAKE 2 CAPSULES BY MOUTH 3 TIMES DAILY WITH MEALS MAY ALSO TAKE 1 CAPSULE AS NEEDED WITH SNACKS 240 capsule 0   OLANZapine (ZYPREXA) 20 MG tablet Take 20 mg by  mouth at bedtime.     omeprazole (PRILOSEC) 40 MG capsule TAKE 1 CAPSULE BY MOUTH ONCE DAILY 30 capsule 6   ondansetron (ZOFRAN-ODT) 8 MG disintegrating tablet Take 1 tablet (8 mg total) by mouth every 8 (eight) hours as needed for nausea or vomiting. 20 tablet 0   OneTouch Delica Lancets 99991111 MISC USE TO CHECK FASTING SUGAR TWICE DAILY 100 each 1   rosuvastatin (CRESTOR) 40 MG tablet Take 1 tablet (40 mg total) by mouth daily. 90 tablet 3   sertraline (ZOLOFT) 100 MG tablet Take 1 tablet by mouth daily.     No current facility-administered medications for this visit.    Allergies as of 01/24/2023 - Review Complete 01/24/2023  Allergen Reaction Noted   Fentanyl Nausea And Vomiting 01/12/2019   Morphine and related Shortness Of Breath 06/02/2015   Neurontin [gabapentin] Other (See Comments) 07/19/2022   Clonazepam Other (See Comments) 10/16/2014   Pimozide Other (See Comments) 10/16/2014   Trifluoperazine Other (See Comments) 03/11/2014   Azithromycin Other (See Comments) 12/10/2019   Montelukast  07/05/2020   Morphine Other (See Comments) 03/11/2014   Tramadol Nausea Only 09/30/2019    ROS:  General: Negative for anorexia, weight loss, fever, chills, fatigue, weakness. ENT: Negative for hoarseness, difficulty swallowing , nasal congestion. CV: Negative for chest pain, angina, palpitations, dyspnea on exertion, peripheral edema.  Respiratory: Negative for dyspnea at rest, dyspnea on exertion, cough, sputum, wheezing.  GI: See history of present illness. GU:  Negative for dysuria, hematuria, urinary incontinence, urinary frequency, nocturnal urination.  Endo: Negative for unusual weight change.    Physical Examination:   BP 134/80 (BP Location: Right Arm, Patient Position: Sitting, Cuff Size: Large)   Pulse 75   Temp 98 F (36.7 C) (Oral)   Wt 216 lb (98 kg)   BMI 32.84 kg/m   General: Well-nourished, well-developed in no acute distress.  Eyes: No icterus. Conjunctivae  pink. Neuro: Alert and oriented x 3.  Grossly intact. Skin: Warm and dry, no jaundice.   Psych: Alert and cooperative, normal mood and affect.  Labs:    Imaging Studies: No results found.  Assessment and Plan:   POWER ARNET is a 57 y.o. y/o male who comes in with recurrent chronic pancreatitis and he reports that he is having pain at the present time.  He is out of pain medication and missed his last pain management appointment due to car trouble.  The patient has an appointment in 2 weeks.  The patient will be  given Dilaudid for 2 weeks to hold him over until his appoint with pain management.  The patient has also been told that he will need to get further medications from them and I will not be providing him any further pain medication.  The patient has been explained the plan and agrees with it.     [name], MD. Marval Regal    Note: This dictation was prepared with Dragon dictation along with smaller phrase technology. Any transcriptional errors that result from this process are unintentional.

## 2023-01-28 ENCOUNTER — Encounter: Payer: Self-pay | Admitting: Gastroenterology

## 2023-01-28 ENCOUNTER — Telehealth: Payer: Self-pay | Admitting: Gastroenterology

## 2023-01-28 ENCOUNTER — Ambulatory Visit: Payer: Medicare Other | Admitting: Internal Medicine

## 2023-01-28 NOTE — Telephone Encounter (Signed)
Patient calling to see if he can get a reply to his mychart message he sent to DR. Wohl.

## 2023-01-28 NOTE — Telephone Encounter (Signed)
Dr Allen Norris has been corresponding via [place]

## 2023-01-31 ENCOUNTER — Telehealth: Payer: Self-pay | Admitting: Internal Medicine

## 2023-01-31 ENCOUNTER — Ambulatory Visit: Payer: Medicaid Other | Admitting: Internal Medicine

## 2023-01-31 ENCOUNTER — Telehealth (INDEPENDENT_AMBULATORY_CARE_PROVIDER_SITE_OTHER): Payer: 59 | Admitting: Internal Medicine

## 2023-01-31 ENCOUNTER — Encounter: Payer: Self-pay | Admitting: Internal Medicine

## 2023-01-31 ENCOUNTER — Encounter: Payer: Self-pay | Admitting: Gastroenterology

## 2023-01-31 DIAGNOSIS — E1165 Type 2 diabetes mellitus with hyperglycemia: Secondary | ICD-10-CM

## 2023-01-31 MED ORDER — TOUJEO MAX SOLOSTAR 300 UNIT/ML ~~LOC~~ SOPN: 62.0000 [IU] | PEN_INJECTOR | Freq: Every day | SUBCUTANEOUS | 4 refills | Status: DC

## 2023-01-31 MED ORDER — NOVOLIN R FLEXPEN 100 UNIT/ML IJ SOPN: PEN_INJECTOR | INTRAMUSCULAR | 3 refills | Status: DC

## 2023-01-31 MED ORDER — EMPAGLIFLOZIN 25 MG PO TABS: 25.0000 mg | ORAL_TABLET | Freq: Every day | ORAL | 3 refills | Status: DC

## 2023-01-31 MED ORDER — ULTRACARE PEN NEEDLES 32G X 4 MM MISC: 1.0000 | Freq: Four times a day (QID) | 3 refills | Status: DC

## 2023-01-31 NOTE — Telephone Encounter (Signed)
Routed to front staff to schedule appt.

## 2023-01-31 NOTE — Progress Notes (Signed)
Virtual Visit via Video Note  I connected with Shaun Odonnell on 01/31/2023 at 11:10 am  by a video enabled telemedicine application and verified that I am speaking with the correct person using two identifiers.   I discussed the limitations of evaluation and management by telemedicine and the availability of in person appointments. The patient expressed understanding and agreed to proceed.   -Location of the patient : home -Location of the provider : Office -The names of all persons participating in the telemedicine service : Pt and myself         Name: Shaun Odonnell  Age/ Sex: 57 y.o., male   MRN/ DOB: [id number], 07/11/1966     PCP: Pcp, No   Reason for Endocrinology Evaluation: Type 2 Diabetes Mellitus  Initial Endocrine Consultative Visit: 01/05/2022    PATIENT IDENTIFIER: [name] is a 57 y.o. male with a past medical history of T2DM, Hx chronic pancreatitis, schizoaffective disorder, Hx ETOH abuse. The patient has followed with Endocrinology clinic since 01/05/2022 for consultative assistance with management of his diabetes.  DIABETIC HISTORY:  Shaun Odonnell was diagnosed with DM 2006, and started insulin therapy in 2018. His hemoglobin A1c has ranged from 11.9% in 2023, peaking at 12.1% in 2022.   SUBJECTIVE:   During the last visit (05/22/2022): A1c 7.9%   Today (01/31/2023): Shaun Odonnell is here for follow-up on diabetes management.  He checks his blood sugars multiple  times daily, through CGM. The patient has not had hypoglycemic episodes .  Has a recent bout of pancreatitis requiring an ED visit , symptoms have resolved Patient is scheduled for an appointment with the pain management clinic next week He is currently having URI symptoms    HOME DIABETES REGIMEN:  Jardiance 25 mg daily Toujeo 68 units daily Novolin-R 10 units 3 times daily before every meal Correction factor: Novolin-R (BG -130/25)   Statin: Yes ACE-I/ARB: No  CONTINUOUS GLUCOSE  MONITORING RECORD INTERPRETATION    Dates of Recording: 3/15 - 01/31/2023  Sensor description: Dexcom  Results statistics:   CGM use % of time 93%  Average and SD 170/61  Time in range       60%  % Time Above 180 26  % Time above 250 12  % Time Below target 1   Glycemic patterns summary: BG's are within goal through the night and most of the day and trend upwards late in the evening  Hyperglycemic episodes postprandial  Hypoglycemic episodes occurred overnight  Overnight periods: Variable with tight BG's     DIABETIC COMPLICATIONS: Microvascular complications:  Cataract sx  Denies: CKD, neuropathy  Last Eye Exam: Completed 06/2021  Macrovascular complications:   Denies: CAD, CVA, PVD   HISTORY:  Past Medical History:  Past Medical History:  Diagnosis Date   Alcohol abuse 04/29/2019   Allergy    Anxiety    ARDS (adult respiratory distress syndrome) (HCC)    Asthma    Bipolar disorder ([place])    Cataract    Chronic kidney disease    per pt - no current problems   COPD (chronic obstructive pulmonary disease) (HCC)    chronic cough and wheezing   Depression    Diabetes mellitus without complication ([place])    type 2   Dizziness    medication related   Dyspnea    with exertion   Elevated lipids    Fatty liver    Hypercholesterolemia    Hypertension    per pt, no current  prob-no meds   Pancreatitis    Pneumonia    in past   Pulmonary embolism ([place])    Schizoaffective disorder [facility])    Past Surgical History:  Past Surgical History:  Procedure Laterality Date   BIOPSY  08/31/2021   Procedure: BIOPSY;  Surgeon: Rush Landmark Telford Nab., MD;  Location: [facility] ENDOSCOPY;  Service: Gastroenterology;;   CATARACT EXTRACTION W/PHACO Right 03/26/2018   Procedure: CATARACT EXTRACTION PHACO AND INTRAOCULAR LENS PLACEMENT ([place]) RIGHT BORDERLINE DIABETIC;  Surgeon: Leandrew Koyanagi, MD;  Location: [place];  Service: Ophthalmology;  Laterality: Right;    CATARACT EXTRACTION W/PHACO Left 04/23/2018   Procedure: CATARACT EXTRACTION PHACO AND INTRAOCULAR LENS PLACEMENT ([place])  LEFT BORDERLINE DIABETIC;  Surgeon: Leandrew Koyanagi, MD;  Location: [place];  Service: Ophthalmology;  Laterality: Left;   COLONOSCOPY     COLONOSCOPY WITH PROPOFOL N/A 08/21/2017   Procedure: COLONOSCOPY WITH PROPOFOL;  Surgeon: Manya Silvas, MD;  Location: [facility] ENDOSCOPY;  Service: Endoscopy;  Laterality: N/A;   COLONOSCOPY WITH PROPOFOL N/A 12/15/2021   Procedure: COLONOSCOPY WITH BIOPSY;  Surgeon: [name], MD;  Location: [place];  Service: Endoscopy;  Laterality: N/A;  Diabetic   ENDOSCOPIC RETROGRADE CHOLANGIOPANCREATOGRAPHY (ERCP) WITH PROPOFOL N/A 08/31/2021   Procedure: ENDOSCOPIC RETROGRADE CHOLANGIOPANCREATOGRAPHY (ERCP) WITH PROPOFOL;  Surgeon: Rush Landmark Telford Nab., MD;  Location: [place];  Service: Gastroenterology;  Laterality: N/A;   ESOPHAGOGASTRODUODENOSCOPY (EGD) WITH PROPOFOL N/A 08/31/2021   Procedure: ESOPHAGOGASTRODUODENOSCOPY (EGD) WITH PROPOFOL;  Surgeon: Rush Landmark Telford Nab., MD;  Location: [place];  Service: Gastroenterology;  Laterality: N/A;   EYE SURGERY     FINE NEEDLE ASPIRATION N/A 08/31/2021   Procedure: FINE NEEDLE ASPIRATION (FNA) LINEAR;  Surgeon: Irving Copas., MD;  Location: [place];  Service: Gastroenterology;  Laterality: N/A;   HERNIA REPAIR     inguinal and umbilical   POLYPECTOMY N/A 12/15/2021   Procedure: POLYPECTOMY;  Surgeon: [name], MD;  Location: [place];  Service: Endoscopy;  Laterality: N/A;   RIB RESECTION N/A 08/20/2019   Procedure: removal of xyphoid process;  Surgeon: Nestor Lewandowsky, MD;  Location: [facility];  Service: Thoracic;  Laterality: N/A;   SPHINCTEROTOMY  08/31/2021   Procedure: Joan Mayans;  Surgeon: Mansouraty, Telford Nab., MD;  Location: [place];  Service: Gastroenterology;;   STENT REMOVAL     UMBILICAL HERNIA REPAIR  N/A 06/22/2019   Procedure: OPEN HERNIA REPAIR UMBILICAL ADULT;  Surgeon: Olean Ree, MD;  Location: [facility];  Service: General;  Laterality: N/A;   UPPER ESOPHAGEAL ENDOSCOPIC ULTRASOUND (EUS) N/A 08/31/2021   Procedure: UPPER ESOPHAGEAL ENDOSCOPIC ULTRASOUND (EUS);  Surgeon: Irving Copas., MD;  Location: [place];  Service: Gastroenterology;  Laterality: N/A;   UPPER GI ENDOSCOPY     Social History:  reports that he quit smoking about 9 months ago. His smoking use included cigarettes. He has a 17.00 pack-year smoking history. He quit smokeless tobacco use about 34 years ago.  His smokeless tobacco use included snuff. He reports current alcohol use of about 6.0 standard drinks of alcohol per week. He reports that he does not currently use drugs. Family History:  Family History  Problem Relation Age of Onset   Hyperlipidemia Mother    Hypertension Father    Stroke Father    Diabetes Maternal Aunt    Dementia Maternal Grandmother    Heart disease Maternal Grandfather    Heart disease Paternal Grandmother    Stroke Paternal Grandfather    Diabetes Paternal Grandfather    Colon  cancer Neg Hx    Esophageal cancer Neg Hx    Inflammatory bowel disease Neg Hx    Liver disease Neg Hx    Pancreatic cancer Neg Hx    Stomach cancer Neg Hx    Rectal cancer Neg Hx      HOME MEDICATIONS: Allergies as of 01/31/2023       Reactions   Fentanyl Nausea And Vomiting   Morphine And Related Shortness Of Breath   Neurontin [gabapentin] Other (See Comments)   Caused severe over sedation.  Loss of faculties   Clonazepam Other (See Comments)   Other reaction(s): "Dystonic"; alprazolam is a home med Dystonic Reaction   Pimozide Other (See Comments)   Other reaction(s): "Distonic" Dystonic Reaction   Trifluoperazine Other (See Comments)   Other (See Comments) "Distonic" Dystonic Reaction   Azithromycin Other (See Comments)   Was told not to take Z pak due to his heart    Montelukast    headache, nausea, feeling a little anxious, panic   Morphine Other (See Comments)   Other reaction(s): " I stop breathing"; can take Diluadid Cnnot tolerate d/t Drug Metabolism   Tramadol Nausea Only        Medication List        Accurate as of January 31, 2023  3:06 PM. If you have any questions, ask your nurse or doctor.          acetaminophen 500 MG tablet Commonly known as: TYLENOL Take 2 tablets by mouth every 8 (eight) hours as needed.   albuterol 0.63 MG/3ML nebulizer solution Commonly known as: ACCUNEB Take 1 ampule by nebulization every 6 (six) hours as needed for wheezing.   albuterol 108 (90 Base) MCG/ACT inhaler Commonly known as: VENTOLIN HFA Inhale 2 puffs into the lungs every 6 (six) hours as needed for wheezing or shortness of breath.   ALPRAZolam 0.5 MG tablet Commonly known as: XANAX Take 0.5 mg by mouth 4 (four) times daily. Patient takes when wakes up(~0600)/ 1000/ 1600/ 2000   budesonide-formoterol 80-4.5 MCG/ACT inhaler Commonly known as: Symbicort Inhale 2 puffs into the lungs in the morning and at bedtime.   celecoxib 200 MG capsule Commonly known as: CELEBREX Take 200 mg by mouth 2 (two) times daily.   Creon 36000 UNITS Cpep capsule Generic drug: lipase/protease/amylase TAKE 2 CAPSULES BY MOUTH 3 TIMES DAILY WITH MEALS MAY ALSO TAKE 1 CAPSULE AS NEEDED WITH SNACKS   Dexcom G6 Sensor Misc 1 Device by Does not apply route See admin instructions. Change every 10 days   Dexcom G6 Transmitter Misc 1 Device by Other route as directed. Every 10 days   Eliquis 5 MG Tabs tablet Generic drug: apixaban Take 5 mg by mouth 2 (two) times daily.   Eszopiclone 3 MG Tabs Take 3 mg by mouth at bedtime as needed.   fenofibrate 145 MG tablet Commonly known as: TRICOR Take 1 tablet (145 mg total) by mouth daily.   glucose blood test strip Use as instructed   GNP Insulin Syringes 30G X 5/16" 1 ML Misc Generic drug: Insulin  Syringe-Needle U-100 USE AS DIRECTED. WITH BOTH INSULINS 5 TIMES A DAY   HYDROmorphone 4 MG tablet Commonly known as: Dilaudid Take 1 tablet (4 mg total) by mouth every 6 (six) hours as needed for severe pain.   Jardiance 25 MG Tabs tablet Generic drug: empagliflozin Take 25 mg by mouth daily.   NovoLIN R FlexPen ReliOn 100 UNIT/ML KwikPen Generic drug: Insulin Regular Human 3 units with  meals   OLANZapine 20 MG tablet Commonly known as: ZYPREXA Take 20 mg by mouth at bedtime.   omeprazole 40 MG capsule Commonly known as: PRILOSEC TAKE 1 CAPSULE BY MOUTH ONCE DAILY   ondansetron 8 MG disintegrating tablet Commonly known as: ZOFRAN-ODT Take 1 tablet (8 mg total) by mouth every 8 (eight) hours as needed for nausea or vomiting.   OneTouch Delica Lancets 99991111 Misc USE TO CHECK FASTING SUGAR TWICE DAILY   rosuvastatin 40 MG tablet Commonly known as: CRESTOR Take 1 tablet (40 mg total) by mouth daily.   sertraline 100 MG tablet Commonly known as: ZOLOFT Take 1 tablet by mouth daily.   Toujeo Max SoloStar 300 UNIT/ML Solostar Pen Generic drug: insulin glargine (2 Unit Dial) Inject 15 Units into the skin daily in the afternoon. What changed: how much to take   Ultracare Pen Needles 32G X 4 MM Misc Generic drug: Insulin Pen Needle Use to inject insulin 4 times daily   Vitamin D-3 25 MCG (1000 UT) Caps Take 1 capsule by mouth daily at 8 pm.         OBJECTIVE:   Vital Signs: Ht 5\' 8"  (1.727 m)   BMI 32.84 kg/m   Wt Readings from Last 3 Encounters:  01/24/23 216 lb (98 kg)  09/06/22 211 lb 8 oz (95.9 kg)  07/30/22 206 lb (93.4 kg)     Exam: General: Pt appears well and is in NAD  Neuro: MS is good with appropriate affect, pt is alert and Ox3     DATA REVIEWED:  Lab Results  Component Value Date   HGBA1C 7.9 (A) 05/31/2022   HGBA1C 10.8 (A) 01/05/2022   HGBA1C 11.9 (H) 12/11/2021   Lab Results  Component Value Date   MICROALBUR 10 06/05/2022    LDLCALC 42 07/20/2022   CREATININE 1.06 07/31/2022   Lab Results  Component Value Date   MICRALBCREAT <30 06/05/2022     Lab Results  Component Value Date   CHOL 113 07/20/2022   HDL 47 07/20/2022   LDLCALC 42 07/20/2022   TRIG 121 07/20/2022   CHOLHDL 2.4 07/20/2022         ASSESSMENT / PLAN / RECOMMENDATIONS:   1) Type 2 Diabetes Mellitus, Sub-Optimally controlled, Without complications -  Goal 123456 < 7.0 %.     -His GMI based on Dexcom is 7.4% -Patient has Hx of pancreatitis, hence not a candidate for DPP 4 inhibitors, GLP-1 agonist nor Mounjaro -He is doing well with the current regimen, he has been noted with hypoglycemia overnight, we will reduce his basal insulin as below -Last night he had a BG reading 350 mg/dL, patient believes he may have eaten a bowl of ice cream, counseled regarding CHO intake and the importance of avoiding sugar sweetened foods/drinks.  He also appears to have taken his regular insulin after lunch rather than before lunch, we again discussed the importance of taking regular insulin before the meal -We also reviewed the proper way of treating hypoglycemia  MEDICATIONS: Continue Jardiance 25 mg daily Decrease Toujeo 62 units daily Continue Novolin-R 10 units 3 times daily before every meal Continue Correction factor: Regular insulin (BG -130/25)  EDUCATION / INSTRUCTIONS: BG monitoring instructions: Patient is instructed to check his blood sugars 3 times a day, before each meal. Call [place] [facility] if: BG persistently < 70  I reviewed the Rule of 15 for the treatment of hypoglycemia in detail with the patient. Literature supplied.   2) Diabetic complications:  Eye: Does not have known diabetic retinopathy.  Neuro/ Feet: Does not have known diabetic peripheral neuropathy .  Renal: Patient does not have known baseline CKD. He   is not on an ACEI/ARB at present.   F/U in 6 months   Signed electronically by: Mack Guise, MD  [facility] Endocrinology  [facility] [address] [place], [place] [address] Phone: [phone] FAX: [phone]   CC: Pcp, No No address on file Phone: None  Fax: None  Return to Endocrinology clinic as below: No future appointments.

## 2023-01-31 NOTE — Telephone Encounter (Signed)
Please schedule patient for follow-up with me in 6 months for DM management   Thanks

## 2023-02-04 ENCOUNTER — Telehealth: Payer: Self-pay

## 2023-02-04 ENCOUNTER — Encounter: Payer: Self-pay | Admitting: Internal Medicine

## 2023-02-04 DIAGNOSIS — M170 Bilateral primary osteoarthritis of knee: Secondary | ICD-10-CM | POA: Diagnosis not present

## 2023-02-04 NOTE — Telephone Encounter (Signed)
Patient left a voicemail on Friday at 10:50am stating he needed someone to call him back regarding some pain medication.

## 2023-02-04 NOTE — Telephone Encounter (Signed)
[name], MD  to Shaun Many "Scott"      01/28/23 12:00 PM Hi,   Have gotten to my office and looked up your medication history through the  controlled substance database. It appears that you have had over 9 different Physicans writing you pain medications for the last 6 months with only one giving you 26 pills at once and this was the 2mg  dose. All the others, including [facility] and your family medicine doctor have not given you more then 10 pills. I see you have been denied by ortho and your family doc for refills and [facility] GI doc only gave you a few days suppy. I am sorry you had missed your previous pain management appointments but I have given you all the narcotics I feel conformable writing for you. Please follow up with pain management for any further medication management issues. Thank you  Last read by Shaun Many "Scott" at  3:35 PM on 02/03/2023

## 2023-02-06 ENCOUNTER — Telehealth: Payer: Self-pay | Admitting: Nutrition

## 2023-02-06 NOTE — Telephone Encounter (Signed)
Per CMA, patient was needing help linking dexcom to clarity.  I LVM to call me and gave him my number and the dexcom help line number to call for assistance.

## 2023-02-13 DIAGNOSIS — M25562 Pain in left knee: Secondary | ICD-10-CM | POA: Diagnosis not present

## 2023-02-13 DIAGNOSIS — E782 Mixed hyperlipidemia: Secondary | ICD-10-CM | POA: Diagnosis not present

## 2023-02-13 DIAGNOSIS — G8929 Other chronic pain: Secondary | ICD-10-CM | POA: Diagnosis not present

## 2023-02-13 DIAGNOSIS — G894 Chronic pain syndrome: Secondary | ICD-10-CM | POA: Diagnosis not present

## 2023-02-13 DIAGNOSIS — K219 Gastro-esophageal reflux disease without esophagitis: Secondary | ICD-10-CM | POA: Diagnosis not present

## 2023-02-13 DIAGNOSIS — Z79891 Long term (current) use of opiate analgesic: Secondary | ICD-10-CM | POA: Diagnosis not present

## 2023-02-13 DIAGNOSIS — I2699 Other pulmonary embolism without acute cor pulmonale: Secondary | ICD-10-CM | POA: Diagnosis not present

## 2023-02-13 DIAGNOSIS — M25561 Pain in right knee: Secondary | ICD-10-CM | POA: Diagnosis not present

## 2023-02-13 DIAGNOSIS — E119 Type 2 diabetes mellitus without complications: Secondary | ICD-10-CM | POA: Diagnosis not present

## 2023-02-13 DIAGNOSIS — M25569 Pain in unspecified knee: Secondary | ICD-10-CM | POA: Diagnosis not present

## 2023-02-13 DIAGNOSIS — M1710 Unilateral primary osteoarthritis, unspecified knee: Secondary | ICD-10-CM | POA: Diagnosis not present

## 2023-02-13 DIAGNOSIS — K861 Other chronic pancreatitis: Secondary | ICD-10-CM | POA: Diagnosis not present

## 2023-03-07 ENCOUNTER — Ambulatory Visit: Payer: Medicare Other | Admitting: Internal Medicine

## 2023-03-13 DIAGNOSIS — K861 Other chronic pancreatitis: Secondary | ICD-10-CM | POA: Diagnosis not present

## 2023-03-13 DIAGNOSIS — Z79891 Long term (current) use of opiate analgesic: Secondary | ICD-10-CM | POA: Diagnosis not present

## 2023-03-13 DIAGNOSIS — G894 Chronic pain syndrome: Secondary | ICD-10-CM | POA: Diagnosis not present

## 2023-03-13 DIAGNOSIS — M25569 Pain in unspecified knee: Secondary | ICD-10-CM | POA: Diagnosis not present

## 2023-03-13 DIAGNOSIS — E782 Mixed hyperlipidemia: Secondary | ICD-10-CM | POA: Diagnosis not present

## 2023-03-13 DIAGNOSIS — M25562 Pain in left knee: Secondary | ICD-10-CM | POA: Diagnosis not present

## 2023-03-13 DIAGNOSIS — K219 Gastro-esophageal reflux disease without esophagitis: Secondary | ICD-10-CM | POA: Diagnosis not present

## 2023-03-13 DIAGNOSIS — M1710 Unilateral primary osteoarthritis, unspecified knee: Secondary | ICD-10-CM | POA: Diagnosis not present

## 2023-03-13 DIAGNOSIS — E119 Type 2 diabetes mellitus without complications: Secondary | ICD-10-CM | POA: Diagnosis not present

## 2023-03-13 DIAGNOSIS — M25561 Pain in right knee: Secondary | ICD-10-CM | POA: Diagnosis not present

## 2023-03-13 DIAGNOSIS — G8929 Other chronic pain: Secondary | ICD-10-CM | POA: Diagnosis not present

## 2023-03-13 DIAGNOSIS — I2699 Other pulmonary embolism without acute cor pulmonale: Secondary | ICD-10-CM | POA: Diagnosis not present

## 2023-03-19 DIAGNOSIS — E782 Mixed hyperlipidemia: Secondary | ICD-10-CM | POA: Diagnosis not present

## 2023-03-19 DIAGNOSIS — K861 Other chronic pancreatitis: Secondary | ICD-10-CM | POA: Diagnosis not present

## 2023-03-19 DIAGNOSIS — Z0001 Encounter for general adult medical examination with abnormal findings: Secondary | ICD-10-CM | POA: Diagnosis not present

## 2023-03-19 DIAGNOSIS — K863 Pseudocyst of pancreas: Secondary | ICD-10-CM | POA: Diagnosis not present

## 2023-03-19 DIAGNOSIS — Z86711 Personal history of pulmonary embolism: Secondary | ICD-10-CM | POA: Diagnosis not present

## 2023-03-19 DIAGNOSIS — Z794 Long term (current) use of insulin: Secondary | ICD-10-CM | POA: Diagnosis not present

## 2023-03-19 DIAGNOSIS — E119 Type 2 diabetes mellitus without complications: Secondary | ICD-10-CM | POA: Diagnosis not present

## 2023-03-19 DIAGNOSIS — D696 Thrombocytopenia, unspecified: Secondary | ICD-10-CM | POA: Diagnosis not present

## 2023-03-21 DIAGNOSIS — I2699 Other pulmonary embolism without acute cor pulmonale: Secondary | ICD-10-CM | POA: Diagnosis not present

## 2023-03-22 ENCOUNTER — Other Ambulatory Visit: Payer: Self-pay | Admitting: Internal Medicine

## 2023-03-22 DIAGNOSIS — Z86711 Personal history of pulmonary embolism: Secondary | ICD-10-CM

## 2023-03-27 ENCOUNTER — Ambulatory Visit
Admission: RE | Admit: 2023-03-27 | Discharge: 2023-03-27 | Disposition: A | Discharge status: Home / Self Care | Payer: 59 | Source: Ambulatory Visit | Attending: Internal Medicine | Admitting: Internal Medicine

## 2023-03-27 DIAGNOSIS — Z86711 Personal history of pulmonary embolism: Secondary | ICD-10-CM | POA: Diagnosis not present

## 2023-04-02 ENCOUNTER — Encounter: Payer: Self-pay | Admitting: Internal Medicine

## 2023-04-05 DIAGNOSIS — M170 Bilateral primary osteoarthritis of knee: Secondary | ICD-10-CM | POA: Diagnosis not present

## 2023-04-10 ENCOUNTER — Encounter: Payer: Self-pay | Admitting: Internal Medicine

## 2023-04-10 DIAGNOSIS — G894 Chronic pain syndrome: Secondary | ICD-10-CM | POA: Diagnosis not present

## 2023-04-10 DIAGNOSIS — K861 Other chronic pancreatitis: Secondary | ICD-10-CM | POA: Diagnosis not present

## 2023-04-10 DIAGNOSIS — Z79899 Other long term (current) drug therapy: Secondary | ICD-10-CM | POA: Diagnosis not present

## 2023-04-10 DIAGNOSIS — E119 Type 2 diabetes mellitus without complications: Secondary | ICD-10-CM | POA: Diagnosis not present

## 2023-04-10 DIAGNOSIS — G8929 Other chronic pain: Secondary | ICD-10-CM | POA: Diagnosis not present

## 2023-04-10 DIAGNOSIS — I2699 Other pulmonary embolism without acute cor pulmonale: Secondary | ICD-10-CM | POA: Diagnosis not present

## 2023-04-10 DIAGNOSIS — E782 Mixed hyperlipidemia: Secondary | ICD-10-CM | POA: Diagnosis not present

## 2023-04-10 DIAGNOSIS — M1710 Unilateral primary osteoarthritis, unspecified knee: Secondary | ICD-10-CM | POA: Diagnosis not present

## 2023-04-10 DIAGNOSIS — Z79891 Long term (current) use of opiate analgesic: Secondary | ICD-10-CM | POA: Diagnosis not present

## 2023-04-10 DIAGNOSIS — M25562 Pain in left knee: Secondary | ICD-10-CM | POA: Diagnosis not present

## 2023-04-10 DIAGNOSIS — K219 Gastro-esophageal reflux disease without esophagitis: Secondary | ICD-10-CM | POA: Diagnosis not present

## 2023-04-10 DIAGNOSIS — M25561 Pain in right knee: Secondary | ICD-10-CM | POA: Diagnosis not present

## 2023-04-18 ENCOUNTER — Ambulatory Visit: Payer: 59 | Admitting: Internal Medicine

## 2023-05-08 ENCOUNTER — Encounter: Payer: Self-pay | Admitting: Internal Medicine

## 2023-05-10 ENCOUNTER — Other Ambulatory Visit: Payer: Self-pay

## 2023-05-10 DIAGNOSIS — E1165 Type 2 diabetes mellitus with hyperglycemia: Secondary | ICD-10-CM

## 2023-05-10 MED ORDER — ULTRACARE PEN NEEDLES 32G X 4 MM MISC
1.0000 | Freq: Four times a day (QID) | 3 refills | Status: DC
Start: 2023-05-10 — End: 2023-07-29

## 2023-05-21 DIAGNOSIS — G8929 Other chronic pain: Secondary | ICD-10-CM | POA: Diagnosis not present

## 2023-05-21 DIAGNOSIS — K861 Other chronic pancreatitis: Secondary | ICD-10-CM | POA: Diagnosis not present

## 2023-05-21 DIAGNOSIS — K219 Gastro-esophageal reflux disease without esophagitis: Secondary | ICD-10-CM | POA: Diagnosis not present

## 2023-05-21 DIAGNOSIS — Z79899 Other long term (current) drug therapy: Secondary | ICD-10-CM | POA: Diagnosis not present

## 2023-05-21 DIAGNOSIS — M1710 Unilateral primary osteoarthritis, unspecified knee: Secondary | ICD-10-CM | POA: Diagnosis not present

## 2023-05-21 DIAGNOSIS — Z79891 Long term (current) use of opiate analgesic: Secondary | ICD-10-CM | POA: Diagnosis not present

## 2023-05-21 DIAGNOSIS — I2699 Other pulmonary embolism without acute cor pulmonale: Secondary | ICD-10-CM | POA: Diagnosis not present

## 2023-05-21 DIAGNOSIS — M25561 Pain in right knee: Secondary | ICD-10-CM | POA: Diagnosis not present

## 2023-05-21 DIAGNOSIS — M25562 Pain in left knee: Secondary | ICD-10-CM | POA: Diagnosis not present

## 2023-05-21 DIAGNOSIS — G894 Chronic pain syndrome: Secondary | ICD-10-CM | POA: Diagnosis not present

## 2023-05-21 DIAGNOSIS — E119 Type 2 diabetes mellitus without complications: Secondary | ICD-10-CM | POA: Diagnosis not present

## 2023-05-21 DIAGNOSIS — E782 Mixed hyperlipidemia: Secondary | ICD-10-CM | POA: Diagnosis not present

## 2023-06-13 ENCOUNTER — Ambulatory Visit: Payer: 59 | Admitting: Internal Medicine

## 2023-06-17 DIAGNOSIS — G894 Chronic pain syndrome: Secondary | ICD-10-CM | POA: Diagnosis not present

## 2023-06-17 DIAGNOSIS — E119 Type 2 diabetes mellitus without complications: Secondary | ICD-10-CM | POA: Diagnosis not present

## 2023-06-17 DIAGNOSIS — E782 Mixed hyperlipidemia: Secondary | ICD-10-CM | POA: Diagnosis not present

## 2023-06-17 DIAGNOSIS — I2699 Other pulmonary embolism without acute cor pulmonale: Secondary | ICD-10-CM | POA: Diagnosis not present

## 2023-06-17 DIAGNOSIS — Z79891 Long term (current) use of opiate analgesic: Secondary | ICD-10-CM | POA: Diagnosis not present

## 2023-06-17 DIAGNOSIS — G8929 Other chronic pain: Secondary | ICD-10-CM | POA: Diagnosis not present

## 2023-06-17 DIAGNOSIS — M25562 Pain in left knee: Secondary | ICD-10-CM | POA: Diagnosis not present

## 2023-06-17 DIAGNOSIS — M1710 Unilateral primary osteoarthritis, unspecified knee: Secondary | ICD-10-CM | POA: Diagnosis not present

## 2023-06-17 DIAGNOSIS — K219 Gastro-esophageal reflux disease without esophagitis: Secondary | ICD-10-CM | POA: Diagnosis not present

## 2023-06-17 DIAGNOSIS — M25561 Pain in right knee: Secondary | ICD-10-CM | POA: Diagnosis not present

## 2023-06-17 DIAGNOSIS — K861 Other chronic pancreatitis: Secondary | ICD-10-CM | POA: Diagnosis not present

## 2023-06-21 ENCOUNTER — Ambulatory Visit: Payer: 59 | Admitting: Internal Medicine

## 2023-07-01 ENCOUNTER — Encounter: Payer: Self-pay | Admitting: Internal Medicine

## 2023-07-02 ENCOUNTER — Other Ambulatory Visit: Payer: Self-pay

## 2023-07-02 MED ORDER — DEXCOM G6 TRANSMITTER MISC: 1.0000 | 3 refills | Status: DC

## 2023-07-05 DIAGNOSIS — M1710 Unilateral primary osteoarthritis, unspecified knee: Secondary | ICD-10-CM | POA: Diagnosis not present

## 2023-07-05 DIAGNOSIS — E119 Type 2 diabetes mellitus without complications: Secondary | ICD-10-CM | POA: Diagnosis not present

## 2023-07-05 DIAGNOSIS — G8929 Other chronic pain: Secondary | ICD-10-CM | POA: Diagnosis not present

## 2023-07-05 DIAGNOSIS — G894 Chronic pain syndrome: Secondary | ICD-10-CM | POA: Diagnosis not present

## 2023-07-05 DIAGNOSIS — I2699 Other pulmonary embolism without acute cor pulmonale: Secondary | ICD-10-CM | POA: Diagnosis not present

## 2023-07-05 DIAGNOSIS — M25562 Pain in left knee: Secondary | ICD-10-CM | POA: Diagnosis not present

## 2023-07-05 DIAGNOSIS — K861 Other chronic pancreatitis: Secondary | ICD-10-CM | POA: Diagnosis not present

## 2023-07-05 DIAGNOSIS — K219 Gastro-esophageal reflux disease without esophagitis: Secondary | ICD-10-CM | POA: Diagnosis not present

## 2023-07-05 DIAGNOSIS — E782 Mixed hyperlipidemia: Secondary | ICD-10-CM | POA: Diagnosis not present

## 2023-07-05 DIAGNOSIS — M25561 Pain in right knee: Secondary | ICD-10-CM | POA: Diagnosis not present

## 2023-07-05 DIAGNOSIS — Z79891 Long term (current) use of opiate analgesic: Secondary | ICD-10-CM | POA: Diagnosis not present

## 2023-07-12 ENCOUNTER — Ambulatory Visit: Payer: 59 | Admitting: Pulmonary Disease

## 2023-07-15 ENCOUNTER — Ambulatory Visit: Payer: 59 | Admitting: Internal Medicine

## 2023-07-18 DIAGNOSIS — M25569 Pain in unspecified knee: Secondary | ICD-10-CM | POA: Diagnosis not present

## 2023-07-18 DIAGNOSIS — I2699 Other pulmonary embolism without acute cor pulmonale: Secondary | ICD-10-CM | POA: Diagnosis not present

## 2023-07-18 DIAGNOSIS — G894 Chronic pain syndrome: Secondary | ICD-10-CM | POA: Diagnosis not present

## 2023-07-18 DIAGNOSIS — M1710 Unilateral primary osteoarthritis, unspecified knee: Secondary | ICD-10-CM | POA: Diagnosis not present

## 2023-07-18 DIAGNOSIS — G8929 Other chronic pain: Secondary | ICD-10-CM | POA: Diagnosis not present

## 2023-07-18 DIAGNOSIS — M25561 Pain in right knee: Secondary | ICD-10-CM | POA: Diagnosis not present

## 2023-07-18 DIAGNOSIS — K219 Gastro-esophageal reflux disease without esophagitis: Secondary | ICD-10-CM | POA: Diagnosis not present

## 2023-07-18 DIAGNOSIS — E782 Mixed hyperlipidemia: Secondary | ICD-10-CM | POA: Diagnosis not present

## 2023-07-18 DIAGNOSIS — Z79891 Long term (current) use of opiate analgesic: Secondary | ICD-10-CM | POA: Diagnosis not present

## 2023-07-18 DIAGNOSIS — K861 Other chronic pancreatitis: Secondary | ICD-10-CM | POA: Diagnosis not present

## 2023-07-18 DIAGNOSIS — M25562 Pain in left knee: Secondary | ICD-10-CM | POA: Diagnosis not present

## 2023-07-18 DIAGNOSIS — E119 Type 2 diabetes mellitus without complications: Secondary | ICD-10-CM | POA: Diagnosis not present

## 2023-07-24 ENCOUNTER — Ambulatory Visit: Payer: 59 | Admitting: Internal Medicine

## 2023-07-29 ENCOUNTER — Encounter: Payer: Self-pay | Admitting: Internal Medicine

## 2023-07-29 ENCOUNTER — Ambulatory Visit (INDEPENDENT_AMBULATORY_CARE_PROVIDER_SITE_OTHER): Payer: 59 | Admitting: Internal Medicine

## 2023-07-29 VITALS — BP 136/80 | HR 80 | Ht 68.0 in | Wt 204.0 lb

## 2023-07-29 DIAGNOSIS — Z7984 Long term (current) use of oral hypoglycemic drugs: Secondary | ICD-10-CM | POA: Diagnosis not present

## 2023-07-29 DIAGNOSIS — E1165 Type 2 diabetes mellitus with hyperglycemia: Secondary | ICD-10-CM | POA: Insufficient documentation

## 2023-07-29 LAB — POCT GLYCOSYLATED HEMOGLOBIN (HGB A1C): Hemoglobin A1C: 7.6 % — AB (ref 4.0–5.6)

## 2023-07-29 MED ORDER — EMPAGLIFLOZIN 25 MG PO TABS: 25.0000 mg | ORAL_TABLET | Freq: Every day | ORAL | 3 refills | Status: DC

## 2023-07-29 MED ORDER — ULTRACARE PEN NEEDLES 32G X 4 MM MISC: 1.0000 | Freq: Four times a day (QID) | 3 refills | Status: DC

## 2023-07-29 MED ORDER — INSULIN LISPRO (1 UNIT DIAL) 100 UNIT/ML (KWIKPEN): PEN_INJECTOR | SUBCUTANEOUS | 3 refills | Status: DC

## 2023-07-29 MED ORDER — TOUJEO MAX SOLOSTAR 300 UNIT/ML ~~LOC~~ SOPN: 60.0000 [IU] | PEN_INJECTOR | Freq: Every day | SUBCUTANEOUS | 4 refills | Status: DC

## 2023-07-29 NOTE — Progress Notes (Signed)
Name: [name]  Age/ Sex: 57 y.o., male   MRN/ DOB: [id number], Feb 05, 1966     PCP: Pcp, No   Reason for Endocrinology Evaluation: Type 2 Diabetes Mellitus  Initial Endocrine Consultative Visit: 01/05/2022    PATIENT IDENTIFIER: Shaun Odonnell is a 57 y.o. male with a past medical history of T2DM, Hx chronic pancreatitis, schizoaffective disorder, Hx ETOH abuse. The patient has followed with Endocrinology clinic since 01/05/2022 for consultative assistance with management of his diabetes.  DIABETIC HISTORY:  Shaun Odonnell was diagnosed with DM 2006, and started insulin therapy in 2018. His hemoglobin A1c has ranged from 11.9% in 2023, peaking at 12.1% in 2022.   SUBJECTIVE:   During the last visit (05/22/2022): A1c 7.9%   Today (07/29/2023): Shaun Odonnell is here for follow-up on diabetes management.  He checks his blood sugars multiple  times daily, through CGM. The patient has  had hypoglycemic episodes .  Pt follows with the hematology clinic for PE, which he was diagnosed with in 08/2022 during evaluation for acute pancreatitis.    He follows with his GI Dr. Ellison Carwin  Has nausea and insomnia that he attributes to opiate withdrawal  Awaiting knee sx  Denies constipation or diarrhea    HOME DIABETES REGIMEN:  Jardiance 25 mg daily Toujeo 62 units daily Novolin-R 10 units 3 times daily before every meal Correction factor: Novolin-R (BG -130/25)   Statin: Yes ACE-I/ARB: No  CONTINUOUS GLUCOSE MONITORING RECORD INTERPRETATION    Dates of Recording: 9/10-9/23/2024  Sensor description: Dexcom  Results statistics:   CGM use % of time 93%  Average and SD 195/73  Time in range  51%  % Time Above 180 29  % Time above 250 20  % Time Below target 0   Glycemic patterns summary: BGs fluctuate during the day and night  Hyperglycemic episodes postprandial  Hypoglycemic episodes occurred overnight  Overnight periods: Variable    DIABETIC  COMPLICATIONS: Microvascular complications:  Cataract sx  Denies: CKD, neuropathy  Last Eye Exam: Completed 06/2021  Macrovascular complications:   Denies: CAD, CVA, PVD   HISTORY:  Past Medical History:  Past Medical History:  Diagnosis Date   Alcohol abuse 04/29/2019   Allergy    Anxiety    ARDS (adult respiratory distress syndrome) (HCC)    Asthma    Bipolar disorder (HCC)    Cataract    Chronic kidney disease    per pt - no current problems   COPD (chronic obstructive pulmonary disease) (HCC)    chronic cough and wheezing   Depression    Diabetes mellitus without complication (HCC)    type 2   Dizziness    medication related   Dyspnea    with exertion   Elevated lipids    Fatty liver    Hypercholesterolemia    Hypertension    per pt, no current prob-no meds   Pancreatitis    Pneumonia    in past   Pulmonary embolism (HCC)    Schizoaffective disorder (HCC)    Past Surgical History:  Past Surgical History:  Procedure Laterality Date   BIOPSY  08/31/2021   Procedure: BIOPSY;  Surgeon: Meridee Score Netty Starring., MD;  Location: [facility] ENDOSCOPY;  Service: Gastroenterology;;   CATARACT EXTRACTION W/PHACO Right 03/26/2018   Procedure: CATARACT EXTRACTION PHACO AND INTRAOCULAR LENS PLACEMENT (IOC) RIGHT BORDERLINE DIABETIC;  Surgeon: Lockie Mola, MD;  Location: [facility]ERY [facility];  Service: Ophthalmology;  Laterality: Right;  CATARACT EXTRACTION W/PHACO Left 04/23/2018   Procedure: CATARACT EXTRACTION PHACO AND INTRAOCULAR LENS PLACEMENT (IOC)  LEFT BORDERLINE DIABETIC;  Surgeon: Lockie Mola, MD;  Location: [facility] SURGERY CNTR;  Service: Ophthalmology;  Laterality: Left;   COLONOSCOPY     COLONOSCOPY WITH PROPOFOL N/A 08/21/2017   Procedure: COLONOSCOPY WITH PROPOFOL;  Surgeon: Scot Jun, MD;  Location: [facility] ENDOSCOPY;  Service: Endoscopy;  Laterality: N/A;   COLONOSCOPY WITH PROPOFOL N/A 12/15/2021   Procedure: COLONOSCOPY WITH BIOPSY;   Surgeon: Midge Minium, MD;  Location: [facility] SURGERY CNTR;  Service: Endoscopy;  Laterality: N/A;  Diabetic   ENDOSCOPIC RETROGRADE CHOLANGIOPANCREATOGRAPHY (ERCP) WITH PROPOFOL N/A 08/31/2021   Procedure: ENDOSCOPIC RETROGRADE CHOLANGIOPANCREATOGRAPHY (ERCP) WITH PROPOFOL;  Surgeon: Meridee Score Netty Starring., MD;  Location: [facility] ENDOSCOPY;  Service: Gastroenterology;  Laterality: N/A;   ESOPHAGOGASTRODUODENOSCOPY (EGD) WITH PROPOFOL N/A 08/31/2021   Procedure: ESOPHAGOGASTRODUODENOSCOPY (EGD) WITH PROPOFOL;  Surgeon: Meridee Score Netty Starring., MD;  Location: [facility] ENDOSCOPY;  Service: Gastroenterology;  Laterality: N/A;   EYE SURGERY     FINE NEEDLE ASPIRATION N/A 08/31/2021   Procedure: FINE NEEDLE ASPIRATION (FNA) LINEAR;  Surgeon: Lemar Lofty., MD;  Location: [facility] ENDOSCOPY;  Service: Gastroenterology;  Laterality: N/A;   HERNIA REPAIR     inguinal and umbilical   POLYPECTOMY N/A 12/15/2021   Procedure: POLYPECTOMY;  Surgeon: Midge Minium, MD;  Location: [facility]ERY [facility];  Service: Endoscopy;  Laterality: N/A;   RIB RESECTION N/A 08/20/2019   Procedure: removal of xyphoid process;  Surgeon: Hulda Marin, MD;  Location: [facility] ORS;  Service: Thoracic;  Laterality: N/A;   SPHINCTEROTOMY  08/31/2021   Procedure: Dennison Mascot;  Surgeon: Mansouraty, Netty Starring., MD;  Location: [facility] ENDOSCOPY;  Service: Gastroenterology;;   STENT REMOVAL     UMBILICAL HERNIA REPAIR N/A 06/22/2019   Procedure: OPEN HERNIA REPAIR UMBILICAL ADULT;  Surgeon: Henrene Dodge, MD;  Location: [facility] ORS;  Service: General;  Laterality: N/A;   UPPER ESOPHAGEAL ENDOSCOPIC ULTRASOUND (EUS) N/A 08/31/2021   Procedure: UPPER ESOPHAGEAL ENDOSCOPIC ULTRASOUND (EUS);  Surgeon: Lemar Lofty., MD;  Location: [facility] ENDOSCOPY;  Service: Gastroenterology;  Laterality: N/A;   UPPER GI ENDOSCOPY     Social History:  reports that he quit smoking about 15 months ago. His smoking use included cigarettes. He started smoking about 35  years ago. He has a 17 pack-year smoking history. He quit smokeless tobacco use about 34 years ago.  His smokeless tobacco use included snuff. He reports current alcohol use of about 6.0 standard drinks of alcohol per week. He reports that he does not currently use drugs. Family History:  Family History  Problem Relation Age of Onset   Hyperlipidemia Mother    Hypertension Father    Stroke Father    Diabetes Maternal Aunt    Dementia Maternal Grandmother    Heart disease Maternal Grandfather    Heart disease Paternal Grandmother    Stroke Paternal Grandfather    Diabetes Paternal Grandfather    Colon cancer Neg Hx    Esophageal cancer Neg Hx    Inflammatory bowel disease Neg Hx    Liver disease Neg Hx    Pancreatic cancer Neg Hx    Stomach cancer Neg Hx    Rectal cancer Neg Hx      HOME MEDICATIONS: Allergies as of 07/29/2023       Reactions   Fentanyl Nausea And Vomiting   Morphine And Codeine Shortness Of Breath   Neurontin [gabapentin] Other (See Comments)   Caused severe over sedation.  Loss  of faculties   Clonazepam Other (See Comments)   Other reaction(s): "Dystonic"; alprazolam is a home med Dystonic Reaction   Pimozide Other (See Comments)   Other reaction(s): "Distonic" Dystonic Reaction   Trifluoperazine Other (See Comments)   Other (See Comments) "Distonic" Dystonic Reaction   Azithromycin Other (See Comments)   Was told not to take Z pak due to his heart   Montelukast    headache, nausea, feeling a little anxious, panic   Morphine Other (See Comments)   Other reaction(s): " I stop breathing"; can take Diluadid Cnnot tolerate d/t Drug Metabolism   Hydrocodone Anxiety, Palpitations   Tramadol Nausea Only        Medication List        Accurate as of July 29, 2023  7:58 AM. If you have any questions, ask your nurse or doctor.          STOP taking these medications    acetaminophen 500 MG tablet Commonly known as: TYLENOL Stopped by:  Johnney Ou Rickia Freeburg   celecoxib 200 MG capsule Commonly known as: CELEBREX Stopped by: Johnney Ou Joud Ingwersen   Eszopiclone 3 MG Tabs Stopped by: Johnney Ou Zia Najera   GNP Insulin Syringes 30G X 5/16" 1 ML Misc Generic drug: Insulin Syringe-Needle U-100 Stopped by: Johnney Ou Marlina Cataldi       TAKE these medications    albuterol 0.63 MG/3ML nebulizer solution Commonly known as: ACCUNEB Take 1 ampule by nebulization every 6 (six) hours as needed for wheezing.   albuterol 108 (90 Base) MCG/ACT inhaler Commonly known as: VENTOLIN HFA Inhale 2 puffs into the lungs every 6 (six) hours as needed for wheezing or shortness of breath.   ALPRAZolam 0.5 MG tablet Commonly known as: XANAX Take 0.5 mg by mouth 4 (four) times daily. Patient takes when wakes up(~0600)/ 1000/ 1600/ 2000   budesonide-formoterol 80-4.5 MCG/ACT inhaler Commonly known as: Symbicort Inhale 2 puffs into the lungs in the morning and at bedtime.   Creon 36000-114000 units Cpep capsule Generic drug: lipase/protease/amylase TAKE 2 CAPSULES BY MOUTH 3 TIMES DAILY WITH MEALS MAY ALSO TAKE 1 CAPSULE AS NEEDED WITH SNACKS   Dexcom G6 Sensor Misc 1 Device by Does not apply route See admin instructions. Change every 10 days   Dexcom G6 Transmitter Misc 1 Device by Other route as directed. Every 10 days   Eliquis 5 MG Tabs tablet Generic drug: apixaban Take 2.5 mg by mouth 2 (two) times daily.   empagliflozin 25 MG Tabs tablet Commonly known as: Jardiance Take 1 tablet (25 mg total) by mouth daily.   fenofibrate 145 MG tablet Commonly known as: TRICOR Take 1 tablet (145 mg total) by mouth daily.   glucose blood test strip Use as instructed   HYDROmorphone 4 MG tablet Commonly known as: DILAUDID Take by mouth.   HYDROmorphone 4 MG tablet Commonly known as: Dilaudid Take 1 tablet (4 mg total) by mouth every 6 (six) hours as needed for severe pain.   NovoLIN R FlexPen ReliOn 100 UNIT/ML FlexPen Generic  drug: Insulin Regular Human Max Daily 45 units What changed: additional instructions   OLANZapine 20 MG tablet Commonly known as: ZYPREXA Take 20 mg by mouth at bedtime.   omeprazole 40 MG capsule Commonly known as: PRILOSEC TAKE 1 CAPSULE BY MOUTH ONCE DAILY   ondansetron 8 MG disintegrating tablet Commonly known as: ZOFRAN-ODT Take 1 tablet (8 mg total) by mouth every 8 (eight) hours as needed for nausea or vomiting.   OneTouch Delica Lancets  33G Misc USE TO CHECK FASTING SUGAR TWICE DAILY   Prevnar 20 0.5 ML injection Generic drug: pneumococcal 20-valent conjugate vaccine   rosuvastatin 40 MG tablet Commonly known as: CRESTOR Take 1 tablet (40 mg total) by mouth daily.   sertraline 100 MG tablet Commonly known as: ZOLOFT Take 1 tablet by mouth daily.   Toujeo Max SoloStar 300 UNIT/ML Solostar Pen Generic drug: insulin glargine (2 Unit Dial) Inject 62 Units into the skin daily in the afternoon.   Ultracare Pen Needles 32G X 4 MM Misc Generic drug: Insulin Pen Needle 1 Device by Other route in the morning, at noon, in the evening, and at bedtime. Use to inject insulin 4 times daily   Vitamin D-3 25 MCG (1000 UT) Caps Take 1 capsule by mouth daily at 8 pm.         OBJECTIVE:   Vital Signs: BP 136/80 (BP Location: Left Arm, Patient Position: Sitting, Cuff Size: Large)   Pulse 80   Ht 5\' 8"  (1.727 m)   Wt 204 lb (92.5 kg)   SpO2 99%   BMI 31.02 kg/m   Wt Readings from Last 3 Encounters:  07/29/23 204 lb (92.5 kg)  01/24/23 216 lb (98 kg)  09/06/22 211 lb 8 oz (95.9 kg)     Exam: General: Pt appears well and is in NAD  Chest : CTA  Heart: RRR  Neuro: MS is good with appropriate affect, pt is alert and Ox3   DM Foot Exam 07/29/2023  The skin of the feet is intact without sores or ulcerations. The pedal pulses are 2+ on right and 2+ on left. The sensation is intact to a screening 5.07, 10 gram monofilament bilaterally    DATA REVIEWED:  Lab  Results  Component Value Date   HGBA1C 7.6 (A) 07/29/2023   HGBA1C 7.9 (A) 05/31/2022   HGBA1C 10.8 (A) 01/05/2022   Labs@Care  Everywhere  03/19/2023  Na 139 K 4.3 BUN 13 CR 0.59 GFR >90  A1c 7.8% HDL 42 LDL 63 TG 89  Old records , labs and images have been reviewed.    ASSESSMENT / PLAN / RECOMMENDATIONS:   1) Type 2 Diabetes Mellitus, Sub-Optimally controlled, Without complications -  A1c 7.6 %, Goal A1c < 7.0 %.     -A1c continues to trend down but remains above goal -Patient has Hx of pancreatitis, hence not a candidate for DPP 4 inhibitors, GLP-1 agonist nor Mounjaro -In reviewing his CGM data, patient has been noted with hypoglycemia overnight, I will decrease Toujeo as below -I will also switch Novolin R to Humalog, he was encouraged to continue take it before each meal and use correction scale as needed  MEDICATIONS: Continue Jardiance 25 mg daily Decrease Toujeo 60 units daily Switch Novolin-R to Humalog , take 10 units 3 times daily before every meal Continue Correction factor: Humalog (BG -130/25) TIDQAC  EDUCATION / INSTRUCTIONS: BG monitoring instructions: Patient is instructed to check his blood sugars 3 times a day, before each meal. Call [facility] if: BG persistently < 70  I reviewed the Rule of 15 for the treatment of hypoglycemia in detail with the patient. Literature supplied.   2) Diabetic complications:  Eye: Does not have known diabetic retinopathy.  Neuro/ Feet: Does not have known diabetic peripheral neuropathy .  Renal: Patient does not have known baseline CKD. He   is not on an ACEI/ARB at present.   F/U in 6 months   Signed electronically by: Lamount Cranker  Lonzo Cloud, MD  [facility] Endocrinology  [facility] [address] [place], [place] [address] Phone: [phone] FAX: [phone]   CC: Pcp, No No address on file Phone: None  Fax: None  Return to Endocrinology clinic as  below: Future Appointments  Date Time Provider Department Center  08/05/2023 10:00 AM Raechel Chute, MD [id number] None

## 2023-07-29 NOTE — Patient Instructions (Addendum)
Continue Jardiance 25 mg daily  Decrease Toujeo 60 units ONCE daily  Switch  Novolin -R ( Regular ) to HUMALOG, take  10 units Before each meal  Huamlog correctional insulin: ADD extra units on insulin to your meal-time Regular insulin  dose if your blood sugars are higher than 155. Use the scale below to help guide you:   Blood sugar before meal Number of units to inject  Less than 155 0 unit  156 -  180 1 units  181 -  205 2 units  206 -  230 3 units  231 -  255 4 units  256 -  280 5 units  281 -  305 6 units  306 -  330 7 units  331 -  355 8 units  256 - 380 10 units

## 2023-07-30 ENCOUNTER — Encounter: Payer: Self-pay | Admitting: Internal Medicine

## 2023-07-31 DIAGNOSIS — G894 Chronic pain syndrome: Secondary | ICD-10-CM | POA: Diagnosis not present

## 2023-07-31 DIAGNOSIS — K861 Other chronic pancreatitis: Secondary | ICD-10-CM | POA: Diagnosis not present

## 2023-07-31 DIAGNOSIS — M25561 Pain in right knee: Secondary | ICD-10-CM | POA: Diagnosis not present

## 2023-07-31 DIAGNOSIS — M25562 Pain in left knee: Secondary | ICD-10-CM | POA: Diagnosis not present

## 2023-07-31 DIAGNOSIS — E782 Mixed hyperlipidemia: Secondary | ICD-10-CM | POA: Diagnosis not present

## 2023-07-31 DIAGNOSIS — E119 Type 2 diabetes mellitus without complications: Secondary | ICD-10-CM | POA: Diagnosis not present

## 2023-07-31 DIAGNOSIS — I2699 Other pulmonary embolism without acute cor pulmonale: Secondary | ICD-10-CM | POA: Diagnosis not present

## 2023-07-31 DIAGNOSIS — G8929 Other chronic pain: Secondary | ICD-10-CM | POA: Diagnosis not present

## 2023-07-31 DIAGNOSIS — K219 Gastro-esophageal reflux disease without esophagitis: Secondary | ICD-10-CM | POA: Diagnosis not present

## 2023-07-31 DIAGNOSIS — Z79891 Long term (current) use of opiate analgesic: Secondary | ICD-10-CM | POA: Diagnosis not present

## 2023-07-31 DIAGNOSIS — M1710 Unilateral primary osteoarthritis, unspecified knee: Secondary | ICD-10-CM | POA: Diagnosis not present

## 2023-08-05 ENCOUNTER — Ambulatory Visit: Payer: 59 | Admitting: Student in an Organized Health Care Education/Training Program

## 2023-08-22 DIAGNOSIS — M25562 Pain in left knee: Secondary | ICD-10-CM | POA: Diagnosis not present

## 2023-08-22 DIAGNOSIS — M25569 Pain in unspecified knee: Secondary | ICD-10-CM | POA: Diagnosis not present

## 2023-08-22 DIAGNOSIS — Z79891 Long term (current) use of opiate analgesic: Secondary | ICD-10-CM | POA: Diagnosis not present

## 2023-08-22 DIAGNOSIS — E119 Type 2 diabetes mellitus without complications: Secondary | ICD-10-CM | POA: Diagnosis not present

## 2023-08-22 DIAGNOSIS — K219 Gastro-esophageal reflux disease without esophagitis: Secondary | ICD-10-CM | POA: Diagnosis not present

## 2023-08-22 DIAGNOSIS — M25561 Pain in right knee: Secondary | ICD-10-CM | POA: Diagnosis not present

## 2023-08-22 DIAGNOSIS — K861 Other chronic pancreatitis: Secondary | ICD-10-CM | POA: Diagnosis not present

## 2023-08-22 DIAGNOSIS — I2699 Other pulmonary embolism without acute cor pulmonale: Secondary | ICD-10-CM | POA: Diagnosis not present

## 2023-08-22 DIAGNOSIS — G8929 Other chronic pain: Secondary | ICD-10-CM | POA: Diagnosis not present

## 2023-08-22 DIAGNOSIS — M1710 Unilateral primary osteoarthritis, unspecified knee: Secondary | ICD-10-CM | POA: Diagnosis not present

## 2023-08-22 DIAGNOSIS — G894 Chronic pain syndrome: Secondary | ICD-10-CM | POA: Diagnosis not present

## 2023-08-22 DIAGNOSIS — E782 Mixed hyperlipidemia: Secondary | ICD-10-CM | POA: Diagnosis not present

## 2023-08-27 DIAGNOSIS — Z79891 Long term (current) use of opiate analgesic: Secondary | ICD-10-CM | POA: Diagnosis not present

## 2023-08-28 NOTE — Progress Notes (Addendum)
PCP - Ardyth Gal LOV 03-19-23 epic   clearance Dr. Lonzo Cloud on chart 08-02-23  endocrinologist LOV note 07-29-23 on chart Cardiologist -  LOV 2019 Arnoldo Hooker, MD  PPM/ICD -  Device Orders -  Rep Notified -   Chest x-ray -  EKG - 09-05-23  Stress Test -  ECHO -  Cardiac Cath -  hgbA1c- 07-29-23 epic   Sleep Study -  CPAP -   Fasting Blood Sugar - 125 Checks Blood Sugar __DEXCOM___  Blood Thinner Instructions:  elequis stop 2 days prior  managed by  Dr. Janie Morning Hematology  LOV 03-21-23 CE Aspirin Instructions:  ERAS Protcol - PRE-SURGERY  G2-    COVID vaccine -yes0  Activity--Able to climb a flight of stairs without CP or SOB  Anesthesia review: Asthma, HTN,,DM2, PE  Patient denies shortness of breath, fever, cough and chest pain at PAT appointment   All instructions explained to the patient, with a verbal understanding of the material. Patient agrees to go over the instructions while at home for a better understanding. Patient also instructed to self quarantine after being tested for COVID-19. The opportunity to ask questions was provided.

## 2023-08-28 NOTE — Patient Instructions (Addendum)
SURGICAL WAITING ROOM VISITATION  Patients having surgery or a procedure may have no more than 2 support people in the waiting area - these visitors may rotate.    Children under the age of 27 must have an adult with them who is not the patient.  Due to an increase in RSV and influenza rates and associated hospitalizations, children ages 36 and under may not visit patients in [facility] hospitals.  If the patient needs to stay at the hospital during part of their recovery, the visitor guidelines for inpatient rooms apply. Pre-op nurse will coordinate an appropriate time for 1 support person to accompany patient in pre-op.  This support person may not rotate.    Please refer to the [facility] website for the visitor guidelines for Inpatients (after your surgery is over and you are in a regular room).       Your procedure is scheduled on: 09-17-23   Report to [facility] Main Entrance    Report to admitting at       0700  AM   Call this number if you have problems the morning of surgery [phone]   Do not eat food :After Midnight.   After Midnight you may have the following liquids until 0630____ AM  DAY OF SURGERY   then nothing by mouth  Water Non-Citrus Juices (without pulp, NO RED-Apple, White grape, White cranberry) Black Coffee (NO MILK/CREAM OR CREAMERS, sugar ok)  Clear Tea (NO MILK/CREAM OR CREAMERS, sugar ok) regular and decaf                             Plain Jell-O (NO RED)                                           Fruit ices (not with fruit pulp, NO RED)                                     Popsicles (NO RED)                                                               Sports drinks like Gatorade (NO RED)                   The day of surgery:  Drink ONE (1) Pre-Surgery  G2 at      0615  AM the morning of surgery. Drink in one sitting. Do not sip.  This drink was given to you during your hospital  pre-op appointment visit. Nothing else to drink after  completing the  G2.by 0630 am           If you have questions, please contact your surgeon's office.   FOLLOW ANY ADDITIONAL PRE OP INSTRUCTIONS YOU RECEIVED FROM YOUR SURGEON'S OFFICE!!!     Oral Hygiene is also important to reduce your risk of infection.  Remember - BRUSH YOUR TEETH THE MORNING OF SURGERY WITH YOUR REGULAR TOOTHPASTE  DENTURES WILL BE REMOVED PRIOR TO SURGERY PLEASE DO NOT APPLY "Poly grip" OR ADHESIVES!!!   Do NOT smoke after Midnight   Stop all vitamins and herbal supplements 7 days before surgery.   Take these medicines the morning of surgery with A SIP OF WATER: Sertraline, omeprazole, fenofibrate, xanax if needed, hydromorphone if needed  DO NOT TAKE ANY ORAL DIABETIC MEDICATIONS DAY OF YOUR SURGERY  How to Manage Your Diabetes Before and After Surgery  Why is it important to control my blood sugar before and after surgery? Improving blood sugar levels before and after surgery helps healing and can limit problems. A way of improving blood sugar control is eating a healthy diet by:  Eating less sugar and carbohydrates  Increasing activity/exercise  Talking with your doctor about reaching your blood sugar goals High blood sugars (greater than 180 mg/dL) can raise your risk of infections and slow your recovery, so you will need to focus on controlling your diabetes during the weeks before surgery. Make sure that the doctor who takes care of your diabetes knows about your planned surgery including the date and location.  How do I manage my blood sugar before surgery? Check your blood sugar at least 4 times a day, starting 2 days before surgery, to make sure that the level is not too high or low. Check your blood sugar the morning of your surgery when you wake up and every 2 hours until you get to the Short Stay unit. If your blood sugar is less than 70 mg/dL, you will need to treat for low blood sugar: Do not take  insulin. Treat a low blood sugar (less than 70 mg/dL) with  cup of clear juice (cranberry or apple), 4 glucose tablets, OR glucose gel. Recheck blood sugar in 15 minutes after treatment (to make sure it is greater than 70 mg/dL). If your blood sugar is not greater than 70 mg/dL on recheck, call [phone] for further instructions. Report your blood sugar to the short stay nurse when you get to Short Stay.  If you are admitted to the hospital after surgery: Your blood sugar will be checked by the staff and you will probably be given insulin after surgery (instead of oral diabetes medicines) to make sure you have good blood sugar levels. The goal for blood sugar control after surgery is 80-180 mg/dL.   WHAT DO I DO ABOUT MY DIABETES MEDICATION?  Do not take oral diabetes medicines (pills) the morning of surgery.   TOUJEO   DAY BEFORE SURGERY TAKE 100% OF AM DOSE ONLY                    DAY OF SURGERY TAKE 50% OF AM DOSE IF YOU TAKE IT IN THE AM                    JARDIANCE HOLD 72 HOURS BEFORE SURGERY LAST DOSE 09-13-23   DO NOT TAKE THE FOLLOWING 7 DAYS PRIOR TO SURGERY: Ozempic, Wegovy, Rybelsus (Semaglutide), Byetta (exenatide), Bydureon (exenatide ER), Victoza, Saxenda (liraglutide), or Trulicity (dulaglutide) Mounjaro (Tirzepatide) Adlyxin (Lixisenatide), Polyethylene Glycol Loxenatide.  If your CBG is greater than 220 mg/dL, you may take  of your sliding scale  (correction) dose of insulin.      Bring CPAP mask and tubing day of surgery.  You may not have any metal on your body including hair pins, jewelry, and body piercing             Do not wear lotions, powders, perfumes/cologne, or deodorant              Men may shave face and neck.   Do not bring valuables to the hospital. [facility] IS NOT             RESPONSIBLE   FOR VALUABLES.   Contacts, glasses, dentures or bridgework may not be worn into surgery.   Bring small overnight bag  day of surgery.   DO NOT BRING YOUR HOME MEDICATIONS TO THE HOSPITAL. PHARMACY WILL DISPENSE MEDICATIONS LISTED ON YOUR MEDICATION LIST TO YOU DURING YOUR ADMISSION IN THE HOSPITAL!    Patients discharged on the day of surgery will not be allowed to drive home.  Someone NEEDS to stay with you for the first 24 hours after anesthesia.   Special Instructions: Bring a copy of your healthcare power of attorney and living will documents the day of surgery if you haven't scanned them before.              Please read over the following fact sheets you were given: IF YOU HAVE QUESTIONS ABOUT YOUR PRE-OP INSTRUCTIONS PLEASE CALL [phone]   . If you test positive for Covid or have been in contact with anyone that has tested positive in the last 10 days please notify you surgeon.      Pre-operative 5 CHG Bath Instructions   You can play a key role in reducing the risk of infection after surgery. Your skin needs to be as free of germs as possible. You can reduce the number of germs on your skin by washing with CHG (chlorhexidine gluconate) soap before surgery. CHG is an antiseptic soap that kills germs and continues to kill germs even after washing.   DO NOT use if you have an allergy to chlorhexidine/CHG or antibacterial soaps. If your skin becomes reddened or irritated, stop using the CHG and notify one of our RNs at [phone].   Please shower with the CHG soap starting 4 days before surgery using the following schedule:     Please keep in mind the following:  DO NOT shave, including legs and underarms, starting the day of your first shower.   You may shave your face at any point before/day of surgery.  Place clean sheets on your bed the day you start using CHG soap. Use a clean washcloth (not used since being washed) for each shower. DO NOT sleep with pets once you start using the CHG.   CHG Shower Instructions:  If you choose to wash your hair and private area, wash first with your  normal shampoo/soap.  After you use shampoo/soap, rinse your hair and body thoroughly to remove shampoo/soap residue.  Turn the water OFF and apply about 3 tablespoons (45 ml) of CHG soap to a CLEAN washcloth.  Apply CHG soap ONLY FROM YOUR NECK DOWN TO YOUR TOES (washing for 3-5 minutes)  DO NOT use CHG soap on face, private areas, open wounds, or sores.  Pay special attention to the area where your surgery is being performed.  If you are having back surgery, having someone wash your back for you may be helpful. Wait 2 minutes after CHG soap is applied, then you may rinse off the CHG soap.  Pat dry with a clean towel  Put on clean clothes/pajamas  If you choose to wear lotion, please use ONLY the CHG-compatible lotions on the back of this paper.     Additional instructions for the day of surgery: DO NOT APPLY any lotions, deodorants, cologne, or perfumes.   Put on clean/comfortable clothes.  Brush your teeth.  Ask your nurse before applying any prescription medications to the skin.      CHG Compatible Lotions   Aveeno Moisturizing lotion  Cetaphil Moisturizing Cream  Cetaphil Moisturizing Lotion  Clairol Herbal Essence Moisturizing Lotion, Dry Skin  Clairol Herbal Essence Moisturizing Lotion, Extra Dry Skin  Clairol Herbal Essence Moisturizing Lotion, Normal Skin  Curel Age Defying Therapeutic Moisturizing Lotion with Alpha Hydroxy  Curel Extreme Care Body Lotion  Curel Soothing Hands Moisturizing Hand Lotion  Curel Therapeutic Moisturizing Cream, Fragrance-Free  Curel Therapeutic Moisturizing Lotion, Fragrance-Free  Curel Therapeutic Moisturizing Lotion, Original Formula  Eucerin Daily Replenishing Lotion  Eucerin Dry Skin Therapy Plus Alpha Hydroxy Crme  Eucerin Dry Skin Therapy Plus Alpha Hydroxy Lotion  Eucerin Original Crme  Eucerin Original Lotion  Eucerin Plus Crme Eucerin Plus Lotion  Eucerin TriLipid Replenishing Lotion  Keri Anti-Bacterial Hand Lotion  Keri  Deep Conditioning Original Lotion Dry Skin Formula Softly Scented  Keri Deep Conditioning Original Lotion, Fragrance Free Sensitive Skin Formula  Keri Lotion Fast Absorbing Fragrance Free Sensitive Skin Formula  Keri Lotion Fast Absorbing Softly Scented Dry Skin Formula  Keri Original Lotion  Keri Skin Renewal Lotion Keri Silky Smooth Lotion  Keri Silky Smooth Sensitive Skin Lotion  Nivea Body Creamy Conditioning Oil  Nivea Body Extra Enriched Lotion  Nivea Body Original Lotion  Nivea Body Sheer Moisturizing Lotion Nivea Crme  Nivea Skin Firming Lotion  NutraDerm 30 Skin Lotion  NutraDerm Skin Lotion  NutraDerm Therapeutic Skin Cream  NutraDerm Therapeutic Skin Lotion  ProShield Protective Hand Cream  Provon moisturizing lotion    Incentive Spirometer  An incentive spirometer is a tool that can help keep your lungs clear and active. This tool measures how well you are filling your lungs with each breath. Taking long deep breaths may help reverse or decrease the chance of developing breathing (pulmonary) problems (especially infection) following: A long period of time when you are unable to move or be active. BEFORE THE PROCEDURE  If the spirometer includes an indicator to show your best effort, your nurse or respiratory therapist will set it to a desired goal. If possible, sit up straight or lean slightly forward. Try not to slouch. Hold the incentive spirometer in an upright position. INSTRUCTIONS FOR USE  Sit on the edge of your bed if possible, or sit up as far as you can in bed or on a chair. Hold the incentive spirometer in an upright position. Breathe out normally. Place the mouthpiece in your mouth and seal your lips tightly around it. Breathe in slowly and as deeply as possible, raising the piston or the ball toward the top of the column. Hold your breath for 3-5 seconds or for as long as possible. Allow the piston or ball to fall to the bottom of the column. Remove the  mouthpiece from your mouth and breathe out normally. Rest for a few seconds and repeat Steps 1 through 7 at least 10 times every 1-2 hours when you are awake. Take your time and take a few normal breaths between deep breaths. The spirometer may include an indicator to show your best effort. Use the indicator as a goal to work toward during each repetition. After each set of  10 deep breaths, practice coughing to be sure your lungs are clear. If you have an incision (the cut made at the time of surgery), support your incision when coughing by placing a pillow or rolled up towels firmly against it. Once you are able to get out of bed, walk around indoors and cough well. You may stop using the incentive spirometer when instructed by your caregiver.  RISKS AND COMPLICATIONS Take your time so you do not get dizzy or light-headed. If you are in pain, you may need to take or ask for pain medication before doing incentive spirometry. It is harder to take a deep breath if you are having pain. AFTER USE Rest and breathe slowly and easily. It can be helpful to keep track of a log of your progress. Your caregiver can provide you with a simple table to help with this. If you are using the spirometer at home, follow these instructions: SEEK MEDICAL CARE IF:  You are having difficultly using the spirometer. You have trouble using the spirometer as often as instructed. Your pain medication is not giving enough relief while using the spirometer. You develop fever of 100.5 F (38.1 C) or higher. SEEK IMMEDIATE MEDICAL CARE IF:  You cough up bloody sputum that had not been present before. You develop fever of 102 F (38.9 C) or greater. You develop worsening pain at or near the incision site. MAKE SURE YOU:  Understand these instructions. Will watch your condition. Will get help right away if you are not doing well or get worse. Document Released: 03/04/2007 Document Revised: 01/14/2012 Document Reviewed:  05/05/2007 [facility] Patient Information 2014 [place], [place].   ________________________________________________________________________

## 2023-08-29 ENCOUNTER — Other Ambulatory Visit: Payer: Self-pay | Admitting: Nurse Practitioner

## 2023-08-29 DIAGNOSIS — E782 Mixed hyperlipidemia: Secondary | ICD-10-CM | POA: Diagnosis not present

## 2023-08-29 DIAGNOSIS — M25562 Pain in left knee: Secondary | ICD-10-CM | POA: Diagnosis not present

## 2023-08-29 DIAGNOSIS — K219 Gastro-esophageal reflux disease without esophagitis: Secondary | ICD-10-CM | POA: Diagnosis not present

## 2023-08-29 DIAGNOSIS — I2699 Other pulmonary embolism without acute cor pulmonale: Secondary | ICD-10-CM | POA: Diagnosis not present

## 2023-08-29 DIAGNOSIS — G8929 Other chronic pain: Secondary | ICD-10-CM | POA: Diagnosis not present

## 2023-08-29 DIAGNOSIS — E119 Type 2 diabetes mellitus without complications: Secondary | ICD-10-CM | POA: Diagnosis not present

## 2023-08-29 DIAGNOSIS — Z79891 Long term (current) use of opiate analgesic: Secondary | ICD-10-CM | POA: Diagnosis not present

## 2023-08-29 DIAGNOSIS — M1711 Unilateral primary osteoarthritis, right knee: Secondary | ICD-10-CM | POA: Diagnosis not present

## 2023-08-29 DIAGNOSIS — M25561 Pain in right knee: Secondary | ICD-10-CM | POA: Diagnosis not present

## 2023-08-29 DIAGNOSIS — G894 Chronic pain syndrome: Secondary | ICD-10-CM | POA: Diagnosis not present

## 2023-08-29 DIAGNOSIS — K861 Other chronic pancreatitis: Secondary | ICD-10-CM | POA: Diagnosis not present

## 2023-08-29 NOTE — Progress Notes (Signed)
Sent message, via epic in basket, requesting orders in epic from surgeon.  

## 2023-08-30 DIAGNOSIS — M170 Bilateral primary osteoarthritis of knee: Secondary | ICD-10-CM | POA: Diagnosis not present

## 2023-09-04 ENCOUNTER — Encounter: Payer: Self-pay | Admitting: Internal Medicine

## 2023-09-04 ENCOUNTER — Other Ambulatory Visit: Payer: Self-pay

## 2023-09-04 DIAGNOSIS — M1712 Unilateral primary osteoarthritis, left knee: Secondary | ICD-10-CM | POA: Diagnosis not present

## 2023-09-04 MED ORDER — DEXCOM G6 SENSOR MISC: 1.0000 | 3 refills | Status: DC

## 2023-09-04 NOTE — H&P (Signed)
KNEE ARTHROPLASTY ADMISSION H&P  Patient ID: Shaun Odonnell MRN: [id number] DOB/AGE: [birth year] 57 y.o.  Chief Complaint: left knee pain.  Planned Procedure Date: 09/17/23  HPI: Shaun Odonnell is a 57 y.o. male who presents for evaluation of left knee OA. The patient has a history of pain and functional disability in the left knee due to arthritis and has failed non-surgical conservative treatments for greater than 12 weeks to include NSAID's and/or analgesics, corticosteriod injections, viscosupplementation injections, and activity modification.  Onset of symptoms was gradual, starting 10 years ago with gradually worsening course since that time. The patient noted no past surgery on the left knee.  Patient currently rates pain at 7 out of 10 with activity. Patient has worsening of pain with activity and weight bearing and pain that interferes with activities of daily living.  Patient has evidence of joint space narrowing by imaging studies.  There is no active infection.  Past Medical History:  Diagnosis Date   Alcohol abuse 04/29/2019   Allergy    Anxiety    ARDS (adult respiratory distress syndrome) (HCC)    Asthma    Bipolar disorder (HCC)    Cataract    Chronic kidney disease    per pt - no current problems   COPD (chronic obstructive pulmonary disease) (HCC)    chronic cough and wheezing   Depression    Diabetes mellitus without complication (HCC)    type 2   Dizziness    medication related   Dyspnea    with exertion   Elevated lipids    Fatty liver    Hypercholesterolemia    Hypertension    per pt, no current prob-no meds   Pancreatitis    Pneumonia    in past   Pulmonary embolism (HCC)    Schizoaffective disorder (HCC)    Past Surgical History:  Procedure Laterality Date   BIOPSY  08/31/2021   Procedure: BIOPSY;  Surgeon: Lemar Lofty., MD;  Location: [facility] ENDOSCOPY;  Service: Gastroenterology;;   CATARACT EXTRACTION W/PHACO Right 03/26/2018    Procedure: CATARACT EXTRACTION PHACO AND INTRAOCULAR LENS PLACEMENT (IOC) RIGHT BORDERLINE DIABETIC;  Surgeon: Lockie Mola, MD;  Location: [facility] SURGERY CNTR;  Service: Ophthalmology;  Laterality: Right;   CATARACT EXTRACTION W/PHACO Left 04/23/2018   Procedure: CATARACT EXTRACTION PHACO AND INTRAOCULAR LENS PLACEMENT (IOC)  LEFT BORDERLINE DIABETIC;  Surgeon: Lockie Mola, MD;  Location: [name] SURGERY CNTR;  Service: Ophthalmology;  Laterality: Left;   COLONOSCOPY     COLONOSCOPY WITH PROPOFOL N/A 08/21/2017   Procedure: COLONOSCOPY WITH PROPOFOL;  Surgeon: Scot Jun, MD;  Location[facility] END[facility]COPY;  Service: Endoscopy;  Laterality: N/A;   COLONOSCOPY WITH PROPOFOL N/A 12/15/2021   Procedure: COLONOSCOPY WITH BIOPSY;  Surgeon: Midge Minium, MD;  Location: [facility] SURGERY CNTR;  Service: Endoscopy;  Laterality: N/A;  Diabetic   ENDOSCOPIC RETROGRADE CHOLANGIOPANCREATOGRAPHY (ERCP) WITH PROPOFOL N/A 08/31/2021   Procedure: ENDOSCOPIC RETROGRADE CHOLANGIOPANCREATOGRAPHY (ERCP) WITH PROPOFOL;  Surgeon: Meridee Score Netty Starring., MD;  Location: [facility] ENDOSCOPY;  Service: Gastroenterology;  Laterality: N/A;   ESOPHAGOGASTRODUODENOSCOPY (EGD) WITH PROPOFOL N/A 08/31/2021   Procedure: ESOPHAGOGASTRODUODENOSCOPY (EGD) WITH PROPOFOL;  Surgeon: Meridee Score Netty Starring., MD;  Location: [facility] ENDOSCOPY;  Service: Gastroenterology;  Laterality: N/A;   EYE SURGERY     FINE NEEDLE ASPIRATION N/A 08/31/2021   Procedure: FINE NEEDLE ASPIRATION (FNA) LINEAR;  Surgeon: Lemar Lofty., MD;  Location: [facility] ENDOSCOPY;  Service: Gastroenterology;  Laterality: N/A;   HERNIA REPAIR     inguinal and umbilical  POLYPECTOMY N/A 12/15/2021   Procedure: POLYPECTOMY;  Surgeon: Midge Minium, MD;  Location: [facility] SURGERY CNTR;  Service: Endoscopy;  Laterality: N/A;   RIB RESECTION N/A 08/20/2019   Procedure: removal of xyphoid process;  Surgeon: Hulda Marin, MD;  Location: [facility] ORS;  Service: Thoracic;   Laterality: N/A;   SPHINCTEROTOMY  08/31/2021   Procedure: Dennison Mascot;  Surgeon: Mansouraty, Netty Starring., MD;  Location: [facility] ENDOSCOPY;  Service: Gastroenterology;;   STENT REMOVAL     UMBILICAL HERNIA REPAIR N/A 06/22/2019   Procedure: OPEN HERNIA REPAIR UMBILICAL ADULT;  Surgeon: Henrene Dodge, MD;  Location: [facility] ORS;  Service: General;  Laterality: N/A;   UPPER ESOPHAGEAL ENDOSCOPIC ULTRASOUND (EUS) N/A 08/31/2021   Procedure: UPPER ESOPHAGEAL ENDOSCOPIC ULTRASOUND (EUS);  Surgeon: Lemar Lofty., MD;  Location: [facility] ENDOSCOPY;  Service: Gastroenterology;  Laterality: N/A;   UPPER GI ENDOSCOPY     Allergies  Allergen Reactions   Fentanyl Nausea And Vomiting   Morphine And Codeine Shortness Of Breath   Neurontin [Gabapentin] Other (See Comments)    Caused severe over sedation.  Loss of faculties   Other Other (See Comments)    Pt chemically resistant to Hydromorphone, needs a 2 MG (OR MORE) dosage  Hydromorphone works better and is the most tolerable med for Pt    Clonazepam Other (See Comments)    "Dystonic"; alprazolam is a home med    Pimozide Other (See Comments)    "Distonic" Orat*   Trifluoperazine Other (See Comments)    "Distonic" Welflu5*   Azithromycin Other (See Comments)    Was told not to take Z pak due to his heart   Ketamine Other (See Comments)    Outer Body Experience - Euphoric Feeling not welcomed by Pt    Morphine Other (See Comments)    " I stop breathing"; can take Diluadid Cannot tolerate d/t Drug Metabolism   Nsaids     Pt does not remember reaction - Takes Blood Thinners    Singulair [Montelukast]     headache, nausea, feeling a little anxious, panic   Hydrocodone Anxiety and Palpitations    Vicodin*   Tramadol Nausea Only   Prior to Admission medications   Medication Sig Start Date End Date Taking? Authorizing Provider  ALPRAZolam Prudy Feeler) 0.5 MG tablet Take 0.5 mg by mouth 3 (three) times daily. 06/26/18  Yes [provider]   Cholecalciferol (VITAMIN D-3) 25 MCG (1000 UT) CAPS Take 1,000 Units by mouth daily at 8 pm.   Yes [provider]  ELIQUIS 2.5 MG TABS tablet Take 2.5 mg by mouth 2 (two) times daily. 08/20/23  Yes [provider]  empagliflozin (JARDIANCE) 25 MG TABS tablet Take 1 tablet (25 mg total) by mouth daily. 07/29/23  Yes Shamleffer, Konrad Dolores, MD  fenofibrate (TRICOR) 145 MG tablet Take 1 tablet (145 mg total) by mouth daily. 09/06/22  Yes Kara Dies, NP  HYDROmorphone (DILAUDID) 4 MG tablet Take 1 tablet (4 mg total) by mouth every 6 (six) hours as needed for severe pain. Patient taking differently: Take 4 mg by mouth every 6 (six) hours. 01/24/23  Yes Midge Minium, MD  insulin glargine, 2 Unit Dial, (TOUJEO MAX SOLOSTAR) 300 UNIT/ML Solostar Pen Inject 60 Units into the skin daily in the afternoon. Patient taking differently: Inject 60 Units into the skin every morning. 07/29/23  Yes Shamleffer, Konrad Dolores, MD  Insulin Regular Human (NOVOLIN R FLEXPEN RELION) 100 UNIT/ML KwikPen Inject 10-20 Units as directed 3 (three) times daily with meals.  Yes [provider]  Multiple Vitamins-Minerals (CENTRUM SILVER 50+MEN PO) Take by mouth.   Yes [provider]  OLANZapine (ZYPREXA) 20 MG tablet Take 20 mg by mouth at bedtime. 04/04/21  Yes [provider]  omeprazole (PRILOSEC) 40 MG capsule TAKE 1 CAPSULE BY MOUTH ONCE DAILY 07/02/22  Yes Mansouraty, Netty Starring., MD  ondansetron (ZOFRAN-ODT) 8 MG disintegrating tablet Take 1 tablet (8 mg total) by mouth every 8 (eight) hours as needed for nausea or vomiting. 07/21/22  Yes Wieting, Richard, MD  rosuvastatin (CRESTOR) 40 MG tablet Take 1 tablet (40 mg total) by mouth daily. Patient taking differently: Take 40 mg by mouth at bedtime. 06/15/22  Yes Cannady, Jolene T, NP  sertraline (ZOLOFT) 100 MG tablet Take 100 mg by mouth daily.   Yes [provider]  apixaban (ELIQUIS) 5 MG TABS tablet Take  2.5 mg by mouth 2 (two) times daily.    [provider]  Continuous Glucose Sensor (DEXCOM G6 SENSOR) MISC 1 Device by Does not apply route See admin instructions. Change every 10 days 09/04/23   Shamleffer, Konrad Dolores, MD  Continuous Glucose Transmitter (DEXCOM G6 TRANSMITTER) MISC 1 Device by Other route as directed. Every 10 days 07/02/23   Shamleffer, Konrad Dolores, MD  glucose blood test strip Use as instructed 12/17/19   Trey Sailors, PA-C  insulin lispro (HUMALOG KWIKPEN) 100 UNIT/ML KwikPen Max Daily 45 units 07/29/23   Shamleffer, Konrad Dolores, MD  Insulin Pen Needle (ULTRACARE PEN NEEDLES) 32G X 4 MM MISC 1 Device by Other route in the morning, at noon, in the evening, and at bedtime. Use to inject insulin 4 times daily 07/29/23   Shamleffer, Konrad Dolores, MD  OneTouch Delica Lancets 33G MISC USE TO CHECK FASTING SUGAR TWICE DAILY 12/07/20   Trey Sailors, PA-C   Social History   Socioeconomic History   Marital status: Single    Spouse name: Not on file   Number of children: 0   Years of education: Not on file   Highest education level: Bachelor's degree (e.g., BA, AB, BS)  Occupational History   Occupation: disabled  Tobacco Use   Smoking status: Former    Current packs/day: 0.00    Average packs/day: 0.5 packs/day for 34.0 years (17.0 ttl pk-yrs)    Types: Cigarettes    Start date: 04/04/1988    Quit date: 04/04/2022    Years since quitting: 1.4   Smokeless tobacco: Former    Types: Snuff    Quit date: 1990   Tobacco comments:    Still vaping daily.  Stopped smoking.  07/05/2022  Vaping Use   Vaping status: Every Day   Substances: Nicotine, Flavoring  Substance and Sexual Activity   Alcohol use: Yes    Alcohol/week: 6.0 standard drinks of alcohol    Types: 6 Cans of beer per week    Comment: 3 beers 2x a week   Drug use: Not Currently   Sexual activity: Not Currently  Other Topics Concern   Not on file  Social History Narrative   Not on  file   Social Determinants of Health   Financial Resource Strain: Low Risk  (08/01/2022)   Received from [facility]   Overall Financial Resource Strain (CARDIA)    Difficulty of Paying Living Expenses: Not hard at all  Food Insecurity: No Food Insecurity (08/01/2022)   Received from [facility], [facility]   Hunger Vital Sign  Worried About Programme researcher, broadcasting/film/video in the Last Year: Never true    Ran Out of Food in the Last Year: Never true  Transportation Needs: No Transportation Needs (08/01/2022)   Received from [facility], [facility]   [facility] - Transportation    Lack of Transportation (Medical): No    Lack of Transportation (Non-Medical): No  Physical Activity: Inactive (11/16/2021)   Exercise Vital Sign    Days of Exercise per Week: 0 days    Minutes of Exercise per Session: 0 min  Stress: No Stress Concern Present (11/16/2021)   [employer] of Occupational Health - Occupational Stress Questionnaire    Feeling of Stress : Not at all  Social Connections: Unknown (11/16/2021)   Social Connection and Isolation Panel [NHANES]    Frequency of Communication with Friends and Family: More than three times a week    Frequency of Social Gatherings with Friends and Family: Three times a week    Attends Religious Services: More than 4 times per year    Active Member of Clubs or Organizations: No    Attends Banker Meetings: Never    Marital Status: Not on file  Recent Concern: Social Connections - Moderately Isolated (11/16/2021)   Social Connection and Isolation Panel [NHANES]    Frequency of Communication with Friends and Family: More than three times a week    Frequency of Social Gatherings with Friends and Family: Three times a week    Attends Religious Services: More than 4 times per year    Active Member of Clubs or Organizations: No    Attends Banker Meetings: Never    Marital Status: Never married   Family  History  Problem Relation Age of Onset   Hyperlipidemia Mother    Hypertension Father    Stroke Father    Diabetes Maternal Aunt    Dementia Maternal Grandmother    Heart disease Maternal Grandfather    Heart disease Paternal Grandmother    Stroke Paternal Grandfather    Diabetes Paternal Grandfather    Colon cancer Neg Hx    Esophageal cancer Neg Hx    Inflammatory bowel disease Neg Hx    Liver disease Neg Hx    Pancreatic cancer Neg Hx    Stomach cancer Neg Hx    Rectal cancer Neg Hx     ROS: Currently denies lightheadedness, dizziness, Fever, chills, CP, SOB.   No personal history of DVT, MI, or CVA. Hx of PE on Eliquis No loose teeth or dentures All other systems have been reviewed and were otherwise currently negative with the exception of those mentioned in the HPI and as above.  Objective: Vitals: Ht: 5\' 8"  Wt: 197.3 lbs Temp: 98.9 BP: 14/90 Pulse: 84 O2 95% on room air.   Physical Exam: General: Alert, NAD.  Antalgic Gait  HEENT: EOMI, Good Neck Extension  Pulm: No increased work of breathing.  Clear B/L A/P w/o crackle or wheeze.  CV: RRR, No m/g/r appreciated  GI: soft, NT, ND Neuro: Neuro without gross focal deficit.  Sensation intact distally Skin: No lesions in the area of chief complaint MSK/Surgical Site: left knee w/o redness or effusion.  10-100 ROM. no JLT.  5/5 strength in extension and flexion.  +EHL/FHL.  NVI.  Stable varus and valgus stress.    Imaging Review Plain radiographs demonstrate severe degenerative joint disease of the left knee.   The overall alignment issignificant varus. The bone quality appears to be adequate  for age and reported activity level.  Preoperative templating of the joint replacement has been completed, documented, and submitted to the Operating Room personnel in order to optimize intra-operative equipment management.  Assessment: left knee OA Active Problems:   * No active hospital problems. *   Plan: Plan for  Procedure(s): UNICOMPARTMENTAL KNEE  The patient history, physical exam, clinical judgement of the provider and imaging are consistent with end stage degenerative joint disease and unicompartmental joint arthroplasty is deemed medically necessary. The treatment options including medical management, injection therapy, and arthroplasty were discussed at length. The risks and benefits of Procedure(s): UNICOMPARTMENTAL KNEE were presented and reviewed.  The risks of nonoperative treatment, versus surgical intervention including but not limited to continued pain, aseptic loosening, stiffness, dislocation/subluxation, infection, bleeding, nerve injury, blood clots, cardiopulmonary complications, morbidity, mortality, among others were discussed. The patient verbalizes understanding and wishes to proceed with the plan.  Patient is being admitted for inpatient treatment for surgery, pain control, PT, prophylactic antibiotics, VTE prophylaxis, progressive ambulation, ADL's and discharge planning.   The patient does not meet the criteria for TXA which will be used perioperatively.   Home Eliquis  will be used postoperatively for DVT prophylaxis in addition to SCDs, and early ambulation. All post-operative home pain medication will be managed by his pain medicine physician The patient is planning to be discharged home in care of family  Armida Sans, Cordelia Poche 09/04/2023 2:49 PM

## 2023-09-05 ENCOUNTER — Encounter (HOSPITAL_COMMUNITY): Payer: Self-pay

## 2023-09-05 ENCOUNTER — Encounter (HOSPITAL_COMMUNITY)
Admission: RE | Admit: 2023-09-05 | Discharge: 2023-09-05 | Disposition: A | Discharge status: Home / Self Care | Payer: 59 | Source: Ambulatory Visit | Attending: Orthopedic Surgery | Admitting: Orthopedic Surgery

## 2023-09-05 ENCOUNTER — Other Ambulatory Visit: Payer: Self-pay

## 2023-09-05 VITALS — BP 128/87 | HR 82 | Temp 98.6°F | Resp 18 | Ht 68.0 in | Wt 197.4 lb

## 2023-09-05 DIAGNOSIS — E1165 Type 2 diabetes mellitus with hyperglycemia: Secondary | ICD-10-CM | POA: Diagnosis not present

## 2023-09-05 DIAGNOSIS — R9431 Abnormal electrocardiogram [ECG] [EKG]: Secondary | ICD-10-CM | POA: Insufficient documentation

## 2023-09-05 DIAGNOSIS — Z01812 Encounter for preprocedural laboratory examination: Secondary | ICD-10-CM | POA: Diagnosis present

## 2023-09-05 DIAGNOSIS — Z01818 Encounter for other preprocedural examination: Secondary | ICD-10-CM | POA: Diagnosis not present

## 2023-09-05 DIAGNOSIS — Z01810 Encounter for preprocedural cardiovascular examination: Secondary | ICD-10-CM | POA: Diagnosis present

## 2023-09-05 HISTORY — DX: Unspecified osteoarthritis, unspecified site: M19.90

## 2023-09-05 HISTORY — DX: Gastro-esophageal reflux disease without esophagitis: K21.9

## 2023-09-05 LAB — SURGICAL PCR SCREEN
MRSA, PCR: NEGATIVE
Staphylococcus aureus: NEGATIVE

## 2023-09-05 LAB — BASIC METABOLIC PANEL
Anion gap: 16 — ABNORMAL HIGH (ref 5–15)
BUN: 15 mg/dL (ref 6–20)
CO2: 26 mmol/L (ref 22–32)
Calcium: 9.2 mg/dL (ref 8.9–10.3)
Chloride: 94 mmol/L — ABNORMAL LOW (ref 98–111)
Creatinine, Ser: 0.65 mg/dL (ref 0.61–1.24)
GFR, Estimated: >60 mL/min (ref >60)
Glucose, Bld: 151 mg/dL — ABNORMAL HIGH (ref 70–99)
Potassium: 3.7 mmol/L (ref 3.5–5.1)
Sodium: 136 mmol/L (ref 135–145)

## 2023-09-05 LAB — CBC
HCT: 47.2 % (ref 39.0–52.0)
Hemoglobin: 15.2 g/dL (ref 13.0–17.0)
MCH: 30.5 pg (ref 26.0–34.0)
MCHC: 32.2 g/dL (ref 30.0–36.0)
MCV: 94.6 fL (ref 80.0–100.0)
Platelets: 235 10*3/uL (ref 150–400)
RBC: 4.99 MIL/uL (ref 4.22–5.81)
RDW: 12.3 % (ref 11.5–15.5)
WBC: 6.5 10*3/uL (ref 4.0–10.5)
nRBC: 0 % (ref 0.0–0.2)

## 2023-09-05 LAB — GLUCOSE, CAPILLARY: Glucose-Capillary: 218 mg/dL — ABNORMAL HIGH (ref 70–99)

## 2023-09-09 DIAGNOSIS — M25562 Pain in left knee: Secondary | ICD-10-CM | POA: Diagnosis not present

## 2023-09-09 DIAGNOSIS — M25561 Pain in right knee: Secondary | ICD-10-CM | POA: Diagnosis not present

## 2023-09-09 DIAGNOSIS — Z79891 Long term (current) use of opiate analgesic: Secondary | ICD-10-CM | POA: Diagnosis not present

## 2023-09-09 DIAGNOSIS — G894 Chronic pain syndrome: Secondary | ICD-10-CM | POA: Diagnosis not present

## 2023-09-09 DIAGNOSIS — G8929 Other chronic pain: Secondary | ICD-10-CM | POA: Diagnosis not present

## 2023-09-09 DIAGNOSIS — K861 Other chronic pancreatitis: Secondary | ICD-10-CM | POA: Diagnosis not present

## 2023-09-09 NOTE — Care Plan (Signed)
Ortho Bundle Case Management Note  Patient Details  Name: SHO SALGUERO MRN: [id number] Date of Birth: [birth year]  met with patient in the office for H&P. he will discharge to home with his parents. His dad is basically homebound from a stroke. His mother will provide care. HHPT referral to [facility] and OPPT set up with [facility] in [place]. rolling walker ordered for home. pain will be managed by his pain management physician. no narcotics from our office. Patient and MD in agreement with plan. Choice offered                     DME Arranged:  Walker rolling DME Agency:  [employer]  HH Arranged:  PT HH Agency:  [facility]  Additional Comments: Please contact me with any questions of if this plan should need to change.  Shauna Hugh,  RN,BSN,MHA,CCM  [facility] Orthopaedic Specialist  [phone] 09/09/2023, 12:50 PM

## 2023-09-10 ENCOUNTER — Encounter (HOSPITAL_COMMUNITY): Payer: Self-pay | Admitting: Physician Assistant

## 2023-09-10 ENCOUNTER — Encounter (HOSPITAL_COMMUNITY): Payer: Self-pay

## 2023-09-17 ENCOUNTER — Ambulatory Visit (HOSPITAL_COMMUNITY): Admission: RE | Admit: 2023-09-17 | Payer: 59 | Source: Ambulatory Visit | Admitting: Orthopedic Surgery

## 2023-09-17 ENCOUNTER — Encounter (HOSPITAL_COMMUNITY): Admission: RE | Payer: Self-pay | Source: Ambulatory Visit

## 2023-09-17 SURGERY — ARTHROPLASTY, KNEE, UNICOMPARTMENTAL
Anesthesia: Spinal | Site: Knee | Laterality: Left

## 2023-09-18 ENCOUNTER — Other Ambulatory Visit: Payer: Self-pay | Admitting: Nurse Practitioner

## 2023-10-07 DIAGNOSIS — Z79891 Long term (current) use of opiate analgesic: Secondary | ICD-10-CM | POA: Diagnosis not present

## 2023-10-07 DIAGNOSIS — G894 Chronic pain syndrome: Secondary | ICD-10-CM | POA: Diagnosis not present

## 2023-10-24 ENCOUNTER — Other Ambulatory Visit: Payer: Self-pay

## 2023-10-24 ENCOUNTER — Encounter: Payer: Self-pay | Admitting: Internal Medicine

## 2023-10-24 MED ORDER — DEXCOM G6 TRANSMITTER MISC: 1.0000 | 3 refills | Status: DC

## 2023-11-27 NOTE — Progress Notes (Signed)
Sent message, via epic in basket, requesting orders in epic from surgeon.  

## 2023-11-28 NOTE — Patient Instructions (Addendum)
SURGICAL WAITING ROOM VISITATION Patients having surgery or a procedure may have no more than 2 support people in the waiting area - these visitors may rotate.    Children under the age of 66 must have an adult with them who is not the patient.  Due to an increase in RSV and influenza rates and associated hospitalizations, children ages 75 and under may not visit patients in [facility] hospitals.   If the patient needs to stay at the hospital during part of their recovery, the visitor guidelines for inpatient rooms apply. Pre-op nurse will coordinate an appropriate time for 1 support person to accompany patient in pre-op.  This support person may not rotate.    Please refer to the [facility] website for the visitor guidelines for Inpatients (after your surgery is over and you are in a regular room).       Your procedure is scheduled on: 12-17-23   Report to [facility] Main Entrance    Report to admitting at 7:15 AM   Call this number if you have problems the morning of surgery [phone]   Do not eat food :After Midnight.   After Midnight you may have the following liquids until 6:45 AM DAY OF SURGERY  Water Non-Citrus Juices (without pulp, NO RED-Apple, White grape, White cranberry) Black Coffee (NO MILK/CREAM OR CREAMERS, sugar ok)  Clear Tea (NO MILK/CREAM OR CREAMERS, sugar ok) regular and decaf                             Plain Jell-O (NO RED)                                           Fruit ices (not with fruit pulp, NO RED)                                     Popsicles (NO RED)                                                               Sports drinks like Gatorade (NO RED)                   The day of surgery:  Drink ONE (1) Pre-Surgery G2 by 6:45 AM the morning of surgery. Drink in one sitting. Do not sip.  This drink was given to you during your hospital  pre-op appointment visit. Nothing else to drink after completing the Pre-Surgery G2.          If  you have questions, please contact your surgeon's office.   FOLLOW  ANY ADDITIONAL PRE OP INSTRUCTIONS YOU RECEIVED FROM YOUR SURGEON'S OFFICE!!!     Oral Hygiene is also important to reduce your risk of infection.                                    Remember - BRUSH YOUR TEETH THE MORNING OF SURGERY WITH YOUR REGULAR TOOTHPASTE   Do  NOT smoke after Midnight   Take these medicines the morning of surgery with A SIP OF WATER:   Alprazolam  Fenofibrate  Omeprazole  Sertraline  Okay to use nasal spray  If needed Tylenol , Ondansetron  Stop all vitamins and herbal supplements 7 days before surgery  Hold Eliquis 2 days before surgery  (do not take after 12-14-23)  How to Manage Your Diabetes Before and After Surgery  Why is it important to control my blood sugar before and after surgery? Improving blood sugar levels before and after surgery helps healing and can limit problems. A way of improving blood sugar control is eating a healthy diet by:  Eating less sugar and carbohydrates  Increasing activity/exercise  Talking with your doctor about reaching your blood sugar goals High blood sugars (greater than 180 mg/dL) can raise your risk of infections and slow your recovery, so you will need to focus on controlling your diabetes during the weeks before surgery. Make sure that the doctor who takes care of your diabetes knows about your planned surgery including the date and location.  How do I manage my blood sugar before surgery? Check your blood sugar at least 4 times a day, starting 2 days before surgery, to make sure that the level is not too high or low. Check your blood sugar the morning of your surgery when you wake up and every 2 hours until you get to the Short Stay unit. If your blood sugar is less than 70 mg/dL, you will need to treat for low blood sugar: Do not take insulin. Treat a low blood sugar (less than 70 mg/dL) with  cup of clear juice (cranberry or apple), 4 glucose  tablets, OR glucose gel. Recheck blood sugar in 15 minutes after treatment (to make sure it is greater than 70 mg/dL). If your blood sugar is not greater than 70 mg/dL on recheck, call [phone] for further instructions. Report your blood sugar to the short stay nurse when you get to Short Stay.  If you are admitted to the hospital after surgery: Your blood sugar will be checked by the staff and you will probably be given insulin after surgery (instead of oral diabetes medicines) to make sure you have good blood sugar levels. The goal for blood sugar control after surgery is 80-180 mg/dL.   WHAT DO I DO ABOUT MY DIABETES MEDICATION?  Do not take oral diabetes medicines (pills) the morning of surgery.  Hold Jardiance 3 days before surgery (do not take after 12-13-23)  THE MORNING OF SURGERY, Toujeo 20 units insulin.  If your CBG is greater than 220 mg/dL, you may take  of your sliding scale  (correction) dose of Novolin insulin.  DO NOT TAKE THE FOLLOWING 7 DAYS PRIOR TO SURGERY: Ozempic, Wegovy, Rybelsus (Semaglutide), Byetta (exenatide), Bydureon (exenatide ER), Victoza, Saxenda (liraglutide), or Trulicity (dulaglutide) Mounjaro (Tirzepatide) Adlyxin (Lixisenatide), Polyethylene Glycol Loxenatide.   Reviewed and Endorsed by [facility] Patient Education Committee, August 2015                              You may not have any metal on your body including jewelry, and body piercing             Do not wear  lotions, powders, cologne, or deodorant              Men may shave face and neck.   Do not bring valuables to  the hospital. [facility] IS NOT RESPONSIBLE   FOR VALUABLES.   Contacts, dentures or bridgework may not be worn into surgery.  DO NOT BRING YOUR HOME MEDICATIONS TO THE HOSPITAL. PHARMACY WILL DISPENSE MEDICATIONS LISTED ON YOUR MEDICATION LIST TO YOU DURING YOUR ADMISSION IN THE HOSPITAL!    Patients discharged on the day of surgery will not be allowed to drive  home.  Someone NEEDS to stay with you for the first 24 hours after anesthesia.              Please read over the following fact sheets you were given: IF YOU HAVE QUESTIONS ABOUT YOUR PRE-OP INSTRUCTIONS PLEASE CALL [phone] Gwen  If you received a COVID test during your pre-op visit  it is requested that you wear a mask when out in public, stay away from anyone that may not be feeling well and notify your surgeon if you develop symptoms. If you test positive for Covid or have been in contact with anyone that has tested positive in the last 10 days please notify you surgeon.    Pre-operative 5 CHG Bath Instructions   You can play a key role in reducing the risk of infection after surgery. Your skin needs to be as free of germs as possible. You can reduce the number of germs on your skin by washing with CHG (chlorhexidine gluconate) soap before surgery. CHG is an antiseptic soap that kills germs and continues to kill germs even after washing.   DO NOT use if you have an allergy to chlorhexidine/CHG or antibacterial soaps. If your skin becomes reddened or irritated, stop using the CHG and notify one of our RNs at [phone].   Please shower with the CHG soap starting 4 days before surgery using the following schedule:     Please keep in mind the following:  DO NOT shave, including legs and underarms, starting the day of your first shower.   You may shave your face at any point before/day of surgery.  Place clean sheets on your bed the day you start using CHG soap. Use a clean washcloth (not used since being washed) for each shower. DO NOT sleep with pets once you start using the CHG.   CHG Shower Instructions:  If you choose to wash your hair and private area, wash first with your normal shampoo/soap.  After you use shampoo/soap, rinse your hair and body thoroughly to remove shampoo/soap residue.  Turn the water OFF and apply about 3 tablespoons (45 ml) of CHG soap to a CLEAN  washcloth.  Apply CHG soap ONLY FROM YOUR NECK DOWN TO YOUR TOES (washing for 3-5 minutes)  DO NOT use CHG soap on face, private areas, open wounds, or sores.  Pay special attention to the area where your surgery is being performed.  If you are having back surgery, having someone wash your back for you may be helpful. Wait 2 minutes after CHG soap is applied, then you may rinse off the CHG soap.  Pat dry with a clean towel  Put on clean clothes/pajamas   If you choose to wear lotion, please use ONLY the CHG-compatible lotions on the back of this paper.     Additional instructions for the day of surgery: DO NOT APPLY any lotions, deodorants, cologne, or perfumes.   Put on clean/comfortable clothes.  Brush your teeth.  Ask your nurse before applying any prescription medications to the skin.      CHG Compatible Lotions   Aveeno Moisturizing  lotion  Cetaphil Moisturizing Cream  Cetaphil Moisturizing Lotion  Clairol Herbal Essence Moisturizing Lotion, Dry Skin  Clairol Herbal Essence Moisturizing Lotion, Extra Dry Skin  Clairol Herbal Essence Moisturizing Lotion, Normal Skin  Curel Age Defying Therapeutic Moisturizing Lotion with Alpha Hydroxy  Curel Extreme Care Body Lotion  Curel Soothing Hands Moisturizing Hand Lotion  Curel Therapeutic Moisturizing Cream, Fragrance-Free  Curel Therapeutic Moisturizing Lotion, Fragrance-Free  Curel Therapeutic Moisturizing Lotion, Original Formula  Eucerin Daily Replenishing Lotion  Eucerin Dry Skin Therapy Plus Alpha Hydroxy Crme  Eucerin Dry Skin Therapy Plus Alpha Hydroxy Lotion  Eucerin Original Crme  Eucerin Original Lotion  Eucerin Plus Crme Eucerin Plus Lotion  Eucerin TriLipid Replenishing Lotion  Keri Anti-Bacterial Hand Lotion  Keri Deep Conditioning Original Lotion Dry Skin Formula Softly Scented  Keri Deep Conditioning Original Lotion, Fragrance Free Sensitive Skin Formula  Keri Lotion Fast Absorbing Fragrance Free Sensitive  Skin Formula  Keri Lotion Fast Absorbing Softly Scented Dry Skin Formula  Keri Original Lotion  Keri Skin Renewal Lotion Keri Silky Smooth Lotion  Keri Silky Smooth Sensitive Skin Lotion  Nivea Body Creamy Conditioning Oil  Nivea Body Extra Enriched Lotion  Nivea Body Original Lotion  Nivea Body Sheer Moisturizing Lotion Nivea Crme  Nivea Skin Firming Lotion  NutraDerm 30 Skin Lotion  NutraDerm Skin Lotion  NutraDerm Therapeutic Skin Cream  NutraDerm Therapeutic Skin Lotion  ProShield Protective Hand Cream  Provon moisturizing lotion   PATIENT SIGNATURE_________________________________  NURSE SIGNATURE__________________________________  ________________________________________________________________________    Rogelia Mire  An incentive spirometer is a tool that can help keep your lungs clear and active. This tool measures how well you are filling your lungs with each breath. Taking long deep breaths may help reverse or decrease the chance of developing breathing (pulmonary) problems (especially infection) following: A long period of time when you are unable to move or be active. BEFORE THE PROCEDURE  If the spirometer includes an indicator to show your best effort, your nurse or respiratory therapist will set it to a desired goal. If possible, sit up straight or lean slightly forward. Try not to slouch. Hold the incentive spirometer in an upright position. INSTRUCTIONS FOR USE  Sit on the edge of your bed if possible, or sit up as far as you can in bed or on a chair. Hold the incentive spirometer in an upright position. Breathe out normally. Place the mouthpiece in your mouth and seal your lips tightly around it. Breathe in slowly and as deeply as possible, raising the piston or the ball toward the top of the column. Hold your breath for 3-5 seconds or for as long as possible. Allow the piston or ball to fall to the bottom of the column. Remove the mouthpiece from  your mouth and breathe out normally. Rest for a few seconds and repeat Steps 1 through 7 at least 10 times every 1-2 hours when you are awake. Take your time and take a few normal breaths between deep breaths. The spirometer may include an indicator to show your best effort. Use the indicator as a goal to work toward during each repetition. After each set of 10 deep breaths, practice coughing to be sure your lungs are clear. If you have an incision (the cut made at the time of surgery), support your incision when coughing by placing a pillow or rolled up towels firmly against it. Once you are able to get out of bed, walk around indoors and cough well. You may stop using the incentive spirometer  when instructed by your caregiver.  RISKS AND COMPLICATIONS Take your time so you do not get dizzy or light-headed. If you are in pain, you may need to take or ask for pain medication before doing incentive spirometry. It is harder to take a deep breath if you are having pain. AFTER USE Rest and breathe slowly and easily. It can be helpful to keep track of a log of your progress. Your caregiver can provide you with a simple table to help with this. If you are using the spirometer at home, follow these instructions: SEEK MEDICAL CARE IF:  You are having difficultly using the spirometer. You have trouble using the spirometer as often as instructed. Your pain medication is not giving enough relief while using the spirometer. You develop fever of 100.5 F (38.1 C) or higher. SEEK IMMEDIATE MEDICAL CARE IF:  You cough up bloody sputum that had not been present before. You develop fever of 102 F (38.9 C) or greater. You develop worsening pain at or near the incision site. MAKE SURE YOU:  Understand these instructions. Will watch your condition. Will get help right away if you are not doing well or get worse. Document Released: 03/04/2007 Document Revised: 01/14/2012 Document Reviewed:  05/05/2007 [facility] Patient Information 2014 [place], [place].   ________________________________________________________________________

## 2023-12-03 NOTE — Progress Notes (Signed)
COVID Vaccine Completed:  Yes  Date of COVID positive in last 90 days:  PCP - Ardyth Gal, MD Cardiologist -  Arnoldo Hooker, MD (last OV 2019) Pulmonologist - Coralyn Helling, MD  Chest x-ray -  EKG - 09-05-23 Epic Stress Test - 06-10-18 [facility] ECHO - 01-04-15 [facility] Cardiac Cath -  Pacemaker/ICD device last checked: Spinal Cord Stimulator: Long Term Monitor - 06-19-21 Epic  Bowel Prep -   Sleep Study -  CPAP -   Fasting Blood Sugar -  Checks Blood Sugar _____ times a day  Last dose of GLP1 agonist-  N/A GLP1 instructions:  Hold 7 days before surgery    Jardiance  Last dose of SGLT-2 inhibitors- 12-13-23 SGLT-2 instructions:  Hold 3 days before surgery   Blood Thinner Instructions:  Eliquis Time Aspirin Instructions: Last Dose:  Activity level:  Can go up a flight of stairs and perform activities of daily living without stopping and without symptoms of chest pain or shortness of breath.  Able to exercise without symptoms  Unable to go up a flight of stairs without symptoms of     Anesthesia review:  SVT, HTN, DM, CKD, ARDS  Patient denies shortness of breath, fever, cough and chest pain at PAT appointment  Patient verbalized understanding of instructions that were given to them at the PAT appointment. Patient was also instructed that they will need to review over the PAT instructions again at home before surgery.

## 2023-12-04 ENCOUNTER — Encounter (HOSPITAL_COMMUNITY): Payer: Self-pay | Admitting: Medical

## 2023-12-04 ENCOUNTER — Other Ambulatory Visit: Payer: Self-pay

## 2023-12-04 ENCOUNTER — Encounter (HOSPITAL_COMMUNITY): Payer: Self-pay

## 2023-12-04 ENCOUNTER — Encounter (HOSPITAL_COMMUNITY)
Admission: RE | Admit: 2023-12-04 | Discharge: 2023-12-04 | Disposition: A | Discharge status: Home / Self Care | Payer: 59 | Source: Ambulatory Visit | Attending: Orthopedic Surgery | Admitting: Orthopedic Surgery

## 2023-12-04 VITALS — BP 149/85 | HR 69 | Temp 98.2°F | Resp 16 | Ht 68.0 in | Wt 200.4 lb

## 2023-12-04 DIAGNOSIS — Z794 Long term (current) use of insulin: Secondary | ICD-10-CM | POA: Diagnosis not present

## 2023-12-04 DIAGNOSIS — Z01818 Encounter for other preprocedural examination: Secondary | ICD-10-CM | POA: Diagnosis present

## 2023-12-04 DIAGNOSIS — Z01812 Encounter for preprocedural laboratory examination: Secondary | ICD-10-CM | POA: Diagnosis not present

## 2023-12-04 DIAGNOSIS — E119 Type 2 diabetes mellitus without complications: Secondary | ICD-10-CM | POA: Diagnosis not present

## 2023-12-04 DIAGNOSIS — K769 Liver disease, unspecified: Secondary | ICD-10-CM | POA: Insufficient documentation

## 2023-12-04 LAB — GLUCOSE, CAPILLARY: Glucose-Capillary: 219 mg/dL — ABNORMAL HIGH (ref 70–99)

## 2023-12-04 LAB — CBC
HCT: 51.1 % (ref 39.0–52.0)
Hemoglobin: 16.3 g/dL (ref 13.0–17.0)
MCH: 31.5 pg (ref 26.0–34.0)
MCHC: 31.9 g/dL (ref 30.0–36.0)
MCV: 98.8 fL (ref 80.0–100.0)
Platelets: 245 10*3/uL (ref 150–400)
RBC: 5.17 MIL/uL (ref 4.22–5.81)
RDW: 13.2 % (ref 11.5–15.5)
WBC: 5.3 10*3/uL (ref 4.0–10.5)
nRBC: 0 % (ref 0.0–0.2)

## 2023-12-04 LAB — COMPREHENSIVE METABOLIC PANEL
ALT: 23 U/L (ref 0–44)
AST: 19 U/L (ref 15–41)
Albumin: 4.6 g/dL (ref 3.5–5.0)
Alkaline Phosphatase: 84 U/L (ref 38–126)
Anion gap: 14 (ref 5–15)
BUN: 16 mg/dL (ref 6–20)
CO2: 22 mmol/L (ref 22–32)
Calcium: 9.5 mg/dL (ref 8.9–10.3)
Chloride: 99 mmol/L (ref 98–111)
Creatinine, Ser: 0.79 mg/dL (ref 0.61–1.24)
GFR, Estimated: >60 mL/min (ref >60)
Glucose, Bld: 227 mg/dL — ABNORMAL HIGH (ref 70–99)
Potassium: 4.4 mmol/L (ref 3.5–5.1)
Sodium: 135 mmol/L (ref 135–145)
Total Bilirubin: 0.9 mg/dL (ref 0.0–1.2)
Total Protein: 8.4 g/dL — ABNORMAL HIGH (ref 6.5–8.1)

## 2023-12-04 LAB — HEMOGLOBIN A1C
Hgb A1c MFr Bld: 8.7 % — ABNORMAL HIGH (ref 4.8–5.6)
Mean Plasma Glucose: 202.99 mg/dL

## 2023-12-04 LAB — SURGICAL PCR SCREEN
MRSA, PCR: NEGATIVE
Staphylococcus aureus: NEGATIVE

## 2023-12-05 NOTE — Care Plan (Signed)
Ortho Bundle Case Management Note  Patient Details  Name: Shaun Odonnell MRN: [id number] Date of Birth: September 19, 1966    met with patient in the office for H&P. he will discharge to home with his parents. His dad is basically homebound from a stroke. His mother will provide care. HHPT referral to [facility] and OPPT set up with [facility] in [place]. rolling walker ordered for home. pain will be managed by his pain management physician. no narcotics from our office. Patient and MD in agreement with plan. Choice offered                  DME Arranged:  Walker rolling DME Agency:  [employer]  HH Arranged:  PT HH Agency:  [facility]  Additional Comments: Please contact me with any questions of if this plan should need to change.  Shauna Hugh,  RN,BSN,MHA,CCM  [facility]  [phone] 12/05/2023, 8:02 AM

## 2023-12-06 DIAGNOSIS — G8929 Other chronic pain: Secondary | ICD-10-CM | POA: Diagnosis not present

## 2023-12-06 DIAGNOSIS — M25562 Pain in left knee: Secondary | ICD-10-CM | POA: Diagnosis not present

## 2023-12-06 DIAGNOSIS — G894 Chronic pain syndrome: Secondary | ICD-10-CM | POA: Diagnosis not present

## 2023-12-06 DIAGNOSIS — Z79891 Long term (current) use of opiate analgesic: Secondary | ICD-10-CM | POA: Diagnosis not present

## 2023-12-06 DIAGNOSIS — M25569 Pain in unspecified knee: Secondary | ICD-10-CM | POA: Diagnosis not present

## 2023-12-10 DIAGNOSIS — M170 Bilateral primary osteoarthritis of knee: Secondary | ICD-10-CM | POA: Diagnosis not present

## 2023-12-10 DIAGNOSIS — E782 Mixed hyperlipidemia: Secondary | ICD-10-CM | POA: Diagnosis not present

## 2023-12-10 DIAGNOSIS — E119 Type 2 diabetes mellitus without complications: Secondary | ICD-10-CM | POA: Diagnosis not present

## 2023-12-10 DIAGNOSIS — Z794 Long term (current) use of insulin: Secondary | ICD-10-CM | POA: Diagnosis not present

## 2023-12-17 ENCOUNTER — Ambulatory Visit (HOSPITAL_COMMUNITY): Admission: RE | Admit: 2023-12-17 | Payer: 59 | Source: Ambulatory Visit | Admitting: Orthopedic Surgery

## 2023-12-17 ENCOUNTER — Encounter (HOSPITAL_COMMUNITY): Admission: RE | Payer: Self-pay | Source: Ambulatory Visit

## 2023-12-17 SURGERY — ARTHROPLASTY, KNEE, UNICOMPARTMENTAL
Anesthesia: Spinal | Site: Knee | Laterality: Left

## 2024-01-01 ENCOUNTER — Ambulatory Visit: Payer: 59 | Admitting: Orthopedic Surgery

## 2024-01-15 ENCOUNTER — Ambulatory Visit: Payer: 59 | Admitting: Orthopedic Surgery

## 2024-01-23 ENCOUNTER — Ambulatory Visit: Admitting: Orthopedic Surgery

## 2024-01-27 ENCOUNTER — Ambulatory Visit: Payer: 59 | Admitting: Internal Medicine

## 2024-02-04 DEATH — deceased

## 2024-02-05 ENCOUNTER — Ambulatory Visit: Admitting: Internal Medicine

## 2024-02-05 ENCOUNTER — Encounter: Payer: Self-pay | Admitting: Internal Medicine

## 2024-02-05 NOTE — Progress Notes (Deleted)
 Name: Shaun Odonnell  Age/ Sex: 58 y.o., male   MRN/ DOB: [id number], 03-20-1966     PCP: Ardyth Gal, MD   Reason for Endocrinology Evaluation: Type 2 Diabetes Mellitus  Initial Endocrine Consultative Visit: 01/05/2022    PATIENT IDENTIFIER: [name] is a 58 y.o. male with a past medical history of T2DM, Hx chronic pancreatitis, schizoaffective disorder, Hx ETOH abuse. The patient has followed with Endocrinology clinic since 01/05/2022 for consultative assistance with management of his diabetes.  DIABETIC HISTORY:  [name] was diagnosed with DM 2006, and started insulin therapy in 2018. His hemoglobin A1c has ranged from 11.9% in 2023, peaking at 12.1% in 2022.   SUBJECTIVE:   During the last visit (07/29/2023): A1c 7.6%   Today (02/05/2024): [name] is here for follow-up on diabetes management.  He checks his blood sugars multiple  times daily, through CGM.   The patient had an A1c of 8.7% through PCPs office 12/04/2023  Pt follows with the hematology clinic for PE, which he was diagnosed with in 08/2022 during evaluation for acute pancreatitis.    He follows with his GI Dr. Ellison Carwin  Has nausea and insomnia that he attributes to opiate withdrawal  Awaiting knee sx  Denies constipation or diarrhea    HOME DIABETES REGIMEN:  Jardiance 25 mg daily Toujeo 60 units daily Humalog 10 units 3 times daily before every meal Correction factor: Novolin-R (BG -130/25)   Statin: Yes ACE-I/ARB: No  CONTINUOUS GLUCOSE MONITORING RECORD INTERPRETATION    Dates of Recording: 9/10-9/23/2024  Sensor description: Dexcom  Results statistics:   CGM use % of time 93%  Average and SD 195/73  Time in range  51%  % Time Above 180 29  % Time above 250 20  % Time Below target 0   Glycemic patterns summary: BGs fluctuate during the day and night  Hyperglycemic episodes postprandial  Hypoglycemic episodes occurred overnight  Overnight periods:  Variable    DIABETIC COMPLICATIONS: Microvascular complications:  Cataract sx  Denies: CKD, neuropathy  Last Eye Exam: Completed 06/2021  Macrovascular complications:   Denies: CAD, CVA, PVD   HISTORY:  Past Medical History:  Past Medical History:  Diagnosis Date   Alcohol abuse 04/29/2019   Allergy    Anxiety    ARDS (adult respiratory distress syndrome) (HCC)    Arthritis    Asthma    Bipolar disorder (HCC)    Cataract    Chronic kidney disease    per pt - no current problems  pt. denies   Depression    Diabetes mellitus without complication (HCC)    type 2   Dizziness    medication related   Elevated lipids    Fatty liver    GERD (gastroesophageal reflux disease)    Hypercholesterolemia    Hypertension    per pt, no current prob-no meds   Pancreatitis    Pneumonia    in past   Pulmonary embolism (HCC)    Schizoaffective disorder (HCC)    Past Surgical History:  Past Surgical History:  Procedure Laterality Date   BIOPSY  08/31/2021   Procedure: BIOPSY;  Surgeon: Meridee Score Netty Starring., MD;  Location[facility] END[facility]COPY;  Service: Gastroenterology;;   CATARACT EXTRACTION W/PHACO Right 03/26/2018   Procedure: CATARACT EXTRACTION PHACO AND INTRAOCULAR LENS PLACEMENT (IOC) RIGHT BORDERLINE DIABETIC;  Surgeon: Lockie Mola, MD;  Location: [facility] SURGERY CNTR;  Service: Ophthalmology;  Laterality: Right;   CATARACT EXTRACTION  W/PHACO Left 04/23/2018   Procedure: CATARACT EXTRACTION PHACO AND INTRAOCULAR LENS PLACEMENT (IOC)  LEFT BORDERLINE DIABETIC;  Surgeon: Lockie Mola, MD;  Location: [facility] SURGERY CNTR;  Service: Ophthalmology;  Laterality: Left;   COLONOSCOPY     COLONOSCOPY WITH PROPOFOL N/A 08/21/2017   Procedure: COLONOSCOPY WITH PROPOFOL;  Surgeon: Scot Jun, MD;  Location: [facility] ENDOSCOPY;  Service: Endoscopy;  Laterality: N/A;   COLONOSCOPY WITH PROPOFOL N/A 12/15/2021   Procedure: COLONOSCOPY WITH BIOPSY;  Surgeon: Midge Minium,  MD;  Location: [facility] SURGERY CNTR;  Service: Endoscopy;  Laterality: N/A;  Diabetic   ENDOSCOPIC RETROGRADE CHOLANGIOPANCREATOGRAPHY (ERCP) WITH PROPOFOL N/A 08/31/2021   Procedure: ENDOSCOPIC RETROGRADE CHOLANGIOPANCREATOGRAPHY (ERCP) WITH PROPOFOL;  Surgeon: Meridee Score Netty Starring., MD;  Location: [facility] ENDOSCOPY;  Service: Gastroenterology;  Laterality: N/A;   ESOPHAGOGASTRODUODENOSCOPY (EGD) WITH PROPOFOL N/A 08/31/2021   Procedure: ESOPHAGOGASTRODUODENOSCOPY (EGD) WITH PROPOFOL;  Surgeon: Meridee Score Netty Starring., MD;  Location: [facility] ENDOSCOPY;  Service: Gastroenterology;  Laterality: N/A;   EYE SURGERY     FINE NEEDLE ASPIRATION N/A 08/31/2021   Procedure: FINE NEEDLE ASPIRATION (FNA) LINEAR;  Surgeon: Lemar Lofty., MD;  Location: [facility] ENDOSCOPY;  Service: Gastroenterology;  Laterality: N/A;   HERNIA REPAIR     inguinal and umbilical   POLYPECTOMY N/A 12/15/2021   Procedure: POLYPECTOMY;  Surgeon: Midge Minium, MD;  Location: [facility] SURGERY CNTR;  Service: Endoscopy;  Laterality: N/A;   RIB RESECTION N/A 08/20/2019   Procedure: removal of xyphoid process;  Surgeon: Hulda Marin, MD;  Location: [facility] ORS;  Service: Thoracic;  Laterality: N/A;   SPHINCTEROTOMY  08/31/2021   Procedure: Dennison Mascot;  Surgeon: Mansouraty, Netty Starring., MD;  Location: [facility] ENDOSCOPY;  Service: Gastroenterology;;   STENT REMOVAL     for stone in pancreatic duct   UMBILICAL HERNIA REPAIR N/A 06/22/2019   Procedure: OPEN HERNIA REPAIR UMBILICAL ADULT;  Surgeon: Henrene Dodge, MD;  Location: [facility] ORS;  Service: General;  Laterality: N/A;   UPPER ESOPHAGEAL ENDOSCOPIC ULTRASOUND (EUS) N/A 08/31/2021   Procedure: UPPER ESOPHAGEAL ENDOSCOPIC ULTRASOUND (EUS);  Surgeon: Lemar Lofty., MD;  Location: [facility] END[facility]COPY;  Service: Gastroenterology;  Laterality: N/A;   UPPER GI ENDOSCOPY     Social History:  reports that he quit smoking about 22 months ago. His smoking use included cigarettes. He started smoking  about 35 years ago. He has a 17 pack-year smoking history. He quit smokeless tobacco use about 35 years ago.  His smokeless tobacco use included snuff. He reports that he does not currently use alcohol after a past usage of about 6.0 standard drinks of alcohol per week. He reports that he does not currently use drugs. Family History:  Family History  Problem Relation Age of Onset   Hyperlipidemia Mother    Hypertension Father    Stroke Father    Diabetes Maternal Aunt    Dementia Maternal Grandmother    Heart disease Maternal Grandfather    Heart disease Paternal Grandmother    Stroke Paternal Grandfather    Diabetes Paternal Grandfather    Colon cancer Neg Hx    Esophageal cancer Neg Hx    Inflammatory bowel disease Neg Hx    Liver disease Neg Hx    Pancreatic cancer Neg Hx    Stomach cancer Neg Hx    Rectal cancer Neg Hx      HOME MEDICATIONS: Allergies as of 02/05/2024       Reactions   Fentanyl Nausea And Vomiting   Morphine And Codeine Shortness Of Breath  Neurontin [gabapentin] Other (See Comments)   Caused severe over sedation.  Loss of faculties   Other Other (See Comments)   Pt chemically resistant to Hydromorphone, needs a 2 MG (OR MORE) dosage  Hydromorphone works better and is the most tolerable med for Pt    Clonazepam Other (See Comments)   "Dystonic"; alprazolam is a home med   Pimozide Other (See Comments)   "Distonic" Orat*   Trifluoperazine Other (See Comments)   "Distonic" Welflu5*   Azithromycin Other (See Comments)   Was told not to take Z pak due to his heart   Ketamine Other (See Comments)   Outer Body Experience - Euphoric Feeling not welcomed by Pt    Morphine Other (See Comments)   " I stop breathing"; can take Diluadid Cannot tolerate d/t Drug Metabolism   Nsaids    Pt does not remember reaction - Takes Blood Thinners    Singulair [montelukast]    headache, nausea, feeling a little anxious, panic   Hydrocodone Anxiety, Palpitations    Vicodin*   Tramadol Nausea Only        Medication List        Accurate as of February 05, 2024  6:56 AM. If you have any questions, ask your nurse or doctor.          acetaminophen 500 MG tablet Commonly known as: TYLENOL Take 1,000 mg by mouth every 6 (six) hours as needed (pain.).   ALPRAZolam 0.5 MG tablet Commonly known as: XANAX Take 0.5 mg by mouth 3 (three) times daily.   apixaban 2.5 MG Tabs tablet Commonly known as: ELIQUIS Take 2.5 mg by mouth 2 (two) times daily.   Azelastine-Fluticasone 137-50 MCG/ACT Susp Place 1 spray into both nostrils 2 (two) times daily as needed (seasonal allergies.).   Dexcom G6 Sensor Misc 1 Device by Does not apply route See admin instructions. Change every 10 days   Dexcom G6 Transmitter Misc 1 Device by Other route as directed. Every 10 days   empagliflozin 25 MG Tabs tablet Commonly known as: Jardiance Take 1 tablet (25 mg total) by mouth daily.   eszopiclone 2 MG Tabs tablet Commonly known as: LUNESTA Take 2 mg by mouth at bedtime as needed.   fenofibrate 145 MG tablet Commonly known as: TRICOR Take 1 tablet (145 mg total) by mouth daily.   glucose blood test strip Use as instructed   insulin lispro 100 UNIT/ML KwikPen Commonly known as: HumaLOG KwikPen Max Daily 45 units   NovoLIN R FlexPen ReliOn 100 UNIT/ML FlexPen Generic drug: Insulin Regular Human Inject 10-20 Units as directed 3 (three) times daily with meals.   OLANZapine 20 MG tablet Commonly known as: ZYPREXA Take 20 mg by mouth at bedtime.   omeprazole 40 MG capsule Commonly known as: PRILOSEC TAKE 1 CAPSULE BY MOUTH ONCE DAILY   ondansetron 8 MG disintegrating tablet Commonly known as: ZOFRAN-ODT Take 1 tablet (8 mg total) by mouth every 8 (eight) hours as needed for nausea or vomiting.   OneTouch Delica Lancets 33G Misc USE TO CHECK FASTING SUGAR TWICE DAILY   rosuvastatin 40 MG tablet Commonly known as: CRESTOR Take 1 tablet (40 mg total)  by mouth daily. What changed: when to take this   sertraline 100 MG tablet Commonly known as: ZOLOFT Take 100 mg by mouth in the morning.   Toujeo Max SoloStar 300 UNIT/ML Solostar Pen Generic drug: insulin glargine (2 Unit Dial) Inject 60 Units into the skin daily in the afternoon. What  changed: when to take this   Ultracare Pen Needles 32G X 4 MM Misc Generic drug: Insulin Pen Needle 1 Device by Other route in the morning, at noon, in the evening, and at bedtime. Use to inject insulin 4 times daily         OBJECTIVE:   Vital Signs: There were no vitals taken for this visit.  Wt Readings from Last 3 Encounters:  12/04/23 200 lb 6.4 oz (90.9 kg)  09/05/23 197 lb 6.4 oz (89.5 kg)  07/29/23 204 lb (92.5 kg)     Exam: General: Pt appears well and is in NAD  Chest : CTA  Heart: RRR  Neuro: MS is good with appropriate affect, pt is alert and Ox3   DM Foot Exam 07/29/2023  The skin of the feet is intact without sores or ulcerations. The pedal pulses are 2+ on right and 2+ on left. The sensation is intact to a screening 5.07, 10 gram monofilament bilaterally    DATA REVIEWED:  Lab Results  Component Value Date   HGBA1C 8.7 (H) 12/04/2023   HGBA1C 7.6 (A) 07/29/2023   HGBA1C 7.9 (A) 05/31/2022    Latest Reference Range & Units 12/04/23 10:43  Sodium 135 - 145 mmol/L 135  Potassium 3.5 - 5.1 mmol/L 4.4  Chloride 98 - 111 mmol/L 99  CO2 22 - 32 mmol/L 22  Glucose 70 - 99 mg/dL 161 (H)  Mean Plasma Glucose mg/dL 096.04  BUN 6 - 20 mg/dL 16  Creatinine 5.40 - 9.81 mg/dL 1.91  Calcium 8.9 - 47.8 mg/dL 9.5  Anion gap 5 - 15  14  Alkaline Phosphatase 38 - 126 U/L 84  Albumin 3.5 - 5.0 g/dL 4.6  AST 15 - 41 U/L 19  ALT 0 - 44 U/L 23  Total Protein 6.5 - 8.1 g/dL 8.4 (H)  Total Bilirubin 0.0 - 1.2 mg/dL 0.9  GFR, Estimated >29 mL/min >60    Old records , labs and images have been reviewed.    ASSESSMENT / PLAN / RECOMMENDATIONS:   1) Type 2 Diabetes  Mellitus, Sub-Optimally controlled, Without complications -  A1c 7.6 %, Goal A1c < 7.0 %.     -A1c continues to trend down but remains above goal -Patient has Hx of pancreatitis, hence not a candidate for DPP 4 inhibitors, GLP-1 agonist nor Mounjaro -In reviewing his CGM data, patient has been noted with hypoglycemia overnight, I will decrease Toujeo as below -I will also switch Novolin R to Humalog, he was encouraged to continue take it before each meal and use correction scale as needed  MEDICATIONS: Continue Jardiance 25 mg daily Decrease Toujeo 60 units daily Switch Novolin-R to Humalog , take 10 units 3 times daily before every meal Continue Correction factor: Humalog (BG -130/25) TIDQAC  EDUCATION / INSTRUCTIONS: BG monitoring instructions: Patient is instructed to check his blood sugars 3 times a day, before each meal. Call [facility] if: BG persistently < 70  I reviewed the Rule of 15 for the treatment of hypoglycemia in detail with the patient. Literature supplied.   2) Diabetic complications:  Eye: Does not have known diabetic retinopathy.  Neuro/ Feet: Does not have known diabetic peripheral neuropathy .  Renal: Patient does not have known baseline CKD. He   is not on an ACEI/ARB at present.   F/U in 6 months   Signed electronically by: Lyndle Herrlich, MD  [facility] Endocrinology  [facility] [address] [place] [place],  [place] [address] Phone: [phone] FAX: [phone]   CC: Ardyth Gal, MD [address] [place] [facility] [place] [address] Phone: [phone]  Fax: [phone]  Return to Endocrinology clinic as below: Future Appointments  Date Time Provider Department Center  02/05/2024  9:50 AM Malike Foglio, Konrad Dolores, MD [id number] None
